# Patient Record
Sex: Male | Born: 1960 | Race: Black or African American | Hispanic: No | Marital: Single | Smoking: Current every day smoker
Health system: Southern US, Community
[De-identification: ages and names within clinical notes are randomized; demographics above are authoritative.]

## PROBLEM LIST (undated history)

## (undated) DIAGNOSIS — R569 Unspecified convulsions: Secondary | ICD-10-CM

## (undated) DIAGNOSIS — Z931 Gastrostomy status: Secondary | ICD-10-CM

## (undated) DIAGNOSIS — E46 Unspecified protein-calorie malnutrition: Secondary | ICD-10-CM

## (undated) DIAGNOSIS — M75101 Unspecified rotator cuff tear or rupture of right shoulder, not specified as traumatic: Secondary | ICD-10-CM

## (undated) DIAGNOSIS — Z7901 Long term (current) use of anticoagulants: Secondary | ICD-10-CM

## (undated) DIAGNOSIS — D649 Anemia, unspecified: Secondary | ICD-10-CM

## (undated) DIAGNOSIS — E785 Hyperlipidemia, unspecified: Secondary | ICD-10-CM

## (undated) DIAGNOSIS — J449 Chronic obstructive pulmonary disease, unspecified: Secondary | ICD-10-CM

## (undated) DIAGNOSIS — Z720 Tobacco use: Secondary | ICD-10-CM

## (undated) DIAGNOSIS — I700 Atherosclerosis of aorta: Secondary | ICD-10-CM

## (undated) DIAGNOSIS — K219 Gastro-esophageal reflux disease without esophagitis: Secondary | ICD-10-CM

## (undated) DIAGNOSIS — Z9889 Other specified postprocedural states: Secondary | ICD-10-CM

## (undated) DIAGNOSIS — Z9981 Dependence on supplemental oxygen: Secondary | ICD-10-CM

## (undated) DIAGNOSIS — E119 Type 2 diabetes mellitus without complications: Secondary | ICD-10-CM

## (undated) DIAGNOSIS — J45909 Unspecified asthma, uncomplicated: Secondary | ICD-10-CM

## (undated) DIAGNOSIS — R490 Dysphonia: Secondary | ICD-10-CM

## (undated) DIAGNOSIS — Z8669 Personal history of other diseases of the nervous system and sense organs: Secondary | ICD-10-CM

## (undated) DIAGNOSIS — R7303 Prediabetes: Secondary | ICD-10-CM

## (undated) DIAGNOSIS — I82409 Acute embolism and thrombosis of unspecified deep veins of unspecified lower extremity: Secondary | ICD-10-CM

## (undated) DIAGNOSIS — M7581 Other shoulder lesions, right shoulder: Secondary | ICD-10-CM

## (undated) DIAGNOSIS — R0600 Dyspnea, unspecified: Secondary | ICD-10-CM

## (undated) DIAGNOSIS — R911 Solitary pulmonary nodule: Secondary | ICD-10-CM

## (undated) HISTORY — PX: PEG TUBE REMOVAL: SHX2187

## (undated) HISTORY — PX: COLONOSCOPY: SHX5424

## (undated) HISTORY — PX: CHEST TUBE INSERTION: SHX231

---

## 2020-07-24 LAB — COLOGUARD: COLOGUARD: NEGATIVE

## 2021-06-20 ENCOUNTER — Encounter: Payer: Self-pay | Admitting: Orthopaedic Surgery

## 2021-07-11 ENCOUNTER — Ambulatory Visit: Payer: Medicare Other

## 2021-07-11 ENCOUNTER — Encounter: Payer: Self-pay | Admitting: Orthopedic Surgery

## 2021-07-11 ENCOUNTER — Ambulatory Visit (INDEPENDENT_AMBULATORY_CARE_PROVIDER_SITE_OTHER): Payer: Medicare Other | Admitting: Orthopedic Surgery

## 2021-07-11 ENCOUNTER — Other Ambulatory Visit: Payer: Self-pay

## 2021-07-11 VITALS — BP 118/73 | HR 89 | Ht 72.0 in | Wt 133.0 lb

## 2021-07-11 DIAGNOSIS — M25511 Pain in right shoulder: Secondary | ICD-10-CM | POA: Diagnosis not present

## 2021-07-11 DIAGNOSIS — G8929 Other chronic pain: Secondary | ICD-10-CM | POA: Diagnosis not present

## 2021-07-11 NOTE — Patient Instructions (Addendum)

## 2021-07-12 ENCOUNTER — Encounter: Payer: Self-pay | Admitting: Orthopedic Surgery

## 2021-07-12 NOTE — Progress Notes (Addendum)
New Patient Visit  Assessment: Benjamin Bolton is a 60 y.o. male with the following: 1. Chronic right shoulder pain  Plan: Patient's had progressively worsening pain in the right shoulder for several months.  He has had multiple injections, but I believe these to be intramuscular steroid injections.  These are provided him with 1-2 days of relief.  He has limited range of motion as a result.  We discussed the possibility of proceeding with an intra-articular steroid injection, and he is elected to proceed.  If he does not fact have an injury to his rotator cuff tendons, we will have to seriously consider proceeding with an MRI as he is a high risk surgical candidate.  In clinic today, he is on chronic oxygen therapy due to COPD.  All questions were answered, and he is amenable to this plan.  He is hopeful that the injection will provide him with sustained relief.  Procedure note injection - Right shoulder    Verbal consent was obtained to inject the right shoulder, subacromial space Timeout was completed to confirm the site of injection.   The skin was prepped with alcohol and ethyl chloride was sprayed at the injection site.  A 21-gauge needle was used to inject 40 mg of Depo-Medrol and 1% lidocaine (3 cc) into the subacromial space of the right shoulder using a posterolateral approach.  There were no complications.  A sterile bandage was applied.      Follow-up: Return if symptoms worsen or fail to improve.  Subjective:  Chief Complaint  Patient presents with   Shoulder Pain    Rt shoulder pain getting worse over the past 7 months. NKI    History of Present Illness: Benjamin Bolton is a 60 y.o. male who presents for evaluation of right shoulder pain.  He has been referred to clinic today by Sharlene Dory, NP.  Pain has been progressively worsening over the past 7 months.  No specific injury.  Is difficult to be with overhead motion.  His pain gets worse at night.  Pain is in the  lateral aspect of the shoulder, and radiates distally.  He is trying to over-the-counter medications, limited improvement in his symptoms.  He has had multiple injections, which I believe are intramuscular, which provided limited relief for 1-2 days.   Review of Systems: No fevers or chills No numbness or tingling No chest pain No bowel or bladder dysfunction No GI distress No headaches   Medical History:  COPD.  Chronic oxygen therapy  History reviewed. No pertinent surgical history.  History reviewed. No pertinent family history. Social History   Tobacco Use   Smoking status: Former    Types: Cigarettes   Smokeless tobacco: Never    No Known Allergies  Current Meds  Medication Sig   albuterol (VENTOLIN HFA) 108 (90 Base) MCG/ACT inhaler Inhale into the lungs.   fluticasone-salmeterol (ADVAIR) 250-50 MCG/ACT AEPB Inhale into the lungs.   tiotropium (SPIRIVA) 18 MCG inhalation capsule Place into inhaler and inhale.    Objective: BP 118/73   Pulse 89   Ht 6' (1.829 m)   Wt 133 lb (60.3 kg)   BMI 18.04 kg/m   Physical Exam:  General: Alert and oriented. and No acute distress. Gait: Normal gait.  Right shoulder without deformity.  No obvious atrophy.  Limited forward elevation to 90 degrees.  Abduction is side to 75 degrees.  He has difficulty getting his arm to his lower back.  Pain with strength testing.  Tenderness palpation  over the lateral shoulder.  IMAGING: I personally ordered and reviewed the following images  X-rays of the right shoulder were obtained in clinic today and demonstrates no acute injury.  There are some degenerative changes noted within the glenohumeral joint.  Small osteophytes in the inferior aspect of the joint.  No proximal humeral migration.  No evidence of chronic rotator cuff injury.  Impression: Right shoulder x-ray with mild degenerative changes.   New Medications:  No orders of the defined types were placed in this  encounter.     Oliver Barre, MD  07/12/2021 1:04 PM

## 2021-07-20 ENCOUNTER — Ambulatory Visit: Payer: Medicare Other | Admitting: Orthopedic Surgery

## 2021-07-25 ENCOUNTER — Other Ambulatory Visit: Payer: Self-pay

## 2021-07-25 ENCOUNTER — Encounter: Payer: Self-pay | Admitting: Orthopedic Surgery

## 2021-07-25 ENCOUNTER — Ambulatory Visit (INDEPENDENT_AMBULATORY_CARE_PROVIDER_SITE_OTHER): Payer: Medicare Other | Admitting: Orthopedic Surgery

## 2021-07-25 VITALS — BP 104/72 | HR 85 | Ht 72.0 in | Wt 133.0 lb

## 2021-07-25 DIAGNOSIS — M25511 Pain in right shoulder: Secondary | ICD-10-CM

## 2021-07-25 DIAGNOSIS — G8929 Other chronic pain: Secondary | ICD-10-CM | POA: Diagnosis not present

## 2021-07-25 MED ORDER — CYCLOBENZAPRINE HCL 10 MG PO TABS: 10.0000 mg | ORAL_TABLET | Freq: Two times a day (BID) | ORAL | 0 refills | Status: DC | PRN

## 2021-07-25 NOTE — Patient Instructions (Signed)

## 2021-07-25 NOTE — Progress Notes (Signed)
Orthopaedic Clinic Return  Assessment: Benjamin Bolton is a 60 y.o. male with the following: Chronic right shoulder pain; rotator cuff injury versus glenohumeral arthritis  Plan: Patient was seen in clinic approximately 2 weeks ago, at which time he had a subacromial steroid injection.  He noted improvement in his discomfort for 1-2 days.  Since then, his pain is significantly worsened.  He is in severe pain, and is requesting something be done.  I discussed our limitations, and our options.  After discussing this, he is interested in proceeding with an ultrasound-guided injection.  This was completed in clinic today.  Have also provided him with Flexeril to help with the muscle spasms, particularly to address the pain in the posterior aspect of his shoulder.  Once again, I stressed that he is a very high risk surgical candidate.  He may in fact have pathology which would be amenable to surgery, but we have to be very cautious in proceeding with surgery.  He would need clearance due to his advanced COPD, requiring oxygen at baseline.  He stated his understanding.  Procedure note injection - Right shoulder, ultrasound guidance   Verbal consent was obtained to inject the right shoulder, glenohumeral joint  Timeout was completed to confirm the site of injection.   Using the ultrasound, the rotator cuff tendons were identified.  The joint space was also identified. The skin was prepped with alcohol and ethyl chloride was sprayed at the injection site.  A 21-gauge needle was used to inject 40 mg of Depo-Medrol and 1% lidocaine (6 cc) into the glenohumeral joint space of the right shoulder using a posterolateral approach.  The needle was visualized entering the glenohumeral joint, and the medication was also visualized. There were no complications.  A sterile bandage was applied.   Note: In order to accurately identify the placement of the needle, ultrasound was required, to increase the accuracy, and  specificity of the injection.   Follow-up: Return if symptoms worsen or fail to improve.   Subjective:  Chief Complaint  Patient presents with   Shoulder Pain    Rt shoulder states pain has gotten worse and the shot only last 1-2 days.     History of Present Illness: Benjamin Bolton is a 60 y.o. male who returns to clinic for repeat evaluation of right shoulder pain.  He was seen in clinic approximately 2 weeks ago.  At that time, he had a subacromial steroid injection.  He noted improvement in his pain for 1-2 days.  Since then, his pain has progressively worsened.  He states his pain is worse now than it was when he came here previously.  He has been taking ibuprofen, with minimal relief in his symptoms.  He has some oxycodone from the emergency department, which he is taking occasionally.  Review of Systems: No fevers or chills No numbness or tingling No chest pain No bowel or bladder dysfunction No GI distress No headaches Shortness of breath, on oxygen at baseline  Objective: BP 104/72   Pulse 85   Ht 6' (1.829 m)   Wt 133 lb (60.3 kg)   BMI 18.04 kg/m  - Physical Exam:  Thin male.  No acute distress.  O2 therapy via nasal cannula.  Restricted right shoulder range of motion.  Pain with minimal motion.  Abduction to 70 degrees.  Forward flexion to 90 degrees.  Internal rotation to the side of his hip.  External rotation limited to 20 degrees.  IMAGING: I personally ordered and reviewed  the following images:  No new imaging obtained today.  Oliver Barre, MD 07/25/2021 11:06 AM

## 2021-07-30 ENCOUNTER — Telehealth: Payer: Self-pay | Admitting: Orthopedic Surgery

## 2021-07-30 MED ORDER — TRAMADOL HCL 50 MG PO TABS: 50.0000 mg | ORAL_TABLET | Freq: Four times a day (QID) | ORAL | 0 refills | Status: DC | PRN

## 2021-07-30 NOTE — Telephone Encounter (Signed)
Patient asking if he may have medication prescribed for his shoulder pain - states that the injection has not helped with the pain. If so, states uses [facility] on [address], [place]

## 2021-08-15 ENCOUNTER — Telehealth: Payer: Self-pay | Admitting: Orthopedic Surgery

## 2021-08-15 MED ORDER — TRAMADOL HCL 50 MG PO TABS: 50.0000 mg | ORAL_TABLET | Freq: Four times a day (QID) | ORAL | 0 refills | Status: DC | PRN

## 2021-08-15 NOTE — Telephone Encounter (Signed)
Patient called for refill - said shoulder is still hurting - no follow up visit scheduled per last visit's instructions unless symptoms worsen or fail to improve: Please advise.   traMADol (ULTRAM) 50 MG tablet 30 tablet     [facility], [place], [address]

## 2021-09-02 DIAGNOSIS — J189 Pneumonia, unspecified organism: Secondary | ICD-10-CM

## 2021-09-02 HISTORY — DX: Pneumonia, unspecified organism: J18.9

## 2021-09-11 ENCOUNTER — Other Ambulatory Visit: Payer: Self-pay | Admitting: Physical Medicine & Rehabilitation

## 2021-09-11 DIAGNOSIS — M542 Cervicalgia: Secondary | ICD-10-CM

## 2021-09-20 ENCOUNTER — Other Ambulatory Visit: Payer: Self-pay

## 2021-09-20 ENCOUNTER — Ambulatory Visit
Admission: RE | Admit: 2021-09-20 | Discharge: 2021-09-20 | Disposition: A | Discharge status: Home / Self Care | Payer: Medicare Other | Source: Ambulatory Visit | Attending: Physical Medicine & Rehabilitation | Admitting: Physical Medicine & Rehabilitation

## 2021-09-20 DIAGNOSIS — M542 Cervicalgia: Secondary | ICD-10-CM | POA: Insufficient documentation

## 2021-11-13 ENCOUNTER — Other Ambulatory Visit: Payer: Self-pay | Admitting: Specialist

## 2021-11-13 ENCOUNTER — Other Ambulatory Visit (HOSPITAL_COMMUNITY): Payer: Self-pay | Admitting: Specialist

## 2021-11-13 DIAGNOSIS — R911 Solitary pulmonary nodule: Secondary | ICD-10-CM

## 2021-11-13 DIAGNOSIS — R0609 Other forms of dyspnea: Secondary | ICD-10-CM

## 2021-12-03 ENCOUNTER — Encounter (HOSPITAL_COMMUNITY): Payer: Medicare Other

## 2021-12-03 ENCOUNTER — Encounter (HOSPITAL_COMMUNITY): Payer: Self-pay

## 2022-01-03 ENCOUNTER — Ambulatory Visit (HOSPITAL_COMMUNITY)
Admission: RE | Admit: 2022-01-03 | Discharge: 2022-01-03 | Disposition: A | Discharge status: Home / Self Care | Payer: Medicare Other | Source: Ambulatory Visit | Attending: Specialist | Admitting: Specialist

## 2022-01-03 DIAGNOSIS — R0609 Other forms of dyspnea: Secondary | ICD-10-CM | POA: Diagnosis present

## 2022-01-03 DIAGNOSIS — R911 Solitary pulmonary nodule: Secondary | ICD-10-CM | POA: Insufficient documentation

## 2022-01-03 LAB — GLUCOSE, CAPILLARY: Glucose-Capillary: 147 mg/dL — ABNORMAL HIGH (ref 70–99)

## 2022-01-03 MED ORDER — FLUDEOXYGLUCOSE F - 18 (FDG) INJECTION
7.0000 | Freq: Once | INTRAVENOUS | Status: AC | PRN
  Administered 2022-01-03: 6.6 via INTRAVENOUS

## 2022-05-29 ENCOUNTER — Other Ambulatory Visit
Admission: RE | Admit: 2022-05-29 | Discharge: 2022-05-29 | Disposition: A | Discharge status: Home / Self Care | Payer: Medicare Other | Source: Ambulatory Visit | Attending: Specialist | Admitting: Specialist

## 2022-05-29 DIAGNOSIS — R053 Chronic cough: Secondary | ICD-10-CM | POA: Diagnosis present

## 2022-05-29 DIAGNOSIS — R0789 Other chest pain: Secondary | ICD-10-CM | POA: Diagnosis present

## 2022-05-29 DIAGNOSIS — J4541 Moderate persistent asthma with (acute) exacerbation: Secondary | ICD-10-CM | POA: Insufficient documentation

## 2022-05-29 LAB — D-DIMER, QUANTITATIVE: D-Dimer, Quant: 0.35 ug/mL-FEU (ref 0.00–0.50)

## 2022-11-19 ENCOUNTER — Other Ambulatory Visit: Payer: Self-pay

## 2022-11-19 DIAGNOSIS — M7581 Other shoulder lesions, right shoulder: Secondary | ICD-10-CM

## 2023-01-01 ENCOUNTER — Ambulatory Visit
Admission: RE | Admit: 2023-01-01 | Discharge: 2023-01-01 | Disposition: A | Discharge status: Home / Self Care | Payer: 59 | Source: Ambulatory Visit | Attending: Physician Assistant | Admitting: Physician Assistant

## 2023-01-01 DIAGNOSIS — M7581 Other shoulder lesions, right shoulder: Secondary | ICD-10-CM | POA: Diagnosis present

## 2023-02-05 ENCOUNTER — Other Ambulatory Visit: Payer: Self-pay | Admitting: Surgery

## 2023-02-12 ENCOUNTER — Encounter
Admission: RE | Admit: 2023-02-12 | Discharge: 2023-02-12 | Disposition: A | Discharge status: Home / Self Care | Payer: 59 | Source: Ambulatory Visit | Attending: Surgery | Admitting: Surgery

## 2023-02-12 ENCOUNTER — Encounter: Admission: RE | Admit: 2023-02-12 | Payer: 59 | Source: Ambulatory Visit

## 2023-02-12 VITALS — Ht 73.0 in | Wt 128.0 lb

## 2023-02-12 DIAGNOSIS — Z01810 Encounter for preprocedural cardiovascular examination: Secondary | ICD-10-CM

## 2023-02-12 DIAGNOSIS — J449 Chronic obstructive pulmonary disease, unspecified: Secondary | ICD-10-CM

## 2023-02-12 HISTORY — DX: Dependence on supplemental oxygen: Z99.81

## 2023-02-12 HISTORY — DX: Solitary pulmonary nodule: R91.1

## 2023-02-12 HISTORY — DX: Personal history of other diseases of the nervous system and sense organs: Z86.69

## 2023-02-12 HISTORY — DX: Tobacco use: Z72.0

## 2023-02-12 HISTORY — DX: Gastro-esophageal reflux disease without esophagitis: K21.9

## 2023-02-12 HISTORY — DX: Unspecified rotator cuff tear or rupture of right shoulder, not specified as traumatic: M75.101

## 2023-02-12 HISTORY — DX: Prediabetes: R73.03

## 2023-02-12 HISTORY — DX: Chronic obstructive pulmonary disease, unspecified: J44.9

## 2023-02-12 HISTORY — DX: Dyspnea, unspecified: R06.00

## 2023-02-12 NOTE — Patient Instructions (Signed)
Your procedure is scheduled on:02-18-23 Tuesday Report to the Registration Desk on the 1st floor of the [facility].Then proceed to the 2nd floor Surgery Desk To find out your arrival time, please call [phone] between 1PM - 3PM on:02-17-23 Monday If your arrival time is 6:00 am, do not arrive before that time as the [facility] entrance doors do not open until 6:00 am.  REMEMBER: Instructions that are not followed completely may result in serious medical risk, up to and including death; or upon the discretion of your surgeon and anesthesiologist your surgery may need to be rescheduled.  Do not eat food after midnight the night before surgery.  No gum chewing or hard candies.  You may however, drink CLEAR liquids up to 2 hours before you are scheduled to arrive for your surgery. Do not drink anything within 2 hours of your scheduled arrival time.  Clear liquids include: - water  - apple juice without pulp - gatorade (not RED colors) - black coffee or tea (Do NOT add milk or creamers to the coffee or tea) Do NOT drink anything that is not on this list.  In addition, your doctor has ordered for you to drink the provided:  Ensure Pre-Surgery Clear Carbohydrate Drink  Drinking this carbohydrate drink up to two hours before surgery helps to reduce insulin resistance and improve patient outcomes. Please complete drinking 2 hours before scheduled arrival time.  One week prior to surgery: Stop Anti-inflammatories (NSAIDS) such as Advil, Aleve, Ibuprofen, Motrin, Naproxen, Naprosyn and Aspirin based products such as Excedrin, Goody's Powder, BC Powder.You may however, take Tylenol if needed for pain up until the day of surgery. Stop ANY OVER THE COUNTER supplements/vitamins NOW (02-12-23) until after surgery (Multivitamin)  TAKE ONLY THESE MEDICATIONS THE MORNING OF SURGERY WITH A SIP OF WATER: -predniSONE (DELTASONE)   Use your Albuterol Nebulizer and your TRELEGY ELLIPTA Inhaler the day  of surgery  No Alcohol for 24 hours before or after surgery.  No Smoking including e-cigarettes for 24 hours before surgery.  No chewable tobacco products for at least 6 hours before surgery.  No nicotine patches on the day of surgery.  Do not use any "recreational" drugs for at least a week (preferably 2 weeks) before your surgery.  Please be advised that the combination of cocaine and anesthesia may have negative outcomes, up to and including death. If you test positive for cocaine, your surgery will be cancelled.  On the morning of surgery brush your teeth with toothpaste and water, you may rinse your mouth with mouthwash if you wish. Do not swallow any toothpaste or mouthwash.  Use CHG Soap as directed on instruction sheet.  Do not wear jewelry, make-up, hairpins, clips or nail polish.  Do not wear lotions, powders, or perfumes.   Do not shave body hair from the neck down 48 hours before surgery.  Contact lenses, hearing aids and dentures may not be worn into surgery.  Do not bring valuables to the hospital. [facility] is not responsible for any missing/lost belongings or valuables.  Notify your doctor if there is any change in your medical condition (cold, fever, infection).  Wear comfortable clothing (specific to your surgery type) to the hospital.  After surgery, you can help prevent lung complications by doing breathing exercises.  Take deep breaths and cough every 1-2 hours. Your doctor may order a device called an Incentive Spirometer to help you take deep breaths. When coughing or sneezing, hold a pillow firmly against your  incision with both hands. This is called "splinting." Doing this helps protect your incision. It also decreases belly discomfort.  If you are being admitted to the hospital overnight, leave your suitcase in the car. After surgery it may be brought to your room.  In case of increased patient census, it may be necessary for you, the patient, to  continue your postoperative care in the Same Day Surgery department.  If you are being discharged the day of surgery, you will not be allowed to drive home. You will need a responsible individual to drive you home and stay with you for 24 hours after surgery.   If you are taking public transportation, you will need to have a responsible individual with you.  Please call the Pre-admissions Testing Dept. at [phone] if you have any questions about these instructions.  Surgery Visitation Policy:  Patients having surgery or a procedure may have two visitors.  Children under the age of 5 must have an adult with them who is not the patient.  Inpatient Visitation:    Visiting hours are 7 a.m. to 8 p.m. Up to four visitors are allowed at one time in a patient room. The visitors may rotate out with other people during the day.  One visitor age 70 or older may stay with the patient overnight and must be in the room by 8 p.m.     Preparing for Surgery with CHLORHEXIDINE GLUCONATE (CHG) Soap  Chlorhexidine Gluconate (CHG) Soap  o An antiseptic cleaner that kills germs and bonds with the skin to continue killing germs even after washing  o Used for showering the night before surgery and morning of surgery  Before surgery, you can play an important role by reducing the number of germs on your skin.  CHG (Chlorhexidine gluconate) soap is an antiseptic cleanser which kills germs and bonds with the skin to continue killing germs even after washing.  Please do not use if you have an allergy to CHG or antibacterial soaps. If your skin becomes reddened/irritated stop using the CHG.  1. Shower the NIGHT BEFORE SURGERY and the MORNING OF SURGERY with CHG soap.  2. If you choose to wash your hair, wash your hair first as usual with your normal shampoo.  3. After shampooing, rinse your hair and body thoroughly to remove the shampoo.  4. Use CHG as you would any other liquid soap. You can  apply CHG directly to the skin and wash gently with a scrungie or a clean washcloth.  5. Apply the CHG soap to your body only from the neck down. Do not use on open wounds or open sores. Avoid contact with your eyes, ears, mouth, and genitals (private parts). Wash face and genitals (private parts) with your normal soap.  6. Wash thoroughly, paying special attention to the area where your surgery will be performed.  7. Thoroughly rinse your body with warm water.  8. Do not shower/wash with your normal soap after using and rinsing off the CHG soap.  9. Pat yourself dry with a clean towel.  10. Wear clean pajamas to bed the night before surgery.  12. Place clean sheets on your bed the night of your first shower and do not sleep with pets.  13. Shower again with the CHG soap on the day of surgery prior to arriving at the hospital.  14. Do not apply any deodorants/lotions/powders.  15. Please wear clean clothes to the hospital.  How to Use an Incentive Spirometer An  incentive spirometer is a tool that measures how well you are filling your lungs with each breath. Learning to take long, deep breaths using this tool can help you keep your lungs clear and active. This may help to reverse or lessen your chance of developing breathing (pulmonary) problems, especially infection. You may be asked to use a spirometer: After a surgery. If you have a lung problem or a history of smoking. After a long period of time when you have been unable to move or be active. If the spirometer includes an indicator to show the highest number that you have reached, your health care provider or respiratory therapist will help you set a goal. Keep a log of your progress as told by your health care provider. What are the risks? Breathing too quickly may cause dizziness or cause you to pass out. Take your time so you do not get dizzy or light-headed. If you are in pain, you may need to take pain medicine before doing  incentive spirometry. It is harder to take a deep breath if you are having pain. How to use your incentive spirometer  Sit up on the edge of your bed or on a chair. Hold the incentive spirometer so that it is in an upright position. Before you use the spirometer, breathe out normally. Place the mouthpiece in your mouth. Make sure your lips are closed tightly around it. Breathe in slowly and as deeply as you can through your mouth, causing the piston or the ball to rise toward the top of the chamber. Hold your breath for 3-5 seconds, or for as long as possible. If the spirometer includes a coach indicator, use this to guide you in breathing. Slow down your breathing if the indicator goes above the marked areas. Remove the mouthpiece from your mouth and breathe out normally. The piston or ball will return to the bottom of the chamber. Rest for a few seconds, then repeat the steps 10 or more times. Take your time and take a few normal breaths between deep breaths so that you do not get dizzy or light-headed. Do this every 1-2 hours when you are awake. If the spirometer includes a goal marker to show the highest number you have reached (best effort), use this as a goal to work toward during each repetition. After each set of 10 deep breaths, cough a few times. This will help to make sure that your lungs are clear. If you have an incision on your chest or abdomen from surgery, place a pillow or a rolled-up towel firmly against the incision when you cough. This can help to reduce pain while taking deep breaths and coughing. General tips When you are able to get out of bed: Walk around often. Continue to take deep breaths and cough in order to clear your lungs. Keep using the incentive spirometer until your health care provider says it is okay to stop using it. If you have been in the hospital, you may be told to keep using the spirometer at home. Contact a health care provider if: You are having  difficulty using the spirometer. You have trouble using the spirometer as often as instructed. Your pain medicine is not giving enough relief for you to use the spirometer as told. You have a fever. Get help right away if: You develop shortness of breath. You develop a cough with bloody mucus from the lungs. You have fluid or blood coming from an incision site after you cough. Summary An  incentive spirometer is a tool that can help you learn to take long, deep breaths to keep your lungs clear and active. You may be asked to use a spirometer after a surgery, if you have a lung problem or a history of smoking, or if you have been inactive for a long period of time. Use your incentive spirometer as instructed every 1-2 hours while you are awake. If you have an incision on your chest or abdomen, place a pillow or a rolled-up towel firmly against your incision when you cough. This will help to reduce pain. Get help right away if you have shortness of breath, you cough up bloody mucus, or blood comes from your incision when you cough. This information is not intended to replace advice given to you by your health care provider. Make sure you discuss any questions you have with your health care provider. Document Revised: 11/08/2019 Document Reviewed: 11/08/2019 Elsevier Patient Education  2024 [employer].

## 2023-02-17 MED ORDER — FAMOTIDINE 20 MG PO TABS
20.0000 mg | ORAL_TABLET | Freq: Once | ORAL | Status: AC
  Administered 2023-02-18: 20 mg via ORAL

## 2023-02-17 MED ORDER — ORAL CARE MOUTH RINSE: 15.0000 mL | Freq: Once | OROMUCOSAL | Status: AC

## 2023-02-17 MED ORDER — CEFAZOLIN SODIUM-DEXTROSE 2-4 GM/100ML-% IV SOLN
2.0000 g | INTRAVENOUS | Status: AC
  Administered 2023-02-18: 2 g via INTRAVENOUS

## 2023-02-17 MED ORDER — CHLORHEXIDINE GLUCONATE 0.12 % MT SOLN
15.0000 mL | Freq: Once | OROMUCOSAL | Status: AC
  Administered 2023-02-18: 15 mL via OROMUCOSAL

## 2023-02-17 MED ORDER — LACTATED RINGERS IV SOLN: INTRAVENOUS | Status: DC

## 2023-02-18 ENCOUNTER — Ambulatory Visit: Payer: 59 | Admitting: Certified Registered Nurse Anesthetist

## 2023-02-18 ENCOUNTER — Encounter
Admission: RE | Disposition: A | Discharge status: Home / Self Care | Payer: Self-pay | Source: Home / Self Care | Attending: Surgery

## 2023-02-18 ENCOUNTER — Encounter: Payer: Self-pay | Admitting: Surgery

## 2023-02-18 ENCOUNTER — Other Ambulatory Visit: Payer: Self-pay

## 2023-02-18 ENCOUNTER — Ambulatory Visit
Admission: RE | Admit: 2023-02-18 | Discharge: 2023-02-19 | Disposition: A | Discharge status: Home / Self Care | Payer: 59 | Attending: Surgery | Admitting: Surgery

## 2023-02-18 DIAGNOSIS — M75121 Complete rotator cuff tear or rupture of right shoulder, not specified as traumatic: Secondary | ICD-10-CM

## 2023-02-18 DIAGNOSIS — Z833 Family history of diabetes mellitus: Secondary | ICD-10-CM | POA: Diagnosis not present

## 2023-02-18 DIAGNOSIS — Z01810 Encounter for preprocedural cardiovascular examination: Secondary | ICD-10-CM

## 2023-02-18 DIAGNOSIS — M67813 Other specified disorders of tendon, right shoulder: Secondary | ICD-10-CM | POA: Diagnosis not present

## 2023-02-18 DIAGNOSIS — Z9981 Dependence on supplemental oxygen: Secondary | ICD-10-CM | POA: Insufficient documentation

## 2023-02-18 DIAGNOSIS — Z9889 Other specified postprocedural states: Secondary | ICD-10-CM

## 2023-02-18 DIAGNOSIS — J432 Centrilobular emphysema: Secondary | ICD-10-CM | POA: Insufficient documentation

## 2023-02-18 DIAGNOSIS — R7303 Prediabetes: Secondary | ICD-10-CM | POA: Diagnosis not present

## 2023-02-18 DIAGNOSIS — K219 Gastro-esophageal reflux disease without esophagitis: Secondary | ICD-10-CM | POA: Insufficient documentation

## 2023-02-18 DIAGNOSIS — F17210 Nicotine dependence, cigarettes, uncomplicated: Secondary | ICD-10-CM | POA: Insufficient documentation

## 2023-02-18 DIAGNOSIS — J449 Chronic obstructive pulmonary disease, unspecified: Secondary | ICD-10-CM

## 2023-02-18 DIAGNOSIS — M7541 Impingement syndrome of right shoulder: Secondary | ICD-10-CM | POA: Insufficient documentation

## 2023-02-18 DIAGNOSIS — M7521 Bicipital tendinitis, right shoulder: Secondary | ICD-10-CM | POA: Diagnosis not present

## 2023-02-18 DIAGNOSIS — M7581 Other shoulder lesions, right shoulder: Secondary | ICD-10-CM | POA: Diagnosis not present

## 2023-02-18 DIAGNOSIS — I38 Endocarditis, valve unspecified: Secondary | ICD-10-CM | POA: Diagnosis not present

## 2023-02-18 HISTORY — PX: SHOULDER ARTHROSCOPY WITH SUBACROMIAL DECOMPRESSION, ROTATOR CUFF REPAIR AND BICEP TENDON REPAIR: SHX5687

## 2023-02-18 SURGERY — SHOULDER ARTHROSCOPY WITH SUBACROMIAL DECOMPRESSION, ROTATOR CUFF REPAIR AND BICEP TENDON REPAIR
Anesthesia: General | Site: Shoulder | Laterality: Right

## 2023-02-18 MED ORDER — PREDNISONE 10 MG PO TABS
20.0000 mg | ORAL_TABLET | Freq: Every day | ORAL | Status: DC
  Administered 2023-02-19: 20 mg via ORAL
  Filled 2023-02-18: qty 2

## 2023-02-18 MED ORDER — FLEET ENEMA 7-19 GM/118ML RE ENEM: 1.0000 | ENEMA | Freq: Once | RECTAL | Status: DC | PRN

## 2023-02-18 MED ORDER — SUGAMMADEX SODIUM 200 MG/2ML IV SOLN
INTRAVENOUS | Status: DC | PRN
  Administered 2023-02-18: 200 mg via INTRAVENOUS

## 2023-02-18 MED ORDER — KETOROLAC TROMETHAMINE 15 MG/ML IJ SOLN
INTRAMUSCULAR | Status: AC
  Filled 2023-02-18: qty 1

## 2023-02-18 MED ORDER — BUPIVACAINE HCL (PF) 0.5 % IJ SOLN
INTRAMUSCULAR | Status: AC
  Filled 2023-02-18: qty 30

## 2023-02-18 MED ORDER — OXYCODONE HCL 5 MG PO TABS
5.0000 mg | ORAL_TABLET | ORAL | Status: DC | PRN
  Administered 2023-02-18 – 2023-02-19 (×2): 10 mg via ORAL

## 2023-02-18 MED ORDER — ACETAMINOPHEN 500 MG PO TABS
ORAL_TABLET | ORAL | Status: AC
  Filled 2023-02-18: qty 2

## 2023-02-18 MED ORDER — OXYCODONE HCL 5 MG PO TABS: 5.0000 mg | ORAL_TABLET | Freq: Once | ORAL | Status: DC | PRN

## 2023-02-18 MED ORDER — ONDANSETRON HCL 4 MG/2ML IJ SOLN: 4.0000 mg | Freq: Once | INTRAMUSCULAR | Status: DC | PRN

## 2023-02-18 MED ORDER — BUPIVACAINE-EPINEPHRINE 0.5% -1:200000 IJ SOLN
INTRAMUSCULAR | Status: DC | PRN
  Administered 2023-02-18: 30 mL

## 2023-02-18 MED ORDER — PHENYLEPHRINE HCL (PRESSORS) 10 MG/ML IV SOLN
INTRAVENOUS | Status: AC
  Filled 2023-02-18: qty 1

## 2023-02-18 MED ORDER — DIPHENHYDRAMINE HCL 12.5 MG/5ML PO ELIX: 12.5000 mg | ORAL_SOLUTION | ORAL | Status: DC | PRN

## 2023-02-18 MED ORDER — ONDANSETRON HCL 4 MG/2ML IJ SOLN
INTRAMUSCULAR | Status: AC
  Filled 2023-02-18: qty 2

## 2023-02-18 MED ORDER — UMECLIDINIUM BROMIDE 62.5 MCG/ACT IN AEPB
1.0000 | INHALATION_SPRAY | Freq: Every day | RESPIRATORY_TRACT | Status: DC
  Administered 2023-02-19: 1 via RESPIRATORY_TRACT
  Filled 2023-02-18: qty 7

## 2023-02-18 MED ORDER — KETAMINE HCL 50 MG/5ML IJ SOSY
PREFILLED_SYRINGE | INTRAMUSCULAR | Status: AC
  Filled 2023-02-18: qty 5

## 2023-02-18 MED ORDER — ACETAMINOPHEN 500 MG PO TABS
1000.0000 mg | ORAL_TABLET | Freq: Four times a day (QID) | ORAL | Status: DC
  Administered 2023-02-18 – 2023-02-19 (×3): 1000 mg via ORAL

## 2023-02-18 MED ORDER — FENTANYL CITRATE (PF) 100 MCG/2ML IJ SOLN
INTRAMUSCULAR | Status: AC
  Filled 2023-02-18: qty 2

## 2023-02-18 MED ORDER — ALBUTEROL SULFATE (2.5 MG/3ML) 0.083% IN NEBU: 2.5000 mg | INHALATION_SOLUTION | Freq: Four times a day (QID) | RESPIRATORY_TRACT | Status: DC | PRN

## 2023-02-18 MED ORDER — LACTATED RINGERS IV SOLN: INTRAVENOUS | Status: DC

## 2023-02-18 MED ORDER — METOCLOPRAMIDE HCL 5 MG/ML IJ SOLN: 5.0000 mg | Freq: Three times a day (TID) | INTRAMUSCULAR | Status: DC | PRN

## 2023-02-18 MED ORDER — ONDANSETRON HCL 4 MG PO TABS: 4.0000 mg | ORAL_TABLET | Freq: Four times a day (QID) | ORAL | Status: DC | PRN

## 2023-02-18 MED ORDER — ALBUTEROL SULFATE HFA 108 (90 BASE) MCG/ACT IN AERS
INHALATION_SPRAY | RESPIRATORY_TRACT | Status: AC
  Filled 2023-02-18: qty 6.7

## 2023-02-18 MED ORDER — KETOROLAC TROMETHAMINE 15 MG/ML IJ SOLN
15.0000 mg | Freq: Once | INTRAMUSCULAR | Status: AC
  Administered 2023-02-18: 15 mg via INTRAVENOUS

## 2023-02-18 MED ORDER — DEXAMETHASONE SODIUM PHOSPHATE 10 MG/ML IJ SOLN
INTRAMUSCULAR | Status: AC
  Filled 2023-02-18: qty 1

## 2023-02-18 MED ORDER — EPHEDRINE SULFATE (PRESSORS) 50 MG/ML IJ SOLN
INTRAMUSCULAR | Status: DC | PRN
  Administered 2023-02-18: 5 mg via INTRAVENOUS

## 2023-02-18 MED ORDER — BUPIVACAINE LIPOSOME 1.3 % IJ SUSP
INTRAMUSCULAR | Status: AC
  Filled 2023-02-18: qty 20

## 2023-02-18 MED ORDER — ACETAMINOPHEN 10 MG/ML IV SOLN
INTRAVENOUS | Status: DC | PRN
  Administered 2023-02-18: 1000 mg via INTRAVENOUS

## 2023-02-18 MED ORDER — PHENYLEPHRINE 80 MCG/ML (10ML) SYRINGE FOR IV PUSH (FOR BLOOD PRESSURE SUPPORT)
PREFILLED_SYRINGE | INTRAVENOUS | Status: AC
  Filled 2023-02-18: qty 10

## 2023-02-18 MED ORDER — FAMOTIDINE 20 MG PO TABS
ORAL_TABLET | ORAL | Status: AC
  Filled 2023-02-18: qty 1

## 2023-02-18 MED ORDER — ONDANSETRON HCL 4 MG/2ML IJ SOLN
4.0000 mg | Freq: Four times a day (QID) | INTRAMUSCULAR | Status: DC | PRN
  Administered 2023-02-18: 4 mg via INTRAVENOUS

## 2023-02-18 MED ORDER — ONDANSETRON HCL 4 MG/2ML IJ SOLN
INTRAMUSCULAR | Status: DC | PRN
  Administered 2023-02-18: 4 mg via INTRAVENOUS

## 2023-02-18 MED ORDER — ADULT MULTIVITAMIN W/MINERALS CH
1.0000 | ORAL_TABLET | ORAL | Status: DC
  Administered 2023-02-19: 1 via ORAL

## 2023-02-18 MED ORDER — SODIUM CHLORIDE 0.9 % IV SOLN: INTRAVENOUS | Status: DC

## 2023-02-18 MED ORDER — DOCUSATE SODIUM 100 MG PO CAPS
100.0000 mg | ORAL_CAPSULE | Freq: Two times a day (BID) | ORAL | Status: DC
  Administered 2023-02-18: 100 mg via ORAL

## 2023-02-18 MED ORDER — DEXMEDETOMIDINE HCL IN NACL 80 MCG/20ML IV SOLN
INTRAVENOUS | Status: DC | PRN
  Administered 2023-02-18: 8 ug via INTRAVENOUS

## 2023-02-18 MED ORDER — PHENYLEPHRINE HCL (PRESSORS) 10 MG/ML IV SOLN
INTRAVENOUS | Status: DC | PRN
  Administered 2023-02-18: 80 ug via INTRAVENOUS

## 2023-02-18 MED ORDER — CHLORHEXIDINE GLUCONATE 0.12 % MT SOLN
OROMUCOSAL | Status: AC
  Filled 2023-02-18: qty 15

## 2023-02-18 MED ORDER — 0.9 % SODIUM CHLORIDE (POUR BTL) OPTIME
TOPICAL | Status: DC | PRN
  Administered 2023-02-18: 500 mL

## 2023-02-18 MED ORDER — FENTANYL CITRATE (PF) 100 MCG/2ML IJ SOLN
25.0000 ug | INTRAMUSCULAR | Status: DC | PRN
  Administered 2023-02-18 (×4): 25 ug via INTRAVENOUS

## 2023-02-18 MED ORDER — EPINEPHRINE PF 1 MG/ML IJ SOLN
INTRAMUSCULAR | Status: AC
  Filled 2023-02-18: qty 1

## 2023-02-18 MED ORDER — FLUTICASONE FUROATE-VILANTEROL 100-25 MCG/ACT IN AEPB
1.0000 | INHALATION_SPRAY | Freq: Every day | RESPIRATORY_TRACT | Status: DC
  Administered 2023-02-19: 1 via RESPIRATORY_TRACT
  Filled 2023-02-18: qty 28

## 2023-02-18 MED ORDER — KETAMINE HCL 10 MG/ML IJ SOLN
INTRAMUSCULAR | Status: DC | PRN
  Administered 2023-02-18: 20 mg via INTRAVENOUS
  Administered 2023-02-18 (×2): 10 mg via INTRAVENOUS

## 2023-02-18 MED ORDER — ACETAMINOPHEN 10 MG/ML IV SOLN
INTRAVENOUS | Status: AC
  Filled 2023-02-18: qty 100

## 2023-02-18 MED ORDER — ROCURONIUM BROMIDE 100 MG/10ML IV SOLN
INTRAVENOUS | Status: DC | PRN
  Administered 2023-02-18: 30 mg via INTRAVENOUS
  Administered 2023-02-18: 20 mg via INTRAVENOUS
  Administered 2023-02-18: 10 mg via INTRAVENOUS

## 2023-02-18 MED ORDER — ACETAMINOPHEN 10 MG/ML IV SOLN: 1000.0000 mg | Freq: Once | INTRAVENOUS | Status: DC | PRN

## 2023-02-18 MED ORDER — MIDAZOLAM HCL 2 MG/2ML IJ SOLN
INTRAMUSCULAR | Status: AC
  Filled 2023-02-18: qty 2

## 2023-02-18 MED ORDER — MAGNESIUM HYDROXIDE 400 MG/5ML PO SUSP: 30.0000 mL | Freq: Every day | ORAL | Status: DC | PRN

## 2023-02-18 MED ORDER — CEFAZOLIN SODIUM-DEXTROSE 2-4 GM/100ML-% IV SOLN
INTRAVENOUS | Status: AC
  Filled 2023-02-18: qty 100

## 2023-02-18 MED ORDER — ROCURONIUM BROMIDE 10 MG/ML (PF) SYRINGE
PREFILLED_SYRINGE | INTRAVENOUS | Status: AC
  Filled 2023-02-18: qty 10

## 2023-02-18 MED ORDER — OXYCODONE HCL 5 MG/5ML PO SOLN: 5.0000 mg | Freq: Once | ORAL | Status: DC | PRN

## 2023-02-18 MED ORDER — OXYCODONE HCL 5 MG PO TABS
ORAL_TABLET | ORAL | Status: AC
  Filled 2023-02-18: qty 2

## 2023-02-18 MED ORDER — BISACODYL 10 MG RE SUPP: 10.0000 mg | Freq: Every day | RECTAL | Status: DC | PRN

## 2023-02-18 MED ORDER — LIDOCAINE HCL (CARDIAC) PF 100 MG/5ML IV SOSY
PREFILLED_SYRINGE | INTRAVENOUS | Status: DC | PRN
  Administered 2023-02-18: 40 mg via INTRAVENOUS

## 2023-02-18 MED ORDER — DEXAMETHASONE SODIUM PHOSPHATE 10 MG/ML IJ SOLN
INTRAMUSCULAR | Status: DC | PRN
  Administered 2023-02-18: 10 mg via INTRAVENOUS

## 2023-02-18 MED ORDER — MIDAZOLAM HCL 2 MG/2ML IJ SOLN
INTRAMUSCULAR | Status: DC | PRN
  Administered 2023-02-18: 2 mg via INTRAVENOUS

## 2023-02-18 MED ORDER — DOCUSATE SODIUM 100 MG PO CAPS
ORAL_CAPSULE | ORAL | Status: AC
  Filled 2023-02-18: qty 1

## 2023-02-18 MED ORDER — METOCLOPRAMIDE HCL 10 MG PO TABS: 5.0000 mg | ORAL_TABLET | Freq: Three times a day (TID) | ORAL | Status: DC | PRN

## 2023-02-18 MED ORDER — KETOROLAC TROMETHAMINE 15 MG/ML IJ SOLN
7.5000 mg | Freq: Four times a day (QID) | INTRAMUSCULAR | Status: DC
  Administered 2023-02-18 – 2023-02-19 (×2): 7.5 mg via INTRAVENOUS

## 2023-02-18 MED ORDER — CEFAZOLIN SODIUM-DEXTROSE 2-4 GM/100ML-% IV SOLN
2.0000 g | Freq: Four times a day (QID) | INTRAVENOUS | Status: AC
  Administered 2023-02-18 – 2023-02-19 (×3): 2 g via INTRAVENOUS

## 2023-02-18 MED ORDER — LACTATED RINGERS IV SOLN
INTRAVENOUS | Status: DC | PRN
  Administered 2023-02-18: 3001 mL

## 2023-02-18 MED ORDER — PROPOFOL 10 MG/ML IV BOLUS
INTRAVENOUS | Status: DC | PRN
  Administered 2023-02-18: 100 mg via INTRAVENOUS

## 2023-02-18 MED ORDER — FENTANYL CITRATE (PF) 100 MCG/2ML IJ SOLN
INTRAMUSCULAR | Status: DC | PRN
  Administered 2023-02-18: 50 ug via INTRAVENOUS
  Administered 2023-02-18 (×2): 25 ug via INTRAVENOUS

## 2023-02-18 MED ORDER — ACETAMINOPHEN 325 MG PO TABS: 325.0000 mg | ORAL_TABLET | Freq: Four times a day (QID) | ORAL | Status: DC | PRN

## 2023-02-18 MED ORDER — PHENYLEPHRINE HCL-NACL 20-0.9 MG/250ML-% IV SOLN
INTRAVENOUS | Status: DC | PRN
  Administered 2023-02-18: 100 ug/min via INTRAVENOUS

## 2023-02-18 SURGICAL SUPPLY — 61 items
ANCH SUT 2 2.9 2 LD TPR NDL (Anchor) ×1 IMPLANT
ANCH SUT 5.5 KNTLS (Anchor) ×1 IMPLANT
ANCH SUT BN ASCP DLV (Anchor) ×1 IMPLANT
ANCH SUT Q-FX 2.8 (Anchor) ×2 IMPLANT
ANCH SUT RGNRT REGENETEN (Staple) ×1 IMPLANT
ANCHOR ALL-SUT Q-FIX 2.8 (Anchor) IMPLANT
ANCHOR BONE REGENETEN (Anchor) IMPLANT
ANCHOR HEALICOIL REGEN 5.5 (Anchor) IMPLANT
ANCHOR JUGGERKNOT WTAP NDL 2.9 (Anchor) IMPLANT
ANCHOR TENDON REGENETEN (Staple) IMPLANT
APL PRP STRL LF DISP 70% ISPRP (MISCELLANEOUS) ×1
BIT DRILL JUGRKNT W/NDL BIT2.9 (DRILL) IMPLANT
BLADE FULL RADIUS 3.5 (BLADE) ×1 IMPLANT
BUR ACROMIONIZER 4.0 (BURR) ×1 IMPLANT
CANNULA SHAVER 8MMX76MM (CANNULA) ×1 IMPLANT
CHLORAPREP W/TINT 26 (MISCELLANEOUS) ×1 IMPLANT
COVER MAYO STAND REUSABLE (DRAPES) ×1 IMPLANT
DILATOR 5.5 THREADED HEALICOIL (MISCELLANEOUS) IMPLANT
DRILL JUGGERKNOT W/NDL BIT 2.9 (DRILL) ×1
ELECT CAUTERY BLADE 6.4 (BLADE) ×1 IMPLANT
ELECT REM PT RETURN 9FT ADLT (ELECTROSURGICAL) ×1
ELECTRODE REM PT RTRN 9FT ADLT (ELECTROSURGICAL) ×1 IMPLANT
GAUZE SPONGE 4X4 12PLY STRL (GAUZE/BANDAGES/DRESSINGS) ×1 IMPLANT
GAUZE XEROFORM 1X8 LF (GAUZE/BANDAGES/DRESSINGS) ×1 IMPLANT
GLOVE BIO SURGEON STRL SZ7.5 (GLOVE) ×2 IMPLANT
GLOVE BIO SURGEON STRL SZ8 (GLOVE) ×2 IMPLANT
GLOVE BIOGEL PI IND STRL 8 (GLOVE) ×1 IMPLANT
GLOVE INDICATOR 8.0 STRL GRN (GLOVE) ×1 IMPLANT
GOWN STRL REUS W/ TWL LRG LVL3 (GOWN DISPOSABLE) ×1 IMPLANT
GOWN STRL REUS W/ TWL XL LVL3 (GOWN DISPOSABLE) ×1 IMPLANT
GOWN STRL REUS W/TWL LRG LVL3 (GOWN DISPOSABLE) ×1
GOWN STRL REUS W/TWL XL LVL3 (GOWN DISPOSABLE) ×1
GRASPER SUT 15 45D LOW PRO (SUTURE) IMPLANT
IMPL REGENETEN MEDIUM (Shoulder) IMPLANT
IMPLANT REGENETEN MEDIUM (Shoulder) ×1 IMPLANT
IV LACTATED RINGER IRRG 3000ML (IV SOLUTION) ×2
IV LR IRRIG 3000ML ARTHROMATIC (IV SOLUTION) ×2 IMPLANT
KIT CANNULA 8X76-LX IN CANNULA (CANNULA) ×1 IMPLANT
KIT SUTURE 2.8 Q-FIX DISP (MISCELLANEOUS) IMPLANT
MANIFOLD NEPTUNE II (INSTRUMENTS) ×2 IMPLANT
MASK FACE SPIDER DISP (MASK) ×1 IMPLANT
MAT ABSORB FLUID 56X50 GRAY (MISCELLANEOUS) ×1 IMPLANT
PACK ARTHROSCOPY SHOULDER (MISCELLANEOUS) ×1 IMPLANT
PAD ABD DERMACEA PRESS 5X9 (GAUZE/BANDAGES/DRESSINGS) ×2 IMPLANT
PASSER SUT FIRSTPASS SELF (INSTRUMENTS) IMPLANT
SLEEVE REMOTE CONTROL 5X12 (DRAPES) IMPLANT
SLING ARM LRG DEEP (SOFTGOODS) ×1 IMPLANT
SLING ULTRA II LG (MISCELLANEOUS) ×1 IMPLANT
SPONGE T-LAP 18X18 ~~LOC~~+RFID (SPONGE) ×1 IMPLANT
STAPLER SKIN PROX 35W (STAPLE) ×1 IMPLANT
STRAP SAFETY 5IN WIDE (MISCELLANEOUS) ×1 IMPLANT
SUT ETHIBOND 0 MO6 C/R (SUTURE) ×1 IMPLANT
SUT ULTRABRAID 2 COBRAID 38 (SUTURE) IMPLANT
SUT VIC AB 2-0 CT1 27 (SUTURE) ×2
SUT VIC AB 2-0 CT1 TAPERPNT 27 (SUTURE) ×2 IMPLANT
TAPE MICROFOAM 4IN (TAPE) ×1 IMPLANT
TRAP FLUID SMOKE EVACUATOR (MISCELLANEOUS) ×1 IMPLANT
TUBE SET DOUBLEFLO INFLOW (TUBING) ×1 IMPLANT
TUBING CONNECTING 10 (TUBING) ×1 IMPLANT
WAND WEREWOLF FLOW 90D (MISCELLANEOUS) ×1 IMPLANT
WATER STERILE IRR 500ML POUR (IV SOLUTION) ×1 IMPLANT

## 2023-02-18 NOTE — Transfer of Care (Signed)
Immediate Anesthesia Transfer of Care Note  [name]  Procedure(s) Performed: RIGHT SHOULDER ARTHROSCOPY WITH DEBRIDEMENT, DECOMPRESSION, ROTATOR CUFF REPAIR, BICEPS TENODESIS. (Right: Shoulder)  Patient Location: PACU  Anesthesia Type:General  Level of Consciousness: awake and drowsy  Airway & Oxygen Therapy: Patient Spontanous Breathing and Patient connected to face mask oxygen  Post-op Assessment: Report given to RN and Post -op Vital signs reviewed and stable  Post vital signs: Reviewed and stable  Last Vitals:  Vitals Value Taken Time  BP 111/78 02/18/23 1530  Temp 36.6 C 02/18/23 1530  Pulse 80 02/18/23 1533  Resp 18 02/18/23 1533  SpO2 100 % 02/18/23 1533  Vitals shown include unvalidated device data.  Last Pain:  Vitals:   02/18/23 1124  TempSrc: Oral         Complications: No notable events documented.

## 2023-02-18 NOTE — Op Note (Signed)
02/18/2023  3:22 PM  Patient:   Benjamin Bolton  Pre-Op Diagnosis:   Impingement/tendinopathy with large full-thickness rotator cuff tear and biceps tendinopathy, right shoulder.  Post-Op Diagnosis:   Impingement/tendinopathy with large full-thickness rotator cuff tear and biceps tendinopathy, right shoulder.  Procedure:   Limited arthroscopic debridement, arthroscopic subacromial decompression, mini-open rotator cuff repair augmented with Smith & Nephew Regeneten patch, and mini-open biceps tenodesis, right shoulder.  Anesthesia:   GET  Surgeon:   Maryagnes Amos, MD  Assistant:   Horris Latino, PA-C  Findings:   As above. There was a large full-thickness tear involving the middle and posterior portions of the supraspinatus tendon as well as the anterior portion of the infraspinatus tendon. The subscapularis and teres minor both were in excellent condition. The biceps tendon demonstrated moderate "lip sticking" without partial or full-thickness tearing. The labrum was in satisfactory condition, as were the anterior and posterior cruciate ligaments.  Complications:   None  Fluids:   800 cc  Estimated blood loss:   10 cc  Tourniquet time:   None  Drains:   None  Closure:   Staples      Brief clinical note:   The patient is a 62 year old male with a long history of gradually worsening right shoulder pain and weakness. The patient's symptoms have progressed despite medications, activity modification, etc. The patient's history and examination are consistent with impingement/tendinopathy with a rotator cuff tear. These findings were confirmed by MRI scan. The patient presents at this time for definitive management of these shoulder symptoms.  Procedure:   The patient was brought into the operating room and lain in the supine position. After undergoing satisfactory general endotracheal intubation and anesthesia, he was repositioned in the beach chair position using the beach chair  positioner. The right shoulder and upper extremity were prepped with ChloraPrep solution before being draped sterilely. Preoperative antibiotics were administered. A timeout was performed to confirm the proper surgical site before the expected portal sites and incision site were injected with a solution of 10 cc of Exparel and 20 cc of 0.5% Sensorcaine with epinephrine.   A posterior portal was created and the glenohumeral joint thoroughly inspected with the findings as described above. An anterior portal was created using an outside-in technique. The labrum and rotator cuff were further probed, again confirming the above-noted findings. The full-radius dissector was inserted and used to debride areas of synovitis as well as the torn margins of the rotator cuff. The ArthroCare wand was inserted and used to release the biceps tendon from its labral anchor.  It also was used to obtain hemostasis as well as to "anneal" the labrum superiorly and anteriorly. The instruments were removed from the joint after suctioning the excess fluid.  The camera was repositioned through the posterior portal into the subacromial space. A separate lateral portal was created using an outside-in technique. The 3.5 mm full-radius resector was introduced and used to perform a subtotal bursectomy. The ArthroCare wand was then inserted and used to remove the periosteal tissue off the undersurface of the anterior third of the acromion as well as to recess the coracoacromial ligament from its attachment along the anterior and lateral margins of the acromion. The 4.0 mm acromionizing bur was introduced and used to complete the decompression by removing the undersurface of the anterior third of the acromion. The full radius resector was reintroduced to remove any residual bony debris before the ArthroCare wand was reintroduced to obtain hemostasis. The instruments were then removed  from the subacromial space after suctioning the excess  fluid.  An approximately 4-5 cm incision was made over the anterolateral aspect of the shoulder beginning at the anterolateral corner of the acromion and extending distally in line with the bicipital groove. This incision was carried down through the subcutaneous tissues to expose the deltoid fascia. The raphae between the anterior and middle thirds was identified and this plane developed to provide access into the subacromial space. Additional bursal tissues were debrided sharply using Metzenbaum scissors. The rotator cuff tear was readily identified. The margins were debrided sharply with a #15 blade and the exposed greater tuberosity roughened with a rongeur. The longitudinal portion of the tear was repaired using three #2 FiberWire interrupted sutures in a side-to-side fashion before the more lateral portion of the tear was repaired using two Smith & Nephew 2.9 mm Q-Fix anchors. These more lateral sutures were then brought back laterally and secured using two Smith & Nephew Healicoil knotless RegeneSorb anchors to create a two-layer closure. An apparent watertight closure was obtained.  Given the size of the tear and the quality of the tissue, it was felt best to reinforce this repair with a Smith & Nephew Regeneten patch.  Therefore, the medium sized patch was selected and secured over the repair using the appropriate bone and soft tissue staples.  This additional procedure added an extra level of difficulty to the procedure and took an additional 20 to 25 minutes.  The bicipital groove was identified by palpation and opened for 1-1.5 cm. The biceps tendon stump was retrieved through this defect. The floor of the bicipital groove was roughened with a curet before another Biomet 2.9 mm JuggerKnot anchor was inserted. Both sets of sutures were passed through the biceps tendon and tied securely to effect the tenodesis. The bicipital sheath was reapproximated using two #0 Ethibond interrupted sutures,  incorporating the biceps tendon to further reinforce the tenodesis.  The wound was copiously irrigated with sterile saline solution before the deltoid raphae was reapproximated using 2-0 Vicryl interrupted sutures. The subcutaneous tissues were closed in two layers using 2-0 Vicryl interrupted sutures before the skin was closed using staples. The portal sites also were closed using staples. A sterile bulky dressing was applied to the shoulder before the arm was placed into a shoulder immobilizer. The patient was then awakened, extubated, and returned to the recovery room in satisfactory condition after tolerating the procedure well.

## 2023-02-18 NOTE — Anesthesia Procedure Notes (Signed)
Procedure Name: Intubation Date/Time: 02/18/2023 1:51 PM  Performed by: Lily Lovings, CRNAPre-anesthesia Checklist: Patient identified, Patient being monitored, Timeout performed, Emergency Drugs available and Suction available Patient Re-evaluated:Patient Re-evaluated prior to induction Oxygen Delivery Method: Circle system utilized Preoxygenation: Pre-oxygenation with 100% oxygen Induction Type: IV induction Ventilation: Mask ventilation without difficulty Laryngoscope Size: 3 and McGraph Grade View: Grade I Tube type: Oral Tube size: 7.5 mm Number of attempts: 1 Airway Equipment and Method: Stylet Placement Confirmation: ETT inserted through vocal cords under direct vision, positive ETCO2 and breath sounds checked- equal and bilateral Secured at: 22 cm Tube secured with: Tape Dental Injury: Teeth and Oropharynx as per pre-operative assessment

## 2023-02-18 NOTE — Anesthesia Postprocedure Evaluation (Signed)
Anesthesia Post Note  [name]  Procedure(s) Performed: RIGHT SHOULDER ARTHROSCOPY WITH DEBRIDEMENT, DECOMPRESSION, ROTATOR CUFF REPAIR, BICEPS TENODESIS. (Right: Shoulder)  Patient location during evaluation: PACU Anesthesia Type: General Level of consciousness: awake and alert, oriented and patient cooperative Pain management: pain level controlled Vital Signs Assessment: post-procedure vital signs reviewed and stable Respiratory status: spontaneous breathing, nonlabored ventilation and respiratory function stable Cardiovascular status: blood pressure returned to baseline and stable Postop Assessment: adequate PO intake Anesthetic complications: no   No notable events documented.   Last Vitals:  Vitals:   02/18/23 1538 02/18/23 1545  BP:  106/74  Pulse: 80 73  Resp: 15 16  Temp:    SpO2: 100% 97%    Last Pain:  Vitals:   02/18/23 1538  TempSrc:   PainSc: 7                  Reed Breech

## 2023-02-18 NOTE — Anesthesia Preprocedure Evaluation (Addendum)
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   Pulmonary exam normal breath sounds clear to auscultation       Cardiovascular negative cardio ROS Normal cardiovascular exam Rhythm:Regular Rate:Normal  Echo 02/10/19:  NORMAL LEFT VENTRICULAR SYSTOLIC FUNCTION  NORMAL RIGHT VENTRICULAR SYSTOLIC FUNCTION  NO VALVULAR STENOSIS  MILD VALVULAR REGURGITATION  Normal pulmonary pressures     Neuro/Psych negative neurological ROS     GI/Hepatic ,GERD  ,,  Endo/Other  Prediabetes   Renal/GU negative Renal ROS     Musculoskeletal   Abdominal   Peds  Hematology negative hematology ROS (+)   Anesthesia Other Findings   Reproductive/Obstetrics                             Anesthesia Physical Anesthesia Plan  ASA: 3  Anesthesia Plan: General   Post-op Pain Management:    Induction: Intravenous  PONV Risk Score and Plan: 1 and Ondansetron, Dexamethasone and Treatment may vary due to age or medical condition  Airway Management Planned: Oral ETT  Additional Equipment:   Intra-op Plan:   Post-operative Plan: Extubation in OR  Informed Consent: I have reviewed the patients History and Physical, chart, labs and discussed the procedure including the risks, benefits and alternatives for the proposed anesthesia with the patient or authorized representative who has indicated his/her understanding and acceptance.     Dental  advisory given  Plan Discussed with: CRNA  Anesthesia Plan Comments: (Patient consented for risks of anesthesia including but not limited to:  - adverse reactions to medications - damage to eyes, teeth, lips or other oral mucosa - nerve damage due to positioning  - sore throat or hoarseness - damage to heart, brain, nerves, lungs, other parts of body or loss of life  Informed patient about role of CRNA in peri- and intra-operative care.  Patient voiced understanding.)        Anesthesia Quick Evaluation

## 2023-02-18 NOTE — Discharge Instructions (Signed)
Keep dressing dry and intact.  °May shower after dressing changed on post-op day #4 (Saturday).  °Cover staples/sutures with Band-Aids after drying off. °Apply ice frequently to shoulder. °Keep shoulder immobilizer on at all times except may remove for bathing purposes. °Follow-up in 10-14 days or as scheduled. °

## 2023-02-18 NOTE — H&P (Signed)
History of Present Illness:  Benjamin Bolton is a 62 y.o. male who presents today as a result of a referral by Dr. Meredeth Ide for right shoulder pain and weakness.   The patient's symptoms began several years ago and developed without any specific cause or injury . He had seen Dr. Mariah Milling for neck pain and received a steroid injection into the right shoulder which he states provided substantial relief of his symptoms, lasting nearly 8 months before his symptoms recurred. He underwent a repeat injection which did not help him at all. He was referred to orthopedics. He saw Van Clines, PA, who gave him another injection but this also did not provide any sustained relief of his symptoms. Therefore, the patient was sent for an MRI scan and referred to me for further evaluation and treatment. The patient describes the symptoms as marked (major pain with significant limitations) and have the quality of being aching, constant, miserable, nagging, stabbing, tender, and throbbing. The pain is localized to the lateral arm/shoulder and localized to the anterior shoulder. These symptoms are aggravated constantly, with normal daily activities, with sleeping, at higher levels of activity, with overhead activity, reaching behind the back, and activity in general. He has tried acetaminophen, non-steroidal anti-inflammatories (diclofenac), and narcotics with only limited relief of his symptoms. He has tried rest with limited benefit. He has tried several injections as does described above with limited benefit. He does have neck issues with intermittent paresthesias extending down to his hand, but feels that his shoulder issues are separate from these symptoms. This complaint is not work related. He is a sports non-participant.  Shoulder Surgical History:  The patient has had no shoulder surgery in the past.  PMH/PSH/Family History/Social History/Meds/Allergies:  I have reviewed past medical, surgical, social and family history,  medications and allergies as documented in the EMR.  Current Outpatient Medications:  albuterol (PROVENTIL) 2.5 mg /3 mL (0.083 %) nebulizer solution Take 3 mLs (2.5 mg total) by nebulization every 6 (six) hours as needed for Wheezing 1080 mL 1  albuterol 90 mcg/actuation inhaler Inhale 2 inhalations into the lungs every 6 (six) hours as needed for Wheezing 54 g 3  cetirizine HCl (CETIRIZINE ORAL) Take 10 mg by mouth once daily  diclofenac (VOLTAREN) 75 MG EC tablet Take 1 tablet (75 mg total) by mouth 2 (two) times daily with meals for 30 days After food. 60 tablet 0  fluticasone-umeclidinium-vilanterol (TRELEGY ELLIPTA) 100-62.5-25 mcg inhaler Inhale 1 Puff into the lungs once daily 60 each 6  multivitamin tablet Take 1 tablet by mouth once daily  predniSONE (DELTASONE) 20 MG tablet Take 1 tablet (20 mg total) by mouth once daily 30 tablet 1  rosuvastatin (CRESTOR) 20 MG tablet Take 20 mg by mouth at bedtime  traMADoL (ULTRAM) 50 mg tablet Take 1-2 tablets (50-100 mg total) by mouth every 6 (six) hours as needed for Pain 60 tablet 0  acetylcysteine (MUCOMYST) 20 % solution for inhalation Inhale 2 mLs into the lungs every 6 (six) hours (Patient not taking: Reported on 01/01/2023) 30 mL 1  guaifenesin/dextromethorphan (MUCUS DM MAX ER ORAL) Take 600 mg by mouth once daily   Allergies:  Pork Derived (Porcine) Other (HIVES)   Past Medical History:  Centrilobular emphysema (CMS/HHS-HCC)  COPD (chronic obstructive pulmonary disease) (CMS/HHS-HCC)  Hyperlipidemia  Pulmonary nodules   Past Surgical History:  No pertinent surgical history.   Past Family History:  Diabetes type II Mother  Diabetes type II Father   Social History:   Socioeconomic  History:  Marital status: Legally Separated  Tobacco Use  Smoking status: Former  Current packs/day: 0.00  Average packs/day: 1 pack/day for 46.0 years (46.0 ttl pk-yrs)  Types: Cigarettes  Start date: 04/1976  Quit date: 04/2022  Years since  quitting: 0.8  Smokeless tobacco: Never  Vaping Use  Vaping status: Never Used  Substance and Sexual Activity  Alcohol use: Yes  Comment: socially  Drug use: Never  Sexual activity: Defer   Social Determinants of Health:   Financial Resource Strain: Medium Risk (01/10/2023)  Overall Financial Resource Strain (CARDIA)  Difficulty of Paying Living Expenses: Somewhat hard  Food Insecurity: Food Insecurity Present (01/01/2023)  Hunger Vital Sign  Worried About Running Out of Food in the Last Year: Often true  Ran Out of Food in the Last Year: Often true  Transportation Needs: Unmet Transportation Needs (01/10/2023)  PRAPARE - Risk analyst (Medical): Yes  Lack of Transportation (Non-Medical): Yes   Review of Systems:  A comprehensive 14 point ROS was performed, reviewed, and the pertinent orthopaedic findings are documented in the HPI.  Physical Exam:  Vitals:  01/24/23 1314  BP: 116/68  Weight: 58.1 kg (128 lb)  Height: 185.4 cm (6\' 1" )  PainSc: 8  PainLoc: Shoulder   General/Constitutional: The patient appears to be well-nourished, well-developed, and in no acute distress. Neuro/Psych: Normal mood and affect, oriented to person, place and time. Eyes: Non-icteric. Pupils are equal, round, and reactive to light, and exhibit synchronous movement. ENT: Unremarkable. Lymphatic: No palpable adenopathy. Respiratory: Lungs clear to auscultation, Normal chest excursion, No wheezes, and Non-labored breathing Cardiovascular: Regular rate and rhythm. No murmurs. and No edema, swelling or tenderness, except as noted in detailed exam. Integumentary: No impressive skin lesions present, except as noted in detailed exam. Musculoskeletal: Unremarkable, except as noted in detailed exam.  Right shoulder exam: SKIN: normal SWELLING: none WARMTH: none LYMPH NODES: no adenopathy palpable CREPITUS: none TENDERNESS: Mildly tender over anterolateral shoulder ROM  (active):  Forward flexion: 130 degrees Abduction: 120 degrees Internal rotation: L1 ROM (passive):  Forward flexion: 150 degrees Abduction: 140 degrees  ER/IR at 90 abd: 90 degrees / 55 degrees  He has mild pain with all motions.  STRENGTH: Forward flexion: 4/5 Abduction: 4/5 External rotation: 4/5 Internal rotation: 4-4+/5 Pain with RC testing: Mild pain with resisted forward flexion and abduction.  STABILITY: Normal  SPECIAL TESTS: Juanetta Gosling' test: positive, mild Speed's test: positive Capsulitis - pain w/ passive ER: no Crossed arm test: Minimally positive Crank: Not evaluated Anterior apprehension: Negative Posterior apprehension: Not evaluated  He is neurovascularly intact to the right upper extremity.  Shoulder X-Ray Imaging: Recent true AP, Y-scapular, and axillary views of the right shoulder are available for review and have been reviewed by myself. These films demonstrate no evidence for fractures, lytic lesions, or significant degenerative changes. The subacromial space is moderately decreased . There is a moderate-sized subacromial spur. He demonstrates a Type II-III acromion.  Right Shoulder Imaging, MRI: MRI Shoulder Cartilage: No cartilage abnormality. MRI Shoulder Rotator Cuff: Full thickness tear of the supraspinatus. Retracted to the humeral head. MRI Shoulder Labrum / Biceps: No labral tear or biceps abormality. MRI Shoulder Bone: Normal bone.  Both the films report were reviewed by myself and discussed with the patient.  Assessment:   Encounter Diagnoses  Name Primary?  Nontraumatic complete tear of right rotator cuff Yes  Rotator cuff tendinitis, right  Tendinitis of upper biceps tendon of right shoulder   Plan:  The treatment  options were discussed with the patient. In addition, patient educational materials were provided regarding the diagnosis and treatment options. The patient is quite frustrated by his symptoms and functional limitations, and  is ready to consider more aggressive treatment options. Therefore, I have recommended a surgical procedure, specifically a right shoulder arthroscopy with debridement, decompression, rotator cuff repair, probable biceps tenodesis. The procedure was discussed with the patient, as were the potential risks (including bleeding, infection, nerve and/or blood vessel injury, persistent or recurrent pain, failure of the repair, progression of arthritis, need for further surgery, blood clots, strokes, heart attacks and/or arhythmias, pneumonia, etc.) and benefits. The patient states his understanding and wishes to proceed. All of the patient's questions and concerns were answered. He can call any time with further concerns. He will follow up post-surgery, routine.    H&P reviewed and patient re-examined. No changes.

## 2023-02-18 NOTE — Discharge Summary (Incomplete)
Physician Discharge Summary  Patient ID: Benjamin Bolton MRN: [id number] DOB/AGE: 1960/11/24 62 y.o.  Admit date: 02/18/2023 Discharge date: 02/19/2023  Admission Diagnoses:  Status post right rotator cuff repair [N82.956] Impingement/tendinopathy with large full-thickness rotator cuff tear and biceps tendinopathy, right shoulder.   Discharge Diagnoses: Patient Active Problem List   Diagnosis Date Noted   Status post right rotator cuff repair 02/18/2023    Past Medical History:  Diagnosis Date   COPD (chronic obstructive pulmonary disease) (HCC)    Dyspnea    GERD (gastroesophageal reflux disease)    no meds   Hx of migraines    Oxygen dependent    3 L [place]   Pneumonia 2023   Pre-diabetes    Pulmonary nodule    Rotator cuff tear, right    Tobacco use      Transfusion: None.   Consultants (if any):   Discharged Condition: Improved  Hospital Course: Benjamin Bolton is an 62 y.o. male who was admitted 02/18/2023 with a diagnosis of Impingement/tendinopathy with large full-thickness rotator cuff tear and biceps tendinopathy of the right shoulder and went to the operating room on 02/18/2023 and underwent the above named procedures.    Surgeries: Procedure(s): RIGHT SHOULDER ARTHROSCOPY WITH DEBRIDEMENT, DECOMPRESSION, ROTATOR CUFF REPAIR, BICEPS TENODESIS. on 02/18/2023 Patient tolerated the surgery well. Taken to PACU where he was stabilized and then transferred to the post-op recovery area.  PT/OT was initiated on POD1.  Heels elevated on bed with rolled towels. No evidence of DVT. Negative [place].  Patient's IV was removed on POD1.  Procedure Note:  The patient was brought into the operating room and lain in the supine position. After undergoing satisfactory general endotracheal intubation and anesthesia, he was repositioned in the beach chair position using the beach chair positioner. The right shoulder and upper extremity were prepped with ChloraPrep solution before being  draped sterilely. Preoperative antibiotics were administered. A timeout was performed to confirm the proper surgical site before the expected portal sites and incision site were injected with a solution of 10 cc of Exparel and 20 cc of 0.5% Sensorcaine with epinephrine.    A posterior portal was created and the glenohumeral joint thoroughly inspected with the findings as described above. An anterior portal was created using an outside-in technique. The labrum and rotator cuff were further probed, again confirming the above-noted findings. The full-radius dissector was inserted and used to debride areas of synovitis as well as the torn margins of the rotator cuff. The ArthroCare wand was inserted and used to release the biceps tendon from its labral anchor.  It also was used to obtain hemostasis as well as to "anneal" the labrum superiorly and anteriorly. The instruments were removed from the joint after suctioning the excess fluid.   The camera was repositioned through the posterior portal into the subacromial space. A separate lateral portal was created using an outside-in technique. The 3.5 mm full-radius resector was introduced and used to perform a subtotal bursectomy. The ArthroCare wand was then inserted and used to remove the periosteal tissue off the undersurface of the anterior third of the acromion as well as to recess the coracoacromial ligament from its attachment along the anterior and lateral margins of the acromion. The 4.0 mm acromionizing bur was introduced and used to complete the decompression by removing the undersurface of the anterior third of the acromion. The full radius resector was reintroduced to remove any residual bony debris before the ArthroCare wand was reintroduced to obtain hemostasis. The instruments  were then removed from the subacromial space after suctioning the excess fluid.   An approximately 4-5 cm incision was made over the anterolateral aspect of the shoulder  beginning at the anterolateral corner of the acromion and extending distally in line with the bicipital groove. This incision was carried down through the subcutaneous tissues to expose the deltoid fascia. The raphae between the anterior and middle thirds was identified and this plane developed to provide access into the subacromial space. Additional bursal tissues were debrided sharply using Metzenbaum scissors. The rotator cuff tear was readily identified. The margins were debrided sharply with a #15 blade and the exposed greater tuberosity roughened with a rongeur. The longitudinal portion of the tear was repaired using three #2 FiberWire interrupted sutures in a side-to-side fashion before the more lateral portion of the tear was repaired using two [employer]hew 2.9 mm Q-Fix anchors. These more lateral sutures were then brought back laterally and secured using two Smith & Nephew Healicoil knotless RegeneSorb anchors to create a two-layer closure. An apparent watertight closure was obtained.   Given the size of the tear and the quality of the tissue, it was felt best to reinforce this repair with a Smith & Nephew Regeneten patch.  Therefore, the medium sized patch was selected and secured over the repair using the appropriate bone and soft tissue staples.  This additional procedure added an extra level of difficulty to the procedure and took an additional 20 to 25 minutes.   The bicipital groove was identified by palpation and opened for 1-1.5 cm. The biceps tendon stump was retrieved through this defect. The floor of the bicipital groove was roughened with a curet before another Biomet 2.9 mm JuggerKnot anchor was inserted. Both sets of sutures were passed through the biceps tendon and tied securely to effect the tenodesis. The bicipital sheath was reapproximated using two #0 Ethibond interrupted sutures, incorporating the biceps tendon to further reinforce the tenodesis.   The wound was copiously  irrigated with sterile saline solution before the deltoid raphae was reapproximated using 2-0 Vicryl interrupted sutures. The subcutaneous tissues were closed in two layers using 2-0 Vicryl interrupted sutures before the skin was closed using staples. The portal sites also were closed using staples. A sterile bulky dressing was applied to the shoulder before the arm was placed into a shoulder immobilizer. The patient was then awakened, extubated, and returned to the recovery room in satisfactory condition after tolerating the procedure well.  He was given perioperative antibiotics:  Anti-infectives (From admission, onward)    Start     Dose/Rate Route Frequency Ordered Stop   02/18/23 2000  ceFAZolin (ANCEF) IVPB 2g/100 mL premix        2 g 200 mL/hr over 30 Minutes Intravenous Every 6 hours 02/18/23 1657 02/19/23 0901   02/18/23 0600  ceFAZolin (ANCEF) IVPB 2g/100 mL premix        2 g 200 mL/hr over 30 Minutes Intravenous On call to O.R. 02/17/23 2213 02/18/23 1336     .  He was given sequential compression devices and early ambulation for DVT prophylaxis.  He benefited maximally from the hospital stay and there were no complications.    Recent vital signs:  Vitals:   02/19/23 0725 02/19/23 1056  BP: 94/66 95/70  Pulse: 76   Resp: 16   Temp: (!) 97.1 F (36.2 C)   SpO2: 97%     Recent laboratory studies:  No results found for: "HGB" No results found for: "WBC", "  PLT" No results found for: "INR" No results found for: "NA", "K", "CL", "CO2", "BUN", "CREATININE", "GLUCOSE"  Discharge Medications:   Allergies as of 02/19/2023   No Known Allergies      Medication List     TAKE these medications    albuterol (2.5 MG/3ML) 0.083% nebulizer solution Commonly known as: PROVENTIL USE 1 VIAL IN NEBULIZER EVERY 6 HOURS AS NEEDED FOR WHEEZING   ibuprofen 800 MG tablet Commonly known as: ADVIL Take 1 tablet (800 mg total) by mouth every 6 (six) hours as needed.   multivitamin  tablet Take 1 tablet by mouth 3 (three) times a week.   ondansetron 4 MG tablet Commonly known as: ZOFRAN Take 1 tablet (4 mg total) by mouth every 6 (six) hours as needed for nausea.   oxyCODONE 5 MG immediate release tablet Commonly known as: Oxy IR/ROXICODONE Take 1-2 tablets (5-10 mg total) by mouth every 4 (four) hours as needed for severe pain.   OXYGEN Inhale 3 L into the lungs continuous.   predniSONE 20 MG tablet Commonly known as: DELTASONE Take 20 mg by mouth daily with breakfast.   Trelegy Ellipta 100-62.5-25 MCG/ACT Aepb Generic drug: Fluticasone-Umeclidin-Vilant Inhale 1 puff into the lungs every morning.       Diagnostic Studies: No results found.  Disposition: Discharge disposition: 01-Home or Self Care     Plan for discharge home later this afternoon after working with PT.   Follow-up Information     Anson [place], PA-C Follow up in 14 day(s).   Specialty: Physician Assistant Why: Staple Removal.  03/03/2023 9:45 AM Post Op Contact information: [address] [place] [address] [phone]                Signed: Meriel Pica PA-C 02/19/2023, 12:17 PM

## 2023-02-19 ENCOUNTER — Encounter: Payer: Self-pay | Admitting: Surgery

## 2023-02-19 DIAGNOSIS — M75121 Complete rotator cuff tear or rupture of right shoulder, not specified as traumatic: Secondary | ICD-10-CM | POA: Diagnosis not present

## 2023-02-19 MED ORDER — OXYCODONE HCL 5 MG PO TABS
ORAL_TABLET | ORAL | Status: AC
  Filled 2023-02-19: qty 2

## 2023-02-19 MED ORDER — ADULT MULTIVITAMIN W/MINERALS CH
ORAL_TABLET | ORAL | Status: AC
  Filled 2023-02-19: qty 1

## 2023-02-19 MED ORDER — OXYCODONE HCL 5 MG PO TABS: 5.0000 mg | ORAL_TABLET | ORAL | 0 refills | Status: DC | PRN

## 2023-02-19 MED ORDER — ACETAMINOPHEN 500 MG PO TABS
ORAL_TABLET | ORAL | Status: AC
  Filled 2023-02-19: qty 2

## 2023-02-19 MED ORDER — KETOROLAC TROMETHAMINE 15 MG/ML IJ SOLN
INTRAMUSCULAR | Status: AC
  Filled 2023-02-19: qty 1

## 2023-02-19 MED ORDER — CEFAZOLIN SODIUM-DEXTROSE 2-4 GM/100ML-% IV SOLN
INTRAVENOUS | Status: AC
  Filled 2023-02-19: qty 100

## 2023-02-19 MED ORDER — ONDANSETRON HCL 4 MG PO TABS: 4.0000 mg | ORAL_TABLET | Freq: Four times a day (QID) | ORAL | 0 refills | Status: DC | PRN

## 2023-02-19 MED ORDER — ONDANSETRON HCL 4 MG PO TABS: 4.0000 mg | ORAL_TABLET | Freq: Four times a day (QID) | ORAL | 0 refills | Status: AC | PRN

## 2023-02-19 MED ORDER — IBUPROFEN 800 MG PO TABS: 800.0000 mg | ORAL_TABLET | Freq: Four times a day (QID) | ORAL | 0 refills | Status: DC | PRN

## 2023-02-19 NOTE — Evaluation (Signed)
Physical Therapy Evaluation Patient Details Name: Benjamin Bolton MRN: [id number] DOB: 04/15/1961 Today's Date: 02/19/2023  History of Present Illness  Patient is a 62 year old male who was admitted for R rotator cuff repair.  Clinical Impression  Patient received sitting up on side of bed with OT present. Patient is pleasant and agrees to PT assessment. Patient is able to stand with min guard. Ambulated 100 feet with min guard and ambulated up/down 4 steps with single rail. Patient moving well, but does not have reliable assistance at home. Also reports his air conditioning is broken. He will continue to benefit from skilled PT to improve functional independence.         Recommendations for follow up therapy are one component of a multi-disciplinary discharge planning process, led by the attending physician.  Recommendations may be updated based on patient status, additional functional criteria and insurance authorization.  Follow Up Recommendations       Assistance Recommended at Discharge Intermittent Supervision/Assistance  Patient can return home with the following  A little help with walking and/or transfers;A lot of help with bathing/dressing/bathroom;Assistance with cooking/housework    Equipment Recommendations None recommended by PT  Recommendations for Other Services       Functional Status Assessment Patient has had a recent decline in their functional status and demonstrates the ability to make significant improvements in function in a reasonable and predictable amount of time.     Precautions / Restrictions Precautions Precautions: Fall Required Braces or Orthoses: Sling Restrictions Weight Bearing Restrictions: Yes RUE Weight Bearing: Non weight bearing Other Position/Activity Restrictions: R UE immobilizer      Mobility  Bed Mobility               General bed mobility comments: NT, patient received sitting up on side of bed with OT present     Transfers Overall transfer level: Modified independent Equipment used: None                    Ambulation/Gait Ambulation/Gait assistance: Min guard Gait Distance (Feet): 100 Feet Assistive device: None Gait Pattern/deviations: Step-through pattern Gait velocity: decreased     General Gait Details: patient with steady ambulation, min guard  Stairs Stairs: Yes Stairs assistance: Supervision Stair Management: One rail Left, Forwards, Alternating pattern Number of Stairs: 4 General stair comments: safe on steps with minimal use of L rail  Wheelchair Mobility    Modified Rankin (Stroke Patients Only)       Balance Overall balance assessment: No apparent balance deficits (not formally assessed)                                           Pertinent Vitals/Pain Pain Assessment Pain Assessment: Faces Faces Pain Scale: Hurts a little bit Pain Location: R arm Pain Descriptors / Indicators: Discomfort, Sore, Tightness Pain Intervention(s): Monitored during session, Repositioned, Ice applied    Home Living Family/patient expects to be discharged to:: Private residence Living Arrangements: Non-relatives/Friends Available Help at Discharge: Other (Comment) (Roomate can help a little) Type of Home: House Home Access: Stairs to enter Entrance Stairs-Rails: None Entrance Stairs-Number of Steps: 2-3   Home Layout: One level Home Equipment: BSC/3in1 Additional Comments: sleeps on roommates couch; 3L O2 at baseline.    Prior Function Prior Level of Function : Independent/Modified Independent  Mobility Comments: Limited to household mobility 2/2 baseline O2 needs. ADLs Comments: Says he goes slow, but is independent with all ADL management. Denies falls.     Hand Dominance   Dominant Hand: Right    Extremity/Trunk Assessment   Upper Extremity Assessment Upper Extremity Assessment: Defer to OT evaluation    Lower Extremity  Assessment Lower Extremity Assessment: Overall WFL for tasks assessed    Cervical / Trunk Assessment Cervical / Trunk Assessment: Normal  Communication   Communication: No difficulties  Cognition Arousal/Alertness: Awake/alert Behavior During Therapy: WFL for tasks assessed/performed Overall Cognitive Status: Within Functional Limits for tasks assessed                                          General Comments      Exercises     Assessment/Plan    PT Assessment Patient needs continued PT services  PT Problem List Pain;Decreased mobility       PT Treatment Interventions Gait training;Stair training;Functional mobility training;Therapeutic activities;Patient/family education;Modalities;Therapeutic exercise    PT Goals (Current goals can be found in the Care Plan section)  Acute Rehab PT Goals Patient Stated Goal: return home PT Goal Formulation: With patient Time For Goal Achievement: 02/26/23 Potential to Achieve Goals: Good    Frequency 7X/week     Co-evaluation               AM-PAC PT "6 Clicks" Mobility  Outcome Measure Help needed turning from your back to your side while in a flat bed without using bedrails?: A Little Help needed moving from lying on your back to sitting on the side of a flat bed without using bedrails?: A Little Help needed moving to and from a bed to a chair (including a wheelchair)?: A Little Help needed standing up from a chair using your arms (e.g., wheelchair or bedside chair)?: A Little Help needed to walk in hospital room?: A Little Help needed climbing 3-5 steps with a railing? : A Little 6 Click Score: 18    End of Session Equipment Utilized During Treatment: Oxygen Activity Tolerance: Patient tolerated treatment well Patient left: in chair;with call bell/phone within reach Nurse Communication: Mobility status PT Visit Diagnosis: Other abnormalities of gait and mobility (R26.89);Pain Pain - Right/Left:  Right Pain - part of body: Shoulder    Time: 1000-1018 PT Time Calculation (min) (ACUTE ONLY): 18 min   Charges:   PT Evaluation $PT Eval Moderate Complexity: 1 Mod PT Treatments $Gait Training: 8-22 mins        Admiral Marcucci, PT, GCS 02/19/23,11:00 AM

## 2023-02-19 NOTE — Progress Notes (Signed)
  Subjective: 1 Day Post-Op Procedure(s) (LRB): RIGHT SHOULDER ARTHROSCOPY WITH DEBRIDEMENT, DECOMPRESSION, ROTATOR CUFF REPAIR, BICEPS TENODESIS. (Right) Patient reports pain as  mild in the right shoulder .   Patient is well, and has had no acute complaints or problems Plan is to go Home after hospital stay. Negative for chest pain and shortness of breath Fever: no Gastrointestinal:Negative for nausea and vomiting this morning. Does report some nausea last evening. Patient on chronic O2.  Objective: Vital signs in last 24 hours: Temp:  [97 F (36.1 C)-98.4 F (36.9 C)] 97.6 F (36.4 C) (06/19 0500) Pulse Rate:  [65-80] 71 (06/19 0500) Resp:  [13-22] 16 (06/19 0500) BP: (90-111)/(69-79) 100/72 (06/19 0500) SpO2:  [94 %-100 %] 94 % (06/19 0500) Weight:  [58.1 kg] 58.1 kg (06/18 1124)  Intake/Output from previous day:  Intake/Output Summary (Last 24 hours) at 02/19/2023 0719 Last data filed at 02/19/2023 0410 Gross per 24 hour  Intake 1785 ml  Output 460 ml  Net 1325 ml    Intake/Output this shift: No intake/output data recorded.  Labs: No results for input(s): "HGB" in the last 72 hours. No results for input(s): "WBC", "RBC", "HCT", "PLT" in the last 72 hours. No results for input(s): "NA", "K", "CL", "CO2", "BUN", "CREATININE", "GLUCOSE", "CALCIUM" in the last 72 hours. No results for input(s): "LABPT", "INR" in the last 72 hours.   EXAM General - Patient is Alert, Appropriate, and Oriented Extremity - ABD soft Neurovascular intact Dorsiflexion/Plantar flexion intact Incision: Bulky dressing intact to the right shoulder this AM. Dressing/Incision - clean, dry, no drainage Motor Function - intact, moving hand well on exam.  Intact to light touch to the right arm.  Past Medical History:  Diagnosis Date   COPD (chronic obstructive pulmonary disease) (HCC)    Dyspnea    GERD (gastroesophageal reflux disease)    no meds   Hx of migraines    Oxygen dependent    3  L    Pneumonia 2023   Pre-diabetes    Pulmonary nodule    Rotator cuff tear, right    Tobacco use     Assessment/Plan: 1 Day Post-Op Procedure(s) (LRB): RIGHT SHOULDER ARTHROSCOPY WITH DEBRIDEMENT, DECOMPRESSION, ROTATOR CUFF REPAIR, BICEPS TENODESIS. (Right) Principal Problem:   Status post right rotator cuff repair  Estimated body mass index is 16.89 kg/m as calculated from the following:   Height as of this encounter: 6\' 1"  (1.854 m).   Weight as of this encounter: 58.1 kg. Advance diet Up with therapy D/C IV fluids when tolerating po intake.  Vitals reviewed this AM. Pain is controlled this morning. Up with PT/OT this morning. Plan for possible d/c home today pending progress with PT.  DVT Prophylaxis - TED hose Non-weightbearing to the right arm.  Valeria Batman, PA-C [facility]opa[facility] 02/19/2023, 7:19 AM

## 2023-02-19 NOTE — Evaluation (Signed)
Occupational Therapy Evaluation Patient Details Name: Benjamin Bolton MRN: [id number] DOB: 1961/08/31 Today's Date: 02/19/2023   History of Present Illness Patient is a 62 year old male who was admitted for R rotator cuff repair.   Clinical Impression   Benjamin Bolton was seen for an OT evaluation this date. Pt lives alone in a 1 level home with 2 STE. Prior to surgery, pt was generally independent with ADL management. He states that his roommate will occasionally do meal prep for him, but otherwise he makes due with easy to prepare foods and limits his functional activity 2/2 baseline O2 needs. Pt is on 3L O2 chronically at home. He denies falls history in the las 6 months. Pt has orders for his RUE (dominant) to be immobilized and will be NWBing per MD. Patient presents with impaired strength, ROM, and RUE pain. These impairments result in a decreased ability to perform self care tasks requiring MIN-MOD assist for UB dressing and bathing and MAX assist for application of compression stockings and sling/immobilizer. Pt instructed in total shoulder precautions, sling/immobilizer mgt, ROM exercises for RUE (with instructions for no shoulder exercises until full sensation has returned), adaptive strategies for bathing/dressing/toileting/grooming, positioning and considerations for sleep, and home/routines modifications to maximize falls prevention, safety, and independence. OT adjusted sling/immobilizer and polar care to improve comfort, optimize positioning, and to maximize skin integrity/safety. Pt verbalized understanding of all education/training provided. Pt will benefit from skilled OT services to address these limitations and improve independence in daily tasks. Recommend HHOT services to continue therapy to maximize return to PLOF, address home/routines modifications and safety, minimize falls risk, and minimize caregiver burden.         Recommendations for follow up therapy are one component of a  multi-disciplinary discharge planning process, led by the attending physician.  Recommendations may be updated based on patient status, additional functional criteria and insurance authorization.   Assistance Recommended at Discharge Intermittent Supervision/Assistance  Patient can return home with the following A little help with walking and/or transfers;A little help with bathing/dressing/bathroom;Assist for transportation;Assistance with cooking/housework;Help with stairs or ramp for entrance    Functional Status Assessment  Patient has had a recent decline in their functional status and demonstrates the ability to make significant improvements in function in a reasonable and predictable amount of time.  Equipment Recommendations  BSC/3in1    Recommendations for Other Services       Precautions / Restrictions Precautions Precautions: Fall Required Braces or Orthoses: Sling Restrictions Weight Bearing Restrictions: Yes RUE Weight Bearing: Non weight bearing Other Position/Activity Restrictions: R UE immobilizer      Mobility Bed Mobility Overal bed mobility: Modified Independent             General bed mobility comments: Comes to sitting at EOB with good control and use of bed rails.    Transfers Overall transfer level: Needs assistance Equipment used: 1 person hand held assist, None Transfers: Sit to/from Stand Sit to Stand: Supervision, From elevated surface                  Balance Overall balance assessment: Mild deficits observed, not formally tested                                         ADL either performed or assessed with clinical judgement   ADL Overall ADL's : Needs assistance/impaired  General ADL Comments: Pt is functionally limited by increased pain and decreased functional use of his RUE. He requires SET UP assist for LB dressing from STS, MIN A to don T-shirt, and MAX A  to don sling/immobilizer. He return demos undertsanding of compensatory dressing techiques in setting of shoulder precautions, but requires cueing t/o session to relax his RUE to a neutral position and minimize scapular elevation.     Vision Patient Visual Report: No change from baseline       Perception     Praxis      Pertinent Vitals/Pain Pain Assessment Pain Assessment: 0-10 Pain Score: 9  Pain Location: R arm/shoulder Pain Descriptors / Indicators: Discomfort, Sore, Tightness Pain Intervention(s): Limited activity within patient's tolerance, Monitored during session, Repositioned, Utilized relaxation techniques, Patient requesting pain meds-RN notified     Hand Dominance Right   Extremity/Trunk Assessment Upper Extremity Assessment Upper Extremity Assessment: RUE deficits/detail RUE Deficits / Details: s/p R TSA with biceps tenodesis. Pt to remain NWB in sling/immobilizer in his R (dominant) extremity. RUE: Unable to fully assess due to pain;Unable to fully assess due to immobilization   Lower Extremity Assessment Lower Extremity Assessment: Overall WFL for tasks assessed   Cervical / Trunk Assessment Cervical / Trunk Assessment: Normal   Communication Communication Communication: No difficulties   Cognition Arousal/Alertness: Awake/alert Behavior During Therapy: WFL for tasks assessed/performed Overall Cognitive Status: Within Functional Limits for tasks assessed                                       General Comments       Exercises Other Exercises Other Exercises: Pt educated on total shoulder precautions, compensatory ADL management strategies, sling immobilizer management, and routines modifications to support safety and functional independence during ADL management.   Shoulder Instructions      Home Living Family/patient expects to be discharged to:: Private residence Living Arrangements: Non-relatives/Friends Available Help at  Discharge: Other (Comment) (roommate can help intermittently) Type of Home: House Home Access: Stairs to enter [employer] of Steps: 2-3 Entrance Stairs-Rails: None Home Layout: One level     Bathroom Shower/Tub: Chief Strategy Officer: Standard Bathroom Accessibility: Yes   Home Equipment: BSC/3in1   Additional Comments: sleeps on roommates couch; 3L O2 at baseline.      Prior Functioning/Environment Prior Level of Function : Independent/Modified Independent             Mobility Comments: Limited to household mobility 2/2 baseline O2 needs. ADLs Comments: Says he goes slow, but is independent with all ADL management. Denies falls.        OT Problem List: Decreased strength;Pain;Decreased range of motion;Decreased safety awareness;Decreased knowledge of use of DME or AE;Impaired balance (sitting and/or standing);Decreased knowledge of precautions;Impaired UE functional use      OT Treatment/Interventions: Self-care/ADL training;Therapeutic exercise;Therapeutic activities;DME and/or AE instruction;Balance training;Patient/family education;Energy conservation    OT Goals(Current goals can be found in the care plan section) Acute Rehab OT Goals Patient Stated Goal: To be able to take care of myself. OT Goal Formulation: With patient Time For Goal Achievement: 03/05/23 Potential to Achieve Goals: Good ADL Goals Pt Will Perform Grooming: sitting;with modified independence Pt Will Perform Upper Body Dressing: sitting;with modified independence Additional ADL Goal #1: Prior to DC, pt will return demonstrate understanding of 3/3 compensatory ADL management strategies in setting of total shoulder precautions with min cueing for  technique.  OT Frequency: Min 2X/week    Co-evaluation              AM-PAC OT "6 Clicks" Daily Activity     Outcome Measure Help from another person eating meals?: None Help from another person taking care of personal  grooming?: A Little Help from another person toileting, which includes using toliet, bedpan, or urinal?: A Little Help from another person bathing (including washing, rinsing, drying)?: A Little Help from another person to put on and taking off regular upper body clothing?: A Lot Help from another person to put on and taking off regular lower body clothing?: A Little 6 Click Score: 18   End of Session Equipment Utilized During Treatment: Oxygen Nurse Communication: Mobility status;Patient requests pain meds  Activity Tolerance: Patient tolerated treatment well Patient left: Other (comment) (In therapy gym with PT present to do stair training.)  OT Visit Diagnosis: Other abnormalities of gait and mobility (R26.89);Pain Pain - Right/Left: Right Pain - part of body: Shoulder;Arm                Time: 0926-1010 OT Time Calculation (min): 44 min Charges:  OT General Charges $OT Visit: 1 Visit OT Evaluation $OT Eval Moderate Complexity: 1 Mod OT Treatments $Self Care/Home Management : 23-37 mins  Rockney Ghee, M.S., OTR/L 02/19/23, 11:38 AM

## 2023-02-19 NOTE — Progress Notes (Signed)
Pts BP 95/70,Asymptomatic.  MD Poggi notified. No orders received, Per MD Poggi, pt ok to discharge.

## 2023-02-19 NOTE — Plan of Care (Signed)
  Problem: Activity: Goal: Risk for activity intolerance will decrease Outcome: Progressing   Problem: Nutrition: Goal: Adequate nutrition will be maintained Outcome: Progressing   Problem: Pain Managment: Goal: General experience of comfort will improve Outcome: Progressing   Problem: Safety: Goal: Ability to remain free from injury will improve Outcome: Progressing   

## 2023-02-19 NOTE — Progress Notes (Signed)
DISCHARGE NOTE:  Pt given discharge instructions and verbalized understanding. Pt connected to home O2 tank @ 3L. Pt wheeled to car by staff, Approved Medical Transportation here to transport pt home.

## 2023-02-19 NOTE — TOC Progression Note (Signed)
Transition of Care [facility]) - Progression Note    Patient Details  Name: Benjamin Bolton MRN: [id number] Date of Birth: [birth year]  Transition of Care [facility]) CM/SW Contact  Marlowe Sax, RN Phone Number: 02/19/2023, 12:00 PM  Clinical Narrative:     Met with the patient  He lives with a room mate, he sleeps omn the sofa I provided him with the number for [employer] for meal delivery He has medical transport set up thru his Ins company He uses Oxygen at home and has a portable tank with him to travel home [facility] is not able to service the area he lives in He walks independently and does not have a need for DME No additional resources needed       Expected Discharge Plan and Services         Expected Discharge Date: 02/19/23                                     Social Determinants of Health (SDOH) Interventions SDOH Screenings   Food Insecurity: Food Insecurity Present (02/18/2023)  Housing: High Risk (02/18/2023)  Transportation Needs: Patient Unable To Answer (02/18/2023)  Utilities: At Risk (02/18/2023)  Tobacco Use: High Risk (02/19/2023)    Readmission Risk Interventions     No data to display

## 2023-02-26 ENCOUNTER — Emergency Department: Payer: 59

## 2023-02-26 ENCOUNTER — Other Ambulatory Visit: Payer: Self-pay

## 2023-02-26 ENCOUNTER — Emergency Department
Admission: EM | Admit: 2023-02-26 | Discharge: 2023-02-26 | Disposition: A | Discharge status: Home / Self Care | Payer: 59 | Attending: Emergency Medicine | Admitting: Emergency Medicine

## 2023-02-26 DIAGNOSIS — R600 Localized edema: Secondary | ICD-10-CM

## 2023-02-26 DIAGNOSIS — I82411 Acute embolism and thrombosis of right femoral vein: Secondary | ICD-10-CM

## 2023-02-26 DIAGNOSIS — H1033 Unspecified acute conjunctivitis, bilateral: Secondary | ICD-10-CM | POA: Diagnosis not present

## 2023-02-26 DIAGNOSIS — I82409 Acute embolism and thrombosis of unspecified deep veins of unspecified lower extremity: Secondary | ICD-10-CM

## 2023-02-26 DIAGNOSIS — H05223 Edema of bilateral orbit: Secondary | ICD-10-CM | POA: Diagnosis not present

## 2023-02-26 HISTORY — DX: Acute embolism and thrombosis of unspecified deep veins of unspecified lower extremity: I82.409

## 2023-02-26 LAB — CBC
HCT: 41.1 % (ref 39.0–52.0)
Hemoglobin: 13.1 g/dL (ref 13.0–17.0)
MCH: 31.5 pg (ref 26.0–34.0)
MCHC: 31.9 g/dL (ref 30.0–36.0)
MCV: 98.8 fL (ref 80.0–100.0)
Platelets: 209 10*3/uL (ref 150–400)
RBC: 4.16 MIL/uL — ABNORMAL LOW (ref 4.22–5.81)
RDW: 14.6 % (ref 11.5–15.5)
WBC: 8 10*3/uL (ref 4.0–10.5)
nRBC: 0 % (ref 0.0–0.2)

## 2023-02-26 LAB — URINALYSIS, W/ REFLEX TO CULTURE (INFECTION SUSPECTED)
Bilirubin Urine: NEGATIVE
Glucose, UA: NEGATIVE mg/dL
Hgb urine dipstick: NEGATIVE
Ketones, ur: 5 mg/dL — AB
Nitrite: NEGATIVE
Protein, ur: NEGATIVE mg/dL
Specific Gravity, Urine: 1.019 (ref 1.005–1.030)
pH: 5 (ref 5.0–8.0)

## 2023-02-26 LAB — BASIC METABOLIC PANEL
Anion gap: 6 (ref 5–15)
BUN: 22 mg/dL (ref 8–23)
CO2: 28 mmol/L (ref 22–32)
Calcium: 8.9 mg/dL (ref 8.9–10.3)
Chloride: 106 mmol/L (ref 98–111)
Creatinine, Ser: 1.15 mg/dL (ref 0.61–1.24)
GFR, Estimated: >60 mL/min (ref >60)
Glucose, Bld: 131 mg/dL — ABNORMAL HIGH (ref 70–99)
Potassium: 4.4 mmol/L (ref 3.5–5.1)
Sodium: 140 mmol/L (ref 135–145)

## 2023-02-26 LAB — BRAIN NATRIURETIC PEPTIDE: B Natriuretic Peptide: 110.3 pg/mL — ABNORMAL HIGH (ref 0.0–100.0)

## 2023-02-26 MED ORDER — APIXABAN 5 MG PO TABS: 5.0000 mg | ORAL_TABLET | Freq: Two times a day (BID) | ORAL | Status: DC

## 2023-02-26 MED ORDER — APIXABAN (ELIQUIS) VTE STARTER PACK (10MG AND 5MG): ORAL_TABLET | ORAL | 0 refills | Status: DC

## 2023-02-26 MED ORDER — APIXABAN 5 MG PO TABS: 10.0000 mg | ORAL_TABLET | Freq: Two times a day (BID) | ORAL | Status: DC

## 2023-02-26 MED ORDER — IOHEXOL 350 MG/ML SOLN
75.0000 mL | Freq: Once | INTRAVENOUS | Status: AC | PRN
  Administered 2023-02-26: 75 mL via INTRAVENOUS

## 2023-02-26 NOTE — ED Notes (Signed)
Pt A&O x4, no obvious distress noted, respirations regular/unlabored. Pt verbalizes understanding of discharge instructions. Pt wheeled out of ED without incident   

## 2023-02-26 NOTE — ED Provider Notes (Signed)
[facility] Provider Note   Event Date/Time   First MD Initiated Contact with Patient 02/26/23 1607     (approximate) History  No chief complaint on file.  HPI Benjamin Bolton is a 62 y.o. male who presents for edema to bilateral lower extremities as well as periorbitally.  Patient states that over the last week he has had right lower extremity swelling and now left lower extremity swelling as well as swelling around both eyes with associated watering and redness around the eyes.  Patient denies any history of similar symptoms.  Patient is concerned as he recently had shoulder surgery on the 18th of this month. ROS: Patient currently denies any vision changes, tinnitus, difficulty speaking, facial droop, sore throat, chest pain, shortness of breath, abdominal pain, nausea/vomiting/diarrhea, dysuria, or weakness/numbness/paresthesias in any extremity   Physical Exam  Triage Vital Signs: ED Triage Vitals  Enc Vitals Group     BP 02/26/23 1459 116/82     Pulse Rate 02/26/23 1459 77     Resp 02/26/23 1459 18     Temp 02/26/23 1456 98.3 F (36.8 C)     Temp src --      SpO2 02/26/23 1459 100 %     Weight 02/26/23 1457 130 lb (59 kg)     Height 02/26/23 1457 6' (1.829 m)     Head Circumference --      Peak Flow --      Pain Score 02/26/23 1457 8     Pain Loc --      Pain Edu? --      Excl. in GC? --    Most recent vital signs: Vitals:   02/26/23 1459 02/26/23 2021  BP: 116/82 133/86  Pulse: 77 78  Resp: 18 20  Temp:  97.9 F (36.6 C)  SpO2: 100% 97%   General: Awake, oriented x4. CV:  Good peripheral perfusion.  Resp:  Normal effort.  Abd:  No distention.  Other:  Middle-aged well-developed, well-nourished African-American male laying in bed in no acute distress.  There is 1+ pitting edema to bilateral lower extremities.  There is periorbital edema with bilateral conjunctival injection and clear tearing ED Results / Procedures / Treatments   Labs (all labs ordered are listed, but only abnormal results are displayed) Labs Reviewed  CBC - Abnormal; Notable for the following components:      Result Value   RBC 4.16 (*)    All other components within normal limits  BASIC METABOLIC PANEL - Abnormal; Notable for the following components:   Glucose, Bld 131 (*)    All other components within normal limits  BRAIN NATRIURETIC PEPTIDE - Abnormal; Notable for the following components:   B Natriuretic Peptide 110.3 (*)    All other components within normal limits  URINALYSIS, W/ REFLEX TO CULTURE (INFECTION SUSPECTED) - Abnormal; Notable for the following components:   Color, Urine YELLOW (*)    APPearance HAZY (*)    Ketones, ur 5 (*)    Leukocytes,Ua TRACE (*)    Bacteria, UA RARE (*)    All other components within normal limits  RADIOLOGY ED MD interpretation: CT angiography of the chest interpreted independently by me and does not show any evidence of acute abnormalities.  Bilateral lower extremity Doppler ultrasound shows a nonocclusive proximal right femoral DVT  One-view portable chest x-ray interpreted by me shows no evidence of acute abnormalities including no pneumonia, pneumothorax, or widened mediastinum -Agree with radiology assessment Official radiology report(s):  CT Angio Chest PE W/Cm &/Or Wo Cm  Result Date: 02/26/2023 CLINICAL DATA:  Recent shoulder surgery, edema in both lower extremities EXAM: CT ANGIOGRAPHY CHEST WITH CONTRAST TECHNIQUE: Multidetector CT imaging of the chest was performed using the standard protocol during bolus administration of intravenous contrast. Multiplanar CT image reconstructions and MIPs were obtained to evaluate the vascular anatomy. RADIATION DOSE REDUCTION: This exam was performed according to the departmental dose-optimization program which includes automated exposure control, adjustment of the mA and/or kV according to patient size and/or use of iterative reconstruction technique.  CONTRAST:  75mL OMNIPAQUE IOHEXOL 350 MG/ML SOLN COMPARISON:  Chest radiograph done earlier today FINDINGS: Cardiovascular: There is homogeneous enhancement in thoracic aorta. Coronary artery calcifications are seen. There are no intraluminal filling defects in pulmonary artery branches. Mediastinum/Nodes: No significant lymphadenopathy is seen. Lungs/Pleura: Mucous secretions are noted in the dependent portion of lumen of trachea. Centrilobular and panlobular emphysema is seen. There are linear densities in both lower lung fields. In image 22 of series 6, there is a 8.5 mm nodular density in right apical region. Possible calcification is seen in this nodular density. Pleural densities are seen in posterior lower lung fields suggesting scarring or minimal pleural effusions. There is no r pneumothorax. Upper Abdomen: No acute findings are seen. Musculoskeletal: No acute findings are seen. Review of the MIP images confirms the above findings. IMPRESSION: There is no evidence of pulmonary artery embolism. There is no evidence of thoracic aortic dissection. Coronary artery calcifications are seen. Severe COPD. There are linear densities in both lower lung fields suggesting scarring or subsegmental atelectasis. Pleural densities are seen in the posterior lower lung fields suggesting scarring or minimal pleural effusions. There is a 8.5 mm nodular density in right upper lung field close to the apex. In 1 of the images, there is possible calcification in this nodular density. Follow-up CT in 3 months may be considered to assess stability or resolution. Electronically Signed   By: Ernie Avena M.D.   On: 02/26/2023 19:08   US Venous Img Lower Bilateral (DVT)  Result Date: 02/26/2023 CLINICAL DATA:  Edema in the bilateral extremities since recent surgery EXAM: BILATERAL LOWER EXTREMITY VENOUS DOPPLER ULTRASOUND TECHNIQUE: Gray-scale sonography with graded compression, as well as color Doppler and duplex ultrasound  were performed to evaluate the lower extremity deep venous systems from the level of the common femoral vein and including the common femoral, femoral, profunda femoral, popliteal and calf veins including the posterior tibial, peroneal and gastrocnemius veins when visible. The superficial great saphenous vein was also interrogated. Spectral Doppler was utilized to evaluate flow at rest and with distal augmentation maneuvers in the common femoral, femoral and popliteal veins. COMPARISON:  None Available. FINDINGS: RIGHT LOWER EXTREMITY Common Femoral Vein: No evidence of thrombus. Normal compressibility, respiratory phasicity and response to augmentation. Saphenofemoral Junction: No evidence of thrombus. Normal compressibility and flow on color Doppler imaging. Profunda Femoral Vein: No evidence of thrombus. Normal compressibility and flow on color Doppler imaging. Femoral Vein: Nonocclusive thrombus in the proximal right femoral vein. Noncompressible. Popliteal Vein: No evidence of thrombus. Normal compressibility, respiratory phasicity and response to augmentation. Calf Veins: No evidence of thrombus. Normal compressibility and flow on color Doppler imaging. Superficial Great Saphenous Vein: No evidence of thrombus. Normal compressibility. Venous Reflux:  None. Other Findings:  None. LEFT LOWER EXTREMITY Common Femoral Vein: No evidence of thrombus. Normal compressibility, respiratory phasicity and response to augmentation. Saphenofemoral Junction: No evidence of thrombus. Normal compressibility and flow on color  Doppler imaging. Profunda Femoral Vein: No evidence of thrombus. Normal compressibility and flow on color Doppler imaging. Femoral Vein: No evidence of thrombus. Normal compressibility, respiratory phasicity and response to augmentation. Popliteal Vein: No evidence of thrombus. Normal compressibility, respiratory phasicity and response to augmentation. Calf Veins: No evidence of thrombus. Normal  compressibility and flow on color Doppler imaging. Superficial Great Saphenous Vein: No evidence of thrombus. Normal compressibility. Venous Reflux:  None. Other Findings:  None. IMPRESSION: 1. Nonocclusive thrombus in the proximal right femoral vein. 2. No evidence of deep venous thrombosis in the left lower extremity. Electronically Signed   By: Wiliam Ke M.D.   On: 02/26/2023 17:54   DG Chest Port 1 View  Result Date: 02/26/2023 CLINICAL DATA:  Shortness of breath EXAM: PORTABLE CHEST 1 VIEW COMPARISON:  X-ray 10/15/2021. FINDINGS: Hyperinflation. There is some linear opacity left lung base likely scar or atelectasis. No consolidation, pneumothorax or effusion. No edema. Normal cardiopericardial silhouette. Chronic lung changes. Skin staples overlie the right shoulder. IMPRESSION: Hyperinflation with chronic lung changes and left basilar scar or atelectasis. Electronically Signed   By: Karen Kays M.D.   On: 02/26/2023 17:03   PROCEDURES: Critical Care performed: Yes, see critical care procedure note(s) .1-3 Lead EKG Interpretation  Performed by: Merwyn Katos, MD Authorized by: Merwyn Katos, MD     Interpretation: normal     ECG rate:  71   ECG rate assessment: normal     Rhythm: sinus rhythm     Ectopy: none     Conduction: normal    MEDICATIONS ORDERED IN ED: Medications  apixaban (ELIQUIS) tablet 10 mg (has no administration in time range)    Followed by  apixaban (ELIQUIS) tablet 5 mg (has no administration in time range)  iohexol (OMNIPAQUE) 350 MG/ML injection 75 mL (75 mLs Intravenous Contrast Given 02/26/23 1835)   IMPRESSION / MDM / ASSESSMENT AND PLAN / ED COURSE  I reviewed the triage vital signs and the nursing notes.                             The patient is on the cardiac monitor to evaluate for evidence of arrhythmia and/or significant heart rate changes. Patient's presentation is most consistent with acute presentation with potential threat to life or  bodily function. Presents with leg swelling and pain Nontoxic appearing, VSS. No lymphangitic spread visible. No fluid pockets or fluctuance concerning for abscess noted. Low concern for cellulitis or osteomyelitis. No evidence of phlegmasia cerulea or alba dolens. Focal and unilateral nature not consistent with heart failure.  Workup US Venous Doppler Lower Extremity  Rx: Eliquis starter pack  Disposition: Discharge with appropriate follow up Instructed patient to follow up with primary care physician in next 48 hours as well.   FINAL CLINICAL IMPRESSION(S) / ED DIAGNOSES   Final diagnoses:  Acute deep vein thrombosis (DVT) of femoral vein of right lower extremity (HCC)  Lower extremity edema  Periorbital edema of both eyes  Acute conjunctivitis of both eyes, unspecified acute conjunctivitis type   Rx / DC Orders   ED Discharge Orders          Ordered    APIXABAN (ELIQUIS) VTE STARTER PACK (10MG  AND 5MG )       Note to Pharmacy: If starter pack unavailable, substitute with seventy-four 5 mg apixaban tabs following the above SIG directions.   02/26/23 1923  Note:  This document was prepared using Dragon voice recognition software and may include unintentional dictation errors.   Merwyn Katos, MD 02/26/23 [phone]

## 2023-02-26 NOTE — ED Triage Notes (Signed)
Pt to ED EMS for bilateral edema to ankles and swelling around eyes since having rotator cuff surgery on 6/18. Pt wears 3L [place] at home.

## 2023-02-26 NOTE — Consult Note (Signed)
ANTICOAGULATION CONSULT NOTE - Initial Consult  Pharmacy Consult for Apixaban Indication: DVT  No Known Allergies  Patient Measurements: Height: 6' (182.9 cm) Weight: 59 kg (130 lb) IBW/kg (Calculated) : 77.6  Vital Signs: Temp: 98.3 F (36.8 C) (06/26 1456) BP: 116/82 (06/26 1459) Pulse Rate: 77 (06/26 1459)  Labs: Recent Labs    02/26/23 1459  HGB 13.1  HCT 41.1  PLT 209  CREATININE 1.15    Estimated Creatinine Clearance: 56.3 mL/min (by C-G formula based on SCr of 1.15 mg/dL).   Medical History: Past Medical History:  Diagnosis Date   COPD (chronic obstructive pulmonary disease) (HCC)    Dyspnea    GERD (gastroesophageal reflux disease)    no meds   Hx of migraines    Oxygen dependent    3 L [place]   Pneumonia 2023   Pre-diabetes    Pulmonary nodule    Rotator cuff tear, right    Tobacco use     Assessment: Benjamin Bolton is a 62 y.o. male presenting with bilateral ankle edema. PMH significant for rotator cuff repair on 02/18/2023. Patient was not on [facility] PTA per chart review. US revealed nonocclusive thrombus in the proximal right femoral vein. CTA negative for PE. Pharmacy has been consulted to dose apixaban.    Plan:  Start apixaban 10 mg BID x7d then 5 mg BID thereafter Pharmacy will sign off and continue to monitor peripherally     Celene Squibb, PharmD Clinical Pharmacist 02/26/2023 7:30 PM

## 2023-04-16 ENCOUNTER — Other Ambulatory Visit: Payer: Self-pay | Admitting: Specialist

## 2023-04-16 DIAGNOSIS — R911 Solitary pulmonary nodule: Secondary | ICD-10-CM

## 2023-04-17 ENCOUNTER — Ambulatory Visit
Admission: RE | Admit: 2023-04-17 | Discharge: 2023-04-17 | Disposition: A | Discharge status: Home / Self Care | Payer: 59 | Source: Ambulatory Visit | Attending: Specialist | Admitting: Specialist

## 2023-04-17 DIAGNOSIS — R911 Solitary pulmonary nodule: Secondary | ICD-10-CM | POA: Insufficient documentation

## 2023-09-03 DIAGNOSIS — J121 Respiratory syncytial virus pneumonia: Secondary | ICD-10-CM

## 2023-09-03 HISTORY — DX: Respiratory syncytial virus pneumonia: J12.1

## 2023-09-10 ENCOUNTER — Encounter: Payer: Self-pay | Admitting: Internal Medicine

## 2023-09-10 ENCOUNTER — Other Ambulatory Visit: Payer: Self-pay

## 2023-09-10 ENCOUNTER — Inpatient Hospital Stay
Admission: EM | Admit: 2023-09-10 | Discharge: 2023-10-29 | DRG: 207 | Disposition: A | Discharge status: Skilled Nursing Facility | Payer: 59 | Attending: Student | Admitting: Student

## 2023-09-10 ENCOUNTER — Emergency Department: Payer: 59

## 2023-09-10 DIAGNOSIS — R1310 Dysphagia, unspecified: Secondary | ICD-10-CM | POA: Diagnosis not present

## 2023-09-10 DIAGNOSIS — E1165 Type 2 diabetes mellitus with hyperglycemia: Secondary | ICD-10-CM | POA: Diagnosis present

## 2023-09-10 DIAGNOSIS — K567 Ileus, unspecified: Secondary | ICD-10-CM | POA: Diagnosis not present

## 2023-09-10 DIAGNOSIS — E876 Hypokalemia: Secondary | ICD-10-CM | POA: Diagnosis present

## 2023-09-10 DIAGNOSIS — I480 Paroxysmal atrial fibrillation: Secondary | ICD-10-CM | POA: Diagnosis not present

## 2023-09-10 DIAGNOSIS — E11649 Type 2 diabetes mellitus with hypoglycemia without coma: Secondary | ICD-10-CM | POA: Diagnosis not present

## 2023-09-10 DIAGNOSIS — Z66 Do not resuscitate: Secondary | ICD-10-CM | POA: Diagnosis present

## 2023-09-10 DIAGNOSIS — J9601 Acute respiratory failure with hypoxia: Secondary | ICD-10-CM | POA: Diagnosis not present

## 2023-09-10 DIAGNOSIS — D649 Anemia, unspecified: Secondary | ICD-10-CM | POA: Diagnosis not present

## 2023-09-10 DIAGNOSIS — D638 Anemia in other chronic diseases classified elsewhere: Secondary | ICD-10-CM | POA: Diagnosis present

## 2023-09-10 DIAGNOSIS — R197 Diarrhea, unspecified: Secondary | ICD-10-CM | POA: Insufficient documentation

## 2023-09-10 DIAGNOSIS — R7303 Prediabetes: Secondary | ICD-10-CM | POA: Diagnosis not present

## 2023-09-10 DIAGNOSIS — Y95 Nosocomial condition: Secondary | ICD-10-CM | POA: Diagnosis not present

## 2023-09-10 DIAGNOSIS — R748 Abnormal levels of other serum enzymes: Secondary | ICD-10-CM | POA: Diagnosis not present

## 2023-09-10 DIAGNOSIS — K219 Gastro-esophageal reflux disease without esophagitis: Secondary | ICD-10-CM | POA: Diagnosis present

## 2023-09-10 DIAGNOSIS — R7989 Other specified abnormal findings of blood chemistry: Secondary | ICD-10-CM | POA: Diagnosis not present

## 2023-09-10 DIAGNOSIS — R6520 Severe sepsis without septic shock: Secondary | ICD-10-CM | POA: Diagnosis not present

## 2023-09-10 DIAGNOSIS — R627 Adult failure to thrive: Secondary | ICD-10-CM | POA: Diagnosis present

## 2023-09-10 DIAGNOSIS — E43 Unspecified severe protein-calorie malnutrition: Secondary | ICD-10-CM | POA: Diagnosis present

## 2023-09-10 DIAGNOSIS — F1423 Cocaine dependence with withdrawal: Secondary | ICD-10-CM | POA: Diagnosis not present

## 2023-09-10 DIAGNOSIS — J45901 Unspecified asthma with (acute) exacerbation: Secondary | ICD-10-CM | POA: Diagnosis present

## 2023-09-10 DIAGNOSIS — Z515 Encounter for palliative care: Secondary | ICD-10-CM | POA: Diagnosis not present

## 2023-09-10 DIAGNOSIS — G936 Cerebral edema: Secondary | ICD-10-CM | POA: Diagnosis not present

## 2023-09-10 DIAGNOSIS — Z720 Tobacco use: Secondary | ICD-10-CM | POA: Diagnosis present

## 2023-09-10 DIAGNOSIS — F17210 Nicotine dependence, cigarettes, uncomplicated: Secondary | ICD-10-CM | POA: Diagnosis not present

## 2023-09-10 DIAGNOSIS — J121 Respiratory syncytial virus pneumonia: Secondary | ICD-10-CM | POA: Diagnosis present

## 2023-09-10 DIAGNOSIS — Z8701 Personal history of pneumonia (recurrent): Secondary | ICD-10-CM

## 2023-09-10 DIAGNOSIS — Z7952 Long term (current) use of systemic steroids: Secondary | ICD-10-CM

## 2023-09-10 DIAGNOSIS — R64 Cachexia: Secondary | ICD-10-CM | POA: Diagnosis present

## 2023-09-10 DIAGNOSIS — R000 Tachycardia, unspecified: Secondary | ICD-10-CM | POA: Diagnosis not present

## 2023-09-10 DIAGNOSIS — I5A Non-ischemic myocardial injury (non-traumatic): Secondary | ICD-10-CM | POA: Diagnosis present

## 2023-09-10 DIAGNOSIS — Z5986 Financial insecurity: Secondary | ICD-10-CM

## 2023-09-10 DIAGNOSIS — J9621 Acute and chronic respiratory failure with hypoxia: Secondary | ICD-10-CM | POA: Diagnosis present

## 2023-09-10 DIAGNOSIS — R509 Fever, unspecified: Secondary | ICD-10-CM

## 2023-09-10 DIAGNOSIS — R4702 Dysphasia: Secondary | ICD-10-CM | POA: Diagnosis present

## 2023-09-10 DIAGNOSIS — I824Y9 Acute embolism and thrombosis of unspecified deep veins of unspecified proximal lower extremity: Secondary | ICD-10-CM | POA: Diagnosis not present

## 2023-09-10 DIAGNOSIS — J439 Emphysema, unspecified: Secondary | ICD-10-CM | POA: Diagnosis present

## 2023-09-10 DIAGNOSIS — B3789 Other sites of candidiasis: Secondary | ICD-10-CM | POA: Diagnosis not present

## 2023-09-10 DIAGNOSIS — R3915 Urgency of urination: Secondary | ICD-10-CM | POA: Diagnosis present

## 2023-09-10 DIAGNOSIS — J440 Chronic obstructive pulmonary disease with acute lower respiratory infection: Secondary | ICD-10-CM | POA: Diagnosis present

## 2023-09-10 DIAGNOSIS — E878 Other disorders of electrolyte and fluid balance, not elsewhere classified: Secondary | ICD-10-CM

## 2023-09-10 DIAGNOSIS — G928 Other toxic encephalopathy: Secondary | ICD-10-CM | POA: Diagnosis not present

## 2023-09-10 DIAGNOSIS — Z7901 Long term (current) use of anticoagulants: Secondary | ICD-10-CM

## 2023-09-10 DIAGNOSIS — J690 Pneumonitis due to inhalation of food and vomit: Secondary | ICD-10-CM | POA: Diagnosis present

## 2023-09-10 DIAGNOSIS — Z1152 Encounter for screening for COVID-19: Secondary | ICD-10-CM | POA: Diagnosis not present

## 2023-09-10 DIAGNOSIS — F419 Anxiety disorder, unspecified: Secondary | ICD-10-CM | POA: Diagnosis present

## 2023-09-10 DIAGNOSIS — N3000 Acute cystitis without hematuria: Secondary | ICD-10-CM | POA: Diagnosis not present

## 2023-09-10 DIAGNOSIS — A419 Sepsis, unspecified organism: Secondary | ICD-10-CM | POA: Diagnosis not present

## 2023-09-10 DIAGNOSIS — E785 Hyperlipidemia, unspecified: Secondary | ICD-10-CM | POA: Insufficient documentation

## 2023-09-10 DIAGNOSIS — E782 Mixed hyperlipidemia: Secondary | ICD-10-CM | POA: Diagnosis not present

## 2023-09-10 DIAGNOSIS — E871 Hypo-osmolality and hyponatremia: Secondary | ICD-10-CM | POA: Diagnosis present

## 2023-09-10 DIAGNOSIS — N390 Urinary tract infection, site not specified: Secondary | ICD-10-CM | POA: Diagnosis present

## 2023-09-10 DIAGNOSIS — B370 Candidal stomatitis: Secondary | ICD-10-CM | POA: Diagnosis not present

## 2023-09-10 DIAGNOSIS — E8809 Other disorders of plasma-protein metabolism, not elsewhere classified: Secondary | ICD-10-CM | POA: Diagnosis present

## 2023-09-10 DIAGNOSIS — R579 Shock, unspecified: Secondary | ICD-10-CM | POA: Diagnosis not present

## 2023-09-10 DIAGNOSIS — Z681 Body mass index (BMI) 19 or less, adult: Secondary | ICD-10-CM

## 2023-09-10 DIAGNOSIS — L89150 Pressure ulcer of sacral region, unstageable: Secondary | ICD-10-CM | POA: Diagnosis not present

## 2023-09-10 DIAGNOSIS — J441 Chronic obstructive pulmonary disease with (acute) exacerbation: Secondary | ICD-10-CM | POA: Diagnosis not present

## 2023-09-10 DIAGNOSIS — R54 Age-related physical debility: Secondary | ICD-10-CM | POA: Diagnosis present

## 2023-09-10 DIAGNOSIS — E875 Hyperkalemia: Secondary | ICD-10-CM | POA: Diagnosis not present

## 2023-09-10 DIAGNOSIS — Z86718 Personal history of other venous thrombosis and embolism: Secondary | ICD-10-CM

## 2023-09-10 DIAGNOSIS — J9622 Acute and chronic respiratory failure with hypercapnia: Secondary | ICD-10-CM | POA: Diagnosis present

## 2023-09-10 DIAGNOSIS — J9602 Acute respiratory failure with hypercapnia: Secondary | ICD-10-CM | POA: Diagnosis not present

## 2023-09-10 DIAGNOSIS — B974 Respiratory syncytial virus as the cause of diseases classified elsewhere: Secondary | ICD-10-CM | POA: Diagnosis not present

## 2023-09-10 DIAGNOSIS — R490 Dysphonia: Secondary | ICD-10-CM | POA: Diagnosis not present

## 2023-09-10 DIAGNOSIS — Z794 Long term (current) use of insulin: Secondary | ICD-10-CM

## 2023-09-10 DIAGNOSIS — Z635 Disruption of family by separation and divorce: Secondary | ICD-10-CM

## 2023-09-10 DIAGNOSIS — R5081 Fever presenting with conditions classified elsewhere: Secondary | ICD-10-CM | POA: Diagnosis not present

## 2023-09-10 DIAGNOSIS — E872 Acidosis, unspecified: Secondary | ICD-10-CM | POA: Diagnosis present

## 2023-09-10 DIAGNOSIS — Z9981 Dependence on supplemental oxygen: Secondary | ICD-10-CM

## 2023-09-10 DIAGNOSIS — E440 Moderate protein-calorie malnutrition: Secondary | ICD-10-CM | POA: Diagnosis present

## 2023-09-10 DIAGNOSIS — G9341 Metabolic encephalopathy: Secondary | ICD-10-CM | POA: Diagnosis not present

## 2023-09-10 DIAGNOSIS — I82409 Acute embolism and thrombosis of unspecified deep veins of unspecified lower extremity: Secondary | ICD-10-CM | POA: Diagnosis present

## 2023-09-10 HISTORY — DX: Acute embolism and thrombosis of unspecified deep veins of unspecified lower extremity: I82.409

## 2023-09-10 HISTORY — DX: Tobacco use: Z72.0

## 2023-09-10 HISTORY — DX: Hyperlipidemia, unspecified: E78.5

## 2023-09-10 LAB — URINALYSIS, W/ REFLEX TO CULTURE (INFECTION SUSPECTED)
Bilirubin Urine: NEGATIVE
Glucose, UA: >=500 mg/dL — AB
Hgb urine dipstick: NEGATIVE
Ketones, ur: 20 mg/dL — AB
Nitrite: NEGATIVE
Protein, ur: 30 mg/dL — AB
Specific Gravity, Urine: 1.024 (ref 1.005–1.030)
pH: 5 (ref 5.0–8.0)

## 2023-09-10 LAB — COMPREHENSIVE METABOLIC PANEL
ALT: 20 U/L (ref 0–44)
AST: 20 U/L (ref 15–41)
Albumin: 3.8 g/dL (ref 3.5–5.0)
Alkaline Phosphatase: 79 U/L (ref 38–126)
Anion gap: 13 (ref 5–15)
BUN: 12 mg/dL (ref 8–23)
CO2: 24 mmol/L (ref 22–32)
Calcium: 9 mg/dL (ref 8.9–10.3)
Chloride: 101 mmol/L (ref 98–111)
Creatinine, Ser: 0.69 mg/dL (ref 0.61–1.24)
GFR, Estimated: >60 mL/min (ref >60)
Glucose, Bld: 162 mg/dL — ABNORMAL HIGH (ref 70–99)
Potassium: 3.7 mmol/L (ref 3.5–5.1)
Sodium: 138 mmol/L (ref 135–145)
Total Bilirubin: 1 mg/dL (ref 0.0–1.2)
Total Protein: 7.1 g/dL (ref 6.5–8.1)

## 2023-09-10 LAB — CBC WITH DIFFERENTIAL/PLATELET
Abs Immature Granulocytes: 0.13 10*3/uL — ABNORMAL HIGH (ref 0.00–0.07)
Basophils Absolute: 0 10*3/uL (ref 0.0–0.1)
Basophils Relative: 0 %
Eosinophils Absolute: 0 10*3/uL (ref 0.0–0.5)
Eosinophils Relative: 0 %
HCT: 46.8 % (ref 39.0–52.0)
Hemoglobin: 15.2 g/dL (ref 13.0–17.0)
Immature Granulocytes: 1 %
Lymphocytes Relative: 6 %
Lymphs Abs: 0.9 10*3/uL (ref 0.7–4.0)
MCH: 30.8 pg (ref 26.0–34.0)
MCHC: 32.5 g/dL (ref 30.0–36.0)
MCV: 94.7 fL (ref 80.0–100.0)
Monocytes Absolute: 0.8 10*3/uL (ref 0.1–1.0)
Monocytes Relative: 6 %
Neutro Abs: 12.4 10*3/uL — ABNORMAL HIGH (ref 1.7–7.7)
Neutrophils Relative %: 87 %
Platelets: 242 10*3/uL (ref 150–400)
RBC: 4.94 MIL/uL (ref 4.22–5.81)
RDW: 14.6 % (ref 11.5–15.5)
WBC: 14.2 10*3/uL — ABNORMAL HIGH (ref 4.0–10.5)
nRBC: 0 % (ref 0.0–0.2)

## 2023-09-10 LAB — PROTIME-INR
INR: 1.2 (ref 0.8–1.2)
Prothrombin Time: 15.4 s — ABNORMAL HIGH (ref 11.4–15.2)

## 2023-09-10 LAB — LACTIC ACID, PLASMA
Lactic Acid, Venous: 1.6 mmol/L (ref 0.5–1.9)
Lactic Acid, Venous: 2.7 mmol/L (ref 0.5–1.9)
Lactic Acid, Venous: 1.1 mmol/L (ref 0.5–1.9)
Lactic Acid, Venous: 1.8 mmol/L (ref 0.5–1.9)

## 2023-09-10 LAB — TROPONIN I (HIGH SENSITIVITY): Troponin I (High Sensitivity): 9 ng/L (ref <18)

## 2023-09-10 MED ORDER — DM-GUAIFENESIN ER 30-600 MG PO TB12
1.0000 | ORAL_TABLET | Freq: Two times a day (BID) | ORAL | Status: DC | PRN
  Administered 2023-09-11 – 2023-09-12 (×4): 1 via ORAL
  Filled 2023-09-10 (×4): qty 1

## 2023-09-10 MED ORDER — ENOXAPARIN SODIUM 40 MG/0.4ML IJ SOSY
40.0000 mg | PREFILLED_SYRINGE | INTRAMUSCULAR | Status: DC
  Administered 2023-09-10: 40 mg via SUBCUTANEOUS
  Filled 2023-09-10: qty 0.4

## 2023-09-10 MED ORDER — IPRATROPIUM-ALBUTEROL 0.5-2.5 (3) MG/3ML IN SOLN
3.0000 mL | Freq: Once | RESPIRATORY_TRACT | Status: AC
  Administered 2023-09-10: 3 mL via RESPIRATORY_TRACT
  Filled 2023-09-10: qty 3

## 2023-09-10 MED ORDER — SODIUM CHLORIDE 0.9 % IV BOLUS
1000.0000 mL | Freq: Once | INTRAVENOUS | Status: AC
  Administered 2023-09-10: 1000 mL via INTRAVENOUS

## 2023-09-10 MED ORDER — NICOTINE 21 MG/24HR TD PT24
21.0000 mg | MEDICATED_PATCH | Freq: Every day | TRANSDERMAL | Status: DC
  Administered 2023-09-11 – 2023-09-13 (×3): 21 mg via TRANSDERMAL
  Filled 2023-09-10 (×3): qty 1

## 2023-09-10 MED ORDER — SODIUM CHLORIDE 0.9 % IV SOLN
1.0000 g | INTRAVENOUS | Status: DC
  Administered 2023-09-10 – 2023-09-12 (×3): 1 g via INTRAVENOUS
  Filled 2023-09-10 (×4): qty 10

## 2023-09-10 MED ORDER — METHYLPREDNISOLONE SODIUM SUCC 40 MG IJ SOLR
40.0000 mg | INTRAMUSCULAR | Status: DC
  Administered 2023-09-11: 40 mg via INTRAVENOUS
  Filled 2023-09-10: qty 1

## 2023-09-10 MED ORDER — ENSURE ENLIVE PO LIQD
237.0000 mL | Freq: Two times a day (BID) | ORAL | Status: DC
  Administered 2023-09-11 – 2023-09-12 (×3): 237 mL via ORAL

## 2023-09-10 MED ORDER — OXYCODONE HCL 5 MG PO TABS
5.0000 mg | ORAL_TABLET | ORAL | Status: DC | PRN
  Administered 2023-09-11 – 2023-09-13 (×7): 5 mg via ORAL
  Filled 2023-09-10 (×7): qty 1

## 2023-09-10 MED ORDER — IPRATROPIUM-ALBUTEROL 0.5-2.5 (3) MG/3ML IN SOLN
3.0000 mL | RESPIRATORY_TRACT | Status: DC
  Administered 2023-09-10 – 2023-09-12 (×10): 3 mL via RESPIRATORY_TRACT
  Filled 2023-09-10 (×11): qty 3

## 2023-09-10 MED ORDER — HYDRALAZINE HCL 20 MG/ML IJ SOLN: 5.0000 mg | INTRAMUSCULAR | Status: DC | PRN

## 2023-09-10 MED ORDER — ONDANSETRON HCL 4 MG/2ML IJ SOLN
4.0000 mg | Freq: Three times a day (TID) | INTRAMUSCULAR | Status: DC | PRN
  Administered 2023-10-23 – 2023-10-25 (×2): 4 mg via INTRAVENOUS
  Filled 2023-09-10 (×3): qty 2

## 2023-09-10 MED ORDER — ALBUTEROL SULFATE (2.5 MG/3ML) 0.083% IN NEBU
2.5000 mg | INHALATION_SOLUTION | RESPIRATORY_TRACT | Status: DC | PRN
  Administered 2023-09-12: 2.5 mg via RESPIRATORY_TRACT
  Filled 2023-09-10: qty 3

## 2023-09-10 MED ORDER — METHYLPREDNISOLONE SODIUM SUCC 125 MG IJ SOLR
125.0000 mg | Freq: Once | INTRAMUSCULAR | Status: AC
  Administered 2023-09-10: 125 mg via INTRAVENOUS
  Filled 2023-09-10: qty 2

## 2023-09-10 MED ORDER — ACETAMINOPHEN 325 MG PO TABS
650.0000 mg | ORAL_TABLET | Freq: Four times a day (QID) | ORAL | Status: DC | PRN
  Administered 2023-09-12: 650 mg via ORAL
  Filled 2023-09-10: qty 2

## 2023-09-10 NOTE — ED Triage Notes (Signed)
 Pt sts that he was over at the [facility] to have an follow up after being dx with pneumonia. Pt is having labored breathing at this time. PT is able to talk in complete sentences at this time.

## 2023-09-10 NOTE — ED Notes (Signed)
 Pt removed all monitoring and [place] and ambulated to the bathroom. Pt then yelling for help stating "I need air". Pt connected to portable oxygen. Pt obviously tachypneic.

## 2023-09-10 NOTE — ED Provider Notes (Signed)
 [facility] Provider Note    Event Date/Time   First MD Initiated Contact with Patient 09/10/23 1459     (approximate)   History   Shortness of Breath   HPI  Benjamin Bolton is a 63 y.o. male with history of emphysema with recent admission to the hospital for pneumonia and COPD exacerbation wearing 3 L nasal cannula chronically presents to the ER for evaluation recurrence of shortness of breath.  He denies any pleuritic pain.  No productive cough.  Is having worsening exertional dyspnea.  Feels improved at rest.  Was still follow-up with pulmonology today but was so dyspneic in the parking lot he came to the ER.     Physical Exam   Triage Vital Signs: ED Triage Vitals  Encounter Vitals Group     BP 09/10/23 1304 104/73     Systolic BP Percentile --      Diastolic BP Percentile --      Pulse Rate 09/10/23 1301 (!) 134     Resp 09/10/23 1301 (!) 23     Temp 09/10/23 1304 98.6 F (37 C)     Temp Source 09/10/23 1301 Oral     SpO2 09/10/23 1301 90 %     Weight 09/10/23 1302 134 lb (60.8 kg)     Height 09/10/23 1302 6' (1.829 m)     Head Circumference --      Peak Flow --      Pain Score 09/10/23 1302 0     Pain Loc --      Pain Education --      Exclude from Growth Chart --     Most recent vital signs: Vitals:   09/10/23 1824 09/10/23 1830  BP:  106/68  Pulse:  (!) 105  Resp:  17  Temp: 98.8 F (37.1 C)   SpO2:  98%     Constitutional: Alert  Eyes: Conjunctivae are normal.  Head: Atraumatic. Nose: No congestion/rhinnorhea. Mouth/Throat: Mucous membranes are moist.   Neck: Painless ROM.  Cardiovascular:   Good peripheral circulation. Respiratory: Mild tachypnea with prolonged expiratory phase.  Coarse breath sounds throughout.  No crackles. Gastrointestinal: Soft and nontender.  Musculoskeletal:  no deformity Neurologic:  MAE spontaneously. No gross focal neurologic deficits are appreciated.  Skin:  Skin is warm, dry and intact. No  rash noted. Psychiatric: Mood and affect are normal. Speech and behavior are normal.    ED Results / Procedures / Treatments   Labs (all labs ordered are listed, but only abnormal results are displayed) Labs Reviewed  COMPREHENSIVE METABOLIC PANEL - Abnormal; Notable for the following components:      Result Value   Glucose, Bld 162 (*)    All other components within normal limits  LACTIC ACID, PLASMA - Abnormal; Notable for the following components:   Lactic Acid, Venous 2.7 (*)    All other components within normal limits  CBC WITH DIFFERENTIAL/PLATELET - Abnormal; Notable for the following components:   WBC 14.2 (*)    Neutro Abs 12.4 (*)    Abs Immature Granulocytes 0.13 (*)    All other components within normal limits  PROTIME-INR - Abnormal; Notable for the following components:   Prothrombin Time 15.4 (*)    All other components within normal limits  URINALYSIS, W/ REFLEX TO CULTURE (INFECTION SUSPECTED) - Abnormal; Notable for the following components:   Color, Urine YELLOW (*)    APPearance HAZY (*)    Glucose, UA >=500 (*)  Ketones, ur 20 (*)    Protein, ur 30 (*)    Leukocytes,Ua SMALL (*)    Bacteria, UA RARE (*)    All other components within normal limits  CULTURE, BLOOD (ROUTINE X 2)  CULTURE, BLOOD (ROUTINE X 2)  URINE CULTURE  LACTIC ACID, PLASMA  LACTIC ACID, PLASMA  LACTIC ACID, PLASMA  TROPONIN I (HIGH SENSITIVITY)  TROPONIN I (HIGH SENSITIVITY)     EKG  ED ECG REPORT I, Belvie Essex, the attending physician, personally viewed and interpreted this ECG.   Date: 09/10/2023  EKG Time: 13:06  Rate: 130  Rhythm: sinus  Axis: normal  Intervals: normal  ST&T Change: no stemi, no depression    RADIOLOGY Please see ED Course for my review and interpretation.  I personally reviewed all radiographic images ordered to evaluate for the above acute complaints and reviewed radiology reports and findings.  These findings were personally  discussed with the patient.  Please see medical record for radiology report.    PROCEDURES:  Critical Care performed:   Procedures   MEDICATIONS ORDERED IN ED: Medications  ipratropium-albuterol  (DUONEB) 0.5-2.5 (3) MG/3ML nebulizer solution 3 mL (has no administration in time range)  ipratropium-albuterol  (DUONEB) 0.5-2.5 (3) MG/3ML nebulizer solution 3 mL (3 mLs Nebulization Given 09/10/23 1531)  methylPREDNISolone  sodium succinate  (SOLU-MEDROL ) 125 mg/2 mL injection 125 mg (125 mg Intravenous Given 09/10/23 1531)  sodium chloride  0.9 % bolus 1,000 mL (0 mLs Intravenous Stopped 09/10/23 1918)  ipratropium-albuterol  (DUONEB) 0.5-2.5 (3) MG/3ML nebulizer solution 3 mL (3 mLs Nebulization Given 09/10/23 1758)     IMPRESSION / MDM / ASSESSMENT AND PLAN / ED COURSE  I reviewed the triage vital signs and the nursing notes.                              Differential diagnosis includes, but is not limited to, Asthma, copd, CHF, pna, ptx, malignancy, Pe, anemia  Patient presenting to the ER for evaluation of symptoms as described above.  Based on symptoms, risk factors and considered above differential, this presenting complaint could reflect a potentially life-threatening illness therefore the patient will be placed on continuous pulse oximetry and telemetry for monitoring.  Laboratory evaluation will be sent to evaluate for the above complaints.  Patient's presentation appears consistent with acute COPD exacerbation will give nebulizer treatment as well as steroid.  Chest x-ray on my review and interpretation without evidence of consolidation or pneumothorax.  Low suspicion for PE as the patient is on anticoagulation and states he has been compliant with this.    Clinical Course as of 09/10/23 2049  Wed Sep 10, 2023  1635 Lactate is mildly elevated and I think is more not reflective of hypoxia during ambulation earlier than infection we will give some IV fluids and repeat. [PR]  1958 Patient  with significant dyspnea even with short exertion with O2 sats dropping into the low 80s and 70s.  Will give additional breath treatments.  At rest I do not believe he requires BiPAP but I do not believe he is stable for discharge home at this time therefore will consult hospitalist. [PR]    Clinical Course User Index [PR] Essex Belvie, MD     FINAL CLINICAL IMPRESSION(S) / ED DIAGNOSES   Final diagnoses:  COPD exacerbation (HCC)     Rx / DC Orders   ED Discharge Orders     None        Note:  This document was prepared using Dragon voice recognition software and may include unintentional dictation errors.    Lang Dover, MD 09/10/23 2049

## 2023-09-10 NOTE — ED Notes (Signed)
 Patient attempted to ambulate to the toilet in the room on 3 liters of oxygen and started having respiratory distress with difficulty catching is breath and talking,  took approximately 10 mins with 02 on 5l to return to baseline

## 2023-09-10 NOTE — ED Notes (Signed)
 Pt yelling at RN stating I need a breathing treatment, now! Pts sats were checked, 100% on 6L [place], o2 tank was assessed to assure it still has o2 in it. Explained to patient that sats are good, pt yelled well when I fall dead out here, yall be ready for a lawsuit. Advised pt that I was keeping up with lab results and will get patient roomed as soon as possible.

## 2023-09-10 NOTE — ED Notes (Signed)
 Dr. Roxan Hockey aware of Lactic Acid result.

## 2023-09-10 NOTE — ED Provider Triage Note (Signed)
 Emergency Medicine Provider Triage Evaluation Note  Jeramie Scogin , a 63 y.o. male  was evaluated in triage.  Pt complains of shortness of breath. Recently diagnosed with pneumonia. Currently on 3 liters oxygen. Shortness of breath got worse today.   Physical Exam  BP 104/73 (BP Location: Right Arm)   Pulse (!) 134   Temp 98.6 F (37 C) (Oral)   Resp (!) 23   Ht 6' (1.829 m)   Wt 60.8 kg   SpO2 90%   BMI 18.17 kg/m  Gen:   Awake, no distress   Resp:  Normal effort  MSK:   Moves extremities without difficulty  Other:    Medical Decision Making  Medically screening exam initiated at 1:07 PM.  Appropriate orders placed.  Sou Nohr was informed that the remainder of the evaluation will be completed by another provider, this initial triage assessment does not replace that evaluation, and the importance of remaining in the ED until their evaluation is complete.  EKG shows tachycardia at 134. Labs including lactic sent.   Herlinda Kirk NOVAK, FNP 09/10/23 1311

## 2023-09-10 NOTE — H&P (Signed)
 History and Physical    Benjamin Bolton FMW:[id number] DOB: 1961/03/27 DOA: 09/10/2023  Referring MD/NP/PA:   PCP: [facility]   Patient coming from:  The patient is coming from home.     Chief Complaint: SOB and increased urinary frequency.  HPI: Benjamin Bolton is a 63 y.o. male with medical history significant of COPD on 3L O2, HLD, pre-DM, DVT on Eliquis , who presents with SOB.  Patient states that he has a shortness of breath for more than 3 days, which has been progressively worsening.  He has cough with thick mucus production.  He has chills, but no fever.  No chest pain.  No nausea, vomiting, diarrhea or abdominal pain.  He denies dysuria or burning with urination, but reports increased urinary frequency. Pt was seen in [facility], and was sent to ED due to concerning for pneumonia.  Of note, patient is using 3 L oxygen normally at home, but was found to have acute respiratory distress, difficulty speaking in full sentence, oxygen desaturation to 80s, initially started on 5-6 L oxygen, which is weaned off to home level 3 L oxygen after giving IV Solu-Medrol  and nebulizer in ED.   Data reviewed independently and ED Course: pt was found to have WBC 14.2, GFR> 60, positive urinalysis (hazy appearance, small amount of leukocyte, rare bacteria, WBC 11-20), temperature normal, soft blood pressure 97/73, heart rate 134 --> 108, RR 23.  Lactic acid 1.6 --> 2.7--> 1.1 --> 1.8 in ED. chest x-ray negative for infiltration.  Patient is admitted to tele bed as inpatient   EKG: I have personally reviewed.  Sinus rhythm, QTc 428, LAE, low voltage, poor IV progression   Review of Systems:   General: no fevers, has chills, no body weight gain, has fatigue HEENT: no blurry vision, hearing changes or sore throat Respiratory: has dyspnea, coughing, wheezing CV: no chest pain, no palpitations GI: no nausea, vomiting, abdominal pain, diarrhea, constipation GU: no dysuria, burning on urination, has  increased urinary frequency Ext: no leg edema Neuro: no unilateral weakness, numbness, or tingling, no vision change or hearing loss Skin: no rash, no skin tear. MSK: No muscle spasm, no deformity, no limitation of range of movement in spin Heme: No easy bruising.  Travel history: No recent long distant travel.   Allergy:  Allergies  Allergen Reactions   Pork-Derived Products Hives and Other (See Comments)    HIVES    Past Medical History:  Diagnosis Date   COPD (chronic obstructive pulmonary disease) (HCC)    DVT (deep venous thrombosis) (HCC)    Dyspnea    GERD (gastroesophageal reflux disease)    no meds   HLD (hyperlipidemia)    Hx of migraines    Oxygen dependent    3 L [place]   Pneumonia 2023   Pre-diabetes    Pulmonary nodule    Rotator cuff tear, right    Tobacco abuse    Tobacco use     Past Surgical History:  Procedure Laterality Date   CHEST TUBE INSERTION     SHOULDER ARTHROSCOPY WITH SUBACROMIAL DECOMPRESSION, ROTATOR CUFF REPAIR AND BICEP TENDON REPAIR Right 02/18/2023   Procedure: RIGHT SHOULDER ARTHROSCOPY WITH DEBRIDEMENT, DECOMPRESSION, ROTATOR CUFF REPAIR, BICEPS TENODESIS.;  Surgeon: Edie Norleen PARAS, MD;  Location: [facility] ORS;  Service: Orthopedics;  Laterality: Right;    Social History:  reports that he has been smoking cigarettes. He has a 12 pack-year smoking history. He has never used smokeless tobacco. He reports current alcohol  use. He reports  that he does not use drugs.  Family History: No family history on file.   Prior to Admission medications   Medication Sig Start Date End Date Taking? Authorizing Provider  albuterol  (PROVENTIL ) (2.5 MG/3ML) 0.083% nebulizer solution USE 1 VIAL IN NEBULIZER EVERY 6 HOURS AS NEEDED FOR WHEEZING 06/28/21   [provider]  APIXABAN  (ELIQUIS ) VTE STARTER PACK (10MG  AND 5MG ) Take as directed on package: start with two-5mg  tablets twice daily for 7 days. On day 8, switch to one-5mg  tablet twice daily. 02/26/23    Bradler, Evan K, MD  Multiple Vitamin (MULTIVITAMIN) tablet Take 1 tablet by mouth 3 (three) times a week.    [provider]  ondansetron  (ZOFRAN ) 4 MG tablet Take 1 tablet (4 mg total) by mouth every 6 (six) hours as needed for nausea. 02/19/23   Kip Lynwood Double, PA-C  oxyCODONE  (OXY IR/ROXICODONE ) 5 MG immediate release tablet Take 1-2 tablets (5-10 mg total) by mouth every 4 (four) hours as needed for severe pain. 02/19/23   Kip Lynwood Double, PA-C  OXYGEN Inhale 3 L into the lungs continuous.    [provider]  predniSONE  (DELTASONE ) 20 MG tablet Take 20 mg by mouth daily with breakfast.    [provider]  TRELEGY ELLIPTA 100-62.5-25 MCG/ACT AEPB Inhale 1 puff into the lungs every morning. 07/04/21   [provider]    Physical Exam: Vitals:   09/10/23 1824 09/10/23 1830 09/10/23 2100 09/10/23 2239  BP:  106/68 97/73   Pulse:  (!) 105 (!) 108   Resp:  17    Temp: 98.8 F (37.1 C)   98.5 F (36.9 C)  TempSrc: Oral   Oral  SpO2:  98% 100%   Weight:      Height:       General: Has moderate acute respiratory distress  HEENT:       Eyes: PERRL, EOMI, no jaundice       ENT: No discharge from the ears and nose, no pharynx injection, no tonsillar enlargement.        Neck: No JVD, no bruit, no mass felt. Heme: No neck lymph node enlargement. Cardiac: S1/S2, RRR, No murmurs, No gallops or rubs. Respiratory: Has wheezing bilaterally GI: Soft, nondistended, nontender, no rebound pain, no organomegaly, BS present. GU: No hematuria Ext: No pitting leg edema bilaterally. 1+DP/PT pulse bilaterally. Musculoskeletal: No joint deformities, No joint redness or warmth, no limitation of ROM in spin. Skin: No rashes.  Neuro: Alert, oriented X3, cranial nerves II-XII grossly intact, moves all extremities normally. Psych: Patient is not psychotic, no suicidal or hemocidal ideation.  Labs on Admission: I have personally reviewed following labs and imaging  studies  CBC: Recent Labs  Lab 09/10/23 1312  WBC 14.2*  NEUTROABS 12.4*  HGB 15.2  HCT 46.8  MCV 94.7  PLT 242   Basic Metabolic Panel: Recent Labs  Lab 09/10/23 1312  NA 138  K 3.7  CL 101  CO2 24  GLUCOSE 162*  BUN 12  CREATININE 0.69  CALCIUM 9.0   GFR: Estimated Creatinine Clearance: 83.4 mL/min (by C-G formula based on SCr of 0.69 mg/dL). Liver Function Tests: Recent Labs  Lab 09/10/23 1312  AST 20  ALT 20  ALKPHOS 79  BILITOT 1.0  PROT 7.1  ALBUMIN  3.8   No results for input(s): LIPASE, AMYLASE in the last 168 hours. No results for input(s): AMMONIA in the last 168 hours. Coagulation Profile: Recent Labs  Lab 09/10/23 1312  INR 1.2  Cardiac Enzymes: No results for input(s): CKTOTAL, CKMB, CKMBINDEX, TROPONINI in the last 168 hours. BNP (last 3 results) No results for input(s): PROBNP in the last 8760 hours. HbA1C: No results for input(s): HGBA1C in the last 72 hours. CBG: No results for input(s): GLUCAP in the last 168 hours. Lipid Profile: No results for input(s): CHOL, HDL, LDLCALC, TRIG, CHOLHDL, LDLDIRECT in the last 72 hours. Thyroid Function Tests: No results for input(s): TSH, T4TOTAL, FREET4, T3FREE, THYROIDAB in the last 72 hours. Anemia Panel: No results for input(s): VITAMINB12, FOLATE, FERRITIN, TIBC, IRON , RETICCTPCT in the last 72 hours. Urine analysis:    Component Value Date/Time   COLORURINE YELLOW (A) 09/10/2023 1953   APPEARANCEUR HAZY (A) 09/10/2023 1953   LABSPEC 1.024 09/10/2023 1953   PHURINE 5.0 09/10/2023 1953   GLUCOSEU >=500 (A) 09/10/2023 1953   HGBUR NEGATIVE 09/10/2023 1953   BILIRUBINUR NEGATIVE 09/10/2023 1953   KETONESUR 20 (A) 09/10/2023 1953   PROTEINUR 30 (A) 09/10/2023 1953   NITRITE NEGATIVE 09/10/2023 1953   LEUKOCYTESUR SMALL (A) 09/10/2023 1953   Sepsis Labs: @LABRCNTIP (procalcitonin:4,lacticidven:4) )No results found for this or any  previous visit (from the past 240 hours).   Radiological Exams on Admission:   Assessment/Plan Principal Problem:   COPD exacerbation (HCC) Active Problems:   Acute on chronic respiratory failure with hypoxia (HCC)   DVT (deep venous thrombosis) (HCC)   HLD (hyperlipidemia)   UTI (urinary tract infection)   Tobacco abuse   Protein-calorie malnutrition, severe (HCC)   Assessment and Plan:   Acute on chronic respiratory failure with hypoxia due to COPD exacerbation: No pneumonia by chest x-ray.  -Will admit to tele bed as inpatient -Bronchodilators -Solu-Medrol  125 mg, then 40 mg bid - pt is on IV rocephin  which is for UTI -Mucinex  for cough  -Incentive spirometry -sputum culture -Nasal cannula oxygen as needed to maintain O2 saturation 93% or greater  Hx of DVT (deep venous thrombosis) (HCC): -Eliquis   HLD (hyperlipidemia) -Crestor  UTI (urinary tract infection) -IV rocephin  -f/u Urine culture  Tobacco abuse: Patient states that he stopped smoking about 1 month ago. -Nicotine  patch -Did counseling about importance of not smoking again  Protein-calorie malnutrition, severe (HCC): Body weight 60.8 kg, BMI 18.17 -Ensure -Nutrition consult      DVT ppx: on Eliquis   Code Status: Full code   Family Communication: not done, no family member is at bed side.       Disposition Plan:  Anticipate discharge back to previous environment  Consults called:  none  Admission status and Level of care: Telemetry Medical:  as inpt        Dispo: The patient is from: Home              Anticipated d/c is to: Home              Anticipated d/c date is: 2 days              Patient currently is not medically stable to d/c.    Severity of Illness:  The appropriate patient status for this patient is INPATIENT. Inpatient status is judged to be reasonable and necessary in order to provide the required intensity of service to ensure the patient's safety. The patient's  presenting symptoms, physical exam findings, and initial radiographic and laboratory data in the context of their chronic comorbidities is felt to place them at high risk for further clinical deterioration. Furthermore, it is not anticipated that the patient will be medically stable  for discharge from the hospital within 2 midnights of admission.   * I certify that at the point of admission it is my clinical judgment that the patient will require inpatient hospital care spanning beyond 2 midnights from the point of admission due to high intensity of service, high risk for further deterioration and high frequency of surveillance required.*       Date of Service 09/10/2023    Caleb Exon [facility]   If 7PM-7AM, please contact night-coverage [email or web address] 09/10/2023, 11:10 PM

## 2023-09-11 DIAGNOSIS — N3000 Acute cystitis without hematuria: Secondary | ICD-10-CM

## 2023-09-11 DIAGNOSIS — E782 Mixed hyperlipidemia: Secondary | ICD-10-CM

## 2023-09-11 DIAGNOSIS — Z720 Tobacco use: Secondary | ICD-10-CM | POA: Diagnosis not present

## 2023-09-11 DIAGNOSIS — E43 Unspecified severe protein-calorie malnutrition: Secondary | ICD-10-CM | POA: Diagnosis not present

## 2023-09-11 DIAGNOSIS — J9621 Acute and chronic respiratory failure with hypoxia: Secondary | ICD-10-CM | POA: Diagnosis not present

## 2023-09-11 DIAGNOSIS — J441 Chronic obstructive pulmonary disease with (acute) exacerbation: Secondary | ICD-10-CM | POA: Diagnosis not present

## 2023-09-11 LAB — RESPIRATORY PANEL BY PCR

## 2023-09-11 LAB — BASIC METABOLIC PANEL
Anion gap: 9 (ref 5–15)
BUN: 14 mg/dL (ref 8–23)
CO2: 26 mmol/L (ref 22–32)
Calcium: 8.3 mg/dL — ABNORMAL LOW (ref 8.9–10.3)
Chloride: 105 mmol/L (ref 98–111)
Creatinine, Ser: 0.64 mg/dL (ref 0.61–1.24)
GFR, Estimated: >60 mL/min (ref >60)
Glucose, Bld: 170 mg/dL — ABNORMAL HIGH (ref 70–99)
Potassium: 3.9 mmol/L (ref 3.5–5.1)
Sodium: 140 mmol/L (ref 135–145)

## 2023-09-11 LAB — HIV ANTIBODY (ROUTINE TESTING W REFLEX): HIV Screen 4th Generation wRfx: NONREACTIVE

## 2023-09-11 LAB — EXPECTORATED SPUTUM ASSESSMENT W GRAM STAIN, RFLX TO RESP C

## 2023-09-11 LAB — CBC
HCT: 36.1 % — ABNORMAL LOW (ref 39.0–52.0)
Hemoglobin: 11.9 g/dL — ABNORMAL LOW (ref 13.0–17.0)
MCH: 31.2 pg (ref 26.0–34.0)
MCHC: 33 g/dL (ref 30.0–36.0)
MCV: 94.5 fL (ref 80.0–100.0)
Platelets: 192 10*3/uL (ref 150–400)
RBC: 3.82 MIL/uL — ABNORMAL LOW (ref 4.22–5.81)
RDW: 14.5 % (ref 11.5–15.5)
WBC: 7.6 10*3/uL (ref 4.0–10.5)
nRBC: 0 % (ref 0.0–0.2)

## 2023-09-11 LAB — SARS CORONAVIRUS 2 BY RT PCR: SARS Coronavirus 2 by RT PCR: NEGATIVE

## 2023-09-11 MED ORDER — APIXABAN 5 MG PO TABS
5.0000 mg | ORAL_TABLET | Freq: Two times a day (BID) | ORAL | Status: DC
  Administered 2023-09-11 – 2023-09-12 (×4): 5 mg via ORAL
  Filled 2023-09-11 (×4): qty 1

## 2023-09-11 MED ORDER — METHYLPREDNISOLONE SODIUM SUCC 40 MG IJ SOLR
40.0000 mg | Freq: Two times a day (BID) | INTRAMUSCULAR | Status: DC
  Administered 2023-09-11 – 2023-09-13 (×4): 40 mg via INTRAVENOUS
  Filled 2023-09-11 (×5): qty 1

## 2023-09-11 MED ORDER — HYDROCOD POLI-CHLORPHE POLI ER 10-8 MG/5ML PO SUER
5.0000 mL | Freq: Once | ORAL | Status: AC
  Administered 2023-09-11: 5 mL via ORAL
  Filled 2023-09-11: qty 5

## 2023-09-11 NOTE — Progress Notes (Signed)
 Progress Note   [name] FMW:[id number] DOB: 07-17-1961 DOA: 09/10/2023     1 DOS: the patient was seen and examined on 09/11/2023   Brief hospital course:  Benjamin Bolton is a 63 y.o. male with medical history significant of COPD on 3L O2, HLD, pre-DM, DVT on Eliquis , who presents with SOB.  Patient is admitted to the hospitalist service for further management evaluation of COPD exacerbation.  Assessment and Plan: Acute on chronic respiratory failure with hypoxia due to COPD exacerbation: No pneumonia by chest x-ray. Continue bronchodilators Patient got Solu-Medrol  125 mg, continue 40 mg bid Continue IV rocephin  which is for UTI Advised Mucinex  for cough  Encouraged incentive spirometry Follow sputum culture Continue supplemental oxygen to maintain O2 saturation 93% or greater   Hx of DVT (deep venous thrombosis) (HCC): Patient will be continued on Eliquis  therapy   HLD (hyperlipidemia) Continue Crestor   UTI (urinary tract infection) IV rocephin  Will f/u Urine culture   Tobacco abuse:  Encouraged to quit smoking. Advised nicotine  patches.   Protein-calorie malnutrition, severe (HCC):  BMI 18.17 Encourage diet, supplements like Ensure Nutrition consult       Out of bed to chair. Incentive spirometry. Nursing supportive care. Fall, aspiration precautions. DVT prophylaxis   Code Status: Full Code  Subjective: Patient is seen and examined today morning.  He is in mild respiratory distress, states he is unable to walk short distances.  Not eating well.  Has dry cough.  Physical Exam: Vitals:   09/11/23 0900 09/11/23 1300 09/11/23 1425 09/11/23 1500  BP: 112/69 113/81 117/79 112/75  Pulse: (!) 101 99 99 (!) 103  Resp: (!) 21 (!) 23 (!) 24 20  Temp:      TempSrc:      SpO2: 97% (!) 88% 98% 95%  Weight:      Height:        General -  Middle aged ill looking African-American male, mild respiratory distress HEENT - PERRLA, EOMI, atraumatic head, non  tender sinuses. Lung - Clear, diffuse wheezes, crackles. Heart - S1, S2 heard, no murmurs, rubs, trace pedal edema. Abdomen - Soft, non tender, nondistended, bowel sounds good Neuro - Alert, awake and oriented x 3, non focal exam. Skin - Warm and dry.  Data Reviewed:      Latest Ref Rng & Units 09/11/2023    5:30 AM 09/10/2023    1:12 PM 02/26/2023    2:59 PM  CBC  WBC 4.0 - 10.5 K/uL 7.6  14.2  8.0   Hemoglobin 13.0 - 17.0 g/dL 88.0  84.7  86.8   Hematocrit 39.0 - 52.0 % 36.1  46.8  41.1   Platelets 150 - 400 K/uL 192  242  209       Latest Ref Rng & Units 09/11/2023    5:30 AM 09/10/2023    1:12 PM 02/26/2023    2:59 PM  BMP  Glucose 70 - 99 mg/dL 829  837  868   BUN 8 - 23 mg/dL 14  12  22    Creatinine 0.61 - 1.24 mg/dL 9.35  9.30  8.84   Sodium 135 - 145 mmol/L 140  138  140   Potassium 3.5 - 5.1 mmol/L 3.9  3.7  4.4   Chloride 98 - 111 mmol/L 105  101  106   CO2 22 - 32 mmol/L 26  24  28    Calcium 8.9 - 10.3 mg/dL 8.3  9.0  8.9    DG Chest 2 View  Result Date: 09/10/2023 CLINICAL DATA:  Suspected sepsis. EXAM: CHEST - 2 VIEW COMPARISON:  Chest radiograph dated 02/26/2023. FINDINGS: Background of emphysema. No focal consolidation, pleural effusion, or pneumothorax. The cardiac silhouette is within normal limits. No acute osseous pathology. IMPRESSION: 1. No active cardiopulmonary disease. 2. Emphysema. Electronically Signed   By: Vanetta Chou M.D.   On: 09/10/2023 14:19     Family Communication: Discussed with patient he understand and agree. All questions answereed.    Disposition: Status is: Inpatient Remains inpatient appropriate because: IV steroids, IV antibiotics for COPD  Planned Discharge Destination:  shelter     Time spent:, 39 minutes  Author: Concepcion Riser, MD 09/11/2023 4:56 PM Secure chat 7am to 7pm For on call review [email or web address].

## 2023-09-11 NOTE — ED Notes (Signed)
Pt resting in bed, denies any needs at this time.

## 2023-09-11 NOTE — ED Notes (Signed)
 Pt called this RN into room stating he is out of energy after his last breathing treatment, requesting a steroid shot. This RN asked provider if he could get something for anxiety as needed as well. See orders.

## 2023-09-12 DIAGNOSIS — J9621 Acute and chronic respiratory failure with hypoxia: Secondary | ICD-10-CM | POA: Diagnosis not present

## 2023-09-12 DIAGNOSIS — E43 Unspecified severe protein-calorie malnutrition: Secondary | ICD-10-CM | POA: Diagnosis not present

## 2023-09-12 DIAGNOSIS — Z720 Tobacco use: Secondary | ICD-10-CM | POA: Diagnosis not present

## 2023-09-12 DIAGNOSIS — J121 Respiratory syncytial virus pneumonia: Secondary | ICD-10-CM

## 2023-09-12 DIAGNOSIS — J441 Chronic obstructive pulmonary disease with (acute) exacerbation: Secondary | ICD-10-CM | POA: Diagnosis not present

## 2023-09-12 LAB — URINE CULTURE

## 2023-09-12 MED ORDER — HYDROCOD POLI-CHLORPHE POLI ER 10-8 MG/5ML PO SUER
5.0000 mL | Freq: Once | ORAL | Status: AC
  Administered 2023-09-12: 5 mL via ORAL
  Filled 2023-09-12: qty 5

## 2023-09-12 MED ORDER — REVEFENACIN 175 MCG/3ML IN SOLN
175.0000 ug | Freq: Every day | RESPIRATORY_TRACT | Status: DC
  Administered 2023-09-12 – 2023-09-13 (×2): 175 ug via RESPIRATORY_TRACT
  Filled 2023-09-12 (×2): qty 3

## 2023-09-12 MED ORDER — IPRATROPIUM-ALBUTEROL 0.5-2.5 (3) MG/3ML IN SOLN
3.0000 mL | Freq: Four times a day (QID) | RESPIRATORY_TRACT | Status: DC | PRN
  Administered 2023-09-13: 3 mL via RESPIRATORY_TRACT
  Filled 2023-09-12: qty 3

## 2023-09-12 NOTE — Progress Notes (Signed)
 Progress Note   [name] FMW:[id number] DOB: 1961-07-14 DOA: 09/10/2023     2 DOS: the patient was seen and examined on 09/12/2023   Brief hospital course:  Benjamin Bolton is a 63 y.o. male with medical history significant of COPD on 3L O2, HLD, pre-DM, DVT on Eliquis , who presents with SOB.  Patient is admitted to the hospitalist service for further management evaluation of COPD exacerbation.  Assessment and Plan: Acute on chronic respiratory failure with hypoxia due to COPD exacerbation:  RSV positive  No pneumonia by chest x-ray. Patient wishes to talk to Pulmonologist as he is not improving.  Asks for CPAP machine as he has trouble catching breath.  Pulmonary consulted. Continue bronchodilators Continue Solu-Medrol  40 mg bid Continue IV rocephin  which is for UTI Continue Mucinex  for cough  Encouraged incentive spirometry. Continue droplet precaution for RSV, supportive care. He is currently on 3 L supplemental oxygen to maintain O2 saturation 93% which is his baseline home O2.   Hx of DVT (deep venous thrombosis) (HCC): Continue Eliquis  therapy   HLD (hyperlipidemia) Continue Crestor   UTI (urinary tract infection) Finish 3 days of IV rocephin  Urine culture grew multiple species.   Tobacco abuse:  Encouraged to quit smoking. Ordered nicotine  patch.   Protein-calorie malnutrition, severe (HCC):  BMI 18.17 Encourage diet, supplements like Ensure He wants to go to shelter after discharge, as he does not feel safe at current living situation. TOC consulted.    Out of bed to chair. Incentive spirometry. Nursing supportive care. Fall, aspiration precautions. DVT prophylaxis   Code Status: Full Code  Subjective: Patient is seen and examined today morning.  He feels like unable to catch his breath. Has dry cough. Unable to ambulate without dyspnea.  Physical Exam: Vitals:   09/12/23 1200 09/12/23 1255 09/12/23 1256 09/12/23 1553  BP: 114/82 122/83  128/84   Pulse: (!) 102 95  (!) 107  Resp: (!) 21 (!) 24  18  Temp:   98.6 F (37 C) 98.6 F (37 C)  TempSrc:   Oral Oral  SpO2: 100% 95%  100%  Weight:      Height:        General -  Middle aged ill looking African-American male, moderate respiratory distress HEENT - PERRLA, EOMI, atraumatic head, non tender sinuses. Lung - decreased breath sounds, diffuse wheezes, crackles. Heart - S1, S2 heard, no murmurs, rubs, trace pedal edema. Abdomen - Soft, non tender, nondistended, bowel sounds good Neuro - Alert, awake and oriented x 3, non focal exam. Skin - Warm and dry.  Data Reviewed:      Latest Ref Rng & Units 09/11/2023    5:30 AM 09/10/2023    1:12 PM 02/26/2023    2:59 PM  CBC  WBC 4.0 - 10.5 K/uL 7.6  14.2  8.0   Hemoglobin 13.0 - 17.0 g/dL 88.0  84.7  86.8   Hematocrit 39.0 - 52.0 % 36.1  46.8  41.1   Platelets 150 - 400 K/uL 192  242  209       Latest Ref Rng & Units 09/11/2023    5:30 AM 09/10/2023    1:12 PM 02/26/2023    2:59 PM  BMP  Glucose 70 - 99 mg/dL 829  837  868   BUN 8 - 23 mg/dL 14  12  22    Creatinine 0.61 - 1.24 mg/dL 9.35  9.30  8.84   Sodium 135 - 145 mmol/L 140  138  140  Potassium 3.5 - 5.1 mmol/L 3.9  3.7  4.4   Chloride 98 - 111 mmol/L 105  101  106   CO2 22 - 32 mmol/L 26  24  28    Calcium 8.9 - 10.3 mg/dL 8.3  9.0  8.9    No results found.  Family Communication: Discussed with patient he understand and agree. All questions answereed.  Disposition: Status is: Inpatient Remains inpatient appropriate because: IV steroids, IV antibiotics for COPD  Planned Discharge Destination:  shelter     Time spent:, 39 minutes  Author: Concepcion Riser, MD 09/12/2023 4:56 PM Secure chat 7am to 7pm For on call review [email or web address].

## 2023-09-12 NOTE — ED Notes (Signed)
 Presenter, broadcasting confirms with CCMD pt on monitor per order

## 2023-09-12 NOTE — Consult Note (Signed)
 PULMONOLOGY         Date: 09/12/2023,   MRN# [id number] Benjamin Bolton 10/12/1960     AdmissionWeight: 60.8 kg                 CurrentWeight: 60.8 kg  Referring provider: Dr Darci   CHIEF COMPLAINT:   Severe COPD with acute exacerbation   HISTORY OF PRESENT ILLNESS   This is a 63 year old male with a history of DVTs, dyslipidemia, recurrent migraines, diabetes, rotator cuff tear lifelong smoking history with severe COPD and chronic hypoxemia requiring 3 L of oxygen at the baseline per nasal cannula.  He takes anticoagulation with Eliquis  for his DVT.  He complained of few days of worsening dyspnea with heavy mucus production and inspissated phlegm that is difficult to expectorate.  He denied chest pain or presyncope, denied nausea vomiting diarrhea, he has previous history of recurrent pneumonia and recurrent COPD exacerbations.  He was found to be acute on chronic hypoxemic requiring 6 L of oxygen to reach normoxia.  While in the ED he had blood work done with findings of leukocytosis, also complained of dysuria and had UA performed with findings of bacteria in the urine with leukocyte esterase and hazy appearance of the urine.  He had mild lactic acidosis in the ED after receiving nebulizer therapy.  He had an x-ray performed which was negative for consolidated infiltrate or findings to suggest pulmonary edema.  He had EKG performed without worrisome findings.  He received treatment with IV steroids as well as Mucinex  and empiric antibiotics for possible community-acquired pneumonia versus UTI.  PCCM consultation was placed due to recurrent exacerbations of COPD with pneumonia and recurrent admissions with a history of severe COPD and current acute exacerbation.   PAST MEDICAL HISTORY   Past Medical History:  Diagnosis Date   COPD (chronic obstructive pulmonary disease) (HCC)    DVT (deep venous thrombosis) (HCC)    Dyspnea    GERD (gastroesophageal reflux disease)     no meds   HLD (hyperlipidemia)    Hx of migraines    Oxygen dependent    3 L [place]   Pneumonia 2023   Pre-diabetes    Pulmonary nodule    Rotator cuff tear, right    Tobacco abuse    Tobacco use      SURGICAL HISTORY   Past Surgical History:  Procedure Laterality Date   CHEST TUBE INSERTION     SHOULDER ARTHROSCOPY WITH SUBACROMIAL DECOMPRESSION, ROTATOR CUFF REPAIR AND BICEP TENDON REPAIR Right 02/18/2023   Procedure: RIGHT SHOULDER ARTHROSCOPY WITH DEBRIDEMENT, DECOMPRESSION, ROTATOR CUFF REPAIR, BICEPS TENODESIS.;  Surgeon: Edie Norleen PARAS, MD;  Location: [facility] ORS;  Service: Orthopedics;  Laterality: Right;     FAMILY HISTORY   No family history on file.   SOCIAL HISTORY   Social History   Tobacco Use   Smoking status: Some Days    Current packs/day: 0.25    Average packs/day: 0.3 packs/day for 48.0 years (12.0 ttl pk-yrs)    Types: Cigarettes   Smokeless tobacco: Never  Vaping Use   Vaping status: Never Used  Substance Use Topics   Alcohol  use: Yes    Comment: occ   Drug use: Never     MEDICATIONS    Home Medication:  Current Outpatient Rx   Order #: [id number] Class: Historical Med   Order #: [id number] Class: Historical Med   Order #: [id number] Class: Normal   Order #: [id number] Class: Normal   Order #: [id number] Class: Historical  Med   Order #: [id number] Class: Historical Med   Order #: [id number] Class: Historical Med   Order #: [id number] Class: Historical Med   Order #: [id number] Class: Historical Med   Order #: [id number] Class: Historical Med   Order #: [id number] Class: Historical Med   Order #: [id number] Class: Historical Med   Order #: [id number] Class: Historical Med   Order #: [id number] Class: Normal    Current Medication:  Current Facility-Administered Medications:    acetaminophen  (TYLENOL ) tablet 650 mg, 650 mg, Oral, Q6H PRN, Niu, Xilin, MD, 650 mg at 09/12/23 0024   albuterol  (PROVENTIL ) (2.5 MG/3ML) 0.083% nebulizer solution 2.5 mg, 2.5 mg,  Nebulization, Q4H PRN, Niu, Xilin, MD   apixaban  (ELIQUIS ) tablet 5 mg, 5 mg, Oral, BID, Niu, Xilin, MD, 5 mg at 09/12/23 9068   cefTRIAXone  (ROCEPHIN ) 1 g in sodium chloride  0.9 % 100 mL IVPB, 1 g, Intravenous, Q24H, Niu, Xilin, MD, Stopped at 09/11/23 2310   dextromethorphan -guaiFENesin  (MUCINEX  DM) 30-600 MG per 12 hr tablet 1 tablet, 1 tablet, Oral, BID PRN, Niu, Xilin, MD, 1 tablet at 09/12/23 1052   feeding supplement (ENSURE ENLIVE / ENSURE PLUS) liquid 237 mL, 237 mL, Oral, BID BM, Niu, Xilin, MD, 237 mL at 09/12/23 1046   hydrALAZINE  (APRESOLINE ) injection 5 mg, 5 mg, Intravenous, Q2H PRN, Niu, Xilin, MD   ipratropium-albuterol  (DUONEB) 0.5-2.5 (3) MG/3ML nebulizer solution 3 mL, 3 mL, Nebulization, Q4H, Niu, Xilin, MD, 3 mL at 09/12/23 1120   methylPREDNISolone  sodium succinate  (SOLU-MEDROL ) 40 mg/mL injection 40 mg, 40 mg, Intravenous, Q12H, Sreeram, Narendranath, MD, 40 mg at 09/12/23 0636   nicotine  (NICODERM CQ  - dosed in mg/24 hours) patch 21 mg, 21 mg, Transdermal, Daily, Niu, Xilin, MD, 21 mg at 09/12/23 9066   ondansetron  (ZOFRAN ) injection 4 mg, 4 mg, Intravenous, Q8H PRN, Niu, Xilin, MD   oxyCODONE  (Oxy IR/ROXICODONE ) immediate release tablet 5 mg, 5 mg, Oral, Q4H PRN, Niu, Xilin, MD, 5 mg at 09/12/23 0423  Current Outpatient Medications:    albuterol  (PROVENTIL ) (2.5 MG/3ML) 0.083% nebulizer solution, USE 1 VIAL IN NEBULIZER EVERY 6 HOURS AS NEEDED FOR WHEEZING, Disp: , Rfl:    albuterol  (VENTOLIN  HFA) 108 (90 Base) MCG/ACT inhaler, Inhale 1 puff into the lungs every 4 (four) hours as needed for wheezing or shortness of breath., Disp: , Rfl:    APIXABAN  (ELIQUIS ) VTE STARTER PACK (10MG  AND 5MG ), Take as directed on package: start with two-5mg  tablets twice daily for 7 days. On day 8, switch to one-5mg  tablet twice daily., Disp: 74 each, Rfl: 0   ondansetron  (ZOFRAN ) 4 MG tablet, Take 1 tablet (4 mg total) by mouth every 6 (six) hours as needed for nausea., Disp: 30 tablet, Rfl:  0   oxyCODONE  (OXY IR/ROXICODONE ) 5 MG immediate release tablet, Take 1 tablet by mouth 2 (two) times daily as needed., Disp: , Rfl:    OXYGEN, Inhale 3 L into the lungs continuous., Disp: , Rfl:    predniSONE  (DELTASONE ) 10 MG tablet, Take 2-6 tablets by mouth daily with breakfast.  ORIGINAL DPH:ujxz 6 tabs daily x4 days; then 4 tabs daily x4 days; then 2 tabs daily x4 days., Disp: , Rfl:    predniSONE  (DELTASONE ) 20 MG tablet, Take 20 mg by mouth daily with breakfast., Disp: , Rfl:    TRELEGY ELLIPTA 100-62.5-25 MCG/ACT AEPB, Inhale 1 puff into the lungs every morning., Disp: , Rfl:    amoxicillin-clavulanate (AUGMENTIN) 875-125 MG tablet, Take 1 tablet by mouth every 12 (twelve) hours. (Patient not taking: Reported on 09/10/2023), Disp: ,  Rfl:    benralizumab (FASENRA PEN) 30 MG/ML prefilled autoinjector, Inject 30 mg into the skin as directed. (Patient not taking: Reported on 09/10/2023), Disp: , Rfl:    nystatin (MYCOSTATIN) 100000 UNIT/ML suspension, Take 5 mLs by mouth 4 (four) times daily. (Patient not taking: Reported on 09/10/2023), Disp: , Rfl:    omeprazole  (PRILOSEC ) 20 MG capsule, Take 20 mg by mouth daily. (Patient not taking: Reported on 09/10/2023), Disp: , Rfl:    oxyCODONE  (OXY IR/ROXICODONE ) 5 MG immediate release tablet, Take 1-2 tablets (5-10 mg total) by mouth every 4 (four) hours as needed for severe pain., Disp: 40 tablet, Rfl: 0    ALLERGIES   Patient has no active allergies.     REVIEW OF SYSTEMS    Review of Systems:  Gen:  Denies  fever, sweats, chills weigh loss  HEENT: Denies blurred vision, double vision, ear pain, eye pain, hearing loss, nose bleeds, sore throat Cardiac:  No dizziness, chest pain or heaviness, chest tightness,edema Resp:   reports dyspnea chronically  Gi: Denies swallowing difficulty, stomach pain, nausea or vomiting, diarrhea, constipation, bowel incontinence Gu:  Denies bladder incontinence, burning urine Ext:   Denies Joint pain, stiffness  or swelling Skin: Denies  skin rash, easy bruising or bleeding or hives Endoc:  Denies polyuria, polydipsia , polyphagia or weight change Psych:   Denies depression, insomnia or hallucinations   Other:  All other systems negative   VS: BP 122/83   Pulse 95   Temp 98.6 F (37 C) (Oral)   Resp (!) 24   Ht 6' (1.829 m)   Wt 60.8 kg   SpO2 95%   BMI 18.17 kg/m      PHYSICAL EXAM    GENERAL:NAD, no fevers, chills, no weakness no fatigue HEAD: Normocephalic, atraumatic.  EYES: Pupils equal, round, reactive to light. Extraocular muscles intact. No scleral icterus.  MOUTH: Moist mucosal membrane. Dentition intact. No abscess noted.  EAR, NOSE, THROAT: Clear without exudates. No external lesions.  NECK: Supple. No thyromegaly. No nodules. No JVD.  PULMONARY: decreased breath sounds with mild rhonchi worse at bases bilaterally.  CARDIOVASCULAR: S1 and S2. Regular rate and rhythm. No murmurs, rubs, or gallops. No edema. Pedal pulses 2+ bilaterally.  GASTROINTESTINAL: Soft, nontender, nondistended. No masses. Positive bowel sounds. No hepatosplenomegaly.  MUSCULOSKELETAL: No swelling, clubbing, or edema. Range of motion full in all extremities.  NEUROLOGIC: Cranial nerves II through XII are intact. No gross focal neurological deficits. Sensation intact. Reflexes intact.  SKIN: No ulceration, lesions, rashes, or cyanosis. Skin warm and dry. Turgor intact.  PSYCHIATRIC: Mood, affect within normal limits. The patient is awake, alert and oriented x 3. Insight, judgment intact.       IMAGING     Narrative & Impression  CLINICAL DATA:  Follow-up of pulmonary nodule.  COPD.   EXAM: CT CHEST WITHOUT CONTRAST   TECHNIQUE: Multidetector CT imaging of the chest was performed following the standard protocol without IV contrast.   RADIATION DOSE REDUCTION: This exam was performed according to the departmental dose-optimization program which includes automated exposure control,  adjustment of the mA and/or kV according to patient size and/or use of iterative reconstruction technique.   COMPARISON:  02/26/2023.  PET of 01/03/2022.   FINDINGS: Cardiovascular: Aortic atherosclerosis. Tortuous thoracic aorta. Normal heart size, without pericardial effusion. Three vessel coronary artery calcification.   Mediastinum/Nodes: No mediastinal or hilar adenopathy, given limitations of unenhanced CT.   Lungs/Pleura: No pleural fluid. Right greater than left pleuroparenchymal scarring  at the apices. The most nodular component measures 8 mm on 25/3 and is similar to on the prior. Right lower lobe bandlike opacity including on 145/3 is increased. Left base scarring laterally is unchanged.   Upper Abdomen: Normal imaged portions of the liver, spleen, stomach, pancreas, gallbladder, adrenal glands, kidneys.   Musculoskeletal: No acute osseous abnormality.   IMPRESSION: 1. Biapical pleuroparenchymal scarring. The area of nodularity in the right apex is unchanged and was negative on 01/03/2022 PET. Therefore, this can be presumed benign and specific imaging surveillance is not indicated. 2. Since 02/26/2023, increase in medial right base bandlike opacity, favored to represent progressive scarring or atelectasis superimposed upon scarring. If the patient has acute/subacute infectious symptoms, superimposed infection is a possibility. 3. Incidental findings, including: Aortic atherosclerosis (ICD10-I70.0), coronary artery atherosclerosis and emphysema (ICD10-J43.9).     Electronically Signed   By: Rockey Kilts M.D.   On: 04/25/2023 09:36      ASSESSMENT/PLAN   Acute on chronic hypoxemic respiratory failure Due to RSV virus infection     - agree with current COPD care path     -Revefenacin  once daily 175cmg     - duoneb q6h prn     - incentive spirometry and flutter valve for chest physiotherapy    - rocephin  for UTI and CAP    - steroids with solumedrol 40  bid    Mucinex  DM      S/p tussinex    - CRP trending         Chornic DVT   Continue eliquis      Tobacco smoking - nicotine  replacement with nicoderm      Thank you for allowing me to participate in the care of this patient.   Patient/Family are satisfied with care plan and all questions have been answered.    Provider disclosure: Patient with at least one acute or chronic illness or injury that poses a threat to life or bodily function and is being managed actively during this encounter.  All of the below services have been performed independently by signing provider:  review of prior documentation from internal and or external health records.  Review of previous and current lab results.  Interview and comprehensive assessment during patient visit today. Review of current and previous chest radiographs/CT scans. Discussion of management and test interpretation with health care team and patient/family.   This document was prepared using Dragon voice recognition software and may include unintentional dictation errors.     Brent Noto, M.D.  Division of Pulmonary & Critical Care Medicine

## 2023-09-12 NOTE — ED Notes (Signed)
 Dressing changed on 20g RFA piv site is now cdi patent secured not infiltrated @ this time

## 2023-09-12 NOTE — ED Notes (Signed)
 Patient denies pain and is resting comfortably.

## 2023-09-12 NOTE — ED Notes (Signed)
 Pt remains on 3L O2 via [place] pt remains aox4 denies further needs uses urinal throughout the shift without apparent difficulty

## 2023-09-13 ENCOUNTER — Other Ambulatory Visit: Payer: Self-pay

## 2023-09-13 ENCOUNTER — Inpatient Hospital Stay: Payer: 59

## 2023-09-13 DIAGNOSIS — I824Y9 Acute embolism and thrombosis of unspecified deep veins of unspecified proximal lower extremity: Secondary | ICD-10-CM | POA: Diagnosis not present

## 2023-09-13 DIAGNOSIS — E782 Mixed hyperlipidemia: Secondary | ICD-10-CM | POA: Diagnosis not present

## 2023-09-13 DIAGNOSIS — J9622 Acute and chronic respiratory failure with hypercapnia: Secondary | ICD-10-CM

## 2023-09-13 DIAGNOSIS — J9621 Acute and chronic respiratory failure with hypoxia: Secondary | ICD-10-CM | POA: Diagnosis not present

## 2023-09-13 DIAGNOSIS — E43 Unspecified severe protein-calorie malnutrition: Secondary | ICD-10-CM | POA: Diagnosis not present

## 2023-09-13 DIAGNOSIS — J441 Chronic obstructive pulmonary disease with (acute) exacerbation: Secondary | ICD-10-CM | POA: Diagnosis not present

## 2023-09-13 DIAGNOSIS — R000 Tachycardia, unspecified: Secondary | ICD-10-CM | POA: Diagnosis not present

## 2023-09-13 LAB — BLOOD GAS, VENOUS
Acid-Base Excess: 1.4 mmol/L (ref 0.0–2.0)
Acid-Base Excess: 5.7 mmol/L — ABNORMAL HIGH (ref 0.0–2.0)
Bicarbonate: 32.5 mmol/L — ABNORMAL HIGH (ref 20.0–28.0)
Bicarbonate: 32.1 mmol/L — ABNORMAL HIGH (ref 20.0–28.0)
O2 Saturation: 92.4 %
O2 Saturation: 88.1 %
Patient temperature: 37
Patient temperature: 37
pCO2, Ven: 87 mm[Hg] (ref 44–60)
pCO2, Ven: 53 mm[Hg] (ref 44–60)
pH, Ven: 7.18 — CL (ref 7.25–7.43)
pH, Ven: 7.39 (ref 7.25–7.43)
pO2, Ven: 67 mm[Hg] — ABNORMAL HIGH (ref 32–45)
pO2, Ven: 56 mm[Hg] — ABNORMAL HIGH (ref 32–45)

## 2023-09-13 LAB — COMPREHENSIVE METABOLIC PANEL
ALT: 24 U/L (ref 0–44)
AST: 31 U/L (ref 15–41)
Albumin: 3.4 g/dL — ABNORMAL LOW (ref 3.5–5.0)
Alkaline Phosphatase: 76 U/L (ref 38–126)
Anion gap: 13 (ref 5–15)
BUN: 22 mg/dL (ref 8–23)
CO2: 23 mmol/L (ref 22–32)
Calcium: 8.5 mg/dL — ABNORMAL LOW (ref 8.9–10.3)
Chloride: 98 mmol/L (ref 98–111)
Creatinine, Ser: 0.96 mg/dL (ref 0.61–1.24)
GFR, Estimated: >60 mL/min (ref >60)
Glucose, Bld: 196 mg/dL — ABNORMAL HIGH (ref 70–99)
Potassium: 4.5 mmol/L (ref 3.5–5.1)
Sodium: 134 mmol/L — ABNORMAL LOW (ref 135–145)
Total Bilirubin: 0.6 mg/dL (ref 0.0–1.2)
Total Protein: 6.2 g/dL — ABNORMAL LOW (ref 6.5–8.1)

## 2023-09-13 LAB — CBC
HCT: 41.3 % (ref 39.0–52.0)
Hemoglobin: 13.3 g/dL (ref 13.0–17.0)
MCH: 31.4 pg (ref 26.0–34.0)
MCHC: 32.2 g/dL (ref 30.0–36.0)
MCV: 97.4 fL (ref 80.0–100.0)
Platelets: 247 10*3/uL (ref 150–400)
RBC: 4.24 MIL/uL (ref 4.22–5.81)
RDW: 14.8 % (ref 11.5–15.5)
WBC: 17.8 10*3/uL — ABNORMAL HIGH (ref 4.0–10.5)
nRBC: 0.1 % (ref 0.0–0.2)

## 2023-09-13 LAB — BLOOD GAS, ARTERIAL
Acid-Base Excess: 2 mmol/L (ref 0.0–2.0)
Acid-Base Excess: 2.8 mmol/L — ABNORMAL HIGH (ref 0.0–2.0)
Bicarbonate: 32.8 mmol/L — ABNORMAL HIGH (ref 20.0–28.0)
Bicarbonate: 28.5 mmol/L — ABNORMAL HIGH (ref 20.0–28.0)
Delivery systems: POSITIVE
Expiratory PAP: 5 cm[H2O]
FIO2: 21 %
FIO2: 40 %
Inspiratory PAP: 16 cm[H2O]
MECHVT: 500 mL
Mechanical Rate: 25
O2 Saturation: 43.5 %
O2 Saturation: 99.9 %
PEEP: 5 cmH2O
Patient temperature: 37
Patient temperature: 37
pCO2 arterial: 84 mm[Hg] (ref 32–48)
pCO2 arterial: 47 mm[Hg] (ref 32–48)
pH, Arterial: 7.2 — ABNORMAL LOW (ref 7.35–7.45)
pH, Arterial: 7.39 (ref 7.35–7.45)
pO2, Arterial: 32 mm[Hg] — CL (ref 83–108)
pO2, Arterial: 126 mm[Hg] — ABNORMAL HIGH (ref 83–108)

## 2023-09-13 LAB — GLUCOSE, CAPILLARY
Glucose-Capillary: 137 mg/dL — ABNORMAL HIGH (ref 70–99)
Glucose-Capillary: 255 mg/dL — ABNORMAL HIGH (ref 70–99)
Glucose-Capillary: 263 mg/dL — ABNORMAL HIGH (ref 70–99)
Glucose-Capillary: 192 mg/dL — ABNORMAL HIGH (ref 70–99)
Glucose-Capillary: 175 mg/dL — ABNORMAL HIGH (ref 70–99)
Glucose-Capillary: 117 mg/dL — ABNORMAL HIGH (ref 70–99)

## 2023-09-13 LAB — APTT
aPTT: 25 s (ref 24–36)
aPTT: 48 s — ABNORMAL HIGH (ref 24–36)

## 2023-09-13 LAB — PROTIME-INR
INR: 1.1 (ref 0.8–1.2)
Prothrombin Time: 14 s (ref 11.4–15.2)

## 2023-09-13 LAB — MRSA NEXT GEN BY PCR, NASAL: MRSA by PCR Next Gen: NOT DETECTED

## 2023-09-13 LAB — TROPONIN I (HIGH SENSITIVITY): Troponin I (High Sensitivity): 115 ng/L (ref <18)

## 2023-09-13 LAB — C-REACTIVE PROTEIN: CRP: 0.6 mg/dL (ref <1.0)

## 2023-09-13 LAB — MAGNESIUM: Magnesium: 2.2 mg/dL (ref 1.7–2.4)

## 2023-09-13 LAB — PHOSPHORUS: Phosphorus: 2.5 mg/dL (ref 2.5–4.6)

## 2023-09-13 MED ORDER — PROPOFOL 1000 MG/100ML IV EMUL
0.0000 ug/kg/min | INTRAVENOUS | Status: DC
  Administered 2023-09-14: 30 ug/kg/min via INTRAVENOUS
  Filled 2023-09-13 (×2): qty 100

## 2023-09-13 MED ORDER — PROSOURCE TF20 ENFIT COMPATIBL EN LIQD
60.0000 mL | Freq: Every day | ENTERAL | Status: DC
  Administered 2023-09-13 – 2023-09-17 (×4): 60 mL
  Filled 2023-09-13: qty 60

## 2023-09-13 MED ORDER — ADULT MULTIVITAMIN W/MINERALS CH
1.0000 | ORAL_TABLET | Freq: Every day | ORAL | Status: DC
  Administered 2023-09-13: 1 via ORAL
  Filled 2023-09-13: qty 1

## 2023-09-13 MED ORDER — DOCUSATE SODIUM 50 MG/5ML PO LIQD
100.0000 mg | Freq: Two times a day (BID) | ORAL | Status: DC
  Administered 2023-09-13 – 2023-09-28 (×28): 100 mg
  Filled 2023-09-13 (×29): qty 10

## 2023-09-13 MED ORDER — MORPHINE SULFATE (PF) 2 MG/ML IV SOLN
2.0000 mg | Freq: Once | INTRAVENOUS | Status: AC
  Administered 2023-09-13: 2 mg via INTRAVENOUS
  Filled 2023-09-13: qty 1

## 2023-09-13 MED ORDER — SODIUM CHLORIDE 0.9 % IV SOLN
250.0000 mL | INTRAVENOUS | Status: AC
Start: 2023-09-13 — End: 2023-09-14
  Administered 2023-09-13: 250 mL via INTRAVENOUS

## 2023-09-13 MED ORDER — NOREPINEPHRINE 4 MG/250ML-% IV SOLN
INTRAVENOUS | Status: AC
  Filled 2023-09-13: qty 250

## 2023-09-13 MED ORDER — FREE WATER
30.0000 mL | Status: DC
Start: 2023-09-13 — End: 2023-09-29
  Administered 2023-09-13 – 2023-09-29 (×83): 30 mL

## 2023-09-13 MED ORDER — VITAL 1.5 CAL PO LIQD
1000.0000 mL | ORAL | Status: DC
  Administered 2023-09-13: 1000 mL

## 2023-09-13 MED ORDER — DEXMEDETOMIDINE HCL IN NACL 400 MCG/100ML IV SOLN
0.0000 ug/kg/h | INTRAVENOUS | Status: DC
  Administered 2023-09-13: 0.4 ug/kg/h via INTRAVENOUS

## 2023-09-13 MED ORDER — LORAZEPAM 2 MG/ML IJ SOLN
2.0000 mg | Freq: Once | INTRAMUSCULAR | Status: AC
  Administered 2023-09-13: 2 mg via INTRAVENOUS
  Filled 2023-09-13: qty 1

## 2023-09-13 MED ORDER — FENTANYL CITRATE PF 50 MCG/ML IJ SOSY
50.0000 ug | PREFILLED_SYRINGE | Freq: Once | INTRAMUSCULAR | Status: DC
  Filled 2023-09-13: qty 1

## 2023-09-13 MED ORDER — SUCCINYLCHOLINE CHLORIDE 200 MG/10ML IV SOSY
1.5000 mg/kg | PREFILLED_SYRINGE | Freq: Once | INTRAVENOUS | Status: AC
  Administered 2023-09-13: 78.4 mg via INTRAVENOUS
  Filled 2023-09-13: qty 10

## 2023-09-13 MED ORDER — IPRATROPIUM-ALBUTEROL 0.5-2.5 (3) MG/3ML IN SOLN
3.0000 mL | Freq: Once | RESPIRATORY_TRACT | Status: AC
  Administered 2023-09-13: 3 mL via RESPIRATORY_TRACT

## 2023-09-13 MED ORDER — NOREPINEPHRINE 4 MG/250ML-% IV SOLN
2.0000 ug/min | INTRAVENOUS | Status: DC
  Administered 2023-09-13: 2 ug/min via INTRAVENOUS

## 2023-09-13 MED ORDER — NOREPINEPHRINE 4 MG/250ML-% IV SOLN
0.0000 ug/min | INTRAVENOUS | Status: DC
  Administered 2023-09-13: 10 ug/min via INTRAVENOUS
  Administered 2023-09-14: 7 ug/min via INTRAVENOUS
  Administered 2023-09-14: 9 ug/min via INTRAVENOUS
  Administered 2023-09-14: 3 ug/min via INTRAVENOUS
  Administered 2023-09-15: 11 ug/min via INTRAVENOUS
  Administered 2023-09-15: 20 ug/min via INTRAVENOUS
  Administered 2023-09-15: 14 ug/min via INTRAVENOUS
  Administered 2023-09-15: 18 ug/min via INTRAVENOUS
  Administered 2023-09-15: 14 ug/min via INTRAVENOUS
  Filled 2023-09-13 (×9): qty 250

## 2023-09-13 MED ORDER — FENTANYL CITRATE (PF) 100 MCG/2ML IJ SOLN
INTRAMUSCULAR | Status: AC
  Administered 2023-09-13: 100 ug
  Filled 2023-09-13: qty 2

## 2023-09-13 MED ORDER — FENTANYL CITRATE PF 50 MCG/ML IJ SOSY: 50.0000 ug | PREFILLED_SYRINGE | INTRAMUSCULAR | Status: DC | PRN

## 2023-09-13 MED ORDER — DEXMEDETOMIDINE HCL IN NACL 400 MCG/100ML IV SOLN
INTRAVENOUS | Status: AC
  Administered 2023-09-13: 1.2 ug/kg/h via INTRAVENOUS
  Filled 2023-09-13: qty 100

## 2023-09-13 MED ORDER — MIDAZOLAM HCL 2 MG/2ML IJ SOLN
INTRAMUSCULAR | Status: AC
  Administered 2023-09-13: 2 mg via INTRAVENOUS
  Filled 2023-09-13: qty 2

## 2023-09-13 MED ORDER — INSULIN ASPART 100 UNIT/ML IJ SOLN
2.0000 [IU] | INTRAMUSCULAR | Status: DC
  Administered 2023-09-13 – 2023-09-14 (×4): 4 [IU] via SUBCUTANEOUS
  Administered 2023-09-14 (×2): 6 [IU] via SUBCUTANEOUS
  Filled 2023-09-13 (×6): qty 1

## 2023-09-13 MED ORDER — IPRATROPIUM-ALBUTEROL 0.5-2.5 (3) MG/3ML IN SOLN
3.0000 mL | RESPIRATORY_TRACT | Status: DC
  Administered 2023-09-13 – 2023-09-21 (×51): 3 mL via RESPIRATORY_TRACT
  Filled 2023-09-13 (×50): qty 3

## 2023-09-13 MED ORDER — ENSURE ENLIVE PO LIQD: 237.0000 mL | Freq: Three times a day (TID) | ORAL | Status: DC

## 2023-09-13 MED ORDER — NOREPINEPHRINE 4 MG/250ML-% IV SOLN: 0.0000 ug/min | INTRAVENOUS | Status: DC

## 2023-09-13 MED ORDER — HEPARIN BOLUS VIA INFUSION
1500.0000 [IU] | Freq: Once | INTRAVENOUS | Status: AC
  Administered 2023-09-13: 1500 [IU] via INTRAVENOUS
  Filled 2023-09-13: qty 1500

## 2023-09-13 MED ORDER — PANTOPRAZOLE SODIUM 40 MG IV SOLR
40.0000 mg | Freq: Every day | INTRAVENOUS | Status: DC
  Administered 2023-09-13 – 2023-09-15 (×3): 40 mg via INTRAVENOUS
  Filled 2023-09-13 (×3): qty 10

## 2023-09-13 MED ORDER — CHLORHEXIDINE GLUCONATE CLOTH 2 % EX PADS
6.0000 | MEDICATED_PAD | Freq: Every day | CUTANEOUS | Status: DC
  Administered 2023-09-13 – 2023-10-08 (×25): 6 via TOPICAL

## 2023-09-13 MED ORDER — ADULT MULTIVITAMIN W/MINERALS CH
1.0000 | ORAL_TABLET | Freq: Every day | ORAL | Status: DC
  Administered 2023-09-14 – 2023-09-29 (×15): 1
  Filled 2023-09-13 (×15): qty 1

## 2023-09-13 MED ORDER — THIAMINE MONONITRATE 100 MG PO TABS
100.0000 mg | ORAL_TABLET | Freq: Every day | ORAL | Status: AC
  Administered 2023-09-13 – 2023-09-17 (×5): 100 mg
  Filled 2023-09-13 (×5): qty 1

## 2023-09-13 MED ORDER — HEPARIN (PORCINE) 25000 UT/250ML-% IV SOLN
900.0000 [IU]/h | INTRAVENOUS | Status: DC
  Administered 2023-09-13: 800 [IU]/h via INTRAVENOUS
  Filled 2023-09-13: qty 250

## 2023-09-13 MED ORDER — FENTANYL 2500MCG IN NS 250ML (10MCG/ML) PREMIX INFUSION
50.0000 ug/h | INTRAVENOUS | Status: DC
Start: 2023-09-13 — End: 2023-09-22
  Administered 2023-09-13: 50 ug/h via INTRAVENOUS
  Administered 2023-09-14: 150 ug/h via INTRAVENOUS
  Administered 2023-09-15: 200 ug/h via INTRAVENOUS
  Administered 2023-09-15 – 2023-09-18 (×6): 150 ug/h via INTRAVENOUS
  Administered 2023-09-19: 50 ug/h via INTRAVENOUS
  Administered 2023-09-20: 150 ug/h via INTRAVENOUS
  Administered 2023-09-21 (×2): 200 ug/h via INTRAVENOUS
  Administered 2023-09-22: 150 ug/h via INTRAVENOUS
  Filled 2023-09-13 (×15): qty 250

## 2023-09-13 MED ORDER — POLYETHYLENE GLYCOL 3350 17 G PO PACK
17.0000 g | PACK | Freq: Every day | ORAL | Status: DC
  Administered 2023-09-13 – 2023-09-27 (×10): 17 g
  Filled 2023-09-13 (×11): qty 1

## 2023-09-13 MED ORDER — FENTANYL CITRATE PF 50 MCG/ML IJ SOSY
50.0000 ug | PREFILLED_SYRINGE | INTRAMUSCULAR | Status: AC | PRN
Start: 2023-09-13 — End: 2023-09-13
  Administered 2023-09-13 (×3): 50 ug via INTRAVENOUS
  Filled 2023-09-13: qty 1

## 2023-09-13 MED ORDER — ACETAMINOPHEN 325 MG PO TABS
650.0000 mg | ORAL_TABLET | Freq: Four times a day (QID) | ORAL | Status: DC | PRN
  Administered 2023-09-14 – 2023-09-26 (×6): 650 mg
  Filled 2023-09-13 (×7): qty 2

## 2023-09-13 MED ORDER — ETOMIDATE 2 MG/ML IV SOLN
20.0000 mg | Freq: Once | INTRAVENOUS | Status: AC
  Administered 2023-09-13: 20 mg via INTRAVENOUS
  Filled 2023-09-13: qty 10

## 2023-09-13 MED ORDER — SODIUM CHLORIDE 0.9% FLUSH
10.0000 mL | INTRAVENOUS | Status: DC | PRN
  Administered 2023-09-13: 10 mL

## 2023-09-13 MED ORDER — PROPOFOL 1000 MG/100ML IV EMUL
INTRAVENOUS | Status: AC
  Administered 2023-09-13: 5 ug/kg/min via INTRAVENOUS
  Filled 2023-09-13: qty 100

## 2023-09-13 MED ORDER — METHYLPREDNISOLONE SODIUM SUCC 40 MG IJ SOLR
40.0000 mg | Freq: Two times a day (BID) | INTRAMUSCULAR | Status: DC
  Administered 2023-09-13 – 2023-09-14 (×2): 40 mg via INTRAVENOUS
  Filled 2023-09-13 (×2): qty 1

## 2023-09-13 MED ORDER — SODIUM CHLORIDE 0.9% FLUSH
10.0000 mL | Freq: Two times a day (BID) | INTRAVENOUS | Status: DC
  Administered 2023-09-14 – 2023-09-15 (×3): 10 mL
  Administered 2023-09-15: 20 mL
  Administered 2023-09-16 – 2023-09-17 (×2): 10 mL
  Administered 2023-09-17: 20 mL
  Administered 2023-09-18 (×2): 10 mL
  Administered 2023-09-19: 35 mL
  Administered 2023-09-19: 40 mL
  Administered 2023-09-20 – 2023-09-25 (×12): 10 mL
  Administered 2023-09-26: 30 mL
  Administered 2023-09-26 – 2023-10-01 (×9): 10 mL
  Administered 2023-10-01: 20 mL
  Administered 2023-10-02 – 2023-10-10 (×15): 10 mL
  Administered 2023-10-10: 20 mL
  Administered 2023-10-11 – 2023-10-15 (×10): 10 mL
  Administered 2023-10-16: 20 mL
  Administered 2023-10-16 – 2023-10-17 (×2): 10 mL
  Administered 2023-10-17: 20 mL
  Administered 2023-10-18 – 2023-10-29 (×23): 10 mL

## 2023-09-13 MED ORDER — METHYLPREDNISOLONE SODIUM SUCC 40 MG IJ SOLR
40.0000 mg | Freq: Four times a day (QID) | INTRAMUSCULAR | Status: DC
  Administered 2023-09-13: 40 mg via INTRAVENOUS
  Filled 2023-09-13: qty 1

## 2023-09-13 MED ORDER — FENTANYL BOLUS VIA INFUSION
50.0000 ug | INTRAVENOUS | Status: DC | PRN
  Administered 2023-09-13: 50 ug via INTRAVENOUS
  Administered 2023-09-14 (×2): 100 ug via INTRAVENOUS
  Administered 2023-09-14: 50 ug via INTRAVENOUS
  Administered 2023-09-15 (×2): 100 ug via INTRAVENOUS
  Administered 2023-09-17: 50 ug via INTRAVENOUS
  Administered 2023-09-17: 100 ug via INTRAVENOUS
  Administered 2023-09-18 (×2): 50 ug via INTRAVENOUS
  Administered 2023-09-19 (×2): 75 ug via INTRAVENOUS
  Administered 2023-09-19: 100 ug via INTRAVENOUS
  Administered 2023-09-19 (×2): 50 ug via INTRAVENOUS
  Administered 2023-09-19: 100 ug via INTRAVENOUS
  Administered 2023-09-19 (×2): 50 ug via INTRAVENOUS
  Administered 2023-09-20 – 2023-09-21 (×10): 100 ug via INTRAVENOUS

## 2023-09-13 MED ORDER — MIDAZOLAM HCL 2 MG/2ML IJ SOLN: 2.0000 mg | Freq: Once | INTRAMUSCULAR | Status: AC

## 2023-09-13 MED ORDER — PIPERACILLIN-TAZOBACTAM 3.375 G IVPB
3.3750 g | Freq: Three times a day (TID) | INTRAVENOUS | Status: AC
  Administered 2023-09-13 – 2023-09-16 (×11): 3.375 g via INTRAVENOUS
  Filled 2023-09-13 (×11): qty 50

## 2023-09-13 NOTE — Progress Notes (Signed)
 Breathing treatment given to pt as asked, sats100%on 3.5l, pt yelling at this staff member I better do something, he can't breath. Cliffton Asters RN

## 2023-09-13 NOTE — Plan of Care (Signed)
  Problem: Education: Goal: Knowledge of General Education information will improve Description: Including pain rating scale, medication(s)/side effects and non-pharmacologic comfort measures Outcome: Not Progressing   Problem: Health Behavior/Discharge Planning: Goal: Ability to manage health-related needs will improve Outcome: Not Progressing   Problem: Clinical Measurements: Goal: Ability to maintain clinical measurements within normal limits will improve Outcome: Not Progressing Goal: Will remain free from infection Outcome: Not Progressing Goal: Diagnostic test results will improve Outcome: Not Progressing Goal: Respiratory complications will improve Outcome: Not Progressing Goal: Cardiovascular complication will be avoided Outcome: Not Progressing   Problem: Activity: Goal: Risk for activity intolerance will decrease Outcome: Not Progressing   Problem: Nutrition: Goal: Adequate nutrition will be maintained Outcome: Not Progressing   Problem: Coping: Goal: Level of anxiety will decrease Outcome: Not Progressing   Problem: Elimination: Goal: Will not experience complications related to bowel motility Outcome: Not Progressing Goal: Will not experience complications related to urinary retention Outcome: Not Progressing   Problem: Pain Management: Goal: General experience of comfort will improve Outcome: Not Progressing   Problem: Safety: Goal: Ability to remain free from injury will improve Outcome: Not Progressing   Problem: Skin Integrity: Goal: Risk for impaired skin integrity will decrease Outcome: Not Progressing   Problem: Education: Goal: Knowledge of disease or condition will improve Outcome: Not Progressing Goal: Knowledge of the prescribed therapeutic regimen will improve Outcome: Not Progressing Goal: Individualized Educational Video(s) Outcome: Not Progressing   Problem: Activity: Goal: Ability to tolerate increased activity will  improve Outcome: Not Progressing Goal: Will verbalize the importance of balancing activity with adequate rest periods Outcome: Not Progressing   Problem: Respiratory: Goal: Ability to maintain a clear airway will improve Outcome: Not Progressing Goal: Levels of oxygenation will improve Outcome: Not Progressing Goal: Ability to maintain adequate ventilation will improve Outcome: Not Progressing

## 2023-09-13 NOTE — Significant Event (Signed)
 Rapid Response Event Note   Reason for Call : called for resp distress for pt on bipap waiting on 2A bed   Initial Focused Assessment: sitting up in bed, pulling at bipap, working to breathe. Previously given ativan .      Interventions: Dr Isidore to bedside, transfer to stepdown, stat abg.   Plan of Care: Moved to ICU 10, Dr Isadora assumed care, and pt ultimately intubated.    Event Summary: as above  MD Notified: Benjamin Bolton [phone] Call [phone] Arrival Upfz:9167 End Upfz:9084  Benjamin Bolton A, RN

## 2023-09-13 NOTE — Progress Notes (Signed)
 Cliffton Asters NP in to see patient, orders given

## 2023-09-13 NOTE — Consult Note (Signed)
 PHARMACY - ANTICOAGULATION CONSULT NOTE  Pharmacy Consult for IV Heparin  Indication:  Hx DVT (apixaban  on hold)  Patient Measurements: Height: 6' (182.9 cm) Weight: 52.2 kg (115 lb 1.3 oz) IBW/kg (Calculated) : 77.6 Heparin  Dosing Weight: 52.2 kg  Labs: Recent Labs    09/10/23 1939 09/11/23 0530 09/13/23 1148 09/13/23 1350 09/13/23 1725  HGB  --  11.9* 13.3  --   --   HCT  --  36.1* 41.3  --   --   PLT  --  192 247  --   --   APTT  --   --  25  --  48*  LABPROT  --   --  14.0  --   --   INR  --   --  1.1  --   --   CREATININE  --  0.64  --  0.96  --   TROPONINIHS 9  --   --   --   --     Estimated Creatinine Clearance: 59.7 mL/min (by C-G formula based on SCr of 0.96 mg/dL).  Medical History: Past Medical History:  Diagnosis Date   COPD (chronic obstructive pulmonary disease) (HCC)    DVT (deep venous thrombosis) (HCC)    Dyspnea    GERD (gastroesophageal reflux disease)    no meds   HLD (hyperlipidemia)    Hx of migraines    Oxygen dependent    3 L [place]   Pneumonia 2023   Pre-diabetes    Pulmonary nodule    Rotator cuff tear, right    Tobacco abuse    Tobacco use     Medications:  Apixaban  5 mg BID, last dose 09/12/23 at 2206  Assessment: 63 y/o M with medical history as above and including hx DVT on apixaban  who is admitted with acute on chronic respiratory failure and now intubated. Pharmacy consulted for IV heparin .  Baseline aPTT and INR are pending. Check a CBC  Date/Time aPTT Rate  Comment 1/11 1725 48 800 units/hr Subtherapeutic   Goal of Therapy:  aPTT 66 - 102 seconds Monitor platelets by anticoagulation protocol: Yes   Plan:  --Heparin  level subtherapeutic on current rate --Give 1500 unit bolus x 1 --Increase heparin  infusion rate to 950 units/hr --Check aPTT 6 hours from rate change.  --Check heparin  level tomorrow AM --Daily CBC per protocol while on IV heparin   Lum VEAR Mania, PharmD Clinical Pharmacist  09/13/2023,6:33 PM

## 2023-09-13 NOTE — Consult Note (Signed)
 NAME:  Benjamin Bolton, MRN:  [id number], DOB:  Nov 21, 1960, LOS: 3 ADMISSION DATE:  09/10/2023, CONSULTATION DATE:  09/13/2023 REFERRING MD:  Concepcion Riser, MD, CHIEF COMPLAINT:  Respiratory Failure  History of Present Illness:   [name] is a 63 year old male with a history of severe COPD followed by [facility] Pulmonology who presents to the hospital from [facility] clinic on 1/8 with increased shortness of breath. He was admitted to the medicine service for management of COPD exacerbation and found to have an RSV infection.  Patient was admitted to [facility] and initiated on nebulizers, steroids, and antibiotics. He was managed medically for COPD exacerbation and UTI but developed worsening respiratory distress this AM requiring the initiation of BiPAP. Rapid response was called and he was transferred to the ICU. Initial blood gas showed pH of 7.2, with repeat VBG showing he remained acidemic at 7.18. Given altered mental status and respiratory failure, decision made at the bedside to proceed with emergent intubation.  Pertinent  Medical History   -DVT on Eliquis  -COPD on 2L O2 -DM  Significant Hospital Events: Including procedures, antibiotic start and stop dates in addition to other pertinent events   1/8: admit to [facility] 1/11: rapid response, transfer to ICU, intubated   Objective   Blood pressure (!) 143/109, pulse (!) 149, temperature 97.9 F (36.6 C), temperature source Axillary, resp. rate (!) 24, height 6' (1.829 m), weight 52.2 kg, SpO2 100%.    FiO2 (%):  [3.5 %-50 %] 21 %   Intake/Output Summary (Last 24 hours) at 09/13/2023 0916 Last data filed at 09/13/2023 0336 Gross per 24 hour  Intake --  Output 350 ml  Net -350 ml   Filed Weights   09/10/23 1302 09/13/23 0854  Weight: 60.8 kg 52.2 kg    Examination: Physical Exam Constitutional:      General: He is in acute distress.     Appearance: He is ill-appearing.  Neck:     Comments: Distended external jugulars Cardiovascular:      Rate and Rhythm: Regular rhythm. Tachycardia present.     Heart sounds: Normal heart sounds.  Pulmonary:     Breath sounds: Wheezing present. No rales.     Comments: Decreased breath sounds bilaterally Neurological:     Mental Status: He is disoriented.     Assessment & Plan:   Neurology #Toxic Metabolic Encephalopathy #CO2 Narcosis  Secondary to severe COPD with underlying chronic hypercapnia. Failed bipap and now intubated and mechanically ventilated.  -Maintain a RASS goal of -1 -Propofol  and Fentanyl  to maintain RASS goal -Daily wake up assessment  Cardiovascular #Sinus Tachycardia  In the setting of severe COPD exacerbation and respiratory failure. Other differentials included pneumothorax (ruled out by CXR) and VTE (history of DVT, but is maintained on apixaban , switched to heparin  gtt).  -cardiac monitoring  Pulmonary #Acute on Chronic Hypoxic and Hypercapnic Respiratory Failure #COPD Exacerbation #RSV Pneumonia #Community Acquired Pneumonia  Patient with reported severe COPD maintained on oxygen and inhaler therapy outpatient. I do not have access to his outpatient PFT's but overall presentation and imaging consistent with COPD exacerbation. CXR without focal infiltrates nor pneumothorax. Physical exam notable for expiratory wheeze and decreased breath sounds bilaterally. Presents with AECOPD secondary to RSV infection, with potential for super imposed pneumonia. Intubated and currently ventilated, with significant improvement in blood gas. Attempting to optimize minute ventilation while allowing ample time for exhalation by decreased inspiratory time and watching for auto-PEEP. He is anti-coagulated and is now switched to IV  heparin  given history of DVT so I don't see benefit in obtaining a CT/PE at this point in time. Will continue standing nebulizers and steroids therapy.  -Full vent support, lung protective strategies, decreased Ti, watch for auto-PEEP -Plateau  pressures less than 30 cm H20 -Wean FiO2 & PEEP as tolerated to maintain O2 sats >92% -Follow intermittent Chest X-ray & ABG as needed -SBT when improved -Implement VAP Bundle -standing duo-nebs, d/c revefenacin  -methyl-pred to 40 bid -antibiotics as below  Gastrointestinal  PPI for SUP, initiate tube feeds  Renal  Kidney function at baseline on 1/9. Will repeat CMP today.  Endocrine  ICU glycemic protocol given history of pre-diabetes and initiation of steroids  Hem/Onc  Switch apixaban  to IV heparin   ID #RSV Infection #Community Acquired Pneumonia  Respiratory viral panel positive for RSV and cultures with multiple organisms. Given worsening respiratory status prudent to continue with broad spectrum antibiotics covering pneumonia. Suspect urine culture is contaminated but nonetheless will be covered with antibiotics.  -switch CTX to zosyn   Patient's sister and niece was updated over the phone.   Best Practice (right click and Reselect all SmartList Selections daily)   Diet/type: tubefeeds DVT prophylaxis systemic heparin  Pressure ulcer(s): N/A GI prophylaxis: PPI Lines: N/A Foley:  Yes, and it is still needed Code Status:  full code Last date of multidisciplinary goals of care discussion [09/13/2023]  Labs   CBC: Recent Labs  Lab 09/10/23 1312 09/11/23 0530  WBC 14.2* 7.6  NEUTROABS 12.4*  --   HGB 15.2 11.9*  HCT 46.8 36.1*  MCV 94.7 94.5  PLT 242 192    Basic Metabolic Panel: Recent Labs  Lab 09/10/23 1312 09/11/23 0530  NA 138 140  K 3.7 3.9  CL 101 105  CO2 24 26  GLUCOSE 162* 170*  BUN 12 14  CREATININE 0.69 0.64  CALCIUM 9.0 8.3*   GFR: Estimated Creatinine Clearance: 71.6 mL/min (by C-G formula based on SCr of 0.64 mg/dL). Recent Labs  Lab 09/10/23 1312 09/10/23 1315 09/10/23 1535 09/10/23 1823 09/10/23 1939 09/11/23 0530  WBC 14.2*  --   --   --   --  7.6  LATICACIDVEN  --  1.6 2.7* 1.1 1.8  --     Liver Function  Tests: Recent Labs  Lab 09/10/23 1312  AST 20  ALT 20  ALKPHOS 79  BILITOT 1.0  PROT 7.1  ALBUMIN  3.8   No results for input(s): LIPASE, AMYLASE in the last 168 hours. No results for input(s): AMMONIA in the last 168 hours.  ABG    Component Value Date/Time   PHART 7.2 (L) 09/13/2023 0844   PCO2ART 84 (HH) 09/13/2023 0844   PO2ART 32 (LL) 09/13/2023 0844   HCO3 32.8 (H) 09/13/2023 0844   O2SAT 43.5 09/13/2023 0844     Coagulation Profile: Recent Labs  Lab 09/10/23 1312  INR 1.2    Cardiac Enzymes: No results for input(s): CKTOTAL, CKMB, CKMBINDEX, TROPONINI in the last 168 hours.  HbA1C: No results found for: HGBA1C  CBG: Recent Labs  Lab 09/13/23 0632 09/13/23 0841 09/13/23 0857  GLUCAP 137* 255* 263*    Review of Systems:   N/A  Past Medical History:  He,  has a past medical history of COPD (chronic obstructive pulmonary disease) (HCC), DVT (deep venous thrombosis) (HCC), Dyspnea, GERD (gastroesophageal reflux disease), HLD (hyperlipidemia), migraines, Oxygen dependent, Pneumonia (2023), Pre-diabetes, Pulmonary nodule, Rotator cuff tear, right, Tobacco abuse, and Tobacco use.   Surgical History:   Past Surgical History:  Procedure Laterality Date   CHEST TUBE INSERTION     SHOULDER ARTHROSCOPY WITH SUBACROMIAL DECOMPRESSION, ROTATOR CUFF REPAIR AND BICEP TENDON REPAIR Right 02/18/2023   Procedure: RIGHT SHOULDER ARTHROSCOPY WITH DEBRIDEMENT, DECOMPRESSION, ROTATOR CUFF REPAIR, BICEPS TENODESIS.;  Surgeon: Edie Norleen PARAS, MD;  Location: [facility] ORS;  Service: Orthopedics;  Laterality: Right;     Social History:   reports that he has been smoking cigarettes. He has a 12 pack-year smoking history. He has never used smokeless tobacco. He reports current alcohol  use. He reports that he does not use drugs.   Family History:  His family history is not on file.   Allergies No Active Allergies   Home Medications  Prior to Admission  medications   Medication Sig Start Date End Date Taking? Authorizing Provider  albuterol  (PROVENTIL ) (2.5 MG/3ML) 0.083% nebulizer solution USE 1 VIAL IN NEBULIZER EVERY 6 HOURS AS NEEDED FOR WHEEZING 06/28/21  Yes [provider]  albuterol  (VENTOLIN  HFA) 108 (90 Base) MCG/ACT inhaler Inhale 1 puff into the lungs every 4 (four) hours as needed for wheezing or shortness of breath.   Yes [provider]  APIXABAN  (ELIQUIS ) VTE STARTER PACK (10MG  AND 5MG ) Take as directed on package: start with two-5mg  tablets twice daily for 7 days. On day 8, switch to one-5mg  tablet twice daily. 02/26/23  Yes Bradler, Evan K, MD  ondansetron  (ZOFRAN ) 4 MG tablet Take 1 tablet (4 mg total) by mouth every 6 (six) hours as needed for nausea. 02/19/23  Yes Kip Lynwood Double, PA-C  oxyCODONE  (OXY IR/ROXICODONE ) 5 MG immediate release tablet Take 1 tablet by mouth 2 (two) times daily as needed. 08/29/23  Yes [provider]  OXYGEN Inhale 3 L into the lungs continuous.   Yes [provider]  predniSONE  (DELTASONE ) 10 MG tablet Take 2-6 tablets by mouth daily with breakfast.  ORIGINAL DPH:ujxz 6 tabs daily x4 days; then 4 tabs daily x4 days; then 2 tabs daily x4 days.   Yes [provider]  predniSONE  (DELTASONE ) 20 MG tablet Take 20 mg by mouth daily with breakfast.   Yes [provider]  TRELEGY ELLIPTA 100-62.5-25 MCG/ACT AEPB Inhale 1 puff into the lungs every morning. 07/04/21  Yes [provider]  amoxicillin-clavulanate (AUGMENTIN) 875-125 MG tablet Take 1 tablet by mouth every 12 (twelve) hours. Patient not taking: Reported on 09/10/2023    [provider]  benralizumab (FASENRA PEN) 30 MG/ML prefilled autoinjector Inject 30 mg into the skin as directed. Patient not taking: Reported on 09/10/2023 07/23/23   [provider]  nystatin (MYCOSTATIN) 100000 UNIT/ML suspension Take 5 mLs by mouth 4 (four) times daily. Patient not taking: Reported  on 09/10/2023 09/04/23   [provider]  omeprazole  (PRILOSEC ) 20 MG capsule Take 20 mg by mouth daily. Patient not taking: Reported on 09/10/2023    [provider]  oxyCODONE  (OXY IR/ROXICODONE ) 5 MG immediate release tablet Take 1-2 tablets (5-10 mg total) by mouth every 4 (four) hours as needed for severe pain. 02/19/23   Kip Lynwood Double, PA-C     Critical care time: 100 minutes     [name], MD  Pulmonary Critical Care 09/13/2023 1:38 PM

## 2023-09-13 NOTE — Progress Notes (Addendum)
 Initial Nutrition Assessment  DOCUMENTATION CODES:  Underweight  INTERVENTION:  Initiate tube feeding via OGT: Vital 1.5 at 55 ml/h (1320 ml per day) Start at 27mL/h and advance by 10mL q8h to goal Prosource TF20 60 ml 1x/d 30mL free water  flush q4h to maintain tube patency Provides 2060 kcal, 109 gm protein, 1008 ml free water  daily ( TF +flush) Monitor magnesium  and phosphorus every 12 hours x 4 occurrences, MD to replete as needed, as pt is at risk for refeeding syndrome given underweight BMI. 100mg  thiamine  x 5 days MVI with minerals daily   NUTRITION DIAGNOSIS:   Increased nutrient needs related to chronic illness (COPD) as evidenced by estimated needs.  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 Found to have a COPD exacerbation.  RD working remotely  Unable to reach pt on room phone at this time. Discussed with RN, unsure how pt was eating in prior days of admission but has not had breakfast this AM. Recently was placed on BiPAP and will be transferring to progressive care unit when bed is available.   Reviewed dining software and all meals have been ordered and pt has received Ensure BID. Will increase to TID as an easy to consume supplement while pt is on BiPAP and add additional interventions to tray to encourage adequate intake and weight gain. If pt is to remain on BiPAP for an extended period of time and unable to take in PO, will need an NGT for enteral access as malnutrition is likely present based on chronically low BMI.  PM Addendum: Noted that pt had further respiratory decline and transferred to ICU and intubated. Discussed nutrition concerns with CCM and ok to start enteral feeds. Pt is at  risk of refeeding due to his underweight BMI so will initiate enteral feeds slowly and add thiamine . OGT on imaging with sideport in the gastric cardia.   Admit / Current weight: 60.8 kg  Per chart review, overall stable weight for several years. Will request new measured to ensure accuracy of current weight.   Nutritionally Relevant Medications: Scheduled Meds:  Ensure Plus High Protein  237 mL Oral BID BM   methylPREDNISolone    40 mg Intravenous Q12H   Continuous Infusions:  cefTRIAXone  (ROCEPHIN )  IV 1 g (09/12/23 2342)   PRN Meds: ondansetron   Labs Reviewed  NUTRITION - FOCUSED PHYSICAL EXAM: Defer to in-person assessment  Diet Order:   Diet Order     None       EDUCATION NEEDS:   Not appropriate for education at this time  Skin:  Skin Assessment: Reviewed RN Assessment  Last BM:  1/9  Height:   Ht Readings from Last 1 Encounters:  09/10/23 6' (1.829 m)    Weight:   Wt Readings from Last 1 Encounters:  09/13/23 52.2 kg    Ideal Body Weight:  80.9 kg  BMI:  Body mass index is 15.61 kg/m.  Estimated Nutritional Needs:  Kcal:  1900-2100 kcal/d Protein:  95-110 g/d Fluid:  2L/d    Vernell Lukes, RD, LDN Registered Dietitian II Please reach out via secure chat Weekend on-call pager # available in [facility]

## 2023-09-13 NOTE — Procedures (Signed)
 Intubation Procedure Note  Benjamin Bolton  [id number]  1960-10-11  Date:09/13/23  Time:9:49 AM   Provider Performing:Shantea Poulton, MD; Harland Redo, RT   Procedure: Intubation (31500)  Indication(s) Respiratory Failure  Consent Unable to obtain consent due to emergent nature of procedure.   Anesthesia Etomidate  and Succinylcholine    Time Out Verified patient identification, verified procedure, site/side was marked, verified correct patient position, special equipment/implants available, medications/allergies/relevant history reviewed, required imaging and test results available.   Sterile Technique Usual hand hygeine, masks, and gloves were used   Procedure Description Patient positioned in bed supine.  Sedation given as noted above.  Patient was intubated with endotracheal tube using Glidescope. This procedure was accomplished by myself and RT [name] under my direct supervision. View was Grade 1 full glottis .  Number of attempts was 1.  Colorimetric CO2 detector was consistent with tracheal placement.   Complications/Tolerance None; patient tolerated the procedure well. Chest X-ray is ordered to verify placement.   EBL Minimal   Specimen(s) None  [name], MD [place] [facility] 09/13/2023 9:49 AM

## 2023-09-13 NOTE — Progress Notes (Signed)
 Progress Note   [name] FMW:[id number] DOB: April 12, 1961 DOA: 09/10/2023     3 DOS: the patient was seen and examined on 09/13/2023   Brief hospital course:  Benjamin Bolton is a 63 y.o. male with medical history significant of COPD on 3L O2, HLD, pre-DM, DVT on Eliquis , who presents with SOB.  Patient is admitted to the hospitalist service for further management evaluation of COPD exacerbation.  09/13/23 Rapid response called as he is more altered, confused, removing Bipap. He is transferred to ICU and decision made to intubate him  Assessment and Plan: Acute on chronic respiratory failure with hypoxia and Hypercapnia COPD exacerbation:  RSV positive- This morning he was struggling to breath, Bipap ordered. He is more agitated upon my exam, got Ativan .  Rapid response team at bedside, ABG done, transferred to SDU, PCCM consulted. ABG showed Ph 7.18, pco2 87, po2 67. He remains tachycardic, tachypneic. Patient need to be intubated.  I discussed with Dr. Isadora. He will be taken over by [facility] service.   Acute metabolic encephalopathy In the setting of hypoxia and hypercapnia. Patient got Ativan . Continue neurochecks.   HLD (hyperlipidemia) Continue Crestor   UTI (urinary tract infection) Finished 3 days of IV rocephin  Urine culture grew multiple species.   Tobacco abuse:  Encouraged to quit smoking. Ordered nicotine  patch.   Protein-calorie malnutrition, severe (HCC):  BMI 15.6 Dietician consult. Will need NG tube feeds if intubated.  He wants to go to shelter after discharge, as he does not feel safe at current living situation. TOC consulted.    Nursing supportive care. Fall, aspiration precautions. DVT prophylaxis   Code Status: Full Code  Subjective: Patient is seen and examined today morning.  He is severely short of breath, on Bipap. Rapid response called as he is agitated, confused.   Physical Exam: Vitals:   09/13/23 0545 09/13/23 0734 09/13/23  0807 09/13/23 0854  BP:   (!) 166/98 (!) 143/109  Pulse:  (!) 119 (!) 121 (!) 149  Resp:  (!) 27 (!) 22 (!) 24  Temp:   98.2 F (36.8 C) 97.9 F (36.6 C)  TempSrc:    Axillary  SpO2: 100% 99% (!) 86% 100%  Weight:    52.2 kg  Height:        General -  Middle aged ill looking African-American male, severe respiratory distress HEENT - PERRLA, EOMI, atraumatic head, non tender sinuses. Lung - decreased breath sounds, diffuse wheezes, crackles. Heart - S1, S2 heard, no murmurs, rubs, trace pedal edema. Abdomen - Soft, non tender, nondistended, bowel sounds good Neuro - Agitated, confused, unable to follow commands. Skin - Warm and dry.  Data Reviewed:      Latest Ref Rng & Units 09/11/2023    5:30 AM 09/10/2023    1:12 PM 02/26/2023    2:59 PM  CBC  WBC 4.0 - 10.5 K/uL 7.6  14.2  8.0   Hemoglobin 13.0 - 17.0 g/dL 88.0  84.7  86.8   Hematocrit 39.0 - 52.0 % 36.1  46.8  41.1   Platelets 150 - 400 K/uL 192  242  209       Latest Ref Rng & Units 09/11/2023    5:30 AM 09/10/2023    1:12 PM 02/26/2023    2:59 PM  BMP  Glucose 70 - 99 mg/dL 829  837  868   BUN 8 - 23 mg/dL 14  12  22    Creatinine 0.61 - 1.24 mg/dL 9.35  9.30  1.15   Sodium 135 - 145 mmol/L 140  138  140   Potassium 3.5 - 5.1 mmol/L 3.9  3.7  4.4   Chloride 98 - 111 mmol/L 105  101  106   CO2 22 - 32 mmol/L 26  24  28    Calcium 8.9 - 10.3 mg/dL 8.3  9.0  8.9    DG Abd 1 View Result Date: 09/13/2023 CLINICAL DATA:  63 year old male intubated and enteric tube placement. EXAM: ABDOMEN - 1 VIEW COMPARISON:  Portable chest today. FINDINGS: AP supine view 0943 hours. Enteric tube placed into the stomach, side hole the level of the gastric cardia. Negative left lung base. Nonobstructed bowel-gas pattern. IMPRESSION: Satisfactory enteric tube placement into the stomach. Electronically Signed   By: VEAR Hurst M.D.   On: 09/13/2023 10:07   DG Chest Port 1 View Result Date: 09/13/2023 CLINICAL DATA:  63 year old male intubated,  enteric tube placement. EXAM: PORTABLE CHEST 1 VIEW COMPARISON:  0904 hours today. FINDINGS: Portable AP upright view at 0944 hours. Intubated now. Endotracheal tube tip in good position over the tracheal air column between the clavicles and carina. Enteric tube courses toward the abdomen. Large lung volumes. Stable ventilation. Stable cardiac size and mediastinal contours. No pneumothorax. Stable visualized osseous structures. IMPRESSION: 1. Satisfactory endotracheal tube. Enteric tube courses to the abdomen. 2. Hyperinflation with stable lung ventilation. Electronically Signed   By: VEAR Hurst M.D.   On: 09/13/2023 10:01   DG Chest Port 1 View Result Date: 09/13/2023 CLINICAL DATA:  Acute hypoxic respiratory failure EXAM: PORTABLE CHEST 1 VIEW COMPARISON:  09/10/2023 FINDINGS: There is hyperinflation of the lungs compatible with COPD. Heart and mediastinal contours are within normal limits. No focal opacities or effusions. No acute bony abnormality. IMPRESSION: COPD.  No active disease. Electronically Signed   By: Franky Crease M.D.   On: 09/13/2023 09:12   Disposition: Status is: Inpatient Remains inpatient appropriate because: IV steroids, IV antibiotics for COPD, ICU level of care  Planned Discharge Destination:  shelter     MDM level 3-patient is severely short of breath, hypoxic, hypercapnic, pH of 7.18 on ABG.  Patient need ICU level of care as he will be intubated.  Patient is at high risk for sudden clinical deterioration  Author: Concepcion Riser, MD 09/13/2023 11:09 AM Secure chat 7am to 7pm For on call review [email or web address].

## 2023-09-13 NOTE — Consult Note (Signed)
 PHARMACY - ANTICOAGULATION CONSULT NOTE  Pharmacy Consult for IV Heparin  Indication:  Hx DVT (apixaban  on hold)  Patient Measurements: Height: 6' (182.9 cm) Weight: 52.2 kg (115 lb 1.3 oz) IBW/kg (Calculated) : 77.6 Heparin  Dosing Weight: 52.2 kg  Labs: Recent Labs    09/10/23 1312 09/10/23 1939 09/11/23 0530  HGB 15.2  --  11.9*  HCT 46.8  --  36.1*  PLT 242  --  192  LABPROT 15.4*  --   --   INR 1.2  --   --   CREATININE 0.69  --  0.64  TROPONINIHS  --  9  --     Estimated Creatinine Clearance: 71.6 mL/min (by C-G formula based on SCr of 0.64 mg/dL).  Medical History: Past Medical History:  Diagnosis Date   COPD (chronic obstructive pulmonary disease) (HCC)    DVT (deep venous thrombosis) (HCC)    Dyspnea    GERD (gastroesophageal reflux disease)    no meds   HLD (hyperlipidemia)    Hx of migraines    Oxygen dependent    3 L [place]   Pneumonia 2023   Pre-diabetes    Pulmonary nodule    Rotator cuff tear, right    Tobacco abuse    Tobacco use     Medications:  Apixaban  5 mg BID, last dose 09/12/23 at 2206  Assessment: 63 y/o M with medical history as above and including hx DVT on apixaban  who is admitted with acute on chronic respiratory failure and now intubated. Pharmacy consulted for IV heparin .  Baseline aPTT and INR are pending. Check a CBC  Goal of Therapy:  aPTT 66 - 102 seconds Monitor platelets by anticoagulation protocol: Yes   Plan:  --Start heparin  at 800 units/hr --Check aPTT 6 hours from start. Check heparin  level tomorrow AM --Daily CBC per protocol while on IV heparin   Donnis Pecha B Nicholos Aloisi 09/13/2023,11:11 AM

## 2023-09-13 NOTE — Progress Notes (Signed)
 Peripherally Inserted Central Catheter Placement  The IV Nurse has discussed with the patient and/or persons authorized to consent for the patient, the purpose of this procedure and the potential benefits and risks involved with this procedure.  The benefits include less needle sticks, lab draws from the catheter, and the patient may be discharged home with the catheter. Risks include, but not limited to, infection, bleeding, blood clot (thrombus formation), and puncture of an artery; nerve damage and irregular heartbeat and possibility to perform a PICC exchange if needed/ordered by physician.  Alternatives to this procedure were also discussed.  Bard Power PICC patient education guide, fact sheet on infection prevention and patient information card has been provided to patient /or left at bedside.  Telephone consent obtained from sister.  PICC Placement Documentation  PICC Triple Lumen 09/13/23 Right Brachial 40 cm 0 cm (Active)  Indication for Insertion or Continuance of Line Vasoactive infusions 09/13/23 1617  Exposed Catheter (cm) 0 cm 09/13/23 1617  Site Assessment Clean, Dry, Intact 09/13/23 1617  Lumen #1 Status Saline locked;Flushed;Blood return noted 09/13/23 1617  Lumen #2 Status Flushed;Saline locked;Blood return noted 09/13/23 1617  Lumen #3 Status Flushed;Saline locked;Blood return noted 09/13/23 1617  Dressing Type Transparent;Securing device 09/13/23 1617  Dressing Status Antimicrobial disc/dressing in place;Clean, Dry, Intact 09/13/23 1617  Line Care Connections checked and tightened 09/13/23 1617  Line Adjustment (NICU/IV Team Only) No 09/13/23 1617  Dressing Intervention New dressing;Adhesive placed at insertion site (IV team only);Adhesive placed around edges of dressing (IV team/ICU RN only) 09/13/23 1617  Dressing Change Due 09/20/23 09/13/23 1617       Benjamin Bolton 09/13/2023, 4:17 PM

## 2023-09-13 NOTE — Progress Notes (Addendum)
 Notified by RN of abnormal EKG  ST elevation in inferior leads as below:    I discussed with on call Cardiology who reviewed the EKG and does not think its acute or meets STEMI criteria. Cardiology recommends continuing with Heparin  gtt and trending troponin until peaked they will follow in the am.   Almarie Nose, DNP, CCRN, FNP-C, AGACNP-BC Acute Care & Family Nurse Practitioner  [facility]  See Amion for personal pager PCCM on call pager [phone] until 7 am

## 2023-09-13 NOTE — Progress Notes (Signed)
       CROSS COVER NOTE  NAME: Benjamin Bolton MRN: [id number] DOB : 1961-04-25 ATTENDING PHYSICIAN: Darci Pore, MD    Date of Service   09/13/2023   HPI/Events of Note   Acute shortness of breath as previously experienced. Only partially relieved with morphine  dose. Sats are good/ Patient endorses BIPAP has helped in the past  Interventions   Assessment/Plan: BIPAP prn  Transfer 2A        Erminio LITTIE Cone NP [facility] Cross Cover 7pm-7am - check amion for availability Pager [phone]

## 2023-09-13 NOTE — IPAL (Signed)
  Interdisciplinary Goals of Care Family Meeting   Date carried out: 09/13/2023  Location of the meeting: Bedside  Member's involved: Physician and Family Member or next of kin  Durable Power of Attorney or environmental health practitioner: Benjamin Bolton's wife, son, and two sisters.    Discussion: We discussed goals of care for Benjamin Bolton .  I explained to them the overall critical nature of Benjamin Bolton condition with respiratory failure secondary to RSV infection resulting in COPD exacerbation. Explained events that occurred throughout today, with Benjamin Bolton being intubated due to severe respiratory failure. All the questions were answered.  Code status:   Code Status: Full Code   Disposition: Continue current acute care  Time spent for the meeting: 15 minutes    [name], MD  09/13/2023, 5:51 PM

## 2023-09-14 ENCOUNTER — Inpatient Hospital Stay
Admit: 2023-09-14 | Discharge: 2023-09-14 | Disposition: A | Discharge status: Home / Self Care | Payer: 59 | Attending: Cardiology | Admitting: Cardiology

## 2023-09-14 ENCOUNTER — Inpatient Hospital Stay: Payer: 59

## 2023-09-14 DIAGNOSIS — E43 Unspecified severe protein-calorie malnutrition: Secondary | ICD-10-CM | POA: Diagnosis not present

## 2023-09-14 DIAGNOSIS — J9621 Acute and chronic respiratory failure with hypoxia: Secondary | ICD-10-CM | POA: Diagnosis not present

## 2023-09-14 DIAGNOSIS — J690 Pneumonitis due to inhalation of food and vomit: Secondary | ICD-10-CM

## 2023-09-14 DIAGNOSIS — I824Y9 Acute embolism and thrombosis of unspecified deep veins of unspecified proximal lower extremity: Secondary | ICD-10-CM | POA: Diagnosis not present

## 2023-09-14 DIAGNOSIS — J441 Chronic obstructive pulmonary disease with (acute) exacerbation: Secondary | ICD-10-CM | POA: Diagnosis not present

## 2023-09-14 LAB — BLOOD GAS, ARTERIAL
Acid-Base Excess: 4.8 mmol/L — ABNORMAL HIGH (ref 0.0–2.0)
Bicarbonate: 30.4 mmol/L — ABNORMAL HIGH (ref 20.0–28.0)
FIO2: 30 %
MECHVT: 480 mL
Mechanical Rate: 22
O2 Saturation: 98.6 %
PEEP: 5 cmH2O
Patient temperature: 37
pCO2 arterial: 48 mm[Hg] (ref 32–48)
pH, Arterial: 7.41 (ref 7.35–7.45)
pO2, Arterial: 93 mm[Hg] (ref 83–108)

## 2023-09-14 LAB — CBC
HCT: 36.4 % — ABNORMAL LOW (ref 39.0–52.0)
Hemoglobin: 11.6 g/dL — ABNORMAL LOW (ref 13.0–17.0)
MCH: 30.7 pg (ref 26.0–34.0)
MCHC: 31.9 g/dL (ref 30.0–36.0)
MCV: 96.3 fL (ref 80.0–100.0)
Platelets: 201 10*3/uL (ref 150–400)
RBC: 3.78 MIL/uL — ABNORMAL LOW (ref 4.22–5.81)
RDW: 14.6 % (ref 11.5–15.5)
WBC: 11.6 10*3/uL — ABNORMAL HIGH (ref 4.0–10.5)
nRBC: 0 % (ref 0.0–0.2)

## 2023-09-14 LAB — CULTURE, RESPIRATORY W GRAM STAIN

## 2023-09-14 LAB — MAGNESIUM
Magnesium: 2.4 mg/dL (ref 1.7–2.4)
Magnesium: 2.2 mg/dL (ref 1.7–2.4)

## 2023-09-14 LAB — BASIC METABOLIC PANEL
Anion gap: 12 (ref 5–15)
BUN: 26 mg/dL — ABNORMAL HIGH (ref 8–23)
CO2: 27 mmol/L (ref 22–32)
Calcium: 8.7 mg/dL — ABNORMAL LOW (ref 8.9–10.3)
Chloride: 94 mmol/L — ABNORMAL LOW (ref 98–111)
Creatinine, Ser: 0.88 mg/dL (ref 0.61–1.24)
GFR, Estimated: >60 mL/min (ref >60)
Glucose, Bld: 249 mg/dL — ABNORMAL HIGH (ref 70–99)
Potassium: 4.2 mmol/L (ref 3.5–5.1)
Sodium: 133 mmol/L — ABNORMAL LOW (ref 135–145)

## 2023-09-14 LAB — APTT
aPTT: 106 s — ABNORMAL HIGH (ref 24–36)
aPTT: 89 s — ABNORMAL HIGH (ref 24–36)
aPTT: 100 s — ABNORMAL HIGH (ref 24–36)
aPTT: 125 s — ABNORMAL HIGH (ref 24–36)

## 2023-09-14 LAB — GLUCOSE, CAPILLARY
Glucose-Capillary: 229 mg/dL — ABNORMAL HIGH (ref 70–99)
Glucose-Capillary: 185 mg/dL — ABNORMAL HIGH (ref 70–99)
Glucose-Capillary: 71 mg/dL (ref 70–99)
Glucose-Capillary: 155 mg/dL — ABNORMAL HIGH (ref 70–99)
Glucose-Capillary: 250 mg/dL — ABNORMAL HIGH (ref 70–99)

## 2023-09-14 LAB — PHOSPHORUS
Phosphorus: 3.5 mg/dL (ref 2.5–4.6)
Phosphorus: 2.3 mg/dL — ABNORMAL LOW (ref 2.5–4.6)

## 2023-09-14 LAB — TROPONIN I (HIGH SENSITIVITY): Troponin I (High Sensitivity): 103 ng/L (ref <18)

## 2023-09-14 LAB — TRIGLYCERIDES: Triglycerides: 77 mg/dL (ref <150)

## 2023-09-14 LAB — HEPARIN LEVEL (UNFRACTIONATED): Heparin Unfractionated: >1.1 [IU]/mL — ABNORMAL HIGH (ref 0.30–0.70)

## 2023-09-14 MED ORDER — DEXMEDETOMIDINE HCL IN NACL 400 MCG/100ML IV SOLN
INTRAVENOUS | Status: AC
  Filled 2023-09-14: qty 100

## 2023-09-14 MED ORDER — ORAL CARE MOUTH RINSE
15.0000 mL | OROMUCOSAL | Status: DC
  Administered 2023-09-14 – 2023-10-09 (×252): 15 mL via OROMUCOSAL

## 2023-09-14 MED ORDER — DEXMEDETOMIDINE HCL IN NACL 400 MCG/100ML IV SOLN
0.0000 ug/kg/h | INTRAVENOUS | Status: DC
  Administered 2023-09-14: 0.5 ug/kg/h via INTRAVENOUS

## 2023-09-14 MED ORDER — HEPARIN (PORCINE) 25000 UT/250ML-% IV SOLN
1200.0000 [IU]/h | INTRAVENOUS | Status: DC
  Administered 2023-09-14: 900 [IU]/h via INTRAVENOUS
  Administered 2023-09-15: 700 [IU]/h via INTRAVENOUS
  Administered 2023-09-17: 850 [IU]/h via INTRAVENOUS
  Administered 2023-09-17 – 2023-09-18 (×2): 1200 [IU]/h via INTRAVENOUS
  Filled 2023-09-14 (×5): qty 250

## 2023-09-14 MED ORDER — IPRATROPIUM-ALBUTEROL 0.5-2.5 (3) MG/3ML IN SOLN
3.0000 mL | RESPIRATORY_TRACT | Status: DC | PRN
  Administered 2023-09-14: 3 mL via RESPIRATORY_TRACT
  Filled 2023-09-14: qty 3

## 2023-09-14 MED ORDER — ORAL CARE MOUTH RINSE
15.0000 mL | OROMUCOSAL | Status: DC | PRN
Start: 2023-09-14 — End: 2023-10-06

## 2023-09-14 MED ORDER — PROPOFOL 1000 MG/100ML IV EMUL
0.0000 ug/kg/min | INTRAVENOUS | Status: DC
  Administered 2023-09-14: 40 ug/kg/min via INTRAVENOUS
  Administered 2023-09-14: 30 ug/kg/min via INTRAVENOUS
  Administered 2023-09-14 – 2023-09-17 (×11): 40 ug/kg/min via INTRAVENOUS
  Administered 2023-09-18: 35 ug/kg/min via INTRAVENOUS
  Administered 2023-09-18 (×3): 40 ug/kg/min via INTRAVENOUS
  Administered 2023-09-19: 15 ug/kg/min via INTRAVENOUS
  Administered 2023-09-19 – 2023-09-20 (×3): 35 ug/kg/min via INTRAVENOUS
  Administered 2023-09-20: 25 ug/kg/min via INTRAVENOUS
  Administered 2023-09-21 (×2): 45 ug/kg/min via INTRAVENOUS
  Administered 2023-09-22: 5 ug/kg/min via INTRAVENOUS
  Administered 2023-09-23: 30 ug/kg/min via INTRAVENOUS
  Administered 2023-09-23 (×2): 20 ug/kg/min via INTRAVENOUS
  Administered 2023-09-24: 30 ug/kg/min via INTRAVENOUS
  Filled 2023-09-14 (×30): qty 100

## 2023-09-14 NOTE — Consult Note (Signed)
 [facility] Cardiology  CARDIOLOGY CONSULT NOTE  Patient ID: Benjamin Bolton MRN: [id number] DOB/AGE: 1961/05/08 63 y.o.  Admit date: 09/10/2023 Referring Physician [facility] Primary Physician  Primary Cardiologist  Reason for Consultation elevated troponin  HPI: 63 year old gentleman referred for evaluation of elevated troponin.  She has a history of severe COPD who was admitted/04/2024 with acute COPD exacerbation, RSV pneumonia.  Patient initially required BiPAP eventually went emergent intubation.  Admission labs revealed mildly elevated troponin 115, 103.  ECG revealed sinus tachycardia, with minor up-scooping ST segments in inferior leads, not diagnostic for ischemia or STEMI.  Patient on Eliquis  for DVT.  Review of systems complete and found to be negative unless listed above     Past Medical History:  Diagnosis Date   COPD (chronic obstructive pulmonary disease) (HCC)    DVT (deep venous thrombosis) (HCC)    Dyspnea    GERD (gastroesophageal reflux disease)    no meds   HLD (hyperlipidemia)    Hx of migraines    Oxygen dependent    3 L [place]   Pneumonia 2023   Pre-diabetes    Pulmonary nodule    Rotator cuff tear, right    Tobacco abuse    Tobacco use     Past Surgical History:  Procedure Laterality Date   CHEST TUBE INSERTION     SHOULDER ARTHROSCOPY WITH SUBACROMIAL DECOMPRESSION, ROTATOR CUFF REPAIR AND BICEP TENDON REPAIR Right 02/18/2023   Procedure: RIGHT SHOULDER ARTHROSCOPY WITH DEBRIDEMENT, DECOMPRESSION, ROTATOR CUFF REPAIR, BICEPS TENODESIS.;  Surgeon: Edie Norleen PARAS, MD;  Location: [facility] ORS;  Service: Orthopedics;  Laterality: Right;    Medications Prior to Admission  Medication Sig Dispense Refill Last Dose/Taking   albuterol  (PROVENTIL ) (2.5 MG/3ML) 0.083% nebulizer solution USE 1 VIAL IN NEBULIZER EVERY 6 HOURS AS NEEDED FOR WHEEZING   Past Week   albuterol  (VENTOLIN  HFA) 108 (90 Base) MCG/ACT inhaler Inhale 1 puff into the lungs every 4 (four) hours as needed for  wheezing or shortness of breath.   Past Week   APIXABAN  (ELIQUIS ) VTE STARTER PACK (10MG  AND 5MG ) Take as directed on package: start with two-5mg  tablets twice daily for 7 days. On day 8, switch to one-5mg  tablet twice daily. 74 each 0 09/10/2023 at 10:30 AM   ondansetron  (ZOFRAN ) 4 MG tablet Take 1 tablet (4 mg total) by mouth every 6 (six) hours as needed for nausea. 30 tablet 0 Past Week   oxyCODONE  (OXY IR/ROXICODONE ) 5 MG immediate release tablet Take 1 tablet by mouth 2 (two) times daily as needed.   09/09/2023 at  9:45 PM   OXYGEN Inhale 3 L into the lungs continuous.   Taking   predniSONE  (DELTASONE ) 10 MG tablet Take 2-6 tablets by mouth daily with breakfast.  ORIGINAL DPH:ujxz 6 tabs daily x4 days; then 4 tabs daily x4 days; then 2 tabs daily x4 days.   Taking   predniSONE  (DELTASONE ) 20 MG tablet Take 20 mg by mouth daily with breakfast.   09/10/2023 at 10:30 AM   TRELEGY ELLIPTA 100-62.5-25 MCG/ACT AEPB Inhale 1 puff into the lungs every morning.   09/10/2023 at 10:30 AM   amoxicillin-clavulanate (AUGMENTIN) 875-125 MG tablet Take 1 tablet by mouth every 12 (twelve) hours. (Patient not taking: Reported on 09/10/2023)   Not Taking   benralizumab (FASENRA PEN) 30 MG/ML prefilled autoinjector Inject 30 mg into the skin as directed. (Patient not taking: Reported on 09/10/2023)   Not Taking   nystatin (MYCOSTATIN) 100000 UNIT/ML suspension Take 5 mLs by mouth  4 (four) times daily. (Patient not taking: Reported on 09/10/2023)   Not Taking   omeprazole  (PRILOSEC ) 20 MG capsule Take 20 mg by mouth daily. (Patient not taking: Reported on 09/10/2023)   Not Taking   oxyCODONE  (OXY IR/ROXICODONE ) 5 MG immediate release tablet Take 1-2 tablets (5-10 mg total) by mouth every 4 (four) hours as needed for severe pain. 40 tablet 0    Social History   Socioeconomic History   Marital status: Single    Spouse name: Not on file   Number of children: Not on file   Years of education: Not on file   Highest education  level: Not on file  Occupational History   Not on file  Tobacco Use   Smoking status: Some Days    Current packs/day: 0.25    Average packs/day: 0.3 packs/day for 48.0 years (12.0 ttl pk-yrs)    Types: Cigarettes   Smokeless tobacco: Never  Vaping Use   Vaping status: Never Used  Substance and Sexual Activity   Alcohol  use: Yes    Comment: occ   Drug use: Never   Sexual activity: Not on file  Other Topics Concern   Not on file  Social History Narrative   Not on file   Social Drivers of Health   Financial Resource Strain: Low Risk  (08/25/2023)   Received [facility]   Overall Financial Resource Strain (CARDIA)    Difficulty of Paying Living Expenses: Not very hard  Recent Concern: Financial Resource Strain - Medium Risk (07/23/2023)   Received from [facility]   Overall Financial Resource Strain (CARDIA)    Difficulty of Paying Living Expenses: Somewhat hard  Food Insecurity: No Food Insecurity (09/12/2023)   Hunger Vital Sign    Worried About Running Out of Food in the Last Year: Never true    Ran Out of Food in the Last Year: Never true  Recent Concern: Food Insecurity - Food Insecurity Present (08/25/2023)   Received from [facility]   Hunger Vital Sign    Worried About Running Out of Food in the Last Year: Often true    Ran Out of Food in the Last Year: Often true  Transportation Needs: No Transportation Needs (09/12/2023)   PRAPARE - Administrator, Civil Service (Medical): No    Lack of Transportation (Non-Medical): No  Recent Concern: Transportation Needs - Unmet Transportation Needs (08/25/2023)   Received from [facility] - Transportation    In the past 12 months, has lack of transportation kept you from medical appointments or from getting medications?: Yes    Lack of Transportation (Non-Medical): No  Physical Activity: Insufficiently Active (08/25/2023)    Received from [facility]   Exercise Vital Sign    Days of Exercise per Week: 1 day    Minutes of Exercise per Session: 60 min  Stress: No Stress Concern Present (08/25/2023)   Received from [facility] - Occupational Stress Questionnaire    Feeling of Stress : Not at all  Social Connections: Socially Isolated (08/25/2023)   Received from [facility]   Social Connection and Isolation Panel [NHANES]    Frequency of Communication with Friends and Family: Twice a week    Frequency of Social Gatherings with Friends and Family: Never    Attends Religious Services: Never    Database Administrator or  Organizations: No    Attends Banker Meetings: Never    Marital Status: Separated  Intimate Partner Violence: Not At Risk (09/12/2023)   Humiliation, Afraid, Rape, and Kick questionnaire    Fear of Current or Ex-Partner: No    Emotionally Abused: No    Physically Abused: No    Sexually Abused: No    No family history on file.    Review of systems complete and found to be negative unless listed above      PHYSICAL EXAM  General: Well developed, well nourished, in no acute distress HEENT:  Normocephalic and atramatic Neck:  No JVD.  Lungs: Clear bilaterally to auscultation and percussion. Heart: HRRR . Normal S1 and S2 without gallops or murmurs.  Abdomen: Bowel sounds are positive, abdomen soft and non-tender  Msk:  Back normal, normal gait. Normal strength and tone for age. Extremities: No clubbing, cyanosis or edema.   Neuro: Alert and oriented X 3. Psych:  Good affect, responds appropriately  Labs:   Lab Results  Component Value Date   WBC 11.6 (H) 09/14/2023   HGB 11.6 (L) 09/14/2023   HCT 36.4 (L) 09/14/2023   MCV 96.3 09/14/2023   PLT 201 09/14/2023    Recent Labs  Lab 09/13/23 1350 09/14/23 0421  NA 134* 133*  K 4.5 4.2  CL 98 94*  CO2 23 27  BUN 22  26*  CREATININE 0.96 0.88  CALCIUM 8.5* 8.7*  PROT 6.2*  --   BILITOT 0.6  --   ALKPHOS 76  --   ALT 24  --   AST 31  --   GLUCOSE 196* 249*   No results found for: CKTOTAL, CKMB, CKMBINDEX, TROPONINI No results found for: CHOL No results found for: HDL No results found for: [facility] Lab Results  Component Value Date   TRIG 77 09/14/2023   No results found for: CHOLHDL No results found for: LDLDIRECT    Radiology: US  EKG SITE RITE Result Date: 09/13/2023 If Site Rite image not attached, placement could not be confirmed due to current cardiac rhythm.  DG Abd 1 View Result Date: 09/13/2023 CLINICAL DATA:  63 year old male intubated and enteric tube placement. EXAM: ABDOMEN - 1 VIEW COMPARISON:  Portable chest today. FINDINGS: AP supine view 0943 hours. Enteric tube placed into the stomach, side hole the level of the gastric cardia. Negative left lung base. Nonobstructed bowel-gas pattern. IMPRESSION: Satisfactory enteric tube placement into the stomach. Electronically Signed   By: VEAR Hurst M.D.   On: 09/13/2023 10:07   DG Chest Port 1 View Result Date: 09/13/2023 CLINICAL DATA:  63 year old male intubated, enteric tube placement. EXAM: PORTABLE CHEST 1 VIEW COMPARISON:  0904 hours today. FINDINGS: Portable AP upright view at 0944 hours. Intubated now. Endotracheal tube tip in good position over the tracheal air column between the clavicles and carina. Enteric tube courses toward the abdomen. Large lung volumes. Stable ventilation. Stable cardiac size and mediastinal contours. No pneumothorax. Stable visualized osseous structures. IMPRESSION: 1. Satisfactory endotracheal tube. Enteric tube courses to the abdomen. 2. Hyperinflation with stable lung ventilation. Electronically Signed   By: VEAR Hurst M.D.   On: 09/13/2023 10:01   DG Chest Port 1 View Result Date: 09/13/2023 CLINICAL DATA:  Acute hypoxic respiratory failure EXAM: PORTABLE CHEST 1 VIEW COMPARISON:  09/10/2023  FINDINGS: There is hyperinflation of the lungs compatible with COPD. Heart and mediastinal contours are within normal limits. No focal opacities or effusions. No acute bony abnormality. IMPRESSION: COPD.  No active disease. Electronically Signed   By: Franky Crease M.D.   On: 09/13/2023 09:12   DG Chest 2 View Result Date: 09/10/2023 CLINICAL DATA:  Suspected sepsis. EXAM: CHEST - 2 VIEW COMPARISON:  Chest radiograph dated 02/26/2023. FINDINGS: Background of emphysema. No focal consolidation, pleural effusion, or pneumothorax. The cardiac silhouette is within normal limits. No acute osseous pathology. IMPRESSION: 1. No active cardiopulmonary disease. 2. Emphysema. Electronically Signed   By: Vanetta Chou M.D.   On: 09/10/2023 14:19    EKG: Normal sinus rhythm with nondiagnostic up scooping ST segments in inferior leads  ASSESSMENT AND PLAN:   1.  Mildly elevated troponin, 115, 103, most consistent with demand supply ischemia, in the setting of respiratory failure, COPD exacerbation, and RSV pneumonia 2.  Respiratory failure, multifactorial, secondary to COPD exacerbation, and RSV pneumonia, currently intubated  Recommendations  1.  Agree with current therapy 2.  DC heparin  3.  2D echocardiogram 4.  Any further recommendations pending 2D echocardiogram results  Signed: Marsa Dooms MD,PhD, FACC 09/14/2023, 9:10 AM

## 2023-09-14 NOTE — Consult Note (Signed)
 PHARMACY - ANTICOAGULATION CONSULT NOTE  Pharmacy Consult for IV Heparin  Indication:  Hx DVT (apixaban  on hold)  Patient Measurements: Height: 6' (182.9 cm) Weight: 54.8 kg (120 lb 13 oz) IBW/kg (Calculated) : 77.6 Heparin  Dosing Weight: 52.2 kg  Labs: Recent Labs    09/13/23 1148 09/13/23 1350 09/13/23 1725 09/13/23 2059 09/14/23 0131 09/14/23 0421 09/14/23 0924  HGB 13.3  --   --   --   --  11.6*  --   HCT 41.3  --   --   --   --  36.4*  --   PLT 247  --   --   --   --  201  --   APTT 25  --  48*  --  106*  --  89*  LABPROT 14.0  --   --   --   --   --   --   INR 1.1  --   --   --   --   --   --   HEPARINUNFRC  --   --   --   --   --   --  >1.10*  CREATININE  --  0.96  --   --   --  0.88  --   TROPONINIHS  --   --   --  115* 103*  --   --     Estimated Creatinine Clearance: 68.3 mL/min (by C-G formula based on SCr of 0.88 mg/dL).  Medical History: Past Medical History:  Diagnosis Date   COPD (chronic obstructive pulmonary disease) (HCC)    DVT (deep venous thrombosis) (HCC)    Dyspnea    GERD (gastroesophageal reflux disease)    no meds   HLD (hyperlipidemia)    Hx of migraines    Oxygen dependent    3 L [place]   Pneumonia 2023   Pre-diabetes    Pulmonary nodule    Rotator cuff tear, right    Tobacco abuse    Tobacco use     Medications:  Apixaban  5 mg BID, last dose 09/12/23 at 2206  Assessment: 63 y/o M with medical history as above and including hx DVT on apixaban  who is admitted with acute on chronic respiratory failure and now intubated. Pharmacy consulted for IV heparin .  Hgb trending down (13.3 > 11.6), unsure what may have caused this. Continue to monitor trend.    Date/Time aPTT Rate  Comment 1/11 1725 48 800 units/hr Subtherapeutic 1/12 0131 106 950 units/hr Supratherapeutic 1/12 0924 89 (HL > 1.1)   Goal of Therapy:  aPTT 66 - 102 seconds Monitor platelets by anticoagulation protocol: Yes   Plan:  aPTT is therapeutic. Will continue  heparin  infusion at 900 units/hr. Recheck aPTT in 6 hours. HL and CBC with AM labs.   Cathaleen Blanch, PharmD 09/14/2023 10:21 AM

## 2023-09-14 NOTE — Progress Notes (Signed)
*  PRELIMINARY RESULTS* Echocardiogram 2D Echocardiogram has been performed.  Benjamin Bolton 09/14/2023, 4:14 PM

## 2023-09-14 NOTE — Consult Note (Signed)
 PHARMACY - ANTICOAGULATION CONSULT NOTE  Pharmacy Consult for IV Heparin  Indication:  Hx DVT (apixaban  on hold)  Patient Measurements: Height: 6' (182.9 cm) Weight: 54.8 kg (120 lb 13 oz) IBW/kg (Calculated) : 77.6 Heparin  Dosing Weight: 52.2 kg  Labs: Recent Labs    09/13/23 1148 09/13/23 1350 09/13/23 1725 09/13/23 2059 09/14/23 0131 09/14/23 0421 09/14/23 0924 09/14/23 1546 09/14/23 2159  HGB 13.3  --   --   --   --  11.6*  --   --   --   HCT 41.3  --   --   --   --  36.4*  --   --   --   PLT 247  --   --   --   --  201  --   --   --   APTT 25  --    < >  --  106*  --  89* 100* 125*  LABPROT 14.0  --   --   --   --   --   --   --   --   INR 1.1  --   --   --   --   --   --   --   --   HEPARINUNFRC  --   --   --   --   --   --  >1.10*  --   --   CREATININE  --  0.96  --   --   --  0.88  --   --   --   TROPONINIHS  --   --   --  115* 103*  --   --   --   --    < > = values in this interval not displayed.    Estimated Creatinine Clearance: 68.3 mL/min (by C-G formula based on SCr of 0.88 mg/dL).  Medical History: Past Medical History:  Diagnosis Date   COPD (chronic obstructive pulmonary disease) (HCC)    DVT (deep venous thrombosis) (HCC)    Dyspnea    GERD (gastroesophageal reflux disease)    no meds   HLD (hyperlipidemia)    Hx of migraines    Oxygen dependent    3 L [place]   Pneumonia 2023   Pre-diabetes    Pulmonary nodule    Rotator cuff tear, right    Tobacco abuse    Tobacco use     Medications:  Apixaban  5 mg BID, last dose 09/12/23 at 2206  Assessment: 63 y/o M with medical history as above and including hx DVT on apixaban  who is admitted with acute on chronic respiratory failure and now intubated. Pharmacy consulted for IV heparin .  Hgb trending down (13.3 > 11.6), unsure what may have caused this. Continue to monitor trend.    Date/Time aPTT (HL) Rate  Comment 1/11 1725 48  800 units/hr Subtherapeutic 1/12 0131 106  950  units/hr Supratherapeutic 1/12 0924 89 (HL > 1.1)  900 units/hr Therapeutic x 1 1/12 1546 100  900 units/hr Therapeutic x 2 1/12 2159 125  900 units/hr Supratherapeutic  Goal of Therapy:  aPTT 66 - 102 seconds Monitor platelets by anticoagulation protocol: Yes   Plan:  Decrease heparin  infusion to 800 units/hr. Recheck aPTT in w/ AM labs. HL and CBC with AM labs.   Rankin CANDIE Dills, PharmD, MBA 09/14/2023 10:20 PM

## 2023-09-14 NOTE — Consult Note (Signed)
 PHARMACY - ANTICOAGULATION CONSULT NOTE  Pharmacy Consult for IV Heparin  Indication:  Hx DVT (apixaban  on hold)  Patient Measurements: Height: 6' (182.9 cm) Weight: 54.8 kg (120 lb 13 oz) IBW/kg (Calculated) : 77.6 Heparin  Dosing Weight: 52.2 kg  Labs: Recent Labs    09/13/23 1148 09/13/23 1350 09/13/23 1725 09/13/23 2059 09/14/23 0131 09/14/23 0421 09/14/23 0924 09/14/23 1546  HGB 13.3  --   --   --   --  11.6*  --   --   HCT 41.3  --   --   --   --  36.4*  --   --   PLT 247  --   --   --   --  201  --   --   APTT 25  --    < >  --  106*  --  89* 100*  LABPROT 14.0  --   --   --   --   --   --   --   INR 1.1  --   --   --   --   --   --   --   HEPARINUNFRC  --   --   --   --   --   --  >1.10*  --   CREATININE  --  0.96  --   --   --  0.88  --   --   TROPONINIHS  --   --   --  115* 103*  --   --   --    < > = values in this interval not displayed.    Estimated Creatinine Clearance: 68.3 mL/min (by C-G formula based on SCr of 0.88 mg/dL).  Medical History: Past Medical History:  Diagnosis Date   COPD (chronic obstructive pulmonary disease) (HCC)    DVT (deep venous thrombosis) (HCC)    Dyspnea    GERD (gastroesophageal reflux disease)    no meds   HLD (hyperlipidemia)    Hx of migraines    Oxygen dependent    3 L [place]   Pneumonia 2023   Pre-diabetes    Pulmonary nodule    Rotator cuff tear, right    Tobacco abuse    Tobacco use     Medications:  Apixaban  5 mg BID, last dose 09/12/23 at 2206  Assessment: 63 y/o M with medical history as above and including hx DVT on apixaban  who is admitted with acute on chronic respiratory failure and now intubated. Pharmacy consulted for IV heparin .  Hgb trending down (13.3 > 11.6), unsure what may have caused this. Continue to monitor trend.    Date/Time aPTT (HL) Rate  Comment 1/11 1725 48  800 units/hr Subtherapeutic 1/12 0131 106  950 units/hr Supratherapeutic 1/12 0924 89 (HL > 1.1)  900 units/hr Therapeutic x  1 1/12 1546 100  900 units/hr Therapeutic x 2  Goal of Therapy:  aPTT 66 - 102 seconds Monitor platelets by anticoagulation protocol: Yes   Plan:  aPTT is therapeutic x 2. Will continue heparin  infusion at 900 units/hr. Recheck aPTT in 6 hours given aPTT is close to upper limit of goal. HL and CBC with AM labs.   Lum Mania, PharmD Clinical Pharmacist  09/14/2023 4:21 PM

## 2023-09-14 NOTE — Plan of Care (Signed)
  Problem: Health Behavior/Discharge Planning: Goal: Ability to manage health-related needs will improve Outcome: Progressing   Problem: Clinical Measurements: Goal: Ability to maintain clinical measurements within normal limits will improve Outcome: Progressing Goal: Diagnostic test results will improve Outcome: Progressing Goal: Cardiovascular complication will be avoided Outcome: Progressing   Problem: Activity: Goal: Risk for activity intolerance will decrease Outcome: Progressing   Problem: Nutrition: Goal: Adequate nutrition will be maintained Outcome: Progressing   Problem: Elimination: Goal: Will not experience complications related to bowel motility Outcome: Progressing Goal: Will not experience complications related to urinary retention Outcome: Progressing   Problem: Pain Management: Goal: General experience of comfort will improve Outcome: Progressing   Problem: Safety: Goal: Ability to remain free from injury will improve Outcome: Progressing   Problem: Skin Integrity: Goal: Risk for impaired skin integrity will decrease Outcome: Progressing   Problem: Respiratory: Goal: Ability to maintain a clear airway will improve Outcome: Progressing Goal: Levels of oxygenation will improve Outcome: Progressing Goal: Ability to maintain adequate ventilation will improve Outcome: Progressing   Problem: Education: Goal: Knowledge of General Education information will improve Description: Including pain rating scale, medication(s)/side effects and non-pharmacologic comfort measures Outcome: Not Progressing   Problem: Clinical Measurements: Goal: Will remain free from infection Outcome: Not Progressing Goal: Respiratory complications will improve Outcome: Not Progressing   Problem: Coping: Goal: Level of anxiety will decrease Outcome: Not Progressing   Problem: Education: Goal: Knowledge of disease or condition will improve Outcome: Not Progressing Goal:  Knowledge of the prescribed therapeutic regimen will improve Outcome: Not Progressing Goal: Individualized Educational Video(s) Outcome: Not Progressing   Problem: Activity: Goal: Ability to tolerate increased activity will improve Outcome: Not Progressing Goal: Will verbalize the importance of balancing activity with adequate rest periods Outcome: Not Progressing

## 2023-09-14 NOTE — Consult Note (Signed)
 PHARMACY - ANTICOAGULATION CONSULT NOTE  Pharmacy Consult for IV Heparin  Indication:  Hx DVT (apixaban  on hold)  Patient Measurements: Height: 6' (182.9 cm) Weight: 52.2 kg (115 lb 1.3 oz) IBW/kg (Calculated) : 77.6 Heparin  Dosing Weight: 52.2 kg  Labs: Recent Labs    09/11/23 0530 09/13/23 1148 09/13/23 1350 09/13/23 1725 09/13/23 2059 09/14/23 0131  HGB 11.9* 13.3  --   --   --   --   HCT 36.1* 41.3  --   --   --   --   PLT 192 247  --   --   --   --   APTT  --  25  --  48*  --  106*  LABPROT  --  14.0  --   --   --   --   INR  --  1.1  --   --   --   --   CREATININE 0.64  --  0.96  --   --   --   TROPONINIHS  --   --   --   --  115* 103*    Estimated Creatinine Clearance: 59.7 mL/min (by C-G formula based on SCr of 0.96 mg/dL).  Medical History: Past Medical History:  Diagnosis Date   COPD (chronic obstructive pulmonary disease) (HCC)    DVT (deep venous thrombosis) (HCC)    Dyspnea    GERD (gastroesophageal reflux disease)    no meds   HLD (hyperlipidemia)    Hx of migraines    Oxygen dependent    3 L [place]   Pneumonia 2023   Pre-diabetes    Pulmonary nodule    Rotator cuff tear, right    Tobacco abuse    Tobacco use     Medications:  Apixaban  5 mg BID, last dose 09/12/23 at 2206  Assessment: 63 y/o M with medical history as above and including hx DVT on apixaban  who is admitted with acute on chronic respiratory failure and now intubated. Pharmacy consulted for IV heparin .  Baseline aPTT and INR are pending. Check a CBC  Date/Time aPTT Rate  Comment 1/11 1725 48 800 units/hr Subtherapeutic 1/12 0131 106 950 units/hr Supratherapeutic  Goal of Therapy:  aPTT 66 - 102 seconds Monitor platelets by anticoagulation protocol: Yes   Plan:  --Decrease heparin  infusion rate to 900 units/hr --Recheck aPTT 6 hours from rate change.  --Check heparin  level tomorrow AM --Daily CBC per protocol while on IV heparin   Rankin CANDIE Dills, PharmD,  [facility] 09/14/2023 3:02 AM

## 2023-09-14 NOTE — Progress Notes (Signed)
 NAME:  Benjamin Bolton, MRN:  [id number], DOB:  07-Dec-1960, LOS: 4 ADMISSION DATE:  09/10/2023, CHIEF COMPLAINT:  Respiratory Failure   History of Present Illness:   Benjamin Bolton is a 63 year old male with a history of severe COPD followed by [facility] who presents to the hospital from [facility] clinic on 1/8 with increased shortness of breath. He was admitted to the medicine service for management of COPD exacerbation and found to have an RSV infection.   Patient was admitted to [facility] and initiated on nebulizers, steroids, and antibiotics. He was managed medically for COPD exacerbation and UTI but developed worsening respiratory distress this AM requiring the initiation of BiPAP. Rapid response was called and he was transferred to the ICU. Initial blood gas showed pH of 7.2, with repeat VBG showing he remained acidemic at 7.18. Given altered mental status and respiratory failure, decision made at the bedside to proceed with emergent intubation.  Pertinent  Medical History  -DVT on Eliquis  -COPD on 2L O2 -DM  Significant Hospital Events: Including procedures, antibiotic start and stop dates in addition to other pertinent events   1/8: admit to [facility] 1/11: rapid response, transfer to ICU, intubated. Family updated at bedside 1/12: remains vented, sedated, does not follow commands  Interim History / Subjective:  Remains vented and sedated  Objective   Blood pressure 120/82, pulse 92, temperature 98.3 F (36.8 C), temperature source Oral, resp. rate (!) 22, height 6' (1.829 m), weight 54.8 kg, SpO2 100%.    Vent Mode: PRVC FiO2 (%):  [30 %-40 %] 30 % Set Rate:  [22 bmp-25 bmp] 22 bmp Vt Set:  [480 mL-500 mL] 480 mL PEEP:  [5 cmH20] 5 cmH20 Plateau Pressure:  [17 cmH20-21 cmH20] 21 cmH20   Intake/Output Summary (Last 24 hours) at 09/14/2023 0735 Last data filed at 09/14/2023 0600 Gross per 24 hour  Intake 1935.79 ml  Output 775 ml  Net 1160.79 ml   Filed Weights   09/13/23 0854 09/13/23  1240 09/14/23 0430  Weight: 52.2 kg 52.2 kg 54.8 kg    Examination: Physical Exam Constitutional:      Appearance: Normal appearance. He is not ill-appearing.  Cardiovascular:     Rate and Rhythm: Normal rate and regular rhythm.     Heart sounds: Normal heart sounds.  Pulmonary:     Breath sounds: Wheezing present. No rhonchi or rales.  Abdominal:     Palpations: Abdomen is soft.  Musculoskeletal:     Right lower leg: No edema.     Left lower leg: No edema.  Neurological:     Mental Status: He is alert. He is disoriented.      Assessment & Plan:   Neurology #Toxic Metabolic Encephalopathy #CO2 Narcosis   Secondary to severe COPD with underlying chronic hypercapnia. Intubated after failing NIPPV and now initiated on propofol  and fentanyl  for analgosedation.   -Maintain a RASS goal of -1 -Propofol  and Fentanyl  to maintain RASS goal -Daily wake up assessment   Cardiovascular #Sinus Tachycardia   In the setting of severe COPD exacerbation and respiratory failure. Other differentials included pneumothorax (ruled out by CXR) and VTE (history of DVT, but is maintained on apixaban , switched to heparin  gtt). Did have a very mild increase in troponin, EKG non-specific.   -cardiac monitoring   Pulmonary #Acute on Chronic Hypoxic and Hypercapnic Respiratory Failure #COPD Exacerbation #RSV Pneumonia #Community Acquired Pneumonia  Patient with reported severe COPD maintained on oxygen and inhaler therapy outpatient. I do not have  access to his outpatient PFT's but overall presentation and imaging consistent with COPD exacerbation. CXR without focal infiltrates nor pneumothorax. Physical exam notable for expiratory wheeze and decreased breath sounds bilaterally. Presents with AECOPD secondary to RSV infection, with potential for super imposed pneumonia.   Intubated and currently ventilated, blood gas improved following intubation and stable this AM. No Auto-PEEP noted vent this  AM, but continues to have elevated peak pressures with normal plateau pressures, suggesting component of obstruction. Continue to optimize vent settings to allow prolonged exhalation. Anti-coagulated for history of DVT, switched to IV heparin  (from apixaban ). Will continue standing nebulizers and steroids therapy for management of COPD.   -Full vent support, lung protective strategies, decreased Ti, watch for auto-PEEP -Plateau pressures less than 30 cm H20 -Wean FiO2 & PEEP as tolerated to maintain O2 sats >92% -Follow intermittent Chest X-ray & ABG as needed -SBT when improved -Implement VAP Bundle -standing duo-nebs, d/c revefenacin  -methyl-pred to 40 bid -antibiotics as below   Gastrointestinal   PPI for SUP, tube feeds   Renal   Kidney function within normal, continue to monitor electrolytes.   Endocrine   ICU glycemic protocol given history of pre-diabetes and initiation of steroids   Hem/Onc   Switch apixaban  to IV heparin    ID #RSV Infection #Community Acquired Pneumonia   Respiratory viral panel positive for RSV and cultures with multiple organisms. Given worsening respiratory status prudent to continue with broad spectrum antibiotics covering pneumonia. Suspect urine culture is contaminated but nonetheless will be covered with antibiotics.   -switch CTX to zosyn   Best Practice (right click and Reselect all SmartList Selections daily)   Diet/type: tubefeeds DVT prophylaxis systemic heparin  Pressure ulcer(s): N/A GI prophylaxis: PPI Lines: Central line and yes and it is still needed Foley:  Yes, and it is still needed Code Status:  full code Last date of multidisciplinary goals of care discussion [09/14/2023]  Labs   CBC: Recent Labs  Lab 09/10/23 1312 09/11/23 0530 09/13/23 1148 09/14/23 0421  WBC 14.2* 7.6 17.8* 11.6*  NEUTROABS 12.4*  --   --   --   HGB 15.2 11.9* 13.3 11.6*  HCT 46.8 36.1* 41.3 36.4*  MCV 94.7 94.5 97.4 96.3  PLT 242 192 247  201    Basic Metabolic Panel: Recent Labs  Lab 09/10/23 1312 09/11/23 0530 09/13/23 1350 09/13/23 1725 09/14/23 0421  NA 138 140 134*  --  133*  K 3.7 3.9 4.5  --  4.2  CL 101 105 98  --  94*  CO2 24 26 23   --  27  GLUCOSE 162* 170* 196*  --  249*  BUN 12 14 22   --  26*  CREATININE 0.69 0.64 0.96  --  0.88  CALCIUM 9.0 8.3* 8.5*  --  8.7*  MG  --   --   --  2.2 2.4  PHOS  --   --   --  2.5 3.5   GFR: Estimated Creatinine Clearance: 68.3 mL/min (by C-G formula based on SCr of 0.88 mg/dL). Recent Labs  Lab 09/10/23 1312 09/10/23 1315 09/10/23 1535 09/10/23 1823 09/10/23 1939 09/11/23 0530 09/13/23 1148 09/14/23 0421  WBC 14.2*  --   --   --   --  7.6 17.8* 11.6*  LATICACIDVEN  --  1.6 2.7* 1.1 1.8  --   --   --     Liver Function Tests: Recent Labs  Lab 09/10/23 1312 09/13/23 1350  AST 20 31  ALT 20 24  ALKPHOS 79 76  BILITOT 1.0 0.6  PROT 7.1 6.2*  ALBUMIN  3.8 3.4*   No results for input(s): LIPASE, AMYLASE in the last 168 hours. No results for input(s): AMMONIA in the last 168 hours.  ABG    Component Value Date/Time   PHART 7.39 09/13/2023 1036   PCO2ART 47 09/13/2023 1036   PO2ART 126 (H) 09/13/2023 1036   HCO3 32.1 (H) 09/13/2023 1351   O2SAT 88.1 09/13/2023 1351     Coagulation Profile: Recent Labs  Lab 09/10/23 1312 09/13/23 1148  INR 1.2 1.1    Cardiac Enzymes: No results for input(s): CKTOTAL, CKMB, CKMBINDEX, TROPONINI in the last 168 hours.  HbA1C: No results found for: HGBA1C  CBG: Recent Labs  Lab 09/13/23 0857 09/13/23 1631 09/13/23 1957 09/13/23 2320 09/14/23 0428  GLUCAP 263* 192* 175* 117* 229*    Review of Systems:   Unable to obtain  Past Medical History:  He,  has a past medical history of COPD (chronic obstructive pulmonary disease) (HCC), DVT (deep venous thrombosis) (HCC), Dyspnea, GERD (gastroesophageal reflux disease), HLD (hyperlipidemia), migraines, Oxygen dependent, Pneumonia (2023),  Pre-diabetes, Pulmonary nodule, Rotator cuff tear, right, Tobacco abuse, and Tobacco use.   Surgical History:   Past Surgical History:  Procedure Laterality Date   CHEST TUBE INSERTION     SHOULDER ARTHROSCOPY WITH SUBACROMIAL DECOMPRESSION, ROTATOR CUFF REPAIR AND BICEP TENDON REPAIR Right 02/18/2023   Procedure: RIGHT SHOULDER ARTHROSCOPY WITH DEBRIDEMENT, DECOMPRESSION, ROTATOR CUFF REPAIR, BICEPS TENODESIS.;  Surgeon: Edie Norleen PARAS, MD;  Location: [facility] ORS;  Service: Orthopedics;  Laterality: Right;     Social History:   reports that he has been smoking cigarettes. He has a 12 pack-year smoking history. He has never used smokeless tobacco. He reports current alcohol  use. He reports that he does not use drugs.   Family History:  His family history is not on file.   Allergies No Active Allergies   Home Medications  Prior to Admission medications   Medication Sig Start Date End Date Taking? Authorizing Provider  albuterol  (PROVENTIL ) (2.5 MG/3ML) 0.083% nebulizer solution USE 1 VIAL IN NEBULIZER EVERY 6 HOURS AS NEEDED FOR WHEEZING 06/28/21  Yes [provider]  albuterol  (VENTOLIN  HFA) 108 (90 Base) MCG/ACT inhaler Inhale 1 puff into the lungs every 4 (four) hours as needed for wheezing or shortness of breath.   Yes [provider]  APIXABAN  (ELIQUIS ) VTE STARTER PACK (10MG  AND 5MG ) Take as directed on package: start with two-5mg  tablets twice daily for 7 days. On day 8, switch to one-5mg  tablet twice daily. 02/26/23  Yes Bradler, Evan K, MD  ondansetron  (ZOFRAN ) 4 MG tablet Take 1 tablet (4 mg total) by mouth every 6 (six) hours as needed for nausea. 02/19/23  Yes Kip Lynwood Double, PA-C  oxyCODONE  (OXY IR/ROXICODONE ) 5 MG immediate release tablet Take 1 tablet by mouth 2 (two) times daily as needed. 08/29/23  Yes [provider]  OXYGEN Inhale 3 L into the lungs continuous.   Yes [provider]  predniSONE  (DELTASONE ) 10 MG tablet Take 2-6 tablets  by mouth daily with breakfast.  ORIGINAL DPH:ujxz 6 tabs daily x4 days; then 4 tabs daily x4 days; then 2 tabs daily x4 days.   Yes [provider]  predniSONE  (DELTASONE ) 20 MG tablet Take 20 mg by mouth daily with breakfast.   Yes [provider]  TRELEGY ELLIPTA 100-62.5-25 MCG/ACT AEPB Inhale 1 puff into the lungs every morning. 07/04/21  Yes [provider]  amoxicillin-clavulanate (  AUGMENTIN) 875-125 MG tablet Take 1 tablet by mouth every 12 (twelve) hours. Patient not taking: Reported on 09/10/2023    [provider]  benralizumab (FASENRA PEN) 30 MG/ML prefilled autoinjector Inject 30 mg into the skin as directed. Patient not taking: Reported on 09/10/2023 07/23/23   [provider]  nystatin (MYCOSTATIN) 100000 UNIT/ML suspension Take 5 mLs by mouth 4 (four) times daily. Patient not taking: Reported on 09/10/2023 09/04/23   [provider]  omeprazole  (PRILOSEC ) 20 MG capsule Take 20 mg by mouth daily. Patient not taking: Reported on 09/10/2023    [provider]  oxyCODONE  (OXY IR/ROXICODONE ) 5 MG immediate release tablet Take 1-2 tablets (5-10 mg total) by mouth every 4 (four) hours as needed for severe pain. 02/19/23   Kip Lynwood Double, PA-C     Critical care time: 45 minutes    [name], MD [facility] 09/14/2023 8:53 AM

## 2023-09-15 ENCOUNTER — Inpatient Hospital Stay: Payer: 59

## 2023-09-15 DIAGNOSIS — E440 Moderate protein-calorie malnutrition: Secondary | ICD-10-CM | POA: Insufficient documentation

## 2023-09-15 DIAGNOSIS — J441 Chronic obstructive pulmonary disease with (acute) exacerbation: Secondary | ICD-10-CM | POA: Diagnosis not present

## 2023-09-15 LAB — CBC WITH DIFFERENTIAL/PLATELET
Abs Immature Granulocytes: 0.09 10*3/uL — ABNORMAL HIGH (ref 0.00–0.07)
Basophils Absolute: 0 10*3/uL (ref 0.0–0.1)
Basophils Relative: 0 %
Eosinophils Absolute: 0 10*3/uL (ref 0.0–0.5)
Eosinophils Relative: 0 %
HCT: 31.8 % — ABNORMAL LOW (ref 39.0–52.0)
Hemoglobin: 10.8 g/dL — ABNORMAL LOW (ref 13.0–17.0)
Immature Granulocytes: 1 %
Lymphocytes Relative: 9 %
Lymphs Abs: 1.2 10*3/uL (ref 0.7–4.0)
MCH: 31.8 pg (ref 26.0–34.0)
MCHC: 34 g/dL (ref 30.0–36.0)
MCV: 93.5 fL (ref 80.0–100.0)
Monocytes Absolute: 1 10*3/uL (ref 0.1–1.0)
Monocytes Relative: 7 %
Neutro Abs: 11 10*3/uL — ABNORMAL HIGH (ref 1.7–7.7)
Neutrophils Relative %: 83 %
Platelets: 161 10*3/uL (ref 150–400)
RBC: 3.4 MIL/uL — ABNORMAL LOW (ref 4.22–5.81)
RDW: 14.7 % (ref 11.5–15.5)
Smear Review: NORMAL
WBC: 13.3 10*3/uL — ABNORMAL HIGH (ref 4.0–10.5)
nRBC: 0.2 % (ref 0.0–0.2)

## 2023-09-15 LAB — CULTURE, BLOOD (ROUTINE X 2)
Culture: NO GROWTH
Culture: NO GROWTH

## 2023-09-15 LAB — CBC
HCT: 33.4 % — ABNORMAL LOW (ref 39.0–52.0)
Hemoglobin: 10.8 g/dL — ABNORMAL LOW (ref 13.0–17.0)
MCH: 30.6 pg (ref 26.0–34.0)
MCHC: 32.3 g/dL (ref 30.0–36.0)
MCV: 94.6 fL (ref 80.0–100.0)
Platelets: 165 10*3/uL (ref 150–400)
RBC: 3.53 MIL/uL — ABNORMAL LOW (ref 4.22–5.81)
RDW: 14.7 % (ref 11.5–15.5)
WBC: 12 10*3/uL — ABNORMAL HIGH (ref 4.0–10.5)
nRBC: 0.3 % — ABNORMAL HIGH (ref 0.0–0.2)

## 2023-09-15 LAB — BASIC METABOLIC PANEL
Anion gap: 14 (ref 5–15)
BUN: 18 mg/dL (ref 8–23)
CO2: 24 mmol/L (ref 22–32)
Calcium: 8.4 mg/dL — ABNORMAL LOW (ref 8.9–10.3)
Chloride: 92 mmol/L — ABNORMAL LOW (ref 98–111)
Creatinine, Ser: 0.82 mg/dL (ref 0.61–1.24)
GFR, Estimated: >60 mL/min (ref >60)
Glucose, Bld: 69 mg/dL — ABNORMAL LOW (ref 70–99)
Potassium: 4.2 mmol/L (ref 3.5–5.1)
Sodium: 130 mmol/L — ABNORMAL LOW (ref 135–145)

## 2023-09-15 LAB — ECHOCARDIOGRAM COMPLETE
AR max vel: 2.59 cm2
AV Area VTI: 2.59 cm2
AV Area mean vel: 2.27 cm2
AV Mean grad: 2 mm[Hg]
AV Peak grad: 2.9 mm[Hg]
Ao pk vel: 0.85 m/s
Area-P 1/2: 2.84 cm2
Height: 72 in
MV VTI: 1.62 cm2
S' Lateral: 2.5 cm
Weight: 1932.99 [oz_av]

## 2023-09-15 LAB — PHOSPHORUS: Phosphorus: 2.2 mg/dL — ABNORMAL LOW (ref 2.5–4.6)

## 2023-09-15 LAB — GLUCOSE, CAPILLARY
Glucose-Capillary: 147 mg/dL — ABNORMAL HIGH (ref 70–99)
Glucose-Capillary: 269 mg/dL — ABNORMAL HIGH (ref 70–99)
Glucose-Capillary: 270 mg/dL — ABNORMAL HIGH (ref 70–99)
Glucose-Capillary: 296 mg/dL — ABNORMAL HIGH (ref 70–99)
Glucose-Capillary: 314 mg/dL — ABNORMAL HIGH (ref 70–99)
Glucose-Capillary: 356 mg/dL — ABNORMAL HIGH (ref 70–99)
Glucose-Capillary: 38 mg/dL — CL (ref 70–99)
Glucose-Capillary: 42 mg/dL — CL (ref 70–99)
Glucose-Capillary: 483 mg/dL — ABNORMAL HIGH (ref 70–99)
Glucose-Capillary: 79 mg/dL (ref 70–99)

## 2023-09-15 LAB — APTT
aPTT: 115 s — ABNORMAL HIGH (ref 24–36)
aPTT: 79 s — ABNORMAL HIGH (ref 24–36)
aPTT: >200 s (ref 24–36)
aPTT: 108 s — ABNORMAL HIGH (ref 24–36)

## 2023-09-15 LAB — HEPARIN LEVEL (UNFRACTIONATED): Heparin Unfractionated: 1.01 [IU]/mL — ABNORMAL HIGH (ref 0.30–0.70)

## 2023-09-15 LAB — TRIGLYCERIDES: Triglycerides: 66 mg/dL (ref <150)

## 2023-09-15 LAB — BRAIN NATRIURETIC PEPTIDE: B Natriuretic Peptide: 85.7 pg/mL (ref 0.0–100.0)

## 2023-09-15 LAB — MAGNESIUM: Magnesium: 2.2 mg/dL (ref 1.7–2.4)

## 2023-09-15 MED ORDER — BISACODYL 10 MG RE SUPP
10.0000 mg | Freq: Once | RECTAL | Status: AC
  Administered 2023-09-15: 10 mg via RECTAL
  Filled 2023-09-15: qty 1

## 2023-09-15 MED ORDER — VITAL 1.5 CAL PO LIQD
1000.0000 mL | ORAL | Status: DC
  Administered 2023-09-15 – 2023-09-17 (×3): 1000 mL

## 2023-09-15 MED ORDER — INSULIN ASPART 100 UNIT/ML IJ SOLN: 0.0000 [IU] | INTRAMUSCULAR | Status: DC

## 2023-09-15 MED ORDER — METHYLPREDNISOLONE SODIUM SUCC 40 MG IJ SOLR
40.0000 mg | Freq: Every day | INTRAMUSCULAR | Status: DC
  Administered 2023-09-15 – 2023-09-17 (×3): 40 mg via INTRAVENOUS
  Filled 2023-09-15 (×3): qty 1

## 2023-09-15 MED ORDER — INSULIN ASPART 100 UNIT/ML IJ SOLN
0.0000 [IU] | INTRAMUSCULAR | Status: DC
  Administered 2023-09-15: 8 [IU] via SUBCUTANEOUS
  Administered 2023-09-15: 11 [IU] via SUBCUTANEOUS
  Filled 2023-09-15 (×2): qty 1

## 2023-09-15 MED ORDER — INSULIN ASPART 100 UNIT/ML IJ SOLN
0.0000 [IU] | INTRAMUSCULAR | Status: DC
  Administered 2023-09-15: 11 [IU] via SUBCUTANEOUS
  Administered 2023-09-16 (×2): 7 [IU] via SUBCUTANEOUS
  Administered 2023-09-16: 3 [IU] via SUBCUTANEOUS
  Administered 2023-09-16 – 2023-09-17 (×5): 7 [IU] via SUBCUTANEOUS
  Administered 2023-09-17: 4 [IU] via SUBCUTANEOUS
  Administered 2023-09-17: 7 [IU] via SUBCUTANEOUS
  Administered 2023-09-18: 3 [IU] via SUBCUTANEOUS
  Administered 2023-09-18 – 2023-09-20 (×5): 4 [IU] via SUBCUTANEOUS
  Administered 2023-09-20 – 2023-09-21 (×5): 3 [IU] via SUBCUTANEOUS
  Administered 2023-09-21 (×3): 4 [IU] via SUBCUTANEOUS
  Administered 2023-09-22: 3 [IU] via SUBCUTANEOUS
  Administered 2023-09-22: 7 [IU] via SUBCUTANEOUS
  Administered 2023-09-22: 3 [IU] via SUBCUTANEOUS
  Administered 2023-09-22: 7 [IU] via SUBCUTANEOUS
  Administered 2023-09-22: 4 [IU] via SUBCUTANEOUS
  Administered 2023-09-23: 7 [IU] via SUBCUTANEOUS
  Administered 2023-09-23: 4 [IU] via SUBCUTANEOUS
  Administered 2023-09-23: 7 [IU] via SUBCUTANEOUS
  Administered 2023-09-23: 3 [IU] via SUBCUTANEOUS
  Administered 2023-09-23 – 2023-09-24 (×3): 4 [IU] via SUBCUTANEOUS
  Administered 2023-09-24 – 2023-09-25 (×3): 3 [IU] via SUBCUTANEOUS
  Administered 2023-09-25 (×2): 4 [IU] via SUBCUTANEOUS
  Administered 2023-09-25 – 2023-09-26 (×2): 3 [IU] via SUBCUTANEOUS
  Administered 2023-09-26 – 2023-09-27 (×6): 4 [IU] via SUBCUTANEOUS
  Administered 2023-09-27: 7 [IU] via SUBCUTANEOUS
  Administered 2023-09-27: 4 [IU] via SUBCUTANEOUS
  Administered 2023-09-28: 7 [IU] via SUBCUTANEOUS
  Administered 2023-09-28: 4 [IU] via SUBCUTANEOUS
  Administered 2023-09-28: 7 [IU] via SUBCUTANEOUS
  Administered 2023-09-28: 4 [IU] via SUBCUTANEOUS
  Administered 2023-09-28: 7 [IU] via SUBCUTANEOUS
  Administered 2023-09-28: 3 [IU] via SUBCUTANEOUS
  Administered 2023-09-28: 7 [IU] via SUBCUTANEOUS
  Administered 2023-09-29: 3 [IU] via SUBCUTANEOUS
  Administered 2023-09-29: 4 [IU] via SUBCUTANEOUS
  Filled 2023-09-15 (×60): qty 1

## 2023-09-15 MED ORDER — DEXTROSE 50 % IV SOLN
25.0000 g | INTRAVENOUS | Status: AC
  Administered 2023-09-15: 25 g via INTRAVENOUS
  Filled 2023-09-15: qty 50

## 2023-09-15 MED ORDER — NOREPINEPHRINE 16 MG/250ML-% IV SOLN
0.0000 ug/min | INTRAVENOUS | Status: DC
  Administered 2023-09-15: 14 ug/min via INTRAVENOUS
  Administered 2023-09-20: 2 ug/min via INTRAVENOUS
  Administered 2023-09-20 – 2023-09-21 (×2): 4 ug/min via INTRAVENOUS
  Administered 2023-09-24: 2 ug/min via INTRAVENOUS
  Administered 2023-09-25: 20 ug/min via INTRAVENOUS
  Administered 2023-09-26: 8 ug/min via INTRAVENOUS
  Administered 2023-09-26: 2 ug/min via INTRAVENOUS
  Filled 2023-09-15: qty 500
  Filled 2023-09-15 (×5): qty 250

## 2023-09-15 MED ORDER — DIAZEPAM 5 MG PO TABS
5.0000 mg | ORAL_TABLET | Freq: Two times a day (BID) | ORAL | Status: DC
  Administered 2023-09-15 – 2023-09-19 (×9): 5 mg
  Filled 2023-09-15 (×9): qty 1

## 2023-09-15 MED ORDER — DIAZEPAM 5 MG PO TABS: 5.0000 mg | ORAL_TABLET | Freq: Three times a day (TID) | ORAL | Status: DC

## 2023-09-15 MED ORDER — INSULIN ASPART 100 UNIT/ML IJ SOLN
0.0000 [IU] | INTRAMUSCULAR | Status: DC
  Administered 2023-09-15: 20 [IU] via SUBCUTANEOUS
  Filled 2023-09-15: qty 1

## 2023-09-15 MED ORDER — FAMOTIDINE 20 MG PO TABS
20.0000 mg | ORAL_TABLET | Freq: Two times a day (BID) | ORAL | Status: DC
  Administered 2023-09-15 – 2023-09-29 (×28): 20 mg
  Filled 2023-09-15 (×28): qty 1

## 2023-09-15 NOTE — Progress Notes (Signed)
 [facility] CLINIC CARDIOLOGY PROGRESS NOTE   Patient ID: Benjamin Bolton MRN: [id number] DOB/AGE: 62-04-1961 62 y.o.  Admit date: 09/10/2023 Referring Physician Dr. Isadora Primary Physician [facility]  Primary Cardiologist None Reason for Consultation elevated troponin  HPI: [name] is a 63 y.o. male with a past medical history of severe COPD, hyperlipidemia, hx DVT on Eliquis  who presented to the ED on 09/10/2023 for shortness of breath. Troponins checked and found to be mildly elevated. Cardiology was consulted for further evaluation.   Interval History:  -Patient seen and examined this AM, remains intubated and sedated.  -Remains on levophed  for BP support. HR is controlled.   Review of systems complete and found to be negative unless listed above    Vitals:   09/15/23 1032 09/15/23 1045 09/15/23 1110 09/15/23 1115  BP:  (!) 73/60  90/77  Pulse:  (!) 114  (!) 110  Resp:  20  19  Temp:    98.3 F (36.8 C)  TempSrc:    Axillary  SpO2: 94% 92% 94% 93%  Weight:      Height:         Intake/Output Summary (Last 24 hours) at 09/15/2023 1208 Last data filed at 09/15/2023 0944 Gross per 24 hour  Intake 2082.65 ml  Output 1430 ml  Net 652.65 ml     PHYSICAL EXAM General: Ill appearing male, well nourished, in no acute distress. HEENT: Normocephalic and atraumatic. Neck: No JVD.  Lungs: Intubated, mechanical breath sounds.  Heart: HRRR. Normal S1 and S2 without gallops or murmurs. Radial & DP pulses 2+ bilaterally. Abdomen: Non-distended appearing.  Msk: Normal strength and tone for age. Extremities: No clubbing, cyanosis or edema.   Neuro: Sedated.   LABS: Basic Metabolic Panel: Recent Labs    09/14/23 0421 09/14/23 1552 09/15/23 0439 09/15/23 0833  NA 133*  --   --  130*  K 4.2  --   --  4.2  CL 94*  --   --  92*  CO2 27  --   --  24  GLUCOSE 249*  --   --  69*  BUN 26*  --   --  18  CREATININE 0.88  --   --  0.82  CALCIUM 8.7*  --   --  8.4*   MG 2.4 2.2 2.2  --   PHOS 3.5 2.3* 2.2*  --    Liver Function Tests: Recent Labs    09/13/23 1350  AST 31  ALT 24  ALKPHOS 76  BILITOT 0.6  PROT 6.2*  ALBUMIN  3.4*   No results for input(s): LIPASE, AMYLASE in the last 72 hours. CBC: Recent Labs    09/15/23 0439 09/15/23 0833  WBC 12.0* 13.3*  NEUTROABS  --  11.0*  HGB 10.8* 10.8*  HCT 33.4* 31.8*  MCV 94.6 93.5  PLT 165 161   Cardiac Enzymes: Recent Labs    09/13/23 2059 09/14/23 0131  TROPONINIHS 115* 103*   BNP: Recent Labs    09/15/23 0833  BNP 85.7   D-Dimer: No results for input(s): DDIMER in the last 72 hours. Hemoglobin A1C: No results for input(s): HGBA1C in the last 72 hours. Fasting Lipid Panel: Recent Labs    09/15/23 0439  TRIG 66   Thyroid Function Tests: No results for input(s): TSH, T4TOTAL, T3FREE, THYROIDAB in the last 72 hours.  Invalid input(s): FREET3 Anemia Panel: No results for input(s): VITAMINB12, FOLATE, FERRITIN, TIBC, IRON , RETICCTPCT in the last 72 hours.  ECHOCARDIOGRAM COMPLETE Result  Date: 09/15/2023    ECHOCARDIOGRAM REPORT   Patient Name:   Benjamin Bolton Date of Exam: 09/14/2023 Medical Rec #:  [id number]      Height:       72.0 in Accession #:    [id number]     Weight:       120.8 lb Date of Birth:  [birth year]      BSA:          1.720 m Patient Age:    62 years       BP:           98/71 mmHg Patient Gender: M              HR:           110 bpm. Exam Location:  [facility] Procedure: 2D Echo, Cardiac Doppler and Color Doppler Indications:     Elevated Troponin  History:         Patient has no prior history of Echocardiogram examinations.                  COPD; Risk Factors:Dyslipidemia and Current Smoker.  Sonographer:     Naomie Reef Referring Phys:  Marsa Dooms MD Diagnosing Phys: Keller Paterson  Sonographer Comments: Technically difficult study due to poor echo windows and echo performed with patient supine and on artificial respirator.  Image acquisition challenging due to COPD. IMPRESSIONS  1. Technically difficult study. Definity not used. Patient tachycardic at time of evaluation.  2. Left ventricular ejection fraction, by estimation, is 50 to 55%. The left ventricle has low normal function. Left ventricular diastolic parameters are indeterminate.  3. Right ventricular systolic function was not well visualized. The right ventricular size is not well visualized.  4. The mitral valve is normal in structure. No evidence of mitral valve regurgitation.  5. The aortic valve was not well visualized. Aortic valve regurgitation is not visualized.  6. The inferior vena cava is dilated in size with <50% respiratory variability, suggesting right atrial pressure of 15 mmHg. FINDINGS  Left Ventricle: Left ventricular ejection fraction, by estimation, is 50 to 55%. The left ventricle has low normal function. The left ventricular internal cavity size was normal in size. There is no left ventricular hypertrophy. Left ventricular diastolic parameters are indeterminate.  LV Wall Scoring: The mid and distal anterior wall, mid and distal lateral wall, mid and distal anterior septum, entire apex, entire inferior wall, posterior wall, and mid anterolateral segment are normal. Right Ventricle: The right ventricular size is not well visualized. Right vetricular wall thickness was not well visualized. Right ventricular systolic function was not well visualized. The tricuspid regurgitant velocity is 1.32 m/s, and with an assumed right atrial pressure of 8 mmHg, the estimated right ventricular systolic pressure is 15.0 mmHg. Left Atrium: Left atrial size was normal in size. Right Atrium: Right atrial size was normal in size. Pericardium: There is no evidence of pericardial effusion. Mitral Valve: The mitral valve is normal in structure. No evidence of mitral valve regurgitation. MV peak gradient, 2.7 mmHg. The mean mitral valve gradient is 1.0 mmHg. Tricuspid Valve: The  tricuspid valve is normal in structure. Tricuspid valve regurgitation is trivial. Aortic Valve: The aortic valve was not well visualized. Aortic valve regurgitation is not visualized. Aortic valve mean gradient measures 2.0 mmHg. Aortic valve peak gradient measures 2.9 mmHg. Aortic valve area, by VTI measures 2.59 cm. Pulmonic Valve: The pulmonic valve was not well visualized. Pulmonic valve regurgitation is not visualized. Aorta: The  aortic root is normal in size and structure. Venous: The inferior vena cava is dilated in size with less than 50% respiratory variability, suggesting right atrial pressure of 15 mmHg. IAS/Shunts: The interatrial septum was not well visualized.  LEFT VENTRICLE PLAX 2D LVIDd:         3.40 cm   Diastology LVIDs:         2.50 cm   LV e' medial:    8.59 cm/s LV PW:         1.00 cm   LV E/e' medial:  7.7 LV IVS:        0.90 cm   LV e' lateral:   5.44 cm/s LVOT diam:     1.80 cm   LV E/e' lateral: 12.1 LV SV:         32 LV SV Index:   18 LVOT Area:     2.54 cm  LEFT ATRIUM           Index       RIGHT ATRIUM          Index LA diam:      1.60 cm 0.93 cm/m  RA Area:     6.86 cm LA Vol (A4C): 6.3 ml  3.66 ml/m  RA Volume:   11.60 ml 6.75 ml/m  AORTIC VALVE                    PULMONIC VALVE AV Area (Vmax):    2.59 cm     PV Vmax:       0.69 m/s AV Area (Vmean):   2.27 cm     PV Peak grad:  1.9 mmHg AV Area (VTI):     2.59 cm AV Vmax:           85.20 cm/s AV Vmean:          55.700 cm/s AV VTI:            0.123 m AV Peak Grad:      2.9 mmHg AV Mean Grad:      2.0 mmHg LVOT Vmax:         86.80 cm/s LVOT Vmean:        49.600 cm/s LVOT VTI:          0.125 m LVOT/AV VTI ratio: 1.02  AORTA Ao Root diam: 3.40 cm MITRAL VALVE               TRICUSPID VALVE MV Area (PHT): 2.84 cm    TR Peak grad:   7.0 mmHg MV Area VTI:   1.62 cm    TR Vmax:        132.00 cm/s MV Peak grad:  2.7 mmHg MV Mean grad:  1.0 mmHg    SHUNTS MV Vmax:       0.83 m/s    Systemic VTI:  0.12 m MV Vmean:      50.7 cm/s    Systemic Diam: 1.80 cm MV Decel Time: 267 msec MV E velocity: 65.80 cm/s MV A velocity: 59.60 cm/s MV E/A ratio:  1.10 Keller Paterson Electronically signed by Keller Paterson Signature Date/Time: 09/15/2023/11:00:13 AM    Final    DG Chest Port 1 View Result Date: 09/15/2023 CLINICAL DATA:  63 year old male with respiratory failure. EXAM: PORTABLE CHEST 1 VIEW COMPARISON:  Portable chest yesterday and earlier. FINDINGS: Portable AP semi upright view at 0815 hours. Hyperinflated lungs. Stable lines and tubes. Coarse reticulonodular opacity about the left hilum and in  the left lower lung has developed since 07/13/2024 and appears progressed from yesterday. No pneumothorax, pleural effusion or other acute lung opacity. Negative visible bowel gas. Stable visualized osseous structures. IMPRESSION: 1. Stable and satisfactory lines and tubes. 2. Evidence of chronic pulmonary hyperinflation. Multilobar left lung bronchopneumonia has developed since 09/13/2023. No pleural effusion is evident. Electronically Signed   By: VEAR Hurst M.D.   On: 09/15/2023 08:57   DG Chest Port 1 View Result Date: 09/14/2023 CLINICAL DATA:  [id number] Respiratory failure with hypoxia (HCC) [id number] EXAM: PORTABLE CHEST 1 VIEW COMPARISON:  09/13/2023 FINDINGS: Interval placement of a right sided PICC line with distal tip terminating at the level of the distal SVC. Endotracheal and enteric tubes remain appropriately positioned. Normal heart size. Aortic atherosclerosis. Hyperinflated lungs. Slightly increased bibasilar opacities. No pneumothorax. IMPRESSION: 1. Interval placement of a right-sided PICC line with distal tip terminating at the level of the distal SVC. 2. Slightly increased bibasilar opacities, which may represent atelectasis, aspiration, or pneumonia. Electronically Signed   By: Mabel Converse D.O.   On: 09/14/2023 09:52   US  EKG SITE RITE Result Date: 09/13/2023 If Site Rite image not attached, placement could not be confirmed due to  current cardiac rhythm.    ECHO as above  TELEMETRY reviewed by me 09/15/23: sinus tachycardia rate 100-110s  EKG reviewed by me 09/15/23: ST 131 bpm  DATA reviewed by me 09/15/23: last 24h vitals tele labs imaging I/O, PCCM note  Principal Problem:   COPD exacerbation (HCC) Active Problems:   DVT (deep venous thrombosis) (HCC)   Protein-calorie malnutrition, severe (HCC)   Tobacco abuse   Acute on chronic respiratory failure with hypoxia (HCC)   HLD (hyperlipidemia)   UTI (urinary tract infection)    ASSESSMENT AND PLAN: Oleg Oleson is a 63 y.o. male with a past medical history of severe COPD, hyperlipidemia, hx DVT on Eliquis  who presented to the ED on 09/10/2023 for shortness of breath. Troponins checked and found to be mildly elevated. Cardiology was consulted for further evaluation.   # Demand ischemia # Acute hypoxic respiratory failure # COPD exacerbation # RSV infection Patient presented with SOB, respiratory failure. Found to be RSV postive, concern for COPD exacerbation. Required intubation due to declining respiratory status. Troponins 115 > 103. No acute changes on EKG. Echo this admission with EF 50-55%, no WMAs.  -Further management per PCCM.  -Mildly elevated and flat trending troponins most consistent with demand/supply mismatch and not ACS in the setting of acute respiratory failure.   Cardiology will sign off. Please haiku with questions or re-engage if needed.    This patient's case was discussed and created with Dr. Wilburn and he is in agreement.  Signed:  Danita Bloch, PA-C  09/15/2023, 12:08 PM [facility]

## 2023-09-15 NOTE — Progress Notes (Signed)
 Updated pts sister Rock via telephone regarding pts condition and current plan of care.  All questions were answered.  Will continue to monitor and assess pt   Lonell Moose, [facility]  Pulmonary/Critical Care Pager [phone] (please enter 7 digits) PCCM Consult Pager [phone] (please enter 7 digits)

## 2023-09-15 NOTE — Consult Note (Signed)
 PHARMACY - ANTICOAGULATION CONSULT NOTE  Pharmacy Consult for IV Heparin  Indication:  Hx DVT (apixaban  on hold)  Patient Measurements: Height: 6' (182.9 cm) Weight: 54.8 kg (120 lb 13 oz) IBW/kg (Calculated) : 77.6 Heparin  Dosing Weight: 52.2 kg  Labs: Recent Labs    09/13/23 1148 09/13/23 1350 09/13/23 1725 09/13/23 2059 09/14/23 0131 09/14/23 0421 09/14/23 0924 09/14/23 1546 09/14/23 2159 09/15/23 0439  HGB 13.3  --   --   --   --  11.6*  --   --   --  10.8*  HCT 41.3  --   --   --   --  36.4*  --   --   --  33.4*  PLT 247  --   --   --   --  201  --   --   --  165  APTT 25  --    < >  --  106*  --  89* 100* 125* 115*  LABPROT 14.0  --   --   --   --   --   --   --   --   --   INR 1.1  --   --   --   --   --   --   --   --   --   HEPARINUNFRC  --   --   --   --   --   --  >1.10*  --   --  1.01*  CREATININE  --  0.96  --   --   --  0.88  --   --   --   --   TROPONINIHS  --   --   --  115* 103*  --   --   --   --   --    < > = values in this interval not displayed.    Estimated Creatinine Clearance: 68.3 mL/min (by C-G formula based on SCr of 0.88 mg/dL).  Medical History: Past Medical History:  Diagnosis Date   COPD (chronic obstructive pulmonary disease) (HCC)    DVT (deep venous thrombosis) (HCC)    Dyspnea    GERD (gastroesophageal reflux disease)    no meds   HLD (hyperlipidemia)    Hx of migraines    Oxygen dependent    3 L [place]   Pneumonia 2023   Pre-diabetes    Pulmonary nodule    Rotator cuff tear, right    Tobacco abuse    Tobacco use     Medications:  Apixaban  5 mg BID, last dose 09/12/23 at 2206  Assessment: 63 y/o M with medical history as above and including hx DVT on apixaban  who is admitted with acute on chronic respiratory failure and now intubated. Pharmacy consulted for IV heparin .  Hgb trending down (13.3 > 11.6), unsure what may have caused this. Continue to monitor trend.    Date/Time aPTT (HL) Rate  Comment 1/11 1725 48  800  units/hr Subtherapeutic 1/12 0131 106  950 units/hr Supratherapeutic 1/12 0924 89 (HL > 1.1)  900 units/hr Therapeutic x 1 1/12 1546 100  900 units/hr Therapeutic x 2 1/12 2159 125  900 units/hr Supratherapeutic 1/13 0439 115 / 1.01 800 units/hr Supratherapeutic  Goal of Therapy:  aPTT 66 - 102 seconds Monitor platelets by anticoagulation protocol: Yes   Plan:  Decrease heparin  infusion to 700 units/hr. Recheck aPTT in 6 hrs after rate change. HL and CBC with AM labs.   Rankin CANDIE Dills,  PharmD, [facility] 09/15/2023 5:55 AM

## 2023-09-15 NOTE — Consult Note (Signed)
 PHARMACY - ANTICOAGULATION CONSULT NOTE  Pharmacy Consult for IV Heparin  Indication:  Hx DVT (apixaban  on hold)  Patient Measurements: Height: 6' (182.9 cm) Weight: 54.8 kg (120 lb 13 oz) IBW/kg (Calculated) : 77.6 Heparin  Dosing Weight: 52.2 kg  Labs: Recent Labs    09/13/23 1148 09/13/23 1350 09/13/23 1725 09/13/23 2059 09/14/23 0131 09/14/23 0421 09/14/23 0924 09/14/23 1546 09/15/23 0439 09/15/23 0833 09/15/23 1242 09/15/23 1829 09/15/23 2025  HGB 13.3  --   --   --   --  11.6*  --   --  10.8* 10.8*  --   --   --   HCT 41.3  --   --   --   --  36.4*  --   --  33.4* 31.8*  --   --   --   PLT 247  --   --   --   --  201  --   --  165 161  --   --   --   APTT 25  --    < >  --  106*  --  89*   < > 115*  --  79* >200* 108*  LABPROT 14.0  --   --   --   --   --   --   --   --   --   --   --   --   INR 1.1  --   --   --   --   --   --   --   --   --   --   --   --   HEPARINUNFRC  --   --   --   --   --   --  >1.10*  --  1.01*  --   --   --   --   CREATININE  --  0.96  --   --   --  0.88  --   --   --  0.82  --   --   --   TROPONINIHS  --   --   --  115* 103*  --   --   --   --   --   --   --   --    < > = values in this interval not displayed.    Estimated Creatinine Clearance: 73.3 mL/min (by C-G formula based on SCr of 0.82 mg/dL).  Medical History: Past Medical History:  Diagnosis Date   COPD (chronic obstructive pulmonary disease) (HCC)    DVT (deep venous thrombosis) (HCC)    Dyspnea    GERD (gastroesophageal reflux disease)    no meds   HLD (hyperlipidemia)    Hx of migraines    Oxygen dependent    3 L [place]   Pneumonia 2023   Pre-diabetes    Pulmonary nodule    Rotator cuff tear, right    Tobacco abuse    Tobacco use     Medications:  Apixaban  5 mg BID, last dose 09/12/23 at 2206  Assessment: 63 y/o M with medical history as above and including hx DVT on apixaban  who is admitted with acute on chronic respiratory failure and now intubated. Pharmacy  consulted for IV heparin .  Hgb trending down (13.3 > 11.6), unsure what may have caused this. Continue to monitor trend.    Date/Time aPTT (HL) Rate  Comment 1/11 1725 48  800 units/hr Subtherapeutic 1/12 0131 106  950 units/hr Supratherapeutic 1/12 0924 89 (  HL > 1.1)  900 units/hr Therapeutic x 1 1/12 1546 100  900 units/hr Therapeutic x 2 1/12 2159 125  900 units/hr Supratherapeutic 1/13 0439 115 / 1.01 800 units/hr Supratherapeutic 1/13 1242 79 / --  700 units/hr Therapeutic x 1 1/13 2025 108 / --  700 units/hr Supratherapeutic  Per RN, no issues with infusion, no signs/symptoms of bleeding.  Goal of Therapy:  aPTT 66 - 102 seconds Monitor platelets by anticoagulation protocol: Yes   Plan:  Decrease heparin  infusion to 650 units/hr.  Check aPTT in 6 hrs. HL and CBC with AM labs.   Thank you for involving pharmacy in this patient's care.   Damien Napoleon, PharmD Clinical Pharmacist 09/15/2023 9:04 PM

## 2023-09-15 NOTE — Progress Notes (Signed)
 Updated pts son Gerrit Rafalski via telephone regarding pts condition and plan of care.  Mr. Schicker was appreciative to receive an update.  Will continue to monitor and assess pt.  Lonell Moose, AGNP  Pulmonary/Critical Care Pager [phone] (please enter 7 digits) PCCM Consult Pager [phone] (please enter 7 digits)

## 2023-09-15 NOTE — Progress Notes (Signed)
 Brief Progress Note   Pt failed SBT developed severe tachypnea with accessory muscle use and became hypoxic SPO2 59%.  Pt placed back on full support and sedation restarted.  Will continue to monitor and assess pt.   Lonell Moose, AGNP  Pulmonary/Critical Care Pager [phone] (please enter 7 digits) PCCM Consult Pager [phone] (please enter 7 digits)

## 2023-09-15 NOTE — Progress Notes (Addendum)
 NAME:  Benjamin Bolton, MRN:  [id number], DOB:  1961/05/10, LOS: 5 ADMISSION DATE:  09/10/2023, CONSULTATION DATE: 09/13/2023  History of Present Illness:  Benjamin Bolton is a 63 year old male with a history of severe COPD followed by [facility] who presents to the hospital from [facility] clinic on 1/8 with increased shortness of breath. He was admitted to the medicine service for management of COPD exacerbation and found to have an RSV infection.   Patient was admitted to [facility] and initiated on nebulizers, steroids, and antibiotics. He was managed medically for COPD exacerbation and UTI but developed worsening respiratory distress this AM requiring the initiation of BiPAP. Rapid response was called and he was transferred to the ICU. Initial blood gas showed pH of 7.2, with repeat VBG showing he remained acidemic at 7.18. Given altered mental status and respiratory failure, decision made at the bedside to proceed with emergent intubation.  Pertinent  Medical History  DVT on Eliquis  COPD on 2L O2 Type II Diabetes Mellitus  GERD HLD Pulmonary Nodule Tobacco Abuse   Significant Hospital Events: Including procedures, antibiotic start and stop dates in addition to other pertinent events   1/08: admit to [facility] 1/11: rapid response, transfer to ICU, intubated 1/12: remains vented, sedated, does not follow commands.  Failed SBT  1/13: Overnight due to concerns of aspiration TF's held.  Will repeat SBT today   Interim History / Subjective:  Pt sedated on propofol  and fentanyl  gtts.  Currently requiring levophed  gtt @14  mcg/min to maintain map 65 or higher   Objective   Blood pressure 95/74, pulse (!) 103, temperature 98.9 F (37.2 C), temperature source Axillary, resp. rate (!) 22, height 6' (1.829 m), weight 54.8 kg, SpO2 98%.    Vent Mode: PRVC FiO2 (%):  [30 %] 30 % Set Rate:  [22 bmp] 22 bmp Vt Set:  [480 mL] 480 mL PEEP:  [5 cmH20] 5 cmH20 Plateau Pressure:  [20 cmH20-23 cmH20] 20 cmH20    Intake/Output Summary (Last 24 hours) at 09/15/2023 0750 Last data filed at 09/15/2023 0645 Gross per 24 hour  Intake 2639.23 ml  Output 1230 ml  Net 1409.23 ml   Filed Weights   09/13/23 0854 09/13/23 1240 09/14/23 0430  Weight: 52.2 kg 52.2 kg 54.8 kg    Examination: General: Acutely-ill appearing male, NAD mechanically intubated HENT: Supple, no JVD  Lungs: Rhonchi with faint expiratory wheezes throughout, even, non labored  Cardiovascular: Sinus tachycardia, no m/r/g, 2+ radial/2+ distal, no edema  Abdomen: +BS x4, slightly distended, taut   Extremities: Normal bulk and tone Neuro: Sedated, not following commands, PERRL GU: Indwelling foley catheter draining yellow urine with sediment   Resolved Hospital Problem list     Assessment & Plan:   #Acute toxic metabolic encephalopathy  #Mechanically intubation pain/discomfort  - Correct metabolic derangements  - Avoid sedating medications as able  - If unable to extubate today will start seroquel  per tube for anxiety - Maintain RASS goal 0 to -1 - PAD protocol to maintain RASS goal: Prn fentanyl  and propofol  gtts; will start precedex  gtt and attempt to wean propofol  gtt for SBT  - WUA daily - Continue thiamine  and mvi   #Mildly elevated troponin secondary to demand ischemia  #Hypotension suspect secondary to sedating medication Hx: HLD and DVT on Eliquis  - Continuous telemetry monitoring  - Troponin peaked at 115 - Echo results pending  - Prn levophed  gtt to maintain map 65 or higher  - Continue heparin  gtt: dosing per  pharmacy   #Acute respiratory failure #AECOPD   #Pneumonia  #RSV #Mechanical intubation  - Full vent support for now: vent settings reviewed and established  - Continue lung protective strategies  - Maintain plateau pressures 30 cm H2O - SBT once all parameters met  - Scheduled and prn bronchodilator therapy  - IV steroids: wean as tolerated - Intermittent CXR and ABG;s  #Hyponatremia   #Hypophosphatemia  - Trend BMP  - Replace electrolytes as indicated  - Strict I&O's   #Anemia without obvious signs of acute blood loss  - Trend CBC  - Monitor for s/sx of bleeding  - Transfuse for hgb <7   #Type II diabetes mellitus  #Hypoglycemia - CBG's q4hrs  - Will discontinue resistant SSI and start sensitive SSI if TF's restarted  - Follow hypo/hyperglycemic protocol  - Target CBG's 140 to 180   Best Practice (right click and Reselect all SmartList Selections daily)   Diet/type: If pt not extubated will start trickle feeds  DVT prophylaxis systemic heparin  Pressure ulcer(s): N/A GI prophylaxis: PPI Lines: RUE PICC Line and still needed  Foley:  Yes, and it is still needed Code Status:  full code Last date of multidisciplinary goals of care discussion [09/15/2023]  01/13: Will update pts family today  Labs   CBC: Recent Labs  Lab 09/10/23 1312 09/11/23 0530 09/13/23 1148 09/14/23 0421 09/15/23 0439  WBC 14.2* 7.6 17.8* 11.6* 12.0*  NEUTROABS 12.4*  --   --   --   --   HGB 15.2 11.9* 13.3 11.6* 10.8*  HCT 46.8 36.1* 41.3 36.4* 33.4*  MCV 94.7 94.5 97.4 96.3 94.6  PLT 242 192 247 201 165    Basic Metabolic Panel: Recent Labs  Lab 09/10/23 1312 09/11/23 0530 09/13/23 1350 09/13/23 1725 09/14/23 0421 09/14/23 1552 09/15/23 0439  NA 138 140 134*  --  133*  --   --   K 3.7 3.9 4.5  --  4.2  --   --   CL 101 105 98  --  94*  --   --   CO2 24 26 23   --  27  --   --   GLUCOSE 162* 170* 196*  --  249*  --   --   BUN 12 14 22   --  26*  --   --   CREATININE 0.69 0.64 0.96  --  0.88  --   --   CALCIUM 9.0 8.3* 8.5*  --  8.7*  --   --   MG  --   --   --  2.2 2.4 2.2 2.2  PHOS  --   --   --  2.5 3.5 2.3* 2.2*   GFR: Estimated Creatinine Clearance: 68.3 mL/min (by C-G formula based on SCr of 0.88 mg/dL). Recent Labs  Lab 09/10/23 1315 09/10/23 1535 09/10/23 1823 09/10/23 1939 09/11/23 0530 09/13/23 1148 09/14/23 0421 09/15/23 0439  WBC  --   --    --   --  7.6 17.8* 11.6* 12.0*  LATICACIDVEN 1.6 2.7* 1.1 1.8  --   --   --   --     Liver Function Tests: Recent Labs  Lab 09/10/23 1312 09/13/23 1350  AST 20 31  ALT 20 24  ALKPHOS 79 76  BILITOT 1.0 0.6  PROT 7.1 6.2*  ALBUMIN  3.8 3.4*   No results for input(s): LIPASE, AMYLASE in the last 168 hours. No results for input(s): AMMONIA in the last 168 hours.  ABG  Component Value Date/Time   PHART 7.41 09/14/2023 0819   PCO2ART 48 09/14/2023 0819   PO2ART 93 09/14/2023 0819   HCO3 30.4 (H) 09/14/2023 0819   O2SAT 98.6 09/14/2023 0819     Coagulation Profile: Recent Labs  Lab 09/10/23 1312 09/13/23 1148  INR 1.2 1.1    Cardiac Enzymes: No results for input(s): CKTOTAL, CKMB, CKMBINDEX, TROPONINI in the last 168 hours.  HbA1C: No results found for: HGBA1C  CBG: Recent Labs  Lab 09/14/23 2013 09/15/23 0034 09/15/23 0257 09/15/23 0744 09/15/23 0746  GLUCAP 250* 483* 356* 38* 42*    Review of Systems:   Unable to assess pt mechanically intubated   Past Medical History:  He,  has a past medical history of COPD (chronic obstructive pulmonary disease) (HCC), DVT (deep venous thrombosis) (HCC), Dyspnea, GERD (gastroesophageal reflux disease), HLD (hyperlipidemia), migraines, Oxygen dependent, Pneumonia (2023), Pre-diabetes, Pulmonary nodule, Rotator cuff tear, right, Tobacco abuse, and Tobacco use.   Surgical History:   Past Surgical History:  Procedure Laterality Date   CHEST TUBE INSERTION     SHOULDER ARTHROSCOPY WITH SUBACROMIAL DECOMPRESSION, ROTATOR CUFF REPAIR AND BICEP TENDON REPAIR Right 02/18/2023   Procedure: RIGHT SHOULDER ARTHROSCOPY WITH DEBRIDEMENT, DECOMPRESSION, ROTATOR CUFF REPAIR, BICEPS TENODESIS.;  Surgeon: Edie Norleen PARAS, MD;  Location: [facility] ORS;  Service: Orthopedics;  Laterality: Right;     Social History:   reports that he has been smoking cigarettes. He has a 12 pack-year smoking history. He has never used  smokeless tobacco. He reports current alcohol  use. He reports that he does not use drugs.   Family History:  His family history is not on file.   Allergies No Active Allergies   Home Medications  Prior to Admission medications   Medication Sig Start Date End Date Taking? Authorizing Provider  albuterol  (PROVENTIL ) (2.5 MG/3ML) 0.083% nebulizer solution USE 1 VIAL IN NEBULIZER EVERY 6 HOURS AS NEEDED FOR WHEEZING 06/28/21  Yes [provider]  albuterol  (VENTOLIN  HFA) 108 (90 Base) MCG/ACT inhaler Inhale 1 puff into the lungs every 4 (four) hours as needed for wheezing or shortness of breath.   Yes [provider]  APIXABAN  (ELIQUIS ) VTE STARTER PACK (10MG  AND 5MG ) Take as directed on package: start with two-5mg  tablets twice daily for 7 days. On day 8, switch to one-5mg  tablet twice daily. 02/26/23  Yes Bradler, Evan K, MD  ondansetron  (ZOFRAN ) 4 MG tablet Take 1 tablet (4 mg total) by mouth every 6 (six) hours as needed for nausea. 02/19/23  Yes Kip Lynwood Double, PA-C  oxyCODONE  (OXY IR/ROXICODONE ) 5 MG immediate release tablet Take 1 tablet by mouth 2 (two) times daily as needed. 08/29/23  Yes [provider]  OXYGEN Inhale 3 L into the lungs continuous.   Yes [provider]  predniSONE  (DELTASONE ) 10 MG tablet Take 2-6 tablets by mouth daily with breakfast.  ORIGINAL DPH:ujxz 6 tabs daily x4 days; then 4 tabs daily x4 days; then 2 tabs daily x4 days.   Yes [provider]  predniSONE  (DELTASONE ) 20 MG tablet Take 20 mg by mouth daily with breakfast.   Yes [provider]  TRELEGY ELLIPTA 100-62.5-25 MCG/ACT AEPB Inhale 1 puff into the lungs every morning. 07/04/21  Yes [provider]  amoxicillin-clavulanate (AUGMENTIN) 875-125 MG tablet Take 1 tablet by mouth every 12 (twelve) hours. Patient not taking: Reported on 09/10/2023    [provider]  benralizumab (FASENRA PEN) 30 MG/ML prefilled autoinjector Inject 30 mg  into the skin as  directed. Patient not taking: Reported on 09/10/2023 07/23/23   [provider]  nystatin (MYCOSTATIN) 100000 UNIT/ML suspension Take 5 mLs by mouth 4 (four) times daily. Patient not taking: Reported on 09/10/2023 09/04/23   [provider]  omeprazole  (PRILOSEC ) 20 MG capsule Take 20 mg by mouth daily. Patient not taking: Reported on 09/10/2023    [provider]  oxyCODONE  (OXY IR/ROXICODONE ) 5 MG immediate release tablet Take 1-2 tablets (5-10 mg total) by mouth every 4 (four) hours as needed for severe pain. 02/19/23   Kip Lynwood Double, PA-C     Critical care time: 40 minutes       Lonell Moose, [facility]  Pulmonary/Critical Care Pager [phone] (please enter 7 digits) PCCM Consult Pager [phone] (please enter 7 digits)

## 2023-09-15 NOTE — Consult Note (Signed)
 PHARMACY - ANTICOAGULATION CONSULT NOTE  Pharmacy Consult for IV Heparin  Indication:  Hx DVT (apixaban  on hold)  Patient Measurements: Height: 6' (182.9 cm) Weight: 54.8 kg (120 lb 13 oz) IBW/kg (Calculated) : 77.6 Heparin  Dosing Weight: 52.2 kg  Labs: Recent Labs    09/13/23 1148 09/13/23 1350 09/13/23 1725 09/13/23 2059 09/14/23 0131 09/14/23 0421 09/14/23 0924 09/14/23 1546 09/14/23 2159 09/15/23 0439 09/15/23 0833 09/15/23 1242  HGB 13.3  --   --   --   --  11.6*  --   --   --  10.8* 10.8*  --   HCT 41.3  --   --   --   --  36.4*  --   --   --  33.4* 31.8*  --   PLT 247  --   --   --   --  201  --   --   --  165 161  --   APTT 25  --    < >  --  106*  --  89*   < > 125* 115*  --  79*  LABPROT 14.0  --   --   --   --   --   --   --   --   --   --   --   INR 1.1  --   --   --   --   --   --   --   --   --   --   --   HEPARINUNFRC  --   --   --   --   --   --  >1.10*  --   --  1.01*  --   --   CREATININE  --  0.96  --   --   --  0.88  --   --   --   --  0.82  --   TROPONINIHS  --   --   --  115* 103*  --   --   --   --   --   --   --    < > = values in this interval not displayed.    Estimated Creatinine Clearance: 73.3 mL/min (by C-G formula based on SCr of 0.82 mg/dL).  Medical History: Past Medical History:  Diagnosis Date   COPD (chronic obstructive pulmonary disease) (HCC)    DVT (deep venous thrombosis) (HCC)    Dyspnea    GERD (gastroesophageal reflux disease)    no meds   HLD (hyperlipidemia)    Hx of migraines    Oxygen dependent    3 L [place]   Pneumonia 2023   Pre-diabetes    Pulmonary nodule    Rotator cuff tear, right    Tobacco abuse    Tobacco use     Medications:  Apixaban  5 mg BID, last dose 09/12/23 at 2206  Assessment: 63 y/o M with medical history as above and including hx DVT on apixaban  who is admitted with acute on chronic respiratory failure and now intubated. Pharmacy consulted for IV heparin .  Hgb trending down (13.3 > 11.6),  unsure what may have caused this. Continue to monitor trend.    Date/Time aPTT (HL) Rate  Comment 1/11 1725 48  800 units/hr Subtherapeutic 1/12 0131 106  950 units/hr Supratherapeutic 1/12 0924 89 (HL > 1.1)  900 units/hr Therapeutic x 1 1/12 1546 100  900 units/hr Therapeutic x 2 1/12 2159 125  900 units/hr Supratherapeutic 1/13 0439  115 / 1.01 800 units/hr Supratherapeutic 1/13 1242 79 / --  700 units/hr Therapeutic x 1  Goal of Therapy:  aPTT 66 - 102 seconds Monitor platelets by anticoagulation protocol: Yes   Plan:  Continue heparin  infusion at 700 units/hr.  Check confirmatory aPTT in 6 hrs. HL and CBC with AM labs.   Siraj Dermody A Alphonsa Brickle, PharmD Clinical Pharmacist 09/15/2023 1:58 PM

## 2023-09-15 NOTE — Progress Notes (Signed)
 Hypoglycemic Event  CBG: 42  Treatment: D50 50 mL (25 gm)  Symptoms: Sweaty  Follow-up CBG: Time:0806 CBG Result:147  Possible Reasons for Event: Inadequate meal intake  Comments/MD notified:Dana NP     Suzzanne Cloud

## 2023-09-15 NOTE — Progress Notes (Signed)
 Nutrition Follow Up Note   DOCUMENTATION CODES:   Non-severe (moderate) malnutrition in context of chronic illness  INTERVENTION:   Vital 1.5@50ml /hr- Initiate at 31ml/hr and increase by 10ml/hr q 8 hours until goal rate is reached.   ProSource TF 20- Give 60ml daily via tube, each supplement provides 80kcal and 20g of protein.   Free water  flushes 30ml q4 hours to maintain tube patency   Regimen provides 1880kcal/day, 101g/day protein and 1075ml/day of free water .   Pt at high refeed risk; recommend monitor potassium, magnesium  and phosphorus labs daily until stable  Daily weights   NUTRITION DIAGNOSIS:   Moderate Malnutrition related to chronic illness (pt sedated and ventilated) as evidenced by moderate fat depletion, moderate muscle depletion. -new diagnosis   GOAL:   Provide needs based on ASPEN/SCCM guidelines -not met   MONITOR:   Vent status, Labs, Weight trends, TF tolerance, Skin, I & O's  ASSESSMENT:   63 y/o male with h/o COPD, DVT, HLD, GERD, pulmonary nodule and DM who is admitted with RSV, pneumonia and COPD exacerbation.  Pt sedated and ventilated. OGT in place with side port noted at the gastric cardia; spoke with NP regarding the recommendation to advance side port into the stomach. Tube feeds were held overnight over concern for aspiration; plan is to restart feeds today. Pt is refeeding; electrolytes being monitored and supplemented. Pt failed SBTs today; would recommend supplementation of phosphorus prior to extubation. No BM since 1/9; suppository given today.   Medications reviewed and include: colace, insulin , solu-medrol , MVI, protonix , miralax , thiamine , heparin , levophed , zosyn , propofol     Labs reviewed: Na 130(L), K 4.2 wnl, P 2.2(L), Mg 2.2 wnl Wbc- 13.3(H), Hgb 10.8(L), Hct 31.8(L) Cbgs- 79, 147, 42, 38, 356, 483 x 24 hrs   Patient is currently intubated on ventilator support MV: 9.5 L/min Temp (24hrs), Avg:99.1 F (37.3 C), Min:98.3 F  (36.8 C), Max:100.1 F (37.8 C)  Propofol : 13.15 ml/hr- provides 347kcal/day   MAP >72mmHg   UOP-   NUTRITION - FOCUSED PHYSICAL EXAM:  Flowsheet Row Most Recent Value  Orbital Region No depletion  Upper Arm Region Moderate depletion  Thoracic and Lumbar Region Mild depletion  Buccal Region No depletion  Temple Region No depletion  Clavicle Bone Region Moderate depletion  Clavicle and Acromion Bone Region Moderate depletion  Scapular Bone Region No depletion  Dorsal Hand No depletion  Patellar Region Severe depletion  Anterior Thigh Region Severe depletion  Posterior Calf Region Severe depletion  Edema (RD Assessment) None  Hair Reviewed  Eyes Reviewed  Mouth Reviewed  Skin Reviewed  Nails Reviewed   Diet Order:   Diet Order     None      EDUCATION NEEDS:   No education needs have been identified at this time  Skin:  Skin Assessment: Reviewed RN Assessment  Last BM:  1/9  Height:   Ht Readings from Last 1 Encounters:  09/10/23 6' (1.829 m)    Weight:   Wt Readings from Last 1 Encounters:  09/14/23 54.8 kg    Ideal Body Weight:  80.9 kg  BMI:  Body mass index is 16.39 kg/m.  Estimated Nutritional Needs:   Kcal:  1734kcal/day  Protein:  90-100g/day  Fluid:  1.7-1.9L/day  Augustin Shams MS, RD, LDN If unable to be reached, please send secure chat to RD inpatient available from 8:00a-4:00p daily

## 2023-09-16 ENCOUNTER — Inpatient Hospital Stay: Payer: 59

## 2023-09-16 ENCOUNTER — Ambulatory Visit: Payer: 59

## 2023-09-16 DIAGNOSIS — J441 Chronic obstructive pulmonary disease with (acute) exacerbation: Secondary | ICD-10-CM | POA: Diagnosis not present

## 2023-09-16 LAB — BASIC METABOLIC PANEL
Anion gap: 11 (ref 5–15)
BUN: 21 mg/dL (ref 8–23)
CO2: 27 mmol/L (ref 22–32)
Calcium: 8.7 mg/dL — ABNORMAL LOW (ref 8.9–10.3)
Chloride: 92 mmol/L — ABNORMAL LOW (ref 98–111)
Creatinine, Ser: 0.82 mg/dL (ref 0.61–1.24)
GFR, Estimated: >60 mL/min (ref >60)
Glucose, Bld: 267 mg/dL — ABNORMAL HIGH (ref 70–99)
Potassium: 4.9 mmol/L (ref 3.5–5.1)
Sodium: 130 mmol/L — ABNORMAL LOW (ref 135–145)

## 2023-09-16 LAB — BLOOD GAS, ARTERIAL
Acid-Base Excess: 5.8 mmol/L — ABNORMAL HIGH (ref 0.0–2.0)
Bicarbonate: 31.2 mmol/L — ABNORMAL HIGH (ref 20.0–28.0)
FIO2: 30 %
MECHVT: 480 mL
Mechanical Rate: 18
O2 Saturation: 100 %
PEEP: 5 cmH2O
Patient temperature: 37
pCO2 arterial: 47 mm[Hg] (ref 32–48)
pH, Arterial: 7.43 (ref 7.35–7.45)
pO2, Arterial: 106 mm[Hg] (ref 83–108)

## 2023-09-16 LAB — APTT
aPTT: 73 s — ABNORMAL HIGH (ref 24–36)
aPTT: 51 s — ABNORMAL HIGH (ref 24–36)
aPTT: 52 s — ABNORMAL HIGH (ref 24–36)
aPTT: 66 s — ABNORMAL HIGH (ref 24–36)

## 2023-09-16 LAB — CBC WITH DIFFERENTIAL/PLATELET
Abs Immature Granulocytes: 0.11 10*3/uL — ABNORMAL HIGH (ref 0.00–0.07)
Basophils Absolute: 0 10*3/uL (ref 0.0–0.1)
Basophils Relative: 0 %
Eosinophils Absolute: 0 10*3/uL (ref 0.0–0.5)
Eosinophils Relative: 0 %
HCT: 34.2 % — ABNORMAL LOW (ref 39.0–52.0)
Hemoglobin: 11.3 g/dL — ABNORMAL LOW (ref 13.0–17.0)
Immature Granulocytes: 1 %
Lymphocytes Relative: 5 %
Lymphs Abs: 0.7 10*3/uL (ref 0.7–4.0)
MCH: 30.7 pg (ref 26.0–34.0)
MCHC: 33 g/dL (ref 30.0–36.0)
MCV: 92.9 fL (ref 80.0–100.0)
Monocytes Absolute: 0.7 10*3/uL (ref 0.1–1.0)
Monocytes Relative: 5 %
Neutro Abs: 11.2 10*3/uL — ABNORMAL HIGH (ref 1.7–7.7)
Neutrophils Relative %: 89 %
Platelets: 170 10*3/uL (ref 150–400)
RBC: 3.68 MIL/uL — ABNORMAL LOW (ref 4.22–5.81)
RDW: 14.6 % (ref 11.5–15.5)
WBC: 12.6 10*3/uL — ABNORMAL HIGH (ref 4.0–10.5)
nRBC: 0 % (ref 0.0–0.2)

## 2023-09-16 LAB — GLUCOSE, CAPILLARY
Glucose-Capillary: 138 mg/dL — ABNORMAL HIGH (ref 70–99)
Glucose-Capillary: 208 mg/dL — ABNORMAL HIGH (ref 70–99)
Glucose-Capillary: 231 mg/dL — ABNORMAL HIGH (ref 70–99)
Glucose-Capillary: 231 mg/dL — ABNORMAL HIGH (ref 70–99)
Glucose-Capillary: 233 mg/dL — ABNORMAL HIGH (ref 70–99)
Glucose-Capillary: 265 mg/dL — ABNORMAL HIGH (ref 70–99)

## 2023-09-16 LAB — HEMOGLOBIN A1C
Hgb A1c MFr Bld: 6.4 % — ABNORMAL HIGH (ref 4.8–5.6)
Mean Plasma Glucose: 136.98 mg/dL

## 2023-09-16 LAB — PHOSPHORUS: Phosphorus: 2.4 mg/dL — ABNORMAL LOW (ref 2.5–4.6)

## 2023-09-16 LAB — MAGNESIUM: Magnesium: 2.5 mg/dL — ABNORMAL HIGH (ref 1.7–2.4)

## 2023-09-16 LAB — HEPARIN LEVEL (UNFRACTIONATED): Heparin Unfractionated: 0.71 [IU]/mL — ABNORMAL HIGH (ref 0.30–0.70)

## 2023-09-16 MED ORDER — LEVETIRACETAM IN NACL 1000 MG/100ML IV SOLN: 1000.0000 mg | Freq: Once | INTRAVENOUS | Status: DC

## 2023-09-16 MED ORDER — ARFORMOTEROL TARTRATE 15 MCG/2ML IN NEBU
15.0000 ug | INHALATION_SOLUTION | Freq: Two times a day (BID) | RESPIRATORY_TRACT | Status: DC
Start: 2023-09-16 — End: 2023-10-06
  Administered 2023-09-16 – 2023-10-05 (×36): 15 ug via RESPIRATORY_TRACT
  Filled 2023-09-16 (×40): qty 2

## 2023-09-16 MED ORDER — DEXMEDETOMIDINE HCL IN NACL 400 MCG/100ML IV SOLN
0.0000 ug/kg/h | INTRAVENOUS | Status: DC
  Administered 2023-09-19 – 2023-09-21 (×2): 0.4 ug/kg/h via INTRAVENOUS
  Administered 2023-09-21: 0.8 ug/kg/h via INTRAVENOUS
  Administered 2023-09-22: 0.7 ug/kg/h via INTRAVENOUS
  Filled 2023-09-16 (×4): qty 100

## 2023-09-16 MED ORDER — MIDAZOLAM HCL 2 MG/2ML IJ SOLN
1.0000 mg | INTRAMUSCULAR | Status: DC | PRN
  Administered 2023-09-17 – 2023-09-21 (×3): 2 mg via INTRAVENOUS
  Filled 2023-09-16 (×5): qty 2

## 2023-09-16 MED ORDER — INSULIN ASPART 100 UNIT/ML IJ SOLN
2.0000 [IU] | Freq: Once | INTRAMUSCULAR | Status: AC
  Administered 2023-09-16: 2 [IU] via SUBCUTANEOUS

## 2023-09-16 MED ORDER — LEVETIRACETAM IN NACL 1000 MG/100ML IV SOLN
1000.0000 mg | Freq: Once | INTRAVENOUS | Status: AC
  Administered 2023-09-16: 1000 mg via INTRAVENOUS
  Filled 2023-09-16: qty 100

## 2023-09-16 MED ORDER — HEPARIN BOLUS VIA INFUSION
800.0000 [IU] | Freq: Once | INTRAVENOUS | Status: AC
  Administered 2023-09-16: 800 [IU] via INTRAVENOUS
  Filled 2023-09-16: qty 800

## 2023-09-16 MED ORDER — MIDAZOLAM HCL 2 MG/2ML IJ SOLN
INTRAMUSCULAR | Status: AC
  Administered 2023-09-16: 2 mg
  Filled 2023-09-16: qty 2

## 2023-09-16 MED ORDER — LEVETIRACETAM IN NACL 1000 MG/100ML IV SOLN
1000.0000 mg | Freq: Two times a day (BID) | INTRAVENOUS | Status: DC
  Filled 2023-09-16: qty 100

## 2023-09-16 MED ORDER — INSULIN ASPART 100 UNIT/ML IJ SOLN
6.0000 [IU] | INTRAMUSCULAR | Status: DC
  Administered 2023-09-16: 6 [IU] via SUBCUTANEOUS
  Filled 2023-09-16: qty 1

## 2023-09-16 MED ORDER — INSULIN ASPART 100 UNIT/ML IJ SOLN
4.0000 [IU] | INTRAMUSCULAR | Status: DC
  Administered 2023-09-16 (×3): 4 [IU] via SUBCUTANEOUS
  Filled 2023-09-16 (×3): qty 1

## 2023-09-16 MED ORDER — LEVETIRACETAM 500 MG/5ML IV SOLN: 2000.0000 mg | Freq: Once | INTRAVENOUS | Status: DC

## 2023-09-16 NOTE — Consult Note (Signed)
 PHARMACY - ANTICOAGULATION CONSULT NOTE  Pharmacy Consult for IV Heparin  Indication:  Hx DVT (apixaban  on hold)  Patient Measurements: Height: 6' (182.9 cm) Weight: 57.5 kg (126 lb 12.2 oz) IBW/kg (Calculated) : 77.6 Heparin  Dosing Weight: 52.2 kg  Labs: Recent Labs    09/14/23 0131 09/14/23 0131 09/14/23 0421 09/14/23 0924 09/14/23 1546 09/15/23 0439 09/15/23 0833 09/15/23 1242 09/16/23 0225 09/16/23 0805 09/16/23 1459 09/16/23 2223  HGB  --    < > 11.6*  --   --  10.8* 10.8*  --  11.3*  --   --   --   HCT  --    < > 36.4*  --   --  33.4* 31.8*  --  34.2*  --   --   --   PLT  --    < > 201  --   --  165 161  --  170  --   --   --   APTT 106*  --   --  89*   < > 115*  --    < > 73* 51* 52* 66*  HEPARINUNFRC  --   --   --  >1.10*  --  1.01*  --   --  0.71*  --   --   --   CREATININE  --   --  0.88  --   --   --  0.82  --  0.82  --   --   --   TROPONINIHS 103*  --   --   --   --   --   --   --   --   --   --   --    < > = values in this interval not displayed.    Estimated Creatinine Clearance: 76.9 mL/min (by C-G formula based on SCr of 0.82 mg/dL).  Medical History: Past Medical History:  Diagnosis Date   COPD (chronic obstructive pulmonary disease) (HCC)    DVT (deep venous thrombosis) (HCC)    Dyspnea    GERD (gastroesophageal reflux disease)    no meds   HLD (hyperlipidemia)    Hx of migraines    Oxygen dependent    3 L [place]   Pneumonia 2023   Pre-diabetes    Pulmonary nodule    Rotator cuff tear, right    Tobacco abuse    Tobacco use     Medications:  Apixaban  5 mg BID, last dose 09/12/23 at 2206  Assessment: 63 y/o M with medical history as above and including hx DVT on apixaban  who is admitted with acute on chronic respiratory failure and now intubated. Pharmacy consulted for IV heparin .  Hgb trending down (13.3 > 11.6), unsure what may have caused this. Continue to monitor trend.    Date/Time aPTT (HL) Rate  Comment 1/11 1725 48  800  units/hr Subtherapeutic 1/12 0131 106  950 units/hr Supratherapeutic 1/12 0924 89 (HL > 1.1)  900 units/hr Therapeutic x 1 1/12 1546 100  900 units/hr Therapeutic x 2 1/12 2159 125  900 units/hr Supratherapeutic 1/13 0439 115 / 1.01 800 units/hr Supratherapeutic 1/13 1242 79 / --  700 units/hr Therapeutic x 1 1/13 2025 108 / --  700 units/hr Supratherapeutic 1/14 0225      73 / 0.71         650 units/hr     Therapeutic x 1 1/14 0805 51 / --  650 units/hr Subtherapeutic 1/14 1459 52 / --  750 units/hr Subtherapeutic  1/14 2223       66                    850 units/hr     Therapeutic x 1  Per RN, no issues with infusion, no signs/symptoms of bleeding.  Goal of Therapy:  aPTT 66 - 102 seconds Monitor platelets by anticoagulation protocol: Yes   Plan:  1/14:  aPTT @ 2223 = 66, therapeutic X 1  - Recheck aPTT and HL in 6 hours - will use aPTT to guide dosing until correlating with HL  - HL and CBC with AM labs.   Thank you for involving pharmacy in this patient's care.   Orvel Cutsforth D Clinical Pharmacist 09/16/2023 11:01 PM

## 2023-09-16 NOTE — Progress Notes (Signed)
 NAME:  Benjamin Bolton, MRN:  [id number], DOB:  02/06/1961, LOS: 6 ADMISSION DATE:  09/10/2023, CONSULTATION DATE: 09/13/2023  History of Present Illness:  Benjamin Bolton is a 63 year old male with a history of severe COPD followed by [facility] who presents to the hospital from [facility] clinic on 1/8 with increased shortness of breath. He was admitted to the medicine service for management of COPD exacerbation and found to have an RSV infection.   Patient was admitted to [facility] and initiated on nebulizers, steroids, and antibiotics. He was managed medically for COPD exacerbation and UTI but developed worsening respiratory distress this AM requiring the initiation of BiPAP. Rapid response was called and he was transferred to the ICU. Initial blood gas showed pH of 7.2, with repeat VBG showing he remained acidemic at 7.18. Given altered mental status and respiratory failure, decision made at the bedside to proceed with emergent intubation.  Pertinent  Medical History  DVT on Eliquis  COPD on 2L O2 Type II Diabetes Mellitus  GERD HLD Pulmonary Nodule Tobacco Abuse   Significant Hospital Events: Including procedures, antibiotic start and stop dates in addition to other pertinent events   1/08: admit to [facility] 1/11: rapid response, transfer to ICU, intubated 1/12: remains vented, sedated, does not follow commands.  Failed SBT  1/13: Overnight due to concerns of aspiration TF's held.  Will repeat SBT today 1/14: Overnight pt had possible seizure activity with rhythmic tremors noted in the mouth/lower face/neck during episode pt unresponsive to pain.  Received 2 mg of versed  and 2g keppra  bolus with resolution of symptoms.  CT Head negative.  Standing dose of keppra  1g bid.  Neurology consulted.     Interim History / Subjective:  During morning assessment pt had rhythmic tremors in the mouth/lower face/neck, but able to follow commands in the bilateral upper extremity and lower extremity   Objective   Blood  pressure 102/73, pulse 80, temperature 98.5 F (36.9 C), temperature source Oral, resp. rate 18, height 6' (1.829 m), weight 57.5 kg, SpO2 98%.    Vent Mode: PRVC FiO2 (%):  [30 %] 30 % Set Rate:  [18 bmp-22 bmp] 18 bmp Vt Set:  [480 mL] 480 mL PEEP:  [5 cmH20] 5 cmH20 Pressure Support:  [5 cmH20] 5 cmH20 Plateau Pressure:  [18 cmH20-26 cmH20] 26 cmH20   Intake/Output Summary (Last 24 hours) at 09/16/2023 0749 Last data filed at 09/16/2023 0600 Gross per 24 hour  Intake 2863.53 ml  Output 1700 ml  Net 1163.53 ml   Filed Weights   09/13/23 1240 09/14/23 0430 09/16/23 0230  Weight: 52.2 kg 54.8 kg 57.5 kg    Examination: General: Acutely-ill appearing male, NAD mechanically intubated HENT: Supple, no JVD  Lungs: Faint rhonchi with faint expiratory wheezes throughout, even, non labored  Cardiovascular: Sinus tachycardia, no m/r/g, 2+ radial/1+ distal, no edema  Abdomen: +BS x4, slightly distended, taut   Extremities: Normal bulk and tone Neuro: Sedated, following commands, rhythmic tremors in the mouth/lower face/neck, PERRL GU: Indwelling foley catheter draining yellow urine with sediment   Resolved Hospital Problem list     Assessment & Plan:   #Acute toxic metabolic encephalopathy  #Mechanically intubation pain/discomfort  #Possible seizure activity  - Correct metabolic derangements  - Avoid sedating medications as able  - Maintain RASS goal 0 to -1 - PAD protocol to maintain RASS goal: Prn fentanyl  and propofol  gtts; will start precedex  gtt and attempt to wean propofol  gtt for SBT  - Scheduled valium  for  anxiety/delirium  - WUA daily - Continue thiamine  and mvi  - EEG pending  - ABG pending to assess for hypercapnia which may be contributing to tremors  - Will continue keppra  for now  - Neurology consulted appreciate input  - Seizure precautions   #Mildly elevated troponin secondary to demand ischemia  #Hypotension suspect secondary to sedating medication Hx:  HLD and DVT on Eliquis  Echo 09/14/23: 50 to 55% - Continuous telemetry monitoring  - Troponin peaked at 115 - Prn levophed  gtt to maintain map 65 or higher  - Continue heparin  gtt: dosing per pharmacy  - Cardiology signed off no further cardiac evaluations indicated at this time   #Acute respiratory failure #AECOPD   #Pneumonia  #RSV #Mechanical intubation  - Full vent support for now: vent settings reviewed and established  - Continue lung protective strategies  - Maintain plateau pressures 30 cm H2O - SBT once all parameters met  - Scheduled and prn bronchodilator therapy  - IV steroids: wean as tolerated - Intermittent CXR and ABG;s  #Hyponatremia  #Hypophosphatemia  - Trend BMP  - Replace electrolytes as indicated  - Strict I&O's   #Anemia without obvious signs of acute blood loss  - Trend CBC  - Monitor for s/sx of bleeding  - Transfuse for hgb <7   #Type II diabetes mellitus  #Hypoglycemia~resolved - CBG's q4hrs  - Continue resistance SSI and will start scheduled novolog  4 units q4hrs if TF's infusing  - Diabetes coordinator consulted appreciate input  - Follow hypo/hyperglycemic protocol  - Target CBG's 140 to 180   Best Practice (right click and Reselect all SmartList Selections daily)   Diet/type: TF's DVT prophylaxis systemic heparin  Pressure ulcer(s): N/A GI prophylaxis: PPI Lines: RUE PICC Line and still needed  Foley:  Yes, and it is still needed Code Status:  full code Last date of multidisciplinary goals of care discussion [09/16/2023]  01/14: Will update pts family today  Labs   CBC: Recent Labs  Lab 09/10/23 1312 09/11/23 0530 09/13/23 1148 09/14/23 0421 09/15/23 0439 09/15/23 0833 09/16/23 0225  WBC 14.2*   < > 17.8* 11.6* 12.0* 13.3* 12.6*  NEUTROABS 12.4*  --   --   --   --  11.0* 11.2*  HGB 15.2   < > 13.3 11.6* 10.8* 10.8* 11.3*  HCT 46.8   < > 41.3 36.4* 33.4* 31.8* 34.2*  MCV 94.7   < > 97.4 96.3 94.6 93.5 92.9  PLT 242   <  > 247 201 165 161 170   < > = values in this interval not displayed.    Basic Metabolic Panel: Recent Labs  Lab 09/11/23 0530 09/13/23 1350 09/13/23 1725 09/14/23 0421 09/14/23 1552 09/15/23 0439 09/15/23 0833 09/16/23 0225  NA 140 134*  --  133*  --   --  130* 130*  K 3.9 4.5  --  4.2  --   --  4.2 4.9  CL 105 98  --  94*  --   --  92* 92*  CO2 26 23  --  27  --   --  24 27  GLUCOSE 170* 196*  --  249*  --   --  69* 267*  BUN 14 22  --  26*  --   --  18 21  CREATININE 0.64 0.96  --  0.88  --   --  0.82 0.82  CALCIUM 8.3* 8.5*  --  8.7*  --   --  8.4* 8.7*  MG  --   --  2.2 2.4 2.2 2.2  --  2.5*  PHOS  --   --  2.5 3.5 2.3* 2.2*  --  2.4*   GFR: Estimated Creatinine Clearance: 76.9 mL/min (by C-G formula based on SCr of 0.82 mg/dL). Recent Labs  Lab 09/10/23 1315 09/10/23 1535 09/10/23 1823 09/10/23 1939 09/11/23 0530 09/14/23 0421 09/15/23 0439 09/15/23 0833 09/16/23 0225  WBC  --   --   --   --    < > 11.6* 12.0* 13.3* 12.6*  LATICACIDVEN 1.6 2.7* 1.1 1.8  --   --   --   --   --    < > = values in this interval not displayed.    Liver Function Tests: Recent Labs  Lab 09/10/23 1312 09/13/23 1350  AST 20 31  ALT 20 24  ALKPHOS 79 76  BILITOT 1.0 0.6  PROT 7.1 6.2*  ALBUMIN  3.8 3.4*   No results for input(s): LIPASE, AMYLASE in the last 168 hours. No results for input(s): AMMONIA in the last 168 hours.  ABG    Component Value Date/Time   PHART 7.41 09/14/2023 0819   PCO2ART 48 09/14/2023 0819   PO2ART 93 09/14/2023 0819   HCO3 30.4 (H) 09/14/2023 0819   O2SAT 98.6 09/14/2023 0819     Coagulation Profile: Recent Labs  Lab 09/10/23 1312 09/13/23 1148  INR 1.2 1.1    Cardiac Enzymes: No results for input(s): CKTOTAL, CKMB, CKMBINDEX, TROPONINI in the last 168 hours.  HbA1C: Hgb A1c MFr Bld  Date/Time Value Ref Range Status  09/15/2023 09:24 PM 6.4 (H) 4.8 - 5.6 % Final    Comment:    (NOTE) Pre diabetes:           5.7%-6.4%  Diabetes:              >6.4%  Glycemic control for   <7.0% adults with diabetes     CBG: Recent Labs  Lab 09/15/23 1943 09/15/23 2226 09/15/23 2337 09/16/23 0329 09/16/23 0745  GLUCAP 296* 270* 269* 231* 265*    Review of Systems:   Unable to assess pt mechanically intubated   Past Medical History:  He,  has a past medical history of COPD (chronic obstructive pulmonary disease) (HCC), DVT (deep venous thrombosis) (HCC), Dyspnea, GERD (gastroesophageal reflux disease), HLD (hyperlipidemia), migraines, Oxygen dependent, Pneumonia (2023), Pre-diabetes, Pulmonary nodule, Rotator cuff tear, right, Tobacco abuse, and Tobacco use.   Surgical History:   Past Surgical History:  Procedure Laterality Date   CHEST TUBE INSERTION     SHOULDER ARTHROSCOPY WITH SUBACROMIAL DECOMPRESSION, ROTATOR CUFF REPAIR AND BICEP TENDON REPAIR Right 02/18/2023   Procedure: RIGHT SHOULDER ARTHROSCOPY WITH DEBRIDEMENT, DECOMPRESSION, ROTATOR CUFF REPAIR, BICEPS TENODESIS.;  Surgeon: Edie Norleen PARAS, MD;  Location: [facility] ORS;  Service: Orthopedics;  Laterality: Right;     Social History:   reports that he has been smoking cigarettes. He has a 12 pack-year smoking history. He has never used smokeless tobacco. He reports current alcohol  use. He reports that he does not use drugs.   Family History:  His family history is not on file.   Allergies No Active Allergies   Home Medications  Prior to Admission medications   Medication Sig Start Date End Date Taking? Authorizing Provider  albuterol  (PROVENTIL ) (2.5 MG/3ML) 0.083% nebulizer solution USE 1 VIAL IN NEBULIZER EVERY 6 HOURS AS NEEDED FOR WHEEZING 06/28/21  Yes [provider]  albuterol  (VENTOLIN  HFA) 108 (90 Base) MCG/ACT inhaler Inhale 1 puff into the lungs every 4 (  four) hours as needed for wheezing or shortness of breath.   Yes [provider]  APIXABAN  (ELIQUIS ) VTE STARTER PACK (10MG  AND 5MG ) Take as directed on  package: start with two-5mg  tablets twice daily for 7 days. On day 8, switch to one-5mg  tablet twice daily. 02/26/23  Yes Bradler, Evan K, MD  ondansetron  (ZOFRAN ) 4 MG tablet Take 1 tablet (4 mg total) by mouth every 6 (six) hours as needed for nausea. 02/19/23  Yes Kip Lynwood Double, PA-C  oxyCODONE  (OXY IR/ROXICODONE ) 5 MG immediate release tablet Take 1 tablet by mouth 2 (two) times daily as needed. 08/29/23  Yes [provider]  OXYGEN Inhale 3 L into the lungs continuous.   Yes [provider]  predniSONE  (DELTASONE ) 10 MG tablet Take 2-6 tablets by mouth daily with breakfast.  ORIGINAL DPH:ujxz 6 tabs daily x4 days; then 4 tabs daily x4 days; then 2 tabs daily x4 days.   Yes [provider]  predniSONE  (DELTASONE ) 20 MG tablet Take 20 mg by mouth daily with breakfast.   Yes [provider]  TRELEGY ELLIPTA 100-62.5-25 MCG/ACT AEPB Inhale 1 puff into the lungs every morning. 07/04/21  Yes [provider]  amoxicillin-clavulanate (AUGMENTIN) 875-125 MG tablet Take 1 tablet by mouth every 12 (twelve) hours. Patient not taking: Reported on 09/10/2023    [provider]  benralizumab (FASENRA PEN) 30 MG/ML prefilled autoinjector Inject 30 mg into the skin as directed. Patient not taking: Reported on 09/10/2023 07/23/23   [provider]  nystatin (MYCOSTATIN) 100000 UNIT/ML suspension Take 5 mLs by mouth 4 (four) times daily. Patient not taking: Reported on 09/10/2023 09/04/23   [provider]  omeprazole  (PRILOSEC ) 20 MG capsule Take 20 mg by mouth daily. Patient not taking: Reported on 09/10/2023    [provider]  oxyCODONE  (OXY IR/ROXICODONE ) 5 MG immediate release tablet Take 1-2 tablets (5-10 mg total) by mouth every 4 (four) hours as needed for severe pain. 02/19/23   Kip Lynwood Double, PA-C     Critical care time: 36 minutes       Lonell Moose, [facility]  Pulmonary/Critical Care Pager [phone] (please enter 7  digits) PCCM Consult Pager [phone] (please enter 7 digits)

## 2023-09-16 NOTE — Inpatient Diabetes Management (Signed)
 Inpatient Diabetes Program Recommendations  AACE/ADA: New Consensus Statement on Inpatient Glycemic Control (2015)  Target Ranges:  Prepandial:   less than 140 mg/dL      Peak postprandial:   less than 180 mg/dL (1-2 hours)      Critically ill patients:  140 - 180 mg/dL    Latest Reference Range & Units 09/15/23 00:34 09/15/23 02:57 09/15/23 07:44 09/15/23 07:46 09/15/23 08:06 09/15/23 11:22 09/15/23 16:37 09/15/23 19:43 09/15/23 22:26  Glucose-Capillary 70 - 99 mg/dL 516 (H) 643 (H)  20 units Novolog   38 (LL) 42 (LL) 147 (H) 79 314 (H)  11 units Novolog   296 (H)  8 units Novolog   270 (H)  (LL): Data is critically low (H): Data is abnormally high  Latest Reference Range & Units 09/15/23 23:37 09/16/23 03:29  Glucose-Capillary 70 - 99 mg/dL 730 (H)  11 units Novolog   231 (H)  7 units Novolog    (H): Data is abnormally high     Current Orders: Novolog  Resistant Correction Scale/ SSI (0-20 units) Q4 hours    MD- Note pt getting Solumedrol 40 mg daily.   Tube Feeds restarted yest around 1pm at 50cc/hr  Please consider the following:  1. Reduce the Novolog  SSI to the 0-15 unit (Moderate) scale Q4 hours  2. Start Novolog  Tube Feed Coverage: Novolog  4 units Q4 hours to start HOLD if Tube feeds HELD for any reason    --Will follow patient during hospitalization--  Adina Rudolpho Arrow RN, MSN, CDCES Diabetes Coordinator Inpatient Glycemic Control Team Team Pager: [phone] (8a-5p)

## 2023-09-16 NOTE — TOC CM/SW Note (Signed)
 Transition of Care [facility]) - Inpatient Brief Assessment   Patient Details  Name: Benjamin Bolton MRN: [id number] Date of Birth: September 09, 1960  [facility] CM/SW Contact:    Corean ONEIDA Haddock, RN Phone Number: 09/16/2023, 9:19 AM   Clinical Narrative:  Patient admitted from [facility] with COPD and RSV positive Currently intubated on ventilator  Neuro consult and EEG pending   Transition of Care Asessment: Insurance and Status: Insurance coverage has been reviewed Patient has primary care physician: Yes [facility])     Prior/Current Home Services: No current home services Social Drivers of Health Review: SDOH reviewed no interventions necessary Readmission risk has been reviewed: Yes Transition of care needs: transition of care needs identified, TOC will continue to follow

## 2023-09-16 NOTE — Consult Note (Signed)
 PHARMACY - ANTICOAGULATION CONSULT NOTE  Pharmacy Consult for IV Heparin  Indication:  Hx DVT (apixaban  on hold)  Patient Measurements: Height: 6' (182.9 cm) Weight: 57.5 kg (126 lb 12.2 oz) IBW/kg (Calculated) : 77.6 Heparin  Dosing Weight: 52.2 kg  Labs: Recent Labs    09/13/23 1148 09/13/23 1350 09/13/23 2059 09/14/23 0131 09/14/23 0421 09/14/23 0924 09/14/23 1546 09/15/23 0439 09/15/23 0833 09/15/23 1242 09/15/23 1829 09/15/23 2025 09/16/23 0225  HGB 13.3  --   --   --  11.6*  --   --  10.8* 10.8*  --   --   --  11.3*  HCT 41.3  --   --   --  36.4*  --   --  33.4* 31.8*  --   --   --  34.2*  PLT 247  --   --   --  201  --   --  165 161  --   --   --  170  APTT 25   < >  --  106*  --  89*   < > 115*  --    < > >200* 108* 73*  LABPROT 14.0  --   --   --   --   --   --   --   --   --   --   --   --   INR 1.1  --   --   --   --   --   --   --   --   --   --   --   --   HEPARINUNFRC  --   --   --   --   --  >1.10*  --  1.01*  --   --   --   --  0.71*  CREATININE  --    < >  --   --  0.88  --   --   --  0.82  --   --   --  0.82  TROPONINIHS  --   --  115* 103*  --   --   --   --   --   --   --   --   --    < > = values in this interval not displayed.    Estimated Creatinine Clearance: 76.9 mL/min (by C-G formula based on SCr of 0.82 mg/dL).  Medical History: Past Medical History:  Diagnosis Date   COPD (chronic obstructive pulmonary disease) (HCC)    DVT (deep venous thrombosis) (HCC)    Dyspnea    GERD (gastroesophageal reflux disease)    no meds   HLD (hyperlipidemia)    Hx of migraines    Oxygen dependent    3 L [place]   Pneumonia 2023   Pre-diabetes    Pulmonary nodule    Rotator cuff tear, right    Tobacco abuse    Tobacco use     Medications:  Apixaban  5 mg BID, last dose 09/12/23 at 2206  Assessment: 63 y/o M with medical history as above and including hx DVT on apixaban  who is admitted with acute on chronic respiratory failure and now intubated.  Pharmacy consulted for IV heparin .  Hgb trending down (13.3 > 11.6), unsure what may have caused this. Continue to monitor trend.    Date/Time aPTT (HL) Rate  Comment 1/11 1725 48  800 units/hr Subtherapeutic 1/12 0131 106  950 units/hr Supratherapeutic 1/12 0924 89 (HL > 1.1)  900 units/hr  Therapeutic x 1 1/12 1546 100  900 units/hr Therapeutic x 2 1/12 2159 125  900 units/hr Supratherapeutic 1/13 0439 115 / 1.01 800 units/hr Supratherapeutic 1/13 1242 79 / --  700 units/hr Therapeutic x 1 1/13 2025 108 / --  700 units/hr Supratherapeutic 1/14 0225         73 / 0.71        650 units/hr     Therapeutic x 1   Per RN, no issues with infusion, no signs/symptoms of bleeding.  Goal of Therapy:  aPTT 66 - 102 seconds Monitor platelets by anticoagulation protocol: Yes   Plan:  1/14 @ 0225:  aPTT = 73,  HL = 0.71 - aPTT therapeutic X 1, HL slightly elevated from Eliquis  PTA - will continue pt on current rate and recheck aPTT in 6 hrs - will recheck HL on 1/15 with AM labs - will use aPTT to guide dosing until correlating with HL  HL and CBC with AM labs.   Thank you for involving pharmacy in this patient's care.   Adalyna Godbee D Clinical Pharmacist 09/16/2023 3:44 AM

## 2023-09-16 NOTE — Progress Notes (Signed)
 Brief Progress Note  Updated pts son Sanad Fearnow regarding pts condition and current plan of care.  Will continue to monitor and assess pt.  Lonell Moose, AGNP  Pulmonary/Critical Care Pager [phone] (please enter 7 digits) PCCM Consult Pager [phone] (please enter 7 digits)

## 2023-09-16 NOTE — Consult Note (Signed)
 PHARMACY - ANTICOAGULATION CONSULT NOTE  Pharmacy Consult for IV Heparin  Indication:  Hx DVT (apixaban  on hold)  Patient Measurements: Height: 6' (182.9 cm) Weight: 57.5 kg (126 lb 12.2 oz) IBW/kg (Calculated) : 77.6 Heparin  Dosing Weight: 52.2 kg  Labs: Recent Labs    09/13/23 2059 09/14/23 0131 09/14/23 0131 09/14/23 0421 09/14/23 0924 09/14/23 1546 09/15/23 0439 09/15/23 0833 09/15/23 1242 09/16/23 0225 09/16/23 0805 09/16/23 1459  HGB  --   --    < > 11.6*  --   --  10.8* 10.8*  --  11.3*  --   --   HCT  --   --    < > 36.4*  --   --  33.4* 31.8*  --  34.2*  --   --   PLT  --   --    < > 201  --   --  165 161  --  170  --   --   APTT  --  106*  --   --  89*   < > 115*  --    < > 73* 51* 52*  HEPARINUNFRC  --   --   --   --  >1.10*  --  1.01*  --   --  0.71*  --   --   CREATININE  --   --   --  0.88  --   --   --  0.82  --  0.82  --   --   TROPONINIHS 115* 103*  --   --   --   --   --   --   --   --   --   --    < > = values in this interval not displayed.    Estimated Creatinine Clearance: 76.9 mL/min (by C-G formula based on SCr of 0.82 mg/dL).  Medical History: Past Medical History:  Diagnosis Date   COPD (chronic obstructive pulmonary disease) (HCC)    DVT (deep venous thrombosis) (HCC)    Dyspnea    GERD (gastroesophageal reflux disease)    no meds   HLD (hyperlipidemia)    Hx of migraines    Oxygen dependent    3 L [place]   Pneumonia 2023   Pre-diabetes    Pulmonary nodule    Rotator cuff tear, right    Tobacco abuse    Tobacco use     Medications:  Apixaban  5 mg BID, last dose 09/12/23 at 2206  Assessment: 63 y/o M with medical history as above and including hx DVT on apixaban  who is admitted with acute on chronic respiratory failure and now intubated. Pharmacy consulted for IV heparin .  Hgb trending down (13.3 > 11.6), unsure what may have caused this. Continue to monitor trend.    Date/Time aPTT (HL) Rate  Comment 1/11 1725 48  800  units/hr Subtherapeutic 1/12 0131 106  950 units/hr Supratherapeutic 1/12 0924 89 (HL > 1.1)  900 units/hr Therapeutic x 1 1/12 1546 100  900 units/hr Therapeutic x 2 1/12 2159 125  900 units/hr Supratherapeutic 1/13 0439 115 / 1.01 800 units/hr Supratherapeutic 1/13 1242 79 / --  700 units/hr Therapeutic x 1 1/13 2025 108 / --  700 units/hr Supratherapeutic 1/14 0225      73 / 0.71         650 units/hr     Therapeutic x 1 1/14 0805 51 / --  650 units/hr Subtherapeutic 1/14 1459 52 / --  750 units/hr Subtherapeutic  Per RN, no issues with infusion, no signs/symptoms of bleeding.  Goal of Therapy:  aPTT 66 - 102 seconds Monitor platelets by anticoagulation protocol: Yes   Plan:  - Bolus 800 units x 1 - Increase heparin  infusion rate to 850 units/hr - Recheck aPTT in 6 hours after rate change - Recheck HL on 1/15 with AM labs - will use aPTT to guide dosing until correlating with HL  - HL and CBC with AM labs.   Thank you for involving pharmacy in this patient's care.   Beni Turrell A Suzane Vanderweide Clinical Pharmacist 09/16/2023 3:46 PM

## 2023-09-16 NOTE — Consult Note (Signed)
 PHARMACY - ANTICOAGULATION CONSULT NOTE  Pharmacy Consult for IV Heparin  Indication:  Hx DVT (apixaban  on hold)  Patient Measurements: Height: 6' (182.9 cm) Weight: 57.5 kg (126 lb 12.2 oz) IBW/kg (Calculated) : 77.6 Heparin  Dosing Weight: 52.2 kg  Labs: Recent Labs    09/13/23 1350 09/13/23 2059 09/14/23 0131 09/14/23 0421 09/14/23 0924 09/14/23 1546 09/15/23 0439 09/15/23 0833 09/15/23 1242 09/15/23 2025 09/16/23 0225 09/16/23 0805  HGB   < >  --   --  11.6*  --   --  10.8* 10.8*  --   --  11.3*  --   HCT   < >  --   --  36.4*  --   --  33.4* 31.8*  --   --  34.2*  --   PLT   < >  --   --  201  --   --  165 161  --   --  170  --   APTT  --   --  106*  --  89*   < > 115*  --    < > 108* 73* 51*  HEPARINUNFRC  --   --   --   --  >1.10*  --  1.01*  --   --   --  0.71*  --   CREATININE  --   --   --  0.88  --   --   --  0.82  --   --  0.82  --   TROPONINIHS  --  115* 103*  --   --   --   --   --   --   --   --   --    < > = values in this interval not displayed.    Estimated Creatinine Clearance: 76.9 mL/min (by C-G formula based on SCr of 0.82 mg/dL).  Medical History: Past Medical History:  Diagnosis Date   COPD (chronic obstructive pulmonary disease) (HCC)    DVT (deep venous thrombosis) (HCC)    Dyspnea    GERD (gastroesophageal reflux disease)    no meds   HLD (hyperlipidemia)    Hx of migraines    Oxygen dependent    3 L [place]   Pneumonia 2023   Pre-diabetes    Pulmonary nodule    Rotator cuff tear, right    Tobacco abuse    Tobacco use     Medications:  Apixaban  5 mg BID, last dose 09/12/23 at 2206  Assessment: 63 y/o M with medical history as above and including hx DVT on apixaban  who is admitted with acute on chronic respiratory failure and now intubated. Pharmacy consulted for IV heparin .  Hgb trending down (13.3 > 11.6), unsure what may have caused this. Continue to monitor trend.    Date/Time aPTT (HL) Rate  Comment 1/11 1725 48  800  units/hr Subtherapeutic 1/12 0131 106  950 units/hr Supratherapeutic 1/12 0924 89 (HL > 1.1)  900 units/hr Therapeutic x 1 1/12 1546 100  900 units/hr Therapeutic x 2 1/12 2159 125  900 units/hr Supratherapeutic 1/13 0439 115 / 1.01 800 units/hr Supratherapeutic 1/13 1242 79 / --  700 units/hr Therapeutic x 1 1/13 2025 108 / --  700 units/hr Supratherapeutic 1/14 0225      73 / 0.71         650 units/hr     Therapeutic x 1 1/14 0805 51 / --  650 units/hr Supratherapeutic  Per RN, no issues with infusion, no signs/symptoms  of bleeding.  Goal of Therapy:  aPTT 66 - 102 seconds Monitor platelets by anticoagulation protocol: Yes   Plan:  - Bolus 800 units x 1 - Increase heparin  infusion rate to 750 units/hr - Recheck aPTT in 6 hours after rate change - Recheck HL on 1/15 with AM labs - will use aPTT to guide dosing until correlating with HL  - HL and CBC with AM labs.   Thank you for involving pharmacy in this patient's care.   Kimberely Mccannon A Millee Denise Clinical Pharmacist 09/16/2023 1:38 PM

## 2023-09-16 NOTE — Progress Notes (Signed)
 Suspected New onset Seizure Activity Called bedside by nursing, witnessed seizure like activity with rhythmic tremors noted in the mouth, lower face and neck. Patient had been responsive to pain, during this episode patient unresponsive to painful stimuli. Episode resolved with 2 mg of versed  IVP. - versed  PRN added - Keppra  bolus followed by BID dosing - EEG ordered - STAT CT head ordered - Neurology consulted, appreciate input    Jenita Ruth Rust-Chester, AGACNP-BC Acute Care Nurse Practitioner [facility]   [phone] / [phone] Please see Amion for details.

## 2023-09-17 ENCOUNTER — Inpatient Hospital Stay: Payer: 59

## 2023-09-17 DIAGNOSIS — J441 Chronic obstructive pulmonary disease with (acute) exacerbation: Secondary | ICD-10-CM | POA: Diagnosis not present

## 2023-09-17 LAB — CBC WITH DIFFERENTIAL/PLATELET
Abs Immature Granulocytes: 0.24 10*3/uL — ABNORMAL HIGH (ref 0.00–0.07)
Basophils Absolute: 0.1 10*3/uL (ref 0.0–0.1)
Basophils Relative: 0 %
Eosinophils Absolute: 0 10*3/uL (ref 0.0–0.5)
Eosinophils Relative: 0 %
HCT: 32.2 % — ABNORMAL LOW (ref 39.0–52.0)
Hemoglobin: 10.5 g/dL — ABNORMAL LOW (ref 13.0–17.0)
Immature Granulocytes: 2 %
Lymphocytes Relative: 6 %
Lymphs Abs: 0.7 10*3/uL (ref 0.7–4.0)
MCH: 31.1 pg (ref 26.0–34.0)
MCHC: 32.6 g/dL (ref 30.0–36.0)
MCV: 95.3 fL (ref 80.0–100.0)
Monocytes Absolute: 1 10*3/uL (ref 0.1–1.0)
Monocytes Relative: 8 %
Neutro Abs: 9.7 10*3/uL — ABNORMAL HIGH (ref 1.7–7.7)
Neutrophils Relative %: 84 %
Platelets: 174 10*3/uL (ref 150–400)
RBC: 3.38 MIL/uL — ABNORMAL LOW (ref 4.22–5.81)
RDW: 14.5 % (ref 11.5–15.5)
WBC: 11.6 10*3/uL — ABNORMAL HIGH (ref 4.0–10.5)
nRBC: 1.4 % — ABNORMAL HIGH (ref 0.0–0.2)

## 2023-09-17 LAB — BASIC METABOLIC PANEL
Anion gap: 12 (ref 5–15)
Anion gap: 12 (ref 5–15)
BUN: 25 mg/dL — ABNORMAL HIGH (ref 8–23)
BUN: 25 mg/dL — ABNORMAL HIGH (ref 8–23)
CO2: 31 mmol/L (ref 22–32)
CO2: 30 mmol/L (ref 22–32)
Calcium: 8.8 mg/dL — ABNORMAL LOW (ref 8.9–10.3)
Calcium: 8.6 mg/dL — ABNORMAL LOW (ref 8.9–10.3)
Chloride: 90 mmol/L — ABNORMAL LOW (ref 98–111)
Chloride: 87 mmol/L — ABNORMAL LOW (ref 98–111)
Creatinine, Ser: 0.73 mg/dL (ref 0.61–1.24)
Creatinine, Ser: 0.63 mg/dL (ref 0.61–1.24)
GFR, Estimated: >60 mL/min (ref >60)
GFR, Estimated: >60 mL/min (ref >60)
Glucose, Bld: 119 mg/dL — ABNORMAL HIGH (ref 70–99)
Glucose, Bld: 253 mg/dL — ABNORMAL HIGH (ref 70–99)
Potassium: 5.5 mmol/L — ABNORMAL HIGH (ref 3.5–5.1)
Potassium: 5.1 mmol/L (ref 3.5–5.1)
Sodium: 133 mmol/L — ABNORMAL LOW (ref 135–145)
Sodium: 129 mmol/L — ABNORMAL LOW (ref 135–145)

## 2023-09-17 LAB — GLUCOSE, CAPILLARY
Glucose-Capillary: 92 mg/dL (ref 70–99)
Glucose-Capillary: 210 mg/dL — ABNORMAL HIGH (ref 70–99)
Glucose-Capillary: 120 mg/dL — ABNORMAL HIGH (ref 70–99)
Glucose-Capillary: 246 mg/dL — ABNORMAL HIGH (ref 70–99)
Glucose-Capillary: 229 mg/dL — ABNORMAL HIGH (ref 70–99)
Glucose-Capillary: 171 mg/dL — ABNORMAL HIGH (ref 70–99)

## 2023-09-17 LAB — APTT
aPTT: 46 s — ABNORMAL HIGH (ref 24–36)
aPTT: 54 s — ABNORMAL HIGH (ref 24–36)
aPTT: 83 s — ABNORMAL HIGH (ref 24–36)

## 2023-09-17 LAB — HEPARIN LEVEL (UNFRACTIONATED): Heparin Unfractionated: 0.61 [IU]/mL (ref 0.30–0.70)

## 2023-09-17 LAB — PHOSPHORUS: Phosphorus: 2.8 mg/dL (ref 2.5–4.6)

## 2023-09-17 LAB — MAGNESIUM: Magnesium: 2.5 mg/dL — ABNORMAL HIGH (ref 1.7–2.4)

## 2023-09-17 MED ORDER — LACTULOSE 10 GM/15ML PO SOLN
20.0000 g | Freq: Once | ORAL | Status: AC
  Administered 2023-09-17: 20 g
  Filled 2023-09-17: qty 30

## 2023-09-17 MED ORDER — FUROSEMIDE 10 MG/ML IJ SOLN
40.0000 mg | Freq: Once | INTRAMUSCULAR | Status: AC
Start: 2023-09-17 — End: 2023-09-17
  Administered 2023-09-17: 40 mg via INTRAVENOUS

## 2023-09-17 MED ORDER — METHYLPREDNISOLONE SODIUM SUCC 40 MG IJ SOLR
30.0000 mg | Freq: Every day | INTRAMUSCULAR | Status: DC
  Administered 2023-09-18 – 2023-09-19 (×2): 30 mg via INTRAVENOUS
  Filled 2023-09-17 (×2): qty 1

## 2023-09-17 MED ORDER — SODIUM ZIRCONIUM CYCLOSILICATE 5 G PO PACK
10.0000 g | PACK | Freq: Once | ORAL | Status: AC
  Administered 2023-09-17: 10 g
  Filled 2023-09-17: qty 2

## 2023-09-17 MED ORDER — ALBUTEROL SULFATE (2.5 MG/3ML) 0.083% IN NEBU
INHALATION_SOLUTION | RESPIRATORY_TRACT | Status: AC
  Administered 2023-09-17: 2.5 mg
  Filled 2023-09-17: qty 9

## 2023-09-17 MED ORDER — DEXTROSE 50 % IV SOLN
1.0000 | Freq: Once | INTRAVENOUS | Status: AC
  Administered 2023-09-17: 50 mL via INTRAVENOUS
  Filled 2023-09-17: qty 50

## 2023-09-17 MED ORDER — VITAL 1.5 CAL PO LIQD
1000.0000 mL | ORAL | Status: DC
  Administered 2023-09-17 – 2023-09-19 (×3): 1000 mL

## 2023-09-17 MED ORDER — HEPARIN BOLUS VIA INFUSION
1550.0000 [IU] | Freq: Once | INTRAVENOUS | Status: AC
  Administered 2023-09-17: 1550 [IU] via INTRAVENOUS
  Filled 2023-09-17: qty 1550

## 2023-09-17 MED ORDER — INSULIN ASPART 100 UNIT/ML IJ SOLN
4.0000 [IU] | INTRAMUSCULAR | Status: DC
  Administered 2023-09-17 (×3): 4 [IU] via SUBCUTANEOUS
  Filled 2023-09-17 (×3): qty 1

## 2023-09-17 MED ORDER — SODIUM ZIRCONIUM CYCLOSILICATE 5 G PO PACK
10.0000 g | PACK | Freq: Two times a day (BID) | ORAL | Status: AC
  Administered 2023-09-17: 10 g
  Filled 2023-09-17: qty 2

## 2023-09-17 MED ORDER — ALBUTEROL SULFATE (2.5 MG/3ML) 0.083% IN NEBU: 3.0000 mL | INHALATION_SOLUTION | RESPIRATORY_TRACT | Status: DC

## 2023-09-17 MED ORDER — FUROSEMIDE 10 MG/ML IJ SOLN
INTRAMUSCULAR | Status: AC
  Filled 2023-09-17: qty 4

## 2023-09-17 MED ORDER — PROSOURCE TF20 ENFIT COMPATIBL EN LIQD
60.0000 mL | Freq: Three times a day (TID) | ENTERAL | Status: DC
  Administered 2023-09-17 – 2023-09-24 (×20): 60 mL
  Filled 2023-09-17: qty 60

## 2023-09-17 MED ORDER — ALBUTEROL (5 MG/ML) CONTINUOUS INHALATION SOLN
7.5000 mg/h | INHALATION_SOLUTION | RESPIRATORY_TRACT | Status: DC
Start: 2023-09-17 — End: 2023-09-22
  Filled 2023-09-17: qty 20

## 2023-09-17 MED ORDER — INSULIN ASPART 100 UNIT/ML IJ SOLN
10.0000 [IU] | Freq: Once | INTRAMUSCULAR | Status: AC
  Administered 2023-09-17: 10 [IU] via INTRAVENOUS
  Filled 2023-09-17: qty 1

## 2023-09-17 MED ORDER — INSULIN ASPART 100 UNIT/ML IJ SOLN
5.0000 [IU] | INTRAMUSCULAR | Status: DC
  Administered 2023-09-17 – 2023-09-29 (×45): 5 [IU] via SUBCUTANEOUS
  Filled 2023-09-17 (×44): qty 1

## 2023-09-17 NOTE — TOC Progression Note (Signed)
 Transition of Care [facility]) - Progression Note    Patient Details  Name: Benjamin [facility] MRN: [id number] Date of Birth: 1960-10-08  [facility] CM/SW Contact  Loman Risk, RN Phone Number: 09/17/2023, 4:06 PM  Clinical Narrative:     Patient remains intubated Wife Vicky [phone]) was at bedside.  She states they do not live together, but they are legally married.  She is requesting assistance with medicaid [id number] form for CAP hours at home.  Informed her that she would need to go through DSS and that PCP or outpatient MD would need to complete.  Lina Render NP at bedside and confirms this information as well        Expected Discharge Plan and Services                                               Social Determinants of Health (SDOH) Interventions SDOH Screenings   Food Insecurity: No Food Insecurity (09/12/2023)  Recent Concern: Food Insecurity - Food Insecurity Present (08/25/2023)   Received from [facility]  Housing: Unknown (09/12/2023)  Transportation Needs: No Transportation Needs (09/12/2023)  Recent Concern: Transportation Needs - Unmet Transportation Needs (08/25/2023)   Received from [facility]  Utilities: Not At Risk (09/12/2023)  Recent Concern: Utilities - At Risk (07/23/2023)   Received from [facility]  Financial Resource Strain: Low Risk  (08/25/2023)   Received from [facility]  Recent Concern: Financial Resource Strain - Medium Risk (07/23/2023)   Received from [name]  Physical Activity: Insufficiently Active (08/25/2023)   Received from [facility]  Social Connections: Socially Isolated (08/25/2023)   Received from [facility]  Stress: No Stress Concern Present (08/25/2023)   Received from [facility]  Tobacco Use: Medium Risk (08/25/2023)   Received from [facility]  Health Literacy: Inadequate Health Literacy (08/25/2023)   Received from [facility]    Readmission Risk Interventions     No data to display

## 2023-09-17 NOTE — Consult Note (Signed)
 PHARMACY - ANTICOAGULATION CONSULT NOTE  Pharmacy Consult for IV Heparin  Indication:  Hx DVT (apixaban  on hold)  Patient Measurements: Height: 6' (182.9 cm) Weight: 59.2 kg (130 lb 8.2 oz) IBW/kg (Calculated) : 77.6 Heparin  Dosing Weight: 52.2 kg  Labs: Recent Labs    09/15/23 0439 09/15/23 0439 09/15/23 0833 09/15/23 1242 09/16/23 0225 09/16/23 0805 09/17/23 0412 09/17/23 1253 09/17/23 1529 09/17/23 1951  HGB 10.8*  --  10.8*  --  11.3*  --  10.5*  --   --   --   HCT 33.4*  --  31.8*  --  34.2*  --  32.2*  --   --   --   PLT 165  --  161  --  170  --  174  --   --   --   APTT 115*  --   --    < > 73*   < > 46* 54*  --  83*  HEPARINUNFRC 1.01*  --   --   --  0.71*  --  0.61  --   --   --   CREATININE  --    < > 0.82  --  0.82  --  0.73  --  0.63  --    < > = values in this interval not displayed.    Estimated Creatinine Clearance: 81.2 mL/min (by C-G formula based on SCr of 0.63 mg/dL).  Medical History: Past Medical History:  Diagnosis Date   COPD (chronic obstructive pulmonary disease) (HCC)    DVT (deep venous thrombosis) (HCC)    Dyspnea    GERD (gastroesophageal reflux disease)    no meds   HLD (hyperlipidemia)    Hx of migraines    Oxygen dependent    3 L [place]   Pneumonia 2023   Pre-diabetes    Pulmonary nodule    Rotator cuff tear, right    Tobacco abuse    Tobacco use     Medications:  Apixaban  5 mg BID, last dose 09/12/23 at 2206  Assessment: 63 y/o M with medical history as above and including hx DVT on apixaban  who is admitted with acute on chronic respiratory failure and now intubated. Pharmacy consulted for IV heparin .  Hgb trending down (13.3 > 11.6), unsure what may have caused this. Continue to monitor trend.    Date/Time aPTT (HL) Rate  Comment 1/11 1725 48  800 units/hr Subtherapeutic 1/12 0131 106  950 units/hr Supratherapeutic 1/12 0924 89 (HL > 1.1)  900 units/hr Therapeutic x 1 1/12 1546 100  900 units/hr Therapeutic x 2 1/12  2159 125  900 units/hr Supratherapeutic 1/13 0439 115 / 1.01 800 units/hr Supratherapeutic 1/13 1242 79 / --  700 units/hr Therapeutic x 1 1/13 2025 108 / --  700 units/hr Supratherapeutic 1/14 0225      73 / 0.71         650 units/hr     Therapeutic x 1 1/14 0805 51 / --  650 units/hr Subtherapeutic 1/14 1459 52 / --  750 units/hr Subtherapeutic 1/14 2223       66                    850 units/hr     Therapeutic x 1 1/15 0412       46 / 0.61          850 units/hr     SUBtherapeutic 1/15 1253 54 / --  1050 units/hr SUBtherapeutic 1/15 1951 83 / --  1200 units/hr Therapeutic x1  No issues with infusion, no signs/symptoms of bleeding.  Goal of Therapy:  aPTT 66 - 102 seconds Monitor platelets by anticoagulation protocol: Yes   Plan:  Continue heparin  infusion at 1200 units/hr  - will recheck aPTT 6 hrs - will recheck HL on 1/16 with AM labs - will use aPTT to guide dosing until correlating with HL  - HL and CBC with AM labs.   Thank you for involving pharmacy in this patient's care.   Ananias Balls Clinical Pharmacist 09/17/2023 8:30 PM

## 2023-09-17 NOTE — Progress Notes (Signed)
 NAME:  Benjamin Bolton, MRN:  [id number], DOB:  1961-02-17, LOS: 7 ADMISSION DATE:  09/10/2023, CONSULTATION DATE: 09/13/2023  Brief Pt Description / Synopsis:  63 y.o. 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   History of Present Illness:  Benjamin Bolton is a 63 year old male with a history of severe COPD followed by [facility] who presents to the hospital from [facility] on 1/8 with increased shortness of breath. He was admitted to the medicine service for management of COPD exacerbation and found to have an RSV infection.   Patient was admitted to [facility] and initiated on nebulizers, steroids, and antibiotics. He was managed medically for COPD exacerbation and UTI but developed worsening respiratory distress this AM requiring the initiation of BiPAP. Rapid response was called and he was transferred to the ICU. Initial blood gas showed pH of 7.2, with repeat VBG showing he remained acidemic at 7.18. Given altered mental status and respiratory failure, decision made at the bedside to proceed with emergent intubation.  Pertinent  Medical History  DVT on Eliquis  COPD on 2L O2 Type II Diabetes Mellitus  GERD HLD Pulmonary Nodule Tobacco Abuse   Micro Data:  1/8: SARS-CoV-2 PCR>>negative 1/8: Blood culture x2>> no growth 1/8: Urine>> multiple species (suggest recollection) 1/9: RVP>> + RSV 1/9: Sputum>> normal respiratory flora 1/9: HIV Screen>> non reactive 1/11: MRSA PCR>> negative  Antimicrobials:   Anti-infectives (From admission, onward)    Start     Dose/Rate Route Frequency Ordered Stop   09/13/23 1445  piperacillin -tazobactam (ZOSYN ) IVPB 3.375 g        3.375 g 12.5 mL/hr over 240 Minutes Intravenous Every 8 hours 09/13/23 1351 09/16/23 2359   09/10/23 2315   cefTRIAXone  (ROCEPHIN ) 1 g in sodium chloride  0.9 % 100 mL IVPB  Status:  Discontinued        1 g 200 mL/hr over 30 Minutes Intravenous Every 24 hours 09/10/23 2305 09/13/23 1336       Significant Hospital Events: Including procedures, antibiotic start and stop dates in addition to other pertinent events   1/08: admit to [facility] 1/11: rapid response, transfer to ICU, intubated 1/12: remains vented, sedated, does not follow commands.  Failed SBT  1/13: Overnight due to concerns of aspiration TF's held.  Will repeat SBT today 1/14: Overnight pt had possible seizure activity with rhythmic tremors noted in the mouth/lower face/neck during episode pt unresponsive to pain.  Received 2 mg of versed  and 2g keppra  bolus with resolution of symptoms.  CT Head negative.  Standing dose of keppra  1g bid.  Neurology consulted.    1/15: On minimal vent settings, unable to tolerate WUA due to increased WOB. Shifting measures given for Hyperkalemia of 5.5 with peaked T waves.  Abdomen distended, KUB without evidence of obstruction, give Lactulose .  Diurese with 40 mg IV Lasix  x1.  Interim History / Subjective:  -No significant events noted overnight -Afebrile, on 2 mcg Levophed  -On minimal vent support, plan for WUA/SBT as tolerated ~ with WUA, pt with increased WOB and tachycardia, not ready for SBT -CXR without significant change, discussed with Benjamin Bolton, will give 40 mg IV Lasix  x1 dose at pt is 5L positive -Shifting measures given for K of 5.5 (noted to have peaked T waves) ~ repeat BMP at 16:00  Objective  Blood pressure (!) 89/72, pulse 98, temperature 97.8 F (36.6 C), temperature source Axillary, resp. rate 15, height 6' (1.829 m), weight 59.2 kg, SpO2 97%.    Vent Mode: PCV FiO2 (%):  [30 %] 30 % Set Rate:  [12 bmp-18 bmp] 12 bmp Vt Set:  [480 mL] 480 mL PEEP:  [5 cmH20] 5 cmH20 Pressure Support:  [15 cmH20] 15 cmH20 Plateau Pressure:  [23 cmH20-26 cmH20] 23 cmH20   Intake/Output Summary  (Last 24 hours) at 09/17/2023 0745 Last data filed at 09/17/2023 0415 Gross per 24 hour  Intake 2199.91 ml  Output 475 ml  Net 1724.91 ml   Filed Weights   09/14/23 0430 09/16/23 0230 09/17/23 0330  Weight: 54.8 kg 57.5 kg 59.2 kg    Examination: General: Acutely-ill appearing male, NAD, mechanically intubated HENT: Supple, no JVD  Lungs: Coarse rhonchi with expiratory wheezes throughout, even, overbreathing the vent Cardiovascular: Sinus tachycardia, no m/r/g, 2+ radial/1+ distal, no edema  Abdomen: +BS x4, distended and taut, BS hypoactive  Extremities: Normal bulk and tone Neuro: Sedated, following commands, rhythmic tremors in the mouth/lower face/neck, PERRL GU: Indwelling foley catheter draining yellow urine with sediment   Resolved Hospital Problem list     Assessment & Plan:   #Acute toxic metabolic encephalopathy  #Mechanically intubation pain/discomfort  #Possible seizure activity  - Correct metabolic derangements  - Avoid sedating medications as able  - Maintain RASS goal 0 to -1 - PAD protocol to maintain RASS goal: Prn fentanyl  and propofol  gtts; will start precedex  gtt and attempt to wean propofol  gtt for SBT  - Scheduled valium  for anxiety/delirium  - WUA daily - Continue thiamine  and mvi  - EEG pending   - Keppra  was discontinued - Neurology consulted appreciate input  - Seizure precautions   #Mildly elevated troponin secondary to demand ischemia  #Hypotension suspect secondary to sedating medication Hx: HLD and DVT on Eliquis  Echo 09/14/23: 50 to 55%, indeterminate diastolic parameters - Continuous telemetry monitoring  - Troponin peaked at 115 - Prn levophed  gtt to maintain map 65 or higher  - Continue heparin  gtt: dosing per pharmacy  - Cardiology signed off no further cardiac evaluations indicated at this time   #Acute respiratory failure #AECOPD   #RSV Pneumonia  #Mechanical intubation  -Full vent support, implement lung protective  strategies -Plateau pressures less than 30 cm H20 -Wean FiO2 & PEEP as tolerated to maintain O2 sats 88 to 92% -Follow intermittent Chest X-ray & ABG as needed -Spontaneous Breathing Trials when respiratory parameters met and mental status permits -Implement VAP Bundle -Bronchodilators and Brovana  -Continue Solumedrol (weaned to 30 mg daily on 1/15) -Completed course of ABX as above  #Mild Hyperkalemia #Hyponatremia ~ IMPROVED #Hypophosphatemia ~ RESOLVED -Monitor I&O's / urinary output -Follow BMP -Ensure adequate renal perfusion -Avoid nephrotoxic agents as able -Replace electrolytes as indicated ~ Pharmacy following for assistance with electrolyte replacement -Give shifting measures: Albuterol , IV insulin  + D50, Lokelma  ~ follow up BMP @ 16:00 this evening  #Anemia without obvious signs of acute blood loss  -Monitor for S/Sx of bleeding -Trend CBC -Heparin  gtt for AC/VTE Prophylaxis  -Transfuse for Hgb <7  #Type II diabetes mellitus  #Hypoglycemia~resolved - CBG's q4hrs  - Continue resistance SSI and will start scheduled novolog  4 units q4hrs if TF's infusing  - Diabetes coordinator consulted appreciate input  - Follow hypo/hyperglycemic protocol  - Target CBG's 140 to 180     Pt is critically ill with AECOPD in the setting of RSV and  pneumonia.  Anticipate difficulty in liberating from mechanical ventilation.  May end up needing Tracheostomy.  Will consult Palliative Care to assist with goals of care conversations.   Best Practice (right click and "Reselect all SmartList Selections" daily)   Diet/type: TF's DVT prophylaxis systemic heparin  Pressure ulcer(s): N/A GI prophylaxis: PPI Lines: RUE PICC Line and still needed  Foley:  Yes, and it is still needed Code Status:  full code Last date of multidisciplinary goals of care discussion [09/17/2023]  01/15: Will update pts family today   Labs   CBC: Recent Labs  Lab 09/10/23 1312 09/11/23 0530 09/14/23 0421  09/15/23 0439 09/15/23 0833 09/16/23 0225 09/17/23 0412  WBC 14.2*   < > 11.6* 12.0* 13.3* 12.6* 11.6*  NEUTROABS 12.4*  --   --   --  11.0* 11.2* 9.7*  HGB 15.2   < > 11.6* 10.8* 10.8* 11.3* 10.5*  HCT 46.8   < > 36.4* 33.4* 31.8* 34.2* 32.2*  MCV 94.7   < > 96.3 94.6 93.5 92.9 95.3  PLT 242   < > 201 165 161 170 174   < > = values in this interval not displayed.    Basic Metabolic Panel: Recent Labs  Lab 09/13/23 1350 09/13/23 1725 09/14/23 0421 09/14/23 1552 09/15/23 0439 09/15/23 0833 09/16/23 0225 09/17/23 0412  NA 134*  --  133*  --   --  130* 130* 133*  K 4.5  --  4.2  --   --  4.2 4.9 5.5*  CL 98  --  94*  --   --  92* 92* 90*  CO2 23  --  27  --   --  24 27 31   GLUCOSE 196*  --  249*  --   --  69* 267* 119*  BUN 22  --  26*  --   --  18 21 25*  CREATININE 0.96  --  0.88  --   --  0.82 0.82 0.73  CALCIUM 8.5*  --  8.7*  --   --  8.4* 8.7* 8.8*  MG  --    < > 2.4 2.2 2.2  --  2.5* 2.5*  PHOS  --    < > 3.5 2.3* 2.2*  --  2.4* 2.8   < > = values in this interval not displayed.   GFR: Estimated Creatinine Clearance: 81.2 mL/min (by C-G formula based on SCr of 0.73 mg/dL). Recent Labs  Lab 09/10/23 1315 09/10/23 1535 09/10/23 1823 09/10/23 1939 09/11/23 0530 09/15/23 0439 09/15/23 0833 09/16/23 0225 09/17/23 0412  WBC  --   --   --   --    < > 12.0* 13.3* 12.6* 11.6*  LATICACIDVEN 1.6 2.7* 1.1 1.8  --   --   --   --   --    < > = values in this interval not displayed.    Liver Function Tests: Recent Labs  Lab 09/10/23 1312 09/13/23 1350  AST 20 31  ALT 20 24  ALKPHOS 79 76  BILITOT 1.0 0.6  PROT 7.1 6.2*  ALBUMIN  3.8 3.4*   No results for input(s): "LIPASE", "AMYLASE" in the last 168 hours. No results for input(s): "AMMONIA" in the last 168 hours.  ABG    Component Value Date/Time   PHART 7.43 09/16/2023 0816   PCO2ART 47 09/16/2023 0816   PO2ART 106 09/16/2023 0816   HCO3 31.2 (H) 09/16/2023 0816   O2SAT 100 09/16/2023 0816      Coagulation Profile:  Recent Labs  Lab 09/10/23 1312 09/13/23 1148  INR 1.2 1.1    Cardiac Enzymes: No results for input(s): "CKTOTAL", "CKMB", "CKMBINDEX", "TROPONINI" in the last 168 hours.  HbA1C: Hgb A1c MFr Bld  Date/Time Value Ref Range Status  09/15/2023 09:24 PM 6.4 (H) 4.8 - 5.6 % Final    Comment:    (NOTE) Pre diabetes:          5.7%-6.4%  Diabetes:              >6.4%  Glycemic control for   <7.0% adults with diabetes     CBG: Recent Labs  Lab 09/16/23 1655 09/16/23 1951 09/16/23 2351 09/17/23 0326 09/17/23 0728  GLUCAP 233* 231* 138* 92 210*    Review of Systems:   Unable to assess pt mechanically intubated   Past Medical History:  He,  has a past medical history of COPD (chronic obstructive pulmonary disease) (HCC), DVT (deep venous thrombosis) (HCC), Dyspnea, GERD (gastroesophageal reflux disease), HLD (hyperlipidemia), migraines, Oxygen dependent, Pneumonia (2023), Pre-diabetes, Pulmonary nodule, Rotator cuff tear, right, Tobacco abuse, and Tobacco use.   Surgical History:   Past Surgical History:  Procedure Laterality Date   CHEST TUBE INSERTION     SHOULDER ARTHROSCOPY WITH SUBACROMIAL DECOMPRESSION, ROTATOR CUFF REPAIR AND BICEP TENDON REPAIR Right 02/18/2023   Procedure: RIGHT SHOULDER ARTHROSCOPY WITH DEBRIDEMENT, DECOMPRESSION, ROTATOR CUFF REPAIR, BICEPS TENODESIS.;  Surgeon: Elner Hahn, MD;  Location: [facility] ORS;  Service: Orthopedics;  Laterality: Right;     Social History:   reports that he has been smoking cigarettes. He has a 12 pack-year smoking history. He has never used smokeless tobacco. He reports current alcohol  use. He reports that he does not use drugs.   Family History:  His family history is not on file.   Allergies No Active Allergies   Home Medications  Prior to Admission medications   Medication Sig Start Date End Date Taking? Authorizing Provider  albuterol  (PROVENTIL ) (2.5 MG/3ML) 0.083% nebulizer solution  USE 1 VIAL IN NEBULIZER EVERY 6 HOURS AS NEEDED FOR WHEEZING 06/28/21  Yes [provider]  albuterol  (VENTOLIN  HFA) 108 (90 Base) MCG/ACT inhaler Inhale 1 puff into the lungs every 4 (four) hours as needed for wheezing or shortness of breath.   Yes [provider]  APIXABAN  (ELIQUIS ) VTE STARTER PACK (10MG  AND 5MG ) Take as directed on package: start with two-5mg  tablets twice daily for 7 days. On day 8, switch to one-5mg  tablet twice daily. 02/26/23  Yes Bradler, Evan K, MD  ondansetron  (ZOFRAN ) 4 MG tablet Take 1 tablet (4 mg total) by mouth every 6 (six) hours as needed for nausea. 02/19/23  Yes Rojelio Clement, PA-C  oxyCODONE  (OXY IR/ROXICODONE ) 5 MG immediate release tablet Take 1 tablet by mouth 2 (two) times daily as needed. 08/29/23  Yes [provider]  OXYGEN Inhale 3 L into the lungs continuous.   Yes [provider]  predniSONE  (DELTASONE ) 10 MG tablet Take 2-6 tablets by mouth daily with breakfast.  ORIGINAL ZOX:WRUE 6 tabs daily x4 days; then 4 tabs daily x4 days; then 2 tabs daily x4 days.   Yes [provider]  predniSONE  (DELTASONE ) 20 MG tablet Take 20 mg by mouth daily with breakfast.   Yes [provider]  TRELEGY ELLIPTA 100-62.5-25 MCG/ACT AEPB Inhale 1 puff into the lungs every morning. 07/04/21  Yes [provider]  amoxicillin-clavulanate (AUGMENTIN) 875-125 MG tablet Take 1 tablet by mouth every 12 (twelve) hours. Patient not taking:  Reported on 09/10/2023    [provider]  benralizumab (FASENRA PEN) 30 MG/ML prefilled autoinjector Inject 30 mg into the skin as directed. Patient not taking: Reported on 09/10/2023 07/23/23   [provider]  nystatin (MYCOSTATIN) 100000 UNIT/ML suspension Take 5 mLs by mouth 4 (four) times daily. Patient not taking: Reported on 09/10/2023 09/04/23   [provider]  omeprazole (PRILOSEC) 20 MG capsule Take 20 mg by mouth daily. Patient not taking: Reported  on 09/10/2023    [provider]  oxyCODONE  (OXY IR/ROXICODONE ) 5 MG immediate release tablet Take 1-2 tablets (5-10 mg total) by mouth every 4 (four) hours as needed for severe pain. 02/19/23   Rojelio Clement, PA-C     Critical care time: 40 minutes      Cherylann Corpus, AGACNP-BC [facility] Prefer epic messenger for cross cover needs If after hours, please call E-link

## 2023-09-17 NOTE — Consult Note (Signed)
 PHARMACY - ANTICOAGULATION CONSULT NOTE  Pharmacy Consult for IV Heparin  Indication:  Hx DVT (apixaban  on hold)  Patient Measurements: Height: 6' (182.9 cm) Weight: 59.2 kg (130 lb 8.2 oz) IBW/kg (Calculated) : 77.6 Heparin  Dosing Weight: 52.2 kg  Labs: Recent Labs    09/15/23 0439 09/15/23 0833 09/15/23 1242 09/16/23 0225 09/16/23 0805 09/16/23 2223 09/17/23 0412 09/17/23 1253  HGB 10.8* 10.8*  --  11.3*  --   --  10.5*  --   HCT 33.4* 31.8*  --  34.2*  --   --  32.2*  --   PLT 165 161  --  170  --   --  174  --   APTT 115*  --    < > 73*   < > 66* 46* 54*  HEPARINUNFRC 1.01*  --   --  0.71*  --   --  0.61  --   CREATININE  --  0.82  --  0.82  --   --  0.73  --    < > = values in this interval not displayed.    Estimated Creatinine Clearance: 81.2 mL/min (by C-G formula based on SCr of 0.73 mg/dL).  Medical History: Past Medical History:  Diagnosis Date   COPD (chronic obstructive pulmonary disease) (HCC)    DVT (deep venous thrombosis) (HCC)    Dyspnea    GERD (gastroesophageal reflux disease)    no meds   HLD (hyperlipidemia)    Hx of migraines    Oxygen dependent    3 L [place]   Pneumonia 2023   Pre-diabetes    Pulmonary nodule    Rotator cuff tear, right    Tobacco abuse    Tobacco use     Medications:  Apixaban  5 mg BID, last dose 09/12/23 at 2206  Assessment: 63 y/o M with medical history as above and including hx DVT on apixaban  who is admitted with acute on chronic respiratory failure and now intubated. Pharmacy consulted for IV heparin .  Hgb trending down (13.3 > 11.6), unsure what may have caused this. Continue to monitor trend.    Date/Time aPTT (HL) Rate  Comment 1/11 1725 48  800 units/hr Subtherapeutic 1/12 0131 106  950 units/hr Supratherapeutic 1/12 0924 89 (HL > 1.1)  900 units/hr Therapeutic x 1 1/12 1546 100  900 units/hr Therapeutic x 2 1/12 2159 125  900 units/hr Supratherapeutic 1/13 0439 115 / 1.01 800  units/hr Supratherapeutic 1/13 1242 79 / --  700 units/hr Therapeutic x 1 1/13 2025 108 / --  700 units/hr Supratherapeutic 1/14 0225      73 / 0.71         650 units/hr     Therapeutic x 1 1/14 0805 51 / --  650 units/hr Subtherapeutic 1/14 1459 52 / --  750 units/hr Subtherapeutic 1/14 2223       66                    850 units/hr     Therapeutic x 1 1/15 0412       46 / 0.61          850 units/hr     SUBtherapeutic 1/15 1253 54 / --  1050 units/hr SUBtherapeutic  Per RN, no issues with infusion, no signs/symptoms of bleeding.  Goal of Therapy:  aPTT 66 - 102 seconds Monitor platelets by anticoagulation protocol: Yes   Plan:  - aPTT SUBtherapeutic - will order heparin  1550 units IV X  1 bolus and increase drip rate to 1200 units/hr  - will recheck aPTT 6 hrs after rate change - will recheck HL on 1/16 with AM labs - will use aPTT to guide dosing until correlating with HL  - HL and CBC with AM labs.   Thank you for involving pharmacy in this patient's care.   Aurie Harroun A Izaih Kataoka Clinical Pharmacist 09/17/2023 1:23 PM

## 2023-09-17 NOTE — Progress Notes (Signed)
 Wiley Hanger notified of distended firm abdomen, KUB to be ordered.

## 2023-09-17 NOTE — Progress Notes (Signed)
 Patient appears comfortable with Fentanyl  and propofol  restarted at previous rate.

## 2023-09-17 NOTE — Progress Notes (Signed)
 Pt's wife updated at bedside on plan of care.  All questions answered, she is appreciative of update.     Cherylann Corpus, AGACNP-BC [facility] Prefer epic messenger for cross cover needs If after hours, please call E-link

## 2023-09-17 NOTE — Progress Notes (Signed)
 Nutrition Follow Up Note   DOCUMENTATION CODES:   Non-severe (moderate) malnutrition in context of chronic illness  INTERVENTION:   Vital 1.5@50ml /hr- Initiate at 73ml/hr, once distension improves, increase by 10ml/hr q 8 hours until goal rate is reached.   ProSource TF 20- Give 60ml TID via tube until goal rate reached, then give daily- each supplement provides 80kcal and 20g of protein.   Free water  flushes 30ml q4 hours to maintain tube patency   Current regimen provides 1307kcal/day, 93g/day protein and 576ml/day of free water .   Propofol : 13.15 ml/hr- provides 347kcal/day   Pt remains at high refeed risk; recommend monitor potassium, magnesium  and phosphorus labs daily until stable  Daily weights   NUTRITION DIAGNOSIS:   Moderate Malnutrition related to chronic illness (pt sedated and ventilated) as evidenced by moderate fat depletion, moderate muscle depletion. -ongoing   GOAL:   Provide needs based on ASPEN/SCCM guidelines -met   MONITOR:   Vent status, Labs, Weight trends, TF tolerance, Skin, I & O's  ASSESSMENT:   63 y/o male with h/o COPD, DVT, HLD, GERD, pulmonary nodule and DM who is admitted with RSV, pneumonia and COPD exacerbation.  Pt remains sedated and ventilated. OGT in place. Pt has been tolerating tube feeds at goal rate. Pt with hyperkalemia today. Tube feeds being held today for SBTs. Pt with abdominal distension. KUB within normal limits. Pt noted to have had a BM yesterday. Lactulose  given today. Will decrease tube feeds to trickle rate until distension improves. Pt remains at refeed risk. Per chart, pt is up ~15lbs since admission but appears to be back at his UBW. Pt +4.8L on her I & Os.    Medications reviewed and include: colace, pepcid , heparin , insulin , solu-medrol , MVI, miralax , lokelma , levophed , propofol     Labs reviewed: Na 133(L), K 5.5(H), BUN 25(H), P 2.8 wnl, Mg 2.5(H) Wbc- 11.6(H), Hgb 10.5(L), Hct 32.2(L) Cbgs- 120, 210, 92 x 24  hrs   Patient is currently intubated on ventilator support MV: 6.0 L/min Temp (24hrs), Avg:98.3 F (36.8 C), Min:97.8 F (36.6 C), Max:98.8 F (37.1 C)  Propofol : 13.15 ml/hr- provides 347kcal/day   MAP >4mmHg   UOP-   Diet Order:   Diet Order             Diet NPO time specified  Diet effective now                  EDUCATION NEEDS:   No education needs have been identified at this time  Skin:  Skin Assessment: Reviewed RN Assessment  Last BM:  1/14  Height:   Ht Readings from Last 1 Encounters:  09/10/23 6' (1.829 m)    Weight:   Wt Readings from Last 1 Encounters:  09/17/23 59.2 kg    Ideal Body Weight:  80.9 kg  BMI:  Body mass index is 17.7 kg/m.  Estimated Nutritional Needs:   Kcal:  1734kcal/day  Protein:  90-100g/day  Fluid:  1.7-1.9L/day  Torrance Freestone MS, RD, LDN If unable to be reached, please send secure chat to "RD inpatient" available from 8:00a-4:00p daily

## 2023-09-17 NOTE — Progress Notes (Signed)
 10:30 Fentanyl  and versed  stopped for wake up assessment and possible SBT trial.  10:50 Patient following simple commands, raising arms and wiggling toes on command.  HR increased to 120's, increased work of breathing noted, RR increased to 22-24 breath per minute.  Dr. Lucina Sabal and Cherylann Corpus, NP notified and requested that we not SBT patient and re-sedate patient.

## 2023-09-17 NOTE — Consult Note (Signed)
 PHARMACY - ANTICOAGULATION CONSULT NOTE  Pharmacy Consult for IV Heparin  Indication:  Hx DVT (apixaban  on hold)  Patient Measurements: Height: 6' (182.9 cm) Weight: 59.2 kg (130 lb 8.2 oz) IBW/kg (Calculated) : 77.6 Heparin  Dosing Weight: 52.2 kg  Labs: Recent Labs    09/15/23 0439 09/15/23 0833 09/15/23 1242 09/16/23 0225 09/16/23 0805 09/16/23 1459 09/16/23 2223 09/17/23 0412  HGB 10.8* 10.8*  --  11.3*  --   --   --  10.5*  HCT 33.4* 31.8*  --  34.2*  --   --   --  32.2*  PLT 165 161  --  170  --   --   --  174  APTT 115*  --    < > 73*   < > 52* 66* 46*  HEPARINUNFRC 1.01*  --   --  0.71*  --   --   --  0.61  CREATININE  --  0.82  --  0.82  --   --   --  0.73   < > = values in this interval not displayed.    Estimated Creatinine Clearance: 81.2 mL/min (by C-G formula based on SCr of 0.73 mg/dL).  Medical History: Past Medical History:  Diagnosis Date   COPD (chronic obstructive pulmonary disease) (HCC)    DVT (deep venous thrombosis) (HCC)    Dyspnea    GERD (gastroesophageal reflux disease)    no meds   HLD (hyperlipidemia)    Hx of migraines    Oxygen dependent    3 L [place]   Pneumonia 2023   Pre-diabetes    Pulmonary nodule    Rotator cuff tear, right    Tobacco abuse    Tobacco use     Medications:  Apixaban  5 mg BID, last dose 09/12/23 at 2206  Assessment: 63 y/o M with medical history as above and including hx DVT on apixaban  who is admitted with acute on chronic respiratory failure and now intubated. Pharmacy consulted for IV heparin .  Hgb trending down (13.3 > 11.6), unsure what may have caused this. Continue to monitor trend.    Date/Time aPTT (HL) Rate  Comment 1/11 1725 48  800 units/hr Subtherapeutic 1/12 0131 106  950 units/hr Supratherapeutic 1/12 0924 89 (HL > 1.1)  900 units/hr Therapeutic x 1 1/12 1546 100  900 units/hr Therapeutic x 2 1/12 2159 125  900 units/hr Supratherapeutic 1/13 0439 115 / 1.01 800  units/hr Supratherapeutic 1/13 1242 79 / --  700 units/hr Therapeutic x 1 1/13 2025 108 / --  700 units/hr Supratherapeutic 1/14 0225      73 / 0.71         650 units/hr     Therapeutic x 1 1/14 0805 51 / --  650 units/hr Subtherapeutic 1/14 1459 52 / --  750 units/hr Subtherapeutic 1/14 2223       66                    850 units/hr     Therapeutic x 1 1/15 0412       46 / 0.61          850 units/hr     SUBtherapeutic  Per RN, no issues with infusion, no signs/symptoms of bleeding.  Goal of Therapy:  aPTT 66 - 102 seconds Monitor platelets by anticoagulation protocol: Yes   Plan:  1/15 @ 0412:  aPTT = 46,  HL = 0.61 - aPTT SUBtherapeutic,  not correlating with HL (therapeutic) -  will order heparin  1550 units IV X 1 bolus and increase drip rate to 1050 units/hr  - will recheck aPTT 6 hrs after rate change - will recheck HL on 1/16 with AM labs - will use aPTT to guide dosing until correlating with HL  - HL and CBC with AM labs.   Thank you for involving pharmacy in this patient's care.   Sheneika Walstad D Clinical Pharmacist 09/17/2023 6:54 AM

## 2023-09-18 ENCOUNTER — Other Ambulatory Visit: Payer: Self-pay

## 2023-09-18 DIAGNOSIS — R748 Abnormal levels of other serum enzymes: Secondary | ICD-10-CM | POA: Diagnosis not present

## 2023-09-18 DIAGNOSIS — J441 Chronic obstructive pulmonary disease with (acute) exacerbation: Secondary | ICD-10-CM | POA: Diagnosis not present

## 2023-09-18 DIAGNOSIS — Z515 Encounter for palliative care: Secondary | ICD-10-CM | POA: Diagnosis not present

## 2023-09-18 DIAGNOSIS — Z720 Tobacco use: Secondary | ICD-10-CM | POA: Diagnosis not present

## 2023-09-18 LAB — CBC
HCT: 29.7 % — ABNORMAL LOW (ref 39.0–52.0)
Hemoglobin: 9.5 g/dL — ABNORMAL LOW (ref 13.0–17.0)
MCH: 30.5 pg (ref 26.0–34.0)
MCHC: 32 g/dL (ref 30.0–36.0)
MCV: 95.5 fL (ref 80.0–100.0)
Platelets: 194 10*3/uL (ref 150–400)
RBC: 3.11 MIL/uL — ABNORMAL LOW (ref 4.22–5.81)
RDW: 14.6 % (ref 11.5–15.5)
WBC: 10.3 10*3/uL (ref 4.0–10.5)
nRBC: 1.3 % — ABNORMAL HIGH (ref 0.0–0.2)

## 2023-09-18 LAB — RENAL FUNCTION PANEL
Albumin: 2.6 g/dL — ABNORMAL LOW (ref 3.5–5.0)
Anion gap: 8 (ref 5–15)
BUN: 25 mg/dL — ABNORMAL HIGH (ref 8–23)
CO2: 35 mmol/L — ABNORMAL HIGH (ref 22–32)
Calcium: 9 mg/dL (ref 8.9–10.3)
Chloride: 87 mmol/L — ABNORMAL LOW (ref 98–111)
Creatinine, Ser: 0.52 mg/dL — ABNORMAL LOW (ref 0.61–1.24)
GFR, Estimated: >60 mL/min (ref >60)
Glucose, Bld: 120 mg/dL — ABNORMAL HIGH (ref 70–99)
Phosphorus: 2.9 mg/dL (ref 2.5–4.6)
Potassium: 4.8 mmol/L (ref 3.5–5.1)
Sodium: 130 mmol/L — ABNORMAL LOW (ref 135–145)

## 2023-09-18 LAB — GLUCOSE, CAPILLARY
Glucose-Capillary: 105 mg/dL — ABNORMAL HIGH (ref 70–99)
Glucose-Capillary: 91 mg/dL (ref 70–99)
Glucose-Capillary: 116 mg/dL — ABNORMAL HIGH (ref 70–99)
Glucose-Capillary: 185 mg/dL — ABNORMAL HIGH (ref 70–99)
Glucose-Capillary: 124 mg/dL — ABNORMAL HIGH (ref 70–99)
Glucose-Capillary: 92 mg/dL (ref 70–99)
Glucose-Capillary: 96 mg/dL (ref 70–99)

## 2023-09-18 LAB — APTT: aPTT: 82 s — ABNORMAL HIGH (ref 24–36)

## 2023-09-18 LAB — TRIGLYCERIDES: Triglycerides: 122 mg/dL (ref <150)

## 2023-09-18 LAB — HEPARIN LEVEL (UNFRACTIONATED): Heparin Unfractionated: 0.84 [IU]/mL — ABNORMAL HIGH (ref 0.30–0.70)

## 2023-09-18 LAB — MAGNESIUM: Magnesium: 2.4 mg/dL (ref 1.7–2.4)

## 2023-09-18 MED ORDER — QUETIAPINE FUMARATE 25 MG PO TABS
25.0000 mg | ORAL_TABLET | Freq: Two times a day (BID) | ORAL | Status: DC
Start: 2023-09-18 — End: 2023-09-19
  Administered 2023-09-18 – 2023-09-19 (×3): 25 mg
  Filled 2023-09-18 (×3): qty 1

## 2023-09-18 NOTE — Progress Notes (Signed)
09:43 Fentanyl and propofol turned off for wake up assessment and possible SBT trial. 10:10 Pt restless, unable to follow commands, raising arms. Pt tachycardic, tachypneic. Jeri Modena NP notified and requested to re-sedate pt. Fentanyl and prop restarted. PRN fentanyl bolus given.

## 2023-09-18 NOTE — Consult Note (Signed)
PHARMACY - ANTICOAGULATION CONSULT NOTE  Pharmacy Consult for IV Heparin Indication:  Hx DVT (apixaban on hold)  Patient Measurements: Height: 6' (182.9 cm) Weight: 59.2 kg (130 lb 8.2 oz) IBW/kg (Calculated) : 77.6 Heparin Dosing Weight: 52.2 kg  Labs: Recent Labs    09/16/23 0225 09/16/23 0805 09/17/23 0412 09/17/23 1253 09/17/23 1529 09/17/23 1951 09/18/23 0155  HGB 11.3*  --  10.5*  --   --   --  9.5*  HCT 34.2*  --  32.2*  --   --   --  29.7*  PLT 170  --  174  --   --   --  194  APTT 73*   < > 46* 54*  --  83* 82*  HEPARINUNFRC 0.71*  --  0.61  --   --   --  0.84*  CREATININE 0.82  --  0.73  --  0.63  --   --    < > = values in this interval not displayed.    Estimated Creatinine Clearance: 81.2 mL/min (by C-G formula based on SCr of 0.63 mg/dL).  Medical History: Past Medical History:  Diagnosis Date   COPD (chronic obstructive pulmonary disease) (HCC)    DVT (deep venous thrombosis) (HCC)    Dyspnea    GERD (gastroesophageal reflux disease)    no meds   HLD (hyperlipidemia)    Hx of migraines    Oxygen dependent    3 L    Pneumonia 2023   Pre-diabetes    Pulmonary nodule    Rotator cuff tear, right    Tobacco abuse    Tobacco use     Medications:  Apixaban 5 mg BID, last dose 09/12/23 at 2206  Assessment: 63 y/o M with medical history as above and including hx DVT on apixaban who is admitted with acute on chronic respiratory failure and now intubated. Pharmacy consulted for IV heparin.  Hgb trending down (13.3 > 11.6), unsure what may have caused this. Continue to monitor trend.    Date/Time aPTT (HL) Rate  Comment 1/11 1725 48  800 units/hr Subtherapeutic 1/12 0131 106  950 units/hr Supratherapeutic 1/12 0924 89 (HL > 1.1)  900 units/hr Therapeutic x 1 1/12 1546 100  900 units/hr Therapeutic x 2 1/12 2159 125  900 units/hr Supratherapeutic 1/13 0439 115 / 1.01 800 units/hr Supratherapeutic 1/13 1242 79 / --  700 units/hr Therapeutic x  1 1/13 2025 108 / --  700 units/hr Supratherapeutic 1/14 0225      73 / 0.71         650 units/hr     Therapeutic x 1 1/14 0805 51 / --  650 units/hr Subtherapeutic 1/14 1459 52 / --  750 units/hr Subtherapeutic 1/14 2223       66                    850 units/hr     Therapeutic x 1 1/15 0412       46 / 0.61          850 units/hr     SUBtherapeutic 1/15 1253 54 / --  1050 units/hr SUBtherapeutic 1/15 1951 83 / --  1200 units/hr Therapeutic x1 1/16 0155       82 / 0.84          1200 units/hr   Therapeutic X 2  No issues with infusion, no signs/symptoms of bleeding.  Goal of Therapy:  aPTT 66 - 102 seconds Monitor  platelets by anticoagulation protocol: Yes   Plan:  1/16 @ 0155:  aPTT = 82,   HL = 0.84 - aPTT therapeutic X 2,  HL slightly elevated - will switch to HL monitoring to guide dosing - Continue heparin infusion at 1200 units/hr  - will recheck HL on 1/17 with AM labs - HL and CBC with AM labs.   Thank you for involving pharmacy in this patient's care.   Trey Gulbranson D Clinical Pharmacist 09/18/2023 2:52 AM

## 2023-09-18 NOTE — Progress Notes (Deleted)
     Referral received for Benjamin Bolton re: goals of care discussion. Chart review completed prior to meeting patient including labs, vital signs, imaging, progress notes, orders, and available advanced directive documents from current and previous encounters.  Updates received from [employer]. Patient assessed. Sedation is turned off during my visit.  Wake up assessment/SBT  performed.  Patient becomes agitated during my visit, attempting to remove tubing.  Patient suctioned in mouth and deep suction during my visit.  He was able to open his eyes, unable to track me, and unable to engage in full wake-up assessment during my visit.  Patient unable to engage appropriately in goals of care or medical decision making at this time.  I requested that should family or friends come bedside that nursing staff, when able, contact me as I am available today to meet with patient's family to discuss goals of care.  After visiting with the patient and receiving updates from RN, I attempted to contact patient's wife Benjamin Bolton. There was no answer and I left a HIPPA compliant voicemail with PMT contact information.   PMT will re-attempt to contact family at a later time/date. Detailed note and recommendations to follow once GOC has been completed.   Thank you for your referral and allowing PMT to assist in [facility] care.   Georgiann Cocker, FNP-BC Palliative Medicine Team   NO CHARGE

## 2023-09-18 NOTE — Progress Notes (Signed)
Nutrition Follow Up Note   DOCUMENTATION CODES:   Non-severe (moderate) malnutrition in context of chronic illness  INTERVENTION:   Once appropriate to resume tube feeds:  Vital 1.2@55ml /hr- Initiate at 21ml/hr and increase by 53ml/hr q 8 hours until goal rate is reached.   Free water flushes 30ml q4 hours to maintain tube patency   Regimen provides 1931kcal/day, 99g/day protein and 1220ml/day of free water.   Propofol: 13.15 ml/hr- provides 347kcal/day   Pt remains at high refeed risk; recommend monitor potassium, magnesium and phosphorus labs daily until stable  Daily weights   NUTRITION DIAGNOSIS:   Moderate Malnutrition related to chronic illness (pt sedated and ventilated) as evidenced by moderate fat depletion, moderate muscle depletion. -ongoing   GOAL:   Provide needs based on ASPEN/SCCM guidelines -previously met with tube feeds  MONITOR:   Vent status, Labs, Weight trends, TF tolerance, Skin, I & O's  ASSESSMENT:   63 y/o male with h/o COPD, DVT, HLD, GERD, pulmonary nodule and DM who is admitted with RSV, pneumonia and COPD exacerbation.  Pt remains sedated and ventilated. OGT in place. Abdomen appears less distended and taut; tap water enema given today. KUB from yesterday with no significant findings. Pt tolerating trickle feeds this morning; tube feeds now on hold until bowel function returns. Last BM 1/14. Pt unable to be extubated. Per chart, pt is up ~12lbs since admission but appears to be back at his UBW. Pt +4.7L on his I & Os.   Medications reviewed and include: colace, pepcid, insulin, solu-medrol, MVI, miralax, heparin, levophed, propofol     Labs reviewed: Na 130(L), BUN 25(H), creat 0.52(L), K 2.9 wnl, Mg 2.4 wnl Hgb 9.5(L), Hct 29.7(L) Cbgs- 116, 91, 105 x 24 hrs   Patient is currently intubated on ventilator support MV: 5.4 L/min Temp (24hrs), Avg:98.5 F (36.9 C), Min:98 F (36.7 C), Max:99.2 F (37.3 C)  Propofol: 13.15 ml/hr-  provides 347kcal/day   MAP >57mmHg   UOP-   Diet Order:   Diet Order             Diet NPO time specified  Diet effective now                  EDUCATION NEEDS:   No education needs have been identified at this time  Skin:  Skin Assessment: Reviewed RN Assessment  Last BM:  1/14- small  Height:   Ht Readings from Last 1 Encounters:  09/10/23 6' (1.829 m)    Weight:   Wt Readings from Last 1 Encounters:  09/18/23 58 kg    Ideal Body Weight:  80.9 kg  BMI:  Body mass index is 17.34 kg/m.  Estimated Nutritional Needs:   Kcal:  1734kcal/day  Protein:  90-100g/day  Fluid:  1.7-1.9L/day  Betsey Holiday MS, RD, LDN If unable to be reached, please send secure chat to "RD inpatient" available from 8:00a-4:00p daily

## 2023-09-18 NOTE — Plan of Care (Signed)
  Problem: Clinical Measurements: Goal: Diagnostic test results will improve Outcome: Progressing   Problem: Nutrition: Goal: Adequate nutrition will be maintained Outcome: Progressing   Problem: Coping: Goal: Level of anxiety will decrease Outcome: Progressing

## 2023-09-18 NOTE — Consult Note (Signed)
Consultation Note Date: 09/18/2023 at 1000  Patient Name: Benjamin Bolton  DOB: [birth year]  MRN: [id number]  Age / Sex: 63 y.o., male  PCP: [facility] Referring Physician: Janann Colonel, MD  HPI/Patient Profile: 63 y.o. male  with past medical history of 2 diabetes, GERD, HLD, tobacco use disorder, COPD stage IV (2-3L [place] at home), DVT (Eliquis) admitted on 09/10/2023 with acute on chronic hypoxic and hypercapnic respiratory failure in the setting of AECOPD with RSV pneumonia.  1/11, rapid response, transferred to ICU, patient was intubated and sedated..  1/12, remains vented, sedated, does not follow commands.  Failed SBT  1/13, overnight due to concerns of aspiration TF's held.  Will repeat SBT today 1/14, overnight pt had possible seizure activity with rhythmic tremors noted in the mouth/lower face/neck during episode pt unresponsive to pain.  Received 2 mg of versed and 2g keppra bolus with resolution of symptoms.  CT Head negative.  Standing dose of keppra 1g bid.  Neurology consulted.    1/15, on minimal vent settings, unable to tolerate WUA due to increased WOB. Shifting measures given for Hyperkalemia of 5.5 with peaked T waves.  Abdomen distended, KUB without evidence of obstruction, give Lactulose.  Diurese with 40 mg IV Lasix x1. 1/16, Failed WUA, with increased WOB and hypoxic with O2 sats dropping to mid 70's.  Add Seroquel.    Palliative Care consulted to assist with GOC.  Clinical Assessment and Goals of Care: Referral received for Benjamin Bolton re: goals of care discussion. Chart review completed prior to meeting patient including labs, vital signs, imaging, progress notes, orders, and available advanced directive documents from current and previous encounters.   Updates received from RN Alcario Drought while I was at bedside. Patient assessed. Sedation is turned off during my visit.  Wake up  assessment/SBT  performed.  Patient becomes agitated during my visit, attempting to remove tubing.  Patient suctioned during my visit with increased agitation, but gag reflex present.  He was able to open his eyes, unable to track me, and unable to engage in full wake-up assessment during my visit.   Patient unable to engage appropriately in goals of care or medical decision making at this time.   I requested that should family or friends come bedside that nursing staff, when able, contact me as I am available today to meet with patient's family to discuss goals of care.   After visiting with the patient and receiving updates from RN, I attempted to contact patient's wife Benjamin Bolton. There was no answer and I left a HIPPA compliant voicemail with PMT contact information.    PMT will re-attempt to contact family at a later time/date. Detailed note and recommendations to follow once GOC has been completed.    Thank you for your referral and allowing PMT to assist in [facility] care.    Georgiann Cocker, FNP-BC Palliative Medicine Team   Primary Decision Maker NEXT OF [facility]  Physical Exam Vitals reviewed.  Constitutional:      General: He is not  in acute distress.    Appearance: He is normal weight.  HENT:     Head: Normocephalic.  Cardiovascular:     Rate and Rhythm: Normal rate.  Pulmonary:     Comments: ventilated Skin:    General: Skin is warm and dry.  Psychiatric:        Mood and Affect: Mood is not anxious.        Behavior: Behavior is agitated.     Palliative Assessment/Data: 30%     Thank you for this consult. Palliative medicine will continue to follow and assist holistically.   Time Total: 40 minutes  Time spent includes: Detailed review of medical records (labs, imaging, vital signs), medically appropriate exam (mental status, respiratory, cardiac, skin), discussed with treatment team, counseling and educating patient, family and staff, documenting clinical  information, medication management and coordination of care.  Signed by: Georgiann Cocker, DNP, FNP-BC Palliative Medicine   Please contact Palliative Medicine Team providers via [facility] for questions and concerns.

## 2023-09-18 NOTE — Progress Notes (Addendum)
NAME:  Benjamin Bolton, MRN:  [id number], DOB:  May 08, 1961, LOS: 8 ADMISSION DATE:  09/10/2023, CONSULTATION DATE: 09/13/2023  Brief Pt Description / Synopsis:  63 y.o. 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   History of Present Illness:  Benjamin Bolton is a 63 year old male with a history of severe COPD followed by [facility] who presents to the hospital from [facility] clinic on 1/8 with increased shortness of breath. He was admitted to the medicine service for management of COPD exacerbation and found to have an RSV infection.   Patient was admitted to [facility] and initiated on nebulizers, steroids, and antibiotics. He was managed medically for COPD exacerbation and UTI but developed worsening respiratory distress this AM requiring the initiation of BiPAP. Rapid response was called and he was transferred to the ICU. Initial blood gas showed pH of 7.2, with repeat VBG showing he remained acidemic at 7.18. Given altered mental status and respiratory failure, decision made at the bedside to proceed with emergent intubation.  Pertinent  Medical History  DVT on Eliquis COPD on 2L O2 Type II Diabetes Mellitus  GERD HLD Pulmonary Nodule Tobacco Abuse   Micro Data:  1/8: SARS-CoV-2 PCR>>negative 1/8: Blood culture x2>> no growth 1/8: Urine>> multiple species (suggest recollection) 1/9: RVP>> + RSV 1/9: Sputum>> normal respiratory flora 1/9: HIV Screen>> non reactive 1/11: MRSA PCR>> negative  Antimicrobials:   Anti-infectives (From admission, onward)    Start     Dose/Rate Route Frequency Ordered Stop   09/13/23 1445  piperacillin-tazobactam (ZOSYN) IVPB 3.375 g        3.375 g 12.5 mL/hr over 240 Minutes Intravenous Every 8 hours 09/13/23 1351 09/17/23 0157   09/10/23 2315   cefTRIAXone (ROCEPHIN) 1 g in sodium chloride 0.9 % 100 mL IVPB  Status:  Discontinued        1 g 200 mL/hr over 30 Minutes Intravenous Every 24 hours 09/10/23 2305 09/13/23 1336       Significant Hospital Events: Including procedures, antibiotic start and stop dates in addition to other pertinent events   1/08: admit to [facility] 1/11: rapid response, transfer to ICU, intubated 1/12: remains vented, sedated, does not follow commands.  Failed SBT  1/13: Overnight due to concerns of aspiration TF's held.  Will repeat SBT today 1/14: Overnight pt had possible seizure activity with rhythmic tremors noted in the mouth/lower face/neck during episode pt unresponsive to pain.  Received 2 mg of versed and 2g keppra bolus with resolution of symptoms.  CT Head negative.  Standing dose of keppra 1g bid.  Neurology consulted.    1/15: On minimal vent settings, unable to tolerate WUA due to increased WOB. Shifting measures given for Hyperkalemia of 5.5 with peaked T waves.  Abdomen distended, KUB without evidence of obstruction, give Lactulose.  Diurese with 40 mg IV Lasix x1. 1/16: Failed WUA, with increased WOB and hypoxic with O2 sats dropping to mid 70's.  Add Seroquel.  Palliative Care consulted to assist with GOC.  Interim History / Subjective:  -No significant events noted overnight -Afebrile, on 2 mcg Levophed -On minimal vent support, plan for WUA/SBT as tolerated ~ with WUA, pt with increased WOB and tachycardia, along with hypoxia with O2 sats dropping to 70's -Will add Seroquel to assist with WUA in future -Diuresed 1.9  L yesterday (net +4.6 L ) with Lasix yesterday, Creatinine stable ~ will hold on diuresis today due to vasopressors  -Palliative Care consulted for GOC discussions -Abdomen is less distended and taut today ~ will give tap water enema   Objective   Blood pressure (!) 84/60, pulse 85, temperature 98.4 F (36.9 C), temperature source Axillary, resp. rate 12, height 6' (1.829 m),  weight 58 kg, SpO2 100%.    Vent Mode: PCV FiO2 (%):  [30 %] 30 % Set Rate:  [12 bmp] 12 bmp PEEP:  [5 cmH20] 5 cmH20 Plateau Pressure:  [20 cmH20-28 cmH20] 21 cmH20   Intake/Output Summary (Last 24 hours) at 09/18/2023 0815 Last data filed at 09/18/2023 0400 Gross per 24 hour  Intake 1605.64 ml  Output 1950 ml  Net -344.36 ml   Filed Weights   09/16/23 0230 09/17/23 0330 09/18/23 0345  Weight: 57.5 kg 59.2 kg 58 kg    Examination: General: Acutely-ill appearing male, NAD, mechanically intubated HENT: Supple, no JVD  Lungs: Coarse breath sounds with expiratory wheezes throughout, even, occasionally overbreathing the vent Cardiovascular: Sinus tachycardia, no m/r/g, 2+ radial/1+ distal, no edema  Abdomen: +BS x4,  less distended and taut, BS hypoactive  Extremities: Normal bulk and tone Neuro: Sedated, following commands, rhythmic tremors in the mouth/lower face/neck, PERRL GU: Indwelling foley catheter draining yellow urine with sediment   Resolved Hospital Problem list     Assessment & Plan:   #Acute toxic metabolic encephalopathy  #Mechanically intubation pain/discomfort  #Possible seizure activity  - Correct metabolic derangements  - Avoid sedating medications as able  - Maintain RASS goal 0 to -1 - PAD protocol to maintain RASS goal: Prn fentanyl and propofol gtts; will start precedex gtt and attempt to wean propofol gtt for SBT  - Scheduled valium for anxiety/delirium  - WUA daily - Continue thiamine and mvi  - EEG pending   - Keppra was discontinued - Neurology consulted appreciate input  - Seizure precautions  - Start Seroquel 25 mg BID to assist with WUA  #Mildly elevated troponin secondary to demand ischemia  #Hypotension suspect secondary to sedating medication Hx: HLD and DVT on Eliquis Echo 09/14/23: 50 to 55%, indeterminate diastolic parameters - Continuous telemetry monitoring  - Troponin peaked at 115 - Prn levophed gtt to maintain map 65 or  higher  - Continue heparin gtt: dosing per pharmacy  - Cardiology signed off no further cardiac evaluations indicated at this time   #Acute respiratory failure #AECOPD   #RSV Pneumonia  #Mechanical intubation  -Full vent support, implement lung protective strategies -Plateau pressures less than 30 cm H20 -Wean FiO2 & PEEP as tolerated to maintain O2 sats 88 to 92% -Follow intermittent Chest X-ray & ABG as needed -Spontaneous Breathing Trials when respiratory parameters met and mental status permits -Implement VAP Bundle -Bronchodilators and Brovana -Continue Solumedrol (weaned to 30 mg daily on 1/15) -Completed course of ABX as above  #Mild Hyperkalemia ~ RESOLVED #Hyponatremia ~ IMPROVED #Hypophosphatemia ~ RESOLVED -Monitor I&O's / urinary output -Follow BMP -Ensure adequate renal perfusion -Avoid nephrotoxic agents as able -Replace electrolytes as indicated ~ Pharmacy following for assistance with electrolyte replacement  #Anemia without obvious signs of acute blood loss  -Monitor for S/Sx of bleeding -Trend CBC -Heparin gtt for AC/VTE Prophylaxis  -Transfuse for Hgb <7  #Type II diabetes mellitus  #Hypoglycemia~resolved - CBG's q4hrs  - Continue resistance SSI and will start scheduled novolog 4 units q4hrs if TF's infusing  - Diabetes coordinator consulted appreciate  input  - Follow hypo/hyperglycemic protocol  - Target CBG's 140 to 180     Pt is critically ill with AECOPD in the setting of RSV and pneumonia.  Anticipate difficulty in liberating from mechanical ventilation.  May end up needing Tracheostomy.  Will consult Palliative Care to assist with goals of care conversations.   Best Practice (right click and "Reselect all SmartList Selections" daily)   Diet/type: TF's DVT prophylaxis systemic heparin Pressure ulcer(s): N/A GI prophylaxis: PPI Lines: RUE PICC Line and still needed  Foley:  Yes, and it is still needed Code Status:  full code Last date of  multidisciplinary goals of care discussion [09/18/2023]  01/16: Will update pts family today when they arrive at bedside.  Labs   CBC: Recent Labs  Lab 09/15/23 0439 09/15/23 0833 09/16/23 0225 09/17/23 0412 09/18/23 0155  WBC 12.0* 13.3* 12.6* 11.6* 10.3  NEUTROABS  --  11.0* 11.2* 9.7*  --   HGB 10.8* 10.8* 11.3* 10.5* 9.5*  HCT 33.4* 31.8* 34.2* 32.2* 29.7*  MCV 94.6 93.5 92.9 95.3 95.5  PLT 165 161 170 174 194    Basic Metabolic Panel: Recent Labs  Lab 09/14/23 1552 09/15/23 0439 09/15/23 0833 09/16/23 0225 09/17/23 0412 09/17/23 1529 09/18/23 0155 09/18/23 0446  NA  --   --  130* 130* 133* 129*  --  130*  K  --   --  4.2 4.9 5.5* 5.1  --  4.8  CL  --   --  92* 92* 90* 87*  --  87*  CO2  --   --  24 27 31 30   --  35*  GLUCOSE  --   --  69* 267* 119* 253*  --  120*  BUN  --   --  18 21 25* 25*  --  25*  CREATININE  --   --  0.82 0.82 0.73 0.63  --  0.52*  CALCIUM  --   --  8.4* 8.7* 8.8* 8.6*  --  9.0  MG 2.2 2.2  --  2.5* 2.5*  --  2.4  --   PHOS 2.3* 2.2*  --  2.4* 2.8  --   --  2.9   GFR: Estimated Creatinine Clearance: 79.5 mL/min (A) (by C-G formula based on SCr of 0.52 mg/dL (L)). Recent Labs  Lab 09/15/23 0833 09/16/23 0225 09/17/23 0412 09/18/23 0155  WBC 13.3* 12.6* 11.6* 10.3    Liver Function Tests: Recent Labs  Lab 09/13/23 1350 09/18/23 0446  AST 31  --   ALT 24  --   ALKPHOS 76  --   BILITOT 0.6  --   PROT 6.2*  --   ALBUMIN 3.4* 2.6*   No results for input(s): "LIPASE", "AMYLASE" in the last 168 hours. No results for input(s): "AMMONIA" in the last 168 hours.  ABG    Component Value Date/Time   PHART 7.43 09/16/2023 0816   PCO2ART 47 09/16/2023 0816   PO2ART 106 09/16/2023 0816   HCO3 31.2 (H) 09/16/2023 0816   O2SAT 100 09/16/2023 0816     Coagulation Profile: Recent Labs  Lab 09/13/23 1148  INR 1.1    Cardiac Enzymes: No results for input(s): "CKTOTAL", "CKMB", "CKMBINDEX", "TROPONINI" in the last 168  hours.  HbA1C: Hgb A1c MFr Bld  Date/Time Value Ref Range Status  09/15/2023 09:24 PM 6.4 (H) 4.8 - 5.6 % Final    Comment:    (NOTE) Pre diabetes:          5.7%-6.4%  Diabetes:              >6.4%  Glycemic control for   <7.0% adults with diabetes     CBG: Recent Labs  Lab 09/17/23 1646 09/17/23 1928 09/17/23 2308 09/18/23 0318 09/18/23 0803  GLUCAP 246* 229* 171* 105* 91    Review of Systems:   Unable to assess pt mechanically intubated   Past Medical History:  He,  has a past medical history of COPD (chronic obstructive pulmonary disease) (HCC), DVT (deep venous thrombosis) (HCC), Dyspnea, GERD (gastroesophageal reflux disease), HLD (hyperlipidemia), migraines, Oxygen dependent, Pneumonia (2023), Pre-diabetes, Pulmonary nodule, Rotator cuff tear, right, Tobacco abuse, and Tobacco use.   Surgical History:   Past Surgical History:  Procedure Laterality Date   CHEST TUBE INSERTION     SHOULDER ARTHROSCOPY WITH SUBACROMIAL DECOMPRESSION, ROTATOR CUFF REPAIR AND BICEP TENDON REPAIR Right 02/18/2023   Procedure: RIGHT SHOULDER ARTHROSCOPY WITH DEBRIDEMENT, DECOMPRESSION, ROTATOR CUFF REPAIR, BICEPS TENODESIS.;  Surgeon: Christena Flake, MD;  Location: [facility] ORS;  Service: Orthopedics;  Laterality: Right;     Social History:   reports that he has been smoking cigarettes. He has a 12 pack-year smoking history. He has never used smokeless tobacco. He reports current alcohol use. He reports that he does not use drugs.   Family History:  His family history is not on file.   Allergies No Active Allergies   Home Medications  Prior to Admission medications   Medication Sig Start Date End Date Taking? Authorizing Provider  albuterol (PROVENTIL) (2.5 MG/3ML) 0.083% nebulizer solution USE 1 VIAL IN NEBULIZER EVERY 6 HOURS AS NEEDED FOR WHEEZING 06/28/21  Yes [provider]  albuterol (VENTOLIN HFA) 108 (90 Base) MCG/ACT inhaler Inhale 1 puff into the lungs every 4  (four) hours as needed for wheezing or shortness of breath.   Yes [provider]  APIXABAN Everlene Balls) VTE STARTER PACK (10MG  AND 5MG ) Take as directed on package: start with two-5mg  tablets twice daily for 7 days. On day 8, switch to one-5mg  tablet twice daily. 02/26/23  Yes Merwyn Katos, MD  ondansetron (ZOFRAN) 4 MG tablet Take 1 tablet (4 mg total) by mouth every 6 (six) hours as needed for nausea. 02/19/23  Yes Anson Oregon, PA-C  oxyCODONE (OXY IR/ROXICODONE) 5 MG immediate release tablet Take 1 tablet by mouth 2 (two) times daily as needed. 08/29/23  Yes [provider]  OXYGEN Inhale 3 L into the lungs continuous.   Yes [provider]  predniSONE (DELTASONE) 10 MG tablet Take 2-6 tablets by mouth daily with breakfast.  ORIGINAL ZHY:QMVH 6 tabs daily x4 days; then 4 tabs daily x4 days; then 2 tabs daily x4 days.   Yes [provider]  predniSONE (DELTASONE) 20 MG tablet Take 20 mg by mouth daily with breakfast.   Yes [provider]  TRELEGY ELLIPTA 100-62.5-25 MCG/ACT AEPB Inhale 1 puff into the lungs every morning. 07/04/21  Yes [provider]  amoxicillin-clavulanate (AUGMENTIN) 875-125 MG tablet Take 1 tablet by mouth every 12 (twelve) hours. Patient not taking: Reported on 09/10/2023    [provider]  benralizumab (FASENRA PEN) 30 MG/ML prefilled autoinjector Inject 30 mg into the skin as directed. Patient not taking: Reported on 09/10/2023 07/23/23   [provider]  nystatin (MYCOSTATIN) 100000 UNIT/ML suspension Take 5 mLs by mouth 4 (four) times daily. Patient not taking: Reported on 09/10/2023 09/04/23   [provider]  omeprazole (PRILOSEC) 20 MG capsule Take 20 mg by  mouth daily. Patient not taking: Reported on 09/10/2023    [provider]  oxyCODONE (OXY IR/ROXICODONE) 5 MG immediate release tablet Take 1-2 tablets (5-10 mg total) by mouth every 4 (four) hours as needed for severe pain.  02/19/23   Anson Oregon, PA-C     Critical care time: 40 minutes      Harlon Ditty, AGACNP-BC [facility] Prefer epic messenger for cross cover needs If after hours, please call E-link

## 2023-09-19 ENCOUNTER — Inpatient Hospital Stay: Payer: 59

## 2023-09-19 DIAGNOSIS — G9341 Metabolic encephalopathy: Secondary | ICD-10-CM | POA: Diagnosis not present

## 2023-09-19 DIAGNOSIS — J441 Chronic obstructive pulmonary disease with (acute) exacerbation: Secondary | ICD-10-CM | POA: Diagnosis not present

## 2023-09-19 DIAGNOSIS — J9621 Acute and chronic respiratory failure with hypoxia: Secondary | ICD-10-CM | POA: Diagnosis not present

## 2023-09-19 DIAGNOSIS — J9622 Acute and chronic respiratory failure with hypercapnia: Secondary | ICD-10-CM | POA: Diagnosis not present

## 2023-09-19 DIAGNOSIS — Z515 Encounter for palliative care: Secondary | ICD-10-CM

## 2023-09-19 LAB — GLUCOSE, CAPILLARY
Glucose-Capillary: 100 mg/dL — ABNORMAL HIGH (ref 70–99)
Glucose-Capillary: 99 mg/dL (ref 70–99)
Glucose-Capillary: 169 mg/dL — ABNORMAL HIGH (ref 70–99)
Glucose-Capillary: 170 mg/dL — ABNORMAL HIGH (ref 70–99)
Glucose-Capillary: 164 mg/dL — ABNORMAL HIGH (ref 70–99)
Glucose-Capillary: 92 mg/dL (ref 70–99)

## 2023-09-19 LAB — CBC
HCT: 29.6 % — ABNORMAL LOW (ref 39.0–52.0)
Hemoglobin: 9.3 g/dL — ABNORMAL LOW (ref 13.0–17.0)
MCH: 30.4 pg (ref 26.0–34.0)
MCHC: 31.4 g/dL (ref 30.0–36.0)
MCV: 96.7 fL (ref 80.0–100.0)
Platelets: 208 10*3/uL (ref 150–400)
RBC: 3.06 MIL/uL — ABNORMAL LOW (ref 4.22–5.81)
RDW: 14.7 % (ref 11.5–15.5)
WBC: 7.5 10*3/uL (ref 4.0–10.5)
nRBC: 0.5 % — ABNORMAL HIGH (ref 0.0–0.2)

## 2023-09-19 LAB — RENAL FUNCTION PANEL
Albumin: 2.4 g/dL — ABNORMAL LOW (ref 3.5–5.0)
Anion gap: 10 (ref 5–15)
BUN: 23 mg/dL (ref 8–23)
CO2: 34 mmol/L — ABNORMAL HIGH (ref 22–32)
Calcium: 8.8 mg/dL — ABNORMAL LOW (ref 8.9–10.3)
Chloride: 89 mmol/L — ABNORMAL LOW (ref 98–111)
Creatinine, Ser: 0.62 mg/dL (ref 0.61–1.24)
GFR, Estimated: >60 mL/min (ref >60)
Glucose, Bld: 107 mg/dL — ABNORMAL HIGH (ref 70–99)
Phosphorus: 3 mg/dL (ref 2.5–4.6)
Potassium: 4.8 mmol/L (ref 3.5–5.1)
Sodium: 133 mmol/L — ABNORMAL LOW (ref 135–145)

## 2023-09-19 LAB — HEPARIN LEVEL (UNFRACTIONATED): Heparin Unfractionated: 1.04 [IU]/mL — ABNORMAL HIGH (ref 0.30–0.70)

## 2023-09-19 LAB — MAGNESIUM: Magnesium: 2.2 mg/dL (ref 1.7–2.4)

## 2023-09-19 MED ORDER — QUETIAPINE FUMARATE 25 MG PO TABS
25.0000 mg | ORAL_TABLET | Freq: Once | ORAL | Status: AC
  Administered 2023-09-19: 25 mg
  Filled 2023-09-19: qty 1

## 2023-09-19 MED ORDER — PREDNISONE 20 MG PO TABS
30.0000 mg | ORAL_TABLET | Freq: Every day | ORAL | Status: AC
  Administered 2023-09-20 – 2023-09-22 (×3): 30 mg
  Filled 2023-09-19 (×3): qty 1

## 2023-09-19 MED ORDER — VITAL 1.5 CAL PO LIQD
1000.0000 mL | ORAL | Status: DC
  Administered 2023-09-19 – 2023-09-28 (×7): 1000 mL

## 2023-09-19 MED ORDER — HEPARIN (PORCINE) 25000 UT/250ML-% IV SOLN
1000.0000 [IU]/h | INTRAVENOUS | Status: DC
  Administered 2023-09-19: 1000 [IU]/h via INTRAVENOUS

## 2023-09-19 MED ORDER — APIXABAN 5 MG PO TABS
5.0000 mg | ORAL_TABLET | Freq: Two times a day (BID) | ORAL | Status: DC
  Administered 2023-09-19 – 2023-09-22 (×7): 5 mg
  Filled 2023-09-19 (×7): qty 1

## 2023-09-19 MED ORDER — DIAZEPAM 5 MG PO TABS
10.0000 mg | ORAL_TABLET | Freq: Two times a day (BID) | ORAL | Status: DC
  Administered 2023-09-19 – 2023-09-25 (×12): 10 mg
  Filled 2023-09-19 (×12): qty 2

## 2023-09-19 MED ORDER — PREDNISONE 20 MG PO TABS
20.0000 mg | ORAL_TABLET | Freq: Every day | ORAL | Status: AC
  Administered 2023-09-23 – 2023-09-25 (×3): 20 mg
  Filled 2023-09-19 (×3): qty 1

## 2023-09-19 MED ORDER — PREDNISONE 10 MG PO TABS: 10.0000 mg | ORAL_TABLET | Freq: Every day | ORAL | Status: DC

## 2023-09-19 MED ORDER — QUETIAPINE FUMARATE 25 MG PO TABS
50.0000 mg | ORAL_TABLET | Freq: Two times a day (BID) | ORAL | Status: DC
  Administered 2023-09-19 – 2023-09-25 (×12): 50 mg
  Filled 2023-09-19 (×12): qty 2

## 2023-09-19 NOTE — Progress Notes (Signed)
Daily Progress Note   Patient Name: Benjamin Bolton       Date: 09/19/2023 DOB: September 23, 1960  Age: 63 y.o. MRN#: [id number] Attending Physician: Janann Colonel, MD Primary Care Physician: [facility] Admit Date: 09/10/2023  Reason for Consultation/Follow-up: Establishing goals of care  HPI/Brief Hospital Review: 63 y.o. male  with past medical history of 2 diabetes, GERD, HLD, tobacco use disorder, COPD stage IV (2-3L [place] at home), DVT (Eliquis) admitted on 09/10/2023 with acute on chronic hypoxic and hypercapnic respiratory failure in the setting of AECOPD with RSV pneumonia.   1/11, rapid response, transferred to ICU, patient was intubated and sedated..  1/12, remains vented, sedated, does not follow commands.  Failed SBT  1/13, overnight due to concerns of aspiration TF's held.  Will repeat SBT today 1/14, overnight pt had possible seizure activity with rhythmic tremors noted in the mouth/lower face/neck during episode pt unresponsive to pain.  Received 2 mg of versed and 2g keppra bolus with resolution of symptoms.  CT Head negative.  Standing dose of keppra 1g bid.  Neurology consulted.    1/15, on minimal vent settings, unable to tolerate WUA due to increased WOB. Shifting measures given for Hyperkalemia of 5.5 with peaked T waves.  Abdomen distended, KUB without evidence of obstruction, give Lactulose.  Diurese with 40 mg IV Lasix x1. 1/16, Failed WUA, with increased WOB and hypoxic with O2 sats dropping to mid 70's.  Add Seroquel.     Palliative Care consulted to assist with GOC.  Subjective: Extensive chart review has been completed prior to meeting patient including labs, vital signs, imaging, progress notes, orders, and available advanced directive documents from current and  previous encounters.    Visited with Benjamin Bolton at his bedside. He remains intubated and sedated. No family at bedside during time of visit.  Spoke with CCM team, unfortunately Benjamin Bolton failed WUA/SBT again today. Plan to continue trials through weekend with need to have goals of care conversation early next week.Marland Kitchen Supposedly, Benjamin Bolton is still currently legally married but has been separated from his wife for many years. CCM team has been in communication primarily with sisters who serves as caretakers.  Called and spoke with sister-Debra, provided medical updates from today. Stanton Kidney able to provide me with Mr. Tara wife contact information as number was not  listed in chart-has been added.  Called and spoke with wife-Benjamin Bolton, provided medial updates. Plan set to meet at bedside tomorrow morning with son and in collaboration with CCM team.  Care plan was discussed with CCM team and nursing staff.  Thank you for allowing the Palliative Medicine Team to assist in the care of this patient.  Total time:  35 minutes  Time spent includes: Detailed review of medical records (labs, imaging, vital signs), medically appropriate exam (mental status, respiratory, cardiac, skin), discussed with treatment team, counseling and educating patient, family and staff, documenting clinical information, medication management and coordination of care.  Leeanne Deed, DNP, AGNP-C Palliative Medicine   Please contact Palliative Medicine Team phone at [phone] for questions and concerns.

## 2023-09-19 NOTE — Progress Notes (Signed)
NAME:  Benjamin Bolton, MRN:  [id number], DOB:  1960-10-30, LOS: 9 ADMISSION DATE:  09/10/2023, CONSULTATION DATE: 09/13/2023  Brief Pt Description / Synopsis:  63 y.o. 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   History of Present Illness:  Benjamin Bolton is a 63 year old male with a history of severe COPD followed by [facility] who presents to the hospital from [facility] clinic on 1/8 with increased shortness of breath. He was admitted to the medicine service for management of COPD exacerbation and found to have an RSV infection.   Patient was admitted to [facility] and initiated on nebulizers, steroids, and antibiotics. He was managed medically for COPD exacerbation and UTI but developed worsening respiratory distress this AM requiring the initiation of BiPAP. Rapid response was called and he was transferred to the ICU. Initial blood gas showed pH of 7.2, with repeat VBG showing he remained acidemic at 7.18. Given altered mental status and respiratory failure, decision made at the bedside to proceed with emergent intubation.  Pertinent  Medical History  DVT on Eliquis COPD on 2L O2 Type II Diabetes Mellitus  GERD HLD Pulmonary Nodule Tobacco Abuse   Micro Data:  1/8: SARS-CoV-2 PCR>>negative 1/8: Blood culture x2>> no growth 1/8: Urine>> multiple species (suggest recollection) 1/9: RVP>> + RSV 1/9: Sputum>> normal respiratory flora 1/9: HIV Screen>> non reactive 1/11: MRSA PCR>> negative  Antimicrobials:   Anti-infectives (From admission, onward)    Start     Dose/Rate Route Frequency Ordered Stop   09/13/23 1445  piperacillin-tazobactam (ZOSYN) IVPB 3.375 g        3.375 g 12.5 mL/hr over 240 Minutes Intravenous Every 8 hours 09/13/23 1351 09/17/23 0157   09/10/23 2315   cefTRIAXone (ROCEPHIN) 1 g in sodium chloride 0.9 % 100 mL IVPB  Status:  Discontinued        1 g 200 mL/hr over 30 Minutes Intravenous Every 24 hours 09/10/23 2305 09/13/23 1336       Significant Hospital Events: Including procedures, antibiotic start and stop dates in addition to other pertinent events   1/08: admit to [facility] 1/11: rapid response, transfer to ICU, intubated 1/12: remains vented, sedated, does not follow commands.  Failed SBT  1/13: Overnight due to concerns of aspiration TF's held.  Will repeat SBT today 1/14: Overnight pt had possible seizure activity with rhythmic tremors noted in the mouth/lower face/neck during episode pt unresponsive to pain.  Received 2 mg of versed and 2g keppra bolus with resolution of symptoms.  CT Head negative.  Standing dose of keppra 1g bid.  Neurology consulted.    1/15: On minimal vent settings, unable to tolerate WUA due to increased WOB. Shifting measures given for Hyperkalemia of 5.5 with peaked T waves.  Abdomen distended, KUB without evidence of obstruction, give Lactulose.  Diurese with 40 mg IV Lasix x1. 1/16: Failed WUA, with increased WOB and hypoxic with O2 sats dropping to mid 70's.  Add Seroquel.  Palliative Care consulted to assist with GOC. 1/17: Precedex started for WUA.  Failed SBT due to increased WOB, tachypnea, and hypoxia.  Increase Seroquel to 50 mg BID. Transition Heparin to Elqiuis  Interim History / Subjective:  -No significant events noted overnight -Afebrile, no requiring vasopressors -On minimal vent support, plan for WUA/SBT as tolerated ~ Precedex utilized for  WUA,  -SBT performed ~ pt with increased WOB, tachypnea, tachycardia, along with hypoxia with O2 sats dropping to low 80's -Will increase Seroquel to 50 mg BID -Palliative Care following for GOC discussions    Objective   Blood pressure (!) 89/66, pulse 86, temperature 99.2 F (37.3 C), temperature source Oral, resp. rate 12, height 6' (1.829 m), weight  60.6 kg, SpO2 99%.    Vent Mode: PCV FiO2 (%):  [30 %] 30 % Set Rate:  [12 bmp] 12 bmp PEEP:  [5 cmH20] 5 cmH20 Plateau Pressure:  [16 cmH20-22 cmH20] 22 cmH20   Intake/Output Summary (Last 24 hours) at 09/19/2023 0749 Last data filed at 09/19/2023 0700 Gross per 24 hour  Intake 2163.06 ml  Output 2050 ml  Net 113.06 ml   Filed Weights   09/17/23 0330 09/18/23 0345 09/19/23 0500  Weight: 59.2 kg 58 kg 60.6 kg    Examination: General: Acutely-ill appearing male, NAD, mechanically intubated HENT: Supple, no JVD, orally intubated Lungs: Coarse breath sounds with expiratory wheezes throughout, even, occasionally overbreathing the vent Cardiovascular: Sinus tachycardia, no m/r/g, 2+ radial/1+ distal, no edema  Abdomen: +BS x4,  less distended and taut, BS hypoactive  Extremities: Normal bulk and tone Neuro: Sedated, following commands, no focal deficits noted, PERRL GU: Indwelling foley catheter draining yellow urine with sediment   Resolved Hospital Problem list     Assessment & Plan:   #Acute toxic metabolic encephalopathy  #Mechanically intubation pain/discomfort  #Possible seizure activity  - Correct metabolic derangements  - Avoid sedating medications as able  - Maintain RASS goal 0 to -1 - PAD protocol to maintain RASS goal: Prn fentanyl and propofol gtts; will start precedex gtt and attempt to wean propofol gtt for SBT  - Scheduled valium for anxiety/delirium  - WUA daily - Continue thiamine and mvi  - Keppra was discontinued - Seizure precautions  - Increase Seroquel to 50 mg BID to assist with WUA  #Mildly elevated troponin secondary to demand ischemia  #Hypotension suspect secondary to sedating medication Hx: HLD and DVT on Eliquis Echo 09/14/23: 50 to 55%, indeterminate diastolic parameters - Continuous telemetry monitoring  - Troponin peaked at 115 - Prn levophed gtt to maintain map 65 or higher  - Continue heparin gtt: dosing per pharmacy ~ discussed with  Dr. Larinda Buttery, will transition to [facility] - Cardiology signed off no further cardiac evaluations indicated at this time   #Acute respiratory failure #AECOPD   #RSV Pneumonia  #Mechanical intubation  -Full vent support, implement lung protective strategies -Plateau pressures less than 30 cm H20 -Wean FiO2 & PEEP as tolerated to maintain O2 sats 88 to 92% -Follow intermittent Chest X-ray & ABG as needed -Spontaneous Breathing Trials when respiratory parameters met and mental status permits -Implement VAP Bundle -Bronchodilators and Brovana -Continue Solumedrol (weaned to 30 mg daily on 1/15) -Completed course of ABX as above  #Mild Hyperkalemia ~ RESOLVED #Hyponatremia ~ IMPROVED #Hypophosphatemia ~ RESOLVED -Monitor I&O's / urinary output -Follow BMP -Ensure adequate renal perfusion -Avoid nephrotoxic agents as able -Replace electrolytes as indicated ~ Pharmacy following for assistance with electrolyte replacement  #Anemia without obvious signs of acute blood loss  -Monitor for S/Sx of bleeding -Trend CBC -Eliquis for AC/VTE Prophylaxis  -Transfuse for Hgb <7  #Type II diabetes mellitus  #Hypoglycemia~resolved - CBG's q4hrs  - Continue resistance SSI and will start scheduled novolog 4 units q4hrs if TF's infusing  - Diabetes coordinator consulted appreciate input  - Follow hypo/hyperglycemic protocol  - Target  CBG's 140 to 180     Pt is critically ill with AECOPD in the setting of RSV and pneumonia.  Anticipate difficulty in liberating from mechanical ventilation.  May end up needing Tracheostomy.  Will consult Palliative Care to assist with goals of care conversations.   Best Practice (right click and "Reselect all SmartList Selections" daily)   Diet/type: TF's DVT prophylaxis systemic heparin Pressure ulcer(s): N/A GI prophylaxis: PPI Lines: RUE PICC Line and still needed  Foley:  Yes, and it is still needed Code Status:  full code Last date of multidisciplinary  goals of care discussion [09/19/2023]  01/17: Will update pts family today when they arrive at bedside.  Labs   CBC: Recent Labs  Lab 09/15/23 0833 09/16/23 0225 09/17/23 0412 09/18/23 0155 09/19/23 0556  WBC 13.3* 12.6* 11.6* 10.3 7.5  NEUTROABS 11.0* 11.2* 9.7*  --   --   HGB 10.8* 11.3* 10.5* 9.5* 9.3*  HCT 31.8* 34.2* 32.2* 29.7* 29.6*  MCV 93.5 92.9 95.3 95.5 96.7  PLT 161 170 174 194 208    Basic Metabolic Panel: Recent Labs  Lab 09/15/23 0439 09/15/23 0833 09/16/23 0225 09/17/23 0412 09/17/23 1529 09/18/23 0155 09/18/23 0446 09/19/23 0556  NA  --    < > 130* 133* 129*  --  130* 133*  K  --    < > 4.9 5.5* 5.1  --  4.8 4.8  CL  --    < > 92* 90* 87*  --  87* 89*  CO2  --    < > 27 31 30   --  35* 34*  GLUCOSE  --    < > 267* 119* 253*  --  120* 107*  BUN  --    < > 21 25* 25*  --  25* 23  CREATININE  --    < > 0.82 0.73 0.63  --  0.52* 0.62  CALCIUM  --    < > 8.7* 8.8* 8.6*  --  9.0 8.8*  MG 2.2  --  2.5* 2.5*  --  2.4  --  2.2  PHOS 2.2*  --  2.4* 2.8  --   --  2.9 3.0   < > = values in this interval not displayed.   GFR: Estimated Creatinine Clearance: 83.1 mL/min (by C-G formula based on SCr of 0.62 mg/dL). Recent Labs  Lab 09/16/23 0225 09/17/23 0412 09/18/23 0155 09/19/23 0556  WBC 12.6* 11.6* 10.3 7.5    Liver Function Tests: Recent Labs  Lab 09/13/23 1350 09/18/23 0446 09/19/23 0556  AST 31  --   --   ALT 24  --   --   ALKPHOS 76  --   --   BILITOT 0.6  --   --   PROT 6.2*  --   --   ALBUMIN 3.4* 2.6* 2.4*   No results for input(s): "LIPASE", "AMYLASE" in the last 168 hours. No results for input(s): "AMMONIA" in the last 168 hours.  ABG    Component Value Date/Time   PHART 7.43 09/16/2023 0816   PCO2ART 47 09/16/2023 0816   PO2ART 106 09/16/2023 0816   HCO3 31.2 (H) 09/16/2023 0816   O2SAT 100 09/16/2023 0816     Coagulation Profile: Recent Labs  Lab 09/13/23 1148  INR 1.1    Cardiac Enzymes: No results for  input(s): "CKTOTAL", "CKMB", "CKMBINDEX", "TROPONINI" in the last 168 hours.  HbA1C: Hgb A1c MFr Bld  Date/Time Value Ref Range Status  09/15/2023 09:24 PM 6.4 (  H) 4.8 - 5.6 % Final    Comment:    (NOTE) Pre diabetes:          5.7%-6.4%  Diabetes:              >6.4%  Glycemic control for   <7.0% adults with diabetes     CBG: Recent Labs  Lab 09/18/23 1612 09/18/23 1934 09/18/23 2319 09/18/23 2321 09/19/23 0353  GLUCAP 185* 124* 92 96 100*    Review of Systems:   Unable to assess pt mechanically intubated   Past Medical History:  He,  has a past medical history of COPD (chronic obstructive pulmonary disease) (HCC), DVT (deep venous thrombosis) (HCC), Dyspnea, GERD (gastroesophageal reflux disease), HLD (hyperlipidemia), migraines, Oxygen dependent, Pneumonia (2023), Pre-diabetes, Pulmonary nodule, Rotator cuff tear, right, Tobacco abuse, and Tobacco use.   Surgical History:   Past Surgical History:  Procedure Laterality Date   CHEST TUBE INSERTION     SHOULDER ARTHROSCOPY WITH SUBACROMIAL DECOMPRESSION, ROTATOR CUFF REPAIR AND BICEP TENDON REPAIR Right 02/18/2023   Procedure: RIGHT SHOULDER ARTHROSCOPY WITH DEBRIDEMENT, DECOMPRESSION, ROTATOR CUFF REPAIR, BICEPS TENODESIS.;  Surgeon: Christena Flake, MD;  Location: [facility] ORS;  Service: Orthopedics;  Laterality: Right;     Social History:   reports that he has been smoking cigarettes. He has a 12 pack-year smoking history. He has never used smokeless tobacco. He reports current alcohol use. He reports that he does not use drugs.   Family History:  His family history is not on file.   Allergies No Active Allergies   Home Medications  Prior to Admission medications   Medication Sig Start Date End Date Taking? Authorizing Provider  albuterol (PROVENTIL) (2.5 MG/3ML) 0.083% nebulizer solution USE 1 VIAL IN NEBULIZER EVERY 6 HOURS AS NEEDED FOR WHEEZING 06/28/21  Yes [provider]  albuterol (VENTOLIN HFA) 108  (90 Base) MCG/ACT inhaler Inhale 1 puff into the lungs every 4 (four) hours as needed for wheezing or shortness of breath.   Yes [provider]  APIXABAN Everlene Balls) VTE STARTER PACK (10MG  AND 5MG ) Take as directed on package: start with two-5mg  tablets twice daily for 7 days. On day 8, switch to one-5mg  tablet twice daily. 02/26/23  Yes Merwyn Katos, MD  ondansetron (ZOFRAN) 4 MG tablet Take 1 tablet (4 mg total) by mouth every 6 (six) hours as needed for nausea. 02/19/23  Yes Anson Oregon, PA-C  oxyCODONE (OXY IR/ROXICODONE) 5 MG immediate release tablet Take 1 tablet by mouth 2 (two) times daily as needed. 08/29/23  Yes [provider]  OXYGEN Inhale 3 L into the lungs continuous.   Yes [provider]  predniSONE (DELTASONE) 10 MG tablet Take 2-6 tablets by mouth daily with breakfast.  ORIGINAL GLO:VFIE 6 tabs daily x4 days; then 4 tabs daily x4 days; then 2 tabs daily x4 days.   Yes [provider]  predniSONE (DELTASONE) 20 MG tablet Take 20 mg by mouth daily with breakfast.   Yes [provider]  TRELEGY ELLIPTA 100-62.5-25 MCG/ACT AEPB Inhale 1 puff into the lungs every morning. 07/04/21  Yes [provider]  amoxicillin-clavulanate (AUGMENTIN) 875-125 MG tablet Take 1 tablet by mouth every 12 (twelve) hours. Patient not taking: Reported on 09/10/2023    [provider]  benralizumab (FASENRA PEN) 30 MG/ML prefilled autoinjector Inject 30 mg into the skin as directed. Patient not taking: Reported on 09/10/2023 07/23/23   [provider]  nystatin (MYCOSTATIN) 100000 UNIT/ML suspension Take 5 mLs by  mouth 4 (four) times daily. Patient not taking: Reported on 09/10/2023 09/04/23   [provider]  omeprazole (PRILOSEC) 20 MG capsule Take 20 mg by mouth daily. Patient not taking: Reported on 09/10/2023    [provider]  oxyCODONE (OXY IR/ROXICODONE) 5 MG immediate release tablet Take 1-2 tablets (5-10 mg  total) by mouth every 4 (four) hours as needed for severe pain. 02/19/23   Anson Oregon, PA-C     Critical care time: 40 minutes      Harlon Ditty, AGACNP-BC [facility] Prefer epic messenger for cross cover needs If after hours, please call E-link

## 2023-09-19 NOTE — Progress Notes (Addendum)
Nutrition Follow-up  DOCUMENTATION CODES:   Non-severe (moderate) malnutrition in context of chronic illness  INTERVENTION:   -TF via OGT:   Vital 1.5 @ 20 ml/hr and increase by 10 ml every 8 hours to goal rate of 55 ml/hr.   30 ml free water flush every 4 hours  Tube feeding regimen provides 1931 kcals, 99 grams of protein, and 1250 ml of H2O.    -Monitor Mg, K, and Phos and replete as needed secondary to high refeeding risk -Continue MVI with minerals daily per tube  NUTRITION DIAGNOSIS:   Moderate Malnutrition related to chronic illness (pt sedated and ventilated) as evidenced by moderate fat depletion, moderate muscle depletion.  Ongoing  GOAL:   Provide needs based on ASPEN/SCCM guidelines  Progressing  MONITOR:   Vent status, Labs, Weight trends, TF tolerance, Skin, I & O's  REASON FOR ASSESSMENT:   Consult Enteral/tube feeding initiation and management  ASSESSMENT:   63 y/o male with h/o COPD, DVT, HLD, GERD, pulmonary nodule and DM who is admitted with RSV, pneumonia and COPD exacerbation.  Patient is currently intubated on ventilator support. OGT in place; per KUB today, tip of tube in stomach.  MV: 7.7 L/min Temp (24hrs), Avg:99 F (37.2 C), Min:98.3 F (36.8 C), Max:99.2 F (37.3 C)  Propofol: 11.5 ml/hr (provides 316 kcals daily)  Reviewed I/O's: +113 ml x 24 hours and +5.1 L since admission  UOP: 1.9 L x 24 hours  OGT output: 150 ml x 24 hours  Per RN, pt able to open eyes but does not follow commands.   Per KUB today, gas pattern is normal. No distention noted per RD. TF held yesterday secondary to abdominal distention.   Noted TF resumed at trickle rate of 20 ml/her. Case discussed with PCCM; received permission to advance TF.   Palliative care following for goals of care discussions; attempting to contact family for further discussions. Per PCCM, pt with AECOPD in the setting of RSV and pneumonia. Anticipate difficulty in liberating  from mechanical ventilation and may end up requiring trach.   Wt has been stable since admission.  Medications reviewed and include calcium, colace, miralax, prednisone, and fentanyl.   Labs reviewed: Na: 133, CBGS: 92-169 (inpatient orders for glycemic control are 0-20 units insulin aspart every 4 hours and 5 units insulin aspart every 4 hours).    Diet Order:   Diet Order             Diet NPO time specified  Diet effective now                   EDUCATION NEEDS:   No education needs have been identified at this time  Skin:  Skin Assessment: Reviewed RN Assessment  Last BM:  09/18/23 (type 6)  Height:   Ht Readings from Last 1 Encounters:  09/10/23 6' (1.829 m)    Weight:   Wt Readings from Last 1 Encounters:  09/19/23 60.6 kg    Ideal Body Weight:  80.9 kg  BMI:  Body mass index is 18.12 kg/m.  Estimated Nutritional Needs:   Kcal:  1651  Protein:  90-100g/day  Fluid:  1.7-1.9L/day    Levada Schilling, RD, LDN, CDCES Registered Dietitian III Certified Diabetes Care and Education Specialist If unable to reach this RD, please use "RD Inpatient" group chat on secure chat between hours of 8am-4 pm daily

## 2023-09-19 NOTE — Consult Note (Signed)
PHARMACY - ANTICOAGULATION CONSULT NOTE  Pharmacy Consult for IV Heparin Indication:  Hx DVT (apixaban on hold)  Patient Measurements: Height: 6' (182.9 cm) Weight: 60.6 kg (133 lb 9.6 oz) IBW/kg (Calculated) : 77.6 Heparin Dosing Weight: 52.2 kg  Labs: Recent Labs    09/17/23 0412 09/17/23 1253 09/17/23 1529 09/17/23 1951 09/18/23 0155 09/18/23 0446 09/19/23 0556  HGB 10.5*  --   --   --  9.5*  --  9.3*  HCT 32.2*  --   --   --  29.7*  --  29.6*  PLT 174  --   --   --  194  --  208  APTT 46* 54*  --  83* 82*  --   --   HEPARINUNFRC 0.61  --   --   --  0.84*  --  1.04*  CREATININE 0.73  --  0.63  --   --  0.52* 0.62    Estimated Creatinine Clearance: 83.1 mL/min (by C-G formula based on SCr of 0.62 mg/dL).  Medical History: Past Medical History:  Diagnosis Date   COPD (chronic obstructive pulmonary disease) (HCC)    DVT (deep venous thrombosis) (HCC)    Dyspnea    GERD (gastroesophageal reflux disease)    no meds   HLD (hyperlipidemia)    Hx of migraines    Oxygen dependent    3 L [place]   Pneumonia 2023   Pre-diabetes    Pulmonary nodule    Rotator cuff tear, right    Tobacco abuse    Tobacco use     Medications:  Apixaban 5 mg BID, last dose 09/12/23 at 2206  Assessment: 63 y/o M with medical history as above and including hx DVT on apixaban who is admitted with acute on chronic respiratory failure and now intubated. Pharmacy consulted for IV heparin.  Hgb trending down (13.3 > 11.6), unsure what may have caused this. Continue to monitor trend.    Date/Time aPTT (HL) Rate  Comment 1/11 1725 48  800 units/hr Subtherapeutic 1/12 0131 106  950 units/hr Supratherapeutic 1/12 0924 89 (HL > 1.1)  900 units/hr Therapeutic x 1 1/12 1546 100  900 units/hr Therapeutic x 2 1/12 2159 125  900 units/hr Supratherapeutic 1/13 0439 115 / 1.01 800 units/hr Supratherapeutic 1/13 1242 79 / --  700 units/hr Therapeutic x 1 1/13 2025 108 / --  700  units/hr Supratherapeutic 1/14 0225      73 / 0.71         650 units/hr     Therapeutic x 1 1/14 0805 51 / --  650 units/hr Subtherapeutic 1/14 1459 52 / --  750 units/hr Subtherapeutic 1/14 2223       66                    850 units/hr     Therapeutic x 1 1/15 0412       46 / 0.61          850 units/hr     SUBtherapeutic 1/15 1253 54 / --  1050 units/hr SUBtherapeutic 1/15 1951 83 / --  1200 units/hr Therapeutic x1 1/16 0155       82 / 0.84          1200 units/hr   Therapeutic X 2 1/17 0556         1.04               1200 units/hr    Elevated  No issues with infusion, no signs/symptoms of bleeding.  Goal of Therapy:  HL :  0.3 - 0.7  aPTT 66 - 102 seconds Monitor platelets by anticoagulation protocol: Yes   Plan:  1/17:  HL @ 0556 = 1.04, elevated  - will hold heparin infusion for 1 hr and restart around 0800 at 1000 units/hr - will recheck HL 6 hrs after restart  - HL and CBC with AM labs.   Thank you for involving pharmacy in this patient's care.   Shaleah Nissley D Clinical Pharmacist 09/19/2023 6:54 AM

## 2023-09-19 NOTE — Progress Notes (Addendum)
0740 patient open eyes to name doesn't follow commands on vent with sedation 0800 Heparin restarted  1130 patient not tolerated vent changed 02 keeps dropping to 80s increased dex and fentanyl also giving fentanyl bolus resp in 30s patient gaging some follows. Patient placed back of full support dex also maxed prop restarted  1200 BP dropping prop off paused dex paused fentalyl 1215 fentanyl and dex restarted 1235 bp dropping stopped dex 1250 prop restarted BP improved 1300 prop increased due to agitation  1600 prop increased due to agitation

## 2023-09-19 NOTE — Plan of Care (Signed)
  Problem: Education: Goal: Knowledge of General Education information will improve Description: Including pain rating scale, medication(s)/side effects and non-pharmacologic comfort measures Outcome: Not Progressing   Problem: Health Behavior/Discharge Planning: Goal: Ability to manage health-related needs will improve Outcome: Not Progressing   Problem: Clinical Measurements: Goal: Ability to maintain clinical measurements within normal limits will improve Outcome: Not Progressing Goal: Will remain free from infection Outcome: Not Progressing Goal: Diagnostic test results will improve Outcome: Not Progressing Goal: Respiratory complications will improve Outcome: Not Progressing Goal: Cardiovascular complication will be avoided Outcome: Not Progressing   Problem: Activity: Goal: Risk for activity intolerance will decrease Outcome: Not Progressing   Problem: Nutrition: Goal: Adequate nutrition will be maintained Outcome: Not Progressing   Problem: Coping: Goal: Level of anxiety will decrease Outcome: Not Progressing   Problem: Elimination: Goal: Will not experience complications related to bowel motility Outcome: Not Progressing Goal: Will not experience complications related to urinary retention Outcome: Not Progressing   Problem: Pain Management: Goal: General experience of comfort will improve Outcome: Not Progressing   Problem: Safety: Goal: Ability to remain free from injury will improve Outcome: Not Progressing   Problem: Skin Integrity: Goal: Risk for impaired skin integrity will decrease Outcome: Not Progressing   Problem: Education: Goal: Knowledge of disease or condition will improve Outcome: Not Progressing   Problem: Activity: Goal: Ability to tolerate increased activity will improve Outcome: Not Progressing Goal: Will verbalize the importance of balancing activity with adequate rest periods Outcome: Not Progressing   Problem: Education: Goal:  Knowledge of the prescribed therapeutic regimen will improve Outcome: Not Progressing   Problem: Respiratory: Goal: Ability to maintain a clear airway will improve Outcome: Not Progressing Goal: Levels of oxygenation will improve Outcome: Not Progressing Goal: Ability to maintain adequate ventilation will improve Outcome: Not Progressing   Problem: Education: Goal: Ability to describe self-care measures that may prevent or decrease complications (Diabetes Survival Skills Education) will improve Outcome: Not Progressing   Problem: Coping: Goal: Ability to adjust to condition or change in health will improve Outcome: Not Progressing   Problem: Fluid Volume: Goal: Ability to maintain a balanced intake and output will improve Outcome: Not Progressing   Problem: Health Behavior/Discharge Planning: Goal: Ability to identify and utilize available resources and services will improve Outcome: Not Progressing Goal: Ability to manage health-related needs will improve Outcome: Not Progressing   Problem: Metabolic: Goal: Ability to maintain appropriate glucose levels will improve Outcome: Not Progressing   Problem: Nutritional: Goal: Maintenance of adequate nutrition will improve Outcome: Not Progressing Goal: Progress toward achieving an optimal weight will improve Outcome: Not Progressing   Problem: Skin Integrity: Goal: Risk for impaired skin integrity will decrease Outcome: Not Progressing   Problem: Tissue Perfusion: Goal: Adequacy of tissue perfusion will improve Outcome: Not Progressing   Problem: Activity: Goal: Ability to tolerate increased activity will improve Outcome: Not Progressing   Problem: Respiratory: Goal: Ability to maintain a clear airway and adequate ventilation will improve Outcome: Not Progressing   Problem: Role Relationship: Goal: Method of communication will improve Outcome: Not Progressing

## 2023-09-20 ENCOUNTER — Inpatient Hospital Stay: Payer: 59

## 2023-09-20 DIAGNOSIS — G9341 Metabolic encephalopathy: Secondary | ICD-10-CM | POA: Diagnosis not present

## 2023-09-20 DIAGNOSIS — J9622 Acute and chronic respiratory failure with hypercapnia: Secondary | ICD-10-CM | POA: Diagnosis not present

## 2023-09-20 DIAGNOSIS — J441 Chronic obstructive pulmonary disease with (acute) exacerbation: Secondary | ICD-10-CM | POA: Diagnosis not present

## 2023-09-20 DIAGNOSIS — Z515 Encounter for palliative care: Secondary | ICD-10-CM | POA: Diagnosis not present

## 2023-09-20 DIAGNOSIS — J9621 Acute and chronic respiratory failure with hypoxia: Secondary | ICD-10-CM | POA: Diagnosis not present

## 2023-09-20 LAB — BLOOD GAS, ARTERIAL
Acid-Base Excess: 13.9 mmol/L — ABNORMAL HIGH (ref 0.0–2.0)
Bicarbonate: 41.2 mmol/L — ABNORMAL HIGH (ref 20.0–28.0)
O2 Saturation: 86.6 %
Patient temperature: 37
pCO2 arterial: 62 mm[Hg] — ABNORMAL HIGH (ref 32–48)
pH, Arterial: 7.43 (ref 7.35–7.45)
pO2, Arterial: 53 mm[Hg] — ABNORMAL LOW (ref 83–108)

## 2023-09-20 LAB — RENAL FUNCTION PANEL
Albumin: 2.5 g/dL — ABNORMAL LOW (ref 3.5–5.0)
Anion gap: 8 (ref 5–15)
BUN: 26 mg/dL — ABNORMAL HIGH (ref 8–23)
CO2: 33 mmol/L — ABNORMAL HIGH (ref 22–32)
Calcium: 8.8 mg/dL — ABNORMAL LOW (ref 8.9–10.3)
Chloride: 92 mmol/L — ABNORMAL LOW (ref 98–111)
Creatinine, Ser: 0.53 mg/dL — ABNORMAL LOW (ref 0.61–1.24)
GFR, Estimated: >60 mL/min
Glucose, Bld: 183 mg/dL — ABNORMAL HIGH (ref 70–99)
Phosphorus: 3 mg/dL (ref 2.5–4.6)
Potassium: 4.1 mmol/L (ref 3.5–5.1)
Sodium: 133 mmol/L — ABNORMAL LOW (ref 135–145)

## 2023-09-20 LAB — GLUCOSE, CAPILLARY
Glucose-Capillary: 118 mg/dL — ABNORMAL HIGH (ref 70–99)
Glucose-Capillary: 120 mg/dL — ABNORMAL HIGH (ref 70–99)
Glucose-Capillary: 137 mg/dL — ABNORMAL HIGH (ref 70–99)
Glucose-Capillary: 141 mg/dL — ABNORMAL HIGH (ref 70–99)
Glucose-Capillary: 141 mg/dL — ABNORMAL HIGH (ref 70–99)
Glucose-Capillary: 160 mg/dL — ABNORMAL HIGH (ref 70–99)

## 2023-09-20 LAB — CBC
HCT: 30.6 % — ABNORMAL LOW (ref 39.0–52.0)
Hemoglobin: 9.8 g/dL — ABNORMAL LOW (ref 13.0–17.0)
MCH: 30.1 pg (ref 26.0–34.0)
MCHC: 32 g/dL (ref 30.0–36.0)
MCV: 93.9 fL (ref 80.0–100.0)
Platelets: 241 10*3/uL (ref 150–400)
RBC: 3.26 MIL/uL — ABNORMAL LOW (ref 4.22–5.81)
RDW: 14.8 % (ref 11.5–15.5)
WBC: 9.4 10*3/uL (ref 4.0–10.5)
nRBC: 1 % — ABNORMAL HIGH (ref 0.0–0.2)

## 2023-09-20 MED ORDER — FUROSEMIDE 10 MG/ML IJ SOLN
20.0000 mg | Freq: Once | INTRAMUSCULAR | Status: AC
  Administered 2023-09-20: 20 mg via INTRAVENOUS
  Filled 2023-09-20: qty 2

## 2023-09-20 MED ORDER — MIDAZOLAM HCL 2 MG/2ML IJ SOLN
2.0000 mg | Freq: Once | INTRAMUSCULAR | Status: AC
  Administered 2023-09-20: 2 mg via INTRAVENOUS

## 2023-09-20 MED ORDER — FUROSEMIDE 10 MG/ML IJ SOLN: 40.0000 mg | Freq: Once | INTRAMUSCULAR | Status: DC

## 2023-09-20 MED ORDER — VECURONIUM BROMIDE 10 MG IV SOLR
10.0000 mg | Freq: Once | INTRAVENOUS | Status: AC
  Administered 2023-09-20: 10 mg via INTRAVENOUS
  Filled 2023-09-20: qty 10

## 2023-09-20 NOTE — Progress Notes (Signed)
Brief Progress Note  During WUA pt extremely tachypneic with accessory muscle use and tachycardia, unable to perform SBT.  Pt resedated with resolution of severe respiratory distress.  Family aware.  Will continue to monitor and assess pt.  Zada Girt, AGNP  Pulmonary/Critical Care Pager [phone] (please enter 7 digits) PCCM Consult Pager [phone] (please enter 7 digits)

## 2023-09-20 NOTE — Plan of Care (Signed)
  Problem: Education: Goal: Knowledge of General Education information will improve Description: Including pain rating scale, medication(s)/side effects and non-pharmacologic comfort measures Outcome: Not Progressing   Problem: Health Behavior/Discharge Planning: Goal: Ability to manage health-related needs will improve Outcome: Not Progressing   Problem: Activity: Goal: Risk for activity intolerance will decrease Outcome: Not Progressing   Problem: Nutrition: Goal: Adequate nutrition will be maintained Outcome: Not Progressing   Problem: Clinical Measurements: Goal: Ability to maintain clinical measurements within normal limits will improve Outcome: Not Progressing Goal: Will remain free from infection Outcome: Not Progressing Goal: Diagnostic test results will improve Outcome: Not Progressing Goal: Respiratory complications will improve Outcome: Not Progressing Goal: Cardiovascular complication will be avoided Outcome: Not Progressing   Problem: Nutrition: Goal: Adequate nutrition will be maintained Outcome: Not Progressing   Problem: Coping: Goal: Level of anxiety will decrease Outcome: Not Progressing   Problem: Elimination: Goal: Will not experience complications related to bowel motility Outcome: Not Progressing Goal: Will not experience complications related to urinary retention Outcome: Not Progressing   Problem: Pain Management: Goal: General experience of comfort will improve Outcome: Not Progressing   Problem: Safety: Goal: Ability to remain free from injury will improve Outcome: Not Progressing   Problem: Skin Integrity: Goal: Risk for impaired skin integrity will decrease Outcome: Not Progressing   Problem: Education: Goal: Knowledge of disease or condition will improve Outcome: Not Progressing Goal: Knowledge of the prescribed therapeutic regimen will improve Outcome: Not Progressing   Problem: Activity: Goal: Ability to tolerate increased  activity will improve Outcome: Not Progressing Goal: Will verbalize the importance of balancing activity with adequate rest periods Outcome: Not Progressing   Problem: Respiratory: Goal: Ability to maintain a clear airway will improve Outcome: Not Progressing Goal: Levels of oxygenation will improve Outcome: Not Progressing Goal: Ability to maintain adequate ventilation will improve Outcome: Not Progressing   Problem: Education: Goal: Ability to describe self-care measures that may prevent or decrease complications (Diabetes Survival Skills Education) will improve Outcome: Not Progressing   Problem: Coping: Goal: Ability to adjust to condition or change in health will improve Outcome: Not Progressing   Problem: Fluid Volume: Goal: Ability to maintain a balanced intake and output will improve Outcome: Not Progressing   Problem: Health Behavior/Discharge Planning: Goal: Ability to identify and utilize available resources and services will improve Outcome: Not Progressing Goal: Ability to manage health-related needs will improve Outcome: Not Progressing   Problem: Metabolic: Goal: Ability to maintain appropriate glucose levels will improve Outcome: Not Progressing   Problem: Nutritional: Goal: Maintenance of adequate nutrition will improve Outcome: Not Progressing Goal: Progress toward achieving an optimal weight will improve Outcome: Not Progressing   Problem: Skin Integrity: Goal: Risk for impaired skin integrity will decrease Outcome: Not Progressing   Problem: Tissue Perfusion: Goal: Adequacy of tissue perfusion will improve Outcome: Not Progressing   Problem: Activity: Goal: Ability to tolerate increased activity will improve Outcome: Not Progressing   Problem: Respiratory: Goal: Ability to maintain a clear airway and adequate ventilation will improve Outcome: Not Progressing   Problem: Role Relationship: Goal: Method of communication will improve Outcome:  Not Progressing

## 2023-09-20 NOTE — Progress Notes (Addendum)
NAME:  Benjamin Bolton, MRN:  [id number], DOB:  August 26, 1961, LOS: 10 ADMISSION DATE:  09/10/2023, CONSULTATION DATE: 09/13/2023  Brief Pt Description / Synopsis:  63 y.o. 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   History of Present Illness:  Mr. Benjamin Bolton is a 63 year old male with a history of severe COPD followed by [facility] Pulmonology who presents to the hospital from [facility] clinic on 1/8 with increased shortness of breath. He was admitted to the medicine service for management of COPD exacerbation and found to have an RSV infection.   Patient was admitted to [facility] and initiated on nebulizers, steroids, and antibiotics. He was managed medically for COPD exacerbation and UTI but developed worsening respiratory distress this AM requiring the initiation of BiPAP. Rapid response was called and he was transferred to the ICU. Initial blood gas showed pH of 7.2, with repeat VBG showing he remained acidemic at 7.18. Given altered mental status and respiratory failure, decision made at the bedside to proceed with emergent intubation.  Pertinent  Medical History  DVT on Eliquis COPD on 2L O2 Type II Diabetes Mellitus  GERD HLD Pulmonary Nodule Tobacco Abuse   Micro Data:  1/8: SARS-CoV-2 PCR>>negative 1/8: Blood culture x2>> no growth 1/8: Urine>> multiple species (suggest recollection) 1/9: RVP>> + RSV 1/9: Sputum>> normal respiratory flora 1/9: HIV Screen>> non reactive 1/11: MRSA PCR>> negative  Antimicrobials:   Anti-infectives (From admission, onward)    Start     Dose/Rate Route Frequency Ordered Stop   09/13/23 1445  piperacillin-tazobactam (ZOSYN) IVPB 3.375 g        3.375 g 12.5 mL/hr over 240 Minutes Intravenous Every 8 hours 09/13/23 1351 09/17/23 0157   09/10/23 2315   cefTRIAXone (ROCEPHIN) 1 g in sodium chloride 0.9 % 100 mL IVPB  Status:  Discontinued        1 g 200 mL/hr over 30 Minutes Intravenous Every 24 hours 09/10/23 2305 09/13/23 1336       Significant Hospital Events: Including procedures, antibiotic start and stop dates in addition to other pertinent events   1/08: admit to [facility] 1/11: rapid response, transfer to ICU, intubated 1/12: remains vented, sedated, does not follow commands.  Failed SBT  1/13: Overnight due to concerns of aspiration TF's held.  Will repeat SBT today 1/14: Overnight pt had possible seizure activity with rhythmic tremors noted in the mouth/lower face/neck during episode pt unresponsive to pain.  Received 2 mg of versed and 2g keppra bolus with resolution of symptoms.  CT Head negative.  Standing dose of keppra 1g bid.  Neurology consulted.    1/15: On minimal vent settings, unable to tolerate WUA due to increased WOB. Shifting measures given for Hyperkalemia of 5.5 with peaked T waves.  Abdomen distended, KUB without evidence of obstruction, give Lactulose.  Diurese with 40 mg IV Lasix x1. 1/16: Failed WUA, with increased WOB and hypoxic with O2 sats dropping to mid 70's.  Add Seroquel.  Palliative Care consulted to assist with GOC. 1/17: Precedex started for WUA.  Failed SBT due to increased WOB, tachypnea, and hypoxia.  Increase Seroquel to 50 mg BID. Transition Heparin to Elqiuis 1/18: Pt remains mechanically intubated and has failed multiple SBT's.  Planning for family meeting today at bedside with palliative care and PCCM will perform SBT once family  arrives at bedside.  Remains sedated with propofol and fentanyl gtts    Interim History / Subjective:  As outlined above under significant events   Objective   Blood pressure (!) 103/59, pulse (!) 106, temperature 98 F (36.7 C), temperature source Axillary, resp. rate 14, height 6' (1.829 m), weight 56.6 kg, SpO2 96%.    Vent Mode: PCV FiO2 (%):  [30 %] 30 % Set Rate:   [12 bmp] 12 bmp PEEP:  [5 cmH20] 5 cmH20 Plateau Pressure:  [19 cmH20-31 cmH20] 31 cmH20   Intake/Output Summary (Last 24 hours) at 09/20/2023 0943 Last data filed at 09/20/2023 7829 Gross per 24 hour  Intake 1297.72 ml  Output 2050 ml  Net -752.28 ml   Filed Weights   09/18/23 0345 09/19/23 0500 09/20/23 0400  Weight: 58 kg 60.6 kg 56.6 kg    Examination: General: Acutely-ill appearing male, NAD, mechanically intubated HENT: Supple, no JVD, orally intubated Lungs: Coarse breath sounds with expiratory wheezes at bases, even, occasionally overbreathing the vent Cardiovascular: NSR, no m/r/g, 2+ radial/1+ distal, no edema  Abdomen: Hypoactive BS x4, taut, distended  Extremities: Normal bulk and tone Neuro: Sedated, opens eyes spontaneously and to voice on commands but not following any other commands, PERRL GU: External catheter draining yellow urine with sediment   Resolved Hospital Problem list     Assessment & Plan:   #Acute toxic metabolic encephalopathy  #Mechanically intubation pain/discomfort  #Possible seizure activity~very low suspicion   - Correct metabolic derangements  - Avoid sedating medications as able  - Maintain RASS goal 0 to -1 - PAD protocol to maintain RASS goal: Propofol gtt and prn fentanyl/versed; will restart precedex gtt and stop propofol during SBT  - Scheduled valium for anxiety/delirium  - WUA daily - Continue thiamine and mvi  - Keppra was discontinued - Seizure precautions  - Continue seroquel to 50 mg BID to assist with WUA  #Mildly elevated troponin secondary to demand ischemia  #Hypotension suspect secondary to sedating medication~resolved Hx: HLD and DVT on Eliquis Echo 09/14/23: 50 to 55%, indeterminate diastolic parameters - Continuous telemetry monitoring  - Troponin peaked at 115 - No longer requiring vasopressors  - Continue apixaban for now  - Cardiology signed off no further cardiac evaluations indicated at this time    #Acute respiratory failure #AECOPD   #RSV Pneumonia  #Mechanical intubation  - Full vent support, implement lung protective strategies - Plateau pressures less than 30 cm H20 - Wean FiO2 & PEEP as tolerated to maintain O2 sats 88 to 92% - Follow intermittent Chest X-ray & ABG as needed - Spontaneous breathing trials when respiratory parameters met and mental status permits - Implement VAP Bundle - Bronchodilators and brovana - Continue prednisone taper  - Completed course of ABX as above  #Mild Hyperkalemia~RESOLVED #Hyponatremia~Improving   #Hypophosphatemia~RESOLVED - Monitor I&O's / urinary output - Follow BMP - Ensure adequate renal perfusion - Avoid nephrotoxic agents as able - Replace electrolytes as indicated ~ Pharmacy following for assistance with electrolyte replacement  #Anemia without obvious signs of acute blood loss  - Trend CBC  - Monitor for signs of bleeding  - Transfuse for hgb <7 - Apixaban for VTE px  #Type II diabetes mellitus  #Hypoglycemia~resolved - CBG's q4hrs  - Continue resistance SSI and scheduled novolog 5 units q4hrs  - Follow hypo/hyperglycemic protocol  - Target CBG's 140 to 180   Pt is critically ill with AECOPD in the setting of RSV and pneumonia.  Anticipate difficulty in  liberating from mechanical ventilation.  May end up needing Tracheostomy.  Will consult Palliative Care to assist with goals of care conversations. Best Practice (right click and "Reselect all SmartList Selections" daily)   Diet/type: TF's DVT prophylaxis Apixaban  Pressure ulcer(s): N/A GI prophylaxis: Pepcid  Lines: RUE PICC Line and still needed  Foley:  External Catheter  Code Status:  full code Last date of multidisciplinary goals of care discussion [09/20/2023]  01/18: Family meeting scheduled today to discuss code status and goals of care  Labs   CBC: Recent Labs  Lab 09/15/23 0833 09/16/23 0225 09/17/23 0412 09/18/23 0155 09/19/23 0556  09/20/23 0406  WBC 13.3* 12.6* 11.6* 10.3 7.5 9.4  NEUTROABS 11.0* 11.2* 9.7*  --   --   --   HGB 10.8* 11.3* 10.5* 9.5* 9.3* 9.8*  HCT 31.8* 34.2* 32.2* 29.7* 29.6* 30.6*  MCV 93.5 92.9 95.3 95.5 96.7 93.9  PLT 161 170 174 194 208 241    Basic Metabolic Panel: Recent Labs  Lab 09/15/23 0439 09/15/23 0833 09/16/23 0225 09/17/23 0412 09/17/23 1529 09/18/23 0155 09/18/23 0446 09/19/23 0556 09/20/23 0406  NA  --    < > 130* 133* 129*  --  130* 133* 133*  K  --    < > 4.9 5.5* 5.1  --  4.8 4.8 4.1  CL  --    < > 92* 90* 87*  --  87* 89* 92*  CO2  --    < > 27 31 30   --  35* 34* 33*  GLUCOSE  --    < > 267* 119* 253*  --  120* 107* 183*  BUN  --    < > 21 25* 25*  --  25* 23 26*  CREATININE  --    < > 0.82 0.73 0.63  --  0.52* 0.62 0.53*  CALCIUM  --    < > 8.7* 8.8* 8.6*  --  9.0 8.8* 8.8*  MG 2.2  --  2.5* 2.5*  --  2.4  --  2.2  --   PHOS 2.2*  --  2.4* 2.8  --   --  2.9 3.0 3.0   < > = values in this interval not displayed.   GFR: Estimated Creatinine Clearance: 77.6 mL/min (A) (by C-G formula based on SCr of 0.53 mg/dL (L)). Recent Labs  Lab 09/17/23 0412 09/18/23 0155 09/19/23 0556 09/20/23 0406  WBC 11.6* 10.3 7.5 9.4    Liver Function Tests: Recent Labs  Lab 09/13/23 1350 09/18/23 0446 09/19/23 0556 09/20/23 0406  AST 31  --   --   --   ALT 24  --   --   --   ALKPHOS 76  --   --   --   BILITOT 0.6  --   --   --   PROT 6.2*  --   --   --   ALBUMIN 3.4* 2.6* 2.4* 2.5*   No results for input(s): "LIPASE", "AMYLASE" in the last 168 hours. No results for input(s): "AMMONIA" in the last 168 hours.  ABG    Component Value Date/Time   PHART 7.43 09/16/2023 0816   PCO2ART 47 09/16/2023 0816   PO2ART 106 09/16/2023 0816   HCO3 31.2 (H) 09/16/2023 0816   O2SAT 100 09/16/2023 0816     Coagulation Profile: Recent Labs  Lab 09/13/23 1148  INR 1.1    Cardiac Enzymes: No results for input(s): "CKTOTAL", "CKMB", "CKMBINDEX", "TROPONINI" in the last  168 hours.  HbA1C: Hgb A1c MFr Bld  Date/Time Value Ref Range Status  09/15/2023 09:24 PM 6.4 (H) 4.8 - 5.6 % Final    Comment:    (NOTE) Pre diabetes:          5.7%-6.4%  Diabetes:              >6.4%  Glycemic control for   <7.0% adults with diabetes     CBG: Recent Labs  Lab 09/19/23 1557 09/19/23 1939 09/19/23 2329 09/20/23 0403 09/20/23 0740  GLUCAP 170* 164* 92 160* 118*    Review of Systems:   Unable to assess pt mechanically intubated   Past Medical History:  He,  has a past medical history of COPD (chronic obstructive pulmonary disease) (HCC), DVT (deep venous thrombosis) (HCC), Dyspnea, GERD (gastroesophageal reflux disease), HLD (hyperlipidemia), migraines, Oxygen dependent, Pneumonia (2023), Pre-diabetes, Pulmonary nodule, Rotator cuff tear, right, Tobacco abuse, and Tobacco use.   Surgical History:   Past Surgical History:  Procedure Laterality Date   CHEST TUBE INSERTION     SHOULDER ARTHROSCOPY WITH SUBACROMIAL DECOMPRESSION, ROTATOR CUFF REPAIR AND BICEP TENDON REPAIR Right 02/18/2023   Procedure: RIGHT SHOULDER ARTHROSCOPY WITH DEBRIDEMENT, DECOMPRESSION, ROTATOR CUFF REPAIR, BICEPS TENODESIS.;  Surgeon: Christena Flake, MD;  Location: [facility] ORS;  Service: Orthopedics;  Laterality: Right;     Social History:   reports that he has been smoking cigarettes. He has a 12 pack-year smoking history. He has never used smokeless tobacco. He reports current alcohol use. He reports that he does not use drugs.   Family History:  His family history is not on file.   Allergies No Active Allergies   Home Medications  Prior to Admission medications   Medication Sig Start Date End Date Taking? Authorizing Provider  albuterol (PROVENTIL) (2.5 MG/3ML) 0.083% nebulizer solution USE 1 VIAL IN NEBULIZER EVERY 6 HOURS AS NEEDED FOR WHEEZING 06/28/21  Yes [provider]  albuterol (VENTOLIN HFA) 108 (90 Base) MCG/ACT inhaler Inhale 1 puff into the lungs every 4  (four) hours as needed for wheezing or shortness of breath.   Yes [provider]  APIXABAN Everlene Balls) VTE STARTER PACK (10MG  AND 5MG ) Take as directed on package: start with two-5mg  tablets twice daily for 7 days. On day 8, switch to one-5mg  tablet twice daily. 02/26/23  Yes Merwyn Katos, MD  ondansetron (ZOFRAN) 4 MG tablet Take 1 tablet (4 mg total) by mouth every 6 (six) hours as needed for nausea. 02/19/23  Yes Anson Oregon, PA-C  oxyCODONE (OXY IR/ROXICODONE) 5 MG immediate release tablet Take 1 tablet by mouth 2 (two) times daily as needed. 08/29/23  Yes [provider]  OXYGEN Inhale 3 L into the lungs continuous.   Yes [provider]  predniSONE (DELTASONE) 10 MG tablet Take 2-6 tablets by mouth daily with breakfast.  ORIGINAL NWG:NFAO 6 tabs daily x4 days; then 4 tabs daily x4 days; then 2 tabs daily x4 days.   Yes [provider]  predniSONE (DELTASONE) 20 MG tablet Take 20 mg by mouth daily with breakfast.   Yes [provider]  TRELEGY ELLIPTA 100-62.5-25 MCG/ACT AEPB Inhale 1 puff into the lungs every morning. 07/04/21  Yes [provider]  amoxicillin-clavulanate (AUGMENTIN) 875-125 MG tablet Take 1 tablet by mouth every 12 (twelve) hours. Patient not taking: Reported on 09/10/2023    [provider]  benralizumab (FASENRA PEN) 30 MG/ML prefilled autoinjector Inject 30 mg into the skin as directed. Patient not taking: Reported on 09/10/2023  07/23/23   [provider]  nystatin (MYCOSTATIN) 100000 UNIT/ML suspension Take 5 mLs by mouth 4 (four) times daily. Patient not taking: Reported on 09/10/2023 09/04/23   [provider]  omeprazole (PRILOSEC) 20 MG capsule Take 20 mg by mouth daily. Patient not taking: Reported on 09/10/2023    [provider]  oxyCODONE (OXY IR/ROXICODONE) 5 MG immediate release tablet Take 1-2 tablets (5-10 mg total) by mouth every 4 (four) hours as needed for severe pain.  02/19/23   Anson Oregon, PA-C     Critical care time: 40 minutes      Zada Girt, [facility]  Pulmonary/Critical Care Pager [phone] (please enter 7 digits) PCCM Consult Pager [phone] (please enter 7 digits)

## 2023-09-20 NOTE — Progress Notes (Signed)
2130 - Pt with increased tachycardia (HR 130's) and tachypnea (RR 30's). Pt noted to be using accessory muscles and abdominal breathing. Dyssynchronous with vent. Provider notified and in to evaluate pt. CXR obtained. Versed 2mg  and Vecuronium 10mg  x1 ordered. Pt noted to have improved vent compliance. Improvement in tachycardia (110's) and tachypnea.   0345 - Pt with fighting vent and biting ETT. Multiple PRN Fentanyl boluses and a Versed bolus administered per orders (see EMAR). Pt repositioned and attempted to suction ETT and mouth without success as pt was biting on ETT. Ventilator alarming. Provider notified. Order received to administer Vecuronium 10mg  x1. Medication administer, pt noted to relax and no longer fighting ventilator.

## 2023-09-20 NOTE — Plan of Care (Signed)
  Problem: Nutrition: Goal: Adequate nutrition will be maintained Outcome: Progressing   Problem: Elimination: Goal: Will not experience complications related to bowel motility Outcome: Progressing Goal: Will not experience complications related to urinary retention Outcome: Progressing   Problem: Clinical Measurements: Goal: Respiratory complications will improve Outcome: Not Progressing   Problem: Activity: Goal: Risk for activity intolerance will decrease Outcome: Not Progressing   Problem: Coping: Goal: Level of anxiety will decrease Outcome: Not Progressing   Problem: Activity: Goal: Ability to tolerate increased activity will improve Outcome: Not Progressing   Problem: Respiratory: Goal: Ability to maintain a clear airway will improve Outcome: Not Progressing Goal: Levels of oxygenation will improve Outcome: Not Progressing Goal: Ability to maintain adequate ventilation will improve Outcome: Not Progressing

## 2023-09-20 NOTE — IPAL (Signed)
  Interdisciplinary Goals of Care Family Meeting   Date carried out: 09/20/2023  Location of the meeting: Conference room  Member's involved: Physician, Nurse Practitioner, Family Member or next of kin, and Palliative care team member  Durable Power of Attorney or acting medical decision maker: Family including His Son Minerva Areola, his wife and 2 sisters.     Discussion: We discussed goals of care for Barbara Cower .  I started with summarizing Mr. Isias course so far.  And relayed to them that he had very severe COPD with FEV1 of 0.9 L as a start.  And going through RSV pneumonia with aspiration pneumonia will make lung function recovery very poor.  We discussed also that he failed SBT 3 or 4 times.  Finally we discussed tracheostomy tube placement likely related to them that even if this was to happen he might not be able to reach a point where he can live the hospital to an LTAC given his poor lung function at baseline.  We agreed to take some time and think about the above and reconvene on Monday to discuss the steps moving forward.  At the end we touched base on resuscitation measures we all agreed that if got for bed his heart would stop beating then we will not proceed with CPR given poor chances of success and poor chances of going back to a good quality of life.  Patient CODE STATUS was changed to DNR.  Code status:   Code Status: Do not attempt resuscitation (DNR) PRE-ARREST INTERVENTIONS DESIRED   Disposition: Continue current acute care  Time spent for the meeting: 20    Janann Colonel, MD  09/20/2023, 3:02 PM

## 2023-09-20 NOTE — Progress Notes (Signed)
Daily Progress Note   Patient Name: Benjamin Bolton       Date: 09/20/2023 DOB: 04/11/1961  Age: 63 y.o. MRN#: [id number] Attending Physician: Janann Colonel, MD Primary Care Physician: [facility] Admit Date: 09/10/2023  Reason for Consultation/Follow-up: Establishing goals of care  HPI/Brief Hospital Review: 63 y.o. male  with past medical history of 2 diabetes, GERD, HLD, tobacco use disorder, COPD stage IV (2-3L [place] at home), DVT (Eliquis) admitted on 09/10/2023 with acute on chronic hypoxic and hypercapnic respiratory failure in the setting of AECOPD with RSV pneumonia.   1/11, rapid response, transferred to ICU, patient was intubated and sedated..  1/12, remains vented, sedated, does not follow commands.  Failed SBT  1/13, overnight due to concerns of aspiration TF's held.  Will repeat SBT today 1/14, overnight pt had possible seizure activity with rhythmic tremors noted in the mouth/lower face/neck during episode pt unresponsive to pain.  Received 2 mg of versed and 2g keppra bolus with resolution of symptoms.  CT Head negative.  Standing dose of keppra 1g bid.  Neurology consulted.    1/15, on minimal vent settings, unable to tolerate WUA due to increased WOB. Shifting measures given for Hyperkalemia of 5.5 with peaked T waves.  Abdomen distended, KUB without evidence of obstruction, give Lactulose.  Diurese with 40 mg IV Lasix x1. 1/16, Failed WUA, with increased WOB and hypoxic with O2 sats dropping to mid 70's.  Add Seroquel.   1/17-1/18 failed WUA/SBT  Palliative Care consulted to assist with GOC.  Subjective: Extensive chart review has been completed prior to meeting patient including labs, vital signs, imaging, progress notes, orders, and available advanced directive  documents from current and previous encounters.    Visited with Benjamin Bolton at his bedside. He remains intubated and sedated. Nursing attempting WUA/SBT.  Met with family in collaboration with ICU team-Dr. Larinda Buttery and Zada Girt, NP. Wife, son and 2 sisters present during visit-met in family conference room. ICU team provided medical updates. Plan of care also addressed, continuing to perform WUA/SBT but concern remains for Benjamin Bolton ability to be liberated from ventilatory support.  Pursuing trach was addressed, discussed need for PEG and likely long term ventilatory support based on poor lung function at baseline for which Benjamin Bolton would need skilled nursing facility placement long term.  Goals  of care were addressed, family shares they feel Benjamin Bolton would be accepting of life preserving measures as they feel he would want to continuing living.  Transitioning to comfort care was also addressed. Discussed Benjamin Bolton would no longer receive aggressive medical interventions such as continuous vital signs, lab work, radiology testing, or medications not focused on comfort. All care would focus on how the patient is looking and feeling. This would include management of any symptoms that may cause discomfort, pain, shortness of breath, cough, nausea, agitation, anxiety, and/or secretions etc. Symptoms would be managed with medications and other non-pharmacological interventions such as spiritual support if requested, repositioning, music therapy, or therapeutic listening. Family verbalized understanding and appreciation.   Code status was also addressed and the difference between Full Code and Do Not Resuscitate. Family came to the decision for DNR status at this time, order placed to reflect family wishes.  Decision for trach/PEG ongoing. Encouraged family to continue conversations amongst themselves and to focus decision making based on Benjamin Bolton wishes for himself.  Plan set for family to meet  with PMT and CCM team Monday afternoon-tentatively set for 1PM.  Answered and addressed all questions and concerns. PMT to continue to follow for ongoing needs and support.  Care plan was discussed with CCM team and nursing staff.  Thank you for allowing the Palliative Medicine Team to assist in the care of this patient.  Total time:  35 minutes  Time spent includes: Detailed review of medical records (labs, imaging, vital signs), medically appropriate exam (mental status, respiratory, cardiac, skin), discussed with treatment team, counseling and educating patient, family and staff, documenting clinical information, medication management and coordination of care.  Leeanne Deed, DNP, AGNP-C Palliative Medicine   Please contact Palliative Medicine Team phone at [phone] for questions and concerns.

## 2023-09-21 ENCOUNTER — Inpatient Hospital Stay: Payer: 59

## 2023-09-21 DIAGNOSIS — J9621 Acute and chronic respiratory failure with hypoxia: Secondary | ICD-10-CM | POA: Diagnosis not present

## 2023-09-21 DIAGNOSIS — J441 Chronic obstructive pulmonary disease with (acute) exacerbation: Secondary | ICD-10-CM | POA: Diagnosis not present

## 2023-09-21 DIAGNOSIS — J9622 Acute and chronic respiratory failure with hypercapnia: Secondary | ICD-10-CM | POA: Diagnosis not present

## 2023-09-21 DIAGNOSIS — Z515 Encounter for palliative care: Secondary | ICD-10-CM | POA: Diagnosis not present

## 2023-09-21 DIAGNOSIS — G9341 Metabolic encephalopathy: Secondary | ICD-10-CM | POA: Diagnosis not present

## 2023-09-21 LAB — RENAL FUNCTION PANEL
Albumin: 2.5 g/dL — ABNORMAL LOW (ref 3.5–5.0)
Anion gap: 10 (ref 5–15)
BUN: 30 mg/dL — ABNORMAL HIGH (ref 8–23)
CO2: 32 mmol/L (ref 22–32)
Calcium: 8.4 mg/dL — ABNORMAL LOW (ref 8.9–10.3)
Chloride: 91 mmol/L — ABNORMAL LOW (ref 98–111)
Creatinine, Ser: 0.51 mg/dL — ABNORMAL LOW (ref 0.61–1.24)
GFR, Estimated: >60 mL/min (ref >60)
Glucose, Bld: 169 mg/dL — ABNORMAL HIGH (ref 70–99)
Phosphorus: 3.2 mg/dL (ref 2.5–4.6)
Potassium: 4.1 mmol/L (ref 3.5–5.1)
Sodium: 133 mmol/L — ABNORMAL LOW (ref 135–145)

## 2023-09-21 LAB — GLUCOSE, CAPILLARY
Glucose-Capillary: 109 mg/dL — ABNORMAL HIGH (ref 70–99)
Glucose-Capillary: 126 mg/dL — ABNORMAL HIGH (ref 70–99)
Glucose-Capillary: 149 mg/dL — ABNORMAL HIGH (ref 70–99)
Glucose-Capillary: 154 mg/dL — ABNORMAL HIGH (ref 70–99)
Glucose-Capillary: 168 mg/dL — ABNORMAL HIGH (ref 70–99)
Glucose-Capillary: 173 mg/dL — ABNORMAL HIGH (ref 70–99)

## 2023-09-21 LAB — URINALYSIS, COMPLETE (UACMP) WITH MICROSCOPIC
Bacteria, UA: NONE SEEN
Bilirubin Urine: NEGATIVE
Glucose, UA: 50 mg/dL — AB
Hgb urine dipstick: NEGATIVE
Ketones, ur: NEGATIVE mg/dL
Nitrite: NEGATIVE
Protein, ur: NEGATIVE mg/dL
Specific Gravity, Urine: 1.024 (ref 1.005–1.030)
pH: 5 (ref 5.0–8.0)

## 2023-09-21 LAB — TRIGLYCERIDES
Triglycerides: 562 mg/dL — ABNORMAL HIGH (ref <150)
Triglycerides: 423 mg/dL — ABNORMAL HIGH (ref <150)

## 2023-09-21 LAB — MRSA NEXT GEN BY PCR, NASAL: MRSA by PCR Next Gen: NOT DETECTED

## 2023-09-21 LAB — CBC
HCT: 30 % — ABNORMAL LOW (ref 39.0–52.0)
Hemoglobin: 9.6 g/dL — ABNORMAL LOW (ref 13.0–17.0)
MCH: 31.3 pg (ref 26.0–34.0)
MCHC: 32 g/dL (ref 30.0–36.0)
MCV: 97.7 fL (ref 80.0–100.0)
Platelets: 309 10*3/uL (ref 150–400)
RBC: 3.07 MIL/uL — ABNORMAL LOW (ref 4.22–5.81)
RDW: 15 % (ref 11.5–15.5)
WBC: 9.1 10*3/uL (ref 4.0–10.5)
nRBC: 0.5 % — ABNORMAL HIGH (ref 0.0–0.2)

## 2023-09-21 LAB — LACTIC ACID, PLASMA: Lactic Acid, Venous: 1 mmol/L (ref 0.5–1.9)

## 2023-09-21 LAB — CORTISOL: Cortisol, Plasma: 5.5 ug/dL

## 2023-09-21 LAB — PROCALCITONIN: Procalcitonin: 1.55 ng/mL

## 2023-09-21 MED ORDER — LACTATED RINGERS IV BOLUS
500.0000 mL | Freq: Once | INTRAVENOUS | Status: AC
  Administered 2023-09-21: 500 mL via INTRAVENOUS

## 2023-09-21 MED ORDER — VECURONIUM BROMIDE 10 MG IV SOLR: 10.0000 mg | Freq: Once | INTRAVENOUS | Status: AC

## 2023-09-21 MED ORDER — STERILE WATER FOR INJECTION IJ SOLN
INTRAMUSCULAR | Status: AC
  Administered 2023-09-21: 10 mL
  Filled 2023-09-21: qty 10

## 2023-09-21 MED ORDER — VANCOMYCIN HCL IN DEXTROSE 1-5 GM/200ML-% IV SOLN
1000.0000 mg | Freq: Once | INTRAVENOUS | Status: AC
  Administered 2023-09-21: 1000 mg via INTRAVENOUS
  Filled 2023-09-21: qty 200

## 2023-09-21 MED ORDER — PIPERACILLIN-TAZOBACTAM 3.375 G IVPB
3.3750 g | Freq: Three times a day (TID) | INTRAVENOUS | Status: DC
  Administered 2023-09-21 – 2023-09-23 (×6): 3.375 g via INTRAVENOUS
  Filled 2023-09-21 (×5): qty 50

## 2023-09-21 MED ORDER — IPRATROPIUM-ALBUTEROL 0.5-2.5 (3) MG/3ML IN SOLN
3.0000 mL | Freq: Four times a day (QID) | RESPIRATORY_TRACT | Status: DC
  Administered 2023-09-22: 3 mL via RESPIRATORY_TRACT
  Filled 2023-09-21: qty 3

## 2023-09-21 MED ORDER — VANCOMYCIN HCL 750 MG/150ML IV SOLN
750.0000 mg | Freq: Two times a day (BID) | INTRAVENOUS | Status: DC
  Filled 2023-09-21: qty 150

## 2023-09-21 MED ORDER — VANCOMYCIN HCL 1250 MG/250ML IV SOLN
1250.0000 mg | Freq: Once | INTRAVENOUS | Status: DC
  Filled 2023-09-21: qty 250

## 2023-09-21 MED ORDER — KETAMINE HCL-SODIUM CHLORIDE 1000-0.9 MG/100ML-% IV SOLN
1.0000 mg/kg/h | INTRAVENOUS | Status: DC
  Administered 2023-09-21: 2 mg/kg/h via INTRAVENOUS
  Administered 2023-09-21: 1 mg/kg/h via INTRAVENOUS
  Administered 2023-09-22: 2 mg/kg/h via INTRAVENOUS
  Filled 2023-09-21 (×3): qty 100

## 2023-09-21 MED ORDER — PIPERACILLIN-TAZOBACTAM 3.375 G IVPB
3.3750 g | Freq: Once | INTRAVENOUS | Status: DC
  Filled 2023-09-21: qty 50

## 2023-09-21 MED ORDER — VECURONIUM BROMIDE 10 MG IV SOLR
INTRAVENOUS | Status: AC
  Administered 2023-09-21: 10 mg via INTRAVENOUS
  Filled 2023-09-21: qty 10

## 2023-09-21 MED ORDER — ROCURONIUM BROMIDE 10 MG/ML (PF) SYRINGE
PREFILLED_SYRINGE | INTRAVENOUS | Status: AC
  Administered 2023-09-21: 60 mg
  Filled 2023-09-21: qty 10

## 2023-09-21 MED ORDER — VANCOMYCIN HCL IN DEXTROSE 750-5 MG/150ML-% IV SOLN
750.0000 mg | Freq: Two times a day (BID) | INTRAVENOUS | Status: DC
Start: 2023-09-22 — End: 2023-09-21
  Filled 2023-09-21: qty 150

## 2023-09-21 MED ORDER — MIDAZOLAM HCL 2 MG/2ML IJ SOLN
INTRAMUSCULAR | Status: AC
  Filled 2023-09-21: qty 2

## 2023-09-21 NOTE — Procedures (Signed)
Endotracheal Intubation: Patient required placement of an artificial airway secondary to blown cuff Consent: Emergent.    Hand washing performed prior to starting the procedure.    Medications administered for sedation prior to procedure:  Fentanyl 50 mcg IV, Rocuronium 60 mg IV, continuous propofol/fentanyl      A time out procedure was called and correct patient, name, & ID confirmed. Needed supplies and equipment were assembled and checked to include ETT, 10 ml syringe, Glidescope, Mac and Miller blades, suction, oxygen and bag mask valve, end tidal CO2 monitor.    Patient was positioned to align the mouth and pharynx to facilitate visualization of the glottis.    Heart rate, SpO2 and blood pressure was continuously monitored during the procedure. Pre-oxygenation was conducted prior to intubation and endotracheal tube was placed through the vocal cords into the trachea.       The artificial airway was placed under direct visualization via glidescope route using a 7.5 ETT on the first attempt.   ETT was secured at 27 cm.   Placement was confirmed by auscuitation of lungs with good breath sounds bilaterally and no stomach sounds.  Condensation was noted on endotracheal tube.   Pulse ox 100%  CO2 detector in place with appropriate color change.    Complications: None .        Chest radiograph ordered and pending.   Zada Girt, AGNP  Pulmonary/Critical Care Pager [phone] (please enter 7 digits) PCCM Consult Pager [phone] (please enter 7 digits)

## 2023-09-21 NOTE — Progress Notes (Signed)
Daily Progress Note   Patient Name: Benjamin Bolton       Date: 09/21/2023 DOB: 02-Jan-1961  Age: 63 y.o. MRN#: [id number] Attending Physician: Janann Colonel, MD Primary Care Physician: [facility] Admit Date: 09/10/2023  Reason for Consultation/Follow-up: Establishing goals of care  HPI/Brief Hospital Review: 63 y.o. male  with past medical history of 2 diabetes, GERD, HLD, tobacco use disorder, COPD stage IV (2-3L [place] at home), DVT (Eliquis) admitted on 09/10/2023 with acute on chronic hypoxic and hypercapnic respiratory failure in the setting of AECOPD with RSV pneumonia.   1/11, rapid response, transferred to ICU, patient was intubated and sedated..  1/12, remains vented, sedated, does not follow commands.  Failed SBT  1/13, overnight due to concerns of aspiration TF's held.  Will repeat SBT today 1/14, overnight pt had possible seizure activity with rhythmic tremors noted in the mouth/lower face/neck during episode pt unresponsive to pain.  Received 2 mg of versed and 2g keppra bolus with resolution of symptoms.  CT Head negative.  Standing dose of keppra 1g bid.  Neurology consulted.    1/15, on minimal vent settings, unable to tolerate WUA due to increased WOB. Shifting measures given for Hyperkalemia of 5.5 with peaked T waves.  Abdomen distended, KUB without evidence of obstruction, give Lactulose.  Diurese with 40 mg IV Lasix x1. 1/16, Failed WUA, with increased WOB and hypoxic with O2 sats dropping to mid 70's.  Add Seroquel.   1/17-1/19 failed WUA/SBT 1/19 concern for recurrent sepsis due to fevers developing overnight and increased vasopressor support  Palliative Care consulted to assist with GOC.  Subjective: Extensive chart review has been completed prior to meeting  patient including labs, vital signs, imaging, progress notes, orders, and available advanced directive documents from current and previous encounters.    Visited with Benjamin Bolton at his bedside. Remains intubated and sedated. Collaborated with CCM team, concern to proceed with trach placement due to concern for recurrent sepsis and overall worsening condition. No family at bedside during time of visit.  Meeting set by CCM team for collaborative meeting with family Monday at 1PM.  PMT to continue to follow for ongoing needs and support.  Care plan was discussed with CCM team and nursing staff  Thank you for allowing the Palliative Medicine Team to assist in the care of  this patient.  Total time:  25 minutes  Time spent includes: Detailed review of medical records (labs, imaging, vital signs), medically appropriate exam (mental status, respiratory, cardiac, skin), discussed with treatment team, counseling and educating patient, family and staff, documenting clinical information, medication management and coordination of care.  Benjamin Deed, DNP, AGNP-C Palliative Medicine   Please contact Palliative Medicine Team phone at [phone] for questions and concerns.

## 2023-09-21 NOTE — Plan of Care (Signed)
  Problem: Education: Goal: Knowledge of General Education information will improve Description: Including pain rating scale, medication(s)/side effects and non-pharmacologic comfort measures Outcome: Not Progressing   Problem: Health Behavior/Discharge Planning: Goal: Ability to manage health-related needs will improve Outcome: Not Progressing   Problem: Clinical Measurements: Goal: Ability to maintain clinical measurements within normal limits will improve Outcome: Not Progressing Goal: Will remain free from infection Outcome: Not Progressing Goal: Diagnostic test results will improve Outcome: Not Progressing Goal: Respiratory complications will improve Outcome: Not Progressing Goal: Cardiovascular complication will be avoided Outcome: Not Progressing   Problem: Activity: Goal: Risk for activity intolerance will decrease Outcome: Not Progressing   Problem: Nutrition: Goal: Adequate nutrition will be maintained Outcome: Not Progressing   Problem: Coping: Goal: Level of anxiety will decrease Outcome: Not Progressing   Problem: Elimination: Goal: Will not experience complications related to bowel motility Outcome: Not Progressing Goal: Will not experience complications related to urinary retention Outcome: Not Progressing   Problem: Pain Management: Goal: General experience of comfort will improve Outcome: Not Progressing   Problem: Safety: Goal: Ability to remain free from injury will improve Outcome: Not Progressing   Problem: Skin Integrity: Goal: Risk for impaired skin integrity will decrease Outcome: Not Progressing   Problem: Education: Goal: Knowledge of disease or condition will improve Outcome: Not Progressing Goal: Knowledge of the prescribed therapeutic regimen will improve Outcome: Not Progressing   Problem: Activity: Goal: Ability to tolerate increased activity will improve Outcome: Not Progressing Goal: Will verbalize the importance of  balancing activity with adequate rest periods Outcome: Not Progressing   Problem: Respiratory: Goal: Ability to maintain a clear airway will improve Outcome: Not Progressing Goal: Levels of oxygenation will improve Outcome: Not Progressing Goal: Ability to maintain adequate ventilation will improve Outcome: Not Progressing   Problem: Education: Goal: Ability to describe self-care measures that may prevent or decrease complications (Diabetes Survival Skills Education) will improve Outcome: Not Progressing   Problem: Coping: Goal: Ability to adjust to condition or change in health will improve Outcome: Not Progressing   Problem: Fluid Volume: Goal: Ability to maintain a balanced intake and output will improve Outcome: Not Progressing   Problem: Health Behavior/Discharge Planning: Goal: Ability to identify and utilize available resources and services will improve Outcome: Not Progressing Goal: Ability to manage health-related needs will improve Outcome: Not Progressing   Problem: Metabolic: Goal: Ability to maintain appropriate glucose levels will improve Outcome: Not Progressing   Problem: Nutritional: Goal: Maintenance of adequate nutrition will improve Outcome: Not Progressing Goal: Progress toward achieving an optimal weight will improve Outcome: Not Progressing   Problem: Skin Integrity: Goal: Risk for impaired skin integrity will decrease Outcome: Not Progressing   Problem: Tissue Perfusion: Goal: Adequacy of tissue perfusion will improve Outcome: Not Progressing   Problem: Activity: Goal: Ability to tolerate increased activity will improve Outcome: Not Progressing   Problem: Respiratory: Goal: Ability to maintain a clear airway and adequate ventilation will improve Outcome: Not Progressing   Problem: Role Relationship: Goal: Method of communication will improve Outcome: Not Progressing

## 2023-09-21 NOTE — Progress Notes (Signed)
Pharmacy Antibiotic Note  Benjamin Bolton is a 63 y.o. male w/ PMH of DVT(on apixaban), T2DM, HLD, tobacco use, stage IV COPD with FEV1 0.91 L 26% of predicted in 2022 complicated by chronic hypoxic respiratory failure on 2 to 3 L nasal cannula currently on Fasenra admitted on 09/10/2023 with pneumonia.  Pharmacy has been consulted for vancomycin and Zosyn dosing.  Plan:  1) start Zosyn 3.375g IV q8h (4 hour infusion). ---follow renal function for needed dose adjustments  2) start vancomycin 1000 mg IV x 1 then 750 mg IV every 12 hours Goal AUC 400-550. Expected AUC: 527.5 SCr used: 0.80 mg/dL Ke 1.610R-6, E4/5 40J   Height: 6' (182.9 cm) Weight: 57.2 kg (126 lb 1.7 oz) IBW/kg (Calculated) : 77.6  Temp (24hrs), Avg:99.1 F (37.3 C), Min:97.2 F (36.2 C), Max:101 F (38.3 C)  Recent Labs  Lab 09/17/23 0412 09/17/23 1529 09/18/23 0155 09/18/23 0446 09/19/23 0556 09/20/23 0406 09/21/23 0333  WBC 11.6*  --  10.3  --  7.5 9.4 9.1  CREATININE 0.73 0.63  --  0.52* 0.62 0.53* 0.51*  LATICACIDVEN  --   --   --   --   --   --  1.0    Estimated Creatinine Clearance: 78.5 mL/min (A) (by C-G formula based on SCr of 0.51 mg/dL (L)).    No Active Allergies  Antimicrobials this admission: 01/08 ceftriaxone >> 01/10 01/11 Zosyn >> 01/14  01/19 >> 01/19 vancomycin >>   Microbiology results: 01/08 BCx: NG final 01/09 RCx normal respiratory flora  01/08 UCx: contaminated 01/09 RSV detected  01/19 Sputum: pending  01/11 MRSA PCR: negative 01/19 MRSA PCR: pending  Thank you for allowing pharmacy to be a part of this patient's care.  Lowella Bandy 09/21/2023 11:05 AM

## 2023-09-21 NOTE — Progress Notes (Addendum)
NAME:  Benjamin Bolton, MRN:  [id number], DOB:  [birth year], LOS: 11 ADMISSION DATE:  09/10/2023, CONSULTATION DATE: 09/13/2023  Brief Pt Description / Synopsis:  63 y.o. 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   History of Present Illness:  Benjamin Bolton is a 63 year old male with a history of severe COPD followed by [facility] Pulmonology who presents to the hospital from [facility] clinic on 1/8 with increased shortness of breath. He was admitted to the medicine service for management of COPD exacerbation and found to have an RSV infection.   Patient was admitted to [facility] and initiated on nebulizers, steroids, and antibiotics. He was managed medically for COPD exacerbation and UTI but developed worsening respiratory distress this AM requiring the initiation of BiPAP. Rapid response was called and he was transferred to the ICU. Initial blood gas showed pH of 7.2, with repeat VBG showing he remained acidemic at 7.18. Given altered mental status and respiratory failure, decision made at the bedside to proceed with emergent intubation.  Pertinent  Medical History  DVT on Eliquis COPD on 2L O2 Type II Diabetes Mellitus  GERD HLD Pulmonary Nodule Tobacco Abuse   Micro Data:  1/8: SARS-CoV-2 PCR>>negative 1/8: Blood culture x2>> no growth 1/8: Urine>> multiple species (suggest recollection) 1/9: RVP>> + RSV 1/9: Sputum>> normal respiratory flora 1/9: HIV Screen>> non reactive 1/11: MRSA PCR>> negative 1/19: MRSA PCR>> 1/19: RVP>>  Antimicrobials:   Anti-infectives (From admission, onward)    Start     Dose/Rate Route Frequency Ordered Stop   09/21/23 1130  vancomycin (VANCOREADY) IVPB 1250 mg/250 mL        1,250 mg 166.7 mL/hr over 90 Minutes Intravenous  Once 09/21/23 1046      09/21/23 1100  piperacillin-tazobactam (ZOSYN) IVPB 3.375 g        3.375 g 12.5 mL/hr over 240 Minutes Intravenous  Once 09/21/23 1046     09/13/23 1445  piperacillin-tazobactam (ZOSYN) IVPB 3.375 g        3.375 g 12.5 mL/hr over 240 Minutes Intravenous Every 8 hours 09/13/23 1351 09/17/23 0157   09/10/23 2315  cefTRIAXone (ROCEPHIN) 1 g in sodium chloride 0.9 % 100 mL IVPB  Status:  Discontinued        1 g 200 mL/hr over 30 Minutes Intravenous Every 24 hours 09/10/23 2305 09/13/23 1336      Significant Hospital Events: Including procedures, antibiotic start and stop dates in addition to other pertinent events   1/08: admit to [facility] 1/11: rapid response, transfer to ICU, intubated 1/12: remains vented, sedated, does not follow commands.  Failed SBT  1/13: Overnight due to concerns of aspiration TF's held.  Will repeat SBT today 1/14: Overnight pt had possible seizure activity with rhythmic tremors noted in the mouth/lower face/neck during episode pt unresponsive to pain.  Received 2 mg of versed and 2g keppra bolus with resolution of symptoms.  CT Head negative.  Standing dose of keppra 1g bid.  Neurology consulted.    1/15: On minimal vent settings, unable to tolerate WUA due to increased WOB. Shifting measures given for Hyperkalemia of 5.5 with peaked T waves.  Abdomen distended, KUB without evidence of obstruction, give Lactulose.  Diurese with 40 mg IV Lasix x1. 1/16: Failed WUA, with increased WOB and hypoxic with O2 sats dropping to  mid 70's.  Add Seroquel.  Palliative Care consulted to assist with GOC. 1/17: Precedex started for WUA.  Failed SBT due to increased WOB, tachypnea, and hypoxia.  Increase Seroquel to 50 mg BID. Transition Heparin to Elqiuis 1/18: Pt remains mechanically intubated and has failed multiple SBT's.  Planning for family meeting today at bedside with palliative care and PCCM will perform SBT once family arrives at bedside.  Remains sedated with propofol and fentanyl  gtts.  Family meeting held code status changed to DNR per family request.  Pts family trying to decide if they want to proceed with tracheostomy.  Required additional sedation and prn vecuronium overnight due to vent dyssynchrony  1/19: Pt febrile overnight with increased vasopressor requirements levophed gtt @12  mcg/min and febrile tmax 101 degrees F concerning for recurrent sepsis.  Broad spectrum abx started. Pt remains mechanically intubated and unable to successfully liberate from ventilator   Interim History / Subjective:  As outlined above under significant events   Objective   Blood pressure 98/60, pulse (!) 105, temperature 99.8 F (37.7 C), temperature source Oral, resp. rate 14, height 6' (1.829 m), weight 57.2 kg, SpO2 96%.    Vent Mode: PRVC FiO2 (%):  [30 %] 30 % Set Rate:  [12 bmp] 12 bmp Vt Set:  [570 mL] 570 mL PEEP:  [5 cmH20-8 cmH20] 5 cmH20 Plateau Pressure:  [28 cmH20] 28 cmH20   Intake/Output Summary (Last 24 hours) at 09/21/2023 1040 Last data filed at 09/21/2023 1000 Gross per 24 hour  Intake 2068.13 ml  Output 1525 ml  Net 543.13 ml   Filed Weights   09/19/23 0500 09/20/23 0400 09/21/23 0400  Weight: 60.6 kg 56.6 kg 57.2 kg    Examination: General: Acutely-ill appearing male, NAD, mechanically intubated HENT: Supple, no JVD, orally intubated Lungs: Diffuse rhonchi throughout with expiratory wheezes at the bases, even, occasionally overbreathing the vent Cardiovascular: Sinus tachycardia, no m/r/g, 2+ radial/1+ distal, no edema  Abdomen: Hypoactive BS x4, taut, distended  Extremities: Normal bulk and tone Neuro: Sedated, withdraws from painful stimulation, PERRL GU: External catheter draining yellow urine with sediment   Resolved Hospital Problem list     Assessment & Plan:   #Acute toxic metabolic encephalopathy  #Mechanically intubation pain/discomfort  #Possible seizure activity~very low suspicion   - Correct metabolic derangements  - Avoid  sedating medications as able  - Maintain RASS goal 0 to -1 - PAD protocol to maintain RASS goal: propofol gtt and prn fentanyl/versed; will restart precedex gtt and stop propofol during SBT  - Scheduled valium for anxiety/delirium  - WUA daily - Continue thiamine and mvi  - Keppra was discontinued - Seizure precautions  - Continue seroquel to 50 mg BID to assist with WUA  #Mildly elevated troponin secondary to demand ischemia  Hypotension suspect secondary to possible sepsis and sedating medication  Hx: HLD and DVT on Eliquis Echo 09/14/23: 50 to 55%, indeterminate diastolic parameters - Continuous telemetry monitoring  - Troponin peaked at 115 - IV fluid resuscitation and levophed gtt to maintain map 65 or higher  - Continue apixaban for now  - Cardiology signed off no further cardiac evaluations indicated at this time   #Acute respiratory failure #AECOPD   #RSV Pneumonia  #Mechanical intubation  - Full vent support, implement lung protective strategies - Plateau pressures less than 30 cm H20 - Wean FiO2 & PEEP as tolerated to maintain O2 sats 88 to 92% - Follow intermittent Chest X-ray & ABG as needed - Spontaneous breathing  trials when respiratory parameters met and mental status permits - Implement VAP Bundle - Bronchodilators and brovana - Continue prednisone taper  - Restarting broad spectrum abx  - Family trying to decide if they would like to proceed with tracheostomy placement   #Mild Hyperkalemia~RESOLVED #Hyponatremia~Improving   #Hypophosphatemia~RESOLVED - Monitor I&O's / urinary output - Follow BMP - Ensure adequate renal perfusion - Avoid nephrotoxic agents as able - Replace electrolytes as indicated ~ Pharmacy following for assistance with electrolyte replacement  #Possible sepsis  - Trend WBC and monitor fever curve  - Trend PCT  - Will repeat UA/tracheal aspirate/respiratory viral panel  - Will restart vancomycin and zosyn   #Anemia without obvious  signs of acute blood loss  - Trend CBC  - Monitor for signs of bleeding  - Transfuse for hgb <7 - Apixaban for VTE px  #Type II diabetes mellitus  #Hypoglycemia~resolved - CBG's q4hrs  - Continue resistance SSI and scheduled novolog 5 units q4hrs  - Follow hypo/hyperglycemic protocol  - Target CBG's 140 to 180   Pt is critically ill with AECOPD in the setting of RSV and pneumonia.  Anticipate difficulty in liberating from mechanical ventilation.  May end up needing Tracheostomy.  Scheduled meeting on Monday 09/22/23 at 1300  Best Practice (right click and "Reselect all SmartList Selections" daily)   Diet/type: TF's DVT prophylaxis Apixaban  Pressure ulcer(s): N/A GI prophylaxis: Pepcid  Lines: RUE PICC Line and still needed  Foley:  External Catheter  Code Status: DNR Last date of multidisciplinary goals of care discussion [09/21/2023]  01/19: Family meeting scheduled on Monday 09/22/23 at 1300 to discuss tracheostomy placement  Labs   CBC: Recent Labs  Lab 09/15/23 0833 09/16/23 0225 09/17/23 0412 09/18/23 0155 09/19/23 0556 09/20/23 0406 09/21/23 0333  WBC 13.3* 12.6* 11.6* 10.3 7.5 9.4 9.1  NEUTROABS 11.0* 11.2* 9.7*  --   --   --   --   HGB 10.8* 11.3* 10.5* 9.5* 9.3* 9.8* 9.6*  HCT 31.8* 34.2* 32.2* 29.7* 29.6* 30.6* 30.0*  MCV 93.5 92.9 95.3 95.5 96.7 93.9 97.7  PLT 161 170 174 194 208 241 309    Basic Metabolic Panel: Recent Labs  Lab 09/15/23 0439 09/15/23 0833 09/16/23 0225 09/17/23 0412 09/17/23 1529 09/18/23 0155 09/18/23 0446 09/19/23 0556 09/20/23 0406 09/21/23 0333  NA  --    < > 130* 133* 129*  --  130* 133* 133* 133*  K  --    < > 4.9 5.5* 5.1  --  4.8 4.8 4.1 4.1  CL  --    < > 92* 90* 87*  --  87* 89* 92* 91*  CO2  --    < > 27 31 30   --  35* 34* 33* 32  GLUCOSE  --    < > 267* 119* 253*  --  120* 107* 183* 169*  BUN  --    < > 21 25* 25*  --  25* 23 26* 30*  CREATININE  --    < > 0.82 0.73 0.63  --  0.52* 0.62 0.53* 0.51*  CALCIUM   --    < > 8.7* 8.8* 8.6*  --  9.0 8.8* 8.8* 8.4*  MG 2.2  --  2.5* 2.5*  --  2.4  --  2.2  --   --   PHOS 2.2*  --  2.4* 2.8  --   --  2.9 3.0 3.0 3.2   < > = values in this interval not displayed.  GFR: Estimated Creatinine Clearance: 78.5 mL/min (A) (by C-G formula based on SCr of 0.51 mg/dL (L)). Recent Labs  Lab 09/18/23 0155 09/19/23 0556 09/20/23 0406 09/21/23 0333  WBC 10.3 7.5 9.4 9.1  LATICACIDVEN  --   --   --  1.0    Liver Function Tests: Recent Labs  Lab 09/18/23 0446 09/19/23 0556 09/20/23 0406 09/21/23 0333  ALBUMIN 2.6* 2.4* 2.5* 2.5*   No results for input(s): "LIPASE", "AMYLASE" in the last 168 hours. No results for input(s): "AMMONIA" in the last 168 hours.  ABG    Component Value Date/Time   PHART 7.43 09/20/2023 2332   PCO2ART 62 (H) 09/20/2023 2332   PO2ART 53 (L) 09/20/2023 2332   HCO3 41.2 (H) 09/20/2023 2332   O2SAT 86.6 09/20/2023 2332     Coagulation Profile: No results for input(s): "INR", "PROTIME" in the last 168 hours.   Cardiac Enzymes: No results for input(s): "CKTOTAL", "CKMB", "CKMBINDEX", "TROPONINI" in the last 168 hours.  HbA1C: Hgb A1c MFr Bld  Date/Time Value Ref Range Status  09/15/2023 09:24 PM 6.4 (H) 4.8 - 5.6 % Final    Comment:    (NOTE) Pre diabetes:          5.7%-6.4%  Diabetes:              >6.4%  Glycemic control for   <7.0% adults with diabetes     CBG: Recent Labs  Lab 09/20/23 1544 09/20/23 1954 09/20/23 2339 09/21/23 0327 09/21/23 0747  GLUCAP 141* 137* 141* 154* 149*    Review of Systems:   Unable to assess pt mechanically intubated   Past Medical History:  He,  has a past medical history of COPD (chronic obstructive pulmonary disease) (HCC), DVT (deep venous thrombosis) (HCC), Dyspnea, GERD (gastroesophageal reflux disease), HLD (hyperlipidemia), migraines, Oxygen dependent, Pneumonia (2023), Pre-diabetes, Pulmonary nodule, Rotator cuff tear, right, Tobacco abuse, and Tobacco use.    Surgical History:   Past Surgical History:  Procedure Laterality Date   CHEST TUBE INSERTION     SHOULDER ARTHROSCOPY WITH SUBACROMIAL DECOMPRESSION, ROTATOR CUFF REPAIR AND BICEP TENDON REPAIR Right 02/18/2023   Procedure: RIGHT SHOULDER ARTHROSCOPY WITH DEBRIDEMENT, DECOMPRESSION, ROTATOR CUFF REPAIR, BICEPS TENODESIS.;  Surgeon: Christena Flake, MD;  Location: [facility] ORS;  Service: Orthopedics;  Laterality: Right;     Social History:   reports that he has been smoking cigarettes. He has a 12 pack-year smoking history. He has never used smokeless tobacco. He reports current alcohol use. He reports that he does not use drugs.   Family History:  His family history is not on file.   Allergies No Active Allergies   Home Medications  Prior to Admission medications   Medication Sig Start Date End Date Taking? Authorizing Provider  albuterol (PROVENTIL) (2.5 MG/3ML) 0.083% nebulizer solution USE 1 VIAL IN NEBULIZER EVERY 6 HOURS AS NEEDED FOR WHEEZING 06/28/21  Yes [provider]  albuterol (VENTOLIN HFA) 108 (90 Base) MCG/ACT inhaler Inhale 1 puff into the lungs every 4 (four) hours as needed for wheezing or shortness of breath.   Yes [provider]  APIXABAN Everlene Balls) VTE STARTER PACK (10MG  AND 5MG ) Take as directed on package: start with two-5mg  tablets twice daily for 7 days. On day 8, switch to one-5mg  tablet twice daily. 02/26/23  Yes Merwyn Katos, MD  ondansetron (ZOFRAN) 4 MG tablet Take 1 tablet (4 mg total) by mouth every 6 (six) hours as needed for nausea. 02/19/23  Yes Anson Oregon, PA-C  oxyCODONE (OXY IR/ROXICODONE) 5 MG immediate release tablet Take 1 tablet by mouth 2 (two) times daily as needed. 08/29/23  Yes [provider]  OXYGEN Inhale 3 L into the lungs continuous.   Yes [provider]  predniSONE (DELTASONE) 10 MG tablet Take 2-6 tablets by mouth daily with breakfast.  ORIGINAL VWU:[id number] 6 tabs daily x4 days; then 4 tabs daily  x4 days; then 2 tabs daily x4 days.   Yes [provider]  predniSONE (DELTASONE) 20 MG tablet Take 20 mg by mouth daily with breakfast.   Yes [provider]  TRELEGY ELLIPTA 100-62.5-25 MCG/ACT AEPB Inhale 1 puff into the lungs every morning. 07/04/21  Yes [provider]  amoxicillin-clavulanate (AUGMENTIN) 875-125 MG tablet Take 1 tablet by mouth every 12 (twelve) hours. Patient not taking: Reported on 09/10/2023    [provider]  benralizumab (FASENRA PEN) 30 MG/ML prefilled autoinjector Inject 30 mg into the skin as directed. Patient not taking: Reported on 09/10/2023 07/23/23   [provider]  nystatin (MYCOSTATIN) 100000 UNIT/ML suspension Take 5 mLs by mouth 4 (four) times daily. Patient not taking: Reported on 09/10/2023 09/04/23   [provider]  omeprazole (PRILOSEC) 20 MG capsule Take 20 mg by mouth daily. Patient not taking: Reported on 09/10/2023    [provider]  oxyCODONE (OXY IR/ROXICODONE) 5 MG immediate release tablet Take 1-2 tablets (5-10 mg total) by mouth every 4 (four) hours as needed for severe pain. 02/19/23   Anson Oregon, PA-C     Critical care time: 42 minutes      Zada Girt, [facility]  Pulmonary/Critical Care Pager [phone] (please enter 7 digits) PCCM Consult Pager [phone] (please enter 7 digits)

## 2023-09-22 DIAGNOSIS — J9621 Acute and chronic respiratory failure with hypoxia: Secondary | ICD-10-CM | POA: Diagnosis not present

## 2023-09-22 DIAGNOSIS — J441 Chronic obstructive pulmonary disease with (acute) exacerbation: Secondary | ICD-10-CM | POA: Diagnosis not present

## 2023-09-22 DIAGNOSIS — Z515 Encounter for palliative care: Secondary | ICD-10-CM | POA: Diagnosis not present

## 2023-09-22 DIAGNOSIS — Z720 Tobacco use: Secondary | ICD-10-CM | POA: Diagnosis not present

## 2023-09-22 DIAGNOSIS — J121 Respiratory syncytial virus pneumonia: Secondary | ICD-10-CM | POA: Diagnosis not present

## 2023-09-22 DIAGNOSIS — A419 Sepsis, unspecified organism: Secondary | ICD-10-CM

## 2023-09-22 DIAGNOSIS — J9622 Acute and chronic respiratory failure with hypercapnia: Secondary | ICD-10-CM | POA: Diagnosis not present

## 2023-09-22 LAB — CBC
HCT: 27.6 % — ABNORMAL LOW (ref 39.0–52.0)
Hemoglobin: 9 g/dL — ABNORMAL LOW (ref 13.0–17.0)
MCH: 30.9 pg (ref 26.0–34.0)
MCHC: 32.6 g/dL (ref 30.0–36.0)
MCV: 94.8 fL (ref 80.0–100.0)
Platelets: 265 10*3/uL (ref 150–400)
RBC: 2.91 MIL/uL — ABNORMAL LOW (ref 4.22–5.81)
RDW: 14.8 % (ref 11.5–15.5)
WBC: 8.6 10*3/uL (ref 4.0–10.5)
nRBC: 0.3 % — ABNORMAL HIGH (ref 0.0–0.2)

## 2023-09-22 LAB — RENAL FUNCTION PANEL
Albumin: 2.2 g/dL — ABNORMAL LOW (ref 3.5–5.0)
Anion gap: 7 (ref 5–15)
BUN: 26 mg/dL — ABNORMAL HIGH (ref 8–23)
CO2: 34 mmol/L — ABNORMAL HIGH (ref 22–32)
Calcium: 8.4 mg/dL — ABNORMAL LOW (ref 8.9–10.3)
Chloride: 95 mmol/L — ABNORMAL LOW (ref 98–111)
Creatinine, Ser: 0.45 mg/dL — ABNORMAL LOW (ref 0.61–1.24)
GFR, Estimated: >60 mL/min (ref >60)
Glucose, Bld: 142 mg/dL — ABNORMAL HIGH (ref 70–99)
Phosphorus: 2.6 mg/dL (ref 2.5–4.6)
Potassium: 4.1 mmol/L (ref 3.5–5.1)
Sodium: 136 mmol/L (ref 135–145)

## 2023-09-22 LAB — RESPIRATORY PANEL BY PCR

## 2023-09-22 LAB — GLUCOSE, CAPILLARY
Glucose-Capillary: 127 mg/dL — ABNORMAL HIGH (ref 70–99)
Glucose-Capillary: 137 mg/dL — ABNORMAL HIGH (ref 70–99)
Glucose-Capillary: 158 mg/dL — ABNORMAL HIGH (ref 70–99)
Glucose-Capillary: 170 mg/dL — ABNORMAL HIGH (ref 70–99)
Glucose-Capillary: 216 mg/dL — ABNORMAL HIGH (ref 70–99)
Glucose-Capillary: 221 mg/dL — ABNORMAL HIGH (ref 70–99)

## 2023-09-22 LAB — PROTIME-INR
INR: 1.1 (ref 0.8–1.2)
Prothrombin Time: 14.7 s (ref 11.4–15.2)

## 2023-09-22 LAB — MAGNESIUM: Magnesium: 2.1 mg/dL (ref 1.7–2.4)

## 2023-09-22 MED ORDER — VANCOMYCIN HCL IN DEXTROSE 750-5 MG/150ML-% IV SOLN
750.0000 mg | Freq: Two times a day (BID) | INTRAVENOUS | Status: DC
  Administered 2023-09-22: 750 mg via INTRAVENOUS
  Filled 2023-09-22 (×2): qty 150

## 2023-09-22 MED ORDER — IPRATROPIUM-ALBUTEROL 0.5-2.5 (3) MG/3ML IN SOLN
3.0000 mL | Freq: Four times a day (QID) | RESPIRATORY_TRACT | Status: DC
  Administered 2023-09-22 – 2023-09-26 (×18): 3 mL via RESPIRATORY_TRACT
  Filled 2023-09-22 (×18): qty 3

## 2023-09-22 MED ORDER — HYDROMORPHONE HCL 1 MG/ML IJ SOLN
1.0000 mg | Freq: Once | INTRAMUSCULAR | Status: AC
  Administered 2023-09-22: 1 mg via INTRAVENOUS
  Filled 2023-09-22 (×2): qty 1

## 2023-09-22 MED ORDER — HYDROMORPHONE HCL-NACL 50-0.9 MG/50ML-% IV SOLN
0.5000 mg/h | INTRAVENOUS | Status: DC
Start: 2023-09-22 — End: 2023-09-28
  Administered 2023-09-22 – 2023-09-23 (×2): 1 mg/h via INTRAVENOUS
  Administered 2023-09-24: 2.5 mg/h via INTRAVENOUS
  Administered 2023-09-24: 2 mg/h via INTRAVENOUS
  Administered 2023-09-25 – 2023-09-26 (×2): 2.5 mg/h via INTRAVENOUS
  Administered 2023-09-27: 2 mg/h via INTRAVENOUS
  Administered 2023-09-27: 2.5 mg/h via INTRAVENOUS
  Filled 2023-09-22 (×8): qty 50

## 2023-09-22 MED ORDER — HYDROMORPHONE BOLUS VIA INFUSION
0.2500 mg | INTRAVENOUS | Status: DC | PRN
  Administered 2023-09-23: 1 mg via INTRAVENOUS
  Administered 2023-09-23: 2 mg via INTRAVENOUS
  Administered 2023-09-23 (×2): 1 mg via INTRAVENOUS
  Administered 2023-09-23: 2 mg via INTRAVENOUS
  Administered 2023-09-23: 1 mg via INTRAVENOUS
  Administered 2023-09-23: 2 mg via INTRAVENOUS
  Administered 2023-09-23: 1 mg via INTRAVENOUS
  Administered 2023-09-24: 2 mg via INTRAVENOUS
  Administered 2023-09-24 (×2): 1.25 mg via INTRAVENOUS
  Administered 2023-09-24: 1 mg via INTRAVENOUS
  Administered 2023-09-24: 2 mg via INTRAVENOUS
  Administered 2023-09-25: 1.5 mg via INTRAVENOUS
  Administered 2023-09-28 – 2023-09-29 (×2): 2 mg via INTRAVENOUS

## 2023-09-22 MED ORDER — FUROSEMIDE 10 MG/ML IJ SOLN
60.0000 mg | Freq: Once | INTRAMUSCULAR | Status: AC
  Administered 2023-09-22: 60 mg via INTRAVENOUS
  Filled 2023-09-22: qty 6

## 2023-09-22 NOTE — Plan of Care (Signed)
  Problem: Education: Goal: Knowledge of General Education information will improve Description: Including pain rating scale, medication(s)/side effects and non-pharmacologic comfort measures Outcome: Not Progressing   Problem: Health Behavior/Discharge Planning: Goal: Ability to manage health-related needs will improve Outcome: Not Progressing   Problem: Clinical Measurements: Goal: Ability to maintain clinical measurements within normal limits will improve Outcome: Not Progressing Goal: Will remain free from infection Outcome: Not Progressing Goal: Diagnostic test results will improve Outcome: Not Progressing Goal: Respiratory complications will improve Outcome: Not Progressing Goal: Cardiovascular complication will be avoided Outcome: Not Progressing   Problem: Activity: Goal: Risk for activity intolerance will decrease Outcome: Not Progressing   Problem: Nutrition: Goal: Adequate nutrition will be maintained Outcome: Not Progressing   Problem: Coping: Goal: Level of anxiety will decrease Outcome: Not Progressing   Problem: Elimination: Goal: Will not experience complications related to bowel motility Outcome: Not Progressing Goal: Will not experience complications related to urinary retention Outcome: Not Progressing   Problem: Pain Management: Goal: General experience of comfort will improve Outcome: Not Progressing   Problem: Safety: Goal: Ability to remain free from injury will improve Outcome: Not Progressing   Problem: Skin Integrity: Goal: Risk for impaired skin integrity will decrease Outcome: Not Progressing   Problem: Education: Goal: Knowledge of disease or condition will improve Outcome: Not Progressing Goal: Knowledge of the prescribed therapeutic regimen will improve Outcome: Not Progressing   Problem: Activity: Goal: Ability to tolerate increased activity will improve Outcome: Not Progressing Goal: Will verbalize the importance of  balancing activity with adequate rest periods Outcome: Not Progressing   Problem: Respiratory: Goal: Ability to maintain a clear airway will improve Outcome: Not Progressing Goal: Levels of oxygenation will improve Outcome: Not Progressing Goal: Ability to maintain adequate ventilation will improve Outcome: Not Progressing   Problem: Education: Goal: Ability to describe self-care measures that may prevent or decrease complications (Diabetes Survival Skills Education) will improve Outcome: Not Progressing   Problem: Coping: Goal: Ability to adjust to condition or change in health will improve Outcome: Not Progressing   Problem: Fluid Volume: Goal: Ability to maintain a balanced intake and output will improve Outcome: Not Progressing   Problem: Health Behavior/Discharge Planning: Goal: Ability to identify and utilize available resources and services will improve Outcome: Not Progressing Goal: Ability to manage health-related needs will improve Outcome: Not Progressing   Problem: Metabolic: Goal: Ability to maintain appropriate glucose levels will improve Outcome: Not Progressing   Problem: Nutritional: Goal: Maintenance of adequate nutrition will improve Outcome: Not Progressing Goal: Progress toward achieving an optimal weight will improve Outcome: Not Progressing   Problem: Skin Integrity: Goal: Risk for impaired skin integrity will decrease Outcome: Not Progressing   Problem: Tissue Perfusion: Goal: Adequacy of tissue perfusion will improve Outcome: Not Progressing   Problem: Activity: Goal: Ability to tolerate increased activity will improve Outcome: Not Progressing   Problem: Respiratory: Goal: Ability to maintain a clear airway and adequate ventilation will improve Outcome: Not Progressing   Problem: Role Relationship: Goal: Method of communication will improve Outcome: Not Progressing

## 2023-09-22 NOTE — Progress Notes (Signed)
NAME:  Benjamin Bolton, MRN:  [id number], DOB:  [birth year], LOS: 12 ADMISSION DATE:  09/10/2023, CONSULTATION DATE: 09/13/2023  Brief Pt Description / Synopsis:  63 y.o. 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   History of Present Illness:  Benjamin Bolton is a 63 year old male with a history of severe COPD followed by [facility] Pulmonology who presents to the hospital from [facility] clinic on 1/8 with increased shortness of breath. He was admitted to the medicine service for management of COPD exacerbation and found to have an RSV infection.   Patient was admitted to [facility] and initiated on nebulizers, steroids, and antibiotics. He was managed medically for COPD exacerbation and UTI but developed worsening respiratory distress this AM requiring the initiation of BiPAP. Rapid response was called and he was transferred to the ICU. Initial blood gas showed pH of 7.2, with repeat VBG showing he remained acidemic at 7.18. Given altered mental status and respiratory failure, decision made at the bedside to proceed with emergent intubation.  Pertinent  Medical History  DVT on Eliquis COPD on 2L O2 Type II Diabetes Mellitus  GERD HLD Pulmonary Nodule Tobacco Abuse   Micro Data:  1/8: SARS-CoV-2 PCR>>negative 1/8: Blood culture x2>> no growth 1/8: Urine>> multiple species (suggest recollection) 1/9: RVP>> + RSV 1/9: Sputum>> normal respiratory flora 1/9: HIV Screen>> non reactive 1/11: MRSA PCR>> negative 1/19: MRSA PCR>> 1/19: RVP>>  Antimicrobials:   Anti-infectives (From admission, onward)    Start     Dose/Rate Route Frequency Ordered Stop   09/22/23 0010  vancomycin (VANCOCIN) IVPB 750 mg/150 ml premix        750 mg 150 mL/hr over 60 Minutes Intravenous Every 12 hours 09/22/23 0004      09/22/23 0000  Vancomycin (VANCOCIN) 750 mg IVPB  Status:  Discontinued        750 mg 150 mL/hr over 60 Minutes Intravenous Every 12 hours 09/21/23 1120 09/21/23 2356   09/22/23 0000  vancomycin (VANCOREADY) IVPB 750 mg/150 mL  Status:  Discontinued        750 mg 150 mL/hr over 60 Minutes Intravenous Every 12 hours 09/21/23 2356 09/22/23 0004   09/21/23 1300  vancomycin (VANCOCIN) IVPB 1000 mg/200 mL premix        1,000 mg 200 mL/hr over 60 Minutes Intravenous  Once 09/21/23 1120 09/21/23 1900   09/21/23 1215  piperacillin-tazobactam (ZOSYN) IVPB 3.375 g        3.375 g 12.5 mL/hr over 240 Minutes Intravenous Every 8 hours 09/21/23 1120     09/21/23 1130  vancomycin (VANCOREADY) IVPB 1250 mg/250 mL  Status:  Discontinued        1,250 mg 166.7 mL/hr over 90 Minutes Intravenous  Once 09/21/23 1046 09/21/23 1120   09/21/23 1100  piperacillin-tazobactam (ZOSYN) IVPB 3.375 g  Status:  Discontinued        3.375 g 12.5 mL/hr over 240 Minutes Intravenous  Once 09/21/23 1046 09/21/23 1152   09/13/23 1445  piperacillin-tazobactam (ZOSYN) IVPB 3.375 g        3.375 g 12.5 mL/hr over 240 Minutes Intravenous Every 8 hours 09/13/23 1351 09/17/23 0157   09/10/23 2315  cefTRIAXone (ROCEPHIN) 1 g in sodium chloride 0.9 % 100 mL IVPB  Status:  Discontinued        1 g  200 mL/hr over 30 Minutes Intravenous Every 24 hours 09/10/23 2305 09/13/23 1336      Significant Hospital Events: Including procedures, antibiotic start and stop dates in addition to other pertinent events   1/08: admit to [facility] 1/11: rapid response, transfer to ICU, intubated 1/12: remains vented, sedated, does not follow commands.  Failed SBT  1/13: Overnight due to concerns of aspiration TF's held.  Will repeat SBT today 1/14: Overnight pt had possible seizure activity with rhythmic tremors noted in the mouth/lower face/neck during episode pt unresponsive to pain.  Received 2 mg of versed and 2g keppra bolus with resolution of  symptoms.  CT Head negative.  Standing dose of keppra 1g bid.  Neurology consulted.    1/15: On minimal vent settings, unable to tolerate WUA due to increased WOB. Shifting measures given for Hyperkalemia of 5.5 with peaked T waves.  Abdomen distended, KUB without evidence of obstruction, give Lactulose.  Diurese with 40 mg IV Lasix x1. 1/16: Failed WUA, with increased WOB and hypoxic with O2 sats dropping to mid 70's.  Add Seroquel.  Palliative Care consulted to assist with GOC. 1/17: Precedex started for WUA.  Failed SBT due to increased WOB, tachypnea, and hypoxia.  Increase Seroquel to 50 mg BID. Transition Heparin to Elqiuis 1/18: Pt remains mechanically intubated and has failed multiple SBT's.  Planning for family meeting today at bedside with palliative care and PCCM will perform SBT once family arrives at bedside.  Remains sedated with propofol and fentanyl gtts.  Family meeting held code status changed to DNR per family request.  Pts family trying to decide if they want to proceed with tracheostomy.  Required additional sedation and prn vecuronium overnight due to vent dyssynchrony  1/19: Pt febrile overnight with increased vasopressor requirements levophed gtt @12  mcg/min and febrile tmax 101 degrees F concerning for recurrent sepsis.  Broad spectrum abx started. Pt remains mechanically intubated and unable to successfully liberate from ventilator  1/20 remains on vent, severe rap failure  Interim History / Subjective:  Remains on vent Severe end stage COPD Failure to wean from vent Severe resp failure Remains critically ill   Objective   Blood pressure 112/78, pulse 69, temperature 97.8 F (36.6 C), temperature source Axillary, resp. rate 13, height 6' (1.829 m), weight 61.1 kg, SpO2 97%.    Vent Mode: PRVC FiO2 (%):  [30 %] 30 % Set Rate:  [12 bmp] 12 bmp Vt Set:  [570 mL] 570 mL PEEP:  [5 cmH20-8 cmH20] 8 cmH20 Plateau Pressure:  [8 cmH20-22 cmH20] 8 cmH20   Intake/Output  Summary (Last 24 hours) at 09/22/2023 3086 Last data filed at 09/22/2023 0345 Gross per 24 hour  Intake 3298.49 ml  Output 1150 ml  Net 2148.49 ml   Filed Weights   09/20/23 0400 09/21/23 0400 09/22/23 0400  Weight: 56.6 kg 57.2 kg 61.1 kg       REVIEW OF SYSTEMS  PATIENT IS UNABLE TO PROVIDE COMPLETE REVIEW OF SYSTEMS DUE TO SEVERE CRITICAL ILLNESS   PHYSICAL EXAMINATION:  GENERAL:critically ill appearing, +resp distress EYES: Pupils equal, round, reactive to light.  No scleral icterus.  MOUTH: Moist mucosal membrane. INTUBATED NECK: Supple.  PULMONARY: Lungs clear to auscultation, +rhonchi, +wheezing CARDIOVASCULAR: S1 and S2.  Regular rate and rhythm GASTROINTESTINAL: Soft, nontender, -distended. Positive bowel sounds.  MUSCULOSKELETAL: No swelling, clubbing, or edema.  NEUROLOGIC: obtunded,sedated SKIN:normal, warm to touch, Capillary refill delayed  Pulses present bilaterally    Assessment & Plan:  63 yo AAM  with Acute  metabolic encephalopathy due to severe hypoxic and hypercapnic resp failure due to severe COPD exacerbation with sepsis and RSV pneumonia complicated by demand ischemia  Severe ACUTE Hypoxic and Hypercapnic Respiratory Failure -continue Mechanical Ventilator support -Wean Fio2 and PEEP as tolerated -VAP/VENT bundle implementation - Wean PEEP & FiO2 as tolerated, maintain SpO2 > 88% - Head of bed elevated 30 degrees, VAP protocol in place - Plateau pressures less than 30 cm H20  - Intermittent chest x-ray & ABG PRN - Ensure adequate pulmonary hygiene  - Family trying to decide if they would like to proceed with tracheostomy placement   SEVERE COPD EXACERBATION -continue IV steroids as prescribed -continue NEB THERAPY as prescribed -morphine as needed -wean fio2 as needed and tolerated  INFECTIOUS DISEASE RSV pneumonia -continue antibiotics as prescribed -follow up cultures   NEUROLOGY ACUTE METABOLIC ENCEPHALOPATHY -need for  sedation -Goal RASS -2 to -3 Mechanically intubation pain/discomfort  Possible seizure activity~very low suspicion   - Correct metabolic derangements  - Avoid sedating medications as able  - Maintain RASS goal 0 to -1 - PAD protocol to maintain RASS goal: propofol gtt and prn fentanyl/versed; will restart precedex gtt and stop propofol during SBT  - Scheduled valium for anxiety/delirium  - WUA daily - Continue thiamine and mvi  - Keppra was discontinued - Seizure precautions  - Continue seroquel to 50 mg BID to assist with WUA  CARDIAC Mildly elevated troponin secondary to demand ischemia  Hypotension suspect secondary to possible sepsis and sedating medication  Hx: HLD and DVT on Eliquis Echo 09/14/23: 50 to 55%, indeterminate diastolic parameters - Continuous telemetry monitoring  - Troponin peaked at 115 - IV fluid resuscitation and levophed gtt to maintain map 65 or higher  - Continue apixaban for now  - Cardiology signed off no further cardiac evaluations indicated at this time   ACUTE ANEMIA- TRANSFUSE AS NEEDED CONSIDER TRANSFUSION  IF HGB<7   ENDO - ICU hypoglycemic\Hyperglycemia protocol -check FSBS per protocol   GI GI PROPHYLAXIS as indicated  NUTRITIONAL STATUS DIET-->TF's as tolerated Constipation protocol as indicated   ELECTROLYTES -follow labs as needed -replace as needed -pharmacy consultation and following      Pt is critically ill with AECOPD in the setting of RSV and pneumonia.  Anticipate difficulty in liberating from mechanical ventilation.  May end up needing Tracheostomy.  Scheduled meeting on Monday 09/22/23 at 1300  Best Practice (right click and "Reselect all SmartList Selections" daily)   Diet/type: TF's DVT prophylaxis Apixaban  Pressure ulcer(s): N/A GI prophylaxis: Pepcid  Lines: RUE PICC Line and still needed  Foley:  External Catheter  Code Status: DNR Last date of multidisciplinary goals of care discussion  [09/21/2023]  01/19: Family meeting scheduled on Monday 09/22/23 at 1300 to discuss tracheostomy placement  Labs   CBC: Recent Labs  Lab 09/15/23 0833 09/16/23 0225 09/17/23 0412 09/18/23 0155 09/19/23 0556 09/20/23 0406 09/21/23 0333 09/22/23 0400  WBC 13.3* 12.6* 11.6* 10.3 7.5 9.4 9.1 8.6  NEUTROABS 11.0* 11.2* 9.7*  --   --   --   --   --   HGB 10.8* 11.3* 10.5* 9.5* 9.3* 9.8* 9.6* 9.0*  HCT 31.8* 34.2* 32.2* 29.7* 29.6* 30.6* 30.0* 27.6*  MCV 93.5 92.9 95.3 95.5 96.7 93.9 97.7 94.8  PLT 161 170 174 194 208 241 309 265    Basic Metabolic Panel: Recent Labs  Lab 09/16/23 0225 09/17/23 0412 09/17/23 1529 09/18/23 0155 09/18/23 0446 09/19/23 6295 09/20/23 0406 09/21/23 2841  09/22/23 0400  NA 130* 133*   < >  --  130* 133* 133* 133* 136  K 4.9 5.5*   < >  --  4.8 4.8 4.1 4.1 4.1  CL 92* 90*   < >  --  87* 89* 92* 91* 95*  CO2 27 31   < >  --  35* 34* 33* 32 34*  GLUCOSE 267* 119*   < >  --  120* 107* 183* 169* 142*  BUN 21 25*   < >  --  25* 23 26* 30* 26*  CREATININE 0.82 0.73   < >  --  0.52* 0.62 0.53* 0.51* 0.45*  CALCIUM 8.7* 8.8*   < >  --  9.0 8.8* 8.8* 8.4* 8.4*  MG 2.5* 2.5*  --  2.4  --  2.2  --   --  2.1  PHOS 2.4* 2.8  --   --  2.9 3.0 3.0 3.2 2.6   < > = values in this interval not displayed.   GFR: Estimated Creatinine Clearance: 83.8 mL/min (A) (by C-G formula based on SCr of 0.45 mg/dL (L)). Recent Labs  Lab 09/19/23 0556 09/20/23 0406 09/21/23 0333 09/22/23 0400  PROCALCITON  --   --  1.55  --   WBC 7.5 9.4 9.1 8.6  LATICACIDVEN  --   --  1.0  --     Liver Function Tests: Recent Labs  Lab 09/18/23 0446 09/19/23 0556 09/20/23 0406 09/21/23 0333 09/22/23 0400  ALBUMIN 2.6* 2.4* 2.5* 2.5* 2.2*   No results for input(s): "LIPASE", "AMYLASE" in the last 168 hours. No results for input(s): "AMMONIA" in the last 168 hours.  ABG    Component Value Date/Time   PHART 7.43 09/20/2023 2332   PCO2ART 62 (H) 09/20/2023 2332   PO2ART 53  (L) 09/20/2023 2332   HCO3 41.2 (H) 09/20/2023 2332   O2SAT 86.6 09/20/2023 2332     Coagulation Profile: No results for input(s): "INR", "PROTIME" in the last 168 hours.   Cardiac Enzymes: No results for input(s): "CKTOTAL", "CKMB", "CKMBINDEX", "TROPONINI" in the last 168 hours.  HbA1C: Hgb A1c MFr Bld  Date/Time Value Ref Range Status  09/15/2023 09:24 PM 6.4 (H) 4.8 - 5.6 % Final    Comment:    (NOTE) Pre diabetes:          5.7%-6.4%  Diabetes:              >6.4%  Glycemic control for   <7.0% adults with diabetes     CBG: Recent Labs  Lab 09/21/23 1610 09/21/23 1946 09/21/23 2341 09/22/23 0356 09/22/23 0742  GLUCAP 173* 109* 126* 127* 158*    Review of Systems:   Unable to assess pt mechanically intubated   Past Medical History:  He,  has a past medical history of COPD (chronic obstructive pulmonary disease) (HCC), DVT (deep venous thrombosis) (HCC), Dyspnea, GERD (gastroesophageal reflux disease), HLD (hyperlipidemia), migraines, Oxygen dependent, Pneumonia (2023), Pre-diabetes, Pulmonary nodule, Rotator cuff tear, right, Tobacco abuse, and Tobacco use.   Surgical History:   Past Surgical History:  Procedure Laterality Date   CHEST TUBE INSERTION     SHOULDER ARTHROSCOPY WITH SUBACROMIAL DECOMPRESSION, ROTATOR CUFF REPAIR AND BICEP TENDON REPAIR Right 02/18/2023   Procedure: RIGHT SHOULDER ARTHROSCOPY WITH DEBRIDEMENT, DECOMPRESSION, ROTATOR CUFF REPAIR, BICEPS TENODESIS.;  Surgeon: Christena Flake, MD;  Location: [facility] ORS;  Service: Orthopedics;  Laterality: Right;     Social History:   reports that he has been smoking  cigarettes. He has a 12 pack-year smoking history. He has never used smokeless tobacco. He reports current alcohol use. He reports that he does not use drugs.   Family History:  His family history is not on file.   Allergies No Active Allergies   Home Medications  Prior to Admission medications   Medication Sig Start Date End Date  Taking? Authorizing Provider  albuterol (PROVENTIL) (2.5 MG/3ML) 0.083% nebulizer solution USE 1 VIAL IN NEBULIZER EVERY 6 HOURS AS NEEDED FOR WHEEZING 06/28/21  Yes [provider]  albuterol (VENTOLIN HFA) 108 (90 Base) MCG/ACT inhaler Inhale 1 puff into the lungs every 4 (four) hours as needed for wheezing or shortness of breath.   Yes [provider]  APIXABAN Everlene Balls) VTE STARTER PACK (10MG  AND 5MG ) Take as directed on package: start with two-5mg  tablets twice daily for 7 days. On day 8, switch to one-5mg  tablet twice daily. 02/26/23  Yes Merwyn Katos, MD  ondansetron (ZOFRAN) 4 MG tablet Take 1 tablet (4 mg total) by mouth every 6 (six) hours as needed for nausea. 02/19/23  Yes Anson Oregon, PA-C  oxyCODONE (OXY IR/ROXICODONE) 5 MG immediate release tablet Take 1 tablet by mouth 2 (two) times daily as needed. 08/29/23  Yes [provider]  OXYGEN Inhale 3 L into the lungs continuous.   Yes [provider]  predniSONE (DELTASONE) 10 MG tablet Take 2-6 tablets by mouth daily with breakfast.  ORIGINAL UVO:ZDGU 6 tabs daily x4 days; then 4 tabs daily x4 days; then 2 tabs daily x4 days.   Yes [provider]  predniSONE (DELTASONE) 20 MG tablet Take 20 mg by mouth daily with breakfast.   Yes [provider]  TRELEGY ELLIPTA 100-62.5-25 MCG/ACT AEPB Inhale 1 puff into the lungs every morning. 07/04/21  Yes [provider]  amoxicillin-clavulanate (AUGMENTIN) 875-125 MG tablet Take 1 tablet by mouth every 12 (twelve) hours. Patient not taking: Reported on 09/10/2023    [provider]  benralizumab (FASENRA PEN) 30 MG/ML prefilled autoinjector Inject 30 mg into the skin as directed. Patient not taking: Reported on 09/10/2023 07/23/23   [provider]  nystatin (MYCOSTATIN) 100000 UNIT/ML suspension Take 5 mLs by mouth 4 (four) times daily. Patient not taking: Reported on 09/10/2023 09/04/23   [provider]   omeprazole (PRILOSEC) 20 MG capsule Take 20 mg by mouth daily. Patient not taking: Reported on 09/10/2023    [provider]  oxyCODONE (OXY IR/ROXICODONE) 5 MG immediate release tablet Take 1-2 tablets (5-10 mg total) by mouth every 4 (four) hours as needed for severe pain. 02/19/23   Anson Oregon, PA-C      DVT/GI PRX  assessed I Assessed the need for Labs I Assessed the need for Foley I Assessed the need for Central Venous Line Family Discussion when available I Assessed the need for Mobilization I made an Assessment of medications to be adjusted accordingly Safety Risk assessment completed  CASE DISCUSSED IN MULTIDISCIPLINARY ROUNDS WITH ICU TEAM     Critical Care Time devoted to patient care services described in this note is 60 minutes.  Critical care was necessary to treat /prevent imminent and life-threatening deterioration. Overall, patient is critically ill, prognosis is guarded.  Patient with Multiorgan failure and at high risk for cardiac arrest and death.    Lucie Leather, M.D.  [facility]  Medical Director [facility] Medical Director [facility] Cardio-Pulmonary Department

## 2023-09-22 NOTE — IPAL (Signed)
  Interdisciplinary Goals of Care Family Meeting   Date carried out: 09/22/2023  Location of the meeting: Conference room  Member's involved: Physician, Nurse Practitioner, Family Member or next of kin, and Palliative care team member      GOALS OF CARE DISCUSSION  The Clinical status was relayed to family in detail- Wife, Son, sisters and brother of patient  Updated and notified of patients medical condition- Patient remains unresponsive and will not open eyes to command.   Patient is having a weak cough and struggling to remove secretions.   Patient with increased WOB and using accessory muscles to breathe Explained to family course of therapy and the modalities   Patient with Progressive multiorgan failure with a very high probablity of a very minimal chance of meaningful recovery despite all aggressive and optimal medical therapy.  PATIENT REMAINS DNR status  Patient would want agressive medical care with Trach and feeding tube Will consult ENT for trach and IR for Peg TUBE  Family understands the situation.  Family are satisfied with Plan of action and management. All questions answered  Additional CC time 32 mins   Henery Betzold Santiago Glad, M.D.  [facility]  Medical Director [facility] Medical Director [facility] Cardio-Pulmonary Department

## 2023-09-22 NOTE — Progress Notes (Addendum)
Palliative Care Progress Note, Assessment & Plan   Patient Name: Benjamin Bolton       Date: 09/22/2023 DOB: [birth year]  Age: 63 y.o. MRN#: [id number] Attending Physician: Erin Fulling, MD Primary Care Physician: [facility] Admit Date: 09/10/2023  Subjective: Patient is lying in bed, intubated and sedated.  Patient is unable to participate in goals of care medical decision making at this time.  Thus, his family has gathered in the conference room for goals of care discussion with CCM.  HPI: 63 y.o. male  with past medical history of 2 diabetes, GERD, HLD, tobacco use disorder, COPD stage IV (2-3L [employer] at home), DVT (Eliquis) admitted on 09/10/2023 with acute on chronic hypoxic and hypercapnic respiratory failure in the setting of AECOPD with RSV pneumonia.   1/11, rapid response, transferred to ICU, patient was intubated and sedated..  1/12, remains vented, sedated, does not follow commands.  Failed SBT  1/13, overnight due to concerns of aspiration TF's held.  Will repeat SBT today 1/14, overnight pt had possible seizure activity with rhythmic tremors noted in the mouth/lower face/neck during episode pt unresponsive to pain.  Received 2 mg of versed and 2g keppra bolus with resolution of symptoms.  CT Head negative.  Standing dose of keppra 1g bid.  Neurology consulted.    1/15, on minimal vent settings, unable to tolerate WUA due to increased WOB. Shifting measures given for Hyperkalemia of 5.5 with peaked T waves.  Abdomen distended, KUB without evidence of obstruction, give Lactulose.  Diurese with 40 mg IV Lasix x1. 1/16, Failed WUA, with increased WOB and hypoxic with O2 sats dropping to mid 70's.  Add Seroquel.   1/17-1/19 failed WUA/SBT 1/19 concern for recurrent sepsis due to fevers developing  overnight and increased vasopressor support   Palliative Care consulted to assist with GOC.  Summary of counseling/coordination of care: Extensive chart review completed prior to meeting patient including labs, vital signs, imaging, progress notes, orders, and available advanced directive documents from current and previous encounters.   After reviewing the patient's chart and assessing the patient at bedside, I met with the patient's wife/decision maker, son, and sisters with Dr. Belia Heman and NP Elvina Sidle in consult room just outside of ICU.    Medical update given.  Discussed patient's overall prognosis.  Reviewed the need to remove mechanical ventilatory support as it causes damage to mouth and throat given prolonged time of intubation.  Discussed importance of keeping patient's wishes at center of all medical decision making.    Dr. Jeralene Huff outlined patient's current medical status, severe COPD, RSV, pneumonia, previous hospitalizations, and prognosis.  Pros and cons of tracheostomy and PEG tube placement reviewed.  Discussion of long-term recovery was had.  Space and opportunity provided for family to ask questions and voiced their concerns.  Therapeutic silence, active listening, and emotional support provided.    After extensive discussion, patient's wife, son, and family present have decided to move forward with trach and PEG placement.  CCM to place orders.  CCM appreciative of PMT support and notified this NP that PMT services are not needed at this time. He advised that PMT can sign off.   PMT will step back from daily  visits and monitor the patient peripherally.   Please reengage with PMT if goals change, at patient/family's request, or if patient's health deteriorates during hospitalization.  Physical Exam Vitals reviewed.  Constitutional:      Appearance: He is ill-appearing.  HENT:     Head: Normocephalic.  Cardiovascular:     Rate and Rhythm: Normal rate.  Pulmonary:      Comments: Mechanical vent Chest:     Chest wall: No mass.  Musculoskeletal:     Comments: Does not MAETC  Skin:    General: Skin is warm and dry.             Total Time 50 minutes   Time spent includes: Detailed review of medical records (labs, imaging, vital signs), medically appropriate exam (mental status, respiratory, cardiac, skin), discussed with treatment team, counseling and educating patient, family and staff, documenting clinical information, medication management and coordination of care.  Samara Deist L. Bonita Quin, DNP, FNP-BC Palliative Medicine Team

## 2023-09-23 DIAGNOSIS — J9622 Acute and chronic respiratory failure with hypercapnia: Secondary | ICD-10-CM | POA: Diagnosis not present

## 2023-09-23 DIAGNOSIS — J441 Chronic obstructive pulmonary disease with (acute) exacerbation: Secondary | ICD-10-CM | POA: Diagnosis not present

## 2023-09-23 DIAGNOSIS — J121 Respiratory syncytial virus pneumonia: Secondary | ICD-10-CM | POA: Diagnosis not present

## 2023-09-23 DIAGNOSIS — J9621 Acute and chronic respiratory failure with hypoxia: Secondary | ICD-10-CM | POA: Diagnosis not present

## 2023-09-23 LAB — CBC
HCT: 29.3 % — ABNORMAL LOW (ref 39.0–52.0)
Hemoglobin: 9.1 g/dL — ABNORMAL LOW (ref 13.0–17.0)
MCH: 30.4 pg (ref 26.0–34.0)
MCHC: 31.1 g/dL (ref 30.0–36.0)
MCV: 98 fL (ref 80.0–100.0)
Platelets: 294 10*3/uL (ref 150–400)
RBC: 2.99 MIL/uL — ABNORMAL LOW (ref 4.22–5.81)
RDW: 14.9 % (ref 11.5–15.5)
WBC: 8.5 10*3/uL (ref 4.0–10.5)
nRBC: 0.6 % — ABNORMAL HIGH (ref 0.0–0.2)

## 2023-09-23 LAB — CULTURE, RESPIRATORY W GRAM STAIN: Culture: NORMAL

## 2023-09-23 LAB — GLUCOSE, CAPILLARY
Glucose-Capillary: 105 mg/dL — ABNORMAL HIGH (ref 70–99)
Glucose-Capillary: 149 mg/dL — ABNORMAL HIGH (ref 70–99)
Glucose-Capillary: 160 mg/dL — ABNORMAL HIGH (ref 70–99)
Glucose-Capillary: 191 mg/dL — ABNORMAL HIGH (ref 70–99)
Glucose-Capillary: 202 mg/dL — ABNORMAL HIGH (ref 70–99)
Glucose-Capillary: 241 mg/dL — ABNORMAL HIGH (ref 70–99)

## 2023-09-23 LAB — BASIC METABOLIC PANEL
Anion gap: 10 (ref 5–15)
BUN: 31 mg/dL — ABNORMAL HIGH (ref 8–23)
CO2: 36 mmol/L — ABNORMAL HIGH (ref 22–32)
Calcium: 8.6 mg/dL — ABNORMAL LOW (ref 8.9–10.3)
Chloride: 92 mmol/L — ABNORMAL LOW (ref 98–111)
Creatinine, Ser: 0.6 mg/dL — ABNORMAL LOW (ref 0.61–1.24)
GFR, Estimated: >60 mL/min (ref >60)
Glucose, Bld: 152 mg/dL — ABNORMAL HIGH (ref 70–99)
Potassium: 3.8 mmol/L (ref 3.5–5.1)
Sodium: 138 mmol/L (ref 135–145)

## 2023-09-23 MED ORDER — VECURONIUM BROMIDE 10 MG IV SOLR
10.0000 mg | INTRAVENOUS | Status: DC | PRN
  Administered 2023-09-23 – 2023-09-24 (×2): 10 mg via INTRAVENOUS
  Filled 2023-09-23 (×2): qty 10

## 2023-09-23 MED ORDER — CEFAZOLIN SODIUM-DEXTROSE 2-4 GM/100ML-% IV SOLN
2.0000 g | Freq: Once | INTRAVENOUS | Status: AC
  Administered 2023-09-24: 2 g via INTRAVENOUS
  Filled 2023-09-23: qty 100

## 2023-09-23 MED ORDER — MIDAZOLAM HCL 2 MG/2ML IJ SOLN: 4.0000 mg | INTRAMUSCULAR | Status: DC | PRN

## 2023-09-23 NOTE — Plan of Care (Signed)
?  Problem: Clinical Measurements: ?Goal: Respiratory complications will improve ?Outcome: Not Progressing ?  ?Problem: Activity: ?Goal: Risk for activity intolerance will decrease ?Outcome: Not Progressing ?  ?Problem: Coping: ?Goal: Level of anxiety will decrease ?Outcome: Not Progressing ?  ?Problem: Skin Integrity: ?Goal: Risk for impaired skin integrity will decrease ?Outcome: Not Progressing ?  ?

## 2023-09-23 NOTE — Consult Note (Signed)
Chief Complaint: Acute on Chronic Respiratory Failure. Need for long term nutritional access. Request is for gastrostomy tube placement   Referring Physician(s): Dr. Erin Fulling  Supervising Physician: Ruel Favors  Patient Status: [facility] - In-pt  History of Present Illness: Benjamin Bolton is a 63 y.o. male inpatient. Smoker. History of DM, GERD, HLD, COPD On home O2 2-3L), DVT (on Eliquis). Admitted on 09/10/2023 with acute on chronic hypoxic and hypercapnic respiratory failure in the setting of AECOPD with RSV pneumonia. Patient now in ICU intubated. Team is requesting a gastrostomy tube placement for long term nutritional access.   Unable to assess ROS due to patient condition.   BUN 31, Hgb 9.1. No allergies listed. Patient on levophed. Last dose of eliquis on 1.20.25. CT Chest from 8.23.24 shows stomach in accessible position for percutaneous gastrostomy tube placement.   Return precautions and treatment recommendations and follow-up discussed with the patient's wife who is agreeable with the plan.    Past Medical History:  Diagnosis Date   COPD (chronic obstructive pulmonary disease) (HCC)    DVT (deep venous thrombosis) (HCC)    Dyspnea    GERD (gastroesophageal reflux disease)    no meds   HLD (hyperlipidemia)    Hx of migraines    Oxygen dependent    3 L [place]   Pneumonia 2023   Pre-diabetes    Pulmonary nodule    Rotator cuff tear, right    Tobacco abuse    Tobacco use     Past Surgical History:  Procedure Laterality Date   CHEST TUBE INSERTION     SHOULDER ARTHROSCOPY WITH SUBACROMIAL DECOMPRESSION, ROTATOR CUFF REPAIR AND BICEP TENDON REPAIR Right 02/18/2023   Procedure: RIGHT SHOULDER ARTHROSCOPY WITH DEBRIDEMENT, DECOMPRESSION, ROTATOR CUFF REPAIR, BICEPS TENODESIS.;  Surgeon: Christena Flake, MD;  Location: [facility] ORS;  Service: Orthopedics;  Laterality: Right;    Allergies: Patient has no active allergies.  Medications: Prior to Admission medications    Medication Sig Start Date End Date Taking? Authorizing Provider  albuterol (PROVENTIL) (2.5 MG/3ML) 0.083% nebulizer solution USE 1 VIAL IN NEBULIZER EVERY 6 HOURS AS NEEDED FOR WHEEZING 06/28/21  Yes [provider]  albuterol (VENTOLIN HFA) 108 (90 Base) MCG/ACT inhaler Inhale 1 puff into the lungs every 4 (four) hours as needed for wheezing or shortness of breath.   Yes [provider]  APIXABAN Everlene Balls) VTE STARTER PACK (10MG  AND 5MG ) Take as directed on package: start with two-5mg  tablets twice daily for 7 days. On day 8, switch to one-5mg  tablet twice daily. 02/26/23  Yes Merwyn Katos, MD  ondansetron (ZOFRAN) 4 MG tablet Take 1 tablet (4 mg total) by mouth every 6 (six) hours as needed for nausea. 02/19/23  Yes Anson Oregon, PA-C  oxyCODONE (OXY IR/ROXICODONE) 5 MG immediate release tablet Take 1 tablet by mouth 2 (two) times daily as needed. 08/29/23  Yes [provider]  OXYGEN Inhale 3 L into the lungs continuous.   Yes [provider]  predniSONE (DELTASONE) 10 MG tablet Take 2-6 tablets by mouth daily with breakfast.  ORIGINAL ONG:EXBM 6 tabs daily x4 days; then 4 tabs daily x4 days; then 2 tabs daily x4 days.   Yes [provider]  predniSONE (DELTASONE) 20 MG tablet Take 20 mg by mouth daily with breakfast.   Yes [provider]  TRELEGY ELLIPTA 100-62.5-25 MCG/ACT AEPB Inhale 1 puff into the lungs every morning. 07/04/21  Yes [provider]  amoxicillin-clavulanate (AUGMENTIN) 875-125 MG tablet  Take 1 tablet by mouth every 12 (twelve) hours. Patient not taking: Reported on 09/10/2023    [provider]  benralizumab (FASENRA PEN) 30 MG/ML prefilled autoinjector Inject 30 mg into the skin as directed. Patient not taking: Reported on 09/10/2023 07/23/23   [provider]  nystatin (MYCOSTATIN) 100000 UNIT/ML suspension Take 5 mLs by mouth 4 (four) times daily. Patient not taking: Reported on 09/10/2023  09/04/23   [provider]  omeprazole (PRILOSEC) 20 MG capsule Take 20 mg by mouth daily. Patient not taking: Reported on 09/10/2023    [provider]  oxyCODONE (OXY IR/ROXICODONE) 5 MG immediate release tablet Take 1-2 tablets (5-10 mg total) by mouth every 4 (four) hours as needed for severe pain. 02/19/23   Anson Oregon, PA-C     No family history on file.  Social History   Socioeconomic History   Marital status: Single    Spouse name: Not on file   Number of children: Not on file   Years of education: Not on file   Highest education level: Not on file  Occupational History   Not on file  Tobacco Use   Smoking status: Some Days    Current packs/day: 0.25    Average packs/day: 0.3 packs/day for 48.0 years (12.0 ttl pk-yrs)    Types: Cigarettes   Smokeless tobacco: Never  Vaping Use   Vaping status: Never Used  Substance and Sexual Activity   Alcohol use: Yes    Comment: occ   Drug use: Never   Sexual activity: Not on file  Other Topics Concern   Not on file  Social History Narrative   Not on file   Social Drivers of Health   Financial Resource Strain: Low Risk  (08/25/2023)   Received from [facility]   Overall Financial Resource Strain (CARDIA)    Difficulty of Paying Living Expenses: Not very hard  Recent Concern: Financial Resource Strain - Medium Risk (07/23/2023)   Received from [facility]   Overall Financial Resource Strain (CARDIA)    Difficulty of Paying Living Expenses: Somewhat hard  Food Insecurity: No Food Insecurity (09/12/2023)   Hunger Vital Sign    Worried About Running Out of Food in the Last Year: Never true    Ran Out of Food in the Last Year: Never true  Recent Concern: Food Insecurity - Food Insecurity Present (08/25/2023)   Received from [facility]   Hunger Vital Sign    Worried About Running Out of Food in the Last Year: Often true    Ran Out of Food in the Last  Year: Often true  Transportation Needs: No Transportation Needs (09/12/2023)   PRAPARE - Administrator, Civil Service (Medical): No    Lack of Transportation (Non-Medical): No  Recent Concern: Transportation Needs - Unmet Transportation Needs (08/25/2023)   Received from [facility] - Transportation    In the past 12 months, has lack of transportation kept you from medical appointments or from getting medications?: Yes    Lack of Transportation (Non-Medical): No  Physical Activity: Insufficiently Active (08/25/2023)   Received from [facility]   Exercise Vital Sign    Days of Exercise per Week: 1 day    Minutes of Exercise per Session: 60 min  Stress: No Stress Concern Present (08/25/2023)   Received from [facility] - Occupational Stress  Questionnaire    Feeling of Stress : Not at all  Social Connections: Socially Isolated (08/25/2023)   Received from [facility]   Social Connection and Isolation Panel [NHANES]    Frequency of Communication with Friends and Family: Twice a week    Frequency of Social Gatherings with Friends and Family: Never    Attends Religious Services: Never    Database administrator or Organizations: No    Attends Engineer, structural: Never    Marital Status: Separated    Review of Systems: A 12 point ROS discussed and pertinent positives are indicated in the HPI above.  All other systems are negative.  Review of Systems  Unable to perform ROS: Acuity of condition    Vital Signs: BP 128/83   Pulse (!) 111   Temp 98.4 F (36.9 C) (Axillary)   Resp 18   Ht 6' (1.829 m)   Wt 137 lb 2 oz (62.2 kg)   SpO2 91%   BMI 18.60 kg/m     Physical Exam Vitals and nursing note reviewed.  Constitutional:      Appearance: He is well-developed. He is ill-appearing.     Interventions: He is intubated.  HENT:      Head: Normocephalic.  Cardiovascular:     Rate and Rhythm: Regular rhythm. Tachycardia present.  Pulmonary:     Effort: He is intubated.     Comments: Intubated on the vent.  Abdominal:     Comments: OG tube in place  Musculoskeletal:        General: Normal range of motion.     Cervical back: Normal range of motion.  Skin:    General: Skin is warm and dry.  Neurological:     Comments: Unable to assess due to patient condition     Imaging: DG Chest Port 1 View Result Date: 09/21/2023 CLINICAL DATA:  Follow-up in patient. ET tube and orogastric tube placement. EXAM: PORTABLE CHEST 1 VIEW COMPARISON:  09/21/2023 and 09/20/2023.  CT, 04/17/2023. FINDINGS: Endotracheal tube tip projects 2.4 cm above the carina. Nasal/orogastric tube passes at least into the distal esophagus, below the included field of view. Right PICC tip projects in the mid superior vena cava. Cardiac silhouette is normal in size. Lungs are hyperexpanded. There are bilateral thickened interstitial markings stable from the prior exams. No convincing pneumonia and no pulmonary edema. All of the right and part the left lung base excluded from the field of view. No pneumothorax. IMPRESSION: 1. No acute findings. 2. COPD. 3. Support apparatus is well positioned. Endotracheal tube tip now projects 2.4 cm above the carina. Electronically Signed   By: Amie Portland M.D.   On: 09/21/2023 16:15   DG Abd 1 View Result Date: 09/21/2023 CLINICAL DATA:  [id number] Encounter for orogastric (OG) tube placement [id number] EXAM: ABDOMEN - 1 VIEW COMPARISON:  09/19/2023 FINDINGS: Limited radiograph of the lower chest and upper abdomen was obtained for the purposes of enteric tube localization. Enteric tube is seen coursing below the diaphragm with distal tip and side port terminating within the expected location of the distal stomach. Gaseous distension of the colon. IMPRESSION: Enteric tube terminates within the expected location of the distal stomach.  Electronically Signed   By: Duanne Guess D.O.   On: 09/21/2023 16:13   DG Chest Port 1 View Result Date: 09/21/2023 CLINICAL DATA:  Shortness of breath EXAM: PORTABLE CHEST 1 VIEW COMPARISON:  09/20/2023 FINDINGS: Endotracheal tube tip 7 cm above the carina.  Nasogastric tube enters the abdomen. Pulmonary hyperinflation consistent with emphysema as seen previously. Patchy infiltrate in the left mid lung appears similar to prior study. No worsening or new finding. IMPRESSION: 1. Endotracheal tube tip 7 cm above the carina. Nasogastric tube enters the abdomen. 2. Emphysema. 3. Patchy infiltrate in the left mid lung, similar to prior study. Electronically Signed   By: Paulina Fusi M.D.   On: 09/21/2023 14:48   DG Chest 1 View Result Date: 09/20/2023 CLINICAL DATA:  Shortness of breath EXAM: CHEST  1 VIEW COMPARISON:  09/19/2023 FINDINGS: Endotracheal tube tip in the mid intrathoracic trachea. Enteric tube tip at the gastroesophageal junction with side port in the distal esophagus. Recommend advancement 10 cm. Right PICC tip in the mid SVC. Hyperinflation and chronic bronchitic changes. Bilateral small pleural effusions or pleural thickening. No focal consolidation or pneumothorax. IMPRESSION: 1. Enteric tube tip at the gastroesophageal junction with side port in the distal esophagus. Recommend advancement 10 cm. 2. Emphysema and chronic bronchitic changes. Electronically Signed   By: Minerva Fester M.D.   On: 09/20/2023 22:28   DG Abd 1 View Result Date: 09/19/2023 CLINICAL DATA:  Abdominal distension. EXAM: ABDOMEN - 1 VIEW COMPARISON:  September 17, 2023. FINDINGS: The bowel gas pattern is normal. Distal tip of nasogastric tube is seen in expected position of proximal stomach. No radio-opaque calculi or other significant radiographic abnormality are seen. IMPRESSION: No abnormal bowel dilatation is noted. Electronically Signed   By: Lupita Raider M.D.   On: 09/19/2023 08:52   DG Chest Port 1  View Result Date: 09/19/2023 CLINICAL DATA:  Acute respiratory failure with hypoxia. EXAM: PORTABLE CHEST 1 VIEW COMPARISON:  September 17, 2023. FINDINGS: The heart size and mediastinal contours are within normal limits. Limited exam as lung bases are not included in field-of-view. Visualized lung parenchyma is unremarkable. Endotracheal, nasogastric and right-sided PICC lines are unchanged. The visualized skeletal structures are unremarkable. IMPRESSION: Limited exam as lung bases are not included in field-of-view. No definite abnormality seen in visualized lung parenchyma. Electronically Signed   By: Lupita Raider M.D.   On: 09/19/2023 08:51   DG Abd 1 View Result Date: 09/17/2023 CLINICAL DATA:  Acute respiratory failure and hypoxia. Abdominal distension. EXAM: PORTABLE CHEST 1 VIEW COMPARISON:  Chest radiograph dated 09/15/2023. FINDINGS: Endotracheal tube and right-sided PICC in similar position. Enteric tube with side-port in the proximal stomach and tip in the body of the stomach. No significant interval change in bilateral peribronchial and interstitial densities since the prior radiograph. There is background of emphysema. No pleural effusion or pneumothorax. Stable cardiomediastinal silhouette. Top-normal caliber small bowel in the left upper abdomen. No free air. The osseous structures are intact. The soft tissues are unremarkable. IMPRESSION: 1. No significant interval change in bilateral peribronchial and interstitial densities. 2. Support apparatus as above. 3. No definite evidence of obstruction. Electronically Signed   By: Elgie Collard M.D.   On: 09/17/2023 10:50   DG Chest Port 1 View Result Date: 09/17/2023 CLINICAL DATA:  Acute respiratory failure and hypoxia. Abdominal distension. EXAM: PORTABLE CHEST 1 VIEW COMPARISON:  Chest radiograph dated 09/15/2023. FINDINGS: Endotracheal tube and right-sided PICC in similar position. Enteric tube with side-port in the proximal stomach and tip  in the body of the stomach. No significant interval change in bilateral peribronchial and interstitial densities since the prior radiograph. There is background of emphysema. No pleural effusion or pneumothorax. Stable cardiomediastinal silhouette. Top-normal caliber small bowel in the left upper abdomen.  No free air. The osseous structures are intact. The soft tissues are unremarkable. IMPRESSION: 1. No significant interval change in bilateral peribronchial and interstitial densities. 2. Support apparatus as above. 3. No definite evidence of obstruction. Electronically Signed   By: Elgie Collard M.D.   On: 09/17/2023 10:50   CT HEAD WO CONTRAST ( ) Result Date: 09/16/2023 CLINICAL DATA:  Seizure EXAM: CT HEAD WITHOUT CONTRAST TECHNIQUE: Contiguous axial images were obtained from the base of the skull through the vertex without intravenous contrast. RADIATION DOSE REDUCTION: This exam was performed according to the departmental dose-optimization program which includes automated exposure control, adjustment of the mA and/or kV according to patient size and/or use of iterative reconstruction technique. COMPARISON:  None Available. FINDINGS: Brain: No mass,hemorrhage or extra-axial collection. Normal appearance of the parenchyma and CSF spaces. Vascular: No hyperdense vessel or unexpected vascular calcification. Skull: The visualized skull base, calvarium and extracranial soft tissues are normal. Sinuses/Orbits: No fluid levels or advanced mucosal thickening of the visualized paranasal sinuses. No mastoid or middle ear effusion. Normal orbits. Other: None. IMPRESSION: Normal head CT. Electronically Signed   By: Deatra Robinson M.D.   On: 09/16/2023 03:38   ECHOCARDIOGRAM COMPLETE Result Date: 09/15/2023    ECHOCARDIOGRAM REPORT   Patient Name:   KALLEN SINAGRA Date of Exam: 09/14/2023 Medical Rec #:  [id number]      Height:       72.0 in Accession #:    [id number]     Weight:       120.8 lb Date of Birth:   02-25-1961      BSA:          1.720 m Patient Age:    62 years       BP:           98/71 mmHg Patient Gender: M              HR:           110 bpm. Exam Location:  [facility] Procedure: 2D Echo, Cardiac Doppler and Color Doppler Indications:     Elevated Troponin  History:         Patient has no prior history of Echocardiogram examinations.                  COPD; Risk Factors:Dyslipidemia and Current Smoker.  Sonographer:     Mikki Harbor Referring Phys:  Marcina Millard MD Diagnosing Phys: Windell Norfolk  Sonographer Comments: Technically difficult study due to poor echo windows and echo performed with patient supine and on artificial respirator. Image acquisition challenging due to COPD. IMPRESSIONS  1. Technically difficult study. Definity not used. Patient tachycardic at time of evaluation.  2. Left ventricular ejection fraction, by estimation, is 50 to 55%. The left ventricle has low normal function. Left ventricular diastolic parameters are indeterminate.  3. Right ventricular systolic function was not well visualized. The right ventricular size is not well visualized.  4. The mitral valve is normal in structure. No evidence of mitral valve regurgitation.  5. The aortic valve was not well visualized. Aortic valve regurgitation is not visualized.  6. The inferior vena cava is dilated in size with <50% respiratory variability, suggesting right atrial pressure of 15 mmHg. FINDINGS  Left Ventricle: Left ventricular ejection fraction, by estimation, is 50 to 55%. The left ventricle has low normal function. The left ventricular internal cavity size was normal in size. There is no left ventricular hypertrophy. Left ventricular diastolic parameters are indeterminate.  LV  Wall Scoring: The mid and distal anterior wall, mid and distal lateral wall, mid and distal anterior septum, entire apex, entire inferior wall, posterior wall, and mid anterolateral segment are normal. Right Ventricle: The right ventricular size is  not well visualized. Right vetricular wall thickness was not well visualized. Right ventricular systolic function was not well visualized. The tricuspid regurgitant velocity is 1.32 m/s, and with an assumed right atrial pressure of 8 mmHg, the estimated right ventricular systolic pressure is 15.0 mmHg. Left Atrium: Left atrial size was normal in size. Right Atrium: Right atrial size was normal in size. Pericardium: There is no evidence of pericardial effusion. Mitral Valve: The mitral valve is normal in structure. No evidence of mitral valve regurgitation. MV peak gradient, 2.7 mmHg. The mean mitral valve gradient is 1.0 mmHg. Tricuspid Valve: The tricuspid valve is normal in structure. Tricuspid valve regurgitation is trivial. Aortic Valve: The aortic valve was not well visualized. Aortic valve regurgitation is not visualized. Aortic valve mean gradient measures 2.0 mmHg. Aortic valve peak gradient measures 2.9 mmHg. Aortic valve area, by VTI measures 2.59 cm. Pulmonic Valve: The pulmonic valve was not well visualized. Pulmonic valve regurgitation is not visualized. Aorta: The aortic root is normal in size and structure. Venous: The inferior vena cava is dilated in size with less than 50% respiratory variability, suggesting right atrial pressure of 15 mmHg. IAS/Shunts: The interatrial septum was not well visualized.  LEFT VENTRICLE PLAX 2D LVIDd:         3.40 cm   Diastology LVIDs:         2.50 cm   LV e' medial:    8.59 cm/s LV PW:         1.00 cm   LV E/e' medial:  7.7 LV IVS:        0.90 cm   LV e' lateral:   5.44 cm/s LVOT diam:     1.80 cm   LV E/e' lateral: 12.1 LV SV:         32 LV SV Index:   18 LVOT Area:     2.54 cm  LEFT ATRIUM           Index       RIGHT ATRIUM          Index LA diam:      1.60 cm 0.93 cm/m  RA Area:     6.86 cm LA Vol (A4C): 6.3 ml  3.66 ml/m  RA Volume:   11.60 ml 6.75 ml/m  AORTIC VALVE                    PULMONIC VALVE AV Area (Vmax):    2.59 cm     PV Vmax:       0.69 m/s AV  Area (Vmean):   2.27 cm     PV Peak grad:  1.9 mmHg AV Area (VTI):     2.59 cm AV Vmax:           85.20 cm/s AV Vmean:          55.700 cm/s AV VTI:            0.123 m AV Peak Grad:      2.9 mmHg AV Mean Grad:      2.0 mmHg LVOT Vmax:         86.80 cm/s LVOT Vmean:        49.600 cm/s LVOT VTI:  0.125 m LVOT/AV VTI ratio: 1.02  AORTA Ao Root diam: 3.40 cm MITRAL VALVE               TRICUSPID VALVE MV Area (PHT): 2.84 cm    TR Peak grad:   7.0 mmHg MV Area VTI:   1.62 cm    TR Vmax:        132.00 cm/s MV Peak grad:  2.7 mmHg MV Mean grad:  1.0 mmHg    SHUNTS MV Vmax:       0.83 m/s    Systemic VTI:  0.12 m MV Vmean:      50.7 cm/s   Systemic Diam: 1.80 cm MV Decel Time: 267 msec MV E velocity: 65.80 cm/s MV A velocity: 59.60 cm/s MV E/A ratio:  1.10 Windell Norfolk Electronically signed by Windell Norfolk Signature Date/Time: 09/15/2023/11:00:13 AM    Final    DG Chest Port 1 View Result Date: 09/15/2023 CLINICAL DATA:  63 year old male with respiratory failure. EXAM: PORTABLE CHEST 1 VIEW COMPARISON:  Portable chest yesterday and earlier. FINDINGS: Portable AP semi upright view at 0815 hours. Hyperinflated lungs. Stable lines and tubes. Coarse reticulonodular opacity about the left hilum and in the left lower lung has developed since 07/13/2024 and appears progressed from yesterday. No pneumothorax, pleural effusion or other acute lung opacity. Negative visible bowel gas. Stable visualized osseous structures. IMPRESSION: 1. Stable and satisfactory lines and tubes. 2. Evidence of chronic pulmonary hyperinflation. Multilobar left lung bronchopneumonia has developed since 09/13/2023. No pleural effusion is evident. Electronically Signed   By: Odessa Fleming M.D.   On: 09/15/2023 08:57   DG Chest Port 1 View Result Date: 09/14/2023 CLINICAL DATA:  [id number] Respiratory failure with hypoxia (HCC) [id number] EXAM: PORTABLE CHEST 1 VIEW COMPARISON:  09/13/2023 FINDINGS: Interval placement of a right sided PICC line with  distal tip terminating at the level of the distal SVC. Endotracheal and enteric tubes remain appropriately positioned. Normal heart size. Aortic atherosclerosis. Hyperinflated lungs. Slightly increased bibasilar opacities. No pneumothorax. IMPRESSION: 1. Interval placement of a right-sided PICC line with distal tip terminating at the level of the distal SVC. 2. Slightly increased bibasilar opacities, which may represent atelectasis, aspiration, or pneumonia. Electronically Signed   By: Duanne Guess D.O.   On: 09/14/2023 09:52   [place] EKG SITE RITE Result Date: 09/13/2023 If Site Rite image not attached, placement could not be confirmed due to current cardiac rhythm.  DG Abd 1 View Result Date: 09/13/2023 CLINICAL DATA:  63 year old male intubated and enteric tube placement. EXAM: ABDOMEN - 1 VIEW COMPARISON:  Portable chest today. FINDINGS: AP supine view 0943 hours. Enteric tube placed into the stomach, side hole the level of the gastric cardia. Negative left lung base. Nonobstructed bowel-gas pattern. IMPRESSION: Satisfactory enteric tube placement into the stomach. Electronically Signed   By: Odessa Fleming M.D.   On: 09/13/2023 10:07   DG Chest Port 1 View Result Date: 09/13/2023 CLINICAL DATA:  63 year old male intubated, enteric tube placement. EXAM: PORTABLE CHEST 1 VIEW COMPARISON:  0904 hours today. FINDINGS: Portable AP upright view at 0944 hours. Intubated now. Endotracheal tube tip in good position over the tracheal air column between the clavicles and carina. Enteric tube courses toward the abdomen. Large lung volumes. Stable ventilation. Stable cardiac size and mediastinal contours. No pneumothorax. Stable visualized osseous structures. IMPRESSION: 1. Satisfactory endotracheal tube. Enteric tube courses to the abdomen. 2. Hyperinflation with stable lung ventilation. Electronically Signed   By: Althea Grimmer.D.  On: 09/13/2023 10:01   DG Chest Port 1 View Result Date: 09/13/2023 CLINICAL DATA:  Acute  hypoxic respiratory failure EXAM: PORTABLE CHEST 1 VIEW COMPARISON:  09/10/2023 FINDINGS: There is hyperinflation of the lungs compatible with COPD. Heart and mediastinal contours are within normal limits. No focal opacities or effusions. No acute bony abnormality. IMPRESSION: COPD.  No active disease. Electronically Signed   By: Charlett Nose M.D.   On: 09/13/2023 09:12   DG Chest 2 View Result Date: 09/10/2023 CLINICAL DATA:  Suspected sepsis. EXAM: CHEST - 2 VIEW COMPARISON:  Chest radiograph dated 02/26/2023. FINDINGS: Background of emphysema. No focal consolidation, pleural effusion, or pneumothorax. The cardiac silhouette is within normal limits. No acute osseous pathology. IMPRESSION: 1. No active cardiopulmonary disease. 2. Emphysema. Electronically Signed   By: Elgie Collard M.D.   On: 09/10/2023 14:19    Labs:  CBC: Recent Labs    09/20/23 0406 09/21/23 0333 09/22/23 0400 09/23/23 0519  WBC 9.4 9.1 8.6 8.5  HGB 9.8* 9.6* 9.0* 9.1*  HCT 30.6* 30.0* 27.6* 29.3*  PLT 241 309 265 294    COAGS: Recent Labs    09/10/23 1312 09/13/23 1148 09/13/23 1725 09/17/23 0412 09/17/23 1253 09/17/23 1951 09/18/23 0155 09/22/23 1427  INR 1.2 1.1  --   --   --   --   --  1.1  APTT  --  25   < > 46* 54* 83* 82*  --    < > = values in this interval not displayed.    BMP: Recent Labs    09/20/23 0406 09/21/23 0333 09/22/23 0400 09/23/23 0519  NA 133* 133* 136 138  K 4.1 4.1 4.1 3.8  CL 92* 91* 95* 92*  CO2 33* 32 34* 36*  GLUCOSE 183* 169* 142* 152*  BUN 26* 30* 26* 31*  CALCIUM 8.8* 8.4* 8.4* 8.6*  CREATININE 0.53* 0.51* 0.45* 0.60*  GFRNONAA >60 >60 >60 >60    LIVER FUNCTION TESTS: Recent Labs    09/10/23 1312 09/13/23 1350 09/18/23 0446 09/19/23 0556 09/20/23 0406 09/21/23 0333 09/22/23 0400  BILITOT 1.0 0.6  --   --   --   --   --   AST 20 31  --   --   --   --   --   ALT 20 24  --   --   --   --   --   ALKPHOS 79 76  --   --   --   --   --   PROT 7.1 6.2*   --   --   --   --   --   ALBUMIN 3.8 3.4*   < > 2.4* 2.5* 2.5* 2.2*   < > = values in this interval not displayed.     Assessment and Plan:  63 y.o. male inpatient. Smoker. History of DM, GERD, HLD, COPD On home O2 2-3L), DVT (on Eliquis). Admitted on 09/10/2023 with acute on chronic hypoxic and hypercapnic respiratory failure in the setting of AECOPD with RSV pneumonia. Patient now in ICU intubated. Team is requesting a gastrostomy tube placement for long term nutritional access.   PLAN: IR Image Guided Gastrostomy Tube Placement. Tentatively scheduled for 1.23.25  Risks and benefits image guided gastrostomy tube placement was discussed with the patient including, but not limited to the need for a barium enema during the procedure, bleeding, infection, peritonitis and/or damage to adjacent structures.  All of the patient's wife's questions were answered, patient's wife is  agreeable to proceed.  Consent signed and in IR Control Room   Thank you for this interesting consult.  I greatly enjoyed meeting Caesare Bolton and look forward to participating in their care.  A copy of this report was sent to the requesting provider on this date.  Electronically Signed: Alene Mires, NP 09/23/2023, 10:53 AM   I spent a total of 40 Minutes    in face to face in clinical consultation, greater than 50% of which was counseling/coordinating care for Gastrostomy tube placement

## 2023-09-23 NOTE — Progress Notes (Signed)
NAME:  Benjamin Bolton, MRN:  [id number], DOB:  August 12, 1961, LOS: 13 ADMISSION DATE:  09/10/2023, CONSULTATION DATE: 09/13/2023  Brief Pt Description / Synopsis:  63 y.o. 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   History of Present Illness:  Benjamin Bolton is a 63 year old male with a history of severe COPD followed by Benjamin Bolton who presents to the hospital from [facility] clinic on 1/8 with increased shortness of breath. He was admitted to the medicine service for management of COPD exacerbation and found to have an RSV infection.   Patient was admitted to [facility] and initiated on nebulizers, steroids, and antibiotics. He was managed medically for COPD exacerbation and UTI but developed worsening respiratory distress this AM requiring the initiation of BiPAP. Rapid response was called and he was transferred to the ICU. Initial blood gas showed pH of 7.2, with repeat VBG showing he remained acidemic at 7.18. Given altered mental status and respiratory failure, decision made at the bedside to proceed with emergent intubation.  Pertinent  Medical History  DVT on Eliquis COPD on 2L O2 Type II Diabetes Mellitus  GERD HLD Pulmonary Nodule Tobacco Abuse   Micro Data:  1/8: SARS-CoV-2 PCR>>negative 1/8: Blood culture x2>> no growth 1/8: Urine>> multiple species (suggest recollection) 1/9: RVP>> + RSV 1/9: Sputum>> normal respiratory flora 1/9: HIV Screen>> non reactive 1/11: MRSA PCR>> negative 1/19: MRSA PCR>> 1/19: RVP>>  Antimicrobials:   Anti-infectives (From admission, onward)    Start     Dose/Rate Route Frequency Ordered Stop   09/22/23 0010  vancomycin (VANCOCIN) IVPB 750 mg/150 ml premix  Status:  Discontinued        750 mg 150 mL/hr over 60 Minutes Intravenous Every 12  hours 09/22/23 0004 09/22/23 1039   09/22/23 0000  Vancomycin (VANCOCIN) 750 mg IVPB  Status:  Discontinued        750 mg 150 mL/hr over 60 Minutes Intravenous Every 12 hours 09/21/23 1120 09/21/23 2356   09/22/23 0000  vancomycin (VANCOREADY) IVPB 750 mg/150 mL  Status:  Discontinued        750 mg 150 mL/hr over 60 Minutes Intravenous Every 12 hours 09/21/23 2356 09/22/23 0004   09/21/23 1300  vancomycin (VANCOCIN) IVPB 1000 mg/200 mL premix        1,000 mg 200 mL/hr over 60 Minutes Intravenous  Once 09/21/23 1120 09/21/23 1900   09/21/23 1215  piperacillin-tazobactam (ZOSYN) IVPB 3.375 g        3.375 g 12.5 mL/hr over 240 Minutes Intravenous Every 8 hours 09/21/23 1120 09/27/23 2359   09/21/23 1130  vancomycin (VANCOREADY) IVPB 1250 mg/250 mL  Status:  Discontinued        1,250 mg 166.7 mL/hr over 90 Minutes Intravenous  Once 09/21/23 1046 09/21/23 1120   09/21/23 1100  piperacillin-tazobactam (ZOSYN) IVPB 3.375 g  Status:  Discontinued        3.375 g 12.5 mL/hr over 240 Minutes Intravenous  Once 09/21/23 1046 09/21/23 1152   09/13/23 1445  piperacillin-tazobactam (ZOSYN) IVPB 3.375 g        3.375 g 12.5 mL/hr over 240 Minutes Intravenous Every 8 hours 09/13/23 1351 09/17/23 0157   09/10/23 2315  cefTRIAXone (ROCEPHIN) 1 g in sodium chloride 0.9 % 100 mL IVPB  Status:  Discontinued  1 g 200 mL/hr over 30 Minutes Intravenous Every 24 hours 09/10/23 2305 09/13/23 1336      Significant Hospital Events: Including procedures, antibiotic start and stop dates in addition to other pertinent events   1/08: admit to [facility] 1/11: rapid response, transfer to ICU, intubated 1/12: remains vented, sedated, does not follow commands.  Failed SBT  1/13: Overnight due to concerns of aspiration TF's held.  Will repeat SBT today 1/14: Overnight pt had possible seizure activity with rhythmic tremors noted in the mouth/lower face/neck during episode pt unresponsive to pain.  Received 2 mg of versed  and 2g keppra bolus with resolution of symptoms.  CT Head negative.  Standing dose of keppra 1g bid.  Neurology consulted.    1/15: On minimal vent settings, unable to tolerate WUA due to increased WOB. Shifting measures given for Hyperkalemia of 5.5 with peaked T waves.  Abdomen distended, KUB without evidence of obstruction, give Lactulose.  Diurese with 40 mg IV Lasix x1. 1/16: Failed WUA, with increased WOB and hypoxic with O2 sats dropping to mid 70's.  Add Seroquel.  Palliative Care consulted to assist with GOC. 1/17: Precedex started for WUA.  Failed SBT due to increased WOB, tachypnea, and hypoxia.  Increase Seroquel to 50 mg BID. Transition Heparin to Elqiuis 1/18: Pt remains mechanically intubated and has failed multiple SBT's.  Planning for family meeting today at bedside with palliative care and PCCM will perform SBT once family arrives at bedside.  Remains sedated with propofol and fentanyl gtts.  Family meeting held code status changed to DNR per family request.  Pts family trying to decide if they want to proceed with tracheostomy.  Required additional sedation and prn vecuronium overnight due to vent dyssynchrony  1/19: Pt febrile overnight with increased vasopressor requirements levophed gtt @12  mcg/min and febrile tmax 101 degrees F concerning for recurrent sepsis.  Broad spectrum abx started. Pt remains mechanically intubated and unable to successfully liberate from ventilator  1/20 remains on vent, severe rap failure 1/21 remains on vent prolonged exp phase air trapping  on vent  Interim History / Subjective:  Remains on vent Severe end stage COPD Failure to wean from vent Family has consent for Trach and PEG tube Severe resp failure Remains critically ill ENT and IR consulted  Objective   Blood pressure 129/85, pulse (!) 108, temperature 99.1 F (37.3 C), temperature source Oral, resp. rate 13, height 6' (1.829 m), weight 62.2 kg, SpO2 98%.    Vent Mode: PRVC FiO2 (%):   [30 %] 30 % Set Rate:  [12 bmp] 12 bmp Vt Set:  [570 mL] 570 mL PEEP:  [8 cmH20] 8 cmH20 Plateau Pressure:  [26 cmH20-36 cmH20] 26 cmH20   Intake/Output Summary (Last 24 hours) at 09/23/2023 0727 Last data filed at 09/23/2023 0358 Gross per 24 hour  Intake 2123.15 ml  Output 2450 ml  Net -326.85 ml   Filed Weights   09/21/23 0400 09/22/23 0400 09/23/23 0414  Weight: 57.2 kg 61.1 kg 62.2 kg       REVIEW OF SYSTEMS  PATIENT IS UNABLE TO PROVIDE COMPLETE REVIEW OF SYSTEMS DUE TO SEVERE CRITICAL ILLNESS   PHYSICAL EXAMINATION:  GENERAL:critically ill appearing, +resp distress EYES: Pupils equal, round, reactive to light.  No scleral icterus.  MOUTH: Moist mucosal membrane. INTUBATED NECK: Supple.  PULMONARY: Lungs clear to auscultation, +rhonchi, +wheezing CARDIOVASCULAR: S1 and S2.  Regular rate and rhythm GASTROINTESTINAL: Soft, nontender, -distended. Positive bowel sounds.  MUSCULOSKELETAL: No swelling, clubbing, or  edema.  NEUROLOGIC: obtunded,sedated SKIN:normal, warm to touch, Capillary refill delayed  Pulses present bilaterally      Assessment & Plan:  63 yo AAM with Acute  metabolic encephalopathy due to severe hypoxic and hypercapnic resp failure due to severe COPD exacerbation with sepsis and RSV pneumonia complicated by demand ischemia   Severe ACUTE Hypoxic and Hypercapnic Respiratory Failure -continue Mechanical Ventilator support -Wean Fio2 and PEEP as tolerated -VAP/VENT bundle implementation - Wean PEEP & FiO2 as tolerated, maintain SpO2 > 88% - Head of bed elevated 30 degrees, VAP protocol in place - Plateau pressures less than 30 cm H20  - Intermittent chest x-ray & ABG PRN - Ensure adequate pulmonary hygiene  Failure to wean from vent Needs TRACH to survive  SEVERE COPD EXACERBATION -continue IV steroids as prescribed  INFECTIOUS DISEASE RSV pneumonia -continue antibiotics as prescribed -follow up cultures   NEUROLOGY ACUTE METABOLIC  ENCEPHALOPATHY -need for sedation -Goal RASS -2 to -3 Mechanically intubation pain/discomfort  Possible seizure activity~very low suspicion   - WUA daily - Continue thiamine and mvi  - Keppra was discontinued - Seizure precautions  - Continue seroquel to 50 mg BID to assist with WUA   CARDIAC  Mildly elevated troponin secondary to demand ischemia  Hypotension suspect secondary to possible sepsis and sedating medication  HOLD ALL ATIPLT AND AC therapy for prcodeures Hx: HLD and DVT on Eliquis Echo 09/14/23: 50 to 55%, indeterminate diastolic parameters - Cardiology signed off no further cardiac evaluations indicated at this time   ACUTE ANEMIA- TRANSFUSE AS NEEDED CONSIDER TRANSFUSION  IF HGB<7   ENDO - ICU hypoglycemic\Hyperglycemia protocol -check FSBS per protocol   GI GI PROPHYLAXIS as indicated  NUTRITIONAL STATUS DIET-->TF's as tolerated Constipation protocol as indicated IR to place PEG tube  ELECTROLYTES -follow labs as needed -replace as needed -pharmacy consultation and following    Pt is critically ill with AECOPD in the setting of RSV and pneumonia.  Anticipate difficulty in liberating from mechanical ventilation.  May end up needing Tracheostomy.  Scheduled meeting on Monday 09/22/23 at 1300  Best Practice (right click and "Reselect all SmartList Selections" daily)   Diet/type: TF's DVT prophylaxis Apixaban  Pressure ulcer(s): N/A GI prophylaxis: Pepcid  Lines: RUE PICC Line and still needed  Foley:  External Catheter  Code Status: DNR Last date of multidisciplinary goals of care discussion [09/22/2023]  01/20: proceed with trach and PEG tube Labs   CBC: Recent Labs  Lab 09/17/23 0412 09/18/23 0155 09/19/23 0556 09/20/23 0406 09/21/23 0333 09/22/23 0400 09/23/23 0519  WBC 11.6*   < > 7.5 9.4 9.1 8.6 8.5  NEUTROABS 9.7*  --   --   --   --   --   --   HGB 10.5*   < > 9.3* 9.8* 9.6* 9.0* 9.1*  HCT 32.2*   < > 29.6* 30.6* 30.0* 27.6*  29.3*  MCV 95.3   < > 96.7 93.9 97.7 94.8 98.0  PLT 174   < > 208 241 309 265 294   < > = values in this interval not displayed.    Basic Metabolic Panel: Recent Labs  Lab 09/17/23 0412 09/17/23 1529 09/18/23 0155 09/18/23 0446 09/19/23 0556 09/20/23 0406 09/21/23 0333 09/22/23 0400 09/23/23 0519  NA 133*   < >  --  130* 133* 133* 133* 136 138  K 5.5*   < >  --  4.8 4.8 4.1 4.1 4.1 3.8  CL 90*   < >  --  87* 89* 92* 91* 95* 92*  CO2 31   < >  --  35* 34* 33* 32 34* 36*  GLUCOSE 119*   < >  --  120* 107* 183* 169* 142* 152*  BUN 25*   < >  --  25* 23 26* 30* 26* 31*  CREATININE 0.73   < >  --  0.52* 0.62 0.53* 0.51* 0.45* 0.60*  CALCIUM 8.8*   < >  --  9.0 8.8* 8.8* 8.4* 8.4* 8.6*  MG 2.5*  --  2.4  --  2.2  --   --  2.1  --   PHOS 2.8  --   --  2.9 3.0 3.0 3.2 2.6  --    < > = values in this interval not displayed.   GFR: Estimated Creatinine Clearance: 85.3 mL/min (A) (by C-G formula based on SCr of 0.6 mg/dL (L)). Recent Labs  Lab 09/20/23 0406 09/21/23 0333 09/22/23 0400 09/23/23 0519  PROCALCITON  --  1.55  --   --   WBC 9.4 9.1 8.6 8.5  LATICACIDVEN  --  1.0  --   --     Liver Function Tests: Recent Labs  Lab 09/18/23 0446 09/19/23 0556 09/20/23 0406 09/21/23 0333 09/22/23 0400  ALBUMIN 2.6* 2.4* 2.5* 2.5* 2.2*   No results for input(s): "LIPASE", "AMYLASE" in the last 168 hours. No results for input(s): "AMMONIA" in the last 168 hours.  ABG    Component Value Date/Time   PHART 7.43 09/20/2023 2332   PCO2ART 62 (H) 09/20/2023 2332   PO2ART 53 (L) 09/20/2023 2332   HCO3 41.2 (H) 09/20/2023 2332   O2SAT 86.6 09/20/2023 2332     Coagulation Profile: Recent Labs  Lab 09/22/23 1427  INR 1.1     Cardiac Enzymes: No results for input(s): "CKTOTAL", "CKMB", "CKMBINDEX", "TROPONINI" in the last 168 hours.  HbA1C: Hgb A1c MFr Bld  Date/Time Value Ref Range Status  09/15/2023 09:24 PM 6.4 (H) 4.8 - 5.6 % Final    Comment:    (NOTE) Pre  diabetes:          5.7%-6.4%  Diabetes:              >6.4%  Glycemic control for   <7.0% adults with diabetes     CBG: Recent Labs  Lab 09/22/23 1122 09/22/23 1614 09/22/23 1943 09/22/23 2351 09/23/23 0356  GLUCAP 216* 221* 137* 170* 202*    Review of Systems:   Unable to assess pt mechanically intubated   Past Medical History:  He,  has a past medical history of COPD (chronic obstructive pulmonary disease) (HCC), DVT (deep venous thrombosis) (HCC), Dyspnea, GERD (gastroesophageal reflux disease), HLD (hyperlipidemia), migraines, Oxygen dependent, Pneumonia (2023), Pre-diabetes, Pulmonary nodule, Rotator cuff tear, right, Tobacco abuse, and Tobacco use.   Surgical History:   Past Surgical History:  Procedure Laterality Date   CHEST TUBE INSERTION     SHOULDER ARTHROSCOPY WITH SUBACROMIAL DECOMPRESSION, ROTATOR CUFF REPAIR AND BICEP TENDON REPAIR Right 02/18/2023   Procedure: RIGHT SHOULDER ARTHROSCOPY WITH DEBRIDEMENT, DECOMPRESSION, ROTATOR CUFF REPAIR, BICEPS TENODESIS.;  Surgeon: Christena Flake, MD;  Location: [facility] ORS;  Service: Orthopedics;  Laterality: Right;     Social History:   reports that he has been smoking cigarettes. He has a 12 pack-year smoking history. He has never used smokeless tobacco. He reports current alcohol use. He reports that he does not use drugs.   Family History:  His family history is not on file.  Allergies No Active Allergies   Home Medications  Prior to Admission medications   Medication Sig Start Date End Date Taking? Authorizing Provider  albuterol (PROVENTIL) (2.5 MG/3ML) 0.083% nebulizer solution USE 1 VIAL IN NEBULIZER EVERY 6 HOURS AS NEEDED FOR WHEEZING 06/28/21  Yes [provider]  albuterol (VENTOLIN HFA) 108 (90 Base) MCG/ACT inhaler Inhale 1 puff into the lungs every 4 (four) hours as needed for wheezing or shortness of breath.   Yes [provider]  APIXABAN Everlene Balls) VTE STARTER PACK (10MG  AND 5MG ) Take  as directed on package: start with two-5mg  tablets twice daily for 7 days. On day 8, switch to one-5mg  tablet twice daily. 02/26/23  Yes Merwyn Katos, MD  ondansetron (ZOFRAN) 4 MG tablet Take 1 tablet (4 mg total) by mouth every 6 (six) hours as needed for nausea. 02/19/23  Yes Anson Oregon, PA-C  oxyCODONE (OXY IR/ROXICODONE) 5 MG immediate release tablet Take 1 tablet by mouth 2 (two) times daily as needed. 08/29/23  Yes [provider]  OXYGEN Inhale 3 L into the lungs continuous.   Yes [provider]  predniSONE (DELTASONE) 10 MG tablet Take 2-6 tablets by mouth daily with breakfast.  ORIGINAL ZOX:WRUE 6 tabs daily x4 days; then 4 tabs daily x4 days; then 2 tabs daily x4 days.   Yes [provider]  predniSONE (DELTASONE) 20 MG tablet Take 20 mg by mouth daily with breakfast.   Yes [provider]  TRELEGY ELLIPTA 100-62.5-25 MCG/ACT AEPB Inhale 1 puff into the lungs every morning. 07/04/21  Yes [provider]  amoxicillin-clavulanate (AUGMENTIN) 875-125 MG tablet Take 1 tablet by mouth every 12 (twelve) hours. Patient not taking: Reported on 09/10/2023    [provider]  benralizumab (FASENRA PEN) 30 MG/ML prefilled autoinjector Inject 30 mg into the skin as directed. Patient not taking: Reported on 09/10/2023 07/23/23   [provider]  nystatin (MYCOSTATIN) 100000 UNIT/ML suspension Take 5 mLs by mouth 4 (four) times daily. Patient not taking: Reported on 09/10/2023 09/04/23   [provider]  omeprazole (PRILOSEC) 20 MG capsule Take 20 mg by mouth daily. Patient not taking: Reported on 09/10/2023    [provider]  oxyCODONE (OXY IR/ROXICODONE) 5 MG immediate release tablet Take 1-2 tablets (5-10 mg total) by mouth every 4 (four) hours as needed for severe pain. 02/19/23   Anson Oregon, PA-C       DVT/GI PRX  assessed I Assessed the need for Labs I Assessed the need for Foley I Assessed the need  for Central Venous Line Family Discussion when available I Assessed the need for Mobilization I made an Assessment of medications to be adjusted accordingly Safety Risk assessment completed  CASE DISCUSSED IN MULTIDISCIPLINARY ROUNDS WITH ICU TEAM     Critical Care Time devoted to patient care services described in this note is 45 minutes.  Critical care was necessary to treat /prevent imminent and life-threatening deterioration. Overall, patient is critically ill, prognosis is guarded.  Patient with Multiorgan failure and at high risk for cardiac arrest and death.    Lucie Leather, M.D.  [facility]  Medical Director [facility] Medical Director [facility]

## 2023-09-24 ENCOUNTER — Inpatient Hospital Stay: Payer: 59

## 2023-09-24 DIAGNOSIS — D649 Anemia, unspecified: Secondary | ICD-10-CM

## 2023-09-24 DIAGNOSIS — J441 Chronic obstructive pulmonary disease with (acute) exacerbation: Secondary | ICD-10-CM | POA: Diagnosis not present

## 2023-09-24 DIAGNOSIS — J9601 Acute respiratory failure with hypoxia: Secondary | ICD-10-CM | POA: Diagnosis not present

## 2023-09-24 DIAGNOSIS — J9621 Acute and chronic respiratory failure with hypoxia: Secondary | ICD-10-CM | POA: Diagnosis not present

## 2023-09-24 DIAGNOSIS — G9341 Metabolic encephalopathy: Secondary | ICD-10-CM | POA: Diagnosis not present

## 2023-09-24 DIAGNOSIS — Z515 Encounter for palliative care: Secondary | ICD-10-CM | POA: Diagnosis not present

## 2023-09-24 DIAGNOSIS — J9602 Acute respiratory failure with hypercapnia: Secondary | ICD-10-CM | POA: Diagnosis not present

## 2023-09-24 DIAGNOSIS — Z720 Tobacco use: Secondary | ICD-10-CM | POA: Diagnosis not present

## 2023-09-24 LAB — BASIC METABOLIC PANEL
Anion gap: 9 (ref 5–15)
BUN: 34 mg/dL — ABNORMAL HIGH (ref 8–23)
CO2: 37 mmol/L — ABNORMAL HIGH (ref 22–32)
Calcium: 8.9 mg/dL (ref 8.9–10.3)
Chloride: 93 mmol/L — ABNORMAL LOW (ref 98–111)
Creatinine, Ser: 0.45 mg/dL — ABNORMAL LOW (ref 0.61–1.24)
GFR, Estimated: >60 mL/min (ref >60)
Glucose, Bld: 209 mg/dL — ABNORMAL HIGH (ref 70–99)
Potassium: 4.9 mmol/L (ref 3.5–5.1)
Sodium: 139 mmol/L (ref 135–145)

## 2023-09-24 LAB — CBC
HCT: 30.3 % — ABNORMAL LOW (ref 39.0–52.0)
Hemoglobin: 9.1 g/dL — ABNORMAL LOW (ref 13.0–17.0)
MCH: 30.5 pg (ref 26.0–34.0)
MCHC: 30 g/dL (ref 30.0–36.0)
MCV: 101.7 fL — ABNORMAL HIGH (ref 80.0–100.0)
Platelets: 326 10*3/uL (ref 150–400)
RBC: 2.98 MIL/uL — ABNORMAL LOW (ref 4.22–5.81)
RDW: 15.3 % (ref 11.5–15.5)
WBC: 13.2 10*3/uL — ABNORMAL HIGH (ref 4.0–10.5)
nRBC: 0.8 % — ABNORMAL HIGH (ref 0.0–0.2)

## 2023-09-24 LAB — GLUCOSE, CAPILLARY
Glucose-Capillary: 156 mg/dL — ABNORMAL HIGH (ref 70–99)
Glucose-Capillary: 140 mg/dL — ABNORMAL HIGH (ref 70–99)
Glucose-Capillary: 131 mg/dL — ABNORMAL HIGH (ref 70–99)
Glucose-Capillary: 89 mg/dL (ref 70–99)
Glucose-Capillary: 84 mg/dL (ref 70–99)
Glucose-Capillary: 95 mg/dL (ref 70–99)

## 2023-09-24 LAB — MAGNESIUM: Magnesium: 2.2 mg/dL (ref 1.7–2.4)

## 2023-09-24 LAB — TRIGLYCERIDES: Triglycerides: 349 mg/dL — ABNORMAL HIGH (ref <150)

## 2023-09-24 LAB — PHOSPHORUS: Phosphorus: 2.6 mg/dL (ref 2.5–4.6)

## 2023-09-24 MED ORDER — FUROSEMIDE 10 MG/ML IJ SOLN
40.0000 mg | Freq: Once | INTRAMUSCULAR | Status: AC
  Administered 2023-09-24: 40 mg via INTRAVENOUS
  Filled 2023-09-24: qty 4

## 2023-09-24 MED ORDER — ACETAZOLAMIDE SODIUM 500 MG IJ SOLR
500.0000 mg | Freq: Once | INTRAMUSCULAR | Status: AC
  Administered 2023-09-24: 500 mg via INTRAVENOUS
  Filled 2023-09-24: qty 500

## 2023-09-24 MED ORDER — VECURONIUM BROMIDE 10 MG IV SOLR: 20.0000 mg | INTRAVENOUS | Status: DC | PRN

## 2023-09-24 MED ORDER — MIDAZOLAM-SODIUM CHLORIDE 100-0.9 MG/100ML-% IV SOLN
0.5000 mg/h | INTRAVENOUS | Status: DC
  Administered 2023-09-24: 0.5 mg/h via INTRAVENOUS
  Administered 2023-09-25 – 2023-09-27 (×2): 2.5 mg/h via INTRAVENOUS
  Administered 2023-09-28: 4.5 mg/h via INTRAVENOUS
  Filled 2023-09-24 (×4): qty 100

## 2023-09-24 MED ORDER — MIDAZOLAM BOLUS VIA INFUSION
1.0000 mg | INTRAVENOUS | Status: DC | PRN
  Administered 2023-09-24: 2 mg via INTRAVENOUS
  Administered 2023-09-28: 1 mg via INTRAVENOUS

## 2023-09-24 MED ORDER — VECURONIUM BROMIDE 10 MG IV SOLR
10.0000 mg | Freq: Once | INTRAVENOUS | Status: AC
  Administered 2023-09-24: 10 mg via INTRAVENOUS
  Filled 2023-09-24: qty 10

## 2023-09-24 NOTE — Consult Note (Signed)
Inpatient Consult Note  Attending:   Magnus Ivan. Okey Dupre, MD, MBA, FARS   Otolaryngology-Head & Neck Surgery  This patient was seen today in at the request of No referring provider defined for this encounter. in regard to  Chief Complaint  Patient presents with   Shortness of Breath     CHIEF COMPLAINT:   Chief Complaint  Patient presents with   Shortness of Breath     HPI:  The patient is a 63 y.o. old child who presents today with complaint of  Chief Complaint  Patient presents with   Shortness of Breath    63 y.o. 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   Mr. Corwell is followed by [facility] Pulmonology who presents to the hospital from [facility] clinic on 1/8 with increased shortness of breath. He was admitted to the medicine service for management of COPD exacerbation and found to have an RSV infection.   Patient was admitted to [facility] and initiated on nebulizers, steroids, and antibiotics. He was managed medically for COPD exacerbation and UTI but developed worsening respiratory distress this AM requiring the initiation of BiPAP. Rapid response was called and he was transferred to the ICU. Initial blood gas showed pH of 7.2, with repeat VBG showing he remained acidemic at 7.18. Given altered mental status and respiratory failure, decision made at the bedside to proceed with emergent intubation.  Asked to see by Dr. Belia Heman this past Monday, 09/24/23, for failure to wean from ventilatory support and possible tracheotomy placement.  The ICU team has also requested a PEG tube.  He is on Eliquis, but this has been held and coags will be checked later in the week prior to surgery per the ICU team.   REVIEW OF SYSTEMS: The patient / family denies any recent history of fever, night sweats  or weight loss, pain, cyanosis, clubbing or edema, respiratory distress, dizziness or imbalance.   PAST MEDICAL HISTORY: Past Medical History:  Diagnosis Date   COPD (chronic obstructive pulmonary disease) (HCC)    DVT (deep venous thrombosis) (HCC)    Dyspnea    GERD (gastroesophageal reflux disease)    no meds   HLD (hyperlipidemia)    Hx of migraines    Oxygen dependent    3 L [place]   Pneumonia 2023   Pre-diabetes    Pulmonary nodule    Rotator cuff tear, right    Tobacco abuse    Tobacco use       SURGICAL HISTORY: Past Surgical History:  Procedure Laterality Date   CHEST TUBE INSERTION     SHOULDER ARTHROSCOPY WITH SUBACROMIAL DECOMPRESSION, ROTATOR CUFF REPAIR AND BICEP TENDON REPAIR Right 02/18/2023   Procedure: RIGHT SHOULDER ARTHROSCOPY WITH DEBRIDEMENT, DECOMPRESSION, ROTATOR CUFF REPAIR, BICEPS TENODESIS.;  Surgeon: Christena Flake, MD;  Location: [facility] ORS;  Service: Orthopedics;  Laterality: Right;     MEDICATIONS:  Current Facility-Administered Medications:    acetaminophen (TYLENOL) tablet 650 mg, 650 mg, Per Tube, Q6H PRN, Tressie Ellis, RPH, 650 mg at 09/21/23 5621   arformoterol (BROVANA) nebulizer solution 15 mcg, 15 mcg, Nebulization, BID, Ezequiel Essex, NP, 15 mcg at 09/24/23 0811   [START ON 09/25/2023] ceFAZolin (ANCEF) IVPB 2g/100 mL premix, 2 g, Intravenous, Once, Alene Mires, NP   Chlorhexidine Gluconate Cloth 2 % PADS 6  each, 6 each, Topical, Daily, Marcelino Duster, MD, 6 each at 09/24/23 0830   diazepam (VALIUM) tablet 10 mg, 10 mg, Per Tube, Q12H, Harlon Ditty D, NP, 10 mg at 09/24/23 1027   docusate (COLACE) 50 MG/5ML liquid 100 mg, 100 mg, Per Tube, BID, Raechel Chute, MD, 100 mg at 09/24/23 0837   famotidine (PEPCID) tablet 20 mg, 20 mg, Per Tube, BID, Rust-Chester, Cecelia Byars, NP, 20 mg at 09/24/23 0838   feeding supplement (PROSource TF20) liquid 60 mL, 60 mL, Per Tube, TID, Assaker, West Bali, MD, 60 mL at 09/24/23 0837    feeding supplement (VITAL 1.5 CAL) liquid 1,000 mL, 1,000 mL, Per Tube, Continuous, Judithe Modest, NP, Stopped at 09/24/23 1000   free water 30 mL, 30 mL, Per Tube, Q4H, Dgayli, Khabib, MD, 30 mL at 09/24/23 0830   HYDROmorphone (DILAUDID) 50 mg in 50 mL NS (1mg /mL) premix infusion, 0.5-4 mg/hr, Intravenous, Continuous, Kasa, Wallis Bamberg, MD, Last Rate: 3 mL/hr at 09/24/23 1335, 3 mg/hr at 09/24/23 1335   HYDROmorphone (DILAUDID) bolus via infusion 0.25-2 mg, 0.25-2 mg, Intravenous, Q30 min PRN, Erin Fulling, MD, 2 mg at 09/24/23 0736   insulin aspart (novoLOG) injection 0-20 Units, 0-20 Units, Subcutaneous, Q4H, Rust-Chester, Britton L, NP, 3 Units at 09/24/23 1156   insulin aspart (novoLOG) injection 5 Units, 5 Units, Subcutaneous, Q4H, Rust-Chester, Britton L, NP, 5 Units at 09/24/23 1156   ipratropium-albuterol (DUONEB) 0.5-2.5 (3) MG/3ML nebulizer solution 3 mL, 3 mL, Nebulization, Q2H PRN, Aundria Rud, Khabib, MD, 3 mL at 09/14/23 1304   ipratropium-albuterol (DUONEB) 0.5-2.5 (3) MG/3ML nebulizer solution 3 mL, 3 mL, Nebulization, Q6H, Kasa, Kurian, MD, 3 mL at 09/24/23 1320   midazolam (VERSED) 100 mg/100 mL (1 mg/mL) premix infusion, 0.5-10 mg/hr, Intravenous, Continuous, Kasa, Kurian, MD, Last Rate: 2.5 mL/hr at 09/24/23 1335, 2.5 mg/hr at 09/24/23 1335   midazolam (VERSED) bolus via infusion 1-2 mg, 1-2 mg, Intravenous, Q2H PRN, Erin Fulling, MD   multivitamin with minerals tablet 1 tablet, 1 tablet, Per Tube, Daily, Tressie Ellis, RPH, 1 tablet at 09/24/23 2536   norepinephrine (LEVOPHED) 16 mg in (0.064 mg/mL) premix infusion, 0-40 mcg/min, Intravenous, Titrated, Rust-Chester, Micheline Rough L, NP, Last Rate: 1.88 mL/hr at 09/24/23 1332, 2 mcg/min at 09/24/23 1332   ondansetron (ZOFRAN) injection 4 mg, 4 mg, Intravenous, Q8H PRN, Lorretta Harp, MD   Oral care mouth rinse, 15 mL, Mouth Rinse, Q2H, Dgayli, Khabib, MD, 15 mL at 09/24/23 1334   Oral care mouth rinse, 15 mL, Mouth Rinse, PRN, Dgayli,  Khabib, MD   polyethylene glycol (MIRALAX / GLYCOLAX) packet 17 g, 17 g, Per Tube, Daily, Dgayli, Khabib, MD, 17 g at 09/20/23 0945   [COMPLETED] predniSONE (DELTASONE) tablet 30 mg, 30 mg, Per Tube, Q breakfast, 30 mg at 09/22/23 0809 **FOLLOWED BY** predniSONE (DELTASONE) tablet 20 mg, 20 mg, Per Tube, Q breakfast, 20 mg at 09/24/23 0838 **FOLLOWED BY** [START ON 09/26/2023] predniSONE (DELTASONE) tablet 10 mg, 10 mg, Per Tube, Q breakfast, Assaker, West Bali, MD   QUEtiapine (SEROQUEL) tablet 50 mg, 50 mg, Per Tube, BID, Assaker, Jean-Pierre, MD, 50 mg at 09/24/23 0838   sodium chloride flush (NS) 0.9 % injection 10-40 mL, 10-40 mL, Intracatheter, Q12H, Aleskerov, Fuad, MD, 10 mL at 09/24/23 0831   sodium chloride flush (NS) 0.9 % injection 10-40 mL, 10-40 mL, Intracatheter, PRN, Vida Rigger, MD, 10 mL at 09/13/23 2151   vecuronium (NORCURON) injection 20 mg, 20 mg, Intravenous, Q2H PRN, Erin Fulling, MD   ALLERGIES: No  Active Allergies   BIRTH HX:  Term - no complications.   SOCIAL HISTORY: Social History   Socioeconomic History   Marital status: Single    Spouse name: Not on file   Number of children: Not on file   Years of education: Not on file   Highest education level: Not on file  Occupational History   Not on file  Tobacco Use   Smoking status: Some Days    Current packs/day: 0.25    Average packs/day: 0.3 packs/day for 48.0 years (12.0 ttl pk-yrs)    Types: Cigarettes   Smokeless tobacco: Never  Vaping Use   Vaping status: Never Used  Substance and Sexual Activity   Alcohol use: Yes    Comment: occ   Drug use: Never   Sexual activity: Not on file  Other Topics Concern   Not on file  Social History Narrative   Not on file   Social Drivers of Health   Financial Resource Strain: Low Risk  (08/25/2023)   Received from [facility]   Overall Financial Resource Strain (CARDIA)    Difficulty of Paying Living Expenses: Not very hard  Recent  Concern: Financial Resource Strain - Medium Risk (07/23/2023)   Received from [facility]   Overall Financial Resource Strain (CARDIA)    Difficulty of Paying Living Expenses: Somewhat hard  Food Insecurity: No Food Insecurity (09/12/2023)   Hunger Vital Sign    Worried About Running Out of Food in the Last Year: Never true    Ran Out of Food in the Last Year: Never true  Recent Concern: Food Insecurity - Food Insecurity Present (08/25/2023)   Received from [facility]   Hunger Vital Sign    Worried About Running Out of Food in the Last Year: Often true    Ran Out of Food in the Last Year: Often true  Transportation Needs: No Transportation Needs (09/12/2023)   PRAPARE - Administrator, Civil Service (Medical): No    Lack of Transportation (Non-Medical): No  Recent Concern: Transportation Needs - Unmet Transportation Needs (08/25/2023)   Received from [facility] - Transportation    In the past 12 months, has lack of transportation kept you from medical appointments or from getting medications?: Yes    Lack of Transportation (Non-Medical): No  Physical Activity: Insufficiently Active (08/25/2023)   Received from [facility]   Exercise Vital Sign    Days of Exercise per Week: 1 day    Minutes of Exercise per Session: 60 min  Stress: No Stress Concern Present (08/25/2023)   Received from [facility] - Occupational Stress Questionnaire    Feeling of Stress : Not at all  Social Connections: Socially Isolated (08/25/2023)   Received from [facility]   Social Connection and Isolation Panel [NHANES]    Frequency of Communication with Friends and Family: Twice a week    Frequency of Social Gatherings with Friends and Family: Never    Attends Religious Services: Never    Database administrator or Organizations: No     Attends Banker Meetings: Never    Marital Status: Separated  Intimate Partner Violence: Not At Risk (09/12/2023)   Humiliation, Afraid, Rape, and Kick questionnaire    Fear of Current or Ex-Partner: No    Emotionally Abused: No    Physically Abused: No  Sexually Abused: No     FAMILY HISTORY: No family history on file.   PHYSICAL EXAM: BP (!) 84/64   Pulse (!) 116   Temp 98.5 F (36.9 C) (Axillary)   Resp 20   Ht 6' (1.829 m)   Wt 60.5 kg   SpO2 100%   BMI 18.09 kg/m   Constitutional - please see medical record for recorded vital signs.  The child appeared to be in no acute distress, with evidence of normal development, nutrition and body habitus.  There was no evidence of speech or language delays and the voice was normal.  Head and Face - inspection of the head and face revealed the scalp to be normal and skin without scars, lesions or masses.  There was no evidence of peri-orbital edema or erythema, or palpable sinus tenderness.  There was no evidence of salivary gland tenderness or enlargement.  Facial strength appeared intact and symmetric bilaterally.  Eyes - pupils were equal, round and reactive to light.  Extra-ocular movements were intact and vision was grossly normal bilaterally.  Ears - The external ears were normal in appearance.  Otoscopic exam revealed the external auditory canals to be patent.  The tympanic membranes were clear bilaterally, with normal mobility on pneumatic otoscopy.  There was no evidence of middle ear fluid, perforation, drainage or acute infection.  Hearing was grossly intact and speech reception thresholds were grossly within normal limits.  Nose - The external nose was normal in appearance.  The nasal dorsum was midline.  Exam of the anterior nasal cavity revealed no evidence of purulent drainage, polyps or mass or mucosal lesions.  The septum was midline and the inferior turbinate were normal in size and appearance.    Oral  cavity - The mucosa of the oral cavity and oropharynx was normal without mass or mucosal lesion, erythema or exudate.  The posterior pharyngeal wall, soft and hard palate and tongue all appeared normal.  There was no evidence of significant tonsillar hypertrophy.  Intubated with regular ETT.  Neck - The neck was nontender and without palpable adenopathy, crepitus or mass lesion.  The trachea was midline.  The thyroid exam revealed no evidence of enlargement, tenderness or mass lesion.  Repiratory - The patient was without respiratory distress, stridor or retractions.  Breath sounds were clear bilaterally.  Cardiovascular - Cardiovascular exam revealed a regular rate and rhythm with no evidence of murmur.  Extremeties without evidence of cyanosis, clubbing or edema.  Lymphatic System - there was no evidence of palpable adenopathy in the neck, supraclavicular fossae or axillae.  Neurologic - Cranial nerves II through XII were grossly intact bilaterally.  In particular the VII cranial nerve was intact and symmetric bilaterally.    IMAGING:   AUDIOLOGY:   Medical Decision Making  ASSESSMENT:  COPD exacerbation, now intubated and with failure to wean from ventilatory support.  Trach and PEG tube requested by the ICU team.    PLAN:  PEG will be handled by VIR.  Tracheotomy tentatively scheduled for this Friday, 09/26/23 at 7:15am.    I discussed the risks, benefits and options for the proposed procedure with the family at length today (patient's wife) - she understands and agree to proceed.  Risks discussed included bleeding, airway injury, need for reintubation, pneumothorax and possible risk of death.    Will plan on routine trach care postop and anticipate first trach change on approximately POD 5.    Discussed plan with ICU team today (NP) and appreciate  orders to make NPO after midnight on Thursday and a recheck of coags (PT/PTT) prior to surgery, perhaps sometime tomorrow.      Magnus Ivan. Okey Dupre, MD, MBA, [facility] Otolaryngology-Head & Neck Surgery [facility] [phone]

## 2023-09-24 NOTE — Plan of Care (Signed)
  Problem: Education: Goal: Knowledge of General Education information will improve Description: Including pain rating scale, medication(s)/side effects and non-pharmacologic comfort measures Outcome: Progressing   Problem: Health Behavior/Discharge Planning: Goal: Ability to manage health-related needs will improve Outcome: Progressing   Problem: Clinical Measurements: Goal: Ability to maintain clinical measurements within normal limits will improve Outcome: Progressing Goal: Will remain free from infection Outcome: Progressing Goal: Diagnostic test results will improve Outcome: Progressing Goal: Respiratory complications will improve Outcome: Progressing Goal: Cardiovascular complication will be avoided Outcome: Progressing   Problem: Activity: Goal: Risk for activity intolerance will decrease Outcome: Progressing   Problem: Nutrition: Goal: Adequate nutrition will be maintained Outcome: Progressing   Problem: Coping: Goal: Level of anxiety will decrease Outcome: Progressing   Problem: Elimination: Goal: Will not experience complications related to bowel motility Outcome: Progressing Goal: Will not experience complications related to urinary retention Outcome: Progressing   Problem: Pain Management: Goal: General experience of comfort will improve Outcome: Progressing   Problem: Safety: Goal: Ability to remain free from injury will improve Outcome: Progressing   Problem: Skin Integrity: Goal: Risk for impaired skin integrity will decrease Outcome: Progressing   Problem: Education: Goal: Knowledge of disease or condition will improve Outcome: Progressing Goal: Knowledge of the prescribed therapeutic regimen will improve Outcome: Progressing   Problem: Activity: Goal: Ability to tolerate increased activity will improve Outcome: Progressing Goal: Will verbalize the importance of balancing activity with adequate rest periods Outcome: Progressing   Problem:  Respiratory: Goal: Ability to maintain a clear airway will improve Outcome: Progressing Goal: Levels of oxygenation will improve Outcome: Progressing Goal: Ability to maintain adequate ventilation will improve Outcome: Progressing   Problem: Education: Goal: Ability to describe self-care measures that may prevent or decrease complications (Diabetes Survival Skills Education) will improve Outcome: Progressing   Problem: Coping: Goal: Ability to adjust to condition or change in health will improve Outcome: Progressing   Problem: Fluid Volume: Goal: Ability to maintain a balanced intake and output will improve Outcome: Progressing   Problem: Health Behavior/Discharge Planning: Goal: Ability to identify and utilize available resources and services will improve Outcome: Progressing Goal: Ability to manage health-related needs will improve Outcome: Progressing   Problem: Metabolic: Goal: Ability to maintain appropriate glucose levels will improve Outcome: Progressing   Problem: Nutritional: Goal: Maintenance of adequate nutrition will improve Outcome: Progressing Goal: Progress toward achieving an optimal weight will improve Outcome: Progressing   Problem: Skin Integrity: Goal: Risk for impaired skin integrity will decrease Outcome: Progressing   Problem: Tissue Perfusion: Goal: Adequacy of tissue perfusion will improve Outcome: Progressing   Problem: Activity: Goal: Ability to tolerate increased activity will improve Outcome: Progressing   Problem: Respiratory: Goal: Ability to maintain a clear airway and adequate ventilation will improve Outcome: Progressing   Problem: Role Relationship: Goal: Method of communication will improve Outcome: Progressing

## 2023-09-24 NOTE — Progress Notes (Signed)
Discussed with Britton-Lee, NP about giving patient PM medications. Instructed to give all medications up until Midnight and then keep NPO for procedure. Also addressed no SCD order and she placed that order with AM lab works. Will carry out all medication administration and place SCDs. Will continue to monitor.

## 2023-09-24 NOTE — Progress Notes (Signed)
                                                     Palliative Care Progress Note, Assessment & Plan   Patient Name: Benjamin Bolton       Date: 09/24/2023 DOB: Oct 10, 1960  Age: 63 y.o. MRN#: [id number] Attending Physician: Erin Fulling, MD Primary Care Physician: [facility] Admit Date: 09/10/2023  Subjective: Patient is lying in bed, on ventilator and sedation.  No family or friends present during my visit.  HPI: 63 y.o. male  with past medical history of 2 diabetes, GERD, HLD, tobacco use disorder, COPD stage IV (2-3L [place] at home), DVT (Eliquis) admitted on 09/10/2023 with acute on chronic hypoxic and hypercapnic respiratory failure in the setting of AECOPD with RSV pneumonia.   1/11, rapid response, transferred to ICU, patient was intubated and sedated..  1/12, remains vented, sedated, does not follow commands.  Failed SBT  1/13, overnight due to concerns of aspiration TF's held.  Will repeat SBT today 1/14, overnight pt had possible seizure activity with rhythmic tremors noted in the mouth/lower face/neck during episode pt unresponsive to pain.  Received 2 mg of versed and 2g keppra bolus with resolution of symptoms.  CT Head negative.  Standing dose of keppra 1g bid.  Neurology consulted.    1/15, on minimal vent settings, unable to tolerate WUA due to increased WOB. Shifting measures given for Hyperkalemia of 5.5 with peaked T waves.  Abdomen distended, KUB without evidence of obstruction, give Lactulose.  Diurese with 40 mg IV Lasix x1. 1/16, Failed WUA, with increased WOB and hypoxic with O2 sats dropping to mid 70's.  Add Seroquel.   1/17-1/19 failed WUA/SBT 1/19 concern for recurrent sepsis due to fevers developing overnight and increased vasopressor support   Palliative Care consulted to assist with GOC.  Summary of counseling/coordination  of care: Extensive chart review completed prior to meeting patient including labs, vital signs, imaging, progress notes, orders, and available advanced directive documents from current and previous encounters.   After reviewing the patient's chart, I assessed the patient at bedside.  No significant change since my last assessment.  Plan remains for patient to receive tracheostomy and PEG tube placement. Trach placement planned for today.   PMT will continue to monitor the patient peripherally.  We will continue to support patient and family throughout his hospitalization and reengage when appropriate.  Physical Exam Vitals reviewed.  Constitutional:      Appearance: He is ill-appearing.  HENT:     Head: Normocephalic.  Cardiovascular:     Rate and Rhythm: Normal rate.  Pulmonary:     Comments: Mechanical vent support Musculoskeletal:     Comments: Bilateral UE edema   Skin:    General: Skin is warm and dry.             Total Time 25 minutes   Time spent includes: Detailed review of medical records (labs, imaging, vital signs), medically appropriate exam (mental status, respiratory, cardiac, skin), discussed with treatment team, counseling and educating patient, family and staff, documenting clinical information, medication management and coordination of care.  Samara Deist L. Bonita Quin, DNP, FNP-BC Palliative Medicine Team

## 2023-09-24 NOTE — Progress Notes (Signed)
NAME:  Benjamin Bolton, MRN:  [id number], DOB:  11-17-1960, LOS: 14 ADMISSION DATE:  09/10/2023, CONSULTATION DATE: 09/13/2023  Brief Pt Description / Synopsis:  63 y.o. 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   History of Present Illness:  Benjamin Bolton is a 63 year old male with a history of severe COPD followed by [facility] who presents to the hospital from [facility] clinic on 1/8 with increased shortness of breath. He was admitted to the medicine service for management of COPD exacerbation and found to have an RSV infection.   Patient was admitted to [facility] and initiated on nebulizers, steroids, and antibiotics. He was managed medically for COPD exacerbation and UTI but developed worsening respiratory distress this AM requiring the initiation of BiPAP. Rapid response was called and he was transferred to the ICU. Initial blood gas showed pH of 7.2, with repeat VBG showing he remained acidemic at 7.18. Given altered mental status and respiratory failure, decision made at the bedside to proceed with emergent intubation.  Pertinent  Medical History  DVT on Eliquis COPD on 2L O2 Type II Diabetes Mellitus  GERD HLD Pulmonary Nodule Tobacco Abuse   Micro Data:  1/8: SARS-CoV-2 PCR>>negative 1/8: Blood culture x2>> no growth 1/8: Urine>> multiple species (suggest recollection) 1/9: RVP>> + RSV 1/9: Sputum>> normal respiratory flora 1/9: HIV Screen>> non reactive 1/11: MRSA PCR>> negative 1/19: MRSA PCR>> 1/19: RVP>>  Antimicrobials:   Anti-infectives (From admission, onward)    Start     Dose/Rate Route Frequency Ordered Stop   09/25/23 0000  ceFAZolin (ANCEF) IVPB 2g/100 mL premix        2 g 200 mL/hr over 30 Minutes Intravenous  Once 09/23/23 1103     09/22/23 0010   vancomycin (VANCOCIN) IVPB 750 mg/150 ml premix  Status:  Discontinued        750 mg 150 mL/hr over 60 Minutes Intravenous Every 12 hours 09/22/23 0004 09/22/23 1039   09/22/23 0000  Vancomycin (VANCOCIN) 750 mg IVPB  Status:  Discontinued        750 mg 150 mL/hr over 60 Minutes Intravenous Every 12 hours 09/21/23 1120 09/21/23 2356   09/22/23 0000  vancomycin (VANCOREADY) IVPB 750 mg/150 mL  Status:  Discontinued        750 mg 150 mL/hr over 60 Minutes Intravenous Every 12 hours 09/21/23 2356 09/22/23 0004   09/21/23 1300  vancomycin (VANCOCIN) IVPB 1000 mg/200 mL premix        1,000 mg 200 mL/hr over 60 Minutes Intravenous  Once 09/21/23 1120 09/21/23 1900   09/21/23 1215  piperacillin-tazobactam (ZOSYN) IVPB 3.375 g  Status:  Discontinued        3.375 g 12.5 mL/hr over 240 Minutes Intravenous Every 8 hours 09/21/23 1120 09/23/23 1257   09/21/23 1130  vancomycin (VANCOREADY) IVPB 1250 mg/250 mL  Status:  Discontinued        1,250 mg 166.7 mL/hr over 90 Minutes Intravenous  Once 09/21/23 1046 09/21/23 1120   09/21/23 1100  piperacillin-tazobactam (ZOSYN) IVPB 3.375 g  Status:  Discontinued        3.375 g 12.5 mL/hr over 240 Minutes Intravenous  Once 09/21/23 1046 09/21/23 1152   09/13/23 1445  piperacillin-tazobactam (ZOSYN) IVPB 3.375 g        3.375 g 12.5 mL/hr over  240 Minutes Intravenous Every 8 hours 09/13/23 1351 09/17/23 0157   09/10/23 2315  cefTRIAXone (ROCEPHIN) 1 g in sodium chloride 0.9 % 100 mL IVPB  Status:  Discontinued        1 g 200 mL/hr over 30 Minutes Intravenous Every 24 hours 09/10/23 2305 09/13/23 1336      Significant Hospital Events: Including procedures, antibiotic start and stop dates in addition to other pertinent events   1/08: admit to [facility] 1/11: rapid response, transfer to ICU, intubated 1/12: remains vented, sedated, does not follow commands.  Failed SBT  1/13: Overnight due to concerns of aspiration TF's held.  Will repeat SBT today 1/14: Overnight  pt had possible seizure activity with rhythmic tremors noted in the mouth/lower face/neck during episode pt unresponsive to pain.  Received 2 mg of versed and 2g keppra bolus with resolution of symptoms.  CT Head negative.  Standing dose of keppra 1g bid.  Neurology consulted.    1/15: On minimal vent settings, unable to tolerate WUA due to increased WOB. Shifting measures given for Hyperkalemia of 5.5 with peaked T waves.  Abdomen distended, KUB without evidence of obstruction, give Lactulose.  Diurese with 40 mg IV Lasix x1. 1/16: Failed WUA, with increased WOB and hypoxic with O2 sats dropping to mid 70's.  Add Seroquel.  Palliative Care consulted to assist with GOC. 1/17: Precedex started for WUA.  Failed SBT due to increased WOB, tachypnea, and hypoxia.  Increase Seroquel to 50 mg BID. Transition Heparin to Elqiuis 1/18: Pt remains mechanically intubated and has failed multiple SBT's.  Planning for family meeting today at bedside with palliative care and PCCM will perform SBT once family arrives at bedside.  Remains sedated with propofol and fentanyl gtts.  Family meeting held code status changed to DNR per family request.  Pts family trying to decide if they want to proceed with tracheostomy.  Required additional sedation and prn vecuronium overnight due to vent dyssynchrony  1/19: Pt febrile overnight with increased vasopressor requirements levophed gtt @12  mcg/min and febrile tmax 101 degrees F concerning for recurrent sepsis.  Broad spectrum abx started. Pt remains mechanically intubated and unable to successfully liberate from ventilator  1/20 remains on vent, severe rap failure 1/21 remains on vent prolonged exp phase air trapping  on vent  Interim History / Subjective:  Remains critically ill Remains intubated Severe hypoxia Requires VENT support for survival  Severe end stage COPD Failure to wean from vent Family has consent for TRACH/PEG tube ENT and IR consulted    Objective    Blood pressure 101/67, pulse (!) 115, temperature 100.1 F (37.8 C), temperature source Oral, resp. rate 14, height 6' (1.829 m), weight 60.5 kg, SpO2 93%.    Vent Mode: PRVC FiO2 (%):  [30 %] 30 % Set Rate:  [12 bmp] 12 bmp Vt Set:  [570 mL] 570 mL PEEP:  [8 cmH20] 8 cmH20 Plateau Pressure:  [15 cmH20-29 cmH20] 24 cmH20   Intake/Output Summary (Last 24 hours) at 09/24/2023 0745 Last data filed at 09/24/2023 0960 Gross per 24 hour  Intake 2924.08 ml  Output 1300 ml  Net 1624.08 ml   Filed Weights   09/22/23 0400 09/23/23 0414 09/24/23 0451  Weight: 61.1 kg 62.2 kg 60.5 kg      REVIEW OF SYSTEMS  PATIENT IS UNABLE TO PROVIDE COMPLETE REVIEW OF SYSTEMS DUE TO SEVERE CRITICAL ILLNESS   PHYSICAL EXAMINATION:  GENERAL:critically ill appearing, +resp distress EYES: Pupils equal, round, reactive to light.  No scleral icterus.  MOUTH: Moist mucosal membrane. INTUBATED NECK: Supple.  PULMONARY: Lungs clear to auscultation, +rhonchi, +wheezing CARDIOVASCULAR: S1 and S2.  Regular rate and rhythm GASTROINTESTINAL: Soft, nontender, -distended. Positive bowel sounds.  MUSCULOSKELETAL: No swelling, clubbing, or edema.  NEUROLOGIC: obtunded,sedated SKIN:normal, warm to touch, Capillary refill delayed  Pulses present bilaterally      Assessment & Plan:  63 yo AAM with Acute  metabolic encephalopathy due to severe hypoxic and hypercapnic resp failure due to severe COPD exacerbation with sepsis and RSV pneumonia complicated by demand ischemia  Severe ACUTE Hypoxic and Hypercapnic Respiratory Failure -continue Mechanical Ventilator support -Wean Fio2 and PEEP as tolerated -VAP/VENT bundle implementation - Wean PEEP & FiO2 as tolerated, maintain SpO2 > 88% - Head of bed elevated 30 degrees, VAP protocol in place - Plateau pressures less than 30 cm H20  - Intermittent chest x-ray & ABG PRN - Ensure adequate pulmonary hygiene  Failure to wean from vent Needs TRACH to  survive   SEVERE COPD EXACERBATION -continue IV steroids as prescribed  INFECTIOUS DISEASE -continue antibiotics as prescribed -follow up cultures   NEUROLOGY ACUTE METABOLIC ENCEPHALOPATHY Mechanically intubation pain/discomfort  Possible seizure activity~very low suspicion   - Continue thiamine and mvi  - Keppra was discontinued - Seizure precautions  - Continue seroquel to 50 mg BID to assist with WUA   CARDIAC  Mildly elevated troponin secondary to demand ischemia  Hypotension suspect secondary to possible sepsis and sedating medication  HOLD ALL ATIPLT AND AC therapy for prcodeures Hx: HLD and DVT on Eliquis Echo 09/14/23: 50 to 55%, indeterminate diastolic parameters - Cardiology signed off no further cardiac evaluations indicated at this time    ACUTE ANEMIA- TRANSFUSE AS NEEDED CONSIDER TRANSFUSION  IF HGB<7   ENDO - ICU hypoglycemic\Hyperglycemia protocol -check FSBS per protocol  GI GI PROPHYLAXIS as indicated  NUTRITIONAL STATUS DIET-->TF's as tolerated Constipation protocol as indicated   ELECTROLYTES -follow labs as needed -replace as needed -pharmacy consultation and following    Pt is critically ill with AECOPD in the setting of RSV and pneumonia.  Anticipate difficulty in liberating from mechanical ventilation.  May end up needing Tracheostomy.  Scheduled meeting on Monday 09/22/23 at 1300  Best Practice (right click and "Reselect all SmartList Selections" daily)   Diet/type: TF's DVT prophylaxis Apixaban  Pressure ulcer(s): N/A GI prophylaxis: Pepcid  Lines: RUE PICC Line and still needed  Foley:  External Catheter  Code Status: DNR Last date of multidisciplinary goals of care discussion [09/22/2023]  01/20: proceed with trach and PEG tube Labs   CBC: Recent Labs  Lab 09/20/23 0406 09/21/23 0333 09/22/23 0400 09/23/23 0519 09/24/23 0418  WBC 9.4 9.1 8.6 8.5 13.2*  HGB 9.8* 9.6* 9.0* 9.1* 9.1*  HCT 30.6* 30.0* 27.6* 29.3*  30.3*  MCV 93.9 97.7 94.8 98.0 101.7*  PLT 241 309 265 294 326    Basic Metabolic Panel: Recent Labs  Lab 09/18/23 0155 09/18/23 0446 09/19/23 0556 09/20/23 0406 09/21/23 0333 09/22/23 0400 09/23/23 0519 09/24/23 0418  NA  --  130* 133* 133* 133* 136 138 139  K  --  4.8 4.8 4.1 4.1 4.1 3.8 4.9  CL  --  87* 89* 92* 91* 95* 92* 93*  CO2  --  35* 34* 33* 32 34* 36* 37*  GLUCOSE  --  120* 107* 183* 169* 142* 152* 209*  BUN  --  25* 23 26* 30* 26* 31* 34*  CREATININE  --  0.52* 0.62 0.53* 0.51*  0.45* 0.60* 0.45*  CALCIUM  --  9.0 8.8* 8.8* 8.4* 8.4* 8.6* 8.9  MG 2.4  --  2.2  --   --  2.1  --  2.2  PHOS  --  2.9 3.0 3.0 3.2 2.6  --   --    GFR: Estimated Creatinine Clearance: 83 mL/min (A) (by C-G formula based on SCr of 0.45 mg/dL (L)). Recent Labs  Lab 09/21/23 0333 09/22/23 0400 09/23/23 0519 09/24/23 0418  PROCALCITON 1.55  --   --   --   WBC 9.1 8.6 8.5 13.2*  LATICACIDVEN 1.0  --   --   --     Liver Function Tests: Recent Labs  Lab 09/18/23 0446 09/19/23 0556 09/20/23 0406 09/21/23 0333 09/22/23 0400  ALBUMIN 2.6* 2.4* 2.5* 2.5* 2.2*   No results for input(s): "LIPASE", "AMYLASE" in the last 168 hours. No results for input(s): "AMMONIA" in the last 168 hours.  ABG    Component Value Date/Time   PHART 7.43 09/20/2023 2332   PCO2ART 62 (H) 09/20/2023 2332   PO2ART 53 (L) 09/20/2023 2332   HCO3 41.2 (H) 09/20/2023 2332   O2SAT 86.6 09/20/2023 2332     Coagulation Profile: Recent Labs  Lab 09/22/23 1427  INR 1.1     Cardiac Enzymes: No results for input(s): "CKTOTAL", "CKMB", "CKMBINDEX", "TROPONINI" in the last 168 hours.  HbA1C: Hgb A1c MFr Bld  Date/Time Value Ref Range Status  09/15/2023 09:24 PM 6.4 (H) 4.8 - 5.6 % Final    Comment:    (NOTE) Pre diabetes:          5.7%-6.4%  Diabetes:              >6.4%  Glycemic control for   <7.0% adults with diabetes     CBG: Recent Labs  Lab 09/23/23 1130 09/23/23 1616 09/23/23 1947  09/23/23 2341 09/24/23 0413  GLUCAP 191* 241* 149* 105* 156*    Review of Systems:   Unable to assess pt mechanically intubated   Past Medical History:  He,  has a past medical history of COPD (chronic obstructive pulmonary disease) (HCC), DVT (deep venous thrombosis) (HCC), Dyspnea, GERD (gastroesophageal reflux disease), HLD (hyperlipidemia), migraines, Oxygen dependent, Pneumonia (2023), Pre-diabetes, Pulmonary nodule, Rotator cuff tear, right, Tobacco abuse, and Tobacco use.   Surgical History:   Past Surgical History:  Procedure Laterality Date   CHEST TUBE INSERTION     SHOULDER ARTHROSCOPY WITH SUBACROMIAL DECOMPRESSION, ROTATOR CUFF REPAIR AND BICEP TENDON REPAIR Right 02/18/2023   Procedure: RIGHT SHOULDER ARTHROSCOPY WITH DEBRIDEMENT, DECOMPRESSION, ROTATOR CUFF REPAIR, BICEPS TENODESIS.;  Surgeon: Christena Flake, MD;  Location: [facility] ORS;  Service: Orthopedics;  Laterality: Right;     Social History:   reports that he has been smoking cigarettes. He has a 12 pack-year smoking history. He has never used smokeless tobacco. He reports current alcohol use. He reports that he does not use drugs.   Family History:  His family history is not on file.   Allergies No Active Allergies   Home Medications  Prior to Admission medications   Medication Sig Start Date End Date Taking? Authorizing Provider  albuterol (PROVENTIL) (2.5 MG/3ML) 0.083% nebulizer solution USE 1 VIAL IN NEBULIZER EVERY 6 HOURS AS NEEDED FOR WHEEZING 06/28/21  Yes [provider]  albuterol (VENTOLIN HFA) 108 (90 Base) MCG/ACT inhaler Inhale 1 puff into the lungs every 4 (four) hours as needed for wheezing or shortness of breath.   Yes [provider]  APIXABAN (  ELIQUIS) VTE STARTER PACK (10MG  AND 5MG ) Take as directed on package: start with two-5mg  tablets twice daily for 7 days. On day 8, switch to one-5mg  tablet twice daily. 02/26/23  Yes Merwyn Katos, MD  ondansetron (ZOFRAN) 4 MG tablet  Take 1 tablet (4 mg total) by mouth every 6 (six) hours as needed for nausea. 02/19/23  Yes Anson Oregon, PA-C  oxyCODONE (OXY IR/ROXICODONE) 5 MG immediate release tablet Take 1 tablet by mouth 2 (two) times daily as needed. 08/29/23  Yes [provider]  OXYGEN Inhale 3 L into the lungs continuous.   Yes [provider]  predniSONE (DELTASONE) 10 MG tablet Take 2-6 tablets by mouth daily with breakfast.  ORIGINAL EXB:MWUX 6 tabs daily x4 days; then 4 tabs daily x4 days; then 2 tabs daily x4 days.   Yes [provider]  predniSONE (DELTASONE) 20 MG tablet Take 20 mg by mouth daily with breakfast.   Yes [provider]  TRELEGY ELLIPTA 100-62.5-25 MCG/ACT AEPB Inhale 1 puff into the lungs every morning. 07/04/21  Yes [provider]  amoxicillin-clavulanate (AUGMENTIN) 875-125 MG tablet Take 1 tablet by mouth every 12 (twelve) hours. Patient not taking: Reported on 09/10/2023    [provider]  benralizumab (FASENRA PEN) 30 MG/ML prefilled autoinjector Inject 30 mg into the skin as directed. Patient not taking: Reported on 09/10/2023 07/23/23   [provider]  nystatin (MYCOSTATIN) 100000 UNIT/ML suspension Take 5 mLs by mouth 4 (four) times daily. Patient not taking: Reported on 09/10/2023 09/04/23   [provider]  omeprazole (PRILOSEC) 20 MG capsule Take 20 mg by mouth daily. Patient not taking: Reported on 09/10/2023    [provider]  oxyCODONE (OXY IR/ROXICODONE) 5 MG immediate release tablet Take 1-2 tablets (5-10 mg total) by mouth every 4 (four) hours as needed for severe pain. 02/19/23   Anson Oregon, PA-C       DVT/GI PRX  assessed I Assessed the need for Labs I Assessed the need for Foley I Assessed the need for Central Venous Line Family Discussion when available I Assessed the need for Mobilization I made an Assessment of medications to be adjusted accordingly Safety Risk assessment  completed  CASE DISCUSSED IN MULTIDISCIPLINARY ROUNDS WITH ICU TEAM     Critical Care Time devoted to patient care services described in this note is 45 minutes.  Critical care was necessary to treat /prevent imminent and life-threatening deterioration. Overall, patient is critically ill, prognosis is guarded.  Patient with Multiorgan failure and at high risk for cardiac arrest and death.    Lucie Leather, M.D.  [facility]  Medical Director [facility] Medical Director [facility] Cardio-Pulmonary Department

## 2023-09-24 NOTE — Plan of Care (Signed)
  Problem: Clinical Measurements: Goal: Ability to maintain clinical measurements within normal limits will improve Outcome: Not Progressing Goal: Diagnostic test results will improve Outcome: Not Progressing Goal: Respiratory complications will improve Outcome: Not Progressing   Problem: Nutrition: Goal: Adequate nutrition will be maintained Outcome: Not Progressing   Problem: Coping: Goal: Level of anxiety will decrease Outcome: Not Progressing   Problem: Skin Integrity: Goal: Risk for impaired skin integrity will decrease Outcome: Not Progressing

## 2023-09-24 NOTE — Interval H&P Note (Signed)
History and Physical Interval Note:  09/24/2023 4:18 PM  Benjamin Bolton  has presented today for surgery, with the diagnosis of COPD, RSV pneumonia.  The various methods of treatment have been discussed with the patient and family. After consideration of risks, benefits and other options for treatment, the patient has consented to  Procedure(s): TRACHEOSTOMY (N/A) as a surgical intervention.  The patient's history has been reviewed, patient examined, no change in status, stable for surgery.  I have reviewed the patient's chart and labs.  Questions were answered to the patient's satisfaction.    Patient preop for Tracheotomy on 09/26/23.  I discussed the risks, benefits and options for the proposed procedure with the family at length today - they understand and agree to proceed. Exam today revealed normal cervical findings and landmarks.    Chest - clear to auscultation bilaterally; ventilatory support C/V - regular rate and rhythm without murmur    Consent signed and on chart.  Reola Mosher S  No changes to H&P.   Magnus Ivan. Okey Dupre, MD, MBA, [facility] [place] [facility] [phone]

## 2023-09-24 NOTE — H&P (View-Only) (Signed)
Inpatient Consult Note  Attending:   Magnus Ivan. Okey Dupre, MD, MBA, FARS   Otolaryngology-Head & Neck Surgery  This patient was seen today in at the request of No referring provider defined for this encounter. in regard to  Chief Complaint  Patient presents with   Shortness of Breath     CHIEF COMPLAINT:   Chief Complaint  Patient presents with   Shortness of Breath     HPI:  The patient is a 63 y.o. old child who presents today with complaint of  Chief Complaint  Patient presents with   Shortness of Breath    63 y.o. 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   Mr. Corwell is followed by [facility]ology who presents to the hospital from [facility] clinic on 1/8 with increased shortness of breath. He was admitted to the medicine service for management of COPD exacerbation and found to have an RSV infection.   Patient was admitted to [facility] and initiated on nebulizers, steroids, and antibiotics. He was managed medically for COPD exacerbation and UTI but developed worsening respiratory distress this AM requiring the initiation of BiPAP. Rapid response was called and he was transferred to the ICU. Initial blood gas showed pH of 7.2, with repeat VBG showing he remained acidemic at 7.18. Given altered mental status and respiratory failure, decision made at the bedside to proceed with emergent intubation.  Asked to see by Dr. Belia Heman this past Monday, 09/24/23, for failure to wean from ventilatory support and possible tracheotomy placement.  The ICU team has also requested a PEG tube.  He is on Eliquis, but this has been held and coags will be checked later in the week prior to surgery per the ICU team.   REVIEW OF SYSTEMS: The patient / family denies any recent history of fever, night sweats  or weight loss, pain, cyanosis, clubbing or edema, respiratory distress, dizziness or imbalance.   PAST MEDICAL HISTORY: Past Medical History:  Diagnosis Date   COPD (chronic obstructive pulmonary disease) (HCC)    DVT (deep venous thrombosis) (HCC)    Dyspnea    GERD (gastroesophageal reflux disease)    no meds   HLD (hyperlipidemia)    Hx of migraines    Oxygen dependent    3 L [place]   Pneumonia 2023   Pre-diabetes    Pulmonary nodule    Rotator cuff tear, right    Tobacco abuse    Tobacco use       SURGICAL HISTORY: Past Surgical History:  Procedure Laterality Date   CHEST TUBE INSERTION     SHOULDER ARTHROSCOPY WITH SUBACROMIAL DECOMPRESSION, ROTATOR CUFF REPAIR AND BICEP TENDON REPAIR Right 02/18/2023   Procedure: RIGHT SHOULDER ARTHROSCOPY WITH DEBRIDEMENT, DECOMPRESSION, ROTATOR CUFF REPAIR, BICEPS TENODESIS.;  Surgeon: Christena Flake, MD;  Location: [facility] ORS;  Service: Orthopedics;  Laterality: Right;     MEDICATIONS:  Current Facility-Administered Medications:    acetaminophen (TYLENOL) tablet 650 mg, 650 mg, Per Tube, Q6H PRN, Tressie Ellis, RPH, 650 mg at 09/21/23 5621   arformoterol (BROVANA) nebulizer solution 15 mcg, 15 mcg, Nebulization, BID, Ezequiel Essex, NP, 15 mcg at 09/24/23 0811   [START ON 09/25/2023] ceFAZolin (ANCEF) IVPB 2g/100 mL premix, 2 g, Intravenous, Once, Alene Mires, NP   Chlorhexidine Gluconate Cloth 2 % PADS 6  each, 6 each, Topical, Daily, Marcelino Duster, MD, 6 each at 09/24/23 0830   diazepam (VALIUM) tablet 10 mg, 10 mg, Per Tube, Q12H, Harlon Ditty D, NP, 10 mg at 09/24/23 1027   docusate (COLACE) 50 MG/5ML liquid 100 mg, 100 mg, Per Tube, BID, Raechel Chute, MD, 100 mg at 09/24/23 0837   famotidine (PEPCID) tablet 20 mg, 20 mg, Per Tube, BID, Rust-Chester, Cecelia Byars, NP, 20 mg at 09/24/23 0838   feeding supplement (PROSource TF20) liquid 60 mL, 60 mL, Per Tube, TID, Assaker, West Bali, MD, 60 mL at 09/24/23 0837    feeding supplement (VITAL 1.5 CAL) liquid 1,000 mL, 1,000 mL, Per Tube, Continuous, Judithe Modest, NP, Stopped at 09/24/23 1000   free water 30 mL, 30 mL, Per Tube, Q4H, Dgayli, Khabib, MD, 30 mL at 09/24/23 0830   HYDROmorphone (DILAUDID) 50 mg in 50 mL NS (1mg /mL) premix infusion, 0.5-4 mg/hr, Intravenous, Continuous, Kasa, Wallis Bamberg, MD, Last Rate: 3 mL/hr at 09/24/23 1335, 3 mg/hr at 09/24/23 1335   HYDROmorphone (DILAUDID) bolus via infusion 0.25-2 mg, 0.25-2 mg, Intravenous, Q30 min PRN, Erin Fulling, MD, 2 mg at 09/24/23 0736   insulin aspart (novoLOG) injection 0-20 Units, 0-20 Units, Subcutaneous, Q4H, Rust-Chester, Britton L, NP, 3 Units at 09/24/23 1156   insulin aspart (novoLOG) injection 5 Units, 5 Units, Subcutaneous, Q4H, Rust-Chester, Britton L, NP, 5 Units at 09/24/23 1156   ipratropium-albuterol (DUONEB) 0.5-2.5 (3) MG/3ML nebulizer solution 3 mL, 3 mL, Nebulization, Q2H PRN, Aundria Rud, Khabib, MD, 3 mL at 09/14/23 1304   ipratropium-albuterol (DUONEB) 0.5-2.5 (3) MG/3ML nebulizer solution 3 mL, 3 mL, Nebulization, Q6H, Kasa, Kurian, MD, 3 mL at 09/24/23 1320   midazolam (VERSED) 100 mg/100 mL (1 mg/mL) premix infusion, 0.5-10 mg/hr, Intravenous, Continuous, Kasa, Kurian, MD, Last Rate: 2.5 mL/hr at 09/24/23 1335, 2.5 mg/hr at 09/24/23 1335   midazolam (VERSED) bolus via infusion 1-2 mg, 1-2 mg, Intravenous, Q2H PRN, Erin Fulling, MD   multivitamin with minerals tablet 1 tablet, 1 tablet, Per Tube, Daily, Tressie Ellis, RPH, 1 tablet at 09/24/23 2536   norepinephrine (LEVOPHED) 16 mg in (0.064 mg/mL) premix infusion, 0-40 mcg/min, Intravenous, Titrated, Rust-Chester, Micheline Rough L, NP, Last Rate: 1.88 mL/hr at 09/24/23 1332, 2 mcg/min at 09/24/23 1332   ondansetron (ZOFRAN) injection 4 mg, 4 mg, Intravenous, Q8H PRN, Lorretta Harp, MD   Oral care mouth rinse, 15 mL, Mouth Rinse, Q2H, Dgayli, Khabib, MD, 15 mL at 09/24/23 1334   Oral care mouth rinse, 15 mL, Mouth Rinse, PRN, Dgayli,  Khabib, MD   polyethylene glycol (MIRALAX / GLYCOLAX) packet 17 g, 17 g, Per Tube, Daily, Dgayli, Khabib, MD, 17 g at 09/20/23 0945   [COMPLETED] predniSONE (DELTASONE) tablet 30 mg, 30 mg, Per Tube, Q breakfast, 30 mg at 09/22/23 0809 **FOLLOWED BY** predniSONE (DELTASONE) tablet 20 mg, 20 mg, Per Tube, Q breakfast, 20 mg at 09/24/23 0838 **FOLLOWED BY** [START ON 09/26/2023] predniSONE (DELTASONE) tablet 10 mg, 10 mg, Per Tube, Q breakfast, Assaker, West Bali, MD   QUEtiapine (SEROQUEL) tablet 50 mg, 50 mg, Per Tube, BID, Assaker, Jean-Pierre, MD, 50 mg at 09/24/23 0838   sodium chloride flush (NS) 0.9 % injection 10-40 mL, 10-40 mL, Intracatheter, Q12H, Aleskerov, Fuad, MD, 10 mL at 09/24/23 0831   sodium chloride flush (NS) 0.9 % injection 10-40 mL, 10-40 mL, Intracatheter, PRN, Vida Rigger, MD, 10 mL at 09/13/23 2151   vecuronium (NORCURON) injection 20 mg, 20 mg, Intravenous, Q2H PRN, Erin Fulling, MD   ALLERGIES: No  Active Allergies   BIRTH HX:  Term - no complications.   SOCIAL HISTORY: Social History   Socioeconomic History   Marital status: Single    Spouse name: Not on file   Number of children: Not on file   Years of education: Not on file   Highest education level: Not on file  Occupational History   Not on file  Tobacco Use   Smoking status: Some Days    Current packs/day: 0.25    Average packs/day: 0.3 packs/day for 48.0 years (12.0 ttl pk-yrs)    Types: Cigarettes   Smokeless tobacco: Never  Vaping Use   Vaping status: Never Used  Substance and Sexual Activity   Alcohol use: Yes    Comment: occ   Drug use: Never   Sexual activity: Not on file  Other Topics Concern   Not on file  Social History Narrative   Not on file   Social Drivers of Health   Financial Resource Strain: Low Risk  (08/25/2023)   Received from [facility]   Overall Financial Resource Strain (CARDIA)    Difficulty of Paying Living Expenses: Not very hard  Recent  Concern: Financial Resource Strain - Medium Risk (07/23/2023)   Received from [facility]   Overall Financial Resource Strain (CARDIA)    Difficulty of Paying Living Expenses: Somewhat hard  Food Insecurity: No Food Insecurity (09/12/2023)   Hunger Vital Sign    Worried About Running Out of Food in the Last Year: Never true    Ran Out of Food in the Last Year: Never true  Recent Concern: Food Insecurity - Food Insecurity Present (08/25/2023)   Received from [facility]   Hunger Vital Sign    Worried About Running Out of Food in the Last Year: Often true    Ran Out of Food in the Last Year: Often true  Transportation Needs: No Transportation Needs (09/12/2023)   PRAPARE - Administrator, Civil Service (Medical): No    Lack of Transportation (Non-Medical): No  Recent Concern: Transportation Needs - Unmet Transportation Needs (08/25/2023)   Received from [facility] - Transportation    In the past 12 months, has lack of transportation kept you from medical appointments or from getting medications?: Yes    Lack of Transportation (Non-Medical): No  Physical Activity: Insufficiently Active (08/25/2023)   Received from [facility]   Exercise Vital Sign    Days of Exercise per Week: 1 day    Minutes of Exercise per Session: 60 min  Stress: No Stress Concern Present (08/25/2023)   Received from [facility] - Occupational Stress Questionnaire    Feeling of Stress : Not at all  Social Connections: Socially Isolated (08/25/2023)   Received from [facility]   Social Connection and Isolation Panel [NHANES]    Frequency of Communication with Friends and Family: Twice a week    Frequency of Social Gatherings with Friends and Family: Never    Attends Religious Services: Never    Database administrator or Organizations: No     Attends Banker Meetings: Never    Marital Status: Separated  Intimate Partner Violence: Not At Risk (09/12/2023)   Humiliation, Afraid, Rape, and Kick questionnaire    Fear of Current or Ex-Partner: No    Emotionally Abused: No    Physically Abused: No  Sexually Abused: No     FAMILY HISTORY: No family history on file.   PHYSICAL EXAM: BP (!) 84/64   Pulse (!) 116   Temp 98.5 F (36.9 C) (Axillary)   Resp 20   Ht 6' (1.829 m)   Wt 60.5 kg   SpO2 100%   BMI 18.09 kg/m   Constitutional - please see medical record for recorded vital signs.  The child appeared to be in no acute distress, with evidence of normal development, nutrition and body habitus.  There was no evidence of speech or language delays and the voice was normal.  Head and Face - inspection of the head and face revealed the scalp to be normal and skin without scars, lesions or masses.  There was no evidence of peri-orbital edema or erythema, or palpable sinus tenderness.  There was no evidence of salivary gland tenderness or enlargement.  Facial strength appeared intact and symmetric bilaterally.  Eyes - pupils were equal, round and reactive to light.  Extra-ocular movements were intact and vision was grossly normal bilaterally.  Ears - The external ears were normal in appearance.  Otoscopic exam revealed the external auditory canals to be patent.  The tympanic membranes were clear bilaterally, with normal mobility on pneumatic otoscopy.  There was no evidence of middle ear fluid, perforation, drainage or acute infection.  Hearing was grossly intact and speech reception thresholds were grossly within normal limits.  Nose - The external nose was normal in appearance.  The nasal dorsum was midline.  Exam of the anterior nasal cavity revealed no evidence of purulent drainage, polyps or mass or mucosal lesions.  The septum was midline and the inferior turbinate were normal in size and appearance.    Oral  cavity - The mucosa of the oral cavity and oropharynx was normal without mass or mucosal lesion, erythema or exudate.  The posterior pharyngeal wall, soft and hard palate and tongue all appeared normal.  There was no evidence of significant tonsillar hypertrophy.  Intubated with regular ETT.  Neck - The neck was nontender and without palpable adenopathy, crepitus or mass lesion.  The trachea was midline.  The thyroid exam revealed no evidence of enlargement, tenderness or mass lesion.  Repiratory - The patient was without respiratory distress, stridor or retractions.  Breath sounds were clear bilaterally.  Cardiovascular - Cardiovascular exam revealed a regular rate and rhythm with no evidence of murmur.  Extremeties without evidence of cyanosis, clubbing or edema.  Lymphatic System - there was no evidence of palpable adenopathy in the neck, supraclavicular fossae or axillae.  Neurologic - Cranial nerves II through XII were grossly intact bilaterally.  In particular the VII cranial nerve was intact and symmetric bilaterally.    IMAGING:   AUDIOLOGY:   Medical Decision Making  ASSESSMENT:  COPD exacerbation, now intubated and with failure to wean from ventilatory support.  Trach and PEG tube requested by the ICU team.    PLAN:  PEG will be handled by VIR.  Tracheotomy tentatively scheduled for this Friday, 09/26/23 at 7:15am.    I discussed the risks, benefits and options for the proposed procedure with the family at length today (patient's wife) - she understands and agree to proceed.  Risks discussed included bleeding, airway injury, need for reintubation, pneumothorax and possible risk of death.    Will plan on routine trach care postop and anticipate first trach change on approximately POD 5.    Discussed plan with ICU team today (NP) and appreciate  orders to make NPO after midnight on Thursday and a recheck of coags (PT/PTT) prior to surgery, perhaps sometime tomorrow.      Magnus Ivan. Okey Dupre, MD, MBA, [facility] Otolaryngology-Head & Neck Surgery [facility] [phone]

## 2023-09-25 ENCOUNTER — Encounter: Payer: Self-pay | Admitting: Anesthesiology

## 2023-09-25 ENCOUNTER — Inpatient Hospital Stay: Payer: 59

## 2023-09-25 ENCOUNTER — Encounter: Payer: Self-pay | Admitting: Internal Medicine

## 2023-09-25 DIAGNOSIS — J9601 Acute respiratory failure with hypoxia: Secondary | ICD-10-CM | POA: Diagnosis not present

## 2023-09-25 DIAGNOSIS — J9602 Acute respiratory failure with hypercapnia: Secondary | ICD-10-CM | POA: Diagnosis not present

## 2023-09-25 DIAGNOSIS — G9341 Metabolic encephalopathy: Secondary | ICD-10-CM | POA: Diagnosis not present

## 2023-09-25 DIAGNOSIS — J441 Chronic obstructive pulmonary disease with (acute) exacerbation: Secondary | ICD-10-CM | POA: Diagnosis not present

## 2023-09-25 LAB — BASIC METABOLIC PANEL
Anion gap: 9 (ref 5–15)
BUN: 36 mg/dL — ABNORMAL HIGH (ref 8–23)
CO2: 33 mmol/L — ABNORMAL HIGH (ref 22–32)
Calcium: 8.3 mg/dL — ABNORMAL LOW (ref 8.9–10.3)
Chloride: 95 mmol/L — ABNORMAL LOW (ref 98–111)
Creatinine, Ser: 0.62 mg/dL (ref 0.61–1.24)
GFR, Estimated: >60 mL/min (ref >60)
Glucose, Bld: 174 mg/dL — ABNORMAL HIGH (ref 70–99)
Potassium: 4.3 mmol/L (ref 3.5–5.1)
Sodium: 137 mmol/L (ref 135–145)

## 2023-09-25 LAB — URINALYSIS, COMPLETE (UACMP) WITH MICROSCOPIC
Bacteria, UA: NONE SEEN
Bilirubin Urine: NEGATIVE
Glucose, UA: 150 mg/dL — AB
Hgb urine dipstick: NEGATIVE
Ketones, ur: NEGATIVE mg/dL
Leukocytes,Ua: NEGATIVE
Nitrite: NEGATIVE
Protein, ur: NEGATIVE mg/dL
Specific Gravity, Urine: 1.024 (ref 1.005–1.030)
pH: 6 (ref 5.0–8.0)

## 2023-09-25 LAB — CBC WITH DIFFERENTIAL/PLATELET
Abs Immature Granulocytes: 0.13 10*3/uL — ABNORMAL HIGH (ref 0.00–0.07)
Basophils Absolute: 0 10*3/uL (ref 0.0–0.1)
Basophils Relative: 0 %
Eosinophils Absolute: 0 10*3/uL (ref 0.0–0.5)
Eosinophils Relative: 0 %
HCT: 30 % — ABNORMAL LOW (ref 39.0–52.0)
Hemoglobin: 9.1 g/dL — ABNORMAL LOW (ref 13.0–17.0)
Immature Granulocytes: 1 %
Lymphocytes Relative: 8 %
Lymphs Abs: 1.5 10*3/uL (ref 0.7–4.0)
MCH: 31 pg (ref 26.0–34.0)
MCHC: 30.3 g/dL (ref 30.0–36.0)
MCV: 102 fL — ABNORMAL HIGH (ref 80.0–100.0)
Monocytes Absolute: 1.3 10*3/uL — ABNORMAL HIGH (ref 0.1–1.0)
Monocytes Relative: 7 %
Neutro Abs: 15.1 10*3/uL — ABNORMAL HIGH (ref 1.7–7.7)
Neutrophils Relative %: 84 %
Platelets: 350 10*3/uL (ref 150–400)
RBC: 2.94 MIL/uL — ABNORMAL LOW (ref 4.22–5.81)
RDW: 15.3 % (ref 11.5–15.5)
WBC: 18 10*3/uL — ABNORMAL HIGH (ref 4.0–10.5)
nRBC: 0.2 % (ref 0.0–0.2)

## 2023-09-25 LAB — CBC
HCT: 30.1 % — ABNORMAL LOW (ref 39.0–52.0)
Hemoglobin: 9.1 g/dL — ABNORMAL LOW (ref 13.0–17.0)
MCH: 30.6 pg (ref 26.0–34.0)
MCHC: 30.2 g/dL (ref 30.0–36.0)
MCV: 101.3 fL — ABNORMAL HIGH (ref 80.0–100.0)
Platelets: 339 10*3/uL (ref 150–400)
RBC: 2.97 MIL/uL — ABNORMAL LOW (ref 4.22–5.81)
RDW: 15.2 % (ref 11.5–15.5)
WBC: 21.4 10*3/uL — ABNORMAL HIGH (ref 4.0–10.5)
nRBC: 0 % (ref 0.0–0.2)

## 2023-09-25 LAB — PROTIME-INR
INR: 1.2 (ref 0.8–1.2)
Prothrombin Time: 15.5 s — ABNORMAL HIGH (ref 11.4–15.2)

## 2023-09-25 LAB — GLUCOSE, CAPILLARY
Glucose-Capillary: 122 mg/dL — ABNORMAL HIGH (ref 70–99)
Glucose-Capillary: 138 mg/dL — ABNORMAL HIGH (ref 70–99)
Glucose-Capillary: 142 mg/dL — ABNORMAL HIGH (ref 70–99)
Glucose-Capillary: 157 mg/dL — ABNORMAL HIGH (ref 70–99)
Glucose-Capillary: 162 mg/dL — ABNORMAL HIGH (ref 70–99)
Glucose-Capillary: 196 mg/dL — ABNORMAL HIGH (ref 70–99)

## 2023-09-25 LAB — MAGNESIUM: Magnesium: 2.4 mg/dL (ref 1.7–2.4)

## 2023-09-25 LAB — PROCALCITONIN: Procalcitonin: 3.95 ng/mL

## 2023-09-25 LAB — PHOSPHORUS: Phosphorus: 2.8 mg/dL (ref 2.5–4.6)

## 2023-09-25 MED ORDER — SODIUM CHLORIDE 0.9 % IV SOLN
2.0000 g | Freq: Three times a day (TID) | INTRAVENOUS | Status: DC
  Administered 2023-09-25 – 2023-09-29 (×13): 2 g via INTRAVENOUS
  Filled 2023-09-25 (×13): qty 12.5

## 2023-09-25 MED ORDER — VASOPRESSIN 20 UNITS/100 ML INFUSION FOR SHOCK
0.0000 [IU]/min | INTRAVENOUS | Status: DC
  Administered 2023-09-25 – 2023-09-26 (×4): 0.03 [IU]/min via INTRAVENOUS
  Filled 2023-09-25 (×5): qty 100

## 2023-09-25 MED ORDER — SODIUM CHLORIDE 0.9 % IV BOLUS
500.0000 mL | Freq: Once | INTRAVENOUS | Status: AC
  Administered 2023-09-25: 500 mL via INTRAVENOUS

## 2023-09-25 MED ORDER — HYDROCORTISONE SOD SUC (PF) 100 MG IJ SOLR
50.0000 mg | Freq: Four times a day (QID) | INTRAMUSCULAR | Status: DC
  Administered 2023-09-25 – 2023-09-29 (×16): 50 mg via INTRAVENOUS
  Filled 2023-09-25: qty 2
  Filled 2023-09-25: qty 1
  Filled 2023-09-25 (×2): qty 2
  Filled 2023-09-25: qty 1
  Filled 2023-09-25 (×10): qty 2
  Filled 2023-09-25: qty 1

## 2023-09-25 MED ORDER — LINEZOLID 600 MG/300ML IV SOLN
600.0000 mg | Freq: Two times a day (BID) | INTRAVENOUS | Status: DC
  Administered 2023-09-25 – 2023-09-26 (×3): 600 mg via INTRAVENOUS
  Filled 2023-09-25 (×3): qty 300

## 2023-09-25 NOTE — Progress Notes (Signed)
Patient was transported to MRI and back with respiratory and transport with no distress noted. Informed Britton-Lee, NP that the MRI was completed. Vital signs remained stable with supportive medications in place.

## 2023-09-25 NOTE — Progress Notes (Signed)
Inpatient Progress Note   Attending:      Magnus Ivan. Okey Dupre, MD, MBA, FARS                         Otolaryngology-Head & Neck Surgery   This patient was seen today in at the request of No referring provider defined for this encounter. in regard to     Chief Complaint  Patient presents with   Shortness of Breath        CHIEF COMPLAINT:      Chief Complaint  Patient presents with   Shortness of Breath        HPI:   The patient is a 63 y.o. old child who presents today with complaint of     Chief Complaint  Patient presents with   Shortness of Breath    63 y.o. 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    Mr. Hise is followed by [facility] who presents to the hospital from [facility] clinic on 1/8 with increased shortness of breath. He was admitted to the medicine service for management of COPD exacerbation and found to have an RSV infection.   Patient was admitted to [facility] and initiated on nebulizers, steroids, and antibiotics. He was managed medically for COPD exacerbation and UTI but developed worsening respiratory distress this AM requiring the initiation of BiPAP. Rapid response was called and he was transferred to the ICU. Initial blood gas showed pH of 7.2, with repeat VBG showing he remained acidemic at 7.18. Given altered mental status and respiratory failure, decision made at the bedside to proceed with emergent intubation.   Asked to see by Dr. Belia Heman this past Monday, 09/24/23, for failure to wean from ventilatory support and possible tracheotomy placement.  The ICU team has also requested a PEG tube.  He is on Eliquis, but this has been held and coags will be checked later in the week prior to surgery per the ICU team.  Discussed with Dr. Belia Heman and ICU team today  - patient with ongoing clinical instability, hypotensive requiring pressors and elevated WBC today up to 18.  Will hold off on planned tracheotomy for tomorrow for now.  Will continue to follow however and available to re-schedule when stabilized.  Will request to re-check coags (PT/PTT) at that time.       REVIEW OF SYSTEMS: The patient / family denies any recent history of fever, night sweats or weight loss, pain, cyanosis, clubbing or edema, respiratory distress, dizziness or imbalance.     PAST MEDICAL HISTORY:     Past Medical History:  Diagnosis Date   COPD (chronic obstructive pulmonary disease) (HCC)     DVT (deep venous thrombosis) (HCC)     Dyspnea     GERD (gastroesophageal reflux disease)      no meds   HLD (hyperlipidemia)     Hx of migraines     Oxygen dependent      3 L [place]   Pneumonia 2023   Pre-diabetes     Pulmonary nodule     Rotator cuff tear, right     Tobacco abuse     Tobacco use                SURGICAL HISTORY:      Past  Surgical History:  Procedure Laterality Date   CHEST TUBE INSERTION       SHOULDER ARTHROSCOPY WITH SUBACROMIAL DECOMPRESSION, ROTATOR CUFF REPAIR AND BICEP TENDON REPAIR Right 02/18/2023    Procedure: RIGHT SHOULDER ARTHROSCOPY WITH DEBRIDEMENT, DECOMPRESSION, ROTATOR CUFF REPAIR, BICEPS TENODESIS.;  Surgeon: Christena Flake, MD;  Location: [facility] ORS;  Service: Orthopedics;  Laterality: Right;            MEDICATIONS:  Current Medications    Current Facility-Administered Medications:    acetaminophen (TYLENOL) tablet 650 mg, 650 mg, Per Tube, Q6H PRN, Tressie Ellis, RPH, 650 mg at 09/21/23 1610   arformoterol (BROVANA) nebulizer solution 15 mcg, 15 mcg, Nebulization, BID, Ezequiel Essex, NP, 15 mcg at 09/24/23 0811   [START ON 09/25/2023] ceFAZolin (ANCEF) IVPB 2g/100 mL premix, 2 g, Intravenous, Once, Omohundro, Darden Dates, NP   Chlorhexidine Gluconate Cloth 2 % PADS 6 each, 6 each, Topical, Daily, Marcelino Duster, MD, 6  each at 09/24/23 0830   diazepam (VALIUM) tablet 10 mg, 10 mg, Per Tube, Q12H, Harlon Ditty D, NP, 10 mg at 09/24/23 9604   docusate (COLACE) 50 MG/5ML liquid 100 mg, 100 mg, Per Tube, BID, Raechel Chute, MD, 100 mg at 09/24/23 0837   famotidine (PEPCID) tablet 20 mg, 20 mg, Per Tube, BID, Rust-Chester, Cecelia Byars, NP, 20 mg at 09/24/23 5409   feeding supplement (PROSource TF20) liquid 60 mL, 60 mL, Per Tube, TID, Assaker, West Bali, MD, 60 mL at 09/24/23 0837   feeding supplement (VITAL 1.5 CAL) liquid 1,000 mL, 1,000 mL, Per Tube, Continuous, Judithe Modest, NP, Stopped at 09/24/23 1000   free water 30 mL, 30 mL, Per Tube, Q4H, Dgayli, Khabib, MD, 30 mL at 09/24/23 0830   HYDROmorphone (DILAUDID) 50 mg in 50 mL NS (1mg /mL) premix infusion, 0.5-4 mg/hr, Intravenous, Continuous, Kasa, Wallis Bamberg, MD, Last Rate: 3 mL/hr at 09/24/23 1335, 3 mg/hr at 09/24/23 1335   HYDROmorphone (DILAUDID) bolus via infusion 0.25-2 mg, 0.25-2 mg, Intravenous, Q30 min PRN, Erin Fulling, MD, 2 mg at 09/24/23 0736   insulin aspart (novoLOG) injection 0-20 Units, 0-20 Units, Subcutaneous, Q4H, Rust-Chester, Britton L, NP, 3 Units at 09/24/23 1156   insulin aspart (novoLOG) injection 5 Units, 5 Units, Subcutaneous, Q4H, Rust-Chester, Britton L, NP, 5 Units at 09/24/23 1156   ipratropium-albuterol (DUONEB) 0.5-2.5 (3) MG/3ML nebulizer solution 3 mL, 3 mL, Nebulization, Q2H PRN, Aundria Rud, Khabib, MD, 3 mL at 09/14/23 1304   ipratropium-albuterol (DUONEB) 0.5-2.5 (3) MG/3ML nebulizer solution 3 mL, 3 mL, Nebulization, Q6H, Kasa, Kurian, MD, 3 mL at 09/24/23 1320   midazolam (VERSED) 100 mg/100 mL (1 mg/mL) premix infusion, 0.5-10 mg/hr, Intravenous, Continuous, Kasa, Kurian, MD, Last Rate: 2.5 mL/hr at 09/24/23 1335, 2.5 mg/hr at 09/24/23 1335   midazolam (VERSED) bolus via infusion 1-2 mg, 1-2 mg, Intravenous, Q2H PRN, Erin Fulling, MD   multivitamin with minerals tablet 1 tablet, 1 tablet, Per Tube, Daily, Tressie Ellis,  RPH, 1 tablet at 09/24/23 8119   norepinephrine (LEVOPHED) 16 mg in (0.064 mg/mL) premix infusion, 0-40 mcg/min, Intravenous, Titrated, Rust-Chester, Britton L, NP, Last Rate: 1.88 mL/hr at 09/24/23 1332, 2 mcg/min at 09/24/23 1332   ondansetron (ZOFRAN) injection 4 mg, 4 mg, Intravenous, Q8H PRN, Lorretta Harp, MD   Oral care mouth rinse, 15 mL, Mouth Rinse, Q2H, Dgayli, Khabib, MD, 15 mL at 09/24/23 1334   Oral care mouth rinse, 15 mL, Mouth Rinse, PRN, Raechel Chute, MD   polyethylene glycol (MIRALAX / GLYCOLAX) packet 17  g, 17 g, Per Tube, Daily, Dgayli, Lianne Bushy, MD, 17 g at 09/20/23 0945   [COMPLETED] predniSONE (DELTASONE) tablet 30 mg, 30 mg, Per Tube, Q breakfast, 30 mg at 09/22/23 0809 **FOLLOWED BY** predniSONE (DELTASONE) tablet 20 mg, 20 mg, Per Tube, Q breakfast, 20 mg at 09/24/23 0838 **FOLLOWED BY** [START ON 09/26/2023] predniSONE (DELTASONE) tablet 10 mg, 10 mg, Per Tube, Q breakfast, Assaker, West Bali, MD   QUEtiapine (SEROQUEL) tablet 50 mg, 50 mg, Per Tube, BID, Assaker, Jean-Pierre, MD, 50 mg at 09/24/23 2355   sodium chloride flush (NS) 0.9 % injection 10-40 mL, 10-40 mL, Intracatheter, Q12H, Aleskerov, Fuad, MD, 10 mL at 09/24/23 0831   sodium chloride flush (NS) 0.9 % injection 10-40 mL, 10-40 mL, Intracatheter, PRN, Vida Rigger, MD, 10 mL at 09/13/23 2151   vecuronium (NORCURON) injection 20 mg, 20 mg, Intravenous, Q2H PRN, Erin Fulling, MD       ALLERGIES: Allergies  No Active Allergies       BIRTH HX:  Term - no complications.     SOCIAL HISTORY: Social History         Socioeconomic History   Marital status: Single      Spouse name: Not on file   Number of children: Not on file   Years of education: Not on file   Highest education level: Not on file  Occupational History   Not on file  Tobacco Use   Smoking status: Some Days      Current packs/day: 0.25      Average packs/day: 0.3 packs/day for 48.0 years (12.0 ttl pk-yrs)      Types:  Cigarettes   Smokeless tobacco: Never  Vaping Use   Vaping status: Never Used  Substance and Sexual Activity   Alcohol use: Yes      Comment: occ   Drug use: Never   Sexual activity: Not on file  Other Topics Concern   Not on file  Social History Narrative   Not on file    Social Drivers of Health        Financial Resource Strain: Low Risk  (08/25/2023)    Received from [facility]    Overall Financial Resource Strain (CARDIA)     Difficulty of Paying Living Expenses: Not very hard  Recent Concern: Financial Resource Strain - Medium Risk (07/23/2023)    Received from [facility]    Overall Financial Resource Strain (CARDIA)     Difficulty of Paying Living Expenses: Somewhat hard  Food Insecurity: No Food Insecurity (09/12/2023)    Hunger Vital Sign     Worried About Running Out of Food in the Last Year: Never true     Ran Out of Food in the Last Year: Never true  Recent Concern: Food Insecurity - Food Insecurity Present (08/25/2023)    Received from [facility]    Hunger Vital Sign     Worried About Running Out of Food in the Last Year: Often true     Ran Out of Food in the Last Year: Often true  Transportation Needs: No Transportation Needs (09/12/2023)    PRAPARE - Therapist, art (Medical): No     Lack of Transportation (Non-Medical): No  Recent Concern: Transportation Needs - Unmet Transportation Needs (08/25/2023)    Received from [facility] - Transportation     In the past 12 months, has lack of transportation kept you from  medical appointments or from getting medications?: Yes     Lack of Transportation (Non-Medical): No  Physical Activity: Insufficiently Active (08/25/2023)    Received from [facility]    Exercise Vital Sign     Days of Exercise per Week: 1 day     Minutes of Exercise per Session: 60 min  Stress: No Stress Concern  Present (08/25/2023)    Received from [facility] - Occupational Stress Questionnaire     Feeling of Stress : Not at all  Social Connections: Socially Isolated (08/25/2023)    Received from [facility]    Social Connection and Isolation Panel [NHANES]     Frequency of Communication with Friends and Family: Twice a week     Frequency of Social Gatherings with Friends and Family: Never     Attends Religious Services: Never     Database administrator or Organizations: No     Attends Banker Meetings: Never     Marital Status: Separated  Intimate Partner Violence: Not At Risk (09/12/2023)    Humiliation, Afraid, Rape, and Kick questionnaire     Fear of Current or Ex-Partner: No     Emotionally Abused: No     Physically Abused: No     Sexually Abused: No        FAMILY HISTORY: No family history on file.         PHYSICAL EXAM: BP (!) 84/64   Pulse (!) 116   Temp 98.5 F (36.9 C) (Axillary)   Resp 20   Ht 6' (1.829 m)   Wt 60.5 kg   SpO2 100%   BMI 18.09 kg/m   Constitutional - please see medical record for recorded vital signs.  The child appeared to be in no acute distress, with evidence of normal development, nutrition and body habitus.  There was no evidence of speech or language delays and the voice was normal.   Head and Face - inspection of the head and face revealed the scalp to be normal and skin without scars, lesions or masses.  There was no evidence of peri-orbital edema or erythema, or palpable sinus tenderness.  There was no evidence of salivary gland tenderness or enlargement.  Facial strength appeared intact and symmetric bilaterally.   Eyes - pupils were equal, round and reactive to light.  Extra-ocular movements were intact and vision was grossly normal bilaterally.   Ears - The external ears were normal in appearance.  Otoscopic exam revealed the external auditory  canals to be patent.  The tympanic membranes were clear bilaterally, with normal mobility on pneumatic otoscopy.  There was no evidence of middle ear fluid, perforation, drainage or acute infection.  Hearing was grossly intact and speech reception thresholds were grossly within normal limits.   Nose - The external nose was normal in appearance.  The nasal dorsum was midline.  Exam of the anterior nasal cavity revealed no evidence of purulent drainage, polyps or mass or mucosal lesions.  The septum was midline and the inferior turbinate were normal in size and appearance.     Oral cavity - The mucosa of the oral cavity and oropharynx was normal without mass or mucosal lesion, erythema or exudate.  The posterior pharyngeal wall, soft and hard palate and tongue all appeared normal.  There was no evidence of significant tonsillar hypertrophy.  Intubated with regular ETT.   Neck - The neck was  nontender and without palpable adenopathy, crepitus or mass lesion.  The trachea was midline.  The thyroid exam revealed no evidence of enlargement, tenderness or mass lesion.   Repiratory - The patient was without respiratory distress, stridor or retractions.  Breath sounds were clear bilaterally.   Cardiovascular - Cardiovascular exam revealed a regular rate and rhythm with no evidence of murmur.  Extremeties without evidence of cyanosis, clubbing or edema.   Lymphatic System - there was no evidence of palpable adenopathy in the neck, supraclavicular fossae or axillae.   Neurologic - Cranial nerves II through XII were grossly intact bilaterally.  In particular the VII cranial nerve was intact and symmetric bilaterally.      IMAGING:     AUDIOLOGY:     Medical Decision Making   ASSESSMENT:   COPD exacerbation, now intubated and with failure to wean from ventilatory support.  Trach and PEG tube requested by the ICU team.  Increased instability over past 24 hours with elevated WBC and need for pressors  for hypotension.       PLAN:   PEG will be handled by VIR.  Tracheotomy tentatively scheduled for this Friday, 09/26/23 at 7:15am.     I discussed the risks, benefits and options for the proposed procedure with the family at length today (patient's wife) - she understands and agree to proceed.  Risks discussed included bleeding, airway injury, need for reintubation, pneumothorax and possible risk of death.     Will plan on routine trach care postop and anticipate first trach change on approximately POD 5.     Discussed plan with ICU team today (NP) and appreciate orders to make NPO after midnight on Thursday and a recheck of coags (PT/PTT) prior to surgery, perhaps sometime tomorrow.   Discussed with Dr. Belia Heman and ICU team today - patient with ongoing clinical instability, hypotensive requiring pressors and elevated WBC today up to 18.  Will hold off on planned tracheotomy for tomorrow for now.  Will continue to follow however and available to re-schedule when stabilized.  Will request to re-check coags (PT/PTT) at that time.    Magnus Ivan. Okey Dupre, MD, MBA, [facility] Otolaryngology-Head & Neck Surgery [facility] [phone]

## 2023-09-25 NOTE — Progress Notes (Signed)
Nutrition Follow-up  DOCUMENTATION CODES:   Non-severe (moderate) malnutrition in context of chronic illness  INTERVENTION:   Once able to resume feeds:  -TF via OGT:    Vital 1.5 @ 20 ml/hr and increase by 10 ml every 8 hours to goal rate of 55 ml/hr.    30 ml free water flush every 4 hours   Tube feeding regimen provides 1931 kcals, 99 grams of protein, and 1250 ml of H2O.     -Monitor Mg, K, and Phos and replete as needed secondary to high refeeding risk -Continue MVI with minerals daily per tube  NUTRITION DIAGNOSIS:   Moderate Malnutrition related to chronic illness (pt sedated and ventilated) as evidenced by moderate fat depletion, moderate muscle depletion.  Ongoing  GOAL:   Provide needs based on ASPEN/SCCM guidelines  Unmet  MONITOR:   Vent status, Labs, Weight trends, TF tolerance, Skin, I & O's  REASON FOR ASSESSMENT:   Consult Enteral/tube feeding initiation and management  ASSESSMENT:   63 y/o male with h/o COPD, DVT, HLD, GERD, pulmonary nodule and DM who is admitted with RSV, pneumonia and COPD exacerbation.  Patient is currently intubated on ventilator support. OGT in place; per KUB 09/20/22, tip of tube in stomach.  MV: 7.5 L/min Temp (24hrs), Avg:98.8 F (37.1 C), Min:97 F (36.1 C), Max:101.1 F (38.4 C)  Reviewed I/O's: -2 L x 24 hours and +6.7 L since admission  UOP: 3.2 L x 24 hours   MAP: 76  Pt febrile and increased vasopressor requirements, concerning for recurrent sepsis.  Noted TF has been off since 09/24/23. Case discussed with RN; plan for trach and PEG placement. RN reports pt is not doing well today. Consent for PEG was obtained on 09/23/23. Per IR notes, pt with ongoing clinical instability with elevated WBC and pressor support. [name] on 09/21/23 reveals ongoing ileus. Per RN, IR is requesting addition CT of abdomen to assess for improvement of ileus and possibility for percutaneous placement.   Per ENT, plan to hold off on  trach tomorrow.   Medications reviewed and include colace, pepcid, solu-cortef, versed, levophed, and pitressin, and miralax.   Labs reviewed: CBGS: 84-157 (inpatient orders for glycemic control are 0-20 units insulin aspart every 4 hours and 5 units insulin aspart every 4 hours).    NUTRITION - FOCUSED PHYSICAL EXAM:  Flowsheet Row Most Recent Value  Orbital Region Mild depletion  Upper Arm Region Moderate depletion  Thoracic and Lumbar Region Mild depletion  Buccal Region Moderate depletion  Temple Region Moderate depletion  Clavicle Bone Region Severe depletion  Clavicle and Acromion Bone Region Moderate depletion  Scapular Bone Region Severe depletion  Dorsal Hand No depletion  Patellar Region Severe depletion  Anterior Thigh Region Severe depletion  Posterior Calf Region Severe depletion  Edema (RD Assessment) None  Hair Reviewed  Eyes Reviewed  Mouth Reviewed  Skin Reviewed  Nails Reviewed       Diet Order:   Diet Order             Diet NPO time specified  Diet effective midnight           Diet NPO time specified  Diet effective now                   EDUCATION NEEDS:   No education needs have been identified at this time  Skin:  Skin Assessment: Reviewed RN Assessment  Last BM:  09/18/23 (type 6)  Height:   Ht Readings from  Last 1 Encounters:  09/10/23 6' (1.829 m)    Weight:   Wt Readings from Last 1 Encounters:  09/25/23 58.5 kg    Ideal Body Weight:  80.9 kg  BMI:  Body mass index is 17.49 kg/m.  Estimated Nutritional Needs:   Kcal:  1808  Protein:  90-100g/day  Fluid:  1.7-1.9L/day    Levada Schilling, RD, LDN, CDCES Registered Dietitian III Certified Diabetes Care and Education Specialist If unable to reach this RD, please use "RD Inpatient" group chat on secure chat between hours of 8am-4 pm daily

## 2023-09-25 NOTE — Anesthesia Preprocedure Evaluation (Deleted)
Anesthesia Evaluation    Reviewed: H&P   Airway Mallampati: II  TM Distance: >3 FB Neck ROM: full    Dental no notable dental hx.    Pulmonary COPD,  oxygen dependent, Current Smoker and Patient abstained from smoking.   Pulmonary exam normal        Cardiovascular negative cardio ROS Normal cardiovascular exam  Per intensivist Note: CARDIAC  Mildly elevated troponin secondary to demand ischemia  Hypotension suspect secondary to possible sepsis and sedating medication  HOLD ALL ATIPLT AND AC therapy for prcodeures Hx: HLD and DVT on Eliquis Echo 09/14/23: 50 to 55%, indeterminate diastolic parameters - Cardiology signed off no further cardiac evaluations indicated at this time    Neuro/Psych negative neurological ROS  negative psych ROS   GI/Hepatic Neg liver ROS,GERD  ,,  Endo/Other  negative endocrine ROS    Renal/GU      Musculoskeletal   Abdominal   Peds  Hematology  (+) Blood dyscrasia, anemia   Anesthesia Other Findings 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 admitted with Acute on Chronic Hypoxic and Hypercapnic Respiratory Failure in the setting of Acute COPD Exacerbation due to RSV Pneumonia requiring intubation and mechanical ventilation  Past Medical History: No date: COPD (chronic obstructive pulmonary disease) (HCC) No date: DVT (deep venous thrombosis) (HCC) No date: Dyspnea No date: GERD (gastroesophageal reflux disease)     Comment:  no meds No date: HLD (hyperlipidemia) No date: Hx of migraines No date: Oxygen dependent     Comment:  3 L [place] 2023: Pneumonia No date: Pre-diabetes No date: Pulmonary nodule No date: Rotator cuff tear, right No date: Tobacco abuse No date: Tobacco use  Past Surgical History: No date: CHEST TUBE INSERTION 02/18/2023: SHOULDER ARTHROSCOPY WITH SUBACROMIAL DECOMPRESSION,  ROTATOR CUFF REPAIR AND BICEP TENDON REPAIR; Right     Comment:  Procedure: RIGHT  SHOULDER ARTHROSCOPY WITH DEBRIDEMENT,               DECOMPRESSION, ROTATOR CUFF REPAIR, BICEPS TENODESIS.;                Surgeon: Christena Flake, MD;  Location: [facility] ORS;                Service: Orthopedics;  Laterality: Right;  BMI    Body Mass Index: 17.49 kg/m      Reproductive/Obstetrics negative OB ROS                             Anesthesia Physical Anesthesia Plan  ASA: 4  Anesthesia Plan: General ETT   Post-op Pain Management:    Induction: Intravenous  PONV Risk Score and Plan: 2 and Midazolam  Airway Management Planned:   Additional Equipment:   Intra-op Plan:   Post-operative Plan:   Informed Consent:   Plan Discussed with:   Anesthesia Plan Comments:         Anesthesia Quick Evaluation

## 2023-09-25 NOTE — Progress Notes (Signed)
NAME:  Benjamin Bolton, MRN:  [id number], DOB:  14-Nov-1960, LOS: 15 ADMISSION DATE:  09/10/2023, CONSULTATION DATE: 09/13/2023  Brief Pt Description / Synopsis:  63 y.o. 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   History of Present Illness:  Benjamin Bolton is a 63 year old male with a history of severe COPD followed by [facility] Pulmonology who presents to the hospital from [facility] clinic on 1/8 with increased shortness of breath. He was admitted to the medicine service for management of COPD exacerbation and found to have an RSV infection.   Patient was admitted to [facility] and initiated on nebulizers, steroids, and antibiotics. He was managed medically for COPD exacerbation and UTI but developed worsening respiratory distress this AM requiring the initiation of BiPAP. Rapid response was called and he was transferred to the ICU. Initial blood gas showed pH of 7.2, with repeat VBG showing he remained acidemic at 7.18. Given altered mental status and respiratory failure, decision made at the bedside to proceed with emergent intubation.  Pertinent  Medical History  DVT on Eliquis COPD on 2L O2 Type II Diabetes Mellitus  GERD HLD Pulmonary Nodule Tobacco Abuse   Micro Data:  1/8: SARS-CoV-2 PCR>>negative 1/8: Blood culture x2>> no growth 1/8: Urine>> multiple species (suggest recollection) 1/9: RVP>> + RSV 1/9: Sputum>> normal respiratory flora 1/9: HIV Screen>> non reactive 1/11: MRSA PCR>> negative 1/19: MRSA PCR>> 1/19: RVP>>  Antimicrobials:   Anti-infectives (From admission, onward)    Start     Dose/Rate Route Frequency Ordered Stop   09/25/23 0000  ceFAZolin (ANCEF) IVPB 2g/100 mL premix        2 g 200 mL/hr over 30 Minutes Intravenous  Once 09/23/23 1103 09/25/23 0013    09/22/23 0010  vancomycin (VANCOCIN) IVPB 750 mg/150 ml premix  Status:  Discontinued        750 mg 150 mL/hr over 60 Minutes Intravenous Every 12 hours 09/22/23 0004 09/22/23 1039   09/22/23 0000  Vancomycin (VANCOCIN) 750 mg IVPB  Status:  Discontinued        750 mg 150 mL/hr over 60 Minutes Intravenous Every 12 hours 09/21/23 1120 09/21/23 2356   09/22/23 0000  vancomycin (VANCOREADY) IVPB 750 mg/150 mL  Status:  Discontinued        750 mg 150 mL/hr over 60 Minutes Intravenous Every 12 hours 09/21/23 2356 09/22/23 0004   09/21/23 1300  vancomycin (VANCOCIN) IVPB 1000 mg/200 mL premix        1,000 mg 200 mL/hr over 60 Minutes Intravenous  Once 09/21/23 1120 09/21/23 1900   09/21/23 1215  piperacillin-tazobactam (ZOSYN) IVPB 3.375 g  Status:  Discontinued        3.375 g 12.5 mL/hr over 240 Minutes Intravenous Every 8 hours 09/21/23 1120 09/23/23 1257   09/21/23 1130  vancomycin (VANCOREADY) IVPB 1250 mg/250 mL  Status:  Discontinued        1,250 mg 166.7 mL/hr over 90 Minutes Intravenous  Once 09/21/23 1046 09/21/23 1120   09/21/23 1100  piperacillin-tazobactam (ZOSYN) IVPB 3.375 g  Status:  Discontinued        3.375 g 12.5 mL/hr over 240 Minutes Intravenous  Once 09/21/23 1046 09/21/23 1152   09/13/23 1445  piperacillin-tazobactam (ZOSYN) IVPB 3.375 g        3.375 g 12.5 mL/hr over  240 Minutes Intravenous Every 8 hours 09/13/23 1351 09/17/23 0157   09/10/23 2315  cefTRIAXone (ROCEPHIN) 1 g in sodium chloride 0.9 % 100 mL IVPB  Status:  Discontinued        1 g 200 mL/hr over 30 Minutes Intravenous Every 24 hours 09/10/23 2305 09/13/23 1336      Significant Hospital Events: Including procedures, antibiotic start and stop dates in addition to other pertinent events   1/08: admit to [facility] 1/11: rapid response, transfer to ICU, intubated 1/12: remains vented, sedated, does not follow commands.  Failed SBT  1/13: Overnight due to concerns of aspiration TF's held.  Will repeat SBT  today 1/14: Overnight pt had possible seizure activity with rhythmic tremors noted in the mouth/lower face/neck during episode pt unresponsive to pain.  Received 2 mg of versed and 2g keppra bolus with resolution of symptoms.  CT Head negative.  Standing dose of keppra 1g bid.  Neurology consulted.    1/15: On minimal vent settings, unable to tolerate WUA due to increased WOB. Shifting measures given for Hyperkalemia of 5.5 with peaked T waves.  Abdomen distended, KUB without evidence of obstruction, give Lactulose.  Diurese with 40 mg IV Lasix x1. 1/16: Failed WUA, with increased WOB and hypoxic with O2 sats dropping to mid 70's.  Add Seroquel.  Palliative Care consulted to assist with GOC. 1/17: Precedex started for WUA.  Failed SBT due to increased WOB, tachypnea, and hypoxia.  Increase Seroquel to 50 mg BID. Transition Heparin to Elqiuis 1/18: Pt remains mechanically intubated and has failed multiple SBT's.  Planning for family meeting today at bedside with palliative care and PCCM will perform SBT once family arrives at bedside.  Remains sedated with propofol and fentanyl gtts.  Family meeting held code status changed to DNR per family request.  Pts family trying to decide if they want to proceed with tracheostomy.  Required additional sedation and prn vecuronium overnight due to vent dyssynchrony  1/19: Pt febrile overnight with increased vasopressor requirements levophed gtt @12  mcg/min and febrile tmax 101 degrees F concerning for recurrent sepsis.  Broad spectrum abx started. Pt remains mechanically intubated and unable to successfully liberate from ventilator  1/20 remains on vent, severe rap failure 1/21 remains on vent prolonged exp phase air trapping  on vent 1/22 remains on vent   Interim History / Subjective:  Remains critically ill Remains intubated Severe hypoxia Requires VENT support for survival Severe end stage COPD Needs TRACH AND PEG TUBE TO SURVIVE +fevers  Vent Mode:  PRVC FiO2 (%):  [30 %-70 %] 55 % Set Rate:  [12 bmp] 12 bmp Vt Set:  [570 mL] 570 mL PEEP:  [8 cmH20] 8 cmH20 Plateau Pressure:  [21 cmH20-22 cmH20] 21 cmH20     Objective   Blood pressure 95/68, pulse (!) 102, temperature 100.1 F (37.8 C), temperature source Axillary, resp. rate 12, height 6' (1.829 m), weight 58.5 kg, SpO2 100%.    Vent Mode: PRVC FiO2 (%):  [30 %-70 %] 55 % Set Rate:  [12 bmp] 12 bmp Vt Set:  [570 mL] 570 mL PEEP:  [8 cmH20] 8 cmH20 Plateau Pressure:  [21 cmH20-22 cmH20] 21 cmH20   Intake/Output Summary (Last 24 hours) at 09/25/2023 4782 Last data filed at 09/25/2023 0600 Gross per 24 hour  Intake 784.78 ml  Output 3170 ml  Net -2385.22 ml   Filed Weights   09/23/23 0414 09/24/23 0451 09/25/23 0429  Weight: 62.2 kg 60.5 kg 58.5 kg  REVIEW OF SYSTEMS  PATIENT IS UNABLE TO PROVIDE COMPLETE REVIEW OF SYSTEMS DUE TO SEVERE CRITICAL ILLNESS   PHYSICAL EXAMINATION:  GENERAL:critically ill appearing, +resp distress EYES: Pupils equal, round, reactive to light.  No scleral icterus.  MOUTH: Moist mucosal membrane. INTUBATED NECK: Supple.  PULMONARY: Lungs clear to auscultation, +rhonchi, +wheezing CARDIOVASCULAR: S1 and S2.  Regular rate and rhythm GASTROINTESTINAL: Soft, nontender, -distended. Positive bowel sounds.  MUSCULOSKELETAL: No swelling, clubbing, or edema.  NEUROLOGIC: obtunded,sedated SKIN:normal, warm to touch, Capillary refill delayed  Pulses present bilaterally       Assessment & Plan:  63 yo AAM with Acute  metabolic encephalopathy due to severe hypoxic and hypercapnic resp failure due to severe COPD exacerbation with sepsis and RSV pneumonia complicated by demand ischemia   Severe ACUTE Hypoxic and Hypercapnic Respiratory Failure -continue Mechanical Ventilator support -Wean Fio2 and PEEP as tolerated -VAP/VENT bundle implementation - Wean PEEP & FiO2 as tolerated, maintain SpO2 > 88% - Head of bed elevated 30 degrees,  VAP protocol in place - Plateau pressures less than 30 cm H20  - Intermittent chest x-ray & ABG PRN - Ensure adequate pulmonary hygiene  Failure to wean from vent Needs TRACH to survive  NEUROLOGY ACUTE METABOLIC ENCEPHALOPATHY Mechanically intubation pain/discomfort  Possible seizure activity~very low suspicion   - Continue thiamine and mvi  - Keppra was discontinued - Seizure precautions    SEVERE COPD EXACERBATION -continue IV steroids as prescribed -continue NEB THERAPY as prescribed  INFECTIOUS DISEASE -continue antibiotics as prescribed -follow up cultures Fevers High risk for VAP and blood clots    CARDIAC  Mildly elevated troponin secondary to demand ischemia  Hypotension suspect secondary to possible sepsis and sedating medication  HOLD ALL ATIPLT AND AC therapy for prcodeures Hx: HLD and DVT on Eliquis Echo 09/14/23: 50 to 55%, indeterminate diastolic parameters - Cardiology signed off no further cardiac evaluations indicated at this time   ACUTE ANEMIA- TRANSFUSE AS NEEDED CONSIDER TRANSFUSION  IF HGB<7   ENDO - ICU hypoglycemic\Hyperglycemia protocol -check FSBS per protocol   GI GI PROPHYLAXIS as indicated  NUTRITIONAL STATUS DIET-->TF's on hold  Constipation protocol as indicated Plan for PEG tube  ELECTROLYTES -follow labs as needed -replace as needed -pharmacy consultation and following   Pt is critically ill with AECOPD in the setting of RSV and pneumonia.  Anticipate difficulty in liberating from mechanical ventilation.  May end up needing Tracheostomy.  Scheduled meeting on Monday 09/22/23 at 1300  Best Practice (right click and "Reselect all SmartList Selections" daily)   Diet/type: TF's DVT prophylaxis Apixaban  Pressure ulcer(s): N/A GI prophylaxis: Pepcid  Lines: RUE PICC Line and still needed  Foley:  External Catheter  Code Status: DNR Last date of multidisciplinary goals of care discussion [09/22/2023]  01/20: proceed  with trach and PEG tube Labs   CBC: Recent Labs  Lab 09/21/23 0333 09/22/23 0400 09/23/23 0519 09/24/23 0418 09/25/23 0420  WBC 9.1 8.6 8.5 13.2* 18.0*  NEUTROABS  --   --   --   --  15.1*  HGB 9.6* 9.0* 9.1* 9.1* 9.1*  HCT 30.0* 27.6* 29.3* 30.3* 30.0*  MCV 97.7 94.8 98.0 101.7* 102.0*  PLT 309 265 294 326 350    Basic Metabolic Panel: Recent Labs  Lab 09/19/23 0556 09/20/23 0406 09/21/23 0333 09/22/23 0400 09/23/23 0519 09/24/23 0418 09/25/23 0420  NA 133* 133* 133* 136 138 139 137  K 4.8 4.1 4.1 4.1 3.8 4.9 4.3  CL 89* 92* 91* 95* 92*  93* 95*  CO2 34* 33* 32 34* 36* 37* 33*  GLUCOSE 107* 183* 169* 142* 152* 209* 174*  BUN 23 26* 30* 26* 31* 34* 36*  CREATININE 0.62 0.53* 0.51* 0.45* 0.60* 0.45* 0.62  CALCIUM 8.8* 8.8* 8.4* 8.4* 8.6* 8.9 8.3*  MG 2.2  --   --  2.1  --  2.2 2.4  PHOS 3.0 3.0 3.2 2.6  --  2.6 2.8   GFR: Estimated Creatinine Clearance: 80.2 mL/min (by C-G formula based on SCr of 0.62 mg/dL). Recent Labs  Lab 09/21/23 0333 09/22/23 0400 09/23/23 0519 09/24/23 0418 09/25/23 0420  PROCALCITON 1.55  --   --   --   --   WBC 9.1 8.6 8.5 13.2* 18.0*  LATICACIDVEN 1.0  --   --   --   --     Liver Function Tests: Recent Labs  Lab 09/19/23 0556 09/20/23 0406 09/21/23 0333 09/22/23 0400  ALBUMIN 2.4* 2.5* 2.5* 2.2*   No results for input(s): "LIPASE", "AMYLASE" in the last 168 hours. No results for input(s): "AMMONIA" in the last 168 hours.  ABG    Component Value Date/Time   PHART 7.43 09/20/2023 2332   PCO2ART 62 (H) 09/20/2023 2332   PO2ART 53 (L) 09/20/2023 2332   HCO3 41.2 (H) 09/20/2023 2332   O2SAT 86.6 09/20/2023 2332     Coagulation Profile: Recent Labs  Lab 09/22/23 1427  INR 1.1     Cardiac Enzymes: No results for input(s): "CKTOTAL", "CKMB", "CKMBINDEX", "TROPONINI" in the last 168 hours.  HbA1C: Hgb A1c MFr Bld  Date/Time Value Ref Range Status  09/15/2023 09:24 PM 6.4 (H) 4.8 - 5.6 % Final    Comment:     (NOTE) Pre diabetes:          5.7%-6.4%  Diabetes:              >6.4%  Glycemic control for   <7.0% adults with diabetes     CBG: Recent Labs  Lab 09/24/23 1128 09/24/23 1630 09/24/23 1931 09/24/23 2321 09/25/23 0411  GLUCAP 131* 89 84 95 122*    Review of Systems:   Unable to assess pt mechanically intubated   Past Medical History:  He,  has a past medical history of COPD (chronic obstructive pulmonary disease) (HCC), DVT (deep venous thrombosis) (HCC), Dyspnea, GERD (gastroesophageal reflux disease), HLD (hyperlipidemia), migraines, Oxygen dependent, Pneumonia (2023), Pre-diabetes, Pulmonary nodule, Rotator cuff tear, right, Tobacco abuse, and Tobacco use.   Surgical History:   Past Surgical History:  Procedure Laterality Date   CHEST TUBE INSERTION     SHOULDER ARTHROSCOPY WITH SUBACROMIAL DECOMPRESSION, ROTATOR CUFF REPAIR AND BICEP TENDON REPAIR Right 02/18/2023   Procedure: RIGHT SHOULDER ARTHROSCOPY WITH DEBRIDEMENT, DECOMPRESSION, ROTATOR CUFF REPAIR, BICEPS TENODESIS.;  Surgeon: Christena Flake, MD;  Location: [facility] ORS;  Service: Orthopedics;  Laterality: Right;     Social History:   reports that he has been smoking cigarettes. He has a 12 pack-year smoking history. He has never used smokeless tobacco. He reports current alcohol use. He reports that he does not use drugs.   Family History:  His family history is not on file.   Allergies No Active Allergies   Home Medications  Prior to Admission medications   Medication Sig Start Date End Date Taking? Authorizing Provider  albuterol (PROVENTIL) (2.5 MG/3ML) 0.083% nebulizer solution USE 1 VIAL IN NEBULIZER EVERY 6 HOURS AS NEEDED FOR WHEEZING 06/28/21  Yes [provider]  albuterol (VENTOLIN HFA) 108 (90 Base)  MCG/ACT inhaler Inhale 1 puff into the lungs every 4 (four) hours as needed for wheezing or shortness of breath.   Yes [provider]  APIXABAN Everlene Balls) VTE STARTER PACK (10MG  AND 5MG )  Take as directed on package: start with two-5mg  tablets twice daily for 7 days. On day 8, switch to one-5mg  tablet twice daily. 02/26/23  Yes Merwyn Katos, MD  ondansetron (ZOFRAN) 4 MG tablet Take 1 tablet (4 mg total) by mouth every 6 (six) hours as needed for nausea. 02/19/23  Yes Anson Oregon, PA-C  oxyCODONE (OXY IR/ROXICODONE) 5 MG immediate release tablet Take 1 tablet by mouth 2 (two) times daily as needed. 08/29/23  Yes [provider]  OXYGEN Inhale 3 L into the lungs continuous.   Yes [provider]  predniSONE (DELTASONE) 10 MG tablet Take 2-6 tablets by mouth daily with breakfast.  ORIGINAL ZOX:WRUE 6 tabs daily x4 days; then 4 tabs daily x4 days; then 2 tabs daily x4 days.   Yes [provider]  predniSONE (DELTASONE) 20 MG tablet Take 20 mg by mouth daily with breakfast.   Yes [provider]  TRELEGY ELLIPTA 100-62.5-25 MCG/ACT AEPB Inhale 1 puff into the lungs every morning. 07/04/21  Yes [provider]  amoxicillin-clavulanate (AUGMENTIN) 875-125 MG tablet Take 1 tablet by mouth every 12 (twelve) hours. Patient not taking: Reported on 09/10/2023    [provider]  benralizumab (FASENRA PEN) 30 MG/ML prefilled autoinjector Inject 30 mg into the skin as directed. Patient not taking: Reported on 09/10/2023 07/23/23   [provider]  nystatin (MYCOSTATIN) 100000 UNIT/ML suspension Take 5 mLs by mouth 4 (four) times daily. Patient not taking: Reported on 09/10/2023 09/04/23   [provider]  omeprazole (PRILOSEC) 20 MG capsule Take 20 mg by mouth daily. Patient not taking: Reported on 09/10/2023    [provider]  oxyCODONE (OXY IR/ROXICODONE) 5 MG immediate release tablet Take 1-2 tablets (5-10 mg total) by mouth every 4 (four) hours as needed for severe pain. 02/19/23   Anson Oregon, PA-C      DVT/GI PRX  assessed I Assessed the need for Labs I Assessed the need for Foley I Assessed the need  for Central Venous Line Family Discussion when available I Assessed the need for Mobilization I made an Assessment of medications to be adjusted accordingly Safety Risk assessment completed  CASE DISCUSSED IN MULTIDISCIPLINARY ROUNDS WITH ICU TEAM     Critical Care Time devoted to patient care services described in this note is 55 minutes.  Critical care was necessary to treat /prevent imminent and life-threatening deterioration. Overall, patient is critically ill, prognosis is guarded.  Patient with Multiorgan failure and at high risk for cardiac arrest and death.    Lucie Leather, M.D.  [facility]  Medical Director [facility] Medical Director [facility] Cardio-Pulmonary Department

## 2023-09-25 NOTE — Plan of Care (Signed)
  Problem: Education: Goal: Knowledge of General Education information will improve Description: Including pain rating scale, medication(s)/side effects and non-pharmacologic comfort measures Outcome: Progressing   Problem: Health Behavior/Discharge Planning: Goal: Ability to manage health-related needs will improve Outcome: Progressing   Problem: Clinical Measurements: Goal: Ability to maintain clinical measurements within normal limits will improve Outcome: Progressing Goal: Will remain free from infection Outcome: Progressing Goal: Diagnostic test results will improve Outcome: Progressing Goal: Respiratory complications will improve Outcome: Progressing Goal: Cardiovascular complication will be avoided Outcome: Progressing   Problem: Activity: Goal: Risk for activity intolerance will decrease Outcome: Progressing   Problem: Nutrition: Goal: Adequate nutrition will be maintained Outcome: Progressing   Problem: Coping: Goal: Level of anxiety will decrease Outcome: Progressing   Problem: Elimination: Goal: Will not experience complications related to bowel motility Outcome: Progressing Goal: Will not experience complications related to urinary retention Outcome: Progressing   Problem: Pain Management: Goal: General experience of comfort will improve Outcome: Progressing   Problem: Safety: Goal: Ability to remain free from injury will improve Outcome: Progressing   Problem: Skin Integrity: Goal: Risk for impaired skin integrity will decrease Outcome: Progressing   Problem: Education: Goal: Knowledge of disease or condition will improve Outcome: Progressing Goal: Knowledge of the prescribed therapeutic regimen will improve Outcome: Progressing   Problem: Activity: Goal: Ability to tolerate increased activity will improve Outcome: Progressing Goal: Will verbalize the importance of balancing activity with adequate rest periods Outcome: Progressing   Problem:  Respiratory: Goal: Ability to maintain a clear airway will improve Outcome: Progressing Goal: Levels of oxygenation will improve Outcome: Progressing Goal: Ability to maintain adequate ventilation will improve Outcome: Progressing   Problem: Education: Goal: Ability to describe self-care measures that may prevent or decrease complications (Diabetes Survival Skills Education) will improve Outcome: Progressing   Problem: Coping: Goal: Ability to adjust to condition or change in health will improve Outcome: Progressing   Problem: Fluid Volume: Goal: Ability to maintain a balanced intake and output will improve Outcome: Progressing   Problem: Health Behavior/Discharge Planning: Goal: Ability to identify and utilize available resources and services will improve Outcome: Progressing Goal: Ability to manage health-related needs will improve Outcome: Progressing   Problem: Metabolic: Goal: Ability to maintain appropriate glucose levels will improve Outcome: Progressing   Problem: Nutritional: Goal: Maintenance of adequate nutrition will improve Outcome: Progressing Goal: Progress toward achieving an optimal weight will improve Outcome: Progressing   Problem: Skin Integrity: Goal: Risk for impaired skin integrity will decrease Outcome: Progressing   Problem: Tissue Perfusion: Goal: Adequacy of tissue perfusion will improve Outcome: Progressing   Problem: Activity: Goal: Ability to tolerate increased activity will improve Outcome: Progressing   Problem: Respiratory: Goal: Ability to maintain a clear airway and adequate ventilation will improve Outcome: Progressing   Problem: Role Relationship: Goal: Method of communication will improve Outcome: Progressing

## 2023-09-25 NOTE — Progress Notes (Signed)
Interventional Radiology Brief Note:  IR following for possible gastrostomy tube placement.  Patient with ongoing clinical instability, hypotensive requiring pressors and elevated WBC today. In addition, his most recent KUB 09/21/23 still shows ongoing ileus.  Discussed case with Dr. Fredia Sorrow, plan was for attempt at gastrostomy once stable without concerns for infection with hope that ileus would resolve and allow for percutaneous access, however patient continues with labile pressures and instability.    Will plan for CT Abdomen w/o to determine whether ileus improving and percutaneous attempt possible.   Loyce Dys, MS RD PA-C

## 2023-09-25 NOTE — Progress Notes (Addendum)
Discussed with Britton-Lee, NP about patient's increased heart rate. Already given PRN dose of 1.25 mg of Dilaudid, Twice now per [facility] and increased continuous rate to 3mg /ml with little reduction in heart rate. Unable to change Dilaudid rate for 2 hours per [facility] orders. Stated that this RN can give PRN dose of Versed per [facility] without waiting to max out Dilaudid rate.  2 mg of versed bose given per EMAR. Will address again in two hours per [facility] orders. Stated that if this does not reduce heart rate to inform her and she would instruct in the next steps. Patient does not have a fever, have recently turned patient to prevent breakdown or uncomfortable positioning.  Increased levo as needed to maintain blood pressure during bolus administration. Will continue to monitor.

## 2023-09-26 ENCOUNTER — Encounter
Admission: EM | Disposition: A | Discharge status: Skilled Nursing Facility | Payer: Self-pay | Source: Home / Self Care | Attending: Internal Medicine

## 2023-09-26 DIAGNOSIS — J9601 Acute respiratory failure with hypoxia: Secondary | ICD-10-CM | POA: Diagnosis not present

## 2023-09-26 DIAGNOSIS — J9602 Acute respiratory failure with hypercapnia: Secondary | ICD-10-CM | POA: Diagnosis not present

## 2023-09-26 DIAGNOSIS — J441 Chronic obstructive pulmonary disease with (acute) exacerbation: Secondary | ICD-10-CM | POA: Diagnosis not present

## 2023-09-26 LAB — CBC WITH DIFFERENTIAL/PLATELET
Abs Immature Granulocytes: 0.33 10*3/uL — ABNORMAL HIGH (ref 0.00–0.07)
Basophils Absolute: 0 10*3/uL (ref 0.0–0.1)
Basophils Relative: 0 %
Eosinophils Absolute: 0 10*3/uL (ref 0.0–0.5)
Eosinophils Relative: 0 %
HCT: 25.9 % — ABNORMAL LOW (ref 39.0–52.0)
Hemoglobin: 8.1 g/dL — ABNORMAL LOW (ref 13.0–17.0)
Immature Granulocytes: 2 %
Lymphocytes Relative: 3 %
Lymphs Abs: 0.7 10*3/uL (ref 0.7–4.0)
MCH: 30.7 pg (ref 26.0–34.0)
MCHC: 31.3 g/dL (ref 30.0–36.0)
MCV: 98.1 fL (ref 80.0–100.0)
Monocytes Absolute: 1.1 10*3/uL — ABNORMAL HIGH (ref 0.1–1.0)
Monocytes Relative: 5 %
Neutro Abs: 19 10*3/uL — ABNORMAL HIGH (ref 1.7–7.7)
Neutrophils Relative %: 90 %
Platelets: 328 10*3/uL (ref 150–400)
RBC: 2.64 MIL/uL — ABNORMAL LOW (ref 4.22–5.81)
RDW: 14.6 % (ref 11.5–15.5)
WBC: 21.2 10*3/uL — ABNORMAL HIGH (ref 4.0–10.5)
nRBC: 0 % (ref 0.0–0.2)

## 2023-09-26 LAB — APTT
aPTT: 40 s — ABNORMAL HIGH (ref 24–36)
aPTT: 48 s — ABNORMAL HIGH (ref 24–36)

## 2023-09-26 LAB — GLUCOSE, CAPILLARY
Glucose-Capillary: 107 mg/dL — ABNORMAL HIGH (ref 70–99)
Glucose-Capillary: 156 mg/dL — ABNORMAL HIGH (ref 70–99)
Glucose-Capillary: 169 mg/dL — ABNORMAL HIGH (ref 70–99)
Glucose-Capillary: 169 mg/dL — ABNORMAL HIGH (ref 70–99)
Glucose-Capillary: 186 mg/dL — ABNORMAL HIGH (ref 70–99)
Glucose-Capillary: 192 mg/dL — ABNORMAL HIGH (ref 70–99)

## 2023-09-26 LAB — PROTIME-INR
INR: 1.3 — ABNORMAL HIGH (ref 0.8–1.2)
Prothrombin Time: 16.5 s — ABNORMAL HIGH (ref 11.4–15.2)

## 2023-09-26 LAB — BASIC METABOLIC PANEL
Anion gap: 11 (ref 5–15)
BUN: 36 mg/dL — ABNORMAL HIGH (ref 8–23)
CO2: 32 mmol/L (ref 22–32)
Calcium: 8.9 mg/dL (ref 8.9–10.3)
Chloride: 94 mmol/L — ABNORMAL LOW (ref 98–111)
Creatinine, Ser: 0.56 mg/dL — ABNORMAL LOW (ref 0.61–1.24)
GFR, Estimated: >60 mL/min (ref >60)
Glucose, Bld: 217 mg/dL — ABNORMAL HIGH (ref 70–99)
Potassium: 4.2 mmol/L (ref 3.5–5.1)
Sodium: 137 mmol/L (ref 135–145)

## 2023-09-26 LAB — MAGNESIUM: Magnesium: 2.3 mg/dL (ref 1.7–2.4)

## 2023-09-26 LAB — PHOSPHORUS: Phosphorus: 2.1 mg/dL — ABNORMAL LOW (ref 2.5–4.6)

## 2023-09-26 LAB — HEPARIN LEVEL (UNFRACTIONATED)
Heparin Unfractionated: 0.15 [IU]/mL — ABNORMAL LOW (ref 0.30–0.70)
Heparin Unfractionated: 0.27 [IU]/mL — ABNORMAL LOW (ref 0.30–0.70)

## 2023-09-26 LAB — C-REACTIVE PROTEIN: CRP: 26.4 mg/dL — ABNORMAL HIGH (ref <1.0)

## 2023-09-26 SURGERY — TRACHEOSTOMY
Anesthesia: General

## 2023-09-26 MED ORDER — POTASSIUM & SODIUM PHOSPHATES 280-160-250 MG PO PACK
1.0000 | PACK | Freq: Three times a day (TID) | ORAL | Status: AC
  Administered 2023-09-26 (×4): 1
  Filled 2023-09-26 (×4): qty 1

## 2023-09-26 MED ORDER — HEPARIN BOLUS VIA INFUSION
1800.0000 [IU] | Freq: Once | INTRAVENOUS | Status: AC
  Administered 2023-09-26: 1800 [IU] via INTRAVENOUS
  Filled 2023-09-26: qty 1800

## 2023-09-26 MED ORDER — IPRATROPIUM-ALBUTEROL 0.5-2.5 (3) MG/3ML IN SOLN
3.0000 mL | Freq: Three times a day (TID) | RESPIRATORY_TRACT | Status: DC
Start: 2023-09-27 — End: 2023-10-06
  Administered 2023-09-27 – 2023-10-05 (×23): 3 mL via RESPIRATORY_TRACT
  Filled 2023-09-26 (×26): qty 3

## 2023-09-26 MED ORDER — HEPARIN (PORCINE) 25000 UT/250ML-% IV SOLN
1100.0000 [IU]/h | INTRAVENOUS | Status: DC
  Administered 2023-09-26: 900 [IU]/h via INTRAVENOUS
  Administered 2023-09-27 – 2023-09-29 (×3): 1100 [IU]/h via INTRAVENOUS
  Filled 2023-09-26 (×4): qty 250

## 2023-09-26 NOTE — TOC Progression Note (Signed)
Transition of Care [facility]) - Progression Note    Patient Details  Name: Benjamin Bolton MRN: [id number] Date of Birth: 1961-05-06  Transition of [facility] CM/SW Contact  Margarito Liner, LCSW Phone Number: 09/26/2023, 4:15 PM  Clinical Narrative:  TOC continues to follow progress.   Expected Discharge Plan and Services                                               Social Determinants of Health (SDOH) Interventions SDOH Screenings   Food Insecurity: No Food Insecurity (09/12/2023)  Recent Concern: Food Insecurity - Food Insecurity Present (08/25/2023)   Received from [facility]  Housing: Unknown (09/12/2023)  Transportation Needs: No Transportation Needs (09/12/2023)  Recent Concern: Transportation Needs - Unmet Transportation Needs (08/25/2023)   Received from [facility]  Utilities: Not At Risk (09/12/2023)  Recent Concern: Utilities - At Risk (07/23/2023)   Received from [facility]  Financial Resource Strain: Low Risk  (08/25/2023)   Received from [facility]  Recent Concern: Financial Resource Strain - Medium Risk (07/23/2023)   Received from [facility]  Physical Activity: Insufficiently Active (08/25/2023)   Received from [facility]  Social Connections: Socially Isolated (08/25/2023)   Received from [facility]  Stress: No Stress Concern Present (08/25/2023)   Received from [facility]  Tobacco Use: High Risk (09/25/2023)  Health Literacy: Inadequate Health Literacy (08/25/2023)   Received from [facility]    Readmission Risk Interventions     No data to display

## 2023-09-26 NOTE — Plan of Care (Signed)
Remains intubated and sedated, on Dilaudid and Versed; versed decreased from 2.5 to 2.0 mg per hour. Tolerating ventilator at current settings. Not following commands, but does respond to pain; reaches to mouth when receiving mouthcare. Slightly opened eyes when wife at bedside speaking to him. Afebrile this shift. External catheter in place. Tube feeds restarted at 20 cc per hour. Pressors off at this time. Heparin started at 900 units per hour. NSR on monitor.   Problem: Clinical Measurements: Goal: Ability to maintain clinical measurements within normal limits will improve Outcome: Progressing Goal: Will remain free from infection Outcome: Progressing Goal: Diagnostic test results will improve Outcome: Progressing Goal: Respiratory complications will improve Outcome: Progressing Goal: Cardiovascular complication will be avoided Outcome: Progressing   Problem: Activity: Goal: Risk for activity intolerance will decrease Outcome: Progressing   Problem: Nutrition: Goal: Adequate nutrition will be maintained Outcome: Progressing   Problem: Coping: Goal: Level of anxiety will decrease Outcome: Progressing   Problem: Elimination: Goal: Will not experience complications related to bowel motility Outcome: Progressing Goal: Will not experience complications related to urinary retention Outcome: Progressing   Problem: Pain Management: Goal: General experience of comfort will improve Outcome: Progressing   Problem: Safety: Goal: Ability to remain free from injury will improve Outcome: Progressing   Problem: Skin Integrity: Goal: Risk for impaired skin integrity will decrease Outcome: Progressing   Problem: Respiratory: Goal: Ability to maintain a clear airway will improve Outcome: Progressing Goal: Ability to maintain adequate ventilation will improve Outcome: Progressing

## 2023-09-26 NOTE — Progress Notes (Deleted)
Extubation order written. Cuff leak noted. Patient extubated and placed on 4L [place] Will continue to monitor.

## 2023-09-26 NOTE — Progress Notes (Signed)
IR continues to follow for potential gastrostomy tube placement. CT abdomen/pelvis was performed 09/25/23 per IR request. This shows a safe window to proceed with placement. IR will defer placement at this time due to concern for ongoing infection, fevers and leukocytosis.  The care of this patient was discussed with Dr. Donne Hazel. IR will consider placement once the patient is stable and concern for ongoing infection resolves.    Please contact IR with any questions/concerns.  Emoni Yang PA-C

## 2023-09-26 NOTE — Consult Note (Signed)
PHARMACY - ANTICOAGULATION CONSULT NOTE  Pharmacy Consult for IV Heparin Indication:  Hx DVT on apixaban prior to admission  Patient Measurements: Height: 6' (182.9 cm) Weight: 60.1 kg (132 lb 7.9 oz) IBW/kg (Calculated) : 77.6 Heparin Dosing Weight: 60.1 kg  Labs: Recent Labs    09/24/23 0418 09/25/23 0420 09/25/23 1204 09/26/23 0520 09/26/23 1307 09/26/23 2016  HGB 9.1* 9.1* 9.1* 8.1*  --   --   HCT 30.3* 30.0* 30.1* 25.9*  --   --   PLT 326 350 339 328  --   --   APTT  --   --   --   --  40* 48*  LABPROT  --   --  15.5*  --  16.5*  --   INR  --   --  1.2  --  1.3*  --   HEPARINUNFRC  --   --   --   --  0.15* 0.27*  CREATININE 0.45* 0.62  --  0.56*  --   --     Estimated Creatinine Clearance: 82.4 mL/min (A) (by C-G formula based on SCr of 0.56 mg/dL (L)).  Medical History: Past Medical History:  Diagnosis Date   COPD (chronic obstructive pulmonary disease) (HCC)    DVT (deep venous thrombosis) (HCC)    Dyspnea    GERD (gastroesophageal reflux disease)    no meds   HLD (hyperlipidemia)    Hx of migraines    Oxygen dependent    3 L [place]   Pneumonia 2023   Pre-diabetes    Pulmonary nodule    Rotator cuff tear, right    Tobacco abuse    Tobacco use     Medications:  Apixaban 5 mg BID, last dose 09/22/23  Assessment: 63 y/o M with medical history as above and including hx DVT on apixaban admitted with respiratory failure in setting of RSV. Has had prolonged admission with inability to wean from ventilator. Plan is for tracheostomy and PEG tube. Pharmacy consulted to initiate and manage heparin infusion while apixaban on hold in setting of need for procedures.  CBC reviewed, notable for anemia and worse today at 8.1 g/dL from 9.1 g/dL yesterday.  Baseline HL 0.15, INR 1.3, aPTT 40s  Labs Date Time Results Comments  1/24 2016 aPTT=48 HL=0.27 Heparin rate 900 units/hr->         Goal of Therapy:  Heparin level 0.3-0.7 units/ml aPTT 66 - 102  seconds Monitor platelets by anticoagulation protocol: Yes   Plan:  aPTT below goal and not correlating with HL. Will bolus with heparin 1800 units IV x 1, then increase infusion rate to 1100 units/hr. Check aPTT and HL 6 hours after rate change.  Daily CBC per protocol while on IV heparin  Maureen Delatte Rodriguez-Guzman PharmD, BCPS 09/26/2023 9:05 PM

## 2023-09-26 NOTE — Plan of Care (Signed)
Patient remains on AR-ICU at time of writing. Patient remains intubated, sedated. Vasopressors required again overnight (per prior orders) for hypotension. PICC remains in RUE. Patient remains at this time on active infusions of Levophed, Dilaudid, Versed, and Heparin. Patient will open eyes to vice, but does not follow commands. Patient withdraws from noxious stimuli but does not localize (BUE and BLE). Enteral feeds via NGT, up-titrating per orders. Plan for trach and PEG in near future.   Problem: Education: Goal: Knowledge of General Education information will improve Description: Including pain rating scale, medication(s)/side effects and non-pharmacologic comfort measures Outcome: Not Progressing   Problem: Health Behavior/Discharge Planning: Goal: Ability to manage health-related needs will improve Outcome: Not Progressing   Problem: Clinical Measurements: Goal: Ability to maintain clinical measurements within normal limits will improve Outcome: Not Progressing Goal: Will remain free from infection Outcome: Not Progressing Goal: Diagnostic test results will improve Outcome: Not Progressing Goal: Respiratory complications will improve Outcome: Not Progressing Goal: Cardiovascular complication will be avoided Outcome: Not Progressing   Problem: Activity: Goal: Risk for activity intolerance will decrease Outcome: Not Progressing   Problem: Nutrition: Goal: Adequate nutrition will be maintained Outcome: Not Progressing   Problem: Coping: Goal: Level of anxiety will decrease Outcome: Not Progressing   Problem: Elimination: Goal: Will not experience complications related to bowel motility Outcome: Not Progressing Goal: Will not experience complications related to urinary retention Outcome: Not Progressing   Problem: Pain Management: Goal: General experience of comfort will improve Outcome: Not Progressing   Problem: Safety: Goal: Ability to remain free from injury will  improve Outcome: Not Progressing   Problem: Skin Integrity: Goal: Risk for impaired skin integrity will decrease Outcome: Not Progressing   Problem: Education: Goal: Knowledge of disease or condition will improve Outcome: Not Progressing Goal: Knowledge of the prescribed therapeutic regimen will improve Outcome: Not Progressing   Problem: Activity: Goal: Ability to tolerate increased activity will improve Outcome: Not Progressing Goal: Will verbalize the importance of balancing activity with adequate rest periods Outcome: Not Progressing   Problem: Respiratory: Goal: Ability to maintain a clear airway will improve Outcome: Not Progressing Goal: Levels of oxygenation will improve Outcome: Not Progressing Goal: Ability to maintain adequate ventilation will improve Outcome: Not Progressing   Problem: Education: Goal: Ability to describe self-care measures that may prevent or decrease complications (Diabetes Survival Skills Education) will improve Outcome: Not Progressing   Problem: Coping: Goal: Ability to adjust to condition or change in health will improve Outcome: Not Progressing   Problem: Fluid Volume: Goal: Ability to maintain a balanced intake and output will improve Outcome: Not Progressing   Problem: Health Behavior/Discharge Planning: Goal: Ability to identify and utilize available resources and services will improve Outcome: Not Progressing Goal: Ability to manage health-related needs will improve Outcome: Not Progressing   Problem: Metabolic: Goal: Ability to maintain appropriate glucose levels will improve Outcome: Not Progressing   Problem: Nutritional: Goal: Maintenance of adequate nutrition will improve Outcome: Not Progressing Goal: Progress toward achieving an optimal weight will improve Outcome: Not Progressing   Problem: Skin Integrity: Goal: Risk for impaired skin integrity will decrease Outcome: Not Progressing   Problem: Tissue  Perfusion: Goal: Adequacy of tissue perfusion will improve Outcome: Not Progressing   Problem: Activity: Goal: Ability to tolerate increased activity will improve Outcome: Not Progressing   Problem: Respiratory: Goal: Ability to maintain a clear airway and adequate ventilation will improve Outcome: Not Progressing   Problem: Role Relationship: Goal: Method of communication will improve Outcome: Not Progressing

## 2023-09-26 NOTE — Progress Notes (Signed)
Nutrition Follow-up  DOCUMENTATION CODES:   Non-severe (moderate) malnutrition in context of chronic illness  INTERVENTION:   -TF via OGT:    Vital 1.5 @ 20 ml/hr and increase by 10 ml every 8 hours to goal rate of 55 ml/hr.    30 ml free water flush every 4 hours   Tube feeding regimen provides 1931 kcals, 99 grams of protein, and 1250 ml of H2O.     -Monitor Mg, K, and Phos and replete as needed secondary to high refeeding risk -Continue MVI with minerals daily per tube  NUTRITION DIAGNOSIS:   Moderate Malnutrition related to chronic illness (pt sedated and ventilated) as evidenced by moderate fat depletion, moderate muscle depletion.  Ongoing  GOAL:   Provide needs based on ASPEN/SCCM guidelines  Progressing   MONITOR:   Vent status, Labs, Weight trends, TF tolerance, Skin, I & O's  REASON FOR ASSESSMENT:   Consult Enteral/tube feeding initiation and management  ASSESSMENT:   63 y/o male with h/o COPD, DVT, HLD, GERD, pulmonary nodule and DM who is admitted with RSV, pneumonia and COPD exacerbation.  Patient is currently intubated on ventilator support. Per KUB on 09/26/23, tip of tube "extends through the stomach and terminates near the pylorus".  MV: 6.6 L/min Temp (24hrs), Avg:98.8 F (37.1 C), Min:97.9 F (36.6 C), Max:101 F (38.3 C)  Reviewed I/O's: -960 ml x 24 hours and +5.8 L since 09/12/23  UOP: 2.3 L x 24 hours  Per IR notes, CT of abdomen and pelvis reviewed and reveals safe window to proceed with placement. IR deferring placement currently due to ongoing infection, fevers, and leukocytosis.   Case discussed with MD, RN, and during ICU rounds. Trach and PEG currently on hold. Reviewed permission to re-start TF today. Per MD, pt has improved since yesterday.   Wt has been stable over the past week.   Labs reviewed: Phos: 2.1 (on supplementation), CBGS: 138-169 (inpatient orders for glycemic control are none).    Diet Order:   Diet Order              Diet NPO time specified  Diet effective midnight                   EDUCATION NEEDS:   No education needs have been identified at this time  Skin:  Skin Assessment: Reviewed RN Assessment  Last BM:  09/18/23 (type 6)  Height:   Ht Readings from Last 1 Encounters:  09/10/23 6' (1.829 m)    Weight:   Wt Readings from Last 1 Encounters:  09/26/23 60.1 kg    Ideal Body Weight:  80.9 kg  BMI:  Body mass index is 17.97 kg/m.  Estimated Nutritional Needs:   Kcal:  1808  Protein:  90-100g/day  Fluid:  1.7-1.9L/day    Levada Schilling, RD, LDN, CDCES Registered Dietitian III Certified Diabetes Care and Education Specialist If unable to reach this RD, please use "RD Inpatient" group chat on secure chat between hours of 8am-4 pm daily

## 2023-09-26 NOTE — Progress Notes (Signed)
NAME:  Benjamin Bolton, MRN:  [id number], DOB:  December 24, 1960, LOS: 16 ADMISSION DATE:  09/10/2023, CONSULTATION DATE: 09/13/2023  Brief Pt Description / Synopsis:  63 y.o. 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   History of Present Illness:  Benjamin Bolton is a 63 year old male with a history of severe COPD followed by [facility] Pulmonology who presents to the hospital from [facility] clinic on 1/8 with increased shortness of breath. He was admitted to the medicine service for management of COPD exacerbation and found to have an RSV infection.   Patient was admitted to [facility] and initiated on nebulizers, steroids, and antibiotics. He was managed medically for COPD exacerbation and UTI but developed worsening respiratory distress this AM requiring the initiation of BiPAP. Rapid response was called and he was transferred to the ICU. Initial blood gas showed pH of 7.2, with repeat VBG showing he remained acidemic at 7.18. Given altered mental status and respiratory failure, decision made at the bedside to proceed with emergent intubation.  Pertinent  Medical History  DVT on Eliquis COPD on 2L O2 Type II Diabetes Mellitus  GERD HLD Pulmonary Nodule Tobacco Abuse   Micro Data:  1/8: SARS-CoV-2 PCR>>negative 1/8: Blood culture x2>> no growth 1/8: Urine>> multiple species (suggest recollection) 1/9: RVP>> + RSV 1/9: Sputum>> normal respiratory flora 1/9: HIV Screen>> non reactive 1/11: MRSA PCR>> negative 1/19: MRSA PCR>> 1/19: RVP>>  Antimicrobials:   Anti-infectives (From admission, onward)    Start     Dose/Rate Route Frequency Ordered Stop   09/25/23 1200  linezolid (ZYVOX) IVPB 600 mg        600 mg 300 mL/hr over 60 Minutes Intravenous Every 12 hours 09/25/23 0918     09/25/23 1015   ceFEPIme (MAXIPIME) 2 g in sodium chloride 0.9 % 100 mL IVPB        2 g 200 mL/hr over 30 Minutes Intravenous Every 8 hours 09/25/23 0918     09/25/23 0000  ceFAZolin (ANCEF) IVPB 2g/100 mL premix        2 g 200 mL/hr over 30 Minutes Intravenous  Once 09/23/23 1103 09/25/23 0013   09/22/23 0010  vancomycin (VANCOCIN) IVPB 750 mg/150 ml premix  Status:  Discontinued        750 mg 150 mL/hr over 60 Minutes Intravenous Every 12 hours 09/22/23 0004 09/22/23 1039   09/22/23 0000  Vancomycin (VANCOCIN) 750 mg IVPB  Status:  Discontinued        750 mg 150 mL/hr over 60 Minutes Intravenous Every 12 hours 09/21/23 1120 09/21/23 2356   09/22/23 0000  vancomycin (VANCOREADY) IVPB 750 mg/150 mL  Status:  Discontinued        750 mg 150 mL/hr over 60 Minutes Intravenous Every 12 hours 09/21/23 2356 09/22/23 0004   09/21/23 1300  vancomycin (VANCOCIN) IVPB 1000 mg/200 mL premix        1,000 mg 200 mL/hr over 60 Minutes Intravenous  Once 09/21/23 1120 09/21/23 1900   09/21/23 1215  piperacillin-tazobactam (ZOSYN) IVPB 3.375 g  Status:  Discontinued        3.375 g 12.5 mL/hr over 240 Minutes Intravenous Every 8 hours 09/21/23 1120 09/23/23 1257   09/21/23 1130  vancomycin (VANCOREADY) IVPB 1250 mg/250 mL  Status:  Discontinued  1,250 mg 166.7 mL/hr over 90 Minutes Intravenous  Once 09/21/23 1046 09/21/23 1120   09/21/23 1100  piperacillin-tazobactam (ZOSYN) IVPB 3.375 g  Status:  Discontinued        3.375 g 12.5 mL/hr over 240 Minutes Intravenous  Once 09/21/23 1046 09/21/23 1152   09/13/23 1445  piperacillin-tazobactam (ZOSYN) IVPB 3.375 g        3.375 g 12.5 mL/hr over 240 Minutes Intravenous Every 8 hours 09/13/23 1351 09/17/23 0157   09/10/23 2315  cefTRIAXone (ROCEPHIN) 1 g in sodium chloride 0.9 % 100 mL IVPB  Status:  Discontinued        1 g 200 mL/hr over 30 Minutes Intravenous Every 24 hours 09/10/23 2305 09/13/23 1336      Significant Hospital Events: Including procedures,  antibiotic start and stop dates in addition to other pertinent events   1/08: admit to [facility] 1/11: rapid response, transfer to ICU, intubated 1/12: remains vented, sedated, does not follow commands.  Failed SBT  1/13: Overnight due to concerns of aspiration TF's held.  Will repeat SBT today 1/14: Overnight pt had possible seizure activity with rhythmic tremors noted in the mouth/lower face/neck during episode pt unresponsive to pain.  Received 2 mg of versed and 2g keppra bolus with resolution of symptoms.  CT Head negative.  Standing dose of keppra 1g bid.  Neurology consulted.    1/15: On minimal vent settings, unable to tolerate WUA due to increased WOB. Shifting measures given for Hyperkalemia of 5.5 with peaked T waves.  Abdomen distended, KUB without evidence of obstruction, give Lactulose.  Diurese with 40 mg IV Lasix x1. 1/16: Failed WUA, with increased WOB and hypoxic with O2 sats dropping to mid 70's.  Add Seroquel.  Palliative Care consulted to assist with GOC. 1/17: Precedex started for WUA.  Failed SBT due to increased WOB, tachypnea, and hypoxia.  Increase Seroquel to 50 mg BID. Transition Heparin to Elqiuis 1/18: Pt remains mechanically intubated and has failed multiple SBT's.  Planning for family meeting today at bedside with palliative care and PCCM will perform SBT once family arrives at bedside.  Remains sedated with propofol and fentanyl gtts.  Family meeting held code status changed to DNR per family request.  Pts family trying to decide if they want to proceed with tracheostomy.  Required additional sedation and prn vecuronium overnight due to vent dyssynchrony  1/19: Pt febrile overnight with increased vasopressor requirements levophed gtt @12  mcg/min and febrile tmax 101 degrees F concerning for recurrent sepsis.  Broad spectrum abx started. Pt remains mechanically intubated and unable to successfully liberate from ventilator  1/20 remains on vent, severe rap failure 1/21 remains on  vent prolonged exp phase air trapping  on vent 1/22 remains on vent 1/23 CT ABD- Colonic ileus pattern shows significant improvement since the prior radiograph with no further significant gaseous distension of the colon. 1/23 CT chest B/L Lower lobe pneumonia severe emphysema 1/23 CT head-possible cerebral edema 1/23 remains on vent failure to wean   Interim History / Subjective:  Remains critically ill Remains intubated Severe hypoxia Requires VENT support for survival Severe end stage COPD Needs TRACH AND PEG TUBE TO SURVIVE +fevers PEG and TRACH pending clinical improvement  Vent Mode: PRVC FiO2 (%):  [40 %-55 %] 40 % Set Rate:  [12 bmp] 12 bmp Vt Set:  [570 mL] 570 mL PEEP:  [8 cmH20] 8 cmH20 Plateau Pressure:  [18 cmH20-25 cmH20] 18 cmH20     Objective   Blood pressure  135/82, pulse 71, temperature (!) 101 F (38.3 C), temperature source Rectal, resp. rate 12, height 6' (1.829 m), weight 60.1 kg, SpO2 100%.    Vent Mode: PRVC FiO2 (%):  [40 %-55 %] 40 % Set Rate:  [12 bmp] 12 bmp Vt Set:  [570 mL] 570 mL PEEP:  [8 cmH20] 8 cmH20 Plateau Pressure:  [18 cmH20-25 cmH20] 18 cmH20   Intake/Output Summary (Last 24 hours) at 09/26/2023 0746 Last data filed at 09/26/2023 1610 Gross per 24 hour  Intake 1324.72 ml  Output 2300 ml  Net -975.28 ml   Filed Weights   09/24/23 0451 09/25/23 0429 09/26/23 0500  Weight: 60.5 kg 58.5 kg 60.1 kg       REVIEW OF SYSTEMS  PATIENT IS UNABLE TO PROVIDE COMPLETE REVIEW OF SYSTEMS DUE TO SEVERE CRITICAL ILLNESS   PHYSICAL EXAMINATION:  GENERAL:critically ill appearing, +resp distress EYES: Pupils equal, round, reactive to light.  No scleral icterus.  MOUTH: Moist mucosal membrane. INTUBATED NECK: Supple.  PULMONARY: Lungs clear to auscultation, +rhonchi, +wheezing CARDIOVASCULAR: S1 and S2.  Regular rate and rhythm GASTROINTESTINAL: Soft, nontender, -distended. Positive bowel sounds.  MUSCULOSKELETAL: + edema.   NEUROLOGIC: obtunded,sedated SKIN:normal, warm to touch, Capillary refill delayed  Pulses present bilaterally       Assessment & Plan:  63 yo AAM with Acute  metabolic encephalopathy due to severe hypoxic and hypercapnic resp failure due to severe COPD exacerbation with sepsis and RSV pneumonia complicated by demand ischemia  Severe ACUTE Hypoxic and Hypercapnic Respiratory Failure -continue Mechanical Ventilator support -Wean Fio2 and PEEP as tolerated -VAP/VENT bundle implementation - Wean PEEP & FiO2 as tolerated, maintain SpO2 > 88% - Head of bed elevated 30 degrees, VAP protocol in place - Plateau pressures less than 30 cm H20  - Intermittent chest x-ray & ABG PRN Failure to wean from vent Needs TRACH to survive   NEUROLOGY ACUTE METABOLIC ENCEPHALOPATHY MRI BRAIN PENDING ASSESS FOR EDEMA -need for sedation -Goal RASS -2 to -3 - Continue thiamine and mvi  - Keppra was discontinued - Seizure precautions   SEVERE COPD EXACERBATION -continue IV steroids as prescribed -continue NEB THERAPY as prescribed    CARDIAC  Mildly elevated troponin secondary to demand ischemia  Hypotension suspect secondary to possible sepsis and sedating medication  HOLD ALL ATIPLT AND AC therapy for prcodeures Hx: HLD and DVT on Eliquis Echo 09/14/23: 50 to 55%, indeterminate diastolic parameters - Cardiology signed off no further cardiac evaluations indicated at this time    INFECTIOUS DISEASE -continue antibiotics as prescribed -follow up cultures Fevers CT chest +pneumonia   ACUTE ANEMIA- TRANSFUSE AS NEEDED CONSIDER TRANSFUSION  IF HGB<7    ENDO - ICU hypoglycemic\Hyperglycemia protocol -check FSBS per protocol   GI GI PROPHYLAXIS as indicated  NUTRITIONAL STATUS DIET-->TF's as tolerated CT abd shows improving ileus Constipation protocol as indicated   ELECTROLYTES -follow labs as needed -replace as needed -pharmacy consultation and following  Pt is  critically ill with AECOPD in the setting of RSV and pneumonia.  Anticipate difficulty in liberating from mechanical ventilation.  May end up needing Tracheostomy.  Scheduled meeting on Monday 09/22/23 at 1300  Best Practice (right click and "Reselect all SmartList Selections" daily)   Diet/type: TF's DVT prophylaxis Apixaban  Pressure ulcer(s): N/A GI prophylaxis: Pepcid  Lines: RUE PICC Line and still needed  Foley:  External Catheter  Code Status: DNR Last date of multidisciplinary goals of care discussion [09/22/2023]  01/20: proceed with trach and PEG tube Labs  CBC: Recent Labs  Lab 09/23/23 0519 09/24/23 0418 09/25/23 0420 09/25/23 1204 09/26/23 0520  WBC 8.5 13.2* 18.0* 21.4* 21.2*  NEUTROABS  --   --  15.1*  --  19.0*  HGB 9.1* 9.1* 9.1* 9.1* 8.1*  HCT 29.3* 30.3* 30.0* 30.1* 25.9*  MCV 98.0 101.7* 102.0* 101.3* 98.1  PLT 294 326 350 339 328    Basic Metabolic Panel: Recent Labs  Lab 09/21/23 0333 09/22/23 0400 09/23/23 0519 09/24/23 0418 09/25/23 0420 09/26/23 0520  NA 133* 136 138 139 137 137  K 4.1 4.1 3.8 4.9 4.3 4.2  CL 91* 95* 92* 93* 95* 94*  CO2 32 34* 36* 37* 33* 32  GLUCOSE 169* 142* 152* 209* 174* 217*  BUN 30* 26* 31* 34* 36* 36*  CREATININE 0.51* 0.45* 0.60* 0.45* 0.62 0.56*  CALCIUM 8.4* 8.4* 8.6* 8.9 8.3* 8.9  MG  --  2.1  --  2.2 2.4 2.3  PHOS 3.2 2.6  --  2.6 2.8 2.1*   GFR: Estimated Creatinine Clearance: 82.4 mL/min (A) (by C-G formula based on SCr of 0.56 mg/dL (L)). Recent Labs  Lab 09/21/23 0333 09/22/23 0400 09/24/23 0418 09/25/23 0420 09/25/23 1204 09/25/23 1821 09/26/23 0520  PROCALCITON 1.55  --   --   --   --  3.95  --   WBC 9.1   < > 13.2* 18.0* 21.4*  --  21.2*  LATICACIDVEN 1.0  --   --   --   --   --   --    < > = values in this interval not displayed.    Liver Function Tests: Recent Labs  Lab 09/20/23 0406 09/21/23 0333 09/22/23 0400  ALBUMIN 2.5* 2.5* 2.2*   No results for input(s): "LIPASE",  "AMYLASE" in the last 168 hours. No results for input(s): "AMMONIA" in the last 168 hours.  ABG    Component Value Date/Time   PHART 7.43 09/20/2023 2332   PCO2ART 62 (H) 09/20/2023 2332   PO2ART 53 (L) 09/20/2023 2332   HCO3 41.2 (H) 09/20/2023 2332   O2SAT 86.6 09/20/2023 2332     Coagulation Profile: Recent Labs  Lab 09/22/23 1427 09/25/23 1204  INR 1.1 1.2     Cardiac Enzymes: No results for input(s): "CKTOTAL", "CKMB", "CKMBINDEX", "TROPONINI" in the last 168 hours.  HbA1C: Hgb A1c MFr Bld  Date/Time Value Ref Range Status  09/15/2023 09:24 PM 6.4 (H) 4.8 - 5.6 % Final    Comment:    (NOTE) Pre diabetes:          5.7%-6.4%  Diabetes:              >6.4%  Glycemic control for   <7.0% adults with diabetes     CBG: Recent Labs  Lab 09/25/23 1638 09/25/23 2011 09/25/23 2356 09/26/23 0350 09/26/23 0735  GLUCAP 138* 196* 162* 186* 169*    Review of Systems:   Unable to assess pt mechanically intubated   Past Medical History:  He,  has a past medical history of COPD (chronic obstructive pulmonary disease) (HCC), DVT (deep venous thrombosis) (HCC), Dyspnea, GERD (gastroesophageal reflux disease), HLD (hyperlipidemia), migraines, Oxygen dependent, Pneumonia (2023), Pre-diabetes, Pulmonary nodule, Rotator cuff tear, right, Tobacco abuse, and Tobacco use.   Surgical History:   Past Surgical History:  Procedure Laterality Date   CHEST TUBE INSERTION     SHOULDER ARTHROSCOPY WITH SUBACROMIAL DECOMPRESSION, ROTATOR CUFF REPAIR AND BICEP TENDON REPAIR Right 02/18/2023   Procedure: RIGHT SHOULDER ARTHROSCOPY WITH DEBRIDEMENT, DECOMPRESSION, ROTATOR  CUFF REPAIR, BICEPS TENODESIS.;  Surgeon: Christena Flake, MD;  Location: [facility] ORS;  Service: Orthopedics;  Laterality: Right;     Social History:   reports that he has been smoking cigarettes. He has a 12 pack-year smoking history. He has never used smokeless tobacco. He reports current alcohol use. He reports that he  does not use drugs.   Family History:  His family history is not on file.   Allergies No Active Allergies   Home Medications  Prior to Admission medications   Medication Sig Start Date End Date Taking? Authorizing Provider  albuterol (PROVENTIL) (2.5 MG/3ML) 0.083% nebulizer solution USE 1 VIAL IN NEBULIZER EVERY 6 HOURS AS NEEDED FOR WHEEZING 06/28/21  Yes [provider]  albuterol (VENTOLIN HFA) 108 (90 Base) MCG/ACT inhaler Inhale 1 puff into the lungs every 4 (four) hours as needed for wheezing or shortness of breath.   Yes [provider]  APIXABAN Everlene Balls) VTE STARTER PACK (10MG  AND 5MG ) Take as directed on package: start with two-5mg  tablets twice daily for 7 days. On day 8, switch to one-5mg  tablet twice daily. 02/26/23  Yes Merwyn Katos, MD  ondansetron (ZOFRAN) 4 MG tablet Take 1 tablet (4 mg total) by mouth every 6 (six) hours as needed for nausea. 02/19/23  Yes Anson Oregon, PA-C  oxyCODONE (OXY IR/ROXICODONE) 5 MG immediate release tablet Take 1 tablet by mouth 2 (two) times daily as needed. 08/29/23  Yes [provider]  OXYGEN Inhale 3 L into the lungs continuous.   Yes [provider]  predniSONE (DELTASONE) 10 MG tablet Take 2-6 tablets by mouth daily with breakfast.  ORIGINAL JJO:ACZY 6 tabs daily x4 days; then 4 tabs daily x4 days; then 2 tabs daily x4 days.   Yes [provider]  predniSONE (DELTASONE) 20 MG tablet Take 20 mg by mouth daily with breakfast.   Yes [provider]  TRELEGY ELLIPTA 100-62.5-25 MCG/ACT AEPB Inhale 1 puff into the lungs every morning. 07/04/21  Yes [provider]  amoxicillin-clavulanate (AUGMENTIN) 875-125 MG tablet Take 1 tablet by mouth every 12 (twelve) hours. Patient not taking: Reported on 09/10/2023    [provider]  benralizumab (FASENRA PEN) 30 MG/ML prefilled autoinjector Inject 30 mg into the skin as directed. Patient not taking: Reported on 09/10/2023  07/23/23   [provider]  nystatin (MYCOSTATIN) 100000 UNIT/ML suspension Take 5 mLs by mouth 4 (four) times daily. Patient not taking: Reported on 09/10/2023 09/04/23   [provider]  omeprazole (PRILOSEC) 20 MG capsule Take 20 mg by mouth daily. Patient not taking: Reported on 09/10/2023    [provider]  oxyCODONE (OXY IR/ROXICODONE) 5 MG immediate release tablet Take 1-2 tablets (5-10 mg total) by mouth every 4 (four) hours as needed for severe pain. 02/19/23   Anson Oregon, PA-C      DVT/GI PRX  assessed I Assessed the need for Labs I Assessed the need for Foley I Assessed the need for Central Venous Line Family Discussion when available I Assessed the need for Mobilization I made an Assessment of medications to be adjusted accordingly Safety Risk assessment completed  CASE DISCUSSED IN MULTIDISCIPLINARY ROUNDS WITH ICU TEAM   Patient with multiorgan failure MRI pending to assess edema Patient with very poor chance of meaningful recovery if MRI and physical exam confirms brain damage  Critical Care Time devoted to patient care services described in this note is 65 minutes.  Critical care was necessary to treat /  prevent imminent and life-threatening deterioration. Overall, patient is critically ill, prognosis is guarded.  Patient with Multiorgan failure and at high risk for cardiac arrest and death.    Lucie Leather, M.D.  [facility]  Medical Director [facility] Medical Director [facility] Cardio-Pulmonary Department

## 2023-09-26 NOTE — Consult Note (Signed)
PHARMACY - ANTICOAGULATION CONSULT NOTE  Pharmacy Consult for IV Heparin Indication:  Hx DVT on apixaban prior to admission  Patient Measurements: Height: 6' (182.9 cm) Weight: 60.1 kg (132 lb 7.9 oz) IBW/kg (Calculated) : 77.6 Heparin Dosing Weight: 60.1 kg  Labs: Recent Labs    09/24/23 0418 09/25/23 0420 09/25/23 1204 09/26/23 0520  HGB 9.1* 9.1* 9.1* 8.1*  HCT 30.3* 30.0* 30.1* 25.9*  PLT 326 350 339 328  LABPROT  --   --  15.5*  --   INR  --   --  1.2  --   CREATININE 0.45* 0.62  --  0.56*    Estimated Creatinine Clearance: 82.4 mL/min (A) (by C-G formula based on SCr of 0.56 mg/dL (L)).  Medical History: Past Medical History:  Diagnosis Date   COPD (chronic obstructive pulmonary disease) (HCC)    DVT (deep venous thrombosis) (HCC)    Dyspnea    GERD (gastroesophageal reflux disease)    no meds   HLD (hyperlipidemia)    Hx of migraines    Oxygen dependent    3 L [place]   Pneumonia 2023   Pre-diabetes    Pulmonary nodule    Rotator cuff tear, right    Tobacco abuse    Tobacco use     Medications:  Apixaban 5 mg BID, last dose 09/22/23  Assessment: 63 y/o M with medical history as above and including hx DVT on apixaban admitted with respiratory failure in setting of RSV. Has had prolonged admission with inability to wean from ventilator. Plan is for tracheostomy and PEG tube. Pharmacy consulted to initiate and manage heparin infusion while apixaban on hold in setting of need for procedures.  CBC reviewed, notable for anemia and worse today at 8.1 g/dL from 9.1 g/dL yesterday.  Baseline HL 0.15, INR 1.3, aPTT 40s  Goal of Therapy:  Heparin level 0.3-0.7 units/ml aPTT 66 - 102 seconds Monitor platelets by anticoagulation protocol: Yes   Plan:  --Start heparin infusion at 900 units/hr --Baseline HL slightly elevated. aPTT elevated as well. Will check both aPTT and HL 6 hours after initiation. Can probably follow HL soon given appears apixaban effect mostly  washed out --Daily CBC per protocol while on IV heparin  Tressie Ellis 09/26/2023,12:27 PM

## 2023-09-27 DIAGNOSIS — J9601 Acute respiratory failure with hypoxia: Secondary | ICD-10-CM | POA: Diagnosis not present

## 2023-09-27 DIAGNOSIS — J441 Chronic obstructive pulmonary disease with (acute) exacerbation: Secondary | ICD-10-CM | POA: Diagnosis not present

## 2023-09-27 DIAGNOSIS — G9341 Metabolic encephalopathy: Secondary | ICD-10-CM | POA: Diagnosis not present

## 2023-09-27 DIAGNOSIS — J9602 Acute respiratory failure with hypercapnia: Secondary | ICD-10-CM | POA: Diagnosis not present

## 2023-09-27 LAB — GLUCOSE, CAPILLARY
Glucose-Capillary: 115 mg/dL — ABNORMAL HIGH (ref 70–99)
Glucose-Capillary: 222 mg/dL — ABNORMAL HIGH (ref 70–99)
Glucose-Capillary: 192 mg/dL — ABNORMAL HIGH (ref 70–99)
Glucose-Capillary: 128 mg/dL — ABNORMAL HIGH (ref 70–99)
Glucose-Capillary: 157 mg/dL — ABNORMAL HIGH (ref 70–99)
Glucose-Capillary: 161 mg/dL — ABNORMAL HIGH (ref 70–99)

## 2023-09-27 LAB — BASIC METABOLIC PANEL
Anion gap: 11 (ref 5–15)
BUN: 36 mg/dL — ABNORMAL HIGH (ref 8–23)
CO2: 35 mmol/L — ABNORMAL HIGH (ref 22–32)
Calcium: 9.2 mg/dL (ref 8.9–10.3)
Chloride: 94 mmol/L — ABNORMAL LOW (ref 98–111)
Creatinine, Ser: 0.47 mg/dL — ABNORMAL LOW (ref 0.61–1.24)
GFR, Estimated: >60 mL/min (ref >60)
Glucose, Bld: 132 mg/dL — ABNORMAL HIGH (ref 70–99)
Potassium: 3.7 mmol/L (ref 3.5–5.1)
Sodium: 140 mmol/L (ref 135–145)

## 2023-09-27 LAB — CBC
HCT: 26.7 % — ABNORMAL LOW (ref 39.0–52.0)
Hemoglobin: 8.2 g/dL — ABNORMAL LOW (ref 13.0–17.0)
MCH: 30.6 pg (ref 26.0–34.0)
MCHC: 30.7 g/dL (ref 30.0–36.0)
MCV: 99.6 fL (ref 80.0–100.0)
Platelets: 314 10*3/uL (ref 150–400)
RBC: 2.68 MIL/uL — ABNORMAL LOW (ref 4.22–5.81)
RDW: 14.6 % (ref 11.5–15.5)
WBC: 17.1 10*3/uL — ABNORMAL HIGH (ref 4.0–10.5)
nRBC: 0.4 % — ABNORMAL HIGH (ref 0.0–0.2)

## 2023-09-27 LAB — CULTURE, RESPIRATORY W GRAM STAIN: Culture: NORMAL

## 2023-09-27 LAB — APTT: aPTT: 64 s — ABNORMAL HIGH (ref 24–36)

## 2023-09-27 LAB — PHOSPHORUS: Phosphorus: 1.7 mg/dL — ABNORMAL LOW (ref 2.5–4.6)

## 2023-09-27 LAB — HEPARIN LEVEL (UNFRACTIONATED)
Heparin Unfractionated: 0.49 [IU]/mL (ref 0.30–0.70)
Heparin Unfractionated: 0.48 [IU]/mL (ref 0.30–0.70)

## 2023-09-27 MED ORDER — POTASSIUM PHOSPHATES 15 MMOLE/5ML IV SOLN
30.0000 mmol | Freq: Once | INTRAVENOUS | Status: DC
  Filled 2023-09-27: qty 10

## 2023-09-27 MED ORDER — SODIUM CHLORIDE 0.9 % IV SOLN: INTRAVENOUS | Status: AC | PRN

## 2023-09-27 MED ORDER — DEXTROSE 5 % IV SOLN
30.0000 mmol | Freq: Once | INTRAVENOUS | Status: AC
  Administered 2023-09-27: 30 mmol via INTRAVENOUS
  Filled 2023-09-27: qty 10

## 2023-09-27 NOTE — Progress Notes (Signed)
NAME:  Benjamin Bolton, MRN:  [id number], DOB:  1961/08/07, LOS: 17 ADMISSION DATE:  09/10/2023, CONSULTATION DATE: 09/13/2023  Brief Pt Description / Synopsis:  63 y.o. 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   History of Present Illness:  Benjamin Bolton is a 63 year old male with a history of severe COPD followed by [facility] who presents to the hospital from [facility] clinic on 1/8 with increased shortness of breath. He was admitted to the medicine service for management of COPD exacerbation and found to have an RSV infection.   Patient was admitted to [facility] and initiated on nebulizers, steroids, and antibiotics. He was managed medically for COPD exacerbation and UTI but developed worsening respiratory distress this AM requiring the initiation of BiPAP. Rapid response was called and he was transferred to the ICU. Initial blood gas showed pH of 7.2, with repeat VBG showing he remained acidemic at 7.18. Given altered mental status and respiratory failure, decision made at the bedside to proceed with emergent intubation.  Pertinent  Medical History  DVT on Eliquis COPD on 2L O2 Type II Diabetes Mellitus  GERD HLD Pulmonary Nodule Tobacco Abuse   Micro Data:  1/8: SARS-CoV-2 PCR>>negative 1/8: Blood culture x2>> no growth 1/8: Urine>> multiple species (suggest recollection) 1/9: RVP>> + RSV 1/9: Sputum>> normal respiratory flora 1/9: HIV Screen>> non reactive 1/11: MRSA PCR>> negative 1/19: MRSA PCR>> 1/19: RVP>>  Antimicrobials:   Anti-infectives (From admission, onward)    Start     Dose/Rate Route Frequency Ordered Stop   09/25/23 1200  linezolid (ZYVOX) IVPB 600 mg  Status:  Discontinued        600 mg 300 mL/hr over 60 Minutes Intravenous Every 12 hours 09/25/23  0918 09/26/23 1206   09/25/23 1015  ceFEPIme (MAXIPIME) 2 g in sodium chloride 0.9 % 100 mL IVPB        2 g 200 mL/hr over 30 Minutes Intravenous Every 8 hours 09/25/23 0918     09/25/23 0000  ceFAZolin (ANCEF) IVPB 2g/100 mL premix        2 g 200 mL/hr over 30 Minutes Intravenous  Once 09/23/23 1103 09/25/23 0013   09/22/23 0010  vancomycin (VANCOCIN) IVPB 750 mg/150 ml premix  Status:  Discontinued        750 mg 150 mL/hr over 60 Minutes Intravenous Every 12 hours 09/22/23 0004 09/22/23 1039   09/22/23 0000  Vancomycin (VANCOCIN) 750 mg IVPB  Status:  Discontinued        750 mg 150 mL/hr over 60 Minutes Intravenous Every 12 hours 09/21/23 1120 09/21/23 2356   09/22/23 0000  vancomycin (VANCOREADY) IVPB 750 mg/150 mL  Status:  Discontinued        750 mg 150 mL/hr over 60 Minutes Intravenous Every 12 hours 09/21/23 2356 09/22/23 0004   09/21/23 1300  vancomycin (VANCOCIN) IVPB 1000 mg/200 mL premix        1,000 mg 200 mL/hr over 60 Minutes Intravenous  Once 09/21/23 1120 09/21/23 1900   09/21/23 1215  piperacillin-tazobactam (ZOSYN) IVPB 3.375 g  Status:  Discontinued        3.375 g 12.5 mL/hr over 240 Minutes Intravenous Every 8 hours 09/21/23 1120 09/23/23 1257   09/21/23 1130  vancomycin (VANCOREADY) IVPB 1250 mg/250 mL  Status:  Discontinued  1,250 mg 166.7 mL/hr over 90 Minutes Intravenous  Once 09/21/23 1046 09/21/23 1120   09/21/23 1100  piperacillin-tazobactam (ZOSYN) IVPB 3.375 g  Status:  Discontinued        3.375 g 12.5 mL/hr over 240 Minutes Intravenous  Once 09/21/23 1046 09/21/23 1152   09/13/23 1445  piperacillin-tazobactam (ZOSYN) IVPB 3.375 g        3.375 g 12.5 mL/hr over 240 Minutes Intravenous Every 8 hours 09/13/23 1351 09/17/23 0157   09/10/23 2315  cefTRIAXone (ROCEPHIN) 1 g in sodium chloride 0.9 % 100 mL IVPB  Status:  Discontinued        1 g 200 mL/hr over 30 Minutes Intravenous Every 24 hours 09/10/23 2305 09/13/23 1336      Significant Hospital  Events: Including procedures, antibiotic start and stop dates in addition to other pertinent events   1/08: admit to [facility] 1/11: rapid response, transfer to ICU, intubated 1/12: remains vented, sedated, does not follow commands.  Failed SBT  1/13: Overnight due to concerns of aspiration TF's held.  Will repeat SBT today 1/14: Overnight pt had possible seizure activity with rhythmic tremors noted in the mouth/lower face/neck during episode pt unresponsive to pain.  Received 2 mg of versed and 2g keppra bolus with resolution of symptoms.  CT Head negative.  Standing dose of keppra 1g bid.  Neurology consulted.    1/15: On minimal vent settings, unable to tolerate WUA due to increased WOB. Shifting measures given for Hyperkalemia of 5.5 with peaked T waves.  Abdomen distended, KUB without evidence of obstruction, give Lactulose.  Diurese with 40 mg IV Lasix x1. 1/16: Failed WUA, with increased WOB and hypoxic with O2 sats dropping to mid 70's.  Add Seroquel.  Palliative Care consulted to assist with GOC. 1/17: Precedex started for WUA.  Failed SBT due to increased WOB, tachypnea, and hypoxia.  Increase Seroquel to 50 mg BID. Transition Heparin to Elqiuis 1/18: Pt remains mechanically intubated and has failed multiple SBT's.  Planning for family meeting today at bedside with palliative care and PCCM will perform SBT once family arrives at bedside.  Remains sedated with propofol and fentanyl gtts.  Family meeting held code status changed to DNR per family request.  Pts family trying to decide if they want to proceed with tracheostomy.  Required additional sedation and prn vecuronium overnight due to vent dyssynchrony  1/19: Pt febrile overnight with increased vasopressor requirements levophed gtt @12  mcg/min and febrile tmax 101 degrees F concerning for recurrent sepsis.  Broad spectrum abx started. Pt remains mechanically intubated and unable to successfully liberate from ventilator  1/20 remains on vent,  severe rap failure 1/21 remains on vent prolonged exp phase air trapping  on vent 1/22 remains on vent 1/23 CT ABD- Colonic ileus pattern shows significant improvement since the prior radiograph with no further significant gaseous distension of the colon. 1/23 CT chest B/L Lower lobe pneumonia severe emphysema 1/23 CT head-possible cerebral edema 1/23 remains on vent failure to wean 1/24 remains intubated, severe hypoxia, shock   Interim History / Subjective:  Remains critically ill Remains intubated Severe hypoxia Requires VENT support for survival Severe end stage COPD Needs TRACH AND PEG TUBE TO SURVIVE +fevers PEG and TRACH pending clinical improvement  Vent Mode: PRVC FiO2 (%):  [35 %-40 %] 35 % Set Rate:  [12 bmp] 12 bmp Vt Set:  [570 mL] 570 mL PEEP:  [5 cmH20-8 cmH20] 5 cmH20 Plateau Pressure:  [15 cmH20] 15 cmH20  Objective   Blood pressure 121/83, pulse 79, temperature 97.7 F (36.5 C), temperature source Axillary, resp. rate 12, height 6' (1.829 m), weight 58.4 kg, SpO2 99%. CVP:  [5 mmHg] 5 mmHg  Vent Mode: PRVC FiO2 (%):  [35 %-40 %] 35 % Set Rate:  [12 bmp] 12 bmp Vt Set:  [570 mL] 570 mL PEEP:  [5 cmH20-8 cmH20] 5 cmH20 Plateau Pressure:  [15 cmH20] 15 cmH20   Intake/Output Summary (Last 24 hours) at 09/27/2023 0720 Last data filed at 09/27/2023 0600 Gross per 24 hour  Intake 1666.18 ml  Output 870 ml  Net 796.18 ml   Filed Weights   09/25/23 0429 09/26/23 0500 09/27/23 0440  Weight: 58.5 kg 60.1 kg 58.4 kg      REVIEW OF SYSTEMS  PATIENT IS UNABLE TO PROVIDE COMPLETE REVIEW OF SYSTEMS DUE TO SEVERE CRITICAL ILLNESS   PHYSICAL EXAMINATION:  GENERAL:critically ill appearing, +resp distress EYES: Pupils equal, round, reactive to light.  No scleral icterus.  MOUTH: Moist mucosal membrane. INTUBATED NECK: Supple.  PULMONARY: Lungs clear to auscultation, +rhonchi, +wheezing CARDIOVASCULAR: S1 and S2.  Regular rate and  rhythm GASTROINTESTINAL: Soft, nontender, -distended. Positive bowel sounds.  MUSCULOSKELETAL: No swelling, clubbing, or edema.  NEUROLOGIC: obtunded,sedated SKIN:normal, warm to touch, Capillary refill delayed  Pulses present bilaterally       Assessment & Plan:  63 yo AAM with Acute  metabolic encephalopathy due to severe hypoxic and hypercapnic resp failure due to severe COPD exacerbation with sepsis and RSV pneumonia complicated by demand ischemia  Severe ACUTE Hypoxic and Hypercapnic Respiratory Failure -continue Mechanical Ventilator support -Wean Fio2 and PEEP as tolerated -VAP/VENT bundle implementation - Wean PEEP & FiO2 as tolerated, maintain SpO2 > 88% - Head of bed elevated 30 degrees, VAP protocol in place - Plateau pressures less than 30 cm H20  - Intermittent chest x-ray & ABG PRN - Ensure adequate pulmonary hygiene  Failure to wean from vent Needs TRACH to survive   NEUROLOGY ACUTE METABOLIC ENCEPHALOPATHY -need for sedation -Goal RASS -2 to -3 - Continue thiamine and mvi  - Keppra was discontinued - Seizure precautions    SEVERE COPD EXACERBATION -continue IV steroids as prescribed -continue NEB THERAPY as prescribed CT CHEST REVIEWED SEVERE EMPHYSEMA   CARDIAC  Mildly elevated troponin secondary to demand ischemia  Hypotension suspect secondary to possible sepsis and sedating medication  HOLD ALL ATIPLT AND AC therapy for prcodeures Hx: HLD and DVT on Eliquis Echo 09/14/23: 50 to 55%, indeterminate diastolic parameters - Cardiology signed off no further cardiac evaluations indicated at this time   INFECTIOUS DISEASE -continue antibiotics as prescribed -follow up cultures -follow up ID consultation CT chest +pneumonia  ACUTE ANEMIA- TRANSFUSE AS NEEDED CONSIDER TRANSFUSION  IF HGB<7   ENDO - ICU hypoglycemic\Hyperglycemia protocol -check FSBS per protocol   GI GI PROPHYLAXIS as indicated  NUTRITIONAL STATUS DIET-->TF's as  tolerated Constipation protocol as indicated CT ABD ILEUS IMPROVING  ELECTROLYTES -follow labs as needed -replace as needed -pharmacy consultation and following    Pt is critically ill with AECOPD in the setting of RSV and pneumonia.  Anticipate difficulty in liberating from mechanical ventilation.  May end up needing Tracheostomy.  Scheduled meeting on Monday 09/22/23 at 1300  Best Practice (right click and "Reselect all SmartList Selections" daily)   Diet/type: TF's DVT prophylaxis Apixaban  Pressure ulcer(s): N/A GI prophylaxis: Pepcid  Lines: RUE PICC Line and still needed  Foley:  External Catheter  Code Status: DNR Last date of  multidisciplinary goals of care discussion [09/22/2023]  01/20: proceed with trach and PEG tube Labs   CBC: Recent Labs  Lab 09/24/23 0418 09/25/23 0420 09/25/23 1204 09/26/23 0520 09/27/23 0352  WBC 13.2* 18.0* 21.4* 21.2* 17.1*  NEUTROABS  --  15.1*  --  19.0*  --   HGB 9.1* 9.1* 9.1* 8.1* 8.2*  HCT 30.3* 30.0* 30.1* 25.9* 26.7*  MCV 101.7* 102.0* 101.3* 98.1 99.6  PLT 326 350 339 328 314    Basic Metabolic Panel: Recent Labs  Lab 09/22/23 0400 09/23/23 0519 09/24/23 0418 09/25/23 0420 09/26/23 0520 09/27/23 0352  NA 136 138 139 137 137 140  K 4.1 3.8 4.9 4.3 4.2 3.7  CL 95* 92* 93* 95* 94* 94*  CO2 34* 36* 37* 33* 32 35*  GLUCOSE 142* 152* 209* 174* 217* 132*  BUN 26* 31* 34* 36* 36* 36*  CREATININE 0.45* 0.60* 0.45* 0.62 0.56* 0.47*  CALCIUM 8.4* 8.6* 8.9 8.3* 8.9 9.2  MG 2.1  --  2.2 2.4 2.3  --   PHOS 2.6  --  2.6 2.8 2.1* 1.7*   GFR: Estimated Creatinine Clearance: 80.1 mL/min (A) (by C-G formula based on SCr of 0.47 mg/dL (L)). Recent Labs  Lab 09/21/23 0333 09/22/23 0400 09/25/23 0420 09/25/23 1204 09/25/23 1821 09/26/23 0520 09/27/23 0352  PROCALCITON 1.55  --   --   --  3.95  --   --   WBC 9.1   < > 18.0* 21.4*  --  21.2* 17.1*  LATICACIDVEN 1.0  --   --   --   --   --   --    < > = values in this  interval not displayed.    Liver Function Tests: Recent Labs  Lab 09/21/23 0333 09/22/23 0400  ALBUMIN 2.5* 2.2*   No results for input(s): "LIPASE", "AMYLASE" in the last 168 hours. No results for input(s): "AMMONIA" in the last 168 hours.  ABG    Component Value Date/Time   PHART 7.43 09/20/2023 2332   PCO2ART 62 (H) 09/20/2023 2332   PO2ART 53 (L) 09/20/2023 2332   HCO3 41.2 (H) 09/20/2023 2332   O2SAT 86.6 09/20/2023 2332     Coagulation Profile: Recent Labs  Lab 09/22/23 1427 09/25/23 1204 09/26/23 1307  INR 1.1 1.2 1.3*     Cardiac Enzymes: No results for input(s): "CKTOTAL", "CKMB", "CKMBINDEX", "TROPONINI" in the last 168 hours.  HbA1C: Hgb A1c MFr Bld  Date/Time Value Ref Range Status  09/15/2023 09:24 PM 6.4 (H) 4.8 - 5.6 % Final    Comment:    (NOTE) Pre diabetes:          5.7%-6.4%  Diabetes:              >6.4%  Glycemic control for   <7.0% adults with diabetes     CBG: Recent Labs  Lab 09/26/23 1123 09/26/23 1523 09/26/23 1936 09/26/23 2352 09/27/23 0349  GLUCAP 169* 107* 156* 192* 115*    Review of Systems:   Unable to assess pt mechanically intubated   Past Medical History:  He,  has a past medical history of COPD (chronic obstructive pulmonary disease) (HCC), DVT (deep venous thrombosis) (HCC), Dyspnea, GERD (gastroesophageal reflux disease), HLD (hyperlipidemia), migraines, Oxygen dependent, Pneumonia (2023), Pre-diabetes, Pulmonary nodule, Rotator cuff tear, right, Tobacco abuse, and Tobacco use.   Surgical History:   Past Surgical History:  Procedure Laterality Date   CHEST TUBE INSERTION     SHOULDER ARTHROSCOPY WITH SUBACROMIAL DECOMPRESSION, ROTATOR CUFF  REPAIR AND BICEP TENDON REPAIR Right 02/18/2023   Procedure: RIGHT SHOULDER ARTHROSCOPY WITH DEBRIDEMENT, DECOMPRESSION, ROTATOR CUFF REPAIR, BICEPS TENODESIS.;  Surgeon: Christena Flake, MD;  Location: [facility] ORS;  Service: Orthopedics;  Laterality: Right;     Social  History:   reports that he has been smoking cigarettes. He has a 12 pack-year smoking history. He has never used smokeless tobacco. He reports current alcohol use. He reports that he does not use drugs.   Family History:  His family history is not on file.   Allergies No Active Allergies   Home Medications  Prior to Admission medications   Medication Sig Start Date End Date Taking? Authorizing Provider  albuterol (PROVENTIL) (2.5 MG/3ML) 0.083% nebulizer solution USE 1 VIAL IN NEBULIZER EVERY 6 HOURS AS NEEDED FOR WHEEZING 06/28/21  Yes [provider]  albuterol (VENTOLIN HFA) 108 (90 Base) MCG/ACT inhaler Inhale 1 puff into the lungs every 4 (four) hours as needed for wheezing or shortness of breath.   Yes [provider]  APIXABAN Everlene Balls) VTE STARTER PACK (10MG  AND 5MG ) Take as directed on package: start with two-5mg  tablets twice daily for 7 days. On day 8, switch to one-5mg  tablet twice daily. 02/26/23  Yes Merwyn Katos, MD  ondansetron (ZOFRAN) 4 MG tablet Take 1 tablet (4 mg total) by mouth every 6 (six) hours as needed for nausea. 02/19/23  Yes Anson Oregon, PA-C  oxyCODONE (OXY IR/ROXICODONE) 5 MG immediate release tablet Take 1 tablet by mouth 2 (two) times daily as needed. 08/29/23  Yes [provider]  OXYGEN Inhale 3 L into the lungs continuous.   Yes [provider]  predniSONE (DELTASONE) 10 MG tablet Take 2-6 tablets by mouth daily with breakfast.  ORIGINAL UJW:JXBJ 6 tabs daily x4 days; then 4 tabs daily x4 days; then 2 tabs daily x4 days.   Yes [provider]  predniSONE (DELTASONE) 20 MG tablet Take 20 mg by mouth daily with breakfast.   Yes [provider]  TRELEGY ELLIPTA 100-62.5-25 MCG/ACT AEPB Inhale 1 puff into the lungs every morning. 07/04/21  Yes [provider]  amoxicillin-clavulanate (AUGMENTIN) 875-125 MG tablet Take 1 tablet by mouth every 12 (twelve) hours. Patient not taking: Reported on  09/10/2023    [provider]  benralizumab (FASENRA PEN) 30 MG/ML prefilled autoinjector Inject 30 mg into the skin as directed. Patient not taking: Reported on 09/10/2023 07/23/23   [provider]  nystatin (MYCOSTATIN) 100000 UNIT/ML suspension Take 5 mLs by mouth 4 (four) times daily. Patient not taking: Reported on 09/10/2023 09/04/23   [provider]  omeprazole (PRILOSEC) 20 MG capsule Take 20 mg by mouth daily. Patient not taking: Reported on 09/10/2023    [provider]  oxyCODONE (OXY IR/ROXICODONE) 5 MG immediate release tablet Take 1-2 tablets (5-10 mg total) by mouth every 4 (four) hours as needed for severe pain. 02/19/23   Anson Oregon, PA-C       DVT/GI PRX  assessed I Assessed the need for Labs I Assessed the need for Foley I Assessed the need for Central Venous Line Family Discussion when available I Assessed the need for Mobilization I made an Assessment of medications to be adjusted accordingly Safety Risk assessment completed  CASE DISCUSSED IN MULTIDISCIPLINARY ROUNDS WITH ICU TEAM    Patient with multiorgan failure Patient with very poor chance of meaningful recovery   Critical Care Time devoted to patient care services described in this note is 55 minutes.  Critical care was necessary to treat /prevent imminent and life-threatening deterioration. Overall, patient is critically ill, prognosis is guarded.  Patient with Multiorgan failure and at high risk for cardiac arrest and death.    Lucie Leather, M.D.  [facility]  Medical Director [facility] Medical Director [facility] Cardio-Pulmonary Department

## 2023-09-27 NOTE — Consult Note (Signed)
PHARMACY - ANTICOAGULATION CONSULT NOTE  Pharmacy Consult for IV Heparin Indication:  Hx DVT on apixaban prior to admission  Patient Measurements: Height: 6' (182.9 cm) Weight: 58.4 kg (128 lb 12 oz) IBW/kg (Calculated) : 77.6 Heparin Dosing Weight: 60.1 kg  Labs: Recent Labs    09/25/23 0420 09/25/23 1204 09/26/23 0520 09/26/23 1307 09/26/23 1307 09/26/23 2016 09/27/23 0352 09/27/23 1139  HGB 9.1* 9.1* 8.1*  --   --   --  8.2*  --   HCT 30.0* 30.1* 25.9*  --   --   --  26.7*  --   PLT 350 339 328  --   --   --  314  --   APTT  --   --   --  40*  --  48* 64*  --   LABPROT  --  15.5*  --  16.5*  --   --   --   --   INR  --  1.2  --  1.3*  --   --   --   --   HEPARINUNFRC  --   --   --  0.15*   < > 0.27* 0.49 0.48  CREATININE 0.62  --  0.56*  --   --   --  0.47*  --    < > = values in this interval not displayed.    Estimated Creatinine Clearance: 80.1 mL/min (A) (by C-G formula based on SCr of 0.47 mg/dL (L)).  Medical History: Past Medical History:  Diagnosis Date   COPD (chronic obstructive pulmonary disease) (HCC)    DVT (deep venous thrombosis) (HCC)    Dyspnea    GERD (gastroesophageal reflux disease)    no meds   HLD (hyperlipidemia)    Hx of migraines    Oxygen dependent    3 L [place]   Pneumonia 2023   Pre-diabetes    Pulmonary nodule    Rotator cuff tear, right    Tobacco abuse    Tobacco use     Medications:  Apixaban 5 mg BID, last dose 09/22/23  Assessment: 63 y/o M with medical history as above and including hx DVT on apixaban admitted with respiratory failure in setting of RSV. Has had prolonged admission with inability to wean from ventilator. Plan is for tracheostomy and PEG tube. Pharmacy consulted to initiate and manage heparin infusion while apixaban on hold in setting of need for procedures.  CBC reviewed, notable for anemia and worse today at 8.1 g/dL from 9.1 g/dL yesterday.  Baseline HL 0.15, INR 1.3, aPTT 40s  Labs Date Time Results  Comments  1/24 2016 aPTT=48 HL=0.27 Heparin rate 900 units/hr->  1/25 0352 HL 0.49 Therapeutic x 1  1/25 1139 HL 0.48 Therapeutic x 2    Goal of Therapy:  Heparin level 0.3-0.7 units/ml aPTT 66 - 102 seconds Monitor platelets by anticoagulation protocol: Yes   Plan:  Continue infusion rate at 1100 units/hr. Recheck HL with morning labs Daily CBC per protocol while on IV heparin  Bettey Costa, PharmD Clinical Pharmacist 09/27/2023 12:28 PM

## 2023-09-27 NOTE — Consult Note (Signed)
PHARMACY - ANTICOAGULATION CONSULT NOTE  Pharmacy Consult for IV Heparin Indication:  Hx DVT on apixaban prior to admission  Patient Measurements: Height: 6' (182.9 cm) Weight: 58.4 kg (128 lb 12 oz) IBW/kg (Calculated) : 77.6 Heparin Dosing Weight: 60.1 kg  Labs: Recent Labs    09/25/23 0420 09/25/23 1204 09/26/23 0520 09/26/23 1307 09/26/23 2016 09/27/23 0352  HGB 9.1* 9.1* 8.1*  --   --  8.2*  HCT 30.0* 30.1* 25.9*  --   --  26.7*  PLT 350 339 328  --   --  314  APTT  --   --   --  40* 48* 64*  LABPROT  --  15.5*  --  16.5*  --   --   INR  --  1.2  --  1.3*  --   --   HEPARINUNFRC  --   --   --  0.15* 0.27* 0.49  CREATININE 0.62  --  0.56*  --   --  0.47*    Estimated Creatinine Clearance: 80.1 mL/min (A) (by C-G formula based on SCr of 0.47 mg/dL (L)).  Medical History: Past Medical History:  Diagnosis Date   COPD (chronic obstructive pulmonary disease) (HCC)    DVT (deep venous thrombosis) (HCC)    Dyspnea    GERD (gastroesophageal reflux disease)    no meds   HLD (hyperlipidemia)    Hx of migraines    Oxygen dependent    3 L    Pneumonia 2023   Pre-diabetes    Pulmonary nodule    Rotator cuff tear, right    Tobacco abuse    Tobacco use     Medications:  Apixaban 5 mg BID, last dose 09/22/23  Assessment: 63 y/o M with medical history as above and including hx DVT on apixaban admitted with respiratory failure in setting of RSV. Has had prolonged admission with inability to wean from ventilator. Plan is for tracheostomy and PEG tube. Pharmacy consulted to initiate and manage heparin infusion while apixaban on hold in setting of need for procedures.  CBC reviewed, notable for anemia and worse today at 8.1 g/dL from 9.1 g/dL yesterday.  Baseline HL 0.15, INR 1.3, aPTT 40s  Labs Date Time Results Comments  1/24 2016 aPTT=48 HL=0.27 Heparin rate 900 units/hr->  1/25 0352 HL 0.49 Therapeutic x 1    Goal of Therapy:  Heparin level 0.3-0.7  units/ml aPTT 66 - 102 seconds Monitor platelets by anticoagulation protocol: Yes   Plan:  Continue infusion rate at 1100 units/hr. Recheck HL in 6 hours to confirm Daily CBC per protocol while on IV heparin  Otelia Sergeant, PharmD, [facility] 09/27/2023 4:47 AM

## 2023-09-27 NOTE — Progress Notes (Signed)
Sent tubes (red and blue) via tube station to station number 1 for RN to collect 10 AM draw from line.

## 2023-09-28 DIAGNOSIS — J9601 Acute respiratory failure with hypoxia: Secondary | ICD-10-CM | POA: Diagnosis not present

## 2023-09-28 DIAGNOSIS — G9341 Metabolic encephalopathy: Secondary | ICD-10-CM | POA: Diagnosis not present

## 2023-09-28 DIAGNOSIS — J9602 Acute respiratory failure with hypercapnia: Secondary | ICD-10-CM | POA: Diagnosis not present

## 2023-09-28 DIAGNOSIS — J441 Chronic obstructive pulmonary disease with (acute) exacerbation: Secondary | ICD-10-CM | POA: Diagnosis not present

## 2023-09-28 LAB — GLUCOSE, CAPILLARY
Glucose-Capillary: 134 mg/dL — ABNORMAL HIGH (ref 70–99)
Glucose-Capillary: 151 mg/dL — ABNORMAL HIGH (ref 70–99)
Glucose-Capillary: 179 mg/dL — ABNORMAL HIGH (ref 70–99)
Glucose-Capillary: 212 mg/dL — ABNORMAL HIGH (ref 70–99)
Glucose-Capillary: 221 mg/dL — ABNORMAL HIGH (ref 70–99)
Glucose-Capillary: 224 mg/dL — ABNORMAL HIGH (ref 70–99)
Glucose-Capillary: 227 mg/dL — ABNORMAL HIGH (ref 70–99)

## 2023-09-28 LAB — CBC
HCT: 27.9 % — ABNORMAL LOW (ref 39.0–52.0)
Hemoglobin: 8.5 g/dL — ABNORMAL LOW (ref 13.0–17.0)
MCH: 30.1 pg (ref 26.0–34.0)
MCHC: 30.5 g/dL (ref 30.0–36.0)
MCV: 98.9 fL (ref 80.0–100.0)
Platelets: 319 10*3/uL (ref 150–400)
RBC: 2.82 MIL/uL — ABNORMAL LOW (ref 4.22–5.81)
RDW: 14.7 % (ref 11.5–15.5)
WBC: 13.3 10*3/uL — ABNORMAL HIGH (ref 4.0–10.5)
nRBC: 0.2 % (ref 0.0–0.2)

## 2023-09-28 LAB — BASIC METABOLIC PANEL
Anion gap: 8 (ref 5–15)
BUN: 31 mg/dL — ABNORMAL HIGH (ref 8–23)
CO2: 35 mmol/L — ABNORMAL HIGH (ref 22–32)
Calcium: 9.2 mg/dL (ref 8.9–10.3)
Chloride: 99 mmol/L (ref 98–111)
Creatinine, Ser: 0.47 mg/dL — ABNORMAL LOW (ref 0.61–1.24)
GFR, Estimated: >60 mL/min (ref >60)
Glucose, Bld: 208 mg/dL — ABNORMAL HIGH (ref 70–99)
Potassium: 4.6 mmol/L (ref 3.5–5.1)
Sodium: 142 mmol/L (ref 135–145)

## 2023-09-28 LAB — PHOSPHORUS: Phosphorus: 2.7 mg/dL (ref 2.5–4.6)

## 2023-09-28 LAB — HEPARIN LEVEL (UNFRACTIONATED): Heparin Unfractionated: 0.44 [IU]/mL (ref 0.30–0.70)

## 2023-09-28 LAB — MAGNESIUM: Magnesium: 2.3 mg/dL (ref 1.7–2.4)

## 2023-09-28 MED ORDER — SODIUM CHLORIDE 0.9 % IV SOLN
0.5000 mg/h | INTRAVENOUS | Status: DC
  Administered 2023-09-28: 2.5 mg/h via INTRAVENOUS
  Filled 2023-09-28 (×2): qty 5

## 2023-09-28 NOTE — Plan of Care (Signed)
Problem: Health Behavior/Discharge Planning: Goal: Ability to manage health-related needs will improve Outcome: Progressing   Problem: Clinical Measurements: Goal: Ability to maintain clinical measurements within normal limits will improve Outcome: Progressing Goal: Respiratory complications will improve Outcome: Progressing Goal: Cardiovascular complication will be avoided Outcome: Progressing

## 2023-09-28 NOTE — Plan of Care (Signed)
  Problem: Clinical Measurements: Goal: Ability to maintain clinical measurements within normal limits will improve Outcome: Progressing Goal: Will remain free from infection Outcome: Progressing Goal: Diagnostic test results will improve Outcome: Progressing Goal: Respiratory complications will improve Outcome: Not Progressing Goal: Cardiovascular complication will be avoided Outcome: Progressing   Problem: Nutrition: Goal: Adequate nutrition will be maintained Outcome: Progressing   Problem: Pain Management: Goal: General experience of comfort will improve Outcome: Progressing   Problem: Safety: Goal: Ability to remain free from injury will improve Outcome: Progressing

## 2023-09-28 NOTE — Consult Note (Signed)
PHARMACY - ANTICOAGULATION CONSULT NOTE  Pharmacy Consult for IV Heparin Indication:  Hx DVT on apixaban prior to admission  Patient Measurements: Height: 6' (182.9 cm) Weight: 59 kg (130 lb 1.1 oz) IBW/kg (Calculated) : 77.6 Heparin Dosing Weight: 60.1 kg  Labs: Recent Labs    09/25/23 1204 09/26/23 0520 09/26/23 1307 09/26/23 1307 09/26/23 2016 09/27/23 0352 09/27/23 1139 09/28/23 0409 09/28/23 0410  HGB 9.1* 8.1*  --   --   --  8.2*  --   --  8.5*  HCT 30.1* 25.9*  --   --   --  26.7*  --   --  27.9*  PLT 339 328  --   --   --  314  --   --  319  APTT  --   --  40*  --  48* 64*  --   --   --   LABPROT 15.5*  --  16.5*  --   --   --   --   --   --   INR 1.2  --  1.3*  --   --   --   --   --   --   HEPARINUNFRC  --   --  0.15*   < > 0.27* 0.49 0.48 0.44  --   CREATININE  --  0.56*  --   --   --  0.47*  --  0.47*  --    < > = values in this interval not displayed.    Estimated Creatinine Clearance: 80.9 mL/min (A) (by C-G formula based on SCr of 0.47 mg/dL (L)).  Medical History: Past Medical History:  Diagnosis Date   COPD (chronic obstructive pulmonary disease) (HCC)    DVT (deep venous thrombosis) (HCC)    Dyspnea    GERD (gastroesophageal reflux disease)    no meds   HLD (hyperlipidemia)    Hx of migraines    Oxygen dependent    3 L [place]   Pneumonia 2023   Pre-diabetes    Pulmonary nodule    Rotator cuff tear, right    Tobacco abuse    Tobacco use     Medications:  Apixaban 5 mg BID, last dose 09/22/23  Assessment: 63 y/o M with medical history as above and including hx DVT on apixaban admitted with respiratory failure in setting of RSV. Has had prolonged admission with inability to wean from ventilator. Plan is for tracheostomy and PEG tube. Pharmacy consulted to initiate and manage heparin infusion while apixaban on hold in setting of need for procedures.  CBC reviewed, notable for anemia and worse today at 8.1 g/dL from 9.1 g/dL yesterday.  Baseline  HL 0.15, INR 1.3, aPTT 40s  Labs Date Time Results Comments  1/24 2016 aPTT=48 HL=0.27 Heparin rate 900 units/hr->  1/25 0352 HL 0.49 Therapeutic x 1  1/26 0409 HL 0.44 Therapeutic x 2    Goal of Therapy:  Heparin level 0.3-0.7 units/ml aPTT 66 - 102 seconds Monitor platelets by anticoagulation protocol: Yes   Plan:  Continue infusion rate at 1100 units/hr. Recheck HL with morning labs Daily CBC per protocol while on IV heparin  Otelia Sergeant, PharmD, [facility] 09/28/2023 4:48 AM

## 2023-09-28 NOTE — Progress Notes (Signed)
NAME:  Benjamin Bolton, MRN:  [id number], DOB:  1961-01-15, LOS: 18 ADMISSION DATE:  09/10/2023, CONSULTATION DATE: 09/13/2023  Brief Pt Description / Synopsis:  63 y.o. 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 SEVERE END STAGE COPD FAILURE TO WEAN FROM VENT NEEDS TRACH AND PEG TUBE TO SURVIVE  Pertinent  Medical History  DVT on Eliquis COPD on 2L O2 Type II Diabetes Mellitus  GERD HLD Pulmonary Nodule Tobacco Abuse   Micro Data:  1/8: SARS-CoV-2 PCR>>negative 1/8: Blood culture x2>> no growth 1/8: Urine>> multiple species (suggest recollection) 1/9: RVP>> + RSV 1/9: Sputum>> normal respiratory flora 1/9: HIV Screen>> non reactive 1/11: MRSA PCR>> negative 1/19: MRSA PCR>> 1/19: RVP>>  Antimicrobials:   Anti-infectives (From admission, onward)    Start     Dose/Rate Route Frequency Ordered Stop   09/25/23 1200  linezolid (ZYVOX) IVPB 600 mg  Status:  Discontinued        600 mg 300 mL/hr over 60 Minutes Intravenous Every 12 hours 09/25/23 0918 09/26/23 1206   09/25/23 1015  ceFEPIme (MAXIPIME) 2 g in sodium chloride 0.9 % 100 mL IVPB        2 g 200 mL/hr over 30 Minutes Intravenous Every 8 hours 09/25/23 0918     09/25/23 0000  ceFAZolin (ANCEF) IVPB 2g/100 mL premix        2 g 200 mL/hr over 30 Minutes Intravenous  Once 09/23/23 1103 09/25/23 0013   09/22/23 0010  vancomycin (VANCOCIN) IVPB 750 mg/150 ml premix  Status:  Discontinued        750 mg 150 mL/hr over 60 Minutes Intravenous Every 12 hours 09/22/23 0004 09/22/23 1039   09/22/23 0000  Vancomycin (VANCOCIN) 750 mg IVPB  Status:  Discontinued        750 mg 150 mL/hr over 60 Minutes Intravenous Every 12 hours 09/21/23 1120 09/21/23 2356   09/22/23 0000  vancomycin (VANCOREADY) IVPB 750 mg/150 mL   Status:  Discontinued        750 mg 150 mL/hr over 60 Minutes Intravenous Every 12 hours 09/21/23 2356 09/22/23 0004   09/21/23 1300  vancomycin (VANCOCIN) IVPB 1000 mg/200 mL premix        1,000 mg 200 mL/hr over 60 Minutes Intravenous  Once 09/21/23 1120 09/21/23 1900   09/21/23 1215  piperacillin-tazobactam (ZOSYN) IVPB 3.375 g  Status:  Discontinued        3.375 g 12.5 mL/hr over 240 Minutes Intravenous Every 8 hours 09/21/23 1120 09/23/23 1257   09/21/23 1130  vancomycin (VANCOREADY) IVPB 1250 mg/250 mL  Status:  Discontinued        1,250 mg 166.7 mL/hr over 90 Minutes Intravenous  Once 09/21/23 1046 09/21/23 1120   09/21/23 1100  piperacillin-tazobactam (ZOSYN) IVPB 3.375 g  Status:  Discontinued        3.375 g 12.5 mL/hr over 240 Minutes Intravenous  Once 09/21/23 1046 09/21/23 1152   09/13/23 1445  piperacillin-tazobactam (ZOSYN) IVPB 3.375 g        3.375 g 12.5 mL/hr over 240 Minutes Intravenous Every 8 hours 09/13/23 1351 09/17/23 0157   09/10/23 2315  cefTRIAXone (ROCEPHIN) 1 g in sodium chloride 0.9 % 100 mL IVPB  Status:  Discontinued        1 g 200 mL/hr over 30 Minutes Intravenous Every  24 hours 09/10/23 2305 09/13/23 1336      Significant Hospital Events: Including procedures, antibiotic start and stop dates in addition to other pertinent events   1/08: admit to [facility] 1/11: rapid response, transfer to ICU, intubated 1/12: remains vented, sedated, does not follow commands.  Failed SBT  1/13: Overnight due to concerns of aspiration TF's held.  Will repeat SBT today 1/14: Overnight pt had possible seizure activity with rhythmic tremors noted in the mouth/lower face/neck during episode pt unresponsive to pain.  Received 2 mg of versed and 2g keppra bolus with resolution of symptoms.  CT Head negative.  Standing dose of keppra 1g bid.  Neurology consulted.    1/15: On minimal vent settings, unable to tolerate WUA due to increased WOB. Shifting measures given for Hyperkalemia  of 5.5 with peaked T waves.  Abdomen distended, KUB without evidence of obstruction, give Lactulose.  Diurese with 40 mg IV Lasix x1. 1/16: Failed WUA, with increased WOB and hypoxic with O2 sats dropping to mid 70's.  Add Seroquel.  Palliative Care consulted to assist with GOC. 1/17: Precedex started for WUA.  Failed SBT due to increased WOB, tachypnea, and hypoxia.  Increase Seroquel to 50 mg BID. Transition Heparin to Elqiuis 1/18: Pt remains mechanically intubated and has failed multiple SBT's.  Planning for family meeting today at bedside with palliative care and PCCM will perform SBT once family arrives at bedside.  Remains sedated with propofol and fentanyl gtts.  Family meeting held code status changed to DNR per family request.  Pts family trying to decide if they want to proceed with tracheostomy.  Required additional sedation and prn vecuronium overnight due to vent dyssynchrony  1/19: Pt febrile overnight with increased vasopressor requirements levophed gtt @12  mcg/min and febrile tmax 101 degrees F concerning for recurrent sepsis.  Broad spectrum abx started. Pt remains mechanically intubated and unable to successfully liberate from ventilator  1/20 remains on vent, severe rap failure 1/21 remains on vent prolonged exp phase air trapping  on vent 1/22 remains on vent 1/23 CT ABD- Colonic ileus pattern shows significant improvement since the prior radiograph with no further significant gaseous distension of the colon. 1/23 CT chest B/L Lower lobe pneumonia severe emphysema 1/23 CT head-possible cerebral edema 1/23 remains on vent failure to wean 1/24 remains intubated, severe hypoxia, shock 1/25 remains intubated, severe hypoxia, shock  Interim History / Subjective:  Severe end stage COPD Needs TRACH AND PEG TUBE TO SURVIVE +fevers PEG and TRACH pending clinical improvement  Remains critically ill Remains intubated Severe hypoxia Requires VENT support for survival   Vent  Mode: PRVC FiO2 (%):  [35 %] 35 % Set Rate:  [12 bmp] 12 bmp Vt Set:  [570 mL] 570 mL PEEP:  [5 cmH20] 5 cmH20 Plateau Pressure:  [16 cmH20] 16 cmH20     Objective   Blood pressure 96/61, pulse 89, temperature 98.9 F (37.2 C), temperature source Axillary, resp. rate 12, height 6' (1.829 m), weight 59 kg, SpO2 100%.    Vent Mode: PRVC FiO2 (%):  [35 %] 35 % Set Rate:  [12 bmp] 12 bmp Vt Set:  [570 mL] 570 mL PEEP:  [5 cmH20] 5 cmH20 Plateau Pressure:  [16 cmH20] 16 cmH20   Intake/Output Summary (Last 24 hours) at 09/28/2023 0743 Last data filed at 09/28/2023 0600 Gross per 24 hour  Intake 2279.75 ml  Output 800 ml  Net 1479.75 ml   Filed Weights   09/26/23 0500 09/27/23 0440 09/28/23 9604  Weight: 60.1 kg 58.4 kg 59 kg       REVIEW OF SYSTEMS  PATIENT IS UNABLE TO PROVIDE COMPLETE REVIEW OF SYSTEMS DUE TO SEVERE CRITICAL ILLNESS   PHYSICAL EXAMINATION:  GENERAL:critically ill appearing, +resp distress EYES: Pupils equal, round, reactive to light.  No scleral icterus.  MOUTH: Moist mucosal membrane. INTUBATED NECK: Supple.  PULMONARY: Lungs clear to auscultation, +rhonchi, +wheezing CARDIOVASCULAR: S1 and S2.  Regular rate and rhythm GASTROINTESTINAL: Soft, nontender, -distended. Positive bowel sounds.  MUSCULOSKELETAL: edema.  NEUROLOGIC: obtunded, SKIN:normal, warm to touch, Capillary refill delayed  Pulses present bilaterally      Assessment & Plan:  63 yo AAM with Acute  metabolic encephalopathy due to severe hypoxic and hypercapnic resp failure due to severe COPD exacerbation with sepsis and RSV pneumonia complicated by demand ischemia, failure to wean from vent, needs artificial support to survive  Severe ACUTE Hypoxic and Hypercapnic Respiratory Failure -continue Mechanical Ventilator support -Wean Fio2 and PEEP as tolerated -VAP/VENT bundle implementation - Wean PEEP & FiO2 as tolerated, maintain SpO2 > 88% - Head of bed elevated 30 degrees, VAP  protocol in place - Plateau pressures less than 30 cm H20  - Intermittent chest x-ray & ABG PRN - Ensure adequate pulmonary hygiene  Failure to wean from vent Needs TRACH to survive   SEVERE COPD EXACERBATION -continue IV steroids as prescribed -continue NEB THERAPY as prescribed CT CHEST REVIEWED SEVERE EMPHYSEMA   CARDIAC -DEMAND ISCHEMIA Mildly elevated troponin secondary to demand ischemia  Hypotension suspect secondary to possible sepsis and sedating medication  HOLD ALL ATIPLT AND AC therapy for prcodeures Hx: HLD and DVT on Eliquis Echo 09/14/23: 50 to 55%, indeterminate diastolic parameters - Cardiology signed off no further cardiac evaluations indicated at this time    INFECTIOUS DISEASE -continue antibiotics as prescribed -follow up cultures CT chest +pneumonia    ENDO - ICU hypoglycemic\Hyperglycemia protocol -check FSBS per protocol   GI GI PROPHYLAXIS as indicated NUTRITIONAL STATUS DIET-->TF's as tolerated Constipation protocol as indicated CT ABD ILEUS IMPROVING  ELECTROLYTES -follow labs as needed -replace as needed -pharmacy consultation and following  RESTRICTIVE TRANSFUSION PROTOCOL TRANSFUSION  IF HGB<7  or ACTIVE BLEEDING OR DX of ACUTE CORONARY SYNDROMES   CT ABD ILEUS IMPROVING     Pt is critically ill with AECOPD in the setting of RSV and pneumonia.  Anticipate difficulty in liberating from mechanical ventilation.  May end up needing Tracheostomy.  Scheduled meeting on Monday 09/22/23 at 1300  Best Practice (right click and "Reselect all SmartList Selections" daily)   Diet/type: TF's DVT prophylaxis Apixaban  Pressure ulcer(s): N/A GI prophylaxis: Pepcid  Lines: RUE PICC Line and still needed  Foley:  External Catheter  Code Status: DNR Last date of multidisciplinary goals of care discussion [09/22/2023]  01/20: proceed with trach and PEG tube Labs   CBC: Recent Labs  Lab 09/25/23 0420 09/25/23 1204 09/26/23 0520  09/27/23 0352 09/28/23 0410  WBC 18.0* 21.4* 21.2* 17.1* 13.3*  NEUTROABS 15.1*  --  19.0*  --   --   HGB 9.1* 9.1* 8.1* 8.2* 8.5*  HCT 30.0* 30.1* 25.9* 26.7* 27.9*  MCV 102.0* 101.3* 98.1 99.6 98.9  PLT 350 339 328 314 319    Basic Metabolic Panel: Recent Labs  Lab 09/22/23 0400 09/23/23 0519 09/24/23 0418 09/25/23 0420 09/26/23 0520 09/27/23 0352 09/28/23 0409  NA 136   < > 139 137 137 140 142  K 4.1   < > 4.9 4.3 4.2 3.7 4.6  CL 95*   < > 93* 95* 94* 94* 99  CO2 34*   < > 37* 33* 32 35* 35*  GLUCOSE 142*   < > 209* 174* 217* 132* 208*  BUN 26*   < > 34* 36* 36* 36* 31*  CREATININE 0.45*   < > 0.45* 0.62 0.56* 0.47* 0.47*  CALCIUM 8.4*   < > 8.9 8.3* 8.9 9.2 9.2  MG 2.1  --  2.2 2.4 2.3  --  2.3  PHOS 2.6  --  2.6 2.8 2.1* 1.7* 2.7   < > = values in this interval not displayed.   GFR: Estimated Creatinine Clearance: 80.9 mL/min (A) (by C-G formula based on SCr of 0.47 mg/dL (L)). Recent Labs  Lab 09/25/23 1204 09/25/23 1821 09/26/23 0520 09/27/23 0352 09/28/23 0410  PROCALCITON  --  3.95  --   --   --   WBC 21.4*  --  21.2* 17.1* 13.3*    Liver Function Tests: Recent Labs  Lab 09/22/23 0400  ALBUMIN 2.2*   No results for input(s): "LIPASE", "AMYLASE" in the last 168 hours. No results for input(s): "AMMONIA" in the last 168 hours.  ABG    Component Value Date/Time   PHART 7.43 09/20/2023 2332   PCO2ART 62 (H) 09/20/2023 2332   PO2ART 53 (L) 09/20/2023 2332   HCO3 41.2 (H) 09/20/2023 2332   O2SAT 86.6 09/20/2023 2332     Coagulation Profile: Recent Labs  Lab 09/22/23 1427 09/25/23 1204 09/26/23 1307  INR 1.1 1.2 1.3*     Cardiac Enzymes: No results for input(s): "CKTOTAL", "CKMB", "CKMBINDEX", "TROPONINI" in the last 168 hours.  HbA1C: Hgb A1c MFr Bld  Date/Time Value Ref Range Status  09/15/2023 09:24 PM 6.4 (H) 4.8 - 5.6 % Final    Comment:    (NOTE) Pre diabetes:          5.7%-6.4%  Diabetes:              >6.4%  Glycemic  control for   <7.0% adults with diabetes     CBG: Recent Labs  Lab 09/27/23 1538 09/27/23 1913 09/27/23 1940 09/27/23 2355 09/28/23 0350  GLUCAP 128* 157* 161* 151* 179*    Review of Systems:   Unable to assess pt mechanically intubated   Past Medical History:  He,  has a past medical history of COPD (chronic obstructive pulmonary disease) (HCC), DVT (deep venous thrombosis) (HCC), Dyspnea, GERD (gastroesophageal reflux disease), HLD (hyperlipidemia), migraines, Oxygen dependent, Pneumonia (2023), Pre-diabetes, Pulmonary nodule, Rotator cuff tear, right, Tobacco abuse, and Tobacco use.   Surgical History:   Past Surgical History:  Procedure Laterality Date   CHEST TUBE INSERTION     SHOULDER ARTHROSCOPY WITH SUBACROMIAL DECOMPRESSION, ROTATOR CUFF REPAIR AND BICEP TENDON REPAIR Right 02/18/2023   Procedure: RIGHT SHOULDER ARTHROSCOPY WITH DEBRIDEMENT, DECOMPRESSION, ROTATOR CUFF REPAIR, BICEPS TENODESIS.;  Surgeon: Christena Flake, MD;  Location: [facility] ORS;  Service: Orthopedics;  Laterality: Right;     Social History:   reports that he has been smoking cigarettes. He has a 12 pack-year smoking history. He has never used smokeless tobacco. He reports current alcohol use. He reports that he does not use drugs.   Family History:  His family history is not on file.   Allergies No Active Allergies   Home Medications  Prior to Admission medications   Medication Sig Start Date End Date Taking? Authorizing Provider  albuterol (PROVENTIL) (2.5 MG/3ML) 0.083% nebulizer solution USE 1 VIAL IN NEBULIZER EVERY  6 HOURS AS NEEDED FOR WHEEZING 06/28/21  Yes [provider]  albuterol (VENTOLIN HFA) 108 (90 Base) MCG/ACT inhaler Inhale 1 puff into the lungs every 4 (four) hours as needed for wheezing or shortness of breath.   Yes [provider]  APIXABAN Everlene Balls) VTE STARTER PACK (10MG  AND 5MG ) Take as directed on package: start with two-5mg  tablets twice daily for 7  days. On day 8, switch to one-5mg  tablet twice daily. 02/26/23  Yes Merwyn Katos, MD  ondansetron (ZOFRAN) 4 MG tablet Take 1 tablet (4 mg total) by mouth every 6 (six) hours as needed for nausea. 02/19/23  Yes Anson Oregon, PA-C  oxyCODONE (OXY IR/ROXICODONE) 5 MG immediate release tablet Take 1 tablet by mouth 2 (two) times daily as needed. 08/29/23  Yes [provider]  OXYGEN Inhale 3 L into the lungs continuous.   Yes [provider]  predniSONE (DELTASONE) 10 MG tablet Take 2-6 tablets by mouth daily with breakfast.  ORIGINAL ZOX:WRUE 6 tabs daily x4 days; then 4 tabs daily x4 days; then 2 tabs daily x4 days.   Yes [provider]  predniSONE (DELTASONE) 20 MG tablet Take 20 mg by mouth daily with breakfast.   Yes [provider]  TRELEGY ELLIPTA 100-62.5-25 MCG/ACT AEPB Inhale 1 puff into the lungs every morning. 07/04/21  Yes [provider]  amoxicillin-clavulanate (AUGMENTIN) 875-125 MG tablet Take 1 tablet by mouth every 12 (twelve) hours. Patient not taking: Reported on 09/10/2023    [provider]  benralizumab (FASENRA PEN) 30 MG/ML prefilled autoinjector Inject 30 mg into the skin as directed. Patient not taking: Reported on 09/10/2023 07/23/23   [provider]  nystatin (MYCOSTATIN) 100000 UNIT/ML suspension Take 5 mLs by mouth 4 (four) times daily. Patient not taking: Reported on 09/10/2023 09/04/23   [provider]  omeprazole (PRILOSEC) 20 MG capsule Take 20 mg by mouth daily. Patient not taking: Reported on 09/10/2023    [provider]  oxyCODONE (OXY IR/ROXICODONE) 5 MG immediate release tablet Take 1-2 tablets (5-10 mg total) by mouth every 4 (four) hours as needed for severe pain. 02/19/23   Anson Oregon, PA-C      DVT/GI PRX  assessed I Assessed the need for Labs I Assessed the need for Foley I Assessed the need for Central Venous Line Family Discussion when available I Assessed the  need for Mobilization I made an Assessment of medications to be adjusted accordingly Safety Risk assessment completed  CASE DISCUSSED IN MULTIDISCIPLINARY ROUNDS WITH ICU TEAM    Patient with multiorgan failure Patient with very poor chance of meaningful recovery  Will plan to Re-Coordinate PEG tube by IR and [facility] by ENT   Critical Care Time devoted to patient care services described in this note is 60 minutes.  Critical care was necessary to treat /prevent imminent and life-threatening deterioration. Overall, patient is critically ill, prognosis is guarded.  Patient with Multiorgan failure and at high risk for cardiac arrest and death.    Lucie Leather, M.D.  [facility]  Medical Director [facility] Medical Director [facility] Cardio-Pulmonary Department

## 2023-09-29 LAB — GLUCOSE, CAPILLARY
Glucose-Capillary: 163 mg/dL — ABNORMAL HIGH (ref 70–99)
Glucose-Capillary: 142 mg/dL — ABNORMAL HIGH (ref 70–99)
Glucose-Capillary: 51 mg/dL — ABNORMAL LOW (ref 70–99)
Glucose-Capillary: 54 mg/dL — ABNORMAL LOW (ref 70–99)
Glucose-Capillary: 133 mg/dL — ABNORMAL HIGH (ref 70–99)
Glucose-Capillary: 198 mg/dL — ABNORMAL HIGH (ref 70–99)
Glucose-Capillary: 180 mg/dL — ABNORMAL HIGH (ref 70–99)

## 2023-09-29 LAB — CBC
HCT: 28.1 % — ABNORMAL LOW (ref 39.0–52.0)
Hemoglobin: 8.6 g/dL — ABNORMAL LOW (ref 13.0–17.0)
MCH: 30.2 pg (ref 26.0–34.0)
MCHC: 30.6 g/dL (ref 30.0–36.0)
MCV: 98.6 fL (ref 80.0–100.0)
Platelets: 309 10*3/uL (ref 150–400)
RBC: 2.85 MIL/uL — ABNORMAL LOW (ref 4.22–5.81)
RDW: 14.9 % (ref 11.5–15.5)
WBC: 13.4 10*3/uL — ABNORMAL HIGH (ref 4.0–10.5)
nRBC: 0.1 % (ref 0.0–0.2)

## 2023-09-29 LAB — RENAL FUNCTION PANEL
Albumin: 2.2 g/dL — ABNORMAL LOW (ref 3.5–5.0)
Anion gap: 10 (ref 5–15)
BUN: 29 mg/dL — ABNORMAL HIGH (ref 8–23)
CO2: 34 mmol/L — ABNORMAL HIGH (ref 22–32)
Calcium: 9.4 mg/dL (ref 8.9–10.3)
Chloride: 100 mmol/L (ref 98–111)
Creatinine, Ser: 0.34 mg/dL — ABNORMAL LOW (ref 0.61–1.24)
GFR, Estimated: >60 mL/min (ref >60)
Glucose, Bld: 206 mg/dL — ABNORMAL HIGH (ref 70–99)
Phosphorus: 2.7 mg/dL (ref 2.5–4.6)
Potassium: 4.7 mmol/L (ref 3.5–5.1)
Sodium: 144 mmol/L (ref 135–145)

## 2023-09-29 LAB — BLOOD GAS, VENOUS
Acid-Base Excess: 13.3 mmol/L — ABNORMAL HIGH (ref 0.0–2.0)
Bicarbonate: 39.3 mmol/L — ABNORMAL HIGH (ref 20.0–28.0)
O2 Saturation: 71.5 %
Patient temperature: 37
pCO2, Ven: 54 mm[Hg] (ref 44–60)
pH, Ven: 7.47 — ABNORMAL HIGH (ref 7.25–7.43)
pO2, Ven: 41 mm[Hg] (ref 32–45)

## 2023-09-29 LAB — PHOSPHORUS: Phosphorus: 2.2 mg/dL — ABNORMAL LOW (ref 2.5–4.6)

## 2023-09-29 LAB — MAGNESIUM: Magnesium: 2.2 mg/dL (ref 1.7–2.4)

## 2023-09-29 LAB — HEPARIN LEVEL (UNFRACTIONATED): Heparin Unfractionated: 0.31 [IU]/mL (ref 0.30–0.70)

## 2023-09-29 MED ORDER — DEXMEDETOMIDINE HCL IN NACL 400 MCG/100ML IV SOLN
0.0000 ug/kg/h | INTRAVENOUS | Status: DC
  Administered 2023-09-29: 1 ug/kg/h via INTRAVENOUS
  Administered 2023-09-29: 0.4 ug/kg/h via INTRAVENOUS
  Administered 2023-09-30: 1.1 ug/kg/h via INTRAVENOUS
  Administered 2023-09-30 (×2): 1 ug/kg/h via INTRAVENOUS
  Administered 2023-10-01: 0.3 ug/kg/h via INTRAVENOUS
  Administered 2023-10-01: 1 ug/kg/h via INTRAVENOUS
  Administered 2023-10-01: 0.9 ug/kg/h via INTRAVENOUS
  Filled 2023-09-29 (×9): qty 100

## 2023-09-29 MED ORDER — ACETAMINOPHEN 650 MG RE SUPP
650.0000 mg | RECTAL | Status: DC | PRN
  Administered 2023-09-29: 650 mg via RECTAL
  Filled 2023-09-29: qty 1

## 2023-09-29 MED ORDER — K PHOS MONO-SOD PHOS DI & MONO 155-852-130 MG PO TABS
500.0000 mg | ORAL_TABLET | Freq: Once | ORAL | Status: DC
  Filled 2023-09-29 (×2): qty 2

## 2023-09-29 MED ORDER — DEXTROSE 5 % IV SOLN
250.0000 mg | INTRAVENOUS | Status: DC
  Administered 2023-09-29 – 2023-10-03 (×4): 250 mg via INTRAVENOUS
  Filled 2023-09-29 (×5): qty 2.5

## 2023-09-29 MED ORDER — PREDNISONE 20 MG PO TABS: 35.0000 mg | ORAL_TABLET | Freq: Every day | ORAL | Status: DC

## 2023-09-29 MED ORDER — PREDNISONE 20 MG PO TABS: 50.0000 mg | ORAL_TABLET | Freq: Every day | ORAL | Status: DC

## 2023-09-29 MED ORDER — INSULIN ASPART 100 UNIT/ML IJ SOLN: 0.0000 [IU] | INTRAMUSCULAR | Status: DC

## 2023-09-29 MED ORDER — PREDNISONE 10 MG PO TABS: 10.0000 mg | ORAL_TABLET | Freq: Every day | ORAL | Status: DC

## 2023-09-29 MED ORDER — PREDNISONE 20 MG PO TABS: 20.0000 mg | ORAL_TABLET | Freq: Every day | ORAL | Status: DC

## 2023-09-29 MED ORDER — NOREPINEPHRINE 4 MG/250ML-% IV SOLN: 0.0000 ug/min | INTRAVENOUS | Status: DC

## 2023-09-29 MED ORDER — PREDNISONE 20 MG PO TABS: 45.0000 mg | ORAL_TABLET | Freq: Every day | ORAL | Status: DC

## 2023-09-29 MED ORDER — PREDNISONE 20 MG PO TABS: 30.0000 mg | ORAL_TABLET | Freq: Every day | ORAL | Status: DC

## 2023-09-29 MED ORDER — K PHOS MONO-SOD PHOS DI & MONO 155-852-130 MG PO TABS
500.0000 mg | ORAL_TABLET | Freq: Once | ORAL | Status: DC
Start: 2023-09-29 — End: 2023-09-29
  Filled 2023-09-29 (×2): qty 2

## 2023-09-29 MED ORDER — AZITHROMYCIN 250 MG PO TABS: 250.0000 mg | ORAL_TABLET | Freq: Every day | ORAL | Status: DC

## 2023-09-29 MED ORDER — DEXAMETHASONE SODIUM PHOSPHATE 10 MG/ML IJ SOLN
10.0000 mg | Freq: Once | INTRAMUSCULAR | Status: AC
  Administered 2023-09-29: 10 mg via INTRAVENOUS
  Filled 2023-09-29: qty 1

## 2023-09-29 MED ORDER — PREDNISONE 10 MG PO TABS: 15.0000 mg | ORAL_TABLET | Freq: Every day | ORAL | Status: DC

## 2023-09-29 MED ORDER — SODIUM PHOSPHATES 45 MMOLE/15ML IV SOLN: 15.0000 mmol | Freq: Once | INTRAVENOUS | Status: DC

## 2023-09-29 MED ORDER — POLYVINYL ALCOHOL 1.4 % OP SOLN
1.0000 [drp] | OPHTHALMIC | Status: DC | PRN
  Administered 2023-09-29: 1 [drp] via OPHTHALMIC
  Filled 2023-09-29: qty 15

## 2023-09-29 MED ORDER — IPRATROPIUM-ALBUTEROL 0.5-2.5 (3) MG/3ML IN SOLN
3.0000 mL | Freq: Four times a day (QID) | RESPIRATORY_TRACT | Status: DC | PRN
  Filled 2023-09-29: qty 3

## 2023-09-29 MED ORDER — DEXAMETHASONE 6 MG PO TABS: 10.0000 mg | ORAL_TABLET | Freq: Once | ORAL | Status: DC

## 2023-09-29 MED ORDER — PREDNISONE 20 MG PO TABS: 40.0000 mg | ORAL_TABLET | Freq: Every day | ORAL | Status: DC

## 2023-09-29 MED ORDER — PREDNISONE 20 MG PO TABS: 25.0000 mg | ORAL_TABLET | Freq: Every day | ORAL | Status: DC

## 2023-09-29 MED ORDER — PREDNISONE 10 MG PO TABS: 5.0000 mg | ORAL_TABLET | Freq: Every day | ORAL | Status: DC

## 2023-09-29 MED ORDER — NOREPINEPHRINE 4 MG/250ML-% IV SOLN
INTRAVENOUS | Status: AC
  Administered 2023-09-29: 2 ug/min via INTRAVENOUS
  Filled 2023-09-29: qty 250

## 2023-09-29 MED ORDER — DEXTROSE 50 % IV SOLN
1.0000 | Freq: Once | INTRAVENOUS | Status: AC
  Administered 2023-09-29: 50 mL via INTRAVENOUS
  Filled 2023-09-29: qty 50

## 2023-09-29 NOTE — Consult Note (Signed)
PHARMACY - ANTICOAGULATION CONSULT NOTE  Pharmacy Consult for IV Heparin Indication:  Hx DVT on apixaban prior to admission  Patient Measurements: Height: 6' (182.9 cm) Weight: 60.1 kg (132 lb 7.9 oz) IBW/kg (Calculated) : 77.6 Heparin Dosing Weight: 60.1 kg  Labs: Recent Labs    09/26/23 0520 09/26/23 1307 09/26/23 1307 09/26/23 2016 09/27/23 0352 09/27/23 1139 09/28/23 0409 09/28/23 0410 09/29/23 0422  HGB 8.1*  --   --   --  8.2*  --   --  8.5* 8.6*  HCT 25.9*  --   --   --  26.7*  --   --  27.9* 28.1*  PLT 328  --   --   --  314  --   --  319 309  APTT  --  40*  --  48* 64*  --   --   --   --   LABPROT  --  16.5*  --   --   --   --   --   --   --   INR  --  1.3*  --   --   --   --   --   --   --   HEPARINUNFRC  --  0.15*   < > 0.27* 0.49 0.48 0.44  --  0.31  CREATININE 0.56*  --   --   --  0.47*  --  0.47*  --   --    < > = values in this interval not displayed.    Estimated Creatinine Clearance: 82.4 mL/min (A) (by C-G formula based on SCr of 0.47 mg/dL (L)).  Medical History: Past Medical History:  Diagnosis Date   COPD (chronic obstructive pulmonary disease) (HCC)    DVT (deep venous thrombosis) (HCC)    Dyspnea    GERD (gastroesophageal reflux disease)    no meds   HLD (hyperlipidemia)    Hx of migraines    Oxygen dependent    3 L [place]   Pneumonia 2023   Pre-diabetes    Pulmonary nodule    Rotator cuff tear, right    Tobacco abuse    Tobacco use     Medications:  Apixaban 5 mg BID, last dose 09/22/23  Assessment: 63 y/o M with medical history as above and including hx DVT on apixaban admitted with respiratory failure in setting of RSV. Has had prolonged admission with inability to wean from ventilator. Plan is for tracheostomy and PEG tube. Pharmacy consulted to initiate and manage heparin infusion while apixaban on hold in setting of need for procedures.  CBC reviewed, notable for anemia and worse today at 8.1 g/dL from 9.1 g/dL  yesterday.  Baseline HL 0.15, INR 1.3, aPTT 40s  Labs Date Time Results Comments  1/24 2016 aPTT=48 HL=0.27 Heparin rate 900 units/hr->  1/25 0352 HL 0.49 Therapeutic x 1  1/26 0409 HL 0.44 Therapeutic x 2  1/27 0422 HL 0.31 Therapeutic X 3     Goal of Therapy:  Heparin level 0.3-0.7 units/ml aPTT 66 - 102 seconds Monitor platelets by anticoagulation protocol: Yes   Plan:  Continue infusion rate at 1100 units/hr. Recheck HL with morning labs Daily CBC per protocol while on IV heparin  Flora Ratz D 09/29/2023 5:05 AM

## 2023-09-29 NOTE — Progress Notes (Addendum)
Pt becoming anxious, tachycardic, hypertensive, and tachypneic on bipap. Annabelle Harman NP called to bedside. Per NP will start precedex drip. Will continue to monitor.

## 2023-09-29 NOTE — Plan of Care (Signed)
  Problem: Clinical Measurements: Goal: Ability to maintain clinical measurements within normal limits will improve Outcome: Progressing Goal: Will remain free from infection Outcome: Progressing Goal: Diagnostic test results will improve Outcome: Progressing Goal: Respiratory complications will improve Outcome: Not Progressing Goal: Cardiovascular complication will be avoided Outcome: Progressing   Problem: Elimination: Goal: Will not experience complications related to bowel motility Outcome: Progressing Goal: Will not experience complications related to urinary retention Outcome: Progressing

## 2023-09-29 NOTE — Progress Notes (Signed)
PHARMACY CONSULT NOTE - FOLLOW UP  Pharmacy Consult for Electrolyte Monitoring and Replacement   Recent Labs: Potassium (mmol/L)  Date Value  09/28/2023 4.6   Magnesium (mg/dL)  Date Value  78/24/2353 2.2   Calcium (mg/dL)  Date Value  61/44/3154 9.2   Albumin (g/dL)  Date Value  00/86/7619 2.2 (L)   Phosphorus (mg/dL)  Date Value  50/93/2671 2.2 (L)   Sodium (mmol/L)  Date Value  09/28/2023 142     Assessment: 63 y/o male with h/o COPD, DVT, HLD, GERD, pulmonary nodule and DM who is admitted with RSV, pneumonia and COPD exacerbation. Pharmacy is asked to follow and replace electrolytes while in CCU  Goal of Therapy:  Electrolytes WNL  Plan:  ---500 mg per tube K-Phos x 1 (contains phosphorus 16 mMol, potassium 2.2 mEq ) ---recheck electrolyte sin am  Lowella Bandy ,PharmD Clinical Pharmacist 09/29/2023 1:39 PM

## 2023-09-29 NOTE — Progress Notes (Signed)
NAME:  Benjamin Bolton, MRN:  [id number], DOB:  [birth year], LOS: 19 ADMISSION DATE:  09/10/2023, CONSULTATION DATE: 09/13/2023  Brief Pt Description / Synopsis:  63 y.o. 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   09/29/23- Extubated to BIPAP, I met with family at bedside and reviewed medical plan as well as answered questions.   Pertinent  Medical History  DVT on Eliquis COPD on 2L O2 Type II Diabetes Mellitus  GERD HLD Pulmonary Nodule Tobacco Abuse   Micro Data:  1/8: SARS-CoV-2 PCR>>negative 1/8: Blood culture x2>> no growth 1/8: Urine>> multiple species (suggest recollection) 1/9: RVP>> + RSV 1/9: Sputum>> normal respiratory flora 1/9: HIV Screen>> non reactive 1/11: MRSA PCR>> negative 1/19: MRSA PCR>> 1/19: RVP>>  Antimicrobials:   Anti-infectives (From admission, onward)    Start     Dose/Rate Route Frequency Ordered Stop   09/25/23 1200  linezolid (ZYVOX) IVPB 600 mg  Status:  Discontinued        600 mg 300 mL/hr over 60 Minutes Intravenous Every 12 hours 09/25/23 0918 09/26/23 1206   09/25/23 1015  ceFEPIme (MAXIPIME) 2 g in sodium chloride 0.9 % 100 mL IVPB        2 g 200 mL/hr over 30 Minutes Intravenous Every 8 hours 09/25/23 0918     09/25/23 0000  ceFAZolin (ANCEF) IVPB 2g/100 mL premix        2 g 200 mL/hr over 30 Minutes Intravenous  Once 09/23/23 1103 09/25/23 0013   09/22/23 0010  vancomycin (VANCOCIN) IVPB 750 mg/150 ml premix  Status:  Discontinued        750 mg 150 mL/hr over 60 Minutes Intravenous Every 12 hours 09/22/23 0004 09/22/23 1039   09/22/23 0000  Vancomycin (VANCOCIN) 750 mg IVPB  Status:  Discontinued        750 mg 150 mL/hr over 60 Minutes Intravenous Every 12 hours 09/21/23 1120 09/21/23 2356   09/22/23 0000   vancomycin (VANCOREADY) IVPB 750 mg/150 mL  Status:  Discontinued        750 mg 150 mL/hr over 60 Minutes Intravenous Every 12 hours 09/21/23 2356 09/22/23 0004   09/21/23 1300  vancomycin (VANCOCIN) IVPB 1000 mg/200 mL premix        1,000 mg 200 mL/hr over 60 Minutes Intravenous  Once 09/21/23 1120 09/21/23 1900   09/21/23 1215  piperacillin-tazobactam (ZOSYN) IVPB 3.375 g  Status:  Discontinued        3.375 g 12.5 mL/hr over 240 Minutes Intravenous Every 8 hours 09/21/23 1120 09/23/23 1257   09/21/23 1130  vancomycin (VANCOREADY) IVPB 1250 mg/250 mL  Status:  Discontinued        1,250 mg 166.7 mL/hr over 90 Minutes Intravenous  Once 09/21/23 1046 09/21/23 1120   09/21/23 1100  piperacillin-tazobactam (ZOSYN) IVPB 3.375 g  Status:  Discontinued        3.375 g 12.5 mL/hr over 240 Minutes Intravenous  Once 09/21/23 1046 09/21/23 1152   09/13/23 1445  piperacillin-tazobactam (ZOSYN) IVPB 3.375 g        3.375 g 12.5 mL/hr over 240 Minutes Intravenous Every 8 hours 09/13/23 1351 09/17/23 0157   09/10/23 2315  cefTRIAXone (ROCEPHIN) 1 g in sodium chloride 0.9 % 100 mL IVPB  Status:  Discontinued        1 g 200  mL/hr over 30 Minutes Intravenous Every 24 hours 09/10/23 2305 09/13/23 1336          REVIEW OF SYSTEMS   PATIENT IS UNABLE TO PROVIDE COMPLETE REVIEW OF SYSTEMS DUE TO SEVERE CRITICAL ILLNESS    PHYSICAL EXAMINATION:   GENERAL:critically ill appearing, +resp distress EYES: Pupils equal, round, reactive to light.  No scleral icterus.  MOUTH: Moist mucosal membrane. INTUBATED NECK: Supple.  PULMONARY: Lungs clear to auscultation, +rhonchi, +wheezing CARDIOVASCULAR: S1 and S2.  Regular rate and rhythm GASTROINTESTINAL: Soft, nontender, -distended. Positive bowel sounds.  MUSCULOSKELETAL: edema.  NEUROLOGIC: obtunded, SKIN:normal, warm to touch, Capillary refill delayed  Pulses present bilaterally      Significant Hospital Events: Including procedures, antibiotic start  and stop dates in addition to other pertinent events   1/08: admit to [facility] 1/11: rapid response, transfer to ICU, intubated 1/12: remains vented, sedated, does not follow commands.  Failed SBT  1/13: Overnight due to concerns of aspiration TF's held.  Will repeat SBT today 1/14: Overnight pt had possible seizure activity with rhythmic tremors noted in the mouth/lower face/neck during episode pt unresponsive to pain.  Received 2 mg of versed and 2g keppra bolus with resolution of symptoms.  CT Head negative.  Standing dose of keppra 1g bid.  Neurology consulted.    1/15: On minimal vent settings, unable to tolerate WUA due to increased WOB. Shifting measures given for Hyperkalemia of 5.5 with peaked T waves.  Abdomen distended, KUB without evidence of obstruction, give Lactulose.  Diurese with 40 mg IV Lasix x1. 1/16: Failed WUA, with increased WOB and hypoxic with O2 sats dropping to mid 70's.  Add Seroquel.  Palliative Care consulted to assist with GOC. 1/17: Precedex started for WUA.  Failed SBT due to increased WOB, tachypnea, and hypoxia.  Increase Seroquel to 50 mg BID. Transition Heparin to Elqiuis 1/18: Pt remains mechanically intubated and has failed multiple SBT's.  Planning for family meeting today at bedside with palliative care and PCCM will perform SBT once family arrives at bedside.  Remains sedated with propofol and fentanyl gtts.  Family meeting held code status changed to DNR per family request.  Pts family trying to decide if they want to proceed with tracheostomy.  Required additional sedation and prn vecuronium overnight due to vent dyssynchrony  1/19: Pt febrile overnight with increased vasopressor requirements levophed gtt @12  mcg/min and febrile tmax 101 degrees F concerning for recurrent sepsis.  Broad spectrum abx started. Pt remains mechanically intubated and unable to successfully liberate from ventilator  1/20 remains on vent, severe rap failure 1/21 remains on vent prolonged  exp phase air trapping  on vent 1/22 remains on vent 1/23 CT ABD- Colonic ileus pattern shows significant improvement since the prior radiograph with no further significant gaseous distension of the colon. 1/23 CT chest B/L Lower lobe pneumonia severe emphysema 1/23 CT head-possible cerebral edema 1/23 remains on vent failure to wean 1/24 remains intubated, severe hypoxia, shock 1/25 remains intubated, severe hypoxia, shock  Interim History / Subjective:   1.Acute on chronic hypoxemic respiratory failure          RSV pneumonia      - Extubated 09/29/23  2. Advanced COPD       Continue zithromax       S/p decadeon x 1 pre liberation , will start prednisone taper  3. PAF - dcd heparin gtt       Currentnly in SR  - on heprin sq   ENDO -  ICU hypoglycemic\Hyperglycemia protocol -check FSBS per protocol     GI GI PROPHYLAXIS as indicated NUTRITIONAL STATUS DIET-->TF's as tolerated Constipation protocol as indicated CT ABD ILEUS IMPROVING   ELECTROLYTES -follow labs as needed -replace as needed -pharmacy consultation and following   RESTRICTIVE TRANSFUSION PROTOCOL TRANSFUSION  IF HGB<7  or ACTIVE BLEEDING OR DX of ACUTE CORONARY SYNDROMES     CT ABD ILEUS IMPROVING      Best Practice (right click and "Reselect all SmartList Selections" daily)    Diet/type: TF's DVT prophylaxis Apixaban  Pressure ulcer(s): N/A GI prophylaxis: Pepcid  Lines: RUE PICC Line and still needed  Foley:  External Catheter  Code Status: DNR  Objective   Blood pressure 100/69, pulse 95, temperature 98.9 F (37.2 C), resp. rate 13, height 6' (1.829 m), weight 60.1 kg, SpO2 100%.    Vent Mode: PRVC FiO2 (%):  [35 %] 35 % Set Rate:  [12 bmp-16 bmp] 12 bmp Vt Set:  [570 mL] 570 mL PEEP:  [5 cmH20] 5 cmH20 Plateau Pressure:  [13 cmH20-24 cmH20] 24 cmH20   Intake/Output Summary (Last 24 hours) at 09/29/2023 1050 Last data filed at 09/29/2023 0600 Gross per 24 hour  Intake 2028.4 ml   Output 650 ml  Net 1378.4 ml   Filed Weights   09/27/23 0440 09/28/23 0438 09/29/23 0440  Weight: 58.4 kg 59 kg 60.1 kg       REVIEW OF SYSTEMS  PATIENT IS UNABLE TO PROVIDE COMPLETE REVIEW OF SYSTEMS DUE TO CRITICAL ILLNESS   PHYSICAL EXAMINATION:  GENERAL:critically ill appearing, +resp distress EYES: Pupils equal, round, reactive to light.  No scleral icterus.  MOUTH: Moist mucosal membrane. INTUBATED NECK: Supple.  PULMONARY: Lungs clear to auscultation, +rhonchi, +wheezing CARDIOVASCULAR: S1 and S2.  Regular rate and rhythm GASTROINTESTINAL: Soft, nontender, -distended. Positive bowel sounds.  MUSCULOSKELETAL: edema.  NEUROLOGIC: obtunded, SKIN:normal, warm to touch, Capillary refill delayed  Pulses present bilaterally    Critical care provider statement:   Total critical care time: 35 minutes   Performed by: Karna Christmas MD   Critical care time was exclusive of separately billable procedures and treating other patients.   Critical care was necessary to treat or prevent imminent or life-threatening deterioration.   Critical care was time spent personally by me on the following activities: development of treatment plan with patient and/or surrogate as well as nursing, discussions with consultants, evaluation of patient's response to treatment, examination of patient, obtaining history from patient or surrogate, ordering and performing treatments and interventions, ordering and review of laboratory studies, ordering and review of radiographic studies, pulse oximetry and re-evaluation of patient's condition.    Vida Rigger, M.D.  Pulmonary & Critical Care Medicine

## 2023-09-29 NOTE — Inpatient Diabetes Management (Signed)
Inpatient Diabetes Program Recommendations  AACE/ADA: New Consensus Statement on Inpatient Glycemic Control  Target Ranges:  Prepandial:   less than 140 mg/dL      Peak postprandial:   less than 180 mg/dL (1-2 hours)      Critically ill patients:  140 - 180 mg/dL    Latest Reference Range & Units 09/29/23 03:57 09/29/23 07:24 09/29/23 11:38 09/29/23 11:40  Glucose-Capillary 70 - 99 mg/dL 161 (H) 096 (H) 51 (L) 54 (L)    Latest Reference Range & Units 09/28/23 03:50 09/28/23 07:56 09/28/23 11:56 09/28/23 15:58 09/28/23 19:50 09/28/23 23:33  Glucose-Capillary 70 - 99 mg/dL 045 (H) 409 (H) 811 (H) 212 (H) 224 (H) 227 (H)   Review of Glycemic Control  Diabetes history: PreDM Outpatient Diabetes medications: None Current orders for Inpatient glycemic control: Novolog 0-20 units Q4H, Novolog 5 units Q4H; Vital @ 65 ml/hr  Inpatient Diabetes Program Recommendations:    Insulin: CBG down to 51 mg/dl at 91:47 am today. Noted patient received Decadron 10 mg at 12:06 today. No other steroids ordered at this time. May want to consider decreasing Novolog correction to 0-15 units Q4H.   Thanks, Orlando Penner, RN, MSN, CDCES Diabetes Coordinator Inpatient Diabetes Program [phone] (Team Pager from 8am to 5pm)

## 2023-09-29 NOTE — Progress Notes (Signed)
Verbal orders given to extubate pt, RT at bedside.

## 2023-09-29 NOTE — Progress Notes (Signed)
Nutrition Follow Up Note   DOCUMENTATION CODES:   Non-severe (moderate) malnutrition in context of chronic illness  INTERVENTION:   RD will add supplements with diet advancement   MVI po daily with diet advancement   Recommend thiamine 100mg  IV x 5 days  Pt remains at high refeed risk; recommend monitor potassium, magnesium and phosphorus labs daily until stable  Daily weights   NUTRITION DIAGNOSIS:   Moderate Malnutrition related to chronic illness (pt sedated and ventilated) as evidenced by moderate fat depletion, moderate muscle depletion. -ongoing   GOAL:   Patient will meet greater than or equal to 90% of their needs -previously met with tube feeds  MONITOR:   Diet advancement, Labs, Weight trends, I & O's, Skin  ASSESSMENT:   63 y/o male with h/o COPD, DVT, HLD, GERD, pulmonary nodule and DM who is admitted with RSV, pneumonia and COPD exacerbation.  Pt extubated to bipap today. RD will add supplements with diet advancement. Recommend NGT placement and nutrition support if patient is unable to be initiated on a diet. Pt is actively refeeding; electrolytes being monitored and supplemented. Will add thiamine. Per chart, pt appears to be at his UBW.   Medications reviewed and include: azithromycin, colace, pepcid, insulin, MVI, Kphos, miralax  Labs reviewed: K 4.6 wnl, BUN 31(H), creat 0.47(L), P 2.2(L), Mg 2.2 wnl Wbc- 13.4(H), Hgb 8.6(L), Hct 28.1(L) Cbgs- 133, 54, 51, 142, 163 x 24 hrs   UOP-   Nutrition Focused Physical Exam:  Flowsheet Row Most Recent Value  Orbital Region No depletion  Upper Arm Region Moderate depletion  Thoracic and Lumbar Region No depletion  Buccal Region Mild depletion  Temple Region Moderate depletion  Clavicle Bone Region Mild depletion  Clavicle and Acromion Bone Region Mild depletion  Scapular Bone Region Mild depletion  Dorsal Hand No depletion  Patellar Region Severe depletion  Anterior Thigh Region Severe depletion   Posterior Calf Region Severe depletion  Edema (RD Assessment) Mild  Hair Reviewed  Eyes Reviewed  Mouth Reviewed  Skin Reviewed  Nails Reviewed   Diet Order:   Diet Order             Diet NPO time specified  Diet effective midnight                  EDUCATION NEEDS:   No education needs have been identified at this time  Skin:  Skin Assessment: Reviewed RN Assessment (MASD)  Last BM:  1/27- TYPE 7  Height:   Ht Readings from Last 1 Encounters:  09/10/23 6' (1.829 m)    Weight:   Wt Readings from Last 1 Encounters:  09/29/23 60.1 kg    Ideal Body Weight:  80.9 kg  BMI:  Body mass index is 17.97 kg/m.  Estimated Nutritional Needs:   Kcal:  1900-2200kcal/day  Protein:  95-110g/day  Fluid:  1.8-2.1L/day  Betsey Holiday MS, RD, LDN If unable to be reached, please send secure chat to "RD inpatient" available from 8:00a-4:00p daily

## 2023-09-30 LAB — CBC
HCT: 27.6 % — ABNORMAL LOW (ref 39.0–52.0)
Hemoglobin: 8.8 g/dL — ABNORMAL LOW (ref 13.0–17.0)
MCH: 30 pg (ref 26.0–34.0)
MCHC: 31.9 g/dL (ref 30.0–36.0)
MCV: 94.2 fL (ref 80.0–100.0)
Platelets: 326 10*3/uL (ref 150–400)
RBC: 2.93 MIL/uL — ABNORMAL LOW (ref 4.22–5.81)
RDW: 14.8 % (ref 11.5–15.5)
WBC: 14.3 10*3/uL — ABNORMAL HIGH (ref 4.0–10.5)
nRBC: 0.1 % (ref 0.0–0.2)

## 2023-09-30 LAB — GLUCOSE, CAPILLARY
Glucose-Capillary: 123 mg/dL — ABNORMAL HIGH (ref 70–99)
Glucose-Capillary: 137 mg/dL — ABNORMAL HIGH (ref 70–99)
Glucose-Capillary: 146 mg/dL — ABNORMAL HIGH (ref 70–99)
Glucose-Capillary: 148 mg/dL — ABNORMAL HIGH (ref 70–99)
Glucose-Capillary: 150 mg/dL — ABNORMAL HIGH (ref 70–99)
Glucose-Capillary: 155 mg/dL — ABNORMAL HIGH (ref 70–99)

## 2023-09-30 LAB — HEPARIN LEVEL (UNFRACTIONATED): Heparin Unfractionated: <0.1 [IU]/mL — ABNORMAL LOW (ref 0.30–0.70)

## 2023-09-30 LAB — MAGNESIUM: Magnesium: 2.1 mg/dL (ref 1.7–2.4)

## 2023-09-30 LAB — BASIC METABOLIC PANEL
Anion gap: 9 (ref 5–15)
BUN: 29 mg/dL — ABNORMAL HIGH (ref 8–23)
CO2: 32 mmol/L (ref 22–32)
Calcium: 8.7 mg/dL — ABNORMAL LOW (ref 8.9–10.3)
Chloride: 102 mmol/L (ref 98–111)
Creatinine, Ser: 0.48 mg/dL — ABNORMAL LOW (ref 0.61–1.24)
GFR, Estimated: >60 mL/min (ref >60)
Glucose, Bld: 154 mg/dL — ABNORMAL HIGH (ref 70–99)
Potassium: 4.1 mmol/L (ref 3.5–5.1)
Sodium: 143 mmol/L (ref 135–145)

## 2023-09-30 LAB — BLOOD GAS, VENOUS
Acid-Base Excess: 11.5 mmol/L — ABNORMAL HIGH (ref 0.0–2.0)
Bicarbonate: 37.1 mmol/L — ABNORMAL HIGH (ref 20.0–28.0)
O2 Saturation: 72.6 %
Patient temperature: 37
pCO2, Ven: 51 mm[Hg] (ref 44–60)
pH, Ven: 7.47 — ABNORMAL HIGH (ref 7.25–7.43)
pO2, Ven: 45 mm[Hg] (ref 32–45)

## 2023-09-30 LAB — C-REACTIVE PROTEIN: CRP: 13.3 mg/dL — ABNORMAL HIGH (ref <1.0)

## 2023-09-30 MED ORDER — METHYLPREDNISOLONE SODIUM SUCC 40 MG IJ SOLR
20.0000 mg | Freq: Every day | INTRAMUSCULAR | Status: DC
  Administered 2023-09-30 – 2023-10-02 (×3): 20 mg via INTRAVENOUS
  Filled 2023-09-30 (×3): qty 1

## 2023-09-30 MED ORDER — HEPARIN SODIUM (PORCINE) 5000 UNIT/ML IJ SOLN
5000.0000 [IU] | Freq: Three times a day (TID) | INTRAMUSCULAR | Status: DC
  Administered 2023-09-30 – 2023-10-02 (×6): 5000 [IU] via SUBCUTANEOUS
  Filled 2023-09-30 (×6): qty 1

## 2023-09-30 NOTE — Plan of Care (Signed)
  Problem: Education: Goal: Knowledge of General Education information will improve Description: Including pain rating scale, medication(s)/side effects and non-pharmacologic comfort measures Outcome: Progressing   Problem: Health Behavior/Discharge Planning: Goal: Ability to manage health-related needs will improve Outcome: Progressing   Problem: Clinical Measurements: Goal: Ability to maintain clinical measurements within normal limits will improve Outcome: Progressing Goal: Will remain free from infection Outcome: Progressing Goal: Diagnostic test results will improve Outcome: Progressing Goal: Respiratory complications will improve Outcome: Progressing Goal: Cardiovascular complication will be avoided Outcome: Progressing   Problem: Activity: Goal: Risk for activity intolerance will decrease Outcome: Progressing   Problem: Nutrition: Goal: Adequate nutrition will be maintained Outcome: Progressing   Problem: Coping: Goal: Level of anxiety will decrease Outcome: Progressing   Problem: Elimination: Goal: Will not experience complications related to bowel motility Outcome: Progressing Goal: Will not experience complications related to urinary retention Outcome: Progressing   Problem: Pain Management: Goal: General experience of comfort will improve Outcome: Progressing   Problem: Safety: Goal: Ability to remain free from injury will improve Outcome: Progressing   Problem: Skin Integrity: Goal: Risk for impaired skin integrity will decrease Outcome: Progressing   Problem: Education: Goal: Knowledge of disease or condition will improve Outcome: Progressing Goal: Knowledge of the prescribed therapeutic regimen will improve Outcome: Progressing   Problem: Activity: Goal: Ability to tolerate increased activity will improve Outcome: Progressing Goal: Will verbalize the importance of balancing activity with adequate rest periods Outcome: Progressing   Problem:  Respiratory: Goal: Ability to maintain a clear airway will improve Outcome: Progressing Goal: Levels of oxygenation will improve Outcome: Progressing Goal: Ability to maintain adequate ventilation will improve Outcome: Progressing   Problem: Education: Goal: Ability to describe self-care measures that may prevent or decrease complications (Diabetes Survival Skills Education) will improve Outcome: Progressing   Problem: Coping: Goal: Ability to adjust to condition or change in health will improve Outcome: Progressing   Problem: Fluid Volume: Goal: Ability to maintain a balanced intake and output will improve Outcome: Progressing   Problem: Health Behavior/Discharge Planning: Goal: Ability to identify and utilize available resources and services will improve Outcome: Progressing Goal: Ability to manage health-related needs will improve Outcome: Progressing   Problem: Metabolic: Goal: Ability to maintain appropriate glucose levels will improve Outcome: Progressing   Problem: Nutritional: Goal: Maintenance of adequate nutrition will improve Outcome: Progressing Goal: Progress toward achieving an optimal weight will improve Outcome: Progressing   Problem: Skin Integrity: Goal: Risk for impaired skin integrity will decrease Outcome: Progressing   Problem: Tissue Perfusion: Goal: Adequacy of tissue perfusion will improve Outcome: Progressing   Problem: Activity: Goal: Ability to tolerate increased activity will improve Outcome: Progressing   Problem: Respiratory: Goal: Ability to maintain a clear airway and adequate ventilation will improve Outcome: Progressing   Problem: Role Relationship: Goal: Method of communication will improve Outcome: Progressing

## 2023-09-30 NOTE — TOC Progression Note (Signed)
Transition of Care [facility]) - Progression Note    Patient Details  Name: Benjamin Bolton MRN: [id number] Date of Birth: 18-Feb-1961  [facility] CM/SW Contact  Margarito Liner, LCSW Phone Number: 09/30/2023, 1:32 PM  Clinical Narrative: TOC continues to follow progress.    Expected Discharge Plan and Services                                               Social Determinants of Health (SDOH) Interventions SDOH Screenings   Food Insecurity: No Food Insecurity (09/12/2023)  Recent Concern: Food Insecurity - Food Insecurity Present (08/25/2023)   Received from [facility]  Housing: Unknown (09/12/2023)  Transportation Needs: No Transportation Needs (09/12/2023)  Recent Concern: Transportation Needs - Unmet Transportation Needs (08/25/2023)   Received from [facility]  Utilities: Not At Risk (09/12/2023)  Recent Concern: Utilities - At Risk (07/23/2023)   Received from [facility]  Financial Resource Strain: Low Risk  (08/25/2023)   Received [facility]  Recent Concern: Financial Resource Strain - Medium Risk (07/23/2023)   Received from [facility]  Physical Activity: Insufficiently Active (08/25/2023)   Received from [facility]  Social Connections: Socially Isolated (08/25/2023)   Received from [facility]  Stress: No Stress Concern Present (08/25/2023)   Received from [facility]  Tobacco Use: High Risk (09/25/2023)  Health Literacy: Inadequate Health Literacy (08/25/2023)   Received from [facility]    Readmission Risk Interventions     No data to display

## 2023-09-30 NOTE — Progress Notes (Signed)
PHARMACY CONSULT NOTE - FOLLOW UP  Pharmacy Consult for Electrolyte Monitoring and Replacement   Recent Labs: Potassium (mmol/L)  Date Value  09/30/2023 4.1   Magnesium (mg/dL)  Date Value  27/11/5007 2.1   Calcium (mg/dL)  Date Value  38/18/2993 8.7 (L)   Albumin (g/dL)  Date Value  71/69/6789 2.2 (L)   Phosphorus (mg/dL)  Date Value  38/06/1750 2.7   Sodium (mmol/L)  Date Value  09/30/2023 143     Assessment: 63 y/o male with h/o COPD, DVT, HLD, GERD, pulmonary nodule and DM who is admitted with RSV, pneumonia and COPD exacerbation. Pharmacy is asked to follow and replace electrolytes while in CCU  Goal of Therapy:  Electrolytes WNL  Plan:  ---no electrolyte replacement warranted today ---recheck electrolyte sin am  Lowella Bandy ,PharmD Clinical Pharmacist 09/30/2023 7:14 AM

## 2023-09-30 NOTE — Progress Notes (Signed)
   09/30/23 1125  Spiritual Encounters  Type of Visit Initial  Care provided to: Pt and family  Referral source Chaplain assessment  Reason for visit Routine spiritual support  OnCall Visit No  Spiritual Framework  Presenting Themes Courage hope and growth;Other (comment) (Pt and family grateful Pt is off vent)  Interventions  Spiritual Care Interventions Made Established relationship of care and support;Compassionate presence  Intervention Outcomes  Outcomes Connection to spiritual care;Awareness of support

## 2023-09-30 NOTE — Progress Notes (Signed)
NAME:  Benjamin Bolton, MRN:  [id number], DOB:  [birth year], LOS: 20 ADMISSION DATE:  09/10/2023, CONSULTATION DATE: 09/13/2023  Brief Pt Description / Synopsis:  62 y.o. 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   09/29/23- Extubated to BIPAP, I met with family at bedside and reviewed medical plan as well as answered questions.  09/30/23- patient is more awake and able to speak few words asking for water.   Pertinent  Medical History  DVT on Eliquis COPD on 2L O2 Type II Diabetes Mellitus  GERD HLD Pulmonary Nodule Tobacco Abuse   Micro Data:  1/8: SARS-CoV-2 PCR>>negative 1/8: Blood culture x2>> no growth 1/8: Urine>> multiple species (suggest recollection) 1/9: RVP>> + RSV 1/9: Sputum>> normal respiratory flora 1/9: HIV Screen>> non reactive 1/11: MRSA PCR>> negative 1/19: MRSA PCR>> 1/19: RVP>>  Antimicrobials:   Anti-infectives (From admission, onward)    Start     Dose/Rate Route Frequency Ordered Stop   09/29/23 2000  azithromycin (ZITHROMAX) 250 mg in dextrose 5 % 125 mL IVPB        250 mg 127.5 mL/hr over 60 Minutes Intravenous Every 24 hours 09/29/23 1801     09/29/23 1245  azithromycin (ZITHROMAX) tablet 250 mg  Status:  Discontinued        250 mg Per Tube Daily 09/29/23 1150 09/29/23 1801   09/25/23 1200  linezolid (ZYVOX) IVPB 600 mg  Status:  Discontinued        600 mg 300 mL/hr over 60 Minutes Intravenous Every 12 hours 09/25/23 0918 09/26/23 1206   09/25/23 1015  ceFEPIme (MAXIPIME) 2 g in sodium chloride 0.9 % 100 mL IVPB  Status:  Discontinued        2 g 200 mL/hr over 30 Minutes Intravenous Every 8 hours 09/25/23 0918 09/29/23 1053   09/25/23 0000  ceFAZolin (ANCEF) IVPB 2g/100 mL premix        2 g 200 mL/hr over 30 Minutes Intravenous   Once 09/23/23 1103 09/25/23 0013   09/22/23 0010  vancomycin (VANCOCIN) IVPB 750 mg/150 ml premix  Status:  Discontinued        750 mg 150 mL/hr over 60 Minutes Intravenous Every 12 hours 09/22/23 0004 09/22/23 1039   09/22/23 0000  Vancomycin (VANCOCIN) 750 mg IVPB  Status:  Discontinued        750 mg 150 mL/hr over 60 Minutes Intravenous Every 12 hours 09/21/23 1120 09/21/23 2356   09/22/23 0000  vancomycin (VANCOREADY) IVPB 750 mg/150 mL  Status:  Discontinued        750 mg 150 mL/hr over 60 Minutes Intravenous Every 12 hours 09/21/23 2356 09/22/23 0004   09/21/23 1300  vancomycin (VANCOCIN) IVPB 1000 mg/200 mL premix        1,000 mg 200 mL/hr over 60 Minutes Intravenous  Once 09/21/23 1120 09/21/23 1900   09/21/23 1215  piperacillin-tazobactam (ZOSYN) IVPB 3.375 g  Status:  Discontinued        3.375 g 12.5 mL/hr over 240 Minutes Intravenous Every 8 hours 09/21/23 1120 09/23/23 1257   09/21/23 1130  vancomycin (VANCOREADY) IVPB 1250 mg/250 mL  Status:  Discontinued        1,250 mg 166.7 mL/hr over 90 Minutes Intravenous  Once 09/21/23 1046 09/21/23 1120   09/21/23 1100  piperacillin-tazobactam (ZOSYN) IVPB 3.375 g  Status:  Discontinued        3.375 g 12.5 mL/hr over 240 Minutes Intravenous  Once 09/21/23 1046 09/21/23 1152   09/13/23 1445  piperacillin-tazobactam (ZOSYN) IVPB 3.375 g        3.375 g 12.5 mL/hr over 240 Minutes Intravenous Every 8 hours 09/13/23 1351 09/17/23 0157   09/10/23 2315  cefTRIAXone (ROCEPHIN) 1 g in sodium chloride 0.9 % 100 mL IVPB  Status:  Discontinued        1 g 200 mL/hr over 30 Minutes Intravenous Every 24 hours 09/10/23 2305 09/13/23 1336          REVIEW OF SYSTEMS   PATIENT IS UNABLE TO PROVIDE COMPLETE REVIEW OF SYSTEMS DUE TO SEVERE CRITICAL ILLNESS    PHYSICAL EXAMINATION:   GENERAL:critically ill appearing, +resp distress EYES: Pupils equal, round, reactive to light.  No scleral icterus.  MOUTH: Moist mucosal membrane.  INTUBATED NECK: Supple.  PULMONARY: Lungs clear to auscultation, +rhonchi, +wheezing CARDIOVASCULAR: S1 and S2.  Regular rate and rhythm GASTROINTESTINAL: Soft, nontender, -distended. Positive bowel sounds.  MUSCULOSKELETAL: edema.  NEUROLOGIC: obtunded, SKIN:normal, warm to touch, Capillary refill delayed  Pulses present bilaterally      Significant Hospital Events: Including procedures, antibiotic start and stop dates in addition to other pertinent events   1/08: admit to [facility] 1/11: rapid response, transfer to ICU, intubated 1/12: remains vented, sedated, does not follow commands.  Failed SBT  1/13: Overnight due to concerns of aspiration TF's held.  Will repeat SBT today 1/14: Overnight pt had possible seizure activity with rhythmic tremors noted in the mouth/lower face/neck during episode pt unresponsive to pain.  Received 2 mg of versed and 2g keppra bolus with resolution of symptoms.  CT Head negative.  Standing dose of keppra 1g bid.  Neurology consulted.    1/15: On minimal vent settings, unable to tolerate WUA due to increased WOB. Shifting measures given for Hyperkalemia of 5.5 with peaked T waves.  Abdomen distended, KUB without evidence of obstruction, give Lactulose.  Diurese with 40 mg IV Lasix x1. 1/16: Failed WUA, with increased WOB and hypoxic with O2 sats dropping to mid 70's.  Add Seroquel.  Palliative Care consulted to assist with GOC. 1/17: Precedex started for WUA.  Failed SBT due to increased WOB, tachypnea, and hypoxia.  Increase Seroquel to 50 mg BID. Transition Heparin to Elqiuis 1/18: Pt remains mechanically intubated and has failed multiple SBT's.  Planning for family meeting today at bedside with palliative care and PCCM will perform SBT once family arrives at bedside.  Remains sedated with propofol and fentanyl gtts.  Family meeting held code status changed to DNR per family request.  Pts family trying to decide if they want to proceed with tracheostomy.  Required  additional sedation and prn vecuronium overnight due to vent dyssynchrony  1/19: Pt febrile overnight with increased vasopressor requirements levophed gtt @12  mcg/min and febrile tmax 101 degrees F concerning for recurrent sepsis.  Broad spectrum abx started. Pt remains mechanically intubated and unable to successfully liberate from ventilator  1/20 remains on vent, severe rap failure 1/21 remains on vent prolonged exp phase air trapping  on vent 1/22 remains on vent 1/23 CT ABD- Colonic ileus pattern shows significant improvement since the prior radiograph with no further significant gaseous distension of the colon. 1/23 CT chest B/L Lower lobe pneumonia severe emphysema 1/23 CT head-possible cerebral edema 1/23 remains on vent failure to wean 1/24 remains intubated, severe hypoxia, shock 1/25 remains intubated, severe hypoxia,  shock  Interim History / Subjective:   1.Acute on chronic hypoxemic respiratory failure          RSV pneumonia      - Extubated 09/29/23  2. Advanced COPD       Continue zithromax       S/p decadeon x 1 pre liberation , will start prednisone taper  3. PAF - dcd heparin gtt       Currentnly in SR  - on heprin sq   ENDO - ICU hypoglycemic\Hyperglycemia protocol -check FSBS per protocol     GI GI PROPHYLAXIS as indicated NUTRITIONAL STATUS DIET-->TF's as tolerated Constipation protocol as indicated CT ABD ILEUS IMPROVING   ELECTROLYTES -follow labs as needed -replace as needed -pharmacy consultation and following   RESTRICTIVE TRANSFUSION PROTOCOL TRANSFUSION  IF HGB<7  or ACTIVE BLEEDING OR DX of ACUTE CORONARY SYNDROMES     CT ABD ILEUS IMPROVING      Best Practice (right click and "Reselect all SmartList Selections" daily)    Diet/type: TF's DVT prophylaxis Apixaban  Pressure ulcer(s): N/A GI prophylaxis: Pepcid  Lines: RUE PICC Line and still needed  Foley:  External Catheter  Code Status: DNR  Objective   Blood pressure 126/81,  pulse 73, temperature 98.2 F (36.8 C), temperature source Oral, resp. rate 15, height 6' (1.829 m), weight 62 kg, SpO2 100%.    FiO2 (%):  [35 %-53 %] 40 %   Intake/Output Summary (Last 24 hours) at 09/30/2023 1648 Last data filed at 09/30/2023 1600 Gross per 24 hour  Intake 779.03 ml  Output 575 ml  Net 204.03 ml   Filed Weights   09/28/23 0438 09/29/23 0440 09/30/23 0500  Weight: 59 kg 60.1 kg 62 kg       REVIEW OF SYSTEMS  PATIENT IS UNABLE TO PROVIDE COMPLETE REVIEW OF SYSTEMS DUE TO CRITICAL ILLNESS   PHYSICAL EXAMINATION:  GENERAL:critically ill appearing, +resp distress EYES: Pupils equal, round, reactive to light.  No scleral icterus.  MOUTH: Moist mucosal membrane. INTUBATED NECK: Supple.  PULMONARY: Lungs clear to auscultation, +rhonchi, +wheezing CARDIOVASCULAR: S1 and S2.  Regular rate and rhythm GASTROINTESTINAL: Soft, nontender, -distended. Positive bowel sounds.  MUSCULOSKELETAL: edema.  NEUROLOGIC: obtunded, SKIN:normal, warm to touch, Capillary refill delayed  Pulses present bilaterally    Critical care provider statement:   Total critical care time: 31 minutes   Performed by: Karna Christmas MD   Critical care time was exclusive of separately billable procedures and treating other patients.   Critical care was necessary to treat or prevent imminent or life-threatening deterioration.   Critical care was time spent personally by me on the following activities: development of treatment plan with patient and/or surrogate as well as nursing, discussions with consultants, evaluation of patient's response to treatment, examination of patient, obtaining history from patient or surrogate, ordering and performing treatments and interventions, ordering and review of laboratory studies, ordering and review of radiographic studies, pulse oximetry and re-evaluation of patient's condition.    Vida Rigger, M.D.  Pulmonary & Critical Care Medicine

## 2023-10-01 ENCOUNTER — Inpatient Hospital Stay: Payer: 59

## 2023-10-01 LAB — GLUCOSE, CAPILLARY
Glucose-Capillary: 103 mg/dL — ABNORMAL HIGH (ref 70–99)
Glucose-Capillary: 108 mg/dL — ABNORMAL HIGH (ref 70–99)
Glucose-Capillary: 108 mg/dL — ABNORMAL HIGH (ref 70–99)
Glucose-Capillary: 110 mg/dL — ABNORMAL HIGH (ref 70–99)
Glucose-Capillary: 176 mg/dL — ABNORMAL HIGH (ref 70–99)
Glucose-Capillary: 234 mg/dL — ABNORMAL HIGH (ref 70–99)

## 2023-10-01 LAB — RENAL FUNCTION PANEL
Albumin: 2 g/dL — ABNORMAL LOW (ref 3.5–5.0)
Anion gap: 13 (ref 5–15)
BUN: 26 mg/dL — ABNORMAL HIGH (ref 8–23)
CO2: 31 mmol/L (ref 22–32)
Calcium: 8.9 mg/dL (ref 8.9–10.3)
Chloride: 98 mmol/L (ref 98–111)
Creatinine, Ser: 0.41 mg/dL — ABNORMAL LOW (ref 0.61–1.24)
GFR, Estimated: >60 mL/min (ref >60)
Glucose, Bld: 112 mg/dL — ABNORMAL HIGH (ref 70–99)
Phosphorus: 2.8 mg/dL (ref 2.5–4.6)
Potassium: 3.6 mmol/L (ref 3.5–5.1)
Sodium: 142 mmol/L (ref 135–145)

## 2023-10-01 LAB — CBC
HCT: 26.1 % — ABNORMAL LOW (ref 39.0–52.0)
Hemoglobin: 8.3 g/dL — ABNORMAL LOW (ref 13.0–17.0)
MCH: 30.1 pg (ref 26.0–34.0)
MCHC: 31.8 g/dL (ref 30.0–36.0)
MCV: 94.6 fL (ref 80.0–100.0)
Platelets: 288 10*3/uL (ref 150–400)
RBC: 2.76 MIL/uL — ABNORMAL LOW (ref 4.22–5.81)
RDW: 14.9 % (ref 11.5–15.5)
WBC: 9.1 10*3/uL (ref 4.0–10.5)
nRBC: 0 % (ref 0.0–0.2)

## 2023-10-01 LAB — C-REACTIVE PROTEIN: CRP: 10.3 mg/dL — ABNORMAL HIGH (ref <1.0)

## 2023-10-01 LAB — MAGNESIUM: Magnesium: 2.1 mg/dL (ref 1.7–2.4)

## 2023-10-01 MED ORDER — DOCUSATE SODIUM 50 MG/5ML PO LIQD
100.0000 mg | Freq: Two times a day (BID) | ORAL | Status: DC
  Filled 2023-10-01: qty 10

## 2023-10-01 MED ORDER — ACETAMINOPHEN 325 MG PO TABS: 650.0000 mg | ORAL_TABLET | Freq: Four times a day (QID) | ORAL | Status: DC | PRN

## 2023-10-01 MED ORDER — VITAMIN C 500 MG PO TABS
500.0000 mg | ORAL_TABLET | Freq: Two times a day (BID) | ORAL | Status: DC
  Administered 2023-10-03: 500 mg via ORAL
  Filled 2023-10-01 (×4): qty 1

## 2023-10-01 MED ORDER — ADULT MULTIVITAMIN W/MINERALS CH
1.0000 | ORAL_TABLET | Freq: Every day | ORAL | Status: DC
  Administered 2023-10-03: 1 via ORAL
  Filled 2023-10-01 (×3): qty 1

## 2023-10-01 MED ORDER — POLYETHYLENE GLYCOL 3350 17 G PO PACK: 17.0000 g | PACK | Freq: Every day | ORAL | Status: DC

## 2023-10-01 MED ORDER — CLONIDINE HCL 0.2 MG/24HR TD PTWK
0.2000 mg | MEDICATED_PATCH | TRANSDERMAL | Status: DC
  Administered 2023-10-01: 0.2 mg via TRANSDERMAL
  Filled 2023-10-01: qty 1

## 2023-10-01 MED ORDER — ENSURE ENLIVE PO LIQD
237.0000 mL | Freq: Three times a day (TID) | ORAL | Status: DC
  Administered 2023-10-01 – 2023-10-07 (×5): 237 mL via ORAL
  Administered 2023-10-08: 120 mL via ORAL
  Administered 2023-10-09 – 2023-10-19 (×20): 237 mL via ORAL

## 2023-10-01 MED ORDER — PANTOPRAZOLE SODIUM 40 MG IV SOLR
40.0000 mg | Freq: Every day | INTRAVENOUS | Status: DC
  Administered 2023-10-01 – 2023-10-24 (×22): 40 mg via INTRAVENOUS
  Filled 2023-10-01 (×22): qty 10

## 2023-10-01 NOTE — Evaluation (Signed)
Physical Therapy Evaluation Patient Details Name: Benjamin Bolton MRN: [id number] DOB: 1960-09-11 Today's Date: 10/01/2023  History of Present Illness  63 y.o. 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  Clinical Impression  Pt eager to work with therapy and was highly motivated t/o the session.  He present with significant weakness typically associated with prolonged intubation and he struggled with standing tolerance, +2 assist with walker reliance and limited tolerance.  He was able to maintain lightly assisted sitting EOB for many minutes but consistently needed hands on assist to maintain balance.  Pt is far from his baseline and will require further PT intervention to address functional limitations.        If plan is discharge home, recommend the following: A lot of help with walking and/or transfers;A lot of help with bathing/dressing/bathroom;Assist for transportation;Assistance with cooking/housework;Help with stairs or ramp for entrance   Can travel by private vehicle        Equipment Recommendations  (TBD at rehab)  Recommendations for Other Services       Functional Status Assessment Patient has had a recent decline in their functional status and demonstrates the ability to make significant improvements in function in a reasonable and predictable amount of time.     Precautions / Restrictions Precautions Precautions: Fall Restrictions Weight Bearing Restrictions Per Provider Order: No      Mobility  Bed Mobility Overal bed mobility: Needs Assistance Bed Mobility: Supine to Sit, Sit to Supine     Supine to sit: Mod assist Sit to supine: Max assist, +2 for physical assistance   General bed mobility comments: Good effort but need for  considerable assist    Transfers Overall transfer level: Needs assistance Equipment used: Rolling walker (2 wheels) Transfers: Sit to/from Stand Sit to Stand: Mod assist, +2 physical assistance           General transfer comment: Pt showed great effort to shift weight forward and up but ultiamtely needed modA to initiate rise as well as insure hips kept moving forward.  Good overall effort, tolerated standing ~30 seconds before needing to return to sitting    Ambulation/Gait         Gait velocity: unable to attempt this date        Stairs            Wheelchair Mobility     Tilt Bed    Modified Rankin (Stroke Patients Only)       Balance Overall balance assessment: Needs assistance Sitting-balance support: Bilateral upper extremity supported, Feet supported Sitting balance-Leahy Scale: Poor Sitting balance - Comments: Pt intermittently able to maintain sitting balance w/o direct assist but consistently leaning and unable to right himself needing hands on t/o   Standing balance support: Bilateral upper extremity supported, Reliant on assistive device for balance Standing balance-Leahy Scale: Poor Standing balance comment: +2 assist, heavy UE/walker reliance, poor tolerance                             Pertinent Vitals/Pain Pain Assessment Pain Assessment: No/denies pain    Home Living Family/patient expects to be discharged to:: Private residence Living Arrangements: Other relatives Available Help at Discharge: Family Type of Home: House Home Access: Stairs to enter Entrance Stairs-Rails: None Entrance [employer]  of Steps: 4     Home Equipment: BSC/3in1 Additional Comments: pt reports in last 6 months moved in with his sister    Prior Function Prior Level of Function : Independent/Modified Independent             Mobility Comments: limited household mobility ADLs Comments: sister drives to [employer], use of electric  shopping cart     Extremity/Trunk Assessment   Upper Extremity Assessment Upper Extremity Assessment: Defer to OT evaluation RUE Deficits / Details: grossly 2-/5, grip 3/5 LUE Deficits / Details: grossly 2-/5, grip 3/5    Lower Extremity Assessment Lower Extremity Assessment: Generalized weakness (unable to DF ankles through full range otherwise AROM t/o range with poor power/general weaknes typically associated with prolonged intubation)       Communication   Communication Communication:  (low volume)  Cognition Arousal: Alert Behavior During Therapy: WFL for tasks assessed/performed Overall Cognitive Status: Impaired/Different from baseline Area of Impairment: Orientation, Following commands                 Orientation Level: Disoriented to, Place     Following Commands: Follows one step commands with increased time                General Comments General comments (skin integrity, edema, etc.): Pt was eager to do all he could but is very weak and with poor activity tolerance    Exercises     Assessment/Plan    PT Assessment    PT Problem List         PT Treatment Interventions      PT Goals (Current goals can be found in the Care Plan section)  Acute Rehab PT Goals Patient Stated Goal: get stronger PT Goal Formulation: With patient Time For Goal Achievement: 10/14/23 Potential to Achieve Goals: Fair    Frequency Min 1X/week     Co-evaluation               AM-PAC PT "6 Clicks" Mobility  Outcome Measure Help needed turning from your back to your side while in a flat bed without using bedrails?: A Little Help needed moving from lying on your back to sitting on the side of a flat bed without using bedrails?: A Lot Help needed moving to and from a bed to a chair (including a wheelchair)?: A Lot Help needed standing up from a chair using your arms (e.g., wheelchair or bedside chair)?: A Lot Help needed to walk in hospital room?:  Total Help needed climbing 3-5 steps with a railing? : Total 6 Click Score: 11    End of Session Equipment Utilized During Treatment: Gait belt Activity Tolerance: Patient limited by fatigue Patient left: with call bell/phone within reach;in bed;with nursing/sitter in room Nurse Communication: Mobility status PT Visit Diagnosis: Unsteadiness on feet (R26.81);Muscle weakness (generalized) (M62.81);Difficulty in walking, not elsewhere classified (R26.2)    Time: 1610-9604 PT Time Calculation (min) (ACUTE ONLY): 14 min   Charges:   PT Evaluation $PT Eval Low Complexity: 1 Low   PT General Charges $$ ACUTE PT VISIT: 1 Visit         Malachi Pro, DPT 10/01/2023, 4:54 PM

## 2023-10-01 NOTE — Progress Notes (Signed)
Upon arrival to room Unique was present with son and sisters. Family left room. Chaplain had conversation with Leonette Most regarding his goals of care. He desires DNR/DNI due to decline. He desires son and sisters to be is Cytogeneticist. Chaplain assisted in getting POA forms completed. Chaplain offered prayer with Elmar and offered his family education. Family is aware of DNR/DNI status. One sisters has offered to take care of Carle as she has had experience with care giving. Chaplain updated RN and NP.     10/01/23 1900  Spiritual Encounters  Type of Visit Follow up  Care provided to: Pt and family  Reason for visit Goals of care meeting  OnCall Visit Yes

## 2023-10-01 NOTE — Progress Notes (Signed)
NAME:  Benjamin Bolton, MRN:  [id number], DOB:  Mar 30, 1961, LOS: 21 ADMISSION DATE:  09/10/2023, CONSULTATION DATE: 09/13/2023  Brief Pt Description / Synopsis:  63 y.o. 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   09/29/23- Extubated to BIPAP, I met with family at bedside and reviewed medical plan as well as answered questions.  09/30/23- patient is more awake and able to speak few words asking for water.  10/01/23- patient on HFNC 40/40 able to communicate verbally, will perform SLP and PT/OT eval. Req precedex for aggitation weaning.  CRP trending down and steroids tapering in parallel.  Pertinent  Medical History  DVT on Eliquis COPD on 2L O2 Type II Diabetes Mellitus  GERD HLD Pulmonary Nodule Tobacco Abuse   Micro Data:  1/8: SARS-CoV-2 PCR>>negative 1/8: Blood culture x2>> no growth 1/8: Urine>> multiple species (suggest recollection) 1/9: RVP>> + RSV 1/9: Sputum>> normal respiratory flora 1/9: HIV Screen>> non reactive 1/11: MRSA PCR>> negative 1/19: MRSA PCR>> 1/19: RVP>>  Antimicrobials:   Anti-infectives (From admission, onward)    Start     Dose/Rate Route Frequency Ordered Stop   09/29/23 2000  azithromycin (ZITHROMAX) 250 mg in dextrose 5 % 125 mL IVPB        250 mg 127.5 mL/hr over 60 Minutes Intravenous Every 24 hours 09/29/23 1801     09/29/23 1245  azithromycin (ZITHROMAX) tablet 250 mg  Status:  Discontinued        250 mg Per Tube Daily 09/29/23 1150 09/29/23 1801   09/25/23 1200  linezolid (ZYVOX) IVPB 600 mg  Status:  Discontinued        600 mg 300 mL/hr over 60 Minutes Intravenous Every 12 hours 09/25/23 0918 09/26/23 1206   09/25/23 1015  ceFEPIme (MAXIPIME) 2 g in sodium chloride 0.9 % 100 mL IVPB  Status:  Discontinued        2 g 200  mL/hr over 30 Minutes Intravenous Every 8 hours 09/25/23 0918 09/29/23 1053   09/25/23 0000  ceFAZolin (ANCEF) IVPB 2g/100 mL premix        2 g 200 mL/hr over 30 Minutes Intravenous  Once 09/23/23 1103 09/25/23 0013   09/22/23 0010  vancomycin (VANCOCIN) IVPB 750 mg/150 ml premix  Status:  Discontinued        750 mg 150 mL/hr over 60 Minutes Intravenous Every 12 hours 09/22/23 0004 09/22/23 1039   09/22/23 0000  Vancomycin (VANCOCIN) 750 mg IVPB  Status:  Discontinued        750 mg 150 mL/hr over 60 Minutes Intravenous Every 12 hours 09/21/23 1120 09/21/23 2356   09/22/23 0000  vancomycin (VANCOREADY) IVPB 750 mg/150 mL  Status:  Discontinued        750 mg 150 mL/hr over 60 Minutes Intravenous Every 12 hours 09/21/23 2356 09/22/23 0004   09/21/23 1300  vancomycin (VANCOCIN) IVPB 1000 mg/200 mL premix        1,000 mg 200 mL/hr over 60 Minutes Intravenous  Once 09/21/23 1120 09/21/23 1900   09/21/23 1215  piperacillin-tazobactam (ZOSYN) IVPB 3.375 g  Status:  Discontinued        3.375 g 12.5 mL/hr over 240 Minutes Intravenous Every 8 hours 09/21/23 1120 09/23/23 1257   09/21/23 1130  vancomycin (VANCOREADY) IVPB 1250 mg/250 mL  Status:  Discontinued  1,250 mg 166.7 mL/hr over 90 Minutes Intravenous  Once 09/21/23 1046 09/21/23 1120   09/21/23 1100  piperacillin-tazobactam (ZOSYN) IVPB 3.375 g  Status:  Discontinued        3.375 g 12.5 mL/hr over 240 Minutes Intravenous  Once 09/21/23 1046 09/21/23 1152   09/13/23 1445  piperacillin-tazobactam (ZOSYN) IVPB 3.375 g        3.375 g 12.5 mL/hr over 240 Minutes Intravenous Every 8 hours 09/13/23 1351 09/17/23 0157   09/10/23 2315  cefTRIAXone (ROCEPHIN) 1 g in sodium chloride 0.9 % 100 mL IVPB  Status:  Discontinued        1 g 200 mL/hr over 30 Minutes Intravenous Every 24 hours 09/10/23 2305 09/13/23 1336          REVIEW OF SYSTEMS   PATIENT IS UNABLE TO PROVIDE COMPLETE REVIEW OF SYSTEMS DUE TO SEVERE CRITICAL ILLNESS     PHYSICAL EXAMINATION:   GENERAL:critically ill appearing, +resp distress EYES: Pupils equal, round, reactive to light.  No scleral icterus.  MOUTH: Moist mucosal membrane. INTUBATED NECK: Supple.  PULMONARY: Lungs clear to auscultation, +rhonchi, +wheezing CARDIOVASCULAR: S1 and S2.  Regular rate and rhythm GASTROINTESTINAL: Soft, nontender, -distended. Positive bowel sounds.  MUSCULOSKELETAL: edema.  NEUROLOGIC: obtunded, SKIN:normal, warm to touch, Capillary refill delayed  Pulses present bilaterally      Significant Hospital Events: Including procedures, antibiotic start and stop dates in addition to other pertinent events   1/08: admit to [facility] 1/11: rapid response, transfer to ICU, intubated 1/12: remains vented, sedated, does not follow commands.  Failed SBT  1/13: Overnight due to concerns of aspiration TF's held.  Will repeat SBT today 1/14: Overnight pt had possible seizure activity with rhythmic tremors noted in the mouth/lower face/neck during episode pt unresponsive to pain.  Received 2 mg of versed and 2g keppra bolus with resolution of symptoms.  CT Head negative.  Standing dose of keppra 1g bid.  Neurology consulted.    1/15: On minimal vent settings, unable to tolerate WUA due to increased WOB. Shifting measures given for Hyperkalemia of 5.5 with peaked T waves.  Abdomen distended, KUB without evidence of obstruction, give Lactulose.  Diurese with 40 mg IV Lasix x1. 1/16: Failed WUA, with increased WOB and hypoxic with O2 sats dropping to mid 70's.  Add Seroquel.  Palliative Care consulted to assist with GOC. 1/17: Precedex started for WUA.  Failed SBT due to increased WOB, tachypnea, and hypoxia.  Increase Seroquel to 50 mg BID. Transition Heparin to Elqiuis 1/18: Pt remains mechanically intubated and has failed multiple SBT's.  Planning for family meeting today at bedside with palliative care and PCCM will perform SBT once family arrives at bedside.  Remains sedated with  propofol and fentanyl gtts.  Family meeting held code status changed to DNR per family request.  Pts family trying to decide if they want to proceed with tracheostomy.  Required additional sedation and prn vecuronium overnight due to vent dyssynchrony  1/19: Pt febrile overnight with increased vasopressor requirements levophed gtt @12  mcg/min and febrile tmax 101 degrees F concerning for recurrent sepsis.  Broad spectrum abx started. Pt remains mechanically intubated and unable to successfully liberate from ventilator  1/20 remains on vent, severe rap failure 1/21 remains on vent prolonged exp phase air trapping  on vent 1/22 remains on vent 1/23 CT ABD- Colonic ileus pattern shows significant improvement since the prior radiograph with no further significant gaseous distension of the colon. 1/23 CT chest B/L Lower lobe pneumonia  severe emphysema 1/23 CT head-possible cerebral edema 1/23 remains on vent failure to wean 1/24 remains intubated, severe hypoxia, shock 1/25 remains intubated, severe hypoxia, shock  Interim History / Subjective:   1.Acute on chronic hypoxemic respiratory failure          RSV pneumonia      - Extubated 09/29/23  2. Advanced COPD       Continue zithromax       S/p decadeon x 1 pre liberation , will start prednisone taper  3. PAF - dcd heparin gtt       Currentnly in SR  - on heprin sq   ENDO - ICU hypoglycemic\Hyperglycemia protocol -check FSBS per protocol     GI GI PROPHYLAXIS as indicated NUTRITIONAL STATUS DIET-->TF's as tolerated Constipation protocol as indicated CT ABD ILEUS IMPROVING   ELECTROLYTES -follow labs as needed -replace as needed -pharmacy consultation and following   RESTRICTIVE TRANSFUSION PROTOCOL TRANSFUSION  IF HGB<7  or ACTIVE BLEEDING OR DX of ACUTE CORONARY SYNDROMES     CT ABD ILEUS IMPROVING      Best Practice (right click and "Reselect all SmartList Selections" daily)    Diet/type: TF's DVT prophylaxis  heparin Pressure ulcer(s): N/A GI prophylaxis: Pepcid  Lines: RUE PICC Line and still needed  Foley:  External Catheter  Code Status: DNR  Objective   Blood pressure 124/85, pulse 66, temperature 98 F (36.7 C), temperature source Axillary, resp. rate (!) 21, height 6' (1.829 m), weight 61.1 kg, SpO2 100%.    FiO2 (%):  [40 %] 40 %   Intake/Output Summary (Last 24 hours) at 10/01/2023 0827 Last data filed at 10/01/2023 1610 Gross per 24 hour  Intake 468.3 ml  Output 650 ml  Net -181.7 ml   Filed Weights   09/29/23 0440 09/30/23 0500 10/01/23 0500  Weight: 60.1 kg 62 kg 61.1 kg       REVIEW OF SYSTEMS  PATIENT IS UNABLE TO PROVIDE COMPLETE REVIEW OF SYSTEMS DUE TO CRITICAL ILLNESS   PHYSICAL EXAMINATION:  GENERAL:critically ill appearing,  EYES: Pupils equal, round, reactive to light.  No scleral icterus.  MOUTH: Moist mucosal membrane.on HFNC NECK: Supple.  PULMONARY: Lungs clear to auscultation, +rhonchi, +wheezing CARDIOVASCULAR: S1 and S2.  Regular rate and rhythm GASTROINTESTINAL: Soft, nontender, -distended. Positive bowel sounds.  MUSCULOSKELETAL: edema.  NEUROLOGIC: obtunded, SKIN:normal, warm to touch, Capillary refill delayed  Pulses present bilaterally    Critical care provider statement:   Total critical care time: 31 minutes   Performed by: Karna Christmas MD   Critical care time was exclusive of separately billable procedures and treating other patients.   Critical care was necessary to treat or prevent imminent or life-threatening deterioration.   Critical care was time spent personally by me on the following activities: development of treatment plan with patient and/or surrogate as well as nursing, discussions with consultants, evaluation of patient's response to treatment, examination of patient, obtaining history from patient or surrogate, ordering and performing treatments and interventions, ordering and review of laboratory studies, ordering and  review of radiographic studies, pulse oximetry and re-evaluation of patient's condition.    Vida Rigger, M.D.  Pulmonary & Critical Care Medicine

## 2023-10-01 NOTE — Progress Notes (Signed)
PHARMACY CONSULT NOTE - FOLLOW UP  Pharmacy Consult for Electrolyte Monitoring and Replacement   Recent Labs: Potassium (mmol/L)  Date Value  10/01/2023 3.6   Magnesium (mg/dL)  Date Value  40/98/1191 2.1   Calcium (mg/dL)  Date Value  47/82/9562 8.9   Albumin (g/dL)  Date Value  13/04/6577 2.0 (L)   Phosphorus (mg/dL)  Date Value  46/96/2952 2.8   Sodium (mmol/L)  Date Value  10/01/2023 142     Assessment: 64 y/o male with h/o COPD, DVT, HLD, GERD, pulmonary nodule and DM who is admitted with RSV, pneumonia and COPD exacerbation. Pharmacy is asked to follow and replace electrolytes while in CCU  Goal of Therapy:  Electrolytes WNL  Plan:  ---no electrolyte replacement warranted today ---recheck electrolyte sin am  Benjamin Bolton ,PharmD Clinical Pharmacist 10/01/2023 7:21 AM

## 2023-10-01 NOTE — Evaluation (Signed)
Occupational Therapy Evaluation Patient Details Name: Benjamin Bolton MRN: [id number] DOB: Jul 02, 1961 Today's Date: 10/01/2023   History of Present Illness 63 y.o. male with PMHx significant for 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 admitted with Acute on Chronic Hypoxic and Hypercapnic Respiratory Failure in the setting of Acute COPD Exacerbation due to RSV Pneumonia requiring intubation and mechanical ventilation   Clinical Impression   Benjamin Bolton was seen for OT evaluation this date. Prior to hospital admission, pt was MOD I using 3L [place] at baseline per chart. Pt lives with sister. Pt presents to acute OT demonstrating impaired ADL performance and functional mobility 2/2 decreased activity tolerance and functional strength/balance deficits. Pt currently requires MOD A self-feeding at bed level. Intermittent CGA static sitting with BUE support however primarily needs MIN A for ~6 min sitting. MOD A x2 + RW sit<>stand at EOB, assist to facilitate upright posture. Desat in standing 80% SpO2 with poor pleth reading. Pt eager to continue trialing mobility however incontinent of stool in standing, returned to bed with RN to assist with linen change. Pt would benefit from skilled OT to address noted impairments and functional limitations (see below for any additional details). Upon hospital discharge, recommend OT follow up >3 hours/day.     If plan is discharge home, recommend the following: Two people to help with walking and/or transfers;A lot of help with bathing/dressing/bathroom;Help with stairs or ramp for entrance    Functional Status Assessment  Patient has had a recent decline in their functional status and demonstrates the ability to make significant improvements in function in a reasonable and predictable amount of time.  Equipment Recommendations  Other (comment) (defer)    Recommendations for Other Services       Precautions / Restrictions Precautions Precautions:  Fall Restrictions Weight Bearing Restrictions Per Provider Order: No      Mobility Bed Mobility Overal bed mobility: Needs Assistance Bed Mobility: Supine to Sit, Sit to Supine     Supine to sit: Mod assist Sit to supine: Max assist, +2 for physical assistance        Transfers Overall transfer level: Needs assistance Equipment used: Rolling walker (2 wheels) Transfers: Sit to/from Stand Sit to Stand: Mod assist, +2 physical assistance                  Balance Overall balance assessment: Needs assistance Sitting-balance support: Bilateral upper extremity supported, Feet supported Sitting balance-Leahy Scale: Poor Sitting balance - Comments: MIN A Postural control: Posterior lean (anterior) Standing balance support: Bilateral upper extremity supported, Reliant on assistive device for balance Standing balance-Leahy Scale: Poor                             ADL either performed or assessed with clinical judgement   ADL Overall ADL's : Needs assistance/impaired                                       General ADL Comments: MOD A self-feeding at bed level, MAX A oral care in sitting as pt requires BUE support for static balance. MOD A x2 + RW for simulated BSC t/f      Pertinent Vitals/Pain Pain Assessment Pain Assessment: Faces Faces Pain Scale: No hurt     Extremity/Trunk Assessment Upper Extremity Assessment Upper Extremity Assessment: RUE deficits/detail;LUE deficits/detail RUE Deficits / Details: grossly 2-/5, grip  3/5 LUE Deficits / Details: grossly 2-/5, grip 3/5   Lower Extremity Assessment Lower Extremity Assessment: Generalized weakness       Communication Communication Communication:  (soft spoken)   Cognition Arousal: Alert Behavior During Therapy: WFL for tasks assessed/performed Overall Cognitive Status: Impaired/Different from baseline Area of Impairment: Orientation, Following commands                  Orientation Level: Disoriented to, Place     Following Commands: Follows one step commands with increased time                        Home Living Family/patient expects to be discharged to:: Private residence Living Arrangements: Other relatives (sister) Available Help at Discharge: Family Type of Home: House Home Access: Stairs to enter Secretary/administrator of Steps: 4                       Additional Comments: pt reports in last 6 months moved in with his sister      Prior Functioning/Environment Prior Level of Function : Independent/Modified Independent             Mobility Comments: limited household mobility ADLs Comments: sister drives to [employer], use of electric shopping cart        OT Problem List: Decreased range of motion;Decreased strength;Decreased activity tolerance;Impaired balance (sitting and/or standing);Decreased safety awareness      OT Treatment/Interventions: Self-care/ADL training;Therapeutic exercise;Energy conservation;DME and/or AE instruction;Therapeutic activities    OT Goals(Current goals can be found in the care plan section) Acute Rehab OT Goals Patient Stated Goal: to walk OT Goal Formulation: With patient Time For Goal Achievement: 10/15/23 Potential to Achieve Goals: Good ADL Goals Pt Will Perform Grooming: with modified independence;sitting Pt Will Perform Lower Body Dressing: with min assist;sit to/from stand Pt Will Transfer to Toilet: with min assist;ambulating;bedside commode  OT Frequency: Min 1X/week    Co-evaluation              AM-PAC OT "6 Clicks" Daily Activity     Outcome Measure Help from another person eating meals?: A Lot Help from another person taking care of personal grooming?: A Lot Help from another person toileting, which includes using toliet, bedpan, or urinal?: A Lot Help from another person bathing (including washing, rinsing, drying)?: A Lot Help from another person to  put on and taking off regular upper body clothing?: A Lot Help from another person to put on and taking off regular lower body clothing?: A Lot 6 Click Score: 12   End of Session Equipment Utilized During Treatment: Rolling walker (2 wheels);Gait belt Nurse Communication: Mobility status  Activity Tolerance: Patient tolerated treatment well Patient left: in bed;with call bell/phone within reach;with nursing/sitter in room  OT Visit Diagnosis: Other abnormalities of gait and mobility (R26.89);Muscle weakness (generalized) (M62.81)                Time: 2956-2130 OT Time Calculation (min): 19 min Charges:  OT General Charges $OT Visit: 1 Visit OT Evaluation $OT Eval Moderate Complexity: 1 Mod  Kathie Dike, M.S. OTR/L  10/01/23, 3:49 PM  ascom [phone]

## 2023-10-01 NOTE — Evaluation (Signed)
Clinical/Bedside Swallow Evaluation Patient Details  Name: Benjamin Bolton MRN: [id number] Date of Birth: 1961-04-09  Today's Date: 10/01/2023 Time: SLP Start Time (ACUTE ONLY): 1330 SLP Stop Time (ACUTE ONLY): 1430 SLP Time Calculation (min) (ACUTE ONLY): 60 min  Past Medical History:  Past Medical History:  Diagnosis Date   COPD (chronic obstructive pulmonary disease) (HCC)    DVT (deep venous thrombosis) (HCC)    Dyspnea    GERD (gastroesophageal reflux disease)    no meds   HLD (hyperlipidemia)    Hx of migraines    Oxygen dependent    3 L Benjamin Bolton   Pneumonia 2023   Pre-diabetes    Pulmonary nodule    Rotator cuff tear, right    Tobacco abuse    Tobacco use    Past Surgical History:  Past Surgical History:  Procedure Laterality Date   CHEST TUBE INSERTION     SHOULDER ARTHROSCOPY WITH SUBACROMIAL DECOMPRESSION, ROTATOR CUFF REPAIR AND BICEP TENDON REPAIR Right 02/18/2023   Procedure: RIGHT SHOULDER ARTHROSCOPY WITH DEBRIDEMENT, DECOMPRESSION, ROTATOR CUFF REPAIR, BICEPS TENODESIS.;  Surgeon: Christena Flake, MD;  Location: [facility] ORS;  Service: Orthopedics;  Laterality: Right;   HPI:  Pt is a 63 y.o. 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   At initial admit, patient states that he has a shortness of breath for more than 3 days, which has been progressively worsening.  He has cough with thick mucus production.  He has chills, but no fever.  No chest pain.  Of note, patient is using 3 L oxygen normally at home, but was found to have acute respiratory distress, difficulty speaking in full sentence, oxygen desaturation to 80s, initially started on 5-6 L oxygen, which is weaned off to home level 3 L oxygen after giving IV Solu-Medrol and nebulizer in ED.  Patient was admitted to [facility] and  initiated on nebulizers, steroids, and antibiotics. He was managed medically for COPD exacerbation and UTI but developed worsening respiratory distress this AM requiring the initiation of BiPAP. Rapid response was called and he was transferred to the ICU. Initial blood gas showed pH of 7.2, with repeat VBG showing he remained acidemic at 7.18. Given altered mental status and respiratory failure, decision made at the bedside to proceed with emergent intubation.  During the lengthy intubation period, there was concern of aspiration of TFs; also sepsis.  ENT was consulted for trach, PEG.  Palliative Care consulted; Family trying to decide if they want to proceed with tracheostomy.  Pt was extubated on 09/29/2023 per MD note: "Pt extubated to bipap today." w/ no plan to reintubate per report.  On 09/30/23- "patient is more awake and able to speak few words asking for water." per MD note.  ST consult was placed on 09/30/23 for CSE.    Assessment / Plan / Recommendation  Clinical Impression   Pt seen for BSE today. pt awake, verbal w/ Dysphonia s/p lengthy oral intubation/extubation. Pt followed basic commands; answered basic questions re: self. Cues given intermittently.  On HFNC O2 support 40L; FiO2 40%. Afebrile. WBC WNL.   Pt appears to present w/ grossly functional oropharyngeal phase swallowing w/ No immediate, oropharyngeal phase dysphagia noted; No overt neuromuscular deficits noted w/ trials given. Pt consumed po trials w/ No immediate, overt clinical s/s of  aspiration during the trials.  Pt appears at reduced risk for aspiration when following aspiration precautions using a modified diet consistency. However, pt does have challenging factors that could impact oropharyngeal swallowing to include lengthy oral intubation/extubation, acute/chronic illness, deconditioning/weakness, and need for feeding support. These factors can increase risk for aspiration, dysphagia as well as decreased oral intake overall.    During po trials, pt consumed consistencies given w/ no immediate, overt coughing, decline in baseline vocal quality, or change in respiratory presentation during/post trials. O2 sats remained 100%. No change in pt's dysphonic vocal quality. Oral phase appeared [facility] w/ timely bolus management and control of bolus propulsion for A-P transfer for swallowing. Oral clearing achieved w/ all trial consistencies.  OM Exam appeared [facility] w/ no unilateral weakness noted. Speech Clear. Pt attempted to help feed self w/ support given.   Recommend initiation of a Pureed consistency diet w/ well-moistened foods; Thin liquids -- carefully monitor straw use and Pinch for small sips slowly, and pt should help to Hold Cup when drinking. Recommend general aspiration precautions, reduce talking/distractions during oral intake. Feeding Support at meals. Rest Breaks as needed. Pills CRUSHED in Puree for safer, easier swallowing -- now and for D/C.  Education given on Pills in Puree; food consistencies and prep currently; general aspiration precautions to pt and NSG. Practiced small, single sips slowly w/ pt. ST services will continue to follow for toleration of diet and trials to upgrade diet as able to. NSG updated, agreed. MD updated. Recommend Dietician f/u for support. SLP Visit Diagnosis: Dysphagia, unspecified (R13.10) (lengthy oral intubation)    Aspiration Risk  Mild aspiration risk;Risk for inadequate nutrition/hydration    Diet Recommendation   Thin;Dysphagia 1 (puree) = a Pureed consistency diet w/ well-moistened foods; Thin liquids -- carefully monitor straw use and Pinch for small sips slowly, and pt should help to Hold Cup when drinking. Recommend general aspiration precautions, reduce talking/distractions during oral intake. Feeding Support at meals. Rest Breaks as needed.   Medication Administration: Crushed with puree    Other  Recommendations Recommended Consults:  (Dietician) Oral Care Recommendations:  Oral care BID;Oral care before and after PO;Staff/trained caregiver to provide oral care    Recommendations for follow up therapy are one component of a multi-disciplinary discharge planning process, led by the attending physician.  Recommendations may be updated based on patient status, additional functional criteria and insurance authorization.  Follow up Recommendations Follow physician's recommendations for discharge plan and follow up therapies (TBD)      Assistance Recommended at Discharge  FULL  Functional Status Assessment Patient has had a recent decline in their functional status and demonstrates the ability to make significant improvements in function in a reasonable and predictable amount of time.  Frequency and Duration min 2x/week  2 weeks       Prognosis Prognosis for improved oropharyngeal function: Fair (-Good) Barriers to Reach Goals: Time post onset;Severity of deficits Barriers/Prognosis Comment: severe weakness; feeding support needed      Swallow Study   General Date of Onset: 09/10/23 HPI: Pt is a 63 y.o. 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   At initial admit, patient states that he has a shortness of breath for more than 3 days, which has been progressively worsening.  He has cough with thick  mucus production.  He has chills, but no fever.  No chest pain.  Of note, patient is using 3 L oxygen normally at home, but was found to have acute respiratory distress, difficulty speaking in full sentence, oxygen desaturation to 80s, initially started on 5-6 L oxygen, which is weaned off to home level 3 L oxygen after giving IV Solu-Medrol and nebulizer in ED.  Patient was admitted to [facility] and initiated on nebulizers, steroids, and antibiotics. He was managed medically  for COPD exacerbation and UTI but developed worsening respiratory distress this AM requiring the initiation of BiPAP. Rapid response was called and he was transferred to the ICU. Initial blood gas showed pH of 7.2, with repeat VBG showing he remained acidemic at 7.18. Given altered mental status and respiratory failure, decision made at the bedside to proceed with emergent intubation.  During the lengthy intubation period, there was concern of aspiration of TFs; also sepsis.  ENT was consulted for trach, PEG.  Palliative Care consulted; Family trying to decide if they want to proceed with tracheostomy.  Pt was extubated on 09/29/2023 per MD note: "Pt extubated to bipap today." w/ no plan to reintubate per report.  On 09/30/23- "patient is more awake and able to speak few words asking for water." per MD note.  ST consult was placed on 09/30/23 for CSE. Type of Study: Bedside Swallow Evaluation Previous Swallow Assessment: none Diet Prior to this Study: NPO Temperature Spikes Noted: No (wbc 9.1) Respiratory Status: Nasal cannula (HFNC 40% FiO2; 40L) History of Recent Intubation: Yes Total duration of intubation (days): 16 days Date extubated: 09/29/23 Behavior/Cognition: Alert;Cooperative;Pleasant mood;Distractible;Requires cueing (functional) Oral Cavity Assessment: Within Functional Limits Oral Care Completed by SLP: Yes Oral Cavity - Dentition: Missing dentition (only few) Vision: Functional for self-feeding Self-Feeding Abilities: Able to feed self;Needs assist;Needs set up;Total assist (UE weakness bilat) Patient Positioning: Upright in bed (MAX support) Baseline Vocal Quality: Aphonic (>Dysphonic) Volitional Cough: Weak Volitional Swallow: Able to elicit    Oral/Motor/Sensory Function Overall Oral Motor/Sensory Function: Within functional limits   Ice Chips Ice chips: Within functional limits Presentation: Spoon (fed; 8 trials)   Thin Liquid Thin Liquid: Within functional limits  (grossly) Presentation: Self Fed;Straw (supported by SLP: 10+ trials>~4 ozs total) Other Comments: mild cough noted x1 b/t trials    Nectar Thick Nectar Thick Liquid: Not tested   Honey Thick Honey Thick Liquid: Not tested   Puree Puree: Within functional limits Presentation: Spoon (fed; 15 trials)   Solid     Solid: Not tested       Jerilynn Som, MS, CCC-SLP Speech Language Pathologist Rehab Services; [facility] [place] [phone] (ascom) Navy Belay 10/01/2023,4:40 PM

## 2023-10-01 NOTE — Plan of Care (Signed)
  Problem: Education: Goal: Knowledge of General Education information will improve Description: Including pain rating scale, medication(s)/side effects and non-pharmacologic comfort measures Outcome: Progressing   Problem: Health Behavior/Discharge Planning: Goal: Ability to manage health-related needs will improve Outcome: Progressing   Problem: Clinical Measurements: Goal: Ability to maintain clinical measurements within normal limits will improve Outcome: Progressing Goal: Will remain free from infection Outcome: Progressing Goal: Diagnostic test results will improve Outcome: Progressing Goal: Respiratory complications will improve Outcome: Progressing Goal: Cardiovascular complication will be avoided Outcome: Progressing   Problem: Activity: Goal: Risk for activity intolerance will decrease Outcome: Progressing   Problem: Nutrition: Goal: Adequate nutrition will be maintained Outcome: Progressing   Problem: Coping: Goal: Level of anxiety will decrease Outcome: Progressing   Problem: Elimination: Goal: Will not experience complications related to bowel motility Outcome: Progressing Goal: Will not experience complications related to urinary retention Outcome: Progressing   Problem: Pain Management: Goal: General experience of comfort will improve Outcome: Progressing   Problem: Safety: Goal: Ability to remain free from injury will improve Outcome: Progressing   Problem: Skin Integrity: Goal: Risk for impaired skin integrity will decrease Outcome: Progressing   Problem: Education: Goal: Knowledge of disease or condition will improve Outcome: Progressing Goal: Knowledge of the prescribed therapeutic regimen will improve Outcome: Progressing   Problem: Activity: Goal: Ability to tolerate increased activity will improve Outcome: Progressing Goal: Will verbalize the importance of balancing activity with adequate rest periods Outcome: Progressing   Problem:  Respiratory: Goal: Ability to maintain a clear airway will improve Outcome: Progressing Goal: Levels of oxygenation will improve Outcome: Progressing Goal: Ability to maintain adequate ventilation will improve Outcome: Progressing   Problem: Education: Goal: Ability to describe self-care measures that may prevent or decrease complications (Diabetes Survival Skills Education) will improve Outcome: Progressing   Problem: Coping: Goal: Ability to adjust to condition or change in health will improve Outcome: Progressing   Problem: Fluid Volume: Goal: Ability to maintain a balanced intake and output will improve Outcome: Progressing   Problem: Health Behavior/Discharge Planning: Goal: Ability to identify and utilize available resources and services will improve Outcome: Progressing Goal: Ability to manage health-related needs will improve Outcome: Progressing   Problem: Metabolic: Goal: Ability to maintain appropriate glucose levels will improve Outcome: Progressing   Problem: Nutritional: Goal: Maintenance of adequate nutrition will improve Outcome: Progressing Goal: Progress toward achieving an optimal weight will improve Outcome: Progressing   Problem: Skin Integrity: Goal: Risk for impaired skin integrity will decrease Outcome: Progressing   Problem: Tissue Perfusion: Goal: Adequacy of tissue perfusion will improve Outcome: Progressing   Problem: Activity: Goal: Ability to tolerate increased activity will improve Outcome: Progressing   Problem: Respiratory: Goal: Ability to maintain a clear airway and adequate ventilation will improve Outcome: Progressing   Problem: Role Relationship: Goal: Method of communication will improve Outcome: Progressing

## 2023-10-01 NOTE — Progress Notes (Signed)
Nutrition Follow Up Note   DOCUMENTATION CODES:   Non-severe (moderate) malnutrition in context of chronic illness  INTERVENTION:   Ensure Enlive po TID, each supplement provides 350 kcal and 20 grams of protein.  Magic cup TID with meals, each supplement provides 290 kcal and 9 grams of protein  MVI, folic acid and thiamine po daily   Vitamin C 500mg  po BID   Daily weights   NUTRITION DIAGNOSIS:   Moderate Malnutrition related to chronic illness (pt sedated and ventilated) as evidenced by moderate fat depletion, moderate muscle depletion. -ongoing   GOAL:   Patient will meet greater than or equal to 90% of their needs -previously met with tube feeds  MONITOR:   Diet advancement, Labs, Weight trends, I & O's, Skin  ASSESSMENT:   63 y/o male with h/o COPD, DVT, HLD, GERD, pulmonary nodule and DM who is admitted with RSV, pneumonia and COPD exacerbation.  Met with pt in room today. Pt reports that he is feeling better. Pt is asking for something to drink. Pt seen by SLP today and was placed on a diet. RD discussed with pt the importance of adequate nutrition needed to preserve lean muscle. Pt is agreeable to drinking Ensure. RD will add supplements to help pt meet his estimated needs. Pt remains at refeed risk. Per chart, pt is weight stable since admission. Pt +4.9L on his I & Os.   Medications reviewed and include: colace, heparin, solu-medrol, MVI, protonix, miralax, azithromycin  Labs reviewed: K 3.6 wnl, BUN 26(H), creat 0.41(L), P 2.8 wnl, Mg 2.1 wnl Hgb 8.3(L), Hct 26.1(L) Cbgs- 110, 108 x 24 hrs   UOP-   Diet Order:   Diet Order             DIET - DYS 1 Room service appropriate? Yes with Assist; Fluid consistency: Thin  Diet effective now                  EDUCATION NEEDS:   No education needs have been identified at this time  Skin:  Skin Assessment: Reviewed RN Assessment (MASD)  Last BM:  1/28- type 7  Height:   Ht Readings from Last 1  Encounters:  09/10/23 6' (1.829 m)    Weight:   Wt Readings from Last 1 Encounters:  10/01/23 61.1 kg    Ideal Body Weight:  80.9 kg  BMI:  Body mass index is 18.27 kg/m.  Estimated Nutritional Needs:   Kcal:  1900-2200kcal/day  Protein:  95-110g/day  Fluid:  1.8-2.1L/day  Betsey Holiday MS, RD, LDN If unable to be reached, please send secure chat to "RD inpatient" available from 8:00a-4:00p daily

## 2023-10-01 NOTE — Progress Notes (Signed)
The patient called this nurse to the bedside. Patient requested to fill out a form to have his son, Minerva Areola and the patient's sister, Stanton Kidney, to carry out his medical decisions. This nurse contacted the hospital Chaplain to discuss and complete necessary paperwork. This nurse messaged Palliative care to follow up on discussion of goals of care, as patient is able to discuss at this time. Provider aware of above.

## 2023-10-01 NOTE — Progress Notes (Signed)
Inpatient Rehab Admissions Coordinator:  ? ?Per therapy recommendations,  patient was screened for CIR candidacy by Megan Salon, MS, CCC-SLP. At this time, Pt. Appears to be a a potential candidate for CIR. I will place   order for rehab consult per protocol for full assessment. Please contact me any with questions. ? ?Megan Salon, MS, CCC-SLP ?Rehab Admissions Coordinator  ?[phone] (celll) ?[phone] (office) ? ?

## 2023-10-02 ENCOUNTER — Inpatient Hospital Stay: Payer: 59

## 2023-10-02 DIAGNOSIS — J441 Chronic obstructive pulmonary disease with (acute) exacerbation: Secondary | ICD-10-CM | POA: Diagnosis not present

## 2023-10-02 DIAGNOSIS — E440 Moderate protein-calorie malnutrition: Secondary | ICD-10-CM | POA: Diagnosis not present

## 2023-10-02 DIAGNOSIS — Z515 Encounter for palliative care: Secondary | ICD-10-CM | POA: Diagnosis not present

## 2023-10-02 DIAGNOSIS — J9621 Acute and chronic respiratory failure with hypoxia: Secondary | ICD-10-CM | POA: Diagnosis not present

## 2023-10-02 LAB — MAGNESIUM: Magnesium: 1.8 mg/dL (ref 1.7–2.4)

## 2023-10-02 LAB — CBC
HCT: 26.5 % — ABNORMAL LOW (ref 39.0–52.0)
Hemoglobin: 8.6 g/dL — ABNORMAL LOW (ref 13.0–17.0)
MCH: 30.4 pg (ref 26.0–34.0)
MCHC: 32.5 g/dL (ref 30.0–36.0)
MCV: 93.6 fL (ref 80.0–100.0)
Platelets: 324 10*3/uL (ref 150–400)
RBC: 2.83 MIL/uL — ABNORMAL LOW (ref 4.22–5.81)
RDW: 15 % (ref 11.5–15.5)
WBC: 8.7 10*3/uL (ref 4.0–10.5)
nRBC: 0 % (ref 0.0–0.2)

## 2023-10-02 LAB — BASIC METABOLIC PANEL
Anion gap: 10 (ref 5–15)
BUN: 16 mg/dL (ref 8–23)
CO2: 30 mmol/L (ref 22–32)
Calcium: 8.4 mg/dL — ABNORMAL LOW (ref 8.9–10.3)
Chloride: 101 mmol/L (ref 98–111)
Creatinine, Ser: 0.41 mg/dL — ABNORMAL LOW (ref 0.61–1.24)
GFR, Estimated: >60 mL/min (ref >60)
Glucose, Bld: 84 mg/dL (ref 70–99)
Potassium: 3.3 mmol/L — ABNORMAL LOW (ref 3.5–5.1)
Sodium: 141 mmol/L (ref 135–145)

## 2023-10-02 LAB — GLUCOSE, CAPILLARY
Glucose-Capillary: 78 mg/dL (ref 70–99)
Glucose-Capillary: 82 mg/dL (ref 70–99)
Glucose-Capillary: 73 mg/dL (ref 70–99)
Glucose-Capillary: 125 mg/dL — ABNORMAL HIGH (ref 70–99)

## 2023-10-02 LAB — PHOSPHORUS: Phosphorus: 2.1 mg/dL — ABNORMAL LOW (ref 2.5–4.6)

## 2023-10-02 LAB — C-REACTIVE PROTEIN: CRP: 6.2 mg/dL — ABNORMAL HIGH (ref <1.0)

## 2023-10-02 MED ORDER — K PHOS MONO-SOD PHOS DI & MONO 155-852-130 MG PO TABS
500.0000 mg | ORAL_TABLET | Freq: Once | ORAL | Status: AC
Start: 2023-10-02 — End: 2023-10-02
  Administered 2023-10-02: 500 mg via ORAL
  Filled 2023-10-02: qty 2

## 2023-10-02 MED ORDER — PREDNISONE 20 MG PO TABS
45.0000 mg | ORAL_TABLET | Freq: Every day | ORAL | Status: AC
  Filled 2023-10-02: qty 1

## 2023-10-02 MED ORDER — PREDNISONE 20 MG PO TABS
50.0000 mg | ORAL_TABLET | Freq: Every day | ORAL | Status: AC
  Administered 2023-10-03: 50 mg via ORAL
  Filled 2023-10-02: qty 1

## 2023-10-02 MED ORDER — PREDNISONE 10 MG PO TABS
10.0000 mg | ORAL_TABLET | Freq: Every day | ORAL | Status: DC
Start: 2023-10-11 — End: 2023-10-05

## 2023-10-02 MED ORDER — PREDNISONE 10 MG PO TABS: 30.0000 mg | ORAL_TABLET | Freq: Every day | ORAL | Status: DC

## 2023-10-02 MED ORDER — PREDNISONE 20 MG PO TABS
40.0000 mg | ORAL_TABLET | Freq: Every day | ORAL | Status: AC
  Administered 2023-10-05: 40 mg via ORAL
  Filled 2023-10-02: qty 2

## 2023-10-02 MED ORDER — PREDNISONE 10 MG PO TABS: 5.0000 mg | ORAL_TABLET | Freq: Every day | ORAL | Status: DC

## 2023-10-02 MED ORDER — POTASSIUM CHLORIDE 20 MEQ PO PACK
40.0000 meq | PACK | ORAL | Status: AC
  Administered 2023-10-02 (×2): 40 meq via ORAL
  Filled 2023-10-02 (×2): qty 2

## 2023-10-02 MED ORDER — APIXABAN 5 MG PO TABS
5.0000 mg | ORAL_TABLET | Freq: Two times a day (BID) | ORAL | Status: DC
Start: 2023-10-02 — End: 2023-10-05
  Administered 2023-10-02 – 2023-10-05 (×4): 5 mg via ORAL
  Filled 2023-10-02 (×5): qty 1

## 2023-10-02 MED ORDER — PREDNISONE 20 MG PO TABS: 20.0000 mg | ORAL_TABLET | Freq: Every day | ORAL | Status: DC

## 2023-10-02 MED ORDER — PREDNISONE 20 MG PO TABS: 25.0000 mg | ORAL_TABLET | Freq: Every day | ORAL | Status: DC

## 2023-10-02 MED ORDER — POTASSIUM CHLORIDE 10 MEQ/50ML IV SOLN
10.0000 meq | INTRAVENOUS | Status: AC
Start: 2023-10-02 — End: 2023-10-02
  Administered 2023-10-02 (×3): 10 meq via INTRAVENOUS
  Filled 2023-10-02 (×4): qty 50

## 2023-10-02 MED ORDER — PREDNISONE 10 MG PO TABS
15.0000 mg | ORAL_TABLET | Freq: Every day | ORAL | Status: DC
Start: 2023-10-10 — End: 2023-10-05

## 2023-10-02 MED ORDER — PREDNISONE 10 MG PO TABS: 35.0000 mg | ORAL_TABLET | Freq: Every day | ORAL | Status: DC

## 2023-10-02 NOTE — Progress Notes (Signed)
SLP Cancellation Note  Patient Details Name: Benjamin Bolton MRN: [id number] DOB: May 16, 1961   Cancelled treatment:       Reason Eval/Treat Not Completed: Patient declined, no reason specified (offered 2x; NSG present)  Per NSG notes, pt has refused all oral intake today, including meds. Met w/ pt 2x offering po trials, dysphagia tx. Pt declined both times stating he did not want anything, "no". Pt was fully awake, alert. He denied any pain but noted increased HR(120s) and RR(30s). He is currently on 4L O2 support. NSG present.  ST services will f/u tomorrow w/ ongoing assessment of swallowing. Discussed aspiration precautions w/ pt and NSG including stopping po's if increased coughing or s/s of aspiration noted w/ intake. Precautions posted on board in room, chart.      Jerilynn Som, MS, CCC-SLP Speech Language Pathologist Rehab Services; [facility] [phone] (ascom) Mazell Aylesworth 10/02/2023, 3:04 PM

## 2023-10-02 NOTE — Progress Notes (Signed)
Called bedside after being alerted by nursing that the patient is requesting to leave AMA. His sister was alerted and she reported that he would not have oxygen available tonight at home. Upon bedside discussion and assessment the patient was alert and responsive, but confused. He thought he saw his son outside the room. And when asked the date he replied, April 03, 2024. When asked his birthday he replied the 19th of this month, then said the month was February and today was important because it was pay day, asking for someone to "help me to my car".  I relayed to his sister that the patient was too confused to sign out AMA and physically too weak to leave under his own weight. Explaining that is was not in his best interest to leave the hospital. She is in agreement. I explained to Mr Biby that he will have to stay overnight, and we can re-evaluate in the morning.   Cheryll Cockayne Rust-Chester, AGACNP-BC Acute Care Nurse Practitioner [facility]   [phone] / [phone] Please see Amion for details.

## 2023-10-02 NOTE — Plan of Care (Signed)
Problem: Education: Goal: Knowledge of General Education information will improve Description: Including pain rating scale, medication(s)/side effects and non-pharmacologic comfort measures Outcome: Not Progressing   Problem: Health Behavior/Discharge Planning: Goal: Ability to manage health-related needs will improve Outcome: Not Progressing   Problem: Clinical Measurements: Goal: Ability to maintain clinical measurements within normal limits will improve Outcome: Not Progressing Goal: Will remain free from infection Outcome: Not Progressing Goal: Diagnostic test results will improve Outcome: Not Progressing Goal: Respiratory complications will improve Outcome: Not Progressing Goal: Cardiovascular complication will be avoided Outcome: Not Progressing   Problem: Activity: Goal: Risk for activity intolerance will decrease Outcome: Not Progressing   Problem: Nutrition: Goal: Adequate nutrition will be maintained Outcome: Not Progressing   Problem: Coping: Goal: Level of anxiety will decrease Outcome: Not Progressing   Problem: Elimination: Goal: Will not experience complications related to bowel motility Outcome: Not Progressing Goal: Will not experience complications related to urinary retention Outcome: Not Progressing   Problem: Pain Management: Goal: General experience of comfort will improve Outcome: Not Progressing   Problem: Safety: Goal: Ability to remain free from injury will improve Outcome: Not Progressing   Problem: Skin Integrity: Goal: Risk for impaired skin integrity will decrease Outcome: Not Progressing   Problem: Education: Goal: Knowledge of disease or condition will improve Outcome: Not Progressing Goal: Knowledge of the prescribed therapeutic regimen will improve Outcome: Not Progressing   Problem: Activity: Goal: Ability to tolerate increased activity will improve Outcome: Not Progressing Goal: Will verbalize the importance of  balancing activity with adequate rest periods Outcome: Not Progressing   Problem: Respiratory: Goal: Ability to maintain a clear airway will improve Outcome: Not Progressing Goal: Levels of oxygenation will improve Outcome: Not Progressing Goal: Ability to maintain adequate ventilation will improve Outcome: Not Progressing   Problem: Education: Goal: Ability to describe self-care measures that may prevent or decrease complications (Diabetes Survival Skills Education) will improve Outcome: Not Progressing   Problem: Coping: Goal: Ability to adjust to condition or change in health will improve Outcome: Not Progressing   Problem: Fluid Volume: Goal: Ability to maintain a balanced intake and output will improve Outcome: Not Progressing   Problem: Health Behavior/Discharge Planning: Goal: Ability to identify and utilize available resources and services will improve Outcome: Not Progressing Goal: Ability to manage health-related needs will improve Outcome: Not Progressing   Problem: Metabolic: Goal: Ability to maintain appropriate glucose levels will improve Outcome: Not Progressing   Problem: Nutritional: Goal: Maintenance of adequate nutrition will improve Outcome: Not Progressing Goal: Progress toward achieving an optimal weight will improve Outcome: Not Progressing   Problem: Skin Integrity: Goal: Risk for impaired skin integrity will decrease Outcome: Not Progressing   Problem: Tissue Perfusion: Goal: Adequacy of tissue perfusion will improve Outcome: Not Progressing   Problem: Activity: Goal: Ability to tolerate increased activity will improve Outcome: Not Progressing   Problem: Respiratory: Goal: Ability to maintain a clear airway and adequate ventilation will improve Outcome: Not Progressing   Problem: Role Relationship: Goal: Method of communication will improve Outcome: Not Progressing

## 2023-10-02 NOTE — Progress Notes (Signed)
Physical Therapy Treatment Patient Details Name: Benjamin Bolton MRN: [id number] DOB: 07/14/1961 Today's Date: 10/02/2023   History of Present Illness 63 y.o. 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    PT Comments  Pt was able/willing to do some exercises but after initial set up get to side and toward EOB but did not ultimately have the gumption to get up to sitting.  On arrival he c/o being cold and PT pulled up covers over shoulders and adjusted the thermostat.  He was limited but showed good effort with LE exercises in the bed (with covers on other 3/4 of body) but did need AAROM for some tasks and consistent cuing.  Pt will benefit from further PT to address functional limitations, continue with POC.    If plan is discharge home, recommend the following: A lot of help with walking and/or transfers;A lot of help with bathing/dressing/bathroom;Assist for transportation;Assistance with cooking/housework;Help with stairs or ramp for entrance   Can travel by private vehicle        Equipment Recommendations   (TBD)    Recommendations for Other Services       Precautions / Restrictions Precautions Precautions: Fall Restrictions Weight Bearing Restrictions Per Provider Order: No     Mobility  Bed Mobility Overal bed mobility: Needs Assistance Bed Mobility: Supine to Sit, Sit to Supine           General bed mobility comments: Pt made some initial moves toward EOB (b/l LEs toward edge, assisted reaching to handrail with partial turn to side) but he complained of being too cold to do anything more and could not be convinced to do further mobility despite much encouragement    Transfers                        Ambulation/Gait                    Stairs             Wheelchair Mobility     Tilt Bed    Modified Rankin (Stroke Patients Only)       Balance                                            Cognition Arousal: Alert Behavior During Therapy: WFL for tasks assessed/performed Overall Cognitive Status: Impaired/Different from baseline                                 General Comments: Pt continues to be sparse with vocalization, mostly focusing on being cold        Exercises General Exercises - Lower Extremity Ankle Circles/Pumps: PROM, AAROM, 10 reps (limited DF range and strength b/l, L worse than R) Quad Sets: Strengthening, 10 reps (inconsistent ability to follow cuing/flow to fully engage quads) Heel Slides: AAROM, AROM, 10 reps (pt able to initiate heel side AROM but needing help to get through fuller range, resisted leg ext) Hip ABduction/ADduction: AAROM, AROM, 10 reps (inconsistent A/AAROM ability) Straight Leg Raises: AAROM, 5 reps (unable to lift against gravity consistently w/o assist)  General Comments General comments (skin integrity, edema, etc.): Pt remains very weak, showed good effort with supine exericses but ultimately refused more than minimal mobility as he was "too cold" and could not be convinced to continue with mobility      Pertinent Vitals/Pain Pain Assessment Pain Assessment: No/denies pain    Home Living                          Prior Function            PT Goals (current goals can now be found in the care plan section) Progress towards PT goals: Progressing toward goals    Frequency    Min 1X/week      PT Plan      Co-evaluation              AM-PAC PT "6 Clicks" Mobility   Outcome Measure  Help needed turning from your back to your side while in a flat bed without using bedrails?: A Little Help needed moving from lying on your back to sitting on the side of a flat bed without using bedrails?: A  Lot Help needed moving to and from a bed to a chair (including a wheelchair)?: A Lot Help needed standing up from a chair using your arms (e.g., wheelchair or bedside chair)?: A Lot Help needed to walk in hospital room?: Total Help needed climbing 3-5 steps with a railing? : Total 6 Click Score: 11    End of Session   Activity Tolerance: Patient limited by fatigue Patient left: with call bell/phone within reach;in bed;with nursing/sitter in room Nurse Communication: Mobility status PT Visit Diagnosis: Unsteadiness on feet (R26.81);Muscle weakness (generalized) (M62.81);Difficulty in walking, not elsewhere classified (R26.2)     Time: 4098-1191 PT Time Calculation (min) (ACUTE ONLY): 25 min  Charges:    $Therapeutic Exercise: 8-22 mins $Therapeutic Activity: 8-22 mins PT General Charges $$ ACUTE PT VISIT: 1 Visit                     Malachi Pro, DPT 10/02/2023, 9:53 AM

## 2023-10-02 NOTE — Progress Notes (Signed)
Inpatient Rehab Admissions Coordinator:   I called and spoke to pt's son on the phone and he asked that I call pt's sister who is coordinating pt's care at this point.  I spoke to Stanton Kidney and explained CIR recommendations and expectations of a rehab stay to include short LOS, high intensity therapy, and physician oversight.  I also provided info on SNF level rehab with potential for longer LOS and lower intensity rehab.  We discussed caregiver support and I expressed that following a CIR admission we would likely expect Mr. Costlow to require 24/7 supervision at minimum.  Note HR and RR are elevated today and maintaining higher level of care.  I let her know that I would f/u with her on Monday.  We would need insurance approval from [facility] Medicare if they decided to pursue CIR>   Estill Dooms, PT, DPT Admissions Coordinator [phone] 10/02/23  4:24 PM

## 2023-10-02 NOTE — Progress Notes (Signed)
Notified Elvina Sidle, NP that patient is refusing to drink his oral potassium stating it tastes bad. Patient did not drink 0630 dose either. RN also asked NP to look at right arm because it is more swollen that left and limited mobility in RUE due to pain per patient. NP stated he would order IV potassium and ultrasound.

## 2023-10-02 NOTE — Progress Notes (Signed)
PHARMACY CONSULT NOTE - FOLLOW UP  Pharmacy Consult for Electrolyte Monitoring and Replacement   Recent Labs: Potassium (mmol/L)  Date Value  10/02/2023 3.3 (L)   Magnesium (mg/dL)  Date Value  40/98/1191 1.8   Calcium (mg/dL)  Date Value  47/82/9562 8.4 (L)   Albumin (g/dL)  Date Value  13/04/6577 2.0 (L)   Phosphorus (mg/dL)  Date Value  46/96/2952 2.1 (L)   Sodium (mmol/L)  Date Value  10/02/2023 141     Assessment: 63 y/o male with h/o COPD, DVT, HLD, GERD, pulmonary nodule and DM who is admitted with RSV, pneumonia and COPD exacerbation. Pharmacy is asked to follow and replace electrolytes while in CCU  Goal of Therapy:  Electrolytes WNL  Plan:  ---40 mEq po KCl x 2 per NP ---500 mg K-Phos neutral (contains phosphorus 16 mMol, potassium 2.2 mEq) ---recheck electrolyte sin am  Lowella Bandy ,PharmD Clinical Pharmacist 10/02/2023 7:13 AM

## 2023-10-02 NOTE — Progress Notes (Signed)
NAME:  Benjamin Bolton, MRN:  [id number], DOB:  [birth year], LOS: 22 ADMISSION DATE:  09/10/2023, CONSULTATION DATE: 09/13/2023  Brief Pt Description / Synopsis:  63 y.o. 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   09/29/23- Extubated to BIPAP, I met with family at bedside and reviewed medical plan as well as answered questions.  09/30/23- patient is more awake and able to speak few words asking for water.  10/01/23- patient on HFNC 40/40 able to communicate verbally, will perform SLP and PT/OT eval. Req precedex for aggitation weaning.  CRP trending down and steroids tapering in parallel. 10/02/23- patient weaned to 4L/min, he is eating.  For PT/OT today.  Patient being optimized for TRH transfer.  Weaning from precedex. Patient reports depression but expresses that he wishes to live and wants to continue working towards improvement.   Pertinent  Medical History  DVT on Eliquis COPD on 2L O2 Type II Diabetes Mellitus  GERD HLD Pulmonary Nodule Tobacco Abuse   Micro Data:  1/8: SARS-CoV-2 PCR>>negative 1/8: Blood culture x2>> no growth 1/8: Urine>> multiple species (suggest recollection) 1/9: RVP>> + RSV 1/9: Sputum>> normal respiratory flora 1/9: HIV Screen>> non reactive 1/11: MRSA PCR>> negative 1/19: MRSA PCR>> 1/19: RVP>>  Antimicrobials:   Anti-infectives (From admission, onward)    Start     Dose/Rate Route Frequency Ordered Stop   09/29/23 2000  azithromycin (ZITHROMAX) 250 mg in dextrose 5 % 125 mL IVPB        250 mg 127.5 mL/hr over 60 Minutes Intravenous Every 24 hours 09/29/23 1801     09/29/23 1245  azithromycin (ZITHROMAX) tablet 250 mg  Status:  Discontinued        250 mg Per Tube Daily 09/29/23 1150 09/29/23 1801   09/25/23 1200  linezolid (ZYVOX)  IVPB 600 mg  Status:  Discontinued        600 mg 300 mL/hr over 60 Minutes Intravenous Every 12 hours 09/25/23 0918 09/26/23 1206   09/25/23 1015  ceFEPIme (MAXIPIME) 2 g in sodium chloride 0.9 % 100 mL IVPB  Status:  Discontinued        2 g 200 mL/hr over 30 Minutes Intravenous Every 8 hours 09/25/23 0918 09/29/23 1053   09/25/23 0000  ceFAZolin (ANCEF) IVPB 2g/100 mL premix        2 g 200 mL/hr over 30 Minutes Intravenous  Once 09/23/23 1103 09/25/23 0013   09/22/23 0010  vancomycin (VANCOCIN) IVPB 750 mg/150 ml premix  Status:  Discontinued        750 mg 150 mL/hr over 60 Minutes Intravenous Every 12 hours 09/22/23 0004 09/22/23 1039   09/22/23 0000  Vancomycin (VANCOCIN) 750 mg IVPB  Status:  Discontinued        750 mg 150 mL/hr over 60 Minutes Intravenous Every 12 hours 09/21/23 1120 09/21/23 2356   09/22/23 0000  vancomycin (VANCOREADY) IVPB 750 mg/150 mL  Status:  Discontinued        750 mg 150 mL/hr over 60 Minutes Intravenous Every 12 hours 09/21/23 2356 09/22/23 0004   09/21/23 1300  vancomycin (VANCOCIN) IVPB 1000 mg/200 mL premix        1,000 mg 200 mL/hr over 60 Minutes Intravenous  Once 09/21/23 1120 09/21/23 1900   09/21/23 1215  piperacillin-tazobactam (ZOSYN) IVPB 3.375 g  Status:  Discontinued        3.375 g 12.5 mL/hr over 240 Minutes Intravenous Every 8 hours 09/21/23 1120 09/23/23 1257   09/21/23 1130  vancomycin (VANCOREADY) IVPB 1250 mg/250 mL  Status:  Discontinued        1,250 mg 166.7 mL/hr over 90 Minutes Intravenous  Once 09/21/23 1046 09/21/23 1120   09/21/23 1100  piperacillin-tazobactam (ZOSYN) IVPB 3.375 g  Status:  Discontinued        3.375 g 12.5 mL/hr over 240 Minutes Intravenous  Once 09/21/23 1046 09/21/23 1152   09/13/23 1445  piperacillin-tazobactam (ZOSYN) IVPB 3.375 g        3.375 g 12.5 mL/hr over 240 Minutes Intravenous Every 8 hours 09/13/23 1351 09/17/23 0157   09/10/23 2315  cefTRIAXone (ROCEPHIN) 1 g in sodium chloride 0.9 % 100 mL IVPB   Status:  Discontinued        1 g 200 mL/hr over 30 Minutes Intravenous Every 24 hours 09/10/23 2305 09/13/23 1336          REVIEW OF SYSTEMS   PATIENT IS UNABLE TO PROVIDE COMPLETE REVIEW OF SYSTEMS DUE TO SEVERE CRITICAL ILLNESS    PHYSICAL EXAMINATION:   GENERAL:critically ill appearing, +resp distress EYES: Pupils equal, round, reactive to light.  No scleral icterus.  MOUTH: Moist mucosal membrane. INTUBATED NECK: Supple.  PULMONARY: Lungs clear to auscultation, +rhonchi, CARDIOVASCULAR: S1 and S2.  Regular rate and rhythm GASTROINTESTINAL: Soft, nontender, -distended. Positive bowel sounds.  MUSCULOSKELETAL: edema.  NEUROLOGIC: obtunded, SKIN:normal, warm to touch, Capillary refill delayed  Pulses present bilaterally      Significant Hospital Events: Including procedures, antibiotic start and stop dates in addition to other pertinent events   1/08: admit to [facility] 1/11: rapid response, transfer to ICU, intubated 1/12: remains vented, sedated, does not follow commands.  Failed SBT  1/13: Overnight due to concerns of aspiration TF's held.  Will repeat SBT today 1/14: Overnight pt had possible seizure activity with rhythmic tremors noted in the mouth/lower face/neck during episode pt unresponsive to pain.  Received 2 mg of versed and 2g keppra bolus with resolution of symptoms.  CT Head negative.  Standing dose of keppra 1g bid.  Neurology consulted.    1/15: On minimal vent settings, unable to tolerate WUA due to increased WOB. Shifting measures given for Hyperkalemia of 5.5 with peaked T waves.  Abdomen distended, KUB without evidence of obstruction, give Lactulose.  Diurese with 40 mg IV Lasix x1. 1/16: Failed WUA, with increased WOB and hypoxic with O2 sats dropping to mid 70's.  Add Seroquel.  Palliative Care consulted to assist with GOC. 1/17: Precedex started for WUA.  Failed SBT due to increased WOB, tachypnea, and hypoxia.  Increase Seroquel to 50 mg BID. Transition  Heparin to Elqiuis 1/18: Pt remains mechanically intubated and has failed multiple SBT's.  Planning for family meeting today at bedside with palliative care and PCCM will perform SBT once family arrives at bedside.  Remains sedated with propofol and fentanyl gtts.  Family meeting held code status changed to DNR per family request.  Pts family trying to decide if they want to proceed with tracheostomy.  Required additional sedation and prn vecuronium overnight due to vent dyssynchrony  1/19: Pt febrile overnight with increased vasopressor requirements levophed gtt @12  mcg/min and febrile tmax 101 degrees F concerning for recurrent sepsis.  Broad spectrum abx started. Pt remains mechanically intubated and unable to successfully liberate from ventilator  1/20 remains on vent, severe rap failure 1/21  remains on vent prolonged exp phase air trapping  on vent 1/22 remains on vent 1/23 CT ABD- Colonic ileus pattern shows significant improvement since the prior radiograph with no further significant gaseous distension of the colon. 1/23 CT chest B/L Lower lobe pneumonia severe emphysema 1/23 CT head-possible cerebral edema 1/23 remains on vent failure to wean 1/24 remains intubated, severe hypoxia, shock 1/25 remains intubated, severe hypoxia, shock  Interim History / Subjective:   1.Acute on chronic hypoxemic respiratory failure          RSV pneumonia      - Extubated 09/29/23  2. Advanced COPD       Continue zithromax       S/p decadeon x 1 pre liberation , on on prednisone taper  3. PAF - dcd heparin gtt       Currentnly in SR  - on heprin sq   ENDO - ICU hypoglycemic\Hyperglycemia protocol -check FSBS per protocol     GI GI PROPHYLAXIS as indicated NUTRITIONAL STATUS DIET-->TF's as tolerated Constipation protocol as indicated CT ABD ILEUS IMPROVING   ELECTROLYTES -follow labs as needed -replace as needed -pharmacy consultation and following   RESTRICTIVE TRANSFUSION  PROTOCOL TRANSFUSION  IF HGB<7  or ACTIVE BLEEDING OR DX of ACUTE CORONARY SYNDROMES     CT ABD ILEUS IMPROVING      Best Practice (right click and "Reselect all SmartList Selections" daily)    Diet/type: TF's DVT prophylaxis heparin Pressure ulcer(s): N/A GI prophylaxis: Pepcid  Lines: RUE PICC Line and still needed  Foley:  External Catheter  Code Status: DNR  Objective   Blood pressure 108/79, pulse (!) 109, temperature 99.4 F (37.4 C), temperature source Axillary, resp. rate (!) 34, height 6' (1.829 m), weight 59.8 kg, SpO2 100%.    FiO2 (%):  [36 %] 36 %   Intake/Output Summary (Last 24 hours) at 10/02/2023 1035 Last data filed at 10/02/2023 0651 Gross per 24 hour  Intake 933.49 ml  Output 1450 ml  Net -516.51 ml   Filed Weights   09/30/23 0500 10/01/23 0500 10/02/23 0456  Weight: 62 kg 61.1 kg 59.8 kg       REVIEW OF SYSTEMS  PATIENT IS UNABLE TO PROVIDE COMPLETE REVIEW OF SYSTEMS DUE TO CRITICAL ILLNESS   PHYSICAL EXAMINATION:  GENERAL:critically ill appearing,  EYES: Pupils equal, round, reactive to light.  No scleral icterus.  MOUTH: Moist mucosal membrane.on HFNC NECK: Supple.  PULMONARY: Lungs clear to auscultation, +rhonchi, +wheezing CARDIOVASCULAR: S1 and S2.  Regular rate and rhythm GASTROINTESTINAL: Soft, nontender, -distended. Positive bowel sounds.  MUSCULOSKELETAL: edema.  NEUROLOGIC: obtunded, SKIN:normal, warm to touch, Capillary refill delayed  Pulses present bilaterally    Critical care provider statement:   Total critical care time: 31 minutes   Performed by: Karna Christmas MD   Critical care time was exclusive of separately billable procedures and treating other patients.   Critical care was necessary to treat or prevent imminent or life-threatening deterioration.   Critical care was time spent personally by me on the following activities: development of treatment plan with patient and/or surrogate as well as nursing, discussions  with consultants, evaluation of patient's response to treatment, examination of patient, obtaining history from patient or surrogate, ordering and performing treatments and interventions, ordering and review of laboratory studies, ordering and review of radiographic studies, pulse oximetry and re-evaluation of patient's condition.    Vida Rigger, M.D.  Pulmonary & Critical Care Medicine

## 2023-10-02 NOTE — Plan of Care (Signed)
Problem: Education: Goal: Knowledge of General Education information will improve Description: Including pain rating scale, medication(s)/side effects and non-pharmacologic comfort measures Outcome: Progressing   Problem: Health Behavior/Discharge Planning: Goal: Ability to manage health-related needs will improve Outcome: Progressing   Problem: Clinical Measurements: Goal: Ability to maintain clinical measurements within normal limits will improve Outcome: Progressing Goal: Will remain free from infection Outcome: Progressing Goal: Diagnostic test results will improve Outcome: Progressing Goal: Respiratory complications will improve Outcome: Progressing Goal: Cardiovascular complication will be avoided Outcome: Progressing   Problem: Activity: Goal: Risk for activity intolerance will decrease Outcome: Progressing   Problem: Nutrition: Goal: Adequate nutrition will be maintained Outcome: Progressing   Problem: Coping: Goal: Level of anxiety will decrease Outcome: Progressing   Problem: Elimination: Goal: Will not experience complications related to bowel motility Outcome: Progressing Goal: Will not experience complications related to urinary retention Outcome: Progressing   Problem: Pain Management: Goal: General experience of comfort will improve Outcome: Progressing   Problem: Safety: Goal: Ability to remain free from injury will improve Outcome: Progressing   Problem: Skin Integrity: Goal: Risk for impaired skin integrity will decrease Outcome: Progressing   Problem: Education: Goal: Knowledge of disease or condition will improve Outcome: Progressing Goal: Knowledge of the prescribed therapeutic regimen will improve Outcome: Progressing   Problem: Activity: Goal: Ability to tolerate increased activity will improve Outcome: Progressing Goal: Will verbalize the importance of balancing activity with adequate rest periods Outcome: Progressing   Problem:  Respiratory: Goal: Ability to maintain a clear airway will improve Outcome: Progressing Goal: Levels of oxygenation will improve Outcome: Progressing Goal: Ability to maintain adequate ventilation will improve Outcome: Progressing   Problem: Education: Goal: Ability to describe self-care measures that may prevent or decrease complications (Diabetes Survival Skills Education) will improve Outcome: Progressing   Problem: Coping: Goal: Ability to adjust to condition or change in health will improve Outcome: Progressing   Problem: Fluid Volume: Goal: Ability to maintain a balanced intake and output will improve Outcome: Progressing   Problem: Health Behavior/Discharge Planning: Goal: Ability to identify and utilize available resources and services will improve Outcome: Progressing Goal: Ability to manage health-related needs will improve Outcome: Progressing   Problem: Metabolic: Goal: Ability to maintain appropriate glucose levels will improve Outcome: Progressing   Problem: Nutritional: Goal: Maintenance of adequate nutrition will improve Outcome: Progressing Goal: Progress toward achieving an optimal weight will improve Outcome: Progressing   Problem: Skin Integrity: Goal: Risk for impaired skin integrity will decrease Outcome: Progressing   Problem: Tissue Perfusion: Goal: Adequacy of tissue perfusion will improve Outcome: Progressing   Problem: Activity: Goal: Ability to tolerate increased activity will improve Outcome: Progressing   Problem: Respiratory: Goal: Ability to maintain a clear airway and adequate ventilation will improve Outcome: Progressing   Problem: Role Relationship: Goal: Method of communication will improve Outcome: Progressing

## 2023-10-02 NOTE — Progress Notes (Signed)
Palliative Care Progress Note, Assessment & Plan   Patient Name: Benjamin Bolton       Date: 10/02/2023 DOB: 02-May-1961  Age: 63 y.o. MRN#: [id number] Attending Physician: Vida Rigger, MD Primary Care Physician: [facility] Admit Date: 09/10/2023  Subjective: Patient is lying in bed in no apparent distress with nasal cannula in place.  He is awake and alert.  He acknowledges my presence and is able to make his wishes known.  No family or friends present during my visit.  HPI: 63 y.o. 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    1/11, rapid response, transferred to ICU, patient was intubated and sedated..  1/12, remains vented, sedated, does not follow commands.  Failed SBT  1/13, overnight due to concerns of aspiration TF's held.  Will repeat SBT today 1/14, overnight pt had possible seizure activity with rhythmic tremors noted in the mouth/lower face/neck during episode pt unresponsive to pain.  Received 2 mg of versed and 2g keppra bolus with resolution of symptoms.  CT Head negative.  Standing dose of keppra 1g bid.  Neurology consulted.    1/15, on minimal vent settings, unable to tolerate WUA due to increased WOB. Shifting measures given for Hyperkalemia of 5.5 with peaked T waves.  Abdomen distended, KUB without evidence of obstruction, give Lactulose.  Diurese with 40 mg IV Lasix x1. 1/16, Failed WUA, with increased WOB and hypoxic with O2 sats dropping to mid 70's.  Add Seroquel.   1/17-1/19 failed WUA/SBT 1/19 concern for recurrent sepsis due to fevers developing overnight  and increased vasopressor support 09/29/23- Extubated to BIPAP, I met with family at bedside and reviewed medical plan as well as answered questions.  09/30/23- patient is more awake and able to speak few words asking for water.  10/01/23- patient on HFNC 40/40 able to communicate verbally, will perform SLP and PT/OT eval. Req precedex for aggitation weaning.  CRP trending down and steroids tapering in parallel.  Summary of counseling/coordination of care: Extensive chart review completed prior to meeting patient including labs, vital signs, imaging, progress notes, orders, and available advanced directive documents from current and previous encounters.   After reviewing the patient's chart, counseled with dayshift RN.  She endorses patient has been declining PT and refusing to take medications such as Eliquis and potassium.  After counseling with RN and assessing the patient at bedside, I spoke with patient in regards to symptom management and goals of care.  He is alert and speaks in a low whisper.  However, he is alert and oriented x 4, able to participate in goals of care and medical decision making independently.  Symptoms assessed.  Patient endorses he is not in any pain or discomfort at this time.  He denies headache, chest pain, N/V/D, and other acute issues at this time.  No adjustment to [facility] needed.  I attempted to elicit values and goals imported the patient.  He shares he does not want to go back on the machine.  He remains in agreement  with DNR with limited interventions.  I discussed course of hospitalization, including goals of care discussion with family and patient's previous poor prognosis when intubated.  He has shown signs of great recovery.  Medical team remains hopeful that patient can continue to show signs of improvement.  However, patient does have a chronic, irreversible, and progressive lung condition-COPD.    Patient shares he knows he was not well and he is glad he is  feeling better.  He shares his goal is to get better and stronger so that he can get back to his regular life.  I discussed reasons why patient has been refusing PT and medications.  He says he "is doing okay".  I discussed importance of excepting medical treatment to prevent steps backwards.  Discussed that patient has come far and shown signs of improvement but that if he does not comply with recommendations, such as Eliquis and potassium, then he is putting himself at great risk for deterioration and death.    Additionally, discussed importance of functional status/mobility as patient has been in the bed for 22 days.  Discussed that mobility is important so that patient does not live the rest of his life bedbound/weak.  He shares he "did not understand it like that".  He endorses he is willing to take his medications and to participate with PT.  Conveyed above to RN who is going to reattempt to give a.m. medications.  Additionally, I spoke with patient in regards to advanced care planning documentation.  He shares he is glad that he got the paperwork done.  Discussed that he is referencing the advance directives at the chaplains help complete.  He nodded his head yes.  I discussed patient's CODE STATUS.  He endorses that he is a DNR and a DNI.  Adjusted CODE STATUS to reflect this.  Symptom burden is low.  Goals are clear.  DNR with limited interventions remains.  Advanced directive is on file.  PMT will step back from daily visits.  Please reengage at patient/family's request, if goals change, or if patient's health deteriorates during hospitalization.  Physical Exam Vitals reviewed.  Constitutional:      General: He is not in acute distress.    Appearance: He is normal weight.  HENT:     Head: Normocephalic.  Cardiovascular:     Rate and Rhythm: Normal rate.  Pulmonary:     Effort: Pulmonary effort is normal.  Musculoskeletal:     Comments: MAETC, generalized weakness  Neurological:      Mental Status: He is alert and oriented to person, place, and time.  Psychiatric:        Mood and Affect: Mood normal. Mood is not anxious.        Behavior: Behavior normal. Behavior is not agitated.             Total Time 50 minutes   Time spent includes: Detailed review of medical records (labs, imaging, vital signs), medically appropriate exam (mental status, respiratory, cardiac, skin), discussed with treatment team, counseling and educating patient, family and staff, documenting clinical information, medication management and coordination of care.  Samara Deist L. Bonita Quin, DNP, FNP-BC Palliative Medicine Team

## 2023-10-02 NOTE — Progress Notes (Signed)
Spoke with Elvina Sidle, NP and made him aware that patient's heart rate has been elevated 110-120 Sinus tach and RR 30-40. NP acknowledged and stated he would change his level of care to step down.

## 2023-10-02 NOTE — Progress Notes (Signed)
Patient is refusing all medications ad glucose checks. Patient educated on medication and CBG refusal. Patient expresses he wants to go home but is not oriented to date, situation, or time. Patient is only oriented to self.

## 2023-10-03 DIAGNOSIS — J441 Chronic obstructive pulmonary disease with (acute) exacerbation: Secondary | ICD-10-CM | POA: Diagnosis not present

## 2023-10-03 LAB — CBC
HCT: 31.8 % — ABNORMAL LOW (ref 39.0–52.0)
Hemoglobin: 10.1 g/dL — ABNORMAL LOW (ref 13.0–17.0)
MCH: 30.5 pg (ref 26.0–34.0)
MCHC: 31.8 g/dL (ref 30.0–36.0)
MCV: 96.1 fL (ref 80.0–100.0)
Platelets: 406 10*3/uL — ABNORMAL HIGH (ref 150–400)
RBC: 3.31 MIL/uL — ABNORMAL LOW (ref 4.22–5.81)
RDW: 15 % (ref 11.5–15.5)
WBC: 12.4 10*3/uL — ABNORMAL HIGH (ref 4.0–10.5)
nRBC: 0 % (ref 0.0–0.2)

## 2023-10-03 LAB — BASIC METABOLIC PANEL
Anion gap: 13 (ref 5–15)
BUN: 14 mg/dL (ref 8–23)
CO2: 26 mmol/L (ref 22–32)
Calcium: 8.6 mg/dL — ABNORMAL LOW (ref 8.9–10.3)
Chloride: 102 mmol/L (ref 98–111)
Creatinine, Ser: 0.43 mg/dL — ABNORMAL LOW (ref 0.61–1.24)
GFR, Estimated: >60 mL/min (ref >60)
Glucose, Bld: 98 mg/dL (ref 70–99)
Potassium: 3.6 mmol/L (ref 3.5–5.1)
Sodium: 141 mmol/L (ref 135–145)

## 2023-10-03 LAB — GLUCOSE, CAPILLARY
Glucose-Capillary: 99 mg/dL (ref 70–99)
Glucose-Capillary: 86 mg/dL (ref 70–99)
Glucose-Capillary: 109 mg/dL — ABNORMAL HIGH (ref 70–99)
Glucose-Capillary: 147 mg/dL — ABNORMAL HIGH (ref 70–99)

## 2023-10-03 LAB — C-REACTIVE PROTEIN: CRP: 11.8 mg/dL — ABNORMAL HIGH (ref <1.0)

## 2023-10-03 LAB — MAGNESIUM: Magnesium: 1.8 mg/dL (ref 1.7–2.4)

## 2023-10-03 LAB — PHOSPHORUS: Phosphorus: 2.6 mg/dL (ref 2.5–4.6)

## 2023-10-03 MED ORDER — INSULIN ASPART 100 UNIT/ML IJ SOLN
0.0000 [IU] | INTRAMUSCULAR | Status: DC
  Administered 2023-10-05 – 2023-10-07 (×6): 1 [IU] via SUBCUTANEOUS
  Administered 2023-10-07: 3 [IU] via SUBCUTANEOUS
  Administered 2023-10-07: 2 [IU] via SUBCUTANEOUS
  Administered 2023-10-07: 1 [IU] via SUBCUTANEOUS
  Administered 2023-10-07: 2 [IU] via SUBCUTANEOUS
  Administered 2023-10-07 – 2023-10-08 (×2): 3 [IU] via SUBCUTANEOUS
  Administered 2023-10-08 (×3): 1 [IU] via SUBCUTANEOUS
  Administered 2023-10-08: 3 [IU] via SUBCUTANEOUS
  Administered 2023-10-09: 2 [IU] via SUBCUTANEOUS
  Administered 2023-10-09: 3 [IU] via SUBCUTANEOUS
  Administered 2023-10-09 (×2): 1 [IU] via SUBCUTANEOUS
  Administered 2023-10-09 – 2023-10-10 (×3): 2 [IU] via SUBCUTANEOUS
  Administered 2023-10-10: 3 [IU] via SUBCUTANEOUS
  Administered 2023-10-10: 2 [IU] via SUBCUTANEOUS
  Administered 2023-10-10: 3 [IU] via SUBCUTANEOUS
  Administered 2023-10-11: 5 [IU] via SUBCUTANEOUS
  Administered 2023-10-11: 2 [IU] via SUBCUTANEOUS
  Administered 2023-10-11: 3 [IU] via SUBCUTANEOUS
  Administered 2023-10-11 (×3): 1 [IU] via SUBCUTANEOUS
  Administered 2023-10-12: 3 [IU] via SUBCUTANEOUS
  Administered 2023-10-12 (×4): 2 [IU] via SUBCUTANEOUS
  Administered 2023-10-12: 3 [IU] via SUBCUTANEOUS
  Administered 2023-10-13 (×2): 2 [IU] via SUBCUTANEOUS
  Administered 2023-10-13: 1 [IU] via SUBCUTANEOUS
  Administered 2023-10-13 (×3): 2 [IU] via SUBCUTANEOUS
  Administered 2023-10-13: 1 [IU] via SUBCUTANEOUS
  Administered 2023-10-14 (×4): 2 [IU] via SUBCUTANEOUS
  Administered 2023-10-14: 3 [IU] via SUBCUTANEOUS
  Administered 2023-10-15: 1 [IU] via SUBCUTANEOUS
  Administered 2023-10-15: 3 [IU] via SUBCUTANEOUS
  Administered 2023-10-15 (×2): 2 [IU] via SUBCUTANEOUS
  Administered 2023-10-15 – 2023-10-16 (×3): 1 [IU] via SUBCUTANEOUS
  Administered 2023-10-16: 2 [IU] via SUBCUTANEOUS
  Administered 2023-10-16: 3 [IU] via SUBCUTANEOUS
  Administered 2023-10-16 – 2023-10-17 (×3): 2 [IU] via SUBCUTANEOUS
  Filled 2023-10-03 (×62): qty 1

## 2023-10-03 NOTE — Plan of Care (Signed)
   Problem: Education: Goal: Knowledge of General Education information will improve Description: Including pain rating scale, medication(s)/side effects and non-pharmacologic comfort measures Outcome: Not Progressing   Problem: Health Behavior/Discharge Planning: Goal: Ability to manage health-related needs will improve Outcome: Not Progressing   Problem: Clinical Measurements: Goal: Ability to maintain clinical measurements within normal limits will improve Outcome: Not Progressing Goal: Will remain free from infection Outcome: Not Progressing Goal: Diagnostic test results will improve Outcome: Not Progressing Goal: Respiratory complications will improve Outcome: Not Progressing Goal: Cardiovascular complication will be avoided Outcome: Not Progressing   Problem: Activity: Goal: Risk for activity intolerance will decrease Outcome: Not Progressing   Problem: Nutrition: Goal: Adequate nutrition will be maintained Outcome: Not Progressing   Problem: Coping: Goal: Level of anxiety will decrease Outcome: Not Progressing   Problem: Elimination: Goal: Will not experience complications related to bowel motility Outcome: Not Progressing Goal: Will not experience complications related to urinary retention Outcome: Not Progressing   Problem: Pain Management: Goal: General experience of comfort will improve Outcome: Not Progressing   Problem: Safety: Goal: Ability to remain free from injury will improve Outcome: Not Progressing   Problem: Skin Integrity: Goal: Risk for impaired skin integrity will decrease Outcome: Not Progressing   Problem: Education: Goal: Knowledge of disease or condition will improve Outcome: Not Progressing Goal: Knowledge of the prescribed therapeutic regimen will improve Outcome: Not Progressing   Problem: Activity: Goal: Ability to tolerate increased activity will improve Outcome: Not Progressing Goal: Will verbalize the importance of  balancing activity with adequate rest periods Outcome: Not Progressing   Problem: Respiratory: Goal: Ability to maintain a clear airway will improve Outcome: Not Progressing Goal: Levels of oxygenation will improve Outcome: Not Progressing Goal: Ability to maintain adequate ventilation will improve Outcome: Not Progressing   Problem: Education: Goal: Ability to describe self-care measures that may prevent or decrease complications (Diabetes Survival Skills Education) will improve Outcome: Not Progressing   Problem: Coping: Goal: Ability to adjust to condition or change in health will improve Outcome: Not Progressing   Problem: Fluid Volume: Goal: Ability to maintain a balanced intake and output will improve Outcome: Not Progressing   Problem: Health Behavior/Discharge Planning: Goal: Ability to identify and utilize available resources and services will improve Outcome: Not Progressing Goal: Ability to manage health-related needs will improve Outcome: Not Progressing   Problem: Metabolic: Goal: Ability to maintain appropriate glucose levels will improve Outcome: Not Progressing   Problem: Nutritional: Goal: Maintenance of adequate nutrition will improve Outcome: Not Progressing Goal: Progress toward achieving an optimal weight will improve Outcome: Not Progressing   Problem: Skin Integrity: Goal: Risk for impaired skin integrity will decrease Outcome: Not Progressing   Problem: Tissue Perfusion: Goal: Adequacy of tissue perfusion will improve Outcome: Not Progressing   Problem: Activity: Goal: Ability to tolerate increased activity will improve Outcome: Not Progressing   Problem: Respiratory: Goal: Ability to maintain a clear airway and adequate ventilation will improve Outcome: Not Progressing   Problem: Role Relationship: Goal: Method of communication will improve Outcome: Not Progressing

## 2023-10-03 NOTE — Progress Notes (Signed)
Physical Therapy Treatment Patient Details Name: Benjamin Bolton MRN: [id number] DOB: [birth year] Today's Date: 10/03/2023   History of Present Illness 63 y.o. 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    PT Comments  Patient is agreeable to PT session. He is confused and wants to "get to the car so I can drive to [facility]." He is cooperative and can be redirected. The patient continues to require assistance for bed mobility. He was able to stand x 3 bouts with Max A. Cues for technique. Standing tolerance of no more than 5 seconds with cues for upright standing posture. Activity tolerance is limited by fatigue. Recommend to continue PT to maximize independence and facilitate return to prior level of function. Consider rehabilitation > 3 hours/day after this hospital stay.    If plan is discharge home, recommend the following: A lot of help with walking and/or transfers;A lot of help with bathing/dressing/bathroom;Assist for transportation;Assistance with cooking/housework;Help with stairs or ramp for entrance   Can travel by private vehicle        Equipment Recommendations   (to be determined at next level of care)    Recommendations for Other Services Rehab consult     Precautions / Restrictions Precautions Precautions: Fall Restrictions Weight Bearing Restrictions Per Provider Order: No     Mobility  Bed Mobility Overal bed mobility: Needs Assistance Bed Mobility: Supine to Sit, Sit to Supine     Supine to sit: Mod assist Sit to supine: Max assist   General bed mobility comments: assistance for trunk and BLE support. cues for task initiaion and sequencing    Transfers Overall transfer level: Needs assistance Equipment used: Rolling walker (2  wheels) Transfers: Sit to/from Stand Sit to Stand: Max assist           General transfer comment: 3 bouts of standing performed from bed with one person assistance. cues for anterior weight shifting and technique to facilitate independence    Ambulation/Gait             Pre-gait activities: cues for upright standing balance as patient with flexed posture. standing tolerance limited to ~ 5 seconds     Stairs             Wheelchair Mobility     Tilt Bed    Modified Rankin (Stroke Patients Only)       Balance Overall balance assessment: Needs assistance Sitting-balance support: Bilateral upper extremity supported, Feet supported Sitting balance-Leahy Scale: Poor Sitting balance - Comments: intermittent posterior lean that improves with increased sitting time.   Standing balance support: Bilateral upper extremity supported Standing balance-Leahy Scale: Zero Standing balance comment: significant external support required to maintain standing balance                            Cognition Arousal: Alert Behavior During Therapy: WFL for tasks assessed/performed, Flat affect Overall Cognitive Status: Impaired/Different from baseline Area of Impairment: Safety/judgement, Following commands, Orientation                 Orientation Level: Disoriented to, Place     Following Commands: Follows one step commands with increased time Safety/Judgement: Decreased awareness of safety, Decreased awareness of deficits  Exercises      General Comments General comments (skin integrity, edema, etc.): heart rate up to 138bpm with activity with increased respiration rate.      Pertinent Vitals/Pain Pain Assessment Pain Assessment: No/denies pain    Home Living                          Prior Function            PT Goals (current goals can now be found in the care plan section) Acute Rehab PT Goals Patient Stated Goal:  get stronger PT Goal Formulation: With patient Time For Goal Achievement: 10/14/23 Potential to Achieve Goals: Fair Progress towards PT goals: Progressing toward goals    Frequency    Min 1X/week      PT Plan      Co-evaluation              AM-PAC PT "6 Clicks" Mobility   Outcome Measure  Help needed turning from your back to your side while in a flat bed without using bedrails?: A Little Help needed moving from lying on your back to sitting on the side of a flat bed without using bedrails?: A Lot Help needed moving to and from a bed to a chair (including a wheelchair)?: A Lot Help needed standing up from a chair using your arms (e.g., wheelchair or bedside chair)?: A Lot Help needed to walk in hospital room?: Total Help needed climbing 3-5 steps with a railing? : Total 6 Click Score: 11    End of Session   Activity Tolerance: Patient limited by fatigue Patient left: in bed;with call bell/phone within reach;with bed alarm set Nurse Communication: Mobility status PT Visit Diagnosis: Unsteadiness on feet (R26.81);Muscle weakness (generalized) (M62.81);Difficulty in walking, not elsewhere classified (R26.2)     Time: 1610-9604 PT Time Calculation (min) (ACUTE ONLY): 23 min  Charges:    $Therapeutic Activity: 23-37 mins PT General Charges $$ ACUTE PT VISIT: 1 Visit                    Donna Bernard, PT, MPT    Ina Homes 10/03/2023, 12:44 PM

## 2023-10-03 NOTE — Progress Notes (Signed)
NAME:  Benjamin Bolton, MRN:  [id number], DOB:  05-Feb-1961, LOS: 23 ADMISSION DATE:  09/10/2023, CONSULTATION DATE: 09/13/2023  Brief Pt Description / Synopsis:  63 y.o. 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   09/29/23- Extubated to BIPAP, I met with family at bedside and reviewed medical plan as well as answered questions.  09/30/23- patient is more awake and able to speak few words asking for water.  10/01/23- patient on HFNC 40/40 able to communicate verbally, will perform SLP and PT/OT eval. Req precedex for aggitation weaning.  CRP trending down and steroids tapering in parallel. 10/02/23- patient weaned to 4L/min, he is eating.  For PT/OT today.  Patient being optimized for TRH transfer.  Weaning from precedex. Patient reports depression but expresses that he wishes to live and wants to continue working towards improvement.  10/03/23- patient was aggitated today and was curing at staff.  He did bedside swallow and passed.  He is on prednisone with mild leukocytosis. Today he is being optimized for transfer to medical floor.    Pertinent  Medical History  DVT on Eliquis COPD on 2L O2 Type II Diabetes Mellitus  GERD HLD Pulmonary Nodule Tobacco Abuse   Micro Data:  1/8: SARS-CoV-2 PCR>>negative 1/8: Blood culture x2>> no growth 1/8: Urine>> multiple species (suggest recollection) 1/9: RVP>> + RSV 1/9: Sputum>> normal respiratory flora 1/9: HIV Screen>> non reactive 1/11: MRSA PCR>> negative 1/19: MRSA PCR>> 1/19: RVP>>  Antimicrobials:   Anti-infectives (From admission, onward)    Start     Dose/Rate Route Frequency Ordered Stop   09/29/23 2000  azithromycin (ZITHROMAX) 250 mg in dextrose 5 % 125 mL IVPB        250 mg 127.5 mL/hr over 60 Minutes Intravenous  Every 24 hours 09/29/23 1801     09/29/23 1245  azithromycin (ZITHROMAX) tablet 250 mg  Status:  Discontinued        250 mg Per Tube Daily 09/29/23 1150 09/29/23 1801   09/25/23 1200  linezolid (ZYVOX) IVPB 600 mg  Status:  Discontinued        600 mg 300 mL/hr over 60 Minutes Intravenous Every 12 hours 09/25/23 0918 09/26/23 1206   09/25/23 1015  ceFEPIme (MAXIPIME) 2 g in sodium chloride 0.9 % 100 mL IVPB  Status:  Discontinued        2 g 200 mL/hr over 30 Minutes Intravenous Every 8 hours 09/25/23 0918 09/29/23 1053   09/25/23 0000  ceFAZolin (ANCEF) IVPB 2g/100 mL premix        2 g 200 mL/hr over 30 Minutes Intravenous  Once 09/23/23 1103 09/25/23 0013   09/22/23 0010  vancomycin (VANCOCIN) IVPB 750 mg/150 ml premix  Status:  Discontinued        750 mg 150 mL/hr over 60 Minutes Intravenous Every 12 hours 09/22/23 0004 09/22/23 1039   09/22/23 0000  Vancomycin (VANCOCIN) 750 mg IVPB  Status:  Discontinued        750 mg 150 mL/hr over 60 Minutes Intravenous Every 12 hours 09/21/23 1120 09/21/23 2356   09/22/23 0000  vancomycin (VANCOREADY) IVPB 750 mg/150 mL  Status:  Discontinued        750 mg 150 mL/hr over 60 Minutes Intravenous Every 12 hours 09/21/23 2356 09/22/23 0004   09/21/23 1300  vancomycin (VANCOCIN) IVPB 1000  mg/200 mL premix        1,000 mg 200 mL/hr over 60 Minutes Intravenous  Once 09/21/23 1120 09/21/23 1900   09/21/23 1215  piperacillin-tazobactam (ZOSYN) IVPB 3.375 g  Status:  Discontinued        3.375 g 12.5 mL/hr over 240 Minutes Intravenous Every 8 hours 09/21/23 1120 09/23/23 1257   09/21/23 1130  vancomycin (VANCOREADY) IVPB 1250 mg/250 mL  Status:  Discontinued        1,250 mg 166.7 mL/hr over 90 Minutes Intravenous  Once 09/21/23 1046 09/21/23 1120   09/21/23 1100  piperacillin-tazobactam (ZOSYN) IVPB 3.375 g  Status:  Discontinued        3.375 g 12.5 mL/hr over 240 Minutes Intravenous  Once 09/21/23 1046 09/21/23 1152   09/13/23 1445   piperacillin-tazobactam (ZOSYN) IVPB 3.375 g        3.375 g 12.5 mL/hr over 240 Minutes Intravenous Every 8 hours 09/13/23 1351 09/17/23 0157   09/10/23 2315  cefTRIAXone (ROCEPHIN) 1 g in sodium chloride 0.9 % 100 mL IVPB  Status:  Discontinued        1 g 200 mL/hr over 30 Minutes Intravenous Every 24 hours 09/10/23 2305 09/13/23 1336          REVIEW OF SYSTEMS   PATIENT IS UNABLE TO PROVIDE COMPLETE REVIEW OF SYSTEMS DUE TO SEVERE CRITICAL ILLNESS    PHYSICAL EXAMINATION:   GENERAL:critically ill appearing, +resp distress EYES: Pupils equal, round, reactive to light.  No scleral icterus.  MOUTH: Moist mucosal membrane. INTUBATED NECK: Supple.  PULMONARY: Lungs clear to auscultation, +rhonchi, CARDIOVASCULAR: S1 and S2.  Regular rate and rhythm GASTROINTESTINAL: Soft, nontender, -distended. Positive bowel sounds.  MUSCULOSKELETAL: edema.  NEUROLOGIC: obtunded, SKIN:normal, warm to touch, Capillary refill delayed  Pulses present bilaterally      Significant Hospital Events: Including procedures, antibiotic start and stop dates in addition to other pertinent events   1/08: admit to [facility] 1/11: rapid response, transfer to ICU, intubated 1/12: remains vented, sedated, does not follow commands.  Failed SBT  1/13: Overnight due to concerns of aspiration TF's held.  Will repeat SBT today 1/14: Overnight pt had possible seizure activity with rhythmic tremors noted in the mouth/lower face/neck during episode pt unresponsive to pain.  Received 2 mg of versed and 2g keppra bolus with resolution of symptoms.  CT Head negative.  Standing dose of keppra 1g bid.  Neurology consulted.    1/15: On minimal vent settings, unable to tolerate WUA due to increased WOB. Shifting measures given for Hyperkalemia of 5.5 with peaked T waves.  Abdomen distended, KUB without evidence of obstruction, give Lactulose.  Diurese with 40 mg IV Lasix x1. 1/16: Failed WUA, with increased WOB and hypoxic with O2  sats dropping to mid 70's.  Add Seroquel.  Palliative Care consulted to assist with GOC. 1/17: Precedex started for WUA.  Failed SBT due to increased WOB, tachypnea, and hypoxia.  Increase Seroquel to 50 mg BID. Transition Heparin to Elqiuis 1/18: Pt remains mechanically intubated and has failed multiple SBT's.  Planning for family meeting today at bedside with palliative care and PCCM will perform SBT once family arrives at bedside.  Remains sedated with propofol and fentanyl gtts.  Family meeting held code status changed to DNR per family request.  Pts family trying to decide if they want to proceed with tracheostomy.  Required additional sedation and prn vecuronium overnight due to vent dyssynchrony  1/19: Pt febrile overnight with increased vasopressor requirements levophed gtt @  12 mcg/min and febrile tmax 101 degrees F concerning for recurrent sepsis.  Broad spectrum abx started. Pt remains mechanically intubated and unable to successfully liberate from ventilator  1/20 remains on vent, severe rap failure 1/21 remains on vent prolonged exp phase air trapping  on vent 1/22 remains on vent 1/23 CT ABD- Colonic ileus pattern shows significant improvement since the prior radiograph with no further significant gaseous distension of the colon. 1/23 CT chest B/L Lower lobe pneumonia severe emphysema 1/23 CT head-possible cerebral edema 1/23 remains on vent failure to wean 1/24 remains intubated, severe hypoxia, shock 1/25 remains intubated, severe hypoxia, shock  Interim History / Subjective:   1.Acute on chronic hypoxemic respiratory failure          RSV pneumonia      - Extubated 09/29/23  2. Advanced COPD       Continue zithromax       S/p decadeon x 1 pre liberation , on on prednisone taper  3. PAF - dcd heparin gtt       Currentnly in SR  - on heprin sq   ENDO - ICU hypoglycemic\Hyperglycemia protocol -check FSBS per protocol     GI GI PROPHYLAXIS as indicated NUTRITIONAL  STATUS DIET-->TF's as tolerated Constipation protocol as indicated CT ABD ILEUS IMPROVING   ELECTROLYTES -follow labs as needed -replace as needed -pharmacy consultation and following   RESTRICTIVE TRANSFUSION PROTOCOL TRANSFUSION  IF HGB<7  or ACTIVE BLEEDING OR DX of ACUTE CORONARY SYNDROMES     CT ABD ILEUS IMPROVING      Best Practice (right click and "Reselect all SmartList Selections" daily)    Diet/type: TF's DVT prophylaxis heparin Pressure ulcer(s): N/A GI prophylaxis: Pepcid  Lines: RUE PICC Line and still needed  Foley:  External Catheter  Code Status: DNR  Objective   Blood pressure (!) 146/95, pulse (!) 122, temperature 99.3 F (37.4 C), temperature source Oral, resp. rate (!) 41, height 6' (1.829 m), weight 59.2 kg, SpO2 100%.        Intake/Output Summary (Last 24 hours) at 10/03/2023 1134 Last data filed at 10/03/2023 0500 Gross per 24 hour  Intake 99.97 ml  Output 1100 ml  Net -1000.03 ml   Filed Weights   10/01/23 0500 10/02/23 0456 10/03/23 0500  Weight: 61.1 kg 59.8 kg 59.2 kg       REVIEW OF SYSTEMS  PATIENT IS UNABLE TO PROVIDE COMPLETE REVIEW OF SYSTEMS DUE TO CRITICAL ILLNESS   PHYSICAL EXAMINATION:  GENERAL:critically ill appearing,  EYES: Pupils equal, round, reactive to light.  No scleral icterus.  MOUTH: Moist mucosal membrane.on HFNC NECK: Supple.  PULMONARY: Lungs clear to auscultation, +rhonchi, +wheezing CARDIOVASCULAR: S1 and S2.  Regular rate and rhythm GASTROINTESTINAL: Soft, nontender, -distended. Positive bowel sounds.  MUSCULOSKELETAL: edema.  NEUROLOGIC: obtunded, SKIN:normal, warm to touch, Capillary refill delayed  Pulses present bilaterally     Vida Rigger, M.D.  Pulmonary & Critical Care Medicine

## 2023-10-03 NOTE — Consult Note (Signed)
Physical Medicine and Rehabilitation Consult Reason for Consult:Debility 2/2 COPD exacerbation Referring Physician: Vida Rigger, DO   HPI: Benjamin Bolton is a 64 y.o. 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 Physical Medicine & Rehabilitation was consulted to assess candidacy for CIR.    ROS +SOB Past Medical History:  Diagnosis Date   COPD (chronic obstructive pulmonary disease) (HCC)    DVT (deep venous thrombosis) (HCC)    Dyspnea    GERD (gastroesophageal reflux disease)    no meds   HLD (hyperlipidemia)    Hx of migraines    Oxygen dependent    3 L [place]   Pneumonia 2023   Pre-diabetes    Pulmonary nodule    Rotator cuff tear, right    Tobacco abuse    Tobacco use    Past Surgical History:  Procedure Laterality Date   CHEST TUBE INSERTION     SHOULDER ARTHROSCOPY WITH SUBACROMIAL DECOMPRESSION, ROTATOR CUFF REPAIR AND BICEP TENDON REPAIR Right 02/18/2023   Procedure: RIGHT SHOULDER ARTHROSCOPY WITH DEBRIDEMENT, DECOMPRESSION, ROTATOR CUFF REPAIR, BICEPS TENODESIS.;  Surgeon: Christena Flake, MD;  Location: [facility] ORS;  Service: Orthopedics;  Laterality: Right;   History reviewed. No pertinent family history. Social History:  reports that he has been smoking cigarettes. He has a 12 pack-year smoking history. He has never used smokeless tobacco. He reports current alcohol use. He reports that he does not use drugs. Allergies: No Active Allergies Medications Prior to Admission  Medication Sig Dispense Refill   albuterol (PROVENTIL) (2.5 MG/3ML) 0.083% nebulizer solution USE 1 VIAL IN NEBULIZER EVERY 6 HOURS AS NEEDED FOR WHEEZING     albuterol (VENTOLIN HFA) 108 (90 Base) MCG/ACT inhaler Inhale 1 puff into the lungs every 4 (four) hours as needed  for wheezing or shortness of breath.     APIXABAN (ELIQUIS) VTE STARTER PACK (10MG  AND 5MG ) Take as directed on package: start with two-5mg  tablets twice daily for 7 days. On day 8, switch to one-5mg  tablet twice daily. 74 each 0   ondansetron (ZOFRAN) 4 MG tablet Take 1 tablet (4 mg total) by mouth every 6 (six) hours as needed for nausea. 30 tablet 0   oxyCODONE (OXY IR/ROXICODONE) 5 MG immediate release tablet Take 1 tablet by mouth 2 (two) times daily as needed.     OXYGEN Inhale 3 L into the lungs continuous.     predniSONE (DELTASONE) 10 MG tablet Take 2-6 tablets by mouth daily with breakfast.  ORIGINAL ZOX:WRUE 6 tabs daily x4 days; then 4 tabs daily x4 days; then 2 tabs daily x4 days.     predniSONE (DELTASONE) 20 MG tablet Take 20 mg by mouth daily with breakfast.     TRELEGY ELLIPTA 100-62.5-25 MCG/ACT AEPB Inhale 1 puff into the lungs every morning.     amoxicillin-clavulanate (AUGMENTIN) 875-125 MG tablet Take 1 tablet by mouth every 12 (twelve) hours. (Patient not taking: Reported on 09/10/2023)     benralizumab (FASENRA PEN) 30 MG/ML prefilled autoinjector Inject 30 mg into the skin as directed. (Patient not taking: Reported on 09/10/2023)     nystatin (MYCOSTATIN) 100000 UNIT/ML suspension Take 5 mLs by mouth 4 (four) times daily. (Patient not taking: Reported on 09/10/2023)     omeprazole (PRILOSEC) 20 MG capsule Take 20 mg  by mouth daily. (Patient not taking: Reported on 09/10/2023)     oxyCODONE (OXY IR/ROXICODONE) 5 MG immediate release tablet Take 1-2 tablets (5-10 mg total) by mouth every 4 (four) hours as needed for severe pain. 40 tablet 0    Home: Home Living Family/patient expects to be discharged to:: Private residence Living Arrangements: Other relatives Available Help at Discharge: Family Type of Home: House Home Access: Stairs to enter Secretary/administrator of Steps: 4 Entrance Stairs-Rails: None Home Equipment: BSC/3in1 Additional Comments: pt reports in last 6 months  moved in with his sister  Functional History: Prior Function Prior Level of Function : Independent/Modified Independent Mobility Comments: limited household mobility ADLs Comments: sister drives to [employer], use of electric shopping cart Functional Status:  Mobility: Bed Mobility Overal bed mobility: Needs Assistance Bed Mobility: Supine to Sit, Sit to Supine Supine to sit: Mod assist Sit to supine: Max assist General bed mobility comments: assistance for trunk and BLE support. cues for task initiaion and sequencing Transfers Overall transfer level: Needs assistance Equipment used: Rolling walker (2 wheels) Transfers: Sit to/from Stand Sit to Stand: Max assist General transfer comment: 3 bouts of standing performed from bed with one person assistance. cues for anterior weight shifting and technique to facilitate independence Ambulation/Gait Gait velocity: unable to attempt this date Pre-gait activities: cues for upright standing balance as patient with flexed posture. standing tolerance limited to ~ 5 seconds    ADL: ADL Overall ADL's : Needs assistance/impaired General ADL Comments: MOD A self-feeding at bed level, MAX A oral care in sitting as pt requires BUE support for static balance. MOD A x2 + RW for simulated BSC t/f  Cognition: Cognition Overall Cognitive Status: Impaired/Different from baseline Orientation Level: Oriented to person Cognition Arousal: Alert Behavior During Therapy: WFL for tasks assessed/performed, Flat affect Overall Cognitive Status: Impaired/Different from baseline Area of Impairment: Safety/judgement, Following commands, Orientation Orientation Level: Disoriented to, Place Following Commands: Follows one step commands with increased time Safety/Judgement: Decreased awareness of safety, Decreased awareness of deficits General Comments: Pt continues to be sparse with vocalization, mostly focusing on being cold  Blood pressure 138/86, pulse  (!) 119, temperature 99.3 F (37.4 C), temperature source Oral, resp. rate (!) 45, height 6' (1.829 m), weight 59.2 kg, SpO2 100%. Physical Exam Gen: no distress, normal appearing HEENT: oral mucosa pink and moist, NCAT Cardio: Reg rate Chest: tachycardia Abd: soft, non-distended Ext: no edema Psych: pleasant, normal affect Skin: intact Neuro: Alert Musculoskeletal: no focal deficits  Results for orders placed or performed during the hospital encounter of 09/10/23 (from the past 24 hours)  Glucose, capillary     Status: Abnormal   Collection Time: 10/02/23  4:01 PM  Result Value Ref Range   Glucose-Capillary 125 (H) 70 - 99 mg/dL  Glucose, capillary     Status: None   Collection Time: 10/03/23  5:02 AM  Result Value Ref Range   Glucose-Capillary 99 70 - 99 mg/dL  C-reactive protein     Status: Abnormal   Collection Time: 10/03/23  5:03 AM  Result Value Ref Range   CRP 11.8 (H) <1.0 mg/dL  Phosphorus     Status: None   Collection Time: 10/03/23  5:03 AM  Result Value Ref Range   Phosphorus 2.6 2.5 - 4.6 mg/dL  Magnesium     Status: None   Collection Time: 10/03/23  5:03 AM  Result Value Ref Range   Magnesium 1.8 1.7 - 2.4 mg/dL  Basic metabolic panel  Status: Abnormal   Collection Time: 10/03/23  5:03 AM  Result Value Ref Range   Sodium 141 135 - 145 mmol/L   Potassium 3.6 3.5 - 5.1 mmol/L   Chloride 102 98 - 111 mmol/L   CO2 26 22 - 32 mmol/L   Glucose, Bld 98 70 - 99 mg/dL   BUN 14 8 - 23 mg/dL   Creatinine, Ser 8.29 (L) 0.61 - 1.24 mg/dL   Calcium 8.6 (L) 8.9 - 10.3 mg/dL   GFR, Estimated >56 >21 mL/min   Anion gap 13 5 - 15  CBC     Status: Abnormal   Collection Time: 10/03/23  5:03 AM  Result Value Ref Range   WBC 12.4 (H) 4.0 - 10.5 K/uL   RBC 3.31 (L) 4.22 - 5.81 MIL/uL   Hemoglobin 10.1 (L) 13.0 - 17.0 g/dL   HCT 30.8 (L) 65.7 - 84.6 %   MCV 96.1 80.0 - 100.0 fL   MCH 30.5 26.0 - 34.0 pg   MCHC 31.8 30.0 - 36.0 g/dL   RDW 96.2 95.2 - 84.1 %    Platelets 406 (H) 150 - 400 K/uL   nRBC 0.0 0.0 - 0.2 %  Glucose, capillary     Status: None   Collection Time: 10/03/23  7:24 AM  Result Value Ref Range   Glucose-Capillary 86 70 - 99 mg/dL  Glucose, capillary     Status: Abnormal   Collection Time: 10/03/23 11:06 AM  Result Value Ref Range   Glucose-Capillary 109 (H) 70 - 99 mg/dL   US Venous Img Upper Uni Right(DVT) Result Date: 10/02/2023 CLINICAL DATA:  Right upper extremity edema.  Evaluate for DVT. EXAM: RIGHT UPPER EXTREMITY VENOUS DOPPLER ULTRASOUND TECHNIQUE: Gray-scale sonography with graded compression, as well as color Doppler and duplex ultrasound were performed to evaluate the upper extremity deep venous system from the level of the subclavian vein and including the jugular, axillary, basilic, radial, ulnar and upper cephalic vein. Spectral Doppler was utilized to evaluate flow at rest and with distal augmentation maneuvers. COMPARISON:  None Available. FINDINGS: Contralateral Subclavian Vein: Respiratory phasicity is normal and symmetric with the symptomatic side. No evidence of thrombus. Normal compressibility. Internal Jugular Vein: No evidence of thrombus. Normal compressibility, respiratory phasicity and response to augmentation. Subclavian Vein: No evidence of thrombus. Normal compressibility, respiratory phasicity and response to augmentation. Axillary Vein: No evidence of thrombus. Normal compressibility, respiratory phasicity and response to augmentation. Cephalic Vein: Not visualized Basilic Vein: Not visualized Brachial Veins: Appears patent where visualized. Radial Veins: Not visualized as patient refused to continue with the examination. Ulnar Veins: Not visualized as patient refused to continue with the examination. Other Findings:  None visualized. IMPRESSION: No evidence of DVT within the right upper extremity, though patient refused complete examination. Electronically Signed   By: Simonne Come M.D.   On: 10/02/2023 18:50     Assessment/Plan: Diagnosis: Pulmonary debility Does the need for close, 24 hr/day medical supervision in concert with the patient's rehab needs make it unreasonable for this patient to be served in a less intensive setting? Yes Co-Morbidities requiring supervision/potential complications:  1) COPD: monitor O2 TID 2) Underweight: provide dietary education 3) Tobacco abuse: provide smoking cessation counseling 4) HLD 5) Severe protein calorie malnutrition Due to bladder management, bowel management, safety, skin/wound care, disease management, medication administration, pain management, and patient education, does the patient require 24 hr/day rehab nursing? Yes Does the patient require coordinated care of a physician, rehab nurse, therapy disciplines of PT,  OT to address physical and functional deficits in the context of the above medical diagnosis(es)? Yes Addressing deficits in the following areas: balance, endurance, locomotion, strength, transferring, bowel/bladder control, bathing, dressing, feeding, grooming, toileting, and psychosocial support Can the patient actively participate in an intensive therapy program of at least 3 hrs of therapy per day at least 5 days per week? Yes The potential for patient to make measurable gains while on inpatient rehab is excellent Anticipated functional outcomes upon discharge from inpatient rehab are min assist  with PT, supervision with OT, supervision with SLP. Estimated rehab length of stay to reach the above functional goals is: 10-14 days Anticipated discharge destination: Home Overall Rehab/Functional Prognosis: excellent  POST ACUTE RECOMMENDATIONS: This patient's condition is appropriate for continued rehabilitative care in the following setting: CIR Patient has agreed to participate in recommended program. Yes Note that insurance prior authorization may be required for reimbursement for recommended care.    I have personally performed  a face to face diagnostic evaluation of this patient. Additionally, I have examined the patient's medical record including any pertinent labs and radiographic images. If the physician assistant has documented in this note, I have reviewed and edited or otherwise concur with the physician assistant's documentation.  Thanks,  Horton Chin, MD 10/03/2023

## 2023-10-03 NOTE — Progress Notes (Signed)
PHARMACY CONSULT NOTE - FOLLOW UP  Pharmacy Consult for Electrolyte Monitoring and Replacement   Recent Labs: Potassium (mmol/L)  Date Value  10/03/2023 3.6   Magnesium (mg/dL)  Date Value  52/84/1324 1.8   Calcium (mg/dL)  Date Value  40/06/2724 8.6 (L)   Albumin (g/dL)  Date Value  36/64/4034 2.0 (L)   Phosphorus (mg/dL)  Date Value  74/25/9563 2.6   Sodium (mmol/L)  Date Value  10/03/2023 141     Assessment: 63 y/o male with h/o COPD, DVT, HLD, GERD, pulmonary nodule and DM who is admitted with RSV, pneumonia and COPD exacerbation. Pharmacy is asked to follow and replace electrolytes while in CCU  Goal of Therapy:  Electrolytes WNL  Plan:  ---no electrolyte replacement warranted for today ---recheck electrolyte in am  Lowella Bandy ,PharmD Clinical Pharmacist 10/03/2023 7:08 AM

## 2023-10-03 NOTE — Progress Notes (Signed)
Refused CBG 

## 2023-10-03 NOTE — Progress Notes (Signed)
Patient stated earlier this am he was feeling well. Denied pain. Accepted bath from nurse tech. However later in morning pt refuses to eat or take medications (crushed in apple sauce), except for a few bites. States he has some "oxys" if he needs them. Also refused to eat any breakfast at all or Ensure; stating he wants to go get chicken from [facility]. Pt also became agitated when told we are unable to take him to get chicken, or go to his car so he can drive to store. States he is being forced to stay here. Uses profanity and other belligerent language, stating f you. Attempts repeatedly to get OOB. Bed alarm is on. Answers some questions appropriately, knows he is in [facility], but thinks he is at "rehab".  Reoriented to the fact he has been in hospital many days and it will take time to get his strength back and has to stay here for now. Also knows his name; refuses to answer other questions at this time. I have informed pt that his sister has called and plans to visit today; states "fine".

## 2023-10-04 ENCOUNTER — Inpatient Hospital Stay: Payer: 59

## 2023-10-04 DIAGNOSIS — J9621 Acute and chronic respiratory failure with hypoxia: Secondary | ICD-10-CM | POA: Diagnosis not present

## 2023-10-04 DIAGNOSIS — Z515 Encounter for palliative care: Secondary | ICD-10-CM | POA: Diagnosis not present

## 2023-10-04 DIAGNOSIS — E43 Unspecified severe protein-calorie malnutrition: Secondary | ICD-10-CM | POA: Diagnosis not present

## 2023-10-04 DIAGNOSIS — J441 Chronic obstructive pulmonary disease with (acute) exacerbation: Secondary | ICD-10-CM | POA: Diagnosis not present

## 2023-10-04 LAB — CBC
HCT: 31.3 % — ABNORMAL LOW (ref 39.0–52.0)
Hemoglobin: 10 g/dL — ABNORMAL LOW (ref 13.0–17.0)
MCH: 30.3 pg (ref 26.0–34.0)
MCHC: 31.9 g/dL (ref 30.0–36.0)
MCV: 94.8 fL (ref 80.0–100.0)
Platelets: 353 10*3/uL (ref 150–400)
RBC: 3.3 MIL/uL — ABNORMAL LOW (ref 4.22–5.81)
RDW: 14.9 % (ref 11.5–15.5)
WBC: 15.6 10*3/uL — ABNORMAL HIGH (ref 4.0–10.5)
nRBC: 0 % (ref 0.0–0.2)

## 2023-10-04 LAB — BASIC METABOLIC PANEL
Anion gap: 12 (ref 5–15)
BUN: 14 mg/dL (ref 8–23)
CO2: 25 mmol/L (ref 22–32)
Calcium: 8.1 mg/dL — ABNORMAL LOW (ref 8.9–10.3)
Chloride: 102 mmol/L (ref 98–111)
Creatinine, Ser: 0.39 mg/dL — ABNORMAL LOW (ref 0.61–1.24)
GFR, Estimated: >60 mL/min (ref >60)
Glucose, Bld: 113 mg/dL — ABNORMAL HIGH (ref 70–99)
Potassium: 3.3 mmol/L — ABNORMAL LOW (ref 3.5–5.1)
Sodium: 139 mmol/L (ref 135–145)

## 2023-10-04 LAB — PHOSPHORUS: Phosphorus: 2 mg/dL — ABNORMAL LOW (ref 2.5–4.6)

## 2023-10-04 LAB — C-REACTIVE PROTEIN: CRP: 12.4 mg/dL — ABNORMAL HIGH (ref <1.0)

## 2023-10-04 LAB — MAGNESIUM: Magnesium: 1.7 mg/dL (ref 1.7–2.4)

## 2023-10-04 LAB — GLUCOSE, CAPILLARY: Glucose-Capillary: 126 mg/dL — ABNORMAL HIGH (ref 70–99)

## 2023-10-04 MED ORDER — POTASSIUM CHLORIDE CRYS ER 20 MEQ PO TBCR
40.0000 meq | EXTENDED_RELEASE_TABLET | Freq: Once | ORAL | Status: DC
  Filled 2023-10-04: qty 2

## 2023-10-04 MED ORDER — AZITHROMYCIN 250 MG PO TABS: 250.0000 mg | ORAL_TABLET | Freq: Every day | ORAL | Status: DC

## 2023-10-04 MED ORDER — SODIUM CHLORIDE 3 % IN NEBU
4.0000 mL | INHALATION_SOLUTION | Freq: Two times a day (BID) | RESPIRATORY_TRACT | Status: DC
  Administered 2023-10-04 – 2023-10-05 (×2): 4 mL via RESPIRATORY_TRACT
  Filled 2023-10-04 (×5): qty 4

## 2023-10-04 MED ORDER — ACETYLCYSTEINE 20 % IN SOLN
4.0000 mL | RESPIRATORY_TRACT | Status: DC
  Filled 2023-10-04: qty 4

## 2023-10-04 MED ORDER — METHYLPREDNISOLONE SODIUM SUCC 40 MG IJ SOLR
20.0000 mg | Freq: Once | INTRAMUSCULAR | Status: AC
  Administered 2023-10-04: 20 mg via INTRAVENOUS
  Filled 2023-10-04: qty 1

## 2023-10-04 MED ORDER — MAGNESIUM SULFATE 2 GM/50ML IV SOLN
2.0000 g | Freq: Once | INTRAVENOUS | Status: AC
  Administered 2023-10-04: 2 g via INTRAVENOUS
  Filled 2023-10-04: qty 50

## 2023-10-04 MED ORDER — LORAZEPAM 2 MG/ML IJ SOLN
1.0000 mg | INTRAMUSCULAR | Status: AC
  Administered 2023-10-04: 1 mg via INTRAVENOUS

## 2023-10-04 MED ORDER — LORAZEPAM 2 MG/ML IJ SOLN
1.0000 mg | INTRAMUSCULAR | Status: DC | PRN
  Administered 2023-10-04 – 2023-10-05 (×2): 1 mg via INTRAVENOUS
  Filled 2023-10-04 (×3): qty 1

## 2023-10-04 MED ORDER — LORAZEPAM 2 MG/ML IJ SOLN
INTRAMUSCULAR | Status: AC
  Filled 2023-10-04: qty 1

## 2023-10-04 MED ORDER — POTASSIUM & SODIUM PHOSPHATES 280-160-250 MG PO PACK
1.0000 | PACK | Freq: Three times a day (TID) | ORAL | Status: AC
  Filled 2023-10-04: qty 1

## 2023-10-04 MED ORDER — SODIUM CHLORIDE 0.9 % IV SOLN
100.0000 mg | Freq: Two times a day (BID) | INTRAVENOUS | Status: DC
  Administered 2023-10-04 – 2023-10-05 (×3): 100 mg via INTRAVENOUS
  Filled 2023-10-04 (×3): qty 100

## 2023-10-04 MED ORDER — POTASSIUM CHLORIDE 10 MEQ/100ML IV SOLN
10.0000 meq | INTRAVENOUS | Status: AC
Start: 2023-10-04 — End: 2023-10-04
  Administered 2023-10-04 (×3): 10 meq via INTRAVENOUS
  Filled 2023-10-04 (×3): qty 100

## 2023-10-04 MED ORDER — MORPHINE SULFATE (PF) 2 MG/ML IV SOLN
1.0000 mg | INTRAVENOUS | Status: DC | PRN
  Administered 2023-10-04 – 2023-10-06 (×5): 1 mg via INTRAVENOUS
  Filled 2023-10-04 (×5): qty 1

## 2023-10-04 MED ORDER — OXYCODONE HCL 5 MG PO TABS
5.0000 mg | ORAL_TABLET | Freq: Four times a day (QID) | ORAL | Status: DC | PRN
  Administered 2023-10-05 (×2): 5 mg via ORAL
  Filled 2023-10-04 (×3): qty 1

## 2023-10-04 NOTE — Plan of Care (Signed)
   Problem: Education: Goal: Knowledge of General Education information will improve Description: Including pain rating scale, medication(s)/side effects and non-pharmacologic comfort measures Outcome: Not Progressing   Problem: Health Behavior/Discharge Planning: Goal: Ability to manage health-related needs will improve Outcome: Not Progressing   Problem: Clinical Measurements: Goal: Ability to maintain clinical measurements within normal limits will improve Outcome: Not Progressing Goal: Will remain free from infection Outcome: Not Progressing Goal: Diagnostic test results will improve Outcome: Not Progressing Goal: Respiratory complications will improve Outcome: Not Progressing Goal: Cardiovascular complication will be avoided Outcome: Not Progressing   Problem: Activity: Goal: Risk for activity intolerance will decrease Outcome: Not Progressing   Problem: Nutrition: Goal: Adequate nutrition will be maintained Outcome: Not Progressing   Problem: Coping: Goal: Level of anxiety will decrease Outcome: Not Progressing   Problem: Elimination: Goal: Will not experience complications related to bowel motility Outcome: Not Progressing Goal: Will not experience complications related to urinary retention Outcome: Not Progressing   Problem: Pain Management: Goal: General experience of comfort will improve Outcome: Not Progressing   Problem: Safety: Goal: Ability to remain free from injury will improve Outcome: Not Progressing   Problem: Skin Integrity: Goal: Risk for impaired skin integrity will decrease Outcome: Not Progressing   Problem: Education: Goal: Knowledge of disease or condition will improve Outcome: Not Progressing Goal: Knowledge of the prescribed therapeutic regimen will improve Outcome: Not Progressing   Problem: Activity: Goal: Ability to tolerate increased activity will improve Outcome: Not Progressing Goal: Will verbalize the importance of  balancing activity with adequate rest periods Outcome: Not Progressing   Problem: Respiratory: Goal: Ability to maintain a clear airway will improve Outcome: Not Progressing Goal: Levels of oxygenation will improve Outcome: Not Progressing Goal: Ability to maintain adequate ventilation will improve Outcome: Not Progressing   Problem: Education: Goal: Ability to describe self-care measures that may prevent or decrease complications (Diabetes Survival Skills Education) will improve Outcome: Not Progressing   Problem: Coping: Goal: Ability to adjust to condition or change in health will improve Outcome: Not Progressing   Problem: Fluid Volume: Goal: Ability to maintain a balanced intake and output will improve Outcome: Not Progressing   Problem: Health Behavior/Discharge Planning: Goal: Ability to identify and utilize available resources and services will improve Outcome: Not Progressing Goal: Ability to manage health-related needs will improve Outcome: Not Progressing   Problem: Metabolic: Goal: Ability to maintain appropriate glucose levels will improve Outcome: Not Progressing   Problem: Nutritional: Goal: Maintenance of adequate nutrition will improve Outcome: Not Progressing Goal: Progress toward achieving an optimal weight will improve Outcome: Not Progressing   Problem: Skin Integrity: Goal: Risk for impaired skin integrity will decrease Outcome: Not Progressing   Problem: Tissue Perfusion: Goal: Adequacy of tissue perfusion will improve Outcome: Not Progressing   Problem: Activity: Goal: Ability to tolerate increased activity will improve Outcome: Not Progressing   Problem: Respiratory: Goal: Ability to maintain a clear airway and adequate ventilation will improve Outcome: Not Progressing   Problem: Role Relationship: Goal: Method of communication will improve Outcome: Not Progressing

## 2023-10-04 NOTE — Plan of Care (Signed)
Pt continued to refuse care and mobility, as well as po meds, and po intake (dysphagia 1 diet). Remained belligerent today despite family being at bedside and attempting to negotiate with pt.  Complained of pain in coccyx area (pressure injury); received orders for IV morphine and po oxy. Also received order for IV ativan. Admitted that pain was some improved, and also napped this evening after ativan. Telemetry showed sinus tach into 140's today. Refused potassium supplements, po eliquis this am, and po prednisone. Given IV solumedrol, and IV potassium as ordered. Consented to taking only po eliquis with applesauce this afternoon. IV doxycycline also ordered and given; CXR completed and reviewed by Dr. Karna Christmas. Palliative NP in to consult with family.   Problem: Education: Goal: Knowledge of General Education information will improve Description: Including pain rating scale, medication(s)/side effects and non-pharmacologic comfort measures Outcome: Not Progressing   Problem: Health Behavior/Discharge Planning: Goal: Ability to manage health-related needs will improve Outcome: Not Progressing   Problem: Clinical Measurements: Goal: Ability to maintain clinical measurements within normal limits will improve Outcome: Progressing Goal: Will remain free from infection Outcome: Progressing Goal: Diagnostic test results will improve Outcome: Progressing Goal: Respiratory complications will improve Outcome: Progressing Goal: Cardiovascular complication will be avoided Outcome: Progressing   Problem: Activity: Goal: Risk for activity intolerance will decrease Outcome: Not Progressing   Problem: Nutrition: Goal: Adequate nutrition will be maintained Outcome: Not Progressing   Problem: Coping: Goal: Level of anxiety will decrease Outcome: Not Progressing   Problem: Elimination: Goal: Will not experience complications related to bowel motility Outcome: Not Progressing   Problem: Pain  Management: Goal: General experience of comfort will improve Outcome: Progressing   Problem: Safety: Goal: Ability to remain free from injury will improve Outcome: Not Progressing   Problem: Skin Integrity: Goal: Risk for impaired skin integrity will decrease Outcome: Not Progressing   Problem: Activity: Goal: Ability to tolerate increased activity will improve Outcome: Not Progressing Goal: Will verbalize the importance of balancing activity with adequate rest periods Outcome: Not Progressing   Problem: Respiratory: Goal: Ability to maintain a clear airway will improve Outcome: Progressing Goal: Levels of oxygenation will improve Outcome: Progressing Goal: Ability to maintain adequate ventilation will improve Outcome: Progressing   Problem: Fluid Volume: Goal: Ability to maintain a balanced intake and output will improve Outcome: Progressing   Problem: Metabolic: Goal: Ability to maintain appropriate glucose levels will improve Outcome: Not Progressing   Problem: Nutritional: Goal: Maintenance of adequate nutrition will improve Outcome: Not Progressing Goal: Progress toward achieving an optimal weight will improve Outcome: Not Progressing   Problem: Skin Integrity: Goal: Risk for impaired skin integrity will decrease Outcome: Not Progressing   Problem: Activity: Goal: Ability to tolerate increased activity will improve Outcome: Not Progressing   Problem: Respiratory: Goal: Ability to maintain a clear airway and adequate ventilation will improve Outcome: Progressing

## 2023-10-04 NOTE — Progress Notes (Signed)
0400 CBG refused

## 2023-10-04 NOTE — Progress Notes (Signed)
PHARMACY CONSULT NOTE  Pharmacy Consult for Electrolyte Monitoring and Replacement   Recent Labs: Potassium (mmol/L)  Date Value  10/04/2023 3.3 (L)   Magnesium (mg/dL)  Date Value  81/19/1478 1.7   Calcium (mg/dL)  Date Value  29/56/2130 8.1 (L)   Albumin (g/dL)  Date Value  86/57/8469 2.0 (L)   Phosphorus (mg/dL)  Date Value  62/95/2841 2.0 (L)   Sodium (mmol/L)  Date Value  10/04/2023 139   Assessment: 63 y/o male with h/o COPD, DVT, HLD, GERD, pulmonary nodule and DM who is admitted with RSV, pneumonia and COPD exacerbation. Pharmacy is asked to follow and replace electrolytes while in CCU  Goal of Therapy:  Electrolytes WNL  Plan:  --K 3.3, Kcl 40 mEq PO x 1 dose --Phos 2, Phos-Nak 1 packet TID x 3 doses --Mg 1.7, magnesium sulfate 2 g IV x 1 --recheck electrolyte in am  Tressie Ellis 10/04/2023 9:05 AM

## 2023-10-04 NOTE — Progress Notes (Signed)
Daily Progress Note   Patient Name: Benjamin Bolton       Date: 10/04/2023 DOB: [birth year]  Age: 63 y.o. MRN#: [id number] Attending Physician: Vida Rigger, MD Primary Care Physician: [facility] Admit Date: 09/10/2023  Reason for Consultation/Follow-up: Establishing goals of care  HPI/Brief Hospital Review: 63 y.o. 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    1/11, rapid response, transferred to ICU, patient was intubated and sedated..  1/12, remains vented, sedated, does not follow commands.  Failed SBT  1/13, overnight due to concerns of aspiration TF's held.  Will repeat SBT today 1/14, overnight pt had possible seizure activity with rhythmic tremors noted in the mouth/lower face/neck during episode pt unresponsive to pain.  Received 2 mg of versed and 2g keppra bolus with resolution of symptoms.  CT Head negative.  Standing dose of keppra 1g bid.  Neurology consulted.    1/15, on minimal vent settings, unable to tolerate WUA due to increased WOB. Shifting measures given for Hyperkalemia of 5.5 with peaked T waves.  Abdomen distended, KUB without evidence of obstruction, give Lactulose.  Diurese with 40 mg IV Lasix x1. 1/16, Failed WUA, with increased WOB and hypoxic with O2 sats dropping to mid 70's.  Add Seroquel.   1/17-1/19 failed WUA/SBT 1/19 concern for recurrent sepsis due to fevers developing overnight and increased vasopressor support 09/29/23- Extubated to BIPAP, I met with family at bedside and reviewed medical plan as well as answered questions.  09/30/23-  patient is more awake and able to speak few words asking for water.  10/01/23- patient on HFNC 40/40 able to communicate verbally, will perform SLP and PT/OT eval. Req precedex for aggitation weaning.  CRP trending down and steroids tapering in parallel.  Requested to engage by CCM team on 2/1  Palliative Medicine consulted for assisting with goals of care conversations.  Subjective: Extensive chart review has been completed prior to meeting patient including labs, vital signs, imaging, progress notes, orders, and available advanced directive documents from current and previous encounters.    Collaborated with CCM team, concern due to lack of engagement from Benjamin Bolton, refusing medications, PT/OT and  with continued delirium.  Visited with Benjamin Bolton at his bedside. He is unable to answer orientation questions appropriately. He adamantly requests to leave and requesting beer.  Returned to bedside when sisters visiting, sisters attempting to have Benjamin Bolton understand the severity of his condition. Attempted to convince him to take medications, nursing staff able to administer Eliquis with applesauce. He continues to request that his sisters take him home. He remains unable to to engage in goals of care conversations.  Discussed with sisters ongoing concern related to high risk of decompensation due to Benjamin Bolton not being receptive to therapy or medication administration.  We also discussed at this time, Benjamin Bolton would not have capacity to make his own complex medical decisions as he is unable to appropriately answer orientation questions. Decision making would again be on his wife-who Benjamin Bolton has been separated from for many years but is still legally married.  Sisters and I discussed possibility of psych consult to assess for underlying psychiatric disorders. Discussed with CCM team, psych assessment not appropriate at this time due to ongoing acute illness.  Answered and addressed all  questions and concerns. PMT will continue to shadow and follow along peripherally for decline/decompensation or if goals change.  Care plan was discussed with CCM team and nursing staff  Thank you for allowing the Palliative Medicine Team to assist in the care of this patient.  Total time:  35 minutes  Time spent includes: Detailed review of medical records (labs, imaging, vital signs), medically appropriate exam (mental status, respiratory, cardiac, skin), discussed with treatment team, counseling and educating patient, family and staff, documenting clinical information, medication management and coordination of care.  Leeanne Deed, DNP, AGNP-C Palliative Medicine   Please contact Palliative Medicine Team phone at [phone] for questions and concerns.

## 2023-10-04 NOTE — Progress Notes (Signed)
NAME:  Benjamin Bolton, MRN:  [id number], DOB:  February 01, 1961, LOS: 24 ADMISSION DATE:  09/10/2023, CONSULTATION DATE: 09/13/2023  Brief Pt Description / Synopsis:  63 y.o. 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     Significant Hospital Events: Including procedures, antibiotic start and stop dates in addition to other pertinent events   1/08: admit to [facility] 1/11: rapid response, transfer to ICU, intubated 1/12: remains vented, sedated, does not follow commands.  Failed SBT  1/13: Overnight due to concerns of aspiration TF's held.  Will repeat SBT today 1/14: Overnight pt had possible seizure activity with rhythmic tremors noted in the mouth/lower face/neck during episode pt unresponsive to pain.  Received 2 mg of versed and 2g keppra bolus with resolution of symptoms.  CT Head negative.  Standing dose of keppra 1g bid.  Neurology consulted.    1/15: On minimal vent settings, unable to tolerate WUA due to increased WOB. Shifting measures given for Hyperkalemia of 5.5 with peaked T waves.  Abdomen distended, KUB without evidence of obstruction, give Lactulose.  Diurese with 40 mg IV Lasix x1. 1/16: Failed WUA, with increased WOB and hypoxic with O2 sats dropping to mid 70's.  Add Seroquel.  Palliative Care consulted to assist with GOC. 1/17: Precedex started for WUA.  Failed SBT due to increased WOB, tachypnea, and hypoxia.  Increase Seroquel to 50 mg BID. Transition Heparin to Elqiuis 1/18: Pt remains mechanically intubated and has failed multiple SBT's.  Planning for family meeting today at bedside with palliative care and PCCM will perform SBT once family arrives at bedside.  Remains sedated with propofol and fentanyl gtts.  Family meeting held code status changed to DNR per family  request.  Pts family trying to decide if they want to proceed with tracheostomy.  Required additional sedation and prn vecuronium overnight due to vent dyssynchrony  1/19: Pt febrile overnight with increased vasopressor requirements levophed gtt @12  mcg/min and febrile tmax 101 degrees F concerning for recurrent sepsis.  Broad spectrum abx started. Pt remains mechanically intubated and unable to successfully liberate from ventilator  1/20 remains on vent, severe rap failure 1/21 remains on vent prolonged exp phase air trapping  on vent 1/22 remains on vent 1/23 CT ABD- Colonic ileus pattern shows significant improvement since the prior radiograph with no further significant gaseous distension of the colon. 1/23 CT chest B/L Lower lobe pneumonia severe emphysema 1/23 CT head-possible cerebral edema 1/23 remains on vent failure to wean 1/24 remains intubated, severe hypoxia, shock 1/25 remains intubated, severe hypoxia, shock 09/29/23- Extubated to BIPAP, I met with family at bedside and reviewed medical plan as well as answered questions.  09/30/23- patient is more awake and able to speak few words asking for water.  10/01/23- patient on HFNC 40/40 able to communicate verbally, will perform SLP and PT/OT eval. Req precedex for aggitation weaning.  CRP trending down and steroids tapering in parallel. 10/02/23- patient weaned to 4L/min, he is eating.  For PT/OT today.  Patient being optimized for TRH transfer.  Weaning from precedex. Patient reports depression but expresses that he wishes to live and wants to continue working towards improvement.  10/03/23- patient was aggitated today and was curing at staff.  He did bedside swallow and passed.  He is on prednisone with mild leukocytosis. Today he  is being optimized for transfer to medical floor.  10/04/23- patient is having weak cough and is not compliant with medications.  He refuses most therapy ordered.  I have called multiple family members and asked  them to come in to encourage him to work with [place]. His wife and sister came in and report he wants to use crack which he normally does routinely.  I've offered medications for withdrawal and called palliative care evaluation due to overall poor prognosis.    Pertinent  Medical History  DVT on Eliquis COPD on 2L O2 Type II Diabetes Mellitus  GERD HLD Pulmonary Nodule Tobacco Abuse   Micro Data:  1/8: SARS-CoV-2 PCR>>negative 1/8: Blood culture x2>> no growth 1/8: Urine>> multiple species (suggest recollection) 1/9: RVP>> + RSV 1/9: Sputum>> normal respiratory flora 1/9: HIV Screen>> non reactive 1/11: MRSA PCR>> negative 1/19: MRSA PCR>> 1/19: RVP>>  Antimicrobials:  doxy IV bid and zithromax IV   Anti-infectives (From admission, onward)    Start     Dose/Rate Route Frequency Ordered Stop   09/29/23 2000  azithromycin (ZITHROMAX) 250 mg in dextrose 5 % 125 mL IVPB        250 mg 127.5 mL/hr over 60 Minutes Intravenous Every 24 hours 09/29/23 1801     09/29/23 1245  azithromycin (ZITHROMAX) tablet 250 mg  Status:  Discontinued        250 mg Per Tube Daily 09/29/23 1150 09/29/23 1801   09/25/23 1200  linezolid (ZYVOX) IVPB 600 mg  Status:  Discontinued        600 mg 300 mL/hr over 60 Minutes Intravenous Every 12 hours 09/25/23 0918 09/26/23 1206   09/25/23 1015  ceFEPIme (MAXIPIME) 2 g in sodium chloride 0.9 % 100 mL IVPB  Status:  Discontinued        2 g 200 mL/hr over 30 Minutes Intravenous Every 8 hours 09/25/23 0918 09/29/23 1053   09/25/23 0000  ceFAZolin (ANCEF) IVPB 2g/100 mL premix        2 g 200 mL/hr over 30 Minutes Intravenous  Once 09/23/23 1103 09/25/23 0013   09/22/23 0010  vancomycin (VANCOCIN) IVPB 750 mg/150 ml premix  Status:  Discontinued        750 mg 150 mL/hr over 60 Minutes Intravenous Every 12 hours 09/22/23 0004 09/22/23 1039   09/22/23 0000  Vancomycin (VANCOCIN) 750 mg IVPB  Status:  Discontinued        750 mg 150 mL/hr over 60 Minutes Intravenous  Every 12 hours 09/21/23 1120 09/21/23 2356   09/22/23 0000  vancomycin (VANCOREADY) IVPB 750 mg/150 mL  Status:  Discontinued        750 mg 150 mL/hr over 60 Minutes Intravenous Every 12 hours 09/21/23 2356 09/22/23 0004   09/21/23 1300  vancomycin (VANCOCIN) IVPB 1000 mg/200 mL premix        1,000 mg 200 mL/hr over 60 Minutes Intravenous  Once 09/21/23 1120 09/21/23 1900   09/21/23 1215  piperacillin-tazobactam (ZOSYN) IVPB 3.375 g  Status:  Discontinued        3.375 g 12.5 mL/hr over 240 Minutes Intravenous Every 8 hours 09/21/23 1120 09/23/23 1257   09/21/23 1130  vancomycin (VANCOREADY) IVPB 1250 mg/250 mL  Status:  Discontinued        1,250 mg 166.7 mL/hr over 90 Minutes Intravenous  Once 09/21/23 1046 09/21/23 1120   09/21/23 1100  piperacillin-tazobactam (ZOSYN) IVPB 3.375 g  Status:  Discontinued        3.375 g 12.5 mL/hr  over 240 Minutes Intravenous  Once 09/21/23 1046 09/21/23 1152   09/13/23 1445  piperacillin-tazobactam (ZOSYN) IVPB 3.375 g        3.375 g 12.5 mL/hr over 240 Minutes Intravenous Every 8 hours 09/13/23 1351 09/17/23 0157   09/10/23 2315  cefTRIAXone (ROCEPHIN) 1 g in sodium chloride 0.9 % 100 mL IVPB  Status:  Discontinued        1 g 200 mL/hr over 30 Minutes Intravenous Every 24 hours 09/10/23 2305 09/13/23 1336          REVIEW OF SYSTEMS   Patient reports anxiety and depression , denies CP, denies dyspnea despite visible shallow breathing, aside from this 10 point ROS is negative   PHYSICAL EXAMINATION:   GENERAL:chronically ill appearing age appropriate EYES: Pupils equal, round, reactive to light.  No scleral icterus.  MOUTH: Moist mucosal membrane. I NECK: Supple.  PULMONARY: Lungs clear to auscultation, +rhonchi, CARDIOVASCULAR: S1 and S2.  Regular rate and rhythm GASTROINTESTINAL: Soft, nontender, -distended. Positive bowel sounds.  MUSCULOSKELETAL: edema.  NEUROLOGIC: mild confusion and aggitation SKIN:normal, warm to touch, Capillary  refill delayed  Pulses present bilaterally      Interim History / Subjective:   1.Acute on chronic hypoxemic respiratory failure          RSV pneumonia      - Extubated 09/29/23     Leukocytosis trending up, have broadened coverage for post viral secondary PNA  2. Advanced COPD       Continue zithromax       S/p decadeon x 1 pre liberation , on on prednisone taper  3. PAF - dcd heparin gtt       Currentnly in SR  - on heprin sq   ENDO - ICU hypoglycemic\Hyperglycemia protocol -check FSBS per protocol     GI GI PROPHYLAXIS as indicated NUTRITIONAL STATUS DIET-->TF's as tolerated Constipation protocol as indicated CT ABD ILEUS IMPROVING   ELECTROLYTES -follow labs as needed -replace as needed -pharmacy consultation and following   RESTRICTIVE TRANSFUSION PROTOCOL TRANSFUSION  IF HGB<7  or ACTIVE BLEEDING OR DX of ACUTE CORONARY SYNDROMES     CT ABD ILEUS IMPROVING      Best Practice (right click and "Reselect all SmartList Selections" daily)    Diet/type: TF's DVT prophylaxis heparin Pressure ulcer(s): N/A GI prophylaxis: Pepcid  Lines: RUE PICC Line and still needed  Foley:  External Catheter  Code Status: DNR  Objective   Blood pressure (!) 141/90, pulse (!) 117, temperature 98.1 F (36.7 C), temperature source Axillary, resp. rate (!) 39, height 6' (1.829 m), weight 57.9 kg, SpO2 93%.        Intake/Output Summary (Last 24 hours) at 10/04/2023 0840 Last data filed at 10/04/2023 0500 Gross per 24 hour  Intake 0 ml  Output 950 ml  Net -950 ml   Filed Weights   10/02/23 0456 10/03/23 0500 10/04/23 0500  Weight: 59.8 kg 59.2 kg 57.9 kg   Critical care provider statement:   Total critical care time: 33 minutes   Performed by: Karna Christmas MD   Critical care time was exclusive of separately billable procedures and treating other patients.   Critical care was necessary to treat or prevent imminent or life-threatening deterioration.   Critical care  was time spent personally by me on the following activities: development of treatment plan with patient and/or surrogate as well as nursing, discussions with consultants, evaluation of patient's response to treatment, examination of patient, obtaining history from patient or surrogate, ordering  and performing treatments and interventions, ordering and review of laboratory studies, ordering and review of radiographic studies, pulse oximetry and re-evaluation of patient's condition.    Vida Rigger, M.D.  Pulmonary & Critical Care Medicine        Vida Rigger, M.D.  Pulmonary & Critical Care Medicine

## 2023-10-05 ENCOUNTER — Inpatient Hospital Stay: Payer: 59

## 2023-10-05 DIAGNOSIS — J441 Chronic obstructive pulmonary disease with (acute) exacerbation: Secondary | ICD-10-CM | POA: Diagnosis not present

## 2023-10-05 DIAGNOSIS — J9621 Acute and chronic respiratory failure with hypoxia: Secondary | ICD-10-CM | POA: Diagnosis not present

## 2023-10-05 DIAGNOSIS — Z515 Encounter for palliative care: Secondary | ICD-10-CM | POA: Diagnosis not present

## 2023-10-05 LAB — BASIC METABOLIC PANEL
Anion gap: 9 (ref 5–15)
BUN: 20 mg/dL (ref 8–23)
CO2: 26 mmol/L (ref 22–32)
Calcium: 8.3 mg/dL — ABNORMAL LOW (ref 8.9–10.3)
Chloride: 104 mmol/L (ref 98–111)
Creatinine, Ser: 0.48 mg/dL — ABNORMAL LOW (ref 0.61–1.24)
GFR, Estimated: >60 mL/min (ref >60)
Glucose, Bld: 136 mg/dL — ABNORMAL HIGH (ref 70–99)
Potassium: 4.1 mmol/L (ref 3.5–5.1)
Sodium: 139 mmol/L (ref 135–145)

## 2023-10-05 LAB — MAGNESIUM: Magnesium: 2.1 mg/dL (ref 1.7–2.4)

## 2023-10-05 LAB — PHOSPHORUS: Phosphorus: 2.6 mg/dL (ref 2.5–4.6)

## 2023-10-05 LAB — GLUCOSE, CAPILLARY
Glucose-Capillary: 125 mg/dL — ABNORMAL HIGH (ref 70–99)
Glucose-Capillary: 125 mg/dL — ABNORMAL HIGH (ref 70–99)
Glucose-Capillary: 124 mg/dL — ABNORMAL HIGH (ref 70–99)
Glucose-Capillary: 148 mg/dL — ABNORMAL HIGH (ref 70–99)

## 2023-10-05 LAB — C-REACTIVE PROTEIN: CRP: 16.6 mg/dL — ABNORMAL HIGH (ref <1.0)

## 2023-10-05 MED ORDER — ACETYLCYSTEINE 20 % IN SOLN
4.0000 mL | Freq: Two times a day (BID) | RESPIRATORY_TRACT | Status: DC
Start: 2023-10-05 — End: 2023-10-06
  Administered 2023-10-05: 4 mL via RESPIRATORY_TRACT
  Filled 2023-10-05 (×2): qty 4

## 2023-10-05 MED ORDER — NICOTINE 21 MG/24HR TD PT24: 21.0000 mg | MEDICATED_PATCH | Freq: Every day | TRANSDERMAL | Status: DC

## 2023-10-05 MED ORDER — VITAMIN C 500 MG PO TABS
500.0000 mg | ORAL_TABLET | Freq: Two times a day (BID) | ORAL | Status: DC
Start: 2023-10-05 — End: 2023-10-05

## 2023-10-05 MED ORDER — NICOTINE 21 MG/24HR TD PT24
21.0000 mg | MEDICATED_PATCH | Freq: Every day | TRANSDERMAL | Status: DC
Start: 2023-10-05 — End: 2023-10-06
  Administered 2023-10-05 – 2023-10-06 (×2): 21 mg via TRANSDERMAL
  Filled 2023-10-05 (×2): qty 1

## 2023-10-05 MED ORDER — LACTATED RINGERS IV SOLN
INTRAVENOUS | Status: AC
Start: 2023-10-06 — End: 2023-10-06

## 2023-10-05 MED ORDER — PREDNISONE 10 MG PO TABS
5.0000 mg | ORAL_TABLET | Freq: Every day | ORAL | Status: DC
Start: 2023-10-12 — End: 2023-10-06

## 2023-10-05 MED ORDER — PREDNISONE 20 MG PO TABS: 20.0000 mg | ORAL_TABLET | Freq: Every day | ORAL | Status: DC

## 2023-10-05 MED ORDER — ACETAMINOPHEN 325 MG PO TABS: 650.0000 mg | ORAL_TABLET | Freq: Four times a day (QID) | ORAL | Status: DC | PRN

## 2023-10-05 MED ORDER — APIXABAN 5 MG PO TABS: 5.0000 mg | ORAL_TABLET | Freq: Two times a day (BID) | ORAL | Status: DC

## 2023-10-05 MED ORDER — ACETAMINOPHEN 325 MG PO TABS
650.0000 mg | ORAL_TABLET | Freq: Four times a day (QID) | ORAL | Status: DC | PRN
  Administered 2023-10-05: 650 mg via ORAL
  Filled 2023-10-05: qty 2

## 2023-10-05 MED ORDER — ADULT MULTIVITAMIN W/MINERALS CH
1.0000 | ORAL_TABLET | Freq: Every day | ORAL | Status: DC
Start: 2023-10-06 — End: 2023-10-06
  Administered 2023-10-06: 1 via ORAL
  Filled 2023-10-05: qty 1

## 2023-10-05 MED ORDER — LACTATED RINGERS IV BOLUS
500.0000 mL | Freq: Once | INTRAVENOUS | Status: AC
Start: 2023-10-05 — End: 2023-10-05
  Administered 2023-10-05: 500 mL via INTRAVENOUS

## 2023-10-05 MED ORDER — VITAMIN C 500 MG PO TABS
500.0000 mg | ORAL_TABLET | Freq: Two times a day (BID) | ORAL | Status: DC
  Administered 2023-10-05 – 2023-10-06 (×2): 500 mg via ORAL
  Filled 2023-10-05 (×2): qty 1

## 2023-10-05 MED ORDER — PREDNISONE 20 MG PO TABS: 25.0000 mg | ORAL_TABLET | Freq: Every day | ORAL | Status: DC

## 2023-10-05 MED ORDER — PREDNISONE 20 MG PO TABS
20.0000 mg | ORAL_TABLET | Freq: Every day | ORAL | Status: DC
Start: 2023-10-09 — End: 2023-10-05

## 2023-10-05 MED ORDER — PREDNISONE 20 MG PO TABS: 35.0000 mg | ORAL_TABLET | Freq: Every day | ORAL | Status: DC

## 2023-10-05 MED ORDER — PREDNISONE 10 MG PO TABS: 5.0000 mg | ORAL_TABLET | Freq: Every day | ORAL | Status: DC

## 2023-10-05 MED ORDER — OXYCODONE HCL 5 MG PO TABS
5.0000 mg | ORAL_TABLET | Freq: Four times a day (QID) | ORAL | Status: DC | PRN
  Administered 2023-10-05: 5 mg via NASOGASTRIC
  Filled 2023-10-05: qty 1

## 2023-10-05 MED ORDER — LACTATED RINGERS IV BOLUS
1000.0000 mL | Freq: Once | INTRAVENOUS | Status: AC
  Administered 2023-10-05: 1000 mL via INTRAVENOUS

## 2023-10-05 MED ORDER — PREDNISONE 10 MG PO TABS: 10.0000 mg | ORAL_TABLET | Freq: Every day | ORAL | Status: DC

## 2023-10-05 MED ORDER — VANCOMYCIN HCL 750 MG IV SOLR
750.0000 mg | Freq: Two times a day (BID) | INTRAVENOUS | Status: DC
  Administered 2023-10-06: 750 mg via INTRAVENOUS
  Filled 2023-10-05 (×2): qty 15

## 2023-10-05 MED ORDER — ADULT MULTIVITAMIN W/MINERALS CH: 1.0000 | ORAL_TABLET | Freq: Every day | ORAL | Status: DC

## 2023-10-05 MED ORDER — PREDNISONE 10 MG PO TABS: 15.0000 mg | ORAL_TABLET | Freq: Every day | ORAL | Status: DC

## 2023-10-05 MED ORDER — PREDNISONE 20 MG PO TABS: 30.0000 mg | ORAL_TABLET | Freq: Every day | ORAL | Status: DC

## 2023-10-05 MED ORDER — OXYCODONE HCL 5 MG PO TABS: 5.0000 mg | ORAL_TABLET | Freq: Four times a day (QID) | ORAL | Status: DC | PRN

## 2023-10-05 MED ORDER — PREDNISONE 10 MG PO TABS
15.0000 mg | ORAL_TABLET | Freq: Every day | ORAL | Status: DC
Start: 2023-10-10 — End: 2023-10-05

## 2023-10-05 MED ORDER — APIXABAN 5 MG PO TABS
5.0000 mg | ORAL_TABLET | Freq: Two times a day (BID) | ORAL | Status: DC
Start: 2023-10-05 — End: 2023-10-06
  Administered 2023-10-05 – 2023-10-06 (×2): 5 mg via ORAL
  Filled 2023-10-05 (×2): qty 1

## 2023-10-05 MED ORDER — PREDNISONE 20 MG PO TABS
35.0000 mg | ORAL_TABLET | Freq: Every day | ORAL | Status: AC
  Administered 2023-10-06: 35 mg via ORAL
  Filled 2023-10-05: qty 2

## 2023-10-05 MED ORDER — VANCOMYCIN HCL 1250 MG/250ML IV SOLN
1250.0000 mg | Freq: Once | INTRAVENOUS | Status: AC
  Administered 2023-10-05: 1250 mg via INTRAVENOUS
  Filled 2023-10-05: qty 250

## 2023-10-05 MED ORDER — SODIUM CHLORIDE 0.9 % IV SOLN: INTRAVENOUS | Status: AC | PRN

## 2023-10-05 MED ORDER — VANCOMYCIN HCL 750 MG/150ML IV SOLN
750.0000 mg | Freq: Two times a day (BID) | INTRAVENOUS | Status: DC
Start: 2023-10-06 — End: 2023-10-05
  Filled 2023-10-05: qty 150

## 2023-10-05 MED ORDER — PIPERACILLIN-TAZOBACTAM 3.375 G IVPB
3.3750 g | Freq: Three times a day (TID) | INTRAVENOUS | Status: AC
  Administered 2023-10-05 – 2023-10-09 (×13): 3.375 g via INTRAVENOUS
  Filled 2023-10-05 (×13): qty 50

## 2023-10-05 NOTE — Plan of Care (Signed)
  Problem: Clinical Measurements: Goal: Diagnostic test results will improve Outcome: Progressing Goal: Respiratory complications will improve Outcome: Progressing   Problem: Nutrition: Goal: Adequate nutrition will be maintained Outcome: Progressing   Problem: Elimination: Goal: Will not experience complications related to urinary retention Outcome: Progressing   Problem: Safety: Goal: Ability to remain free from injury will improve Outcome: Progressing   Problem: Skin Integrity: Goal: Risk for impaired skin integrity will decrease Outcome: Progressing   Problem: Respiratory: Goal: Ability to maintain a clear airway will improve Outcome: Progressing Goal: Levels of oxygenation will improve Outcome: Progressing Goal: Ability to maintain adequate ventilation will improve Outcome: Progressing

## 2023-10-05 NOTE — Plan of Care (Signed)
  Problem: Education: Goal: Knowledge of General Education information will improve Description: Including pain rating scale, medication(s)/side effects and non-pharmacologic comfort measures Outcome: Progressing   Problem: Health Behavior/Discharge Planning: Goal: Ability to manage health-related needs will improve Outcome: Progressing   Problem: Clinical Measurements: Goal: Ability to maintain clinical measurements within normal limits will improve Outcome: Progressing Goal: Will remain free from infection Outcome: Progressing Goal: Diagnostic test results will improve Outcome: Progressing Goal: Respiratory complications will improve Outcome: Progressing Goal: Cardiovascular complication will be avoided Outcome: Progressing   Problem: Activity: Goal: Risk for activity intolerance will decrease Outcome: Progressing   Problem: Nutrition: Goal: Adequate nutrition will be maintained Outcome: Progressing   Problem: Coping: Goal: Level of anxiety will decrease Outcome: Progressing   Problem: Elimination: Goal: Will not experience complications related to bowel motility Outcome: Progressing Goal: Will not experience complications related to urinary retention Outcome: Progressing   Problem: Pain Management: Goal: General experience of comfort will improve Outcome: Progressing   Problem: Safety: Goal: Ability to remain free from injury will improve Outcome: Progressing   Problem: Skin Integrity: Goal: Risk for impaired skin integrity will decrease Outcome: Progressing   Problem: Education: Goal: Knowledge of disease or condition will improve Outcome: Progressing Goal: Knowledge of the prescribed therapeutic regimen will improve Outcome: Progressing   Problem: Activity: Goal: Ability to tolerate increased activity will improve Outcome: Progressing Goal: Will verbalize the importance of balancing activity with adequate rest periods Outcome: Progressing   Problem:  Respiratory: Goal: Ability to maintain a clear airway will improve Outcome: Progressing Goal: Levels of oxygenation will improve Outcome: Progressing Goal: Ability to maintain adequate ventilation will improve Outcome: Progressing   Problem: Education: Goal: Ability to describe self-care measures that may prevent or decrease complications (Diabetes Survival Skills Education) will improve Outcome: Progressing   Problem: Coping: Goal: Ability to adjust to condition or change in health will improve Outcome: Progressing   Problem: Fluid Volume: Goal: Ability to maintain a balanced intake and output will improve Outcome: Progressing   Problem: Health Behavior/Discharge Planning: Goal: Ability to identify and utilize available resources and services will improve Outcome: Progressing Goal: Ability to manage health-related needs will improve Outcome: Progressing   Problem: Metabolic: Goal: Ability to maintain appropriate glucose levels will improve Outcome: Progressing   Problem: Nutritional: Goal: Maintenance of adequate nutrition will improve Outcome: Progressing Goal: Progress toward achieving an optimal weight will improve Outcome: Progressing   Problem: Skin Integrity: Goal: Risk for impaired skin integrity will decrease Outcome: Progressing   Problem: Tissue Perfusion: Goal: Adequacy of tissue perfusion will improve Outcome: Progressing   Problem: Activity: Goal: Ability to tolerate increased activity will improve Outcome: Progressing   Problem: Respiratory: Goal: Ability to maintain a clear airway and adequate ventilation will improve Outcome: Progressing   Problem: Role Relationship: Goal: Method of communication will improve Outcome: Progressing

## 2023-10-05 NOTE — Progress Notes (Signed)
Speech Language Pathology Treatment: Dysphagia  Patient Details Name: Benjamin Bolton MRN: [id number] DOB: [birth year] Today's Date: 10/05/2023 Time: 7829-5621 SLP Time Calculation (min) (ACUTE ONLY): 12 min  Assessment / Plan / Recommendation Clinical Impression  Pt seen for ongoing dysphagia management. Pt initially asleep but is able to arouse with his verbal name and gentle touch. Pt difficult to re-direct to task of consuming ice chips, thin liquids. SLP attempted to visually redirect pt by having pt hold cup in his hand but pt not willing to engage. Pt refused attempts at POs at this time. In reviewing pt's chart, pt has refused ST services x 3 attempts. We will sign off at this time.   Of note, as SLP exited room and worked at nursing station, pt was observed to be talking to individuals not present in his room. H ewas not able to provide any clarity on who he was speaking with.    HPI HPI: Pt is a 63 y.o. 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   At initial admit, patient states that he has a shortness of breath for more than 3 days, which has been progressively worsening.  He has cough with thick mucus production.  He has chills, but no fever.  No chest pain.  Of note, patient is using 3 L oxygen normally at home, but was found to have acute respiratory distress, difficulty speaking in full sentence, oxygen desaturation to 80s, initially started on 5-6 L oxygen, which is weaned off to home level 3 L oxygen after giving IV Solu-Medrol and nebulizer in ED.  Patient was admitted to [facility] and initiated on nebulizers, steroids, and antibiotics. He was managed medically for COPD exacerbation and UTI but developed worsening respiratory distress this AM requiring the initiation of BiPAP. Rapid  response was called and he was transferred to the ICU. Initial blood gas showed pH of 7.2, with repeat VBG showing he remained acidemic at 7.18. Given altered mental status and respiratory failure, decision made at the bedside to proceed with emergent intubation.  During the lengthy intubation period, there was concern of aspiration of TFs; also sepsis.  ENT was consulted for trach, PEG.  Palliative Care consulted; Family trying to decide if they want to proceed with tracheostomy.  Pt was extubated on 09/29/2023 per MD note: "Pt extubated to bipap today." w/ no plan to reintubate per report.  On 09/30/23- "patient is more awake and able to speak few words asking for water." per MD note.  ST consult was placed on 09/30/23 for CSE.      SLP Plan  Discharge SLP treatment due to (comment) (decreased participation in services)      Recommendations for follow up therapy are one component of a multi-disciplinary discharge planning process, led by the attending physician.  Recommendations may be updated based on patient status, additional functional criteria and insurance authorization.    Recommendations  Diet recommendations: Dysphagia 1 (puree);Thin liquid Liquids provided via: Cup;Straw Medication Administration: Crushed with puree Supervision: Staff to assist with self feeding;Full supervision/cueing for compensatory strategies Compensations: Minimize environmental distractions;Slow rate;Small sips/bites Postural Changes and/or Swallow Maneuvers: Seated upright 90 degrees;Upright 30-60 min after meal                  Oral care BID;Oral care before and  after PO;Staff/trained caregiver to provide oral care     Dysphagia, unspecified (R13.10)     Discharge SLP treatment due to (comment) (decreased participation in services)    Welby Montminy B. Dreama Saa, M.S., CCC-SLP, Tree surgeon Certified Brain Injury Specialist [facility]  [facility] Rehabilitation  Services Office [phone] Ascom [phone] Fax [phone]

## 2023-10-05 NOTE — Progress Notes (Signed)
OT Cancellation Note  Patient Details Name: Benjamin Bolton MRN: [id number] DOB: 06-20-1961   Cancelled Treatment:    Reason Eval/Treat Not Completed: Other (comment) (pt with resting HR in 140s bpm, discussed with RN to hold at this time. OT will reattempt as able.Oleta Mouse, OTD OTR/L  10/05/23, 1:47 PM

## 2023-10-05 NOTE — Progress Notes (Signed)
PHARMACY CONSULT NOTE  Pharmacy Consult for Electrolyte Monitoring and Replacement   Recent Labs: Potassium (mmol/L)  Date Value  10/05/2023 4.1   Magnesium (mg/dL)  Date Value  04/54/0981 2.1   Calcium (mg/dL)  Date Value  19/14/7829 8.3 (L)   Albumin (g/dL)  Date Value  56/21/3086 2.0 (L)   Phosphorus (mg/dL)  Date Value  57/84/6962 2.6   Sodium (mmol/L)  Date Value  10/05/2023 139   Assessment: 63 y/o male with h/o COPD, DVT, HLD, GERD, pulmonary nodule and DM who is admitted with RSV, pneumonia and COPD exacerbation. Pharmacy is asked to follow and replace electrolytes while in CCU  Goal of Therapy:  Electrolytes WNL  Plan:  No replacement needed F/u with AM labs.   Ronnald Ramp, PharmD, BCPS 10/05/2023 8:48 AM

## 2023-10-05 NOTE — Progress Notes (Addendum)
Pt refused most of care and medications today, although he did consent to receiving pain medication (oxycodone) earlier today with partial relief. Later pt became emotional, tearful regarding his situation and seemed in some emotional distress. HR also noted to be elevated to 150's, ST with freq PAC's. Pt was medicated with ativan and morphine with good effect, was arousable, but drowsy. Reported that he had less pain and less anxiety. This was discussed with Dr. Karna Christmas and decision was made to place NGT for medications and nutrition after discussion with family as well. Fluid bolus  given and received orders for further antibiotics. This was explained to patient and tube was placed without difficulty around 3 pm. Mitts placed. Placement confirmed placement with KUB, per Dr. Karna Christmas. Pt also given bath, mouthcare and repostioned. Resting comfortably afterwards. HR improved to 120's. Pt noted to be febrile at 100.2. Hand off given to oncoming RN.

## 2023-10-05 NOTE — Consult Note (Signed)
Pharmacy Antibiotic Note  Benjamin Bolton is a 63 y.o. male admitted on 09/10/2023 with pneumonia.  Pharmacy has been consulted for vancomycin dosing.  Plan:  Vancomycin 1.25 g IV x 1 followed by vancomycin 750 mg IV q12h --Calculated AUC: 495, Cmin 14 --Vd 0.72, TBW, Scr 0.8 --Daily Scr per protocol  Height: 6' (182.9 cm) Weight: 58.9 kg (129 lb 13.6 oz) IBW/kg (Calculated) : 77.6  Temp (24hrs), Avg:98.3 F (36.8 C), Min:97.6 F (36.4 C), Max:99.2 F (37.3 C)  Recent Labs  Lab 09/30/23 0507 09/30/23 0518 10/01/23 0502 10/02/23 0443 10/03/23 0503 10/04/23 0431 10/05/23 0513  WBC 14.3*  --  9.1 8.7 12.4* 15.6*  --   CREATININE  --    < > 0.41* 0.41* 0.43* 0.39* 0.48*   < > = values in this interval not displayed.    Estimated Creatinine Clearance: 80.8 mL/min (A) (by C-G formula based on SCr of 0.48 mg/dL (L)).    No Active Allergies  Antimicrobials this admission: See chart, prolonged admit  Dose adjustments this admission: N/A  Microbiology results: RSV (+), MRSA PCR (-), repeat ordered. Prior respiratory culture w/ normal flora.  Thank you for allowing pharmacy to be a part of this patient's care.  Tressie Ellis 10/05/2023 2:12 PM

## 2023-10-05 NOTE — Progress Notes (Signed)
NAME:  Benjamin Bolton, MRN:  [id number], DOB:  1961/01/07, LOS: 25 ADMISSION DATE:  09/10/2023, CONSULTATION DATE: 09/13/2023  Brief Pt Description / Synopsis:  63 y.o. 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     Significant Hospital Events: Including procedures, antibiotic start and stop dates in addition to other pertinent events   1/08: admit to [facility] 1/11: rapid response, transfer to ICU, intubated 1/12: remains vented, sedated, does not follow commands.  Failed SBT  1/13: Overnight due to concerns of aspiration TF's held.  Will repeat SBT today 1/14: Overnight pt had possible seizure activity with rhythmic tremors noted in the mouth/lower face/neck during episode pt unresponsive to pain.  Received 2 mg of versed and 2g keppra bolus with resolution of symptoms.  CT Head negative.  Standing dose of keppra 1g bid.  Neurology consulted.    1/15: On minimal vent settings, unable to tolerate WUA due to increased WOB. Shifting measures given for Hyperkalemia of 5.5 with peaked T waves.  Abdomen distended, KUB without evidence of obstruction, give Lactulose.  Diurese with 40 mg IV Lasix x1. 1/16: Failed WUA, with increased WOB and hypoxic with O2 sats dropping to mid 70's.  Add Seroquel.  Palliative Care consulted to assist with GOC. 1/17: Precedex started for WUA.  Failed SBT due to increased WOB, tachypnea, and hypoxia.  Increase Seroquel to 50 mg BID. Transition Heparin to Elqiuis 1/18: Pt remains mechanically intubated and has failed multiple SBT's.  Planning for family meeting today at bedside with palliative care and PCCM will perform SBT once family arrives at bedside.  Remains sedated with propofol and fentanyl gtts.  Family meeting held code status changed to DNR per family  request.  Pts family trying to decide if they want to proceed with tracheostomy.  Required additional sedation and prn vecuronium overnight due to vent dyssynchrony  1/19: Pt febrile overnight with increased vasopressor requirements levophed gtt @12  mcg/min and febrile tmax 101 degrees F concerning for recurrent sepsis.  Broad spectrum abx started. Pt remains mechanically intubated and unable to successfully liberate from ventilator  1/20 remains on vent, severe rap failure 1/21 remains on vent prolonged exp phase air trapping  on vent 1/22 remains on vent 1/23 CT ABD- Colonic ileus pattern shows significant improvement since the prior radiograph with no further significant gaseous distension of the colon. 1/23 CT chest B/L Lower lobe pneumonia severe emphysema 1/23 CT head-possible cerebral edema 1/23 remains on vent failure to wean 1/24 remains intubated, severe hypoxia, shock 1/25 remains intubated, severe hypoxia, shock 09/29/23- Extubated to BIPAP, I met with family at bedside and reviewed medical plan as well as answered questions.  09/30/23- patient is more awake and able to speak few words asking for water.  10/01/23- patient on HFNC 40/40 able to communicate verbally, will perform SLP and PT/OT eval. Req precedex for aggitation weaning.  CRP trending down and steroids tapering in parallel. 10/02/23- patient weaned to 4L/min, he is eating.  For PT/OT today.  Patient being optimized for TRH transfer.  Weaning from precedex. Patient reports depression but expresses that he wishes to live and wants to continue working towards improvement.  10/03/23- patient was aggitated today and was curing at staff.  He did bedside swallow and passed.  He is on prednisone with mild leukocytosis. Today he  is being optimized for transfer to medical floor.  10/04/23- patient is having weak cough and is not compliant with medications.  He refuses most therapy ordered.  I have called multiple family members and asked  them to come in to encourage him to work with [place]. His wife and sister came in and report he wants to use crack which he normally does routinely.  I've offered medications for withdrawal and called palliative care evaluation due to overall poor prognosis.  10/05/23- patient was more compliant after family came in and encouraged him to work with [place].  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  His leukocytosis and CRP are incrementing despite tapering dose of steroids concerning for infectious PNA.  Cough reflex is poor.  Palliative has seen him and he is DNR/DNI but wants to keep full score of care.  Hypersal and mucomyst neb with RT initiate for cough/airway clearance.     Pertinent  Medical History  DVT on Eliquis COPD on 2L O2 Type II Diabetes Mellitus  GERD HLD Pulmonary Nodule Tobacco Abuse   Micro Data:  1/8: SARS-CoV-2 PCR>>negative 1/8: Blood culture x2>> no growth 1/8: Urine>> multiple species (suggest recollection) 1/9: RVP>> + RSV 1/9: Sputum>> normal respiratory flora 1/9: HIV Screen>> non reactive 1/11: MRSA PCR>> negative 1/19: MRSA PCR>> 1/19: RVP>>  Antimicrobials:  doxy IV bid and zithromax IV   Anti-infectives (From admission, onward)    Start     Dose/Rate Route Frequency Ordered Stop   10/04/23 2200  azithromycin (ZITHROMAX) tablet 250 mg  Status:  Discontinued        250 mg Oral Daily at bedtime 10/04/23 0907 10/04/23 1150   10/04/23 1200  doxycycline (VIBRAMYCIN) 100 mg in sodium chloride 0.9 % 250 mL IVPB        100 mg 125 mL/hr over 120 Minutes Intravenous 2 times daily 10/04/23 1147 10/09/23 0959   09/29/23 2000  azithromycin (ZITHROMAX) 250 mg in dextrose 5 % 125 mL IVPB  Status:  Discontinued        250 mg 127.5 mL/hr over 60 Minutes Intravenous Every 24 hours 09/29/23 1801 10/04/23 0907   09/29/23 1245  azithromycin (ZITHROMAX) tablet 250 mg  Status:  Discontinued        250 mg Per Tube  Daily 09/29/23 1150 09/29/23 1801   09/25/23 1200  linezolid (ZYVOX) IVPB 600 mg  Status:  Discontinued        600 mg 300 mL/hr over 60 Minutes Intravenous Every 12 hours 09/25/23 0918 09/26/23 1206   09/25/23 1015  ceFEPIme (MAXIPIME) 2 g in sodium chloride 0.9 % 100 mL IVPB  Status:  Discontinued        2 g 200 mL/hr over 30 Minutes Intravenous Every 8 hours 09/25/23 0918 09/29/23 1053   09/25/23 0000  ceFAZolin (ANCEF) IVPB 2g/100 mL premix        2 g 200 mL/hr over 30 Minutes Intravenous  Once 09/23/23 1103 09/25/23 0013   09/22/23 0010  vancomycin (VANCOCIN) IVPB 750 mg/150 ml premix  Status:  Discontinued        750 mg 150 mL/hr over 60 Minutes Intravenous Every 12 hours 09/22/23 0004 09/22/23 1039   09/22/23 0000  Vancomycin (VANCOCIN) 750 mg IVPB  Status:  Discontinued        750 mg 150 mL/hr over 60 Minutes Intravenous Every 12 hours 09/21/23 1120 09/21/23 2356   09/22/23 0000  vancomycin (VANCOREADY)  IVPB 750 mg/150 mL  Status:  Discontinued        750 mg 150 mL/hr over 60 Minutes Intravenous Every 12 hours 09/21/23 2356 09/22/23 0004   09/21/23 1300  vancomycin (VANCOCIN) IVPB 1000 mg/200 mL premix        1,000 mg 200 mL/hr over 60 Minutes Intravenous  Once 09/21/23 1120 09/21/23 1900   09/21/23 1215  piperacillin-tazobactam (ZOSYN) IVPB 3.375 g  Status:  Discontinued        3.375 g 12.5 mL/hr over 240 Minutes Intravenous Every 8 hours 09/21/23 1120 09/23/23 1257   09/21/23 1130  vancomycin (VANCOREADY) IVPB 1250 mg/250 mL  Status:  Discontinued        1,250 mg 166.7 mL/hr over 90 Minutes Intravenous  Once 09/21/23 1046 09/21/23 1120   09/21/23 1100  piperacillin-tazobactam (ZOSYN) IVPB 3.375 g  Status:  Discontinued        3.375 g 12.5 mL/hr over 240 Minutes Intravenous  Once 09/21/23 1046 09/21/23 1152   09/13/23 1445  piperacillin-tazobactam (ZOSYN) IVPB 3.375 g        3.375 g 12.5 mL/hr over 240 Minutes Intravenous Every 8 hours 09/13/23 1351 09/17/23 0157   09/10/23  2315  cefTRIAXone (ROCEPHIN) 1 g in sodium chloride 0.9 % 100 mL IVPB  Status:  Discontinued        1 g 200 mL/hr over 30 Minutes Intravenous Every 24 hours 09/10/23 2305 09/13/23 1336          REVIEW OF SYSTEMS   Patient reports anxiety and depression , denies CP, denies dyspnea despite visible shallow breathing, aside from this 10 point ROS is negative   PHYSICAL EXAMINATION:   GENERAL:chronically ill appearing age appropriate EYES: Pupils equal, round, reactive to light.  No scleral icterus.  MOUTH: Moist mucosal membrane. I NECK: Supple.  PULMONARY: Lungs clear to auscultation, +rhonchi, CARDIOVASCULAR: S1 and S2.  Regular rate and rhythm GASTROINTESTINAL: Soft, nontender, -distended. Positive bowel sounds.  MUSCULOSKELETAL: edema.  NEUROLOGIC: mild confusion and aggitation SKIN:normal, warm to touch, Capillary refill delayed  Pulses present bilaterally      Interim History / Subjective:   1.Acute on chronic hypoxemic respiratory failure          RSV pneumonia      - Extubated 09/29/23     Leukocytosis trending up, have broadened coverage for post viral secondary PNA  2. Advanced COPD       Continue abx       S/p decadeon x 1 pre liberation , on on prednisone taper  3. PAF - dcd heparin gtt       Currentnly in SR  - on heprin sq  4. Substance abuse with drug withdrawal syndrome             Patient reports heavy cocaine smoking and EtOH and oxycontin daily  ENDO - ICU hypoglycemic\Hyperglycemia protocol -check FSBS per protocol     GI GI PROPHYLAXIS as indicated NUTRITIONAL STATUS DIET-->TF's as tolerated Constipation protocol as indicated CT ABD ILEUS IMPROVING   ELECTROLYTES -follow labs as needed -replace as needed -pharmacy consultation and following   RESTRICTIVE TRANSFUSION PROTOCOL TRANSFUSION  IF HGB<7  or ACTIVE BLEEDING OR DX of ACUTE CORONARY SYNDROMES     CT ABD ILEUS resolved - placed NGT for nourishment       Best Practice (right  click and "Reselect all SmartList Selections" daily)    Diet/type: TF's DVT prophylaxis heparin Pressure ulcer(s): N/A GI prophylaxis: Pepcid  Lines: RUE PICC Line  and still needed  Foley:  External Catheter  Code Status: DNR  Objective   Blood pressure 116/73, pulse (!) 122, temperature 98.2 F (36.8 C), temperature source Axillary, resp. rate (!) 29, height 6' (1.829 m), weight 58.9 kg, SpO2 95%.        Intake/Output Summary (Last 24 hours) at 10/05/2023 1044 Last data filed at 10/05/2023 0700 Gross per 24 hour  Intake 793.65 ml  Output 575 ml  Net 218.65 ml   Filed Weights   10/03/23 0500 10/04/23 0500 10/05/23 0500  Weight: 59.2 kg 57.9 kg 58.9 kg   Critical care provider statement:   Total critical care time: 33 minutes   Performed by: Karna Christmas MD   Critical care time was exclusive of separately billable procedures and treating other patients.   Critical care was necessary to treat or prevent imminent or life-threatening deterioration.   Critical care was time spent personally by me on the following activities: development of treatment plan with patient and/or surrogate as well as nursing, discussions with consultants, evaluation of patient's response to treatment, examination of patient, obtaining history from patient or surrogate, ordering and performing treatments and interventions, ordering and review of laboratory studies, ordering and review of radiographic studies, pulse oximetry and re-evaluation of patient's condition.    Vida Rigger, M.D.  Pulmonary & Critical Care Medicine

## 2023-10-05 NOTE — Progress Notes (Signed)
Daily Progress Note   Patient Name: Benjamin Bolton       Date: 10/05/2023 DOB: 11-06-1960  Age: 63 y.o. MRN#: [id number] Attending Physician: Vida Rigger, MD Primary Care Physician: [facility] Admit Date: 09/10/2023  Reason for Consultation/Follow-up: Establishing goals of care  HPI/Brief Hospital Review: 63 y.o. 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    1/11, rapid response, transferred to ICU, patient was intubated and sedated..  1/12, remains vented, sedated, does not follow commands.  Failed SBT  1/13, overnight due to concerns of aspiration TF's held.  Will repeat SBT today 1/14, overnight pt had possible seizure activity with rhythmic tremors noted in the mouth/lower face/neck during episode pt unresponsive to pain.  Received 2 mg of versed and 2g keppra bolus with resolution of symptoms.  CT Head negative.  Standing dose of keppra 1g bid.  Neurology consulted.    1/15, on minimal vent settings, unable to tolerate WUA due to increased WOB. Shifting measures given for Hyperkalemia of 5.5 with peaked T waves.  Abdomen distended, KUB without evidence of obstruction, give Lactulose.  Diurese with 40 mg IV Lasix x1. 1/16, Failed WUA, with increased WOB and hypoxic with O2 sats dropping to mid 70's.  Add Seroquel.   1/17-1/19 failed WUA/SBT 1/19 concern for recurrent sepsis due to fevers developing overnight and increased vasopressor support 09/29/23- Extubated to BIPAP, I met with family at bedside and reviewed medical plan as well as answered questions.  09/30/23-  patient is more awake and able to speak few words asking for water.  10/01/23- patient on HFNC 40/40 able to communicate verbally, will perform SLP and PT/OT eval. Req precedex for aggitation weaning.  CRP trending down and steroids tapering in parallel.   Requested to engage by CCM team on 2/1 and 2/2-considering NGT placement for nutrition and medication administration, offering to patient/family versus comfort care   Palliative Medicine consulted for assisting with goals of care conversations.  Subjective: Extensive chart review has been completed prior to meeting patient including labs, vital signs, imaging, progress notes, orders, and available advanced directive documents from current and previous encounters.  Visited with [name] at his bedside. He is awake, drowsy, unable to answer orientation questions, can respond to simple "yes or no" questions, continues to reiterate he is "ready to get out of here." [name] at this time is not able to engage in GOC conversations.  Called and spoke with wife-Benjamin Bolton (contact information has been uploaded into chart) requested she and his sisters visit at bedside for GOC discussions. We discussed need for GOC conversation related to ongoing poor PO intake and inability to administer medications. Received return call back from Benjamin Bolton, she and sisters are unable to visit today. They discussed amongst themselves and wish to proceed with NGT placement. Notified CCM team and nursing staff.  Returned to bedside later in day, NGT successfully placed.  PMT to continue to follow for ongoing needs and support.  Care plan was discussed with CCM team and nursing staff.  Thank you for allowing the Palliative Medicine Team to assist in the care of this patient.  Total time:  35 minutes  Time spent includes: Detailed review of medical records (labs, imaging, vital signs), medically appropriate exam (mental status, respiratory, cardiac, skin), discussed with  treatment team, counseling and educating patient, family and staff, documenting clinical information, medication management and coordination of care.  Leeanne Deed, DNP, AGNP-C Palliative Medicine   Please contact Palliative Medicine Team phone at [phone] for questions and concerns.

## 2023-10-06 ENCOUNTER — Inpatient Hospital Stay: Payer: 59

## 2023-10-06 DIAGNOSIS — J9621 Acute and chronic respiratory failure with hypoxia: Secondary | ICD-10-CM | POA: Diagnosis not present

## 2023-10-06 DIAGNOSIS — E43 Unspecified severe protein-calorie malnutrition: Secondary | ICD-10-CM | POA: Diagnosis not present

## 2023-10-06 DIAGNOSIS — I824Y9 Acute embolism and thrombosis of unspecified deep veins of unspecified proximal lower extremity: Secondary | ICD-10-CM | POA: Diagnosis not present

## 2023-10-06 DIAGNOSIS — J441 Chronic obstructive pulmonary disease with (acute) exacerbation: Secondary | ICD-10-CM | POA: Diagnosis not present

## 2023-10-06 DIAGNOSIS — Z515 Encounter for palliative care: Secondary | ICD-10-CM | POA: Diagnosis not present

## 2023-10-06 LAB — CBC
HCT: 26 % — ABNORMAL LOW (ref 39.0–52.0)
Hemoglobin: 8.2 g/dL — ABNORMAL LOW (ref 13.0–17.0)
MCH: 30.5 pg (ref 26.0–34.0)
MCHC: 31.5 g/dL (ref 30.0–36.0)
MCV: 96.7 fL (ref 80.0–100.0)
Platelets: 335 10*3/uL (ref 150–400)
RBC: 2.69 MIL/uL — ABNORMAL LOW (ref 4.22–5.81)
RDW: 14.9 % (ref 11.5–15.5)
WBC: 13.8 10*3/uL — ABNORMAL HIGH (ref 4.0–10.5)
nRBC: 0 % (ref 0.0–0.2)

## 2023-10-06 LAB — GLUCOSE, CAPILLARY
Glucose-Capillary: 106 mg/dL — ABNORMAL HIGH (ref 70–99)
Glucose-Capillary: 108 mg/dL — ABNORMAL HIGH (ref 70–99)
Glucose-Capillary: 116 mg/dL — ABNORMAL HIGH (ref 70–99)
Glucose-Capillary: 118 mg/dL — ABNORMAL HIGH (ref 70–99)
Glucose-Capillary: 120 mg/dL — ABNORMAL HIGH (ref 70–99)
Glucose-Capillary: 124 mg/dL — ABNORMAL HIGH (ref 70–99)
Glucose-Capillary: 125 mg/dL — ABNORMAL HIGH (ref 70–99)
Glucose-Capillary: 128 mg/dL — ABNORMAL HIGH (ref 70–99)
Glucose-Capillary: 141 mg/dL — ABNORMAL HIGH (ref 70–99)
Glucose-Capillary: 144 mg/dL — ABNORMAL HIGH (ref 70–99)
Glucose-Capillary: 86 mg/dL (ref 70–99)
Glucose-Capillary: 87 mg/dL (ref 70–99)
Glucose-Capillary: 90 mg/dL (ref 70–99)
Glucose-Capillary: 92 mg/dL (ref 70–99)

## 2023-10-06 LAB — RENAL FUNCTION PANEL
Albumin: 1.6 g/dL — ABNORMAL LOW (ref 3.5–5.0)
Anion gap: 10 (ref 5–15)
BUN: 13 mg/dL (ref 8–23)
CO2: 26 mmol/L (ref 22–32)
Calcium: 7.6 mg/dL — ABNORMAL LOW (ref 8.9–10.3)
Chloride: 102 mmol/L (ref 98–111)
Creatinine, Ser: 0.36 mg/dL — ABNORMAL LOW (ref 0.61–1.24)
GFR, Estimated: >60 mL/min (ref >60)
Glucose, Bld: 93 mg/dL (ref 70–99)
Phosphorus: 2.2 mg/dL — ABNORMAL LOW (ref 2.5–4.6)
Potassium: 3.5 mmol/L (ref 3.5–5.1)
Sodium: 138 mmol/L (ref 135–145)

## 2023-10-06 LAB — BASIC METABOLIC PANEL
Anion gap: 13 (ref 5–15)
BUN: 12 mg/dL (ref 8–23)
CO2: 25 mmol/L (ref 22–32)
Calcium: 7.7 mg/dL — ABNORMAL LOW (ref 8.9–10.3)
Chloride: 100 mmol/L (ref 98–111)
Creatinine, Ser: 0.4 mg/dL — ABNORMAL LOW (ref 0.61–1.24)
GFR, Estimated: >60 mL/min (ref >60)
Glucose, Bld: 134 mg/dL — ABNORMAL HIGH (ref 70–99)
Potassium: 4.4 mmol/L (ref 3.5–5.1)
Sodium: 138 mmol/L (ref 135–145)

## 2023-10-06 LAB — PHOSPHORUS: Phosphorus: 3.4 mg/dL (ref 2.5–4.6)

## 2023-10-06 LAB — MAGNESIUM
Magnesium: 1.4 mg/dL — ABNORMAL LOW (ref 1.7–2.4)
Magnesium: 2.6 mg/dL — ABNORMAL HIGH (ref 1.7–2.4)

## 2023-10-06 LAB — C-REACTIVE PROTEIN: CRP: 13.4 mg/dL — ABNORMAL HIGH (ref <1.0)

## 2023-10-06 LAB — MRSA NEXT GEN BY PCR, NASAL: MRSA by PCR Next Gen: NOT DETECTED

## 2023-10-06 MED ORDER — VITAMIN C 500 MG PO TABS
500.0000 mg | ORAL_TABLET | Freq: Two times a day (BID) | ORAL | Status: DC
Start: 2023-10-06 — End: 2023-10-13
  Administered 2023-10-06 – 2023-10-12 (×13): 500 mg
  Filled 2023-10-06 (×13): qty 1

## 2023-10-06 MED ORDER — THIAMINE MONONITRATE 100 MG PO TABS
100.0000 mg | ORAL_TABLET | Freq: Every day | ORAL | Status: AC
  Administered 2023-10-06 – 2023-10-12 (×7): 100 mg
  Filled 2023-10-06 (×7): qty 1

## 2023-10-06 MED ORDER — APIXABAN 5 MG PO TABS
5.0000 mg | ORAL_TABLET | Freq: Two times a day (BID) | ORAL | Status: DC
Start: 2023-10-06 — End: 2023-10-13
  Administered 2023-10-06 – 2023-10-12 (×13): 5 mg
  Filled 2023-10-06 (×13): qty 1

## 2023-10-06 MED ORDER — OSMOLITE 1.5 CAL PO LIQD
1000.0000 mL | ORAL | Status: DC
Start: 2023-10-06 — End: 2023-10-13
  Administered 2023-10-06 – 2023-10-11 (×6): 1000 mL

## 2023-10-06 MED ORDER — PREDNISONE 50 MG PO TABS
25.0000 mg | ORAL_TABLET | Freq: Every day | ORAL | Status: AC
  Administered 2023-10-08: 25 mg via ORAL
  Filled 2023-10-06: qty 1

## 2023-10-06 MED ORDER — FREE WATER
30.0000 mL | Status: DC
Start: 2023-10-06 — End: 2023-10-17
  Administered 2023-10-06 – 2023-10-16 (×58): 30 mL

## 2023-10-06 MED ORDER — PREDNISONE 20 MG PO TABS
30.0000 mg | ORAL_TABLET | Freq: Every day | ORAL | Status: AC
  Administered 2023-10-07: 30 mg via ORAL
  Filled 2023-10-06: qty 1

## 2023-10-06 MED ORDER — IPRATROPIUM-ALBUTEROL 0.5-2.5 (3) MG/3ML IN SOLN: 3.0000 mL | Freq: Four times a day (QID) | RESPIRATORY_TRACT | Status: DC

## 2023-10-06 MED ORDER — ADULT MULTIVITAMIN W/MINERALS CH
1.0000 | ORAL_TABLET | Freq: Every day | ORAL | Status: DC
  Administered 2023-10-07 – 2023-10-12 (×6): 1
  Filled 2023-10-06 (×6): qty 1

## 2023-10-06 MED ORDER — POTASSIUM & SODIUM PHOSPHATES 280-160-250 MG PO PACK
1.0000 | PACK | Freq: Three times a day (TID) | ORAL | Status: DC
  Administered 2023-10-06 (×3): 1
  Filled 2023-10-06 (×3): qty 1

## 2023-10-06 MED ORDER — POTASSIUM CHLORIDE 20 MEQ PO PACK
40.0000 meq | PACK | Freq: Once | ORAL | Status: AC
  Administered 2023-10-06: 40 meq
  Filled 2023-10-06: qty 2

## 2023-10-06 MED ORDER — MAGNESIUM SULFATE 4 GM/100ML IV SOLN
4.0000 g | Freq: Once | INTRAVENOUS | Status: AC
  Administered 2023-10-06: 4 g via INTRAVENOUS
  Filled 2023-10-06: qty 100

## 2023-10-06 MED ORDER — JUVEN PO PACK
1.0000 | PACK | Freq: Two times a day (BID) | ORAL | Status: DC
  Administered 2023-10-07 – 2023-10-16 (×17): 1

## 2023-10-06 MED ORDER — ACETAMINOPHEN 325 MG PO TABS
650.0000 mg | ORAL_TABLET | Freq: Four times a day (QID) | ORAL | Status: DC | PRN
  Administered 2023-10-08 – 2023-10-13 (×14): 650 mg
  Filled 2023-10-06 (×14): qty 2

## 2023-10-06 MED ORDER — PREDNISONE 20 MG PO TABS
20.0000 mg | ORAL_TABLET | Freq: Every day | ORAL | Status: DC
  Administered 2023-10-09 – 2023-10-12 (×4): 20 mg via ORAL
  Filled 2023-10-06 (×4): qty 1

## 2023-10-06 MED ORDER — PROSOURCE TF20 ENFIT COMPATIBL EN LIQD
60.0000 mL | Freq: Every day | ENTERAL | Status: DC
  Administered 2023-10-07 – 2023-10-15 (×9): 60 mL
  Filled 2023-10-06 (×9): qty 60

## 2023-10-06 MED ORDER — IPRATROPIUM-ALBUTEROL 0.5-2.5 (3) MG/3ML IN SOLN
3.0000 mL | Freq: Four times a day (QID) | RESPIRATORY_TRACT | Status: DC
Start: 2023-10-06 — End: 2023-10-06

## 2023-10-06 MED ORDER — IPRATROPIUM-ALBUTEROL 0.5-2.5 (3) MG/3ML IN SOLN
3.0000 mL | Freq: Four times a day (QID) | RESPIRATORY_TRACT | Status: DC
Start: 2023-10-06 — End: 2023-10-06
  Administered 2023-10-06 (×2): 3 mL via RESPIRATORY_TRACT
  Filled 2023-10-06 (×2): qty 3

## 2023-10-06 NOTE — Plan of Care (Signed)
  Problem: Education: Goal: Knowledge of General Education information will improve Description: Including pain rating scale, medication(s)/side effects and non-pharmacologic comfort measures Outcome: Progressing   Problem: Health Behavior/Discharge Planning: Goal: Ability to manage health-related needs will improve Outcome: Progressing   Problem: Clinical Measurements: Goal: Ability to maintain clinical measurements within normal limits will improve Outcome: Progressing Goal: Will remain free from infection Outcome: Progressing Goal: Diagnostic test results will improve Outcome: Progressing Goal: Respiratory complications will improve Outcome: Progressing Goal: Cardiovascular complication will be avoided Outcome: Progressing   Problem: Activity: Goal: Risk for activity intolerance will decrease Outcome: Progressing   Problem: Nutrition: Goal: Adequate nutrition will be maintained Outcome: Progressing   Problem: Coping: Goal: Level of anxiety will decrease Outcome: Progressing   Problem: Elimination: Goal: Will not experience complications related to bowel motility Outcome: Progressing Goal: Will not experience complications related to urinary retention Outcome: Progressing   Problem: Pain Management: Goal: General experience of comfort will improve Outcome: Progressing   Problem: Safety: Goal: Ability to remain free from injury will improve Outcome: Progressing   Problem: Skin Integrity: Goal: Risk for impaired skin integrity will decrease Outcome: Progressing   Problem: Education: Goal: Knowledge of disease or condition will improve Outcome: Progressing Goal: Knowledge of the prescribed therapeutic regimen will improve Outcome: Progressing   Problem: Activity: Goal: Ability to tolerate increased activity will improve Outcome: Progressing Goal: Will verbalize the importance of balancing activity with adequate rest periods Outcome: Progressing   Problem:  Respiratory: Goal: Ability to maintain a clear airway will improve Outcome: Progressing Goal: Levels of oxygenation will improve Outcome: Progressing Goal: Ability to maintain adequate ventilation will improve Outcome: Progressing   Problem: Education: Goal: Ability to describe self-care measures that may prevent or decrease complications (Diabetes Survival Skills Education) will improve Outcome: Progressing   Problem: Coping: Goal: Ability to adjust to condition or change in health will improve Outcome: Progressing   Problem: Fluid Volume: Goal: Ability to maintain a balanced intake and output will improve Outcome: Progressing   Problem: Health Behavior/Discharge Planning: Goal: Ability to identify and utilize available resources and services will improve Outcome: Progressing Goal: Ability to manage health-related needs will improve Outcome: Progressing   Problem: Metabolic: Goal: Ability to maintain appropriate glucose levels will improve Outcome: Progressing   Problem: Nutritional: Goal: Maintenance of adequate nutrition will improve Outcome: Progressing Goal: Progress toward achieving an optimal weight will improve Outcome: Progressing   Problem: Skin Integrity: Goal: Risk for impaired skin integrity will decrease Outcome: Progressing   Problem: Tissue Perfusion: Goal: Adequacy of tissue perfusion will improve Outcome: Progressing   Problem: Activity: Goal: Ability to tolerate increased activity will improve Outcome: Progressing   Problem: Respiratory: Goal: Ability to maintain a clear airway and adequate ventilation will improve Outcome: Progressing   Problem: Role Relationship: Goal: Method of communication will improve Outcome: Progressing

## 2023-10-06 NOTE — Progress Notes (Signed)
Palliative Care Progress Note, Assessment & Plan   Patient Name: Benjamin Bolton       Date: 10/06/2023 DOB: 1961-05-25  Age: 63 y.o. MRN#: [id number] Attending Physician: Raechel Chute, MD Primary Care Physician: [facility] Admit Date: 09/10/2023  Subjective: Patient is lying in bed in no apparent distress.  He is awake and acknowledges my presence.  NG tube to nare and mitts remain in place.  No family or friends present during my visit.  HPI: 63 y.o. 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    1/11, rapid response, transferred to ICU, patient was intubated and sedated..  1/12, remains vented, sedated, does not follow commands.  Failed SBT  1/13, overnight due to concerns of aspiration TF's held.  Will repeat SBT today 1/14, overnight pt had possible seizure activity with rhythmic tremors noted in the mouth/lower face/neck during episode pt unresponsive to pain.  Received 2 mg of versed and 2g keppra bolus with resolution of symptoms.  CT Head negative.  Standing dose of keppra 1g bid.  Neurology consulted.    1/15, on minimal vent settings, unable to tolerate WUA due to increased WOB. Shifting measures given for Hyperkalemia of 5.5 with peaked T waves.  Abdomen distended, KUB without evidence of obstruction, give Lactulose.  Diurese with 40 mg IV Lasix x1. 1/16, Failed WUA, with increased WOB and hypoxic with O2 sats dropping to mid 70's.  Add Seroquel.   1/17-1/19 failed WUA/SBT 1/19 concern for recurrent sepsis due to fevers developing overnight and increased vasopressor  support 09/29/23- Extubated to BIPAP, I met with family at bedside and reviewed medical plan as well as answered questions.  09/30/23- patient is more awake and able to speak few words asking for water.  10/01/23- patient on HFNC 40/40 able to communicate verbally, will perform SLP and PT/OT eval. Req precedex for aggitation weaning.  CRP trending down and steroids tapering in parallel.   Requested for PMT to re-engage by CCM team on 2/1 and 2/2  As per discussion with family, NGT placed for nutrition and medication administration.   Palliative Medicine consulted for assisting with goals of care conversations.  Summary of counseling/coordination of care: Extensive chart review completed prior to meeting patient including labs, vital signs, imaging, progress notes, orders, and available advanced directive documents from current and previous encounters.   After reviewing the patient's chart and assessing the patient at bedside, I spoke with patient in regards to symptom management and goals of care.   Patient is minimally interactive.  He says that he has "missed my appointment to get my medicine".  I shared and that he is in the hospital, receiving all appropriate medications, and that he has not missed any appointments.  He nodded his head and appeared to be relieved.    Patient is unable to participate in goals of care medical decision making independently at this time.  However, he was able to participate in a symptom assessment.  He denies pain, discomfort, N/V/D, chest pain, headache, or  other acute issues at this time.  No adjustment to [facility] needed.  Counseled patient on use of NG tube to provide medication and nutrition, mitts to protect him from pulling out lines/drains, and antibiotics pneumonia related to his recent RSV infection.  Discussed that plan of care remains to treat the treatable in hopes of continued improvement.  Patient nodded his head yes in agreement.  He had no further  questions or concerns at this time.  After speaking with the patient, I spoke with his HCPOA/sister Benjamin Bolton over the phone.  Brief medical update given.  Benjamin Bolton is grateful for PMT support.  She shares that she remains hopeful that patient will continue to show signs of improvement.  Discussed patient's extended hospitalization is contributing to a poor overall functional status.  Discussed PT and OT continue to work with patient though he has a long way to go to improve his mobility.  She shares she is grateful for any positive step in the right direction.  Benjamin Bolton does not plan on visiting tomorrow as she has her own eye doctor appointment.  I assured her that medical team and PMT will continue to provide updates and remain available to her throughout patient's hospitalization.  Physical Exam Vitals reviewed.  Constitutional:      General: He is not in acute distress.    Appearance: He is normal weight.  HENT:     Head: Normocephalic.  Cardiovascular:     Rate and Rhythm: Normal rate.  Pulmonary:     Breath sounds: Examination of the right-lower field reveals decreased breath sounds. Examination of the left-lower field reveals decreased breath sounds. Decreased breath sounds present.  Musculoskeletal:     Comments: Generalized weakness  Skin:    General: Skin is warm and dry.  Neurological:     Mental Status: He is alert.  Psychiatric:        Mood and Affect: Mood is not anxious.        Behavior: Behavior normal. Behavior is not agitated.             Total Time 35 minutes   Time spent includes: Detailed review of medical records (labs, imaging, vital signs), medically appropriate exam (mental status, respiratory, cardiac, skin), discussed with treatment team, counseling and educating patient, family and staff, documenting clinical information, medication management and coordination of care.  Samara Deist L. Bonita Quin, DNP, FNP-BC Palliative Medicine Team

## 2023-10-06 NOTE — Progress Notes (Signed)
PHARMACY CONSULT NOTE  Pharmacy Consult for Electrolyte Monitoring and Replacement   Recent Labs: Potassium (mmol/L)  Date Value  10/06/2023 3.5   Magnesium (mg/dL)  Date Value  40/98/1191 1.4 (L)   Calcium (mg/dL)  Date Value  47/82/9562 7.6 (L)   Albumin (g/dL)  Date Value  13/04/6577 1.6 (L)   Phosphorus (mg/dL)  Date Value  46/96/2952 2.2 (L)   Sodium (mmol/L)  Date Value  10/06/2023 138   Assessment: 63 y/o male with h/o COPD, DVT, HLD, GERD, pulmonary nodule and DM who is admitted with RSV, pneumonia and COPD exacerbation. Pharmacy is asked to follow and replace electrolytes while in CCU.  NGT placed 2/2 and pt started on tube feeds. He is re-feeding  Goal of Therapy:  Electrolytes WNL  Plan:  --K 3.5, Kcl 40 mEq per tube x 1 dose --Phos 2.2, Phos-Nak 1 packet per tube x 4 doses --Mg 1.4, magnesium sulfate 4 g IV x 1 --Re-check electrolytes in AM tomorrow  Tressie Ellis 10/06/2023 8:26 AM

## 2023-10-06 NOTE — Progress Notes (Signed)
Occupational Therapy Treatment Patient Details Name: Jaelynn Currier MRN: [id number] DOB: 01-Jul-1961 Today's Date: 10/06/2023   History of present illness 63 y.o. 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   OT comments  Pt is supine in bed on arrival. Easily arousable and agreeable to OT session. He denies pain. Pt had received breathing treatment with percussion 30 mins prior to OT session. Pt wishing to get out of bed and get his keys to go to [facility]. Able to state his name and DOB. Pt performed bed mobility with Mod/Max A. Pt needing Min to Max A to maintain seated balance at EOB. He needed Max A to perform oral care and face washing while seated EOB. R posterior lateral lean noted with sitting. Max A to reposition to [facility]. 3/5 grip strength to R hand, but does not utilize it during session. L head turn preference with cueing to bring it to neutral/midline. Would benefit from +2 for standing/progressing mobility. Pt returned to bed with all needs in place and will cont to require skilled acute OT services to maximize his safety and IND to return to PLOF.       If plan is discharge home, recommend the following:  Two people to help with walking and/or transfers;A lot of help with bathing/dressing/bathroom;Help with stairs or ramp for entrance   Equipment Recommendations  Other (comment) (defer)    Recommendations for Other Services      Precautions / Restrictions Precautions Precautions: Fall Restrictions Weight Bearing Restrictions Per Provider Order: No       Mobility Bed Mobility Overal bed mobility: Needs Assistance Bed Mobility: Sit to Supine, Supine to Sit     Supine to sit: Mod assist Sit to supine: Max assist   General bed mobility comments:  BLEs hanging off bed on entry; needed Mod A for trunk to reach EOB; Max A to return; Min to Max A for seated balance at EOB to perform grooming tasks; Max A to scoot to [facility] using chux pad    Transfers                         Balance Overall balance assessment: Needs assistance Sitting-balance support: Feet supported Sitting balance-Leahy Scale: Poor Sitting balance - Comments: Min to Max A to maintain seated balance with R posteriolateral lean; L head turn preference with cueing to bring to midline/neutral Postural control: Posterior lean, Right lateral lean                                 ADL either performed or assessed with clinical judgement   ADL Overall ADL's : Needs assistance/impaired     Grooming: Oral care;Wash/dry face;Maximal assistance;Sitting Grooming Details (indicate cue type and reason): max A to perform while seated EOB                                    Extremity/Trunk Assessment              Vision       Perception     Praxis      Cognition Arousal: Alert, Lethargic Behavior During Therapy: Flat affect  Overall Cognitive Status: Impaired/Different from baseline Area of Impairment: Safety/judgement, Following commands, Orientation                 Orientation Level: Disoriented to, Place, Time     Following Commands: Follows one step commands with increased time Safety/Judgement: Decreased awareness of safety, Decreased awareness of deficits     General Comments: very low tone, difficut to understand at times. needs encouragement, however got seemingly annoyed with questions; per nurse thought it was 1995        Exercises      Shoulder Instructions       General Comments VSS with HR up 111-125    Pertinent Vitals/ Pain       Pain Assessment Pain Assessment: No/denies pain  Home Living                                          Prior Functioning/Environment               Frequency  Min 1X/week        Progress Toward Goals  OT Goals(current goals can now be found in the care plan section)  Progress towards OT goals: Progressing toward goals  Acute Rehab OT Goals Patient Stated Goal: get out of bed OT Goal Formulation: With patient Time For Goal Achievement: 10/15/23 Potential to Achieve Goals: Good  Plan      Co-evaluation                 AM-PAC OT "6 Clicks" Daily Activity     Outcome Measure   Help from another person eating meals?: A Lot Help from another person taking care of personal grooming?: A Lot Help from another person toileting, which includes using toliet, bedpan, or urinal?: A Lot Help from another person bathing (including washing, rinsing, drying)?: A Lot Help from another person to put on and taking off regular upper body clothing?: A Lot Help from another person to put on and taking off regular lower body clothing?: A Lot 6 Click Score: 12    End of Session Equipment Utilized During Treatment: Oxygen  OT Visit Diagnosis: Other abnormalities of gait and mobility (R26.89);Muscle weakness (generalized) (M62.81)   Activity Tolerance Patient limited by lethargy   Patient Left in bed;with call bell/phone within reach;with bed alarm set   Nurse Communication Mobility status        Time: 4098-1191 OT Time Calculation (min): 23 min  Charges: OT General Charges $OT Visit: 1 Visit OT Treatments $Self Care/Home Management : 8-22 mins $Therapeutic Activity: 8-22 mins  Tyee Vandevoorde, OTR/L  10/06/23, 4:11 PM   Hatley Henegar E Alexandros Ewan 10/06/2023, 4:08 PM

## 2023-10-06 NOTE — Progress Notes (Signed)
Physical Therapy Treatment Patient Details Name: Benjamin Bolton MRN: [id number] DOB: Jan 01, 1961 Today's Date: 10/06/2023   History of Present Illness 63 y.o. 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    PT Comments  Patient is agreeable to PT session. He is confused but cooperative during session. The patient continues to require assistance for bed mobility. Sitting balance is poor with external support required. Patient is fatigued with minimal activity today. Total assistance for lateral scooting to the right. Recommend to continue PT to maximize independence and facilitate return to prior level of function.    If plan is discharge home, recommend the following: A lot of help with walking and/or transfers;A lot of help with bathing/dressing/bathroom;Assist for transportation;Assistance with cooking/housework;Help with stairs or ramp for entrance   Can travel by private vehicle        Equipment Recommendations   (to  be determined at next level of care)    Recommendations for Other Services       Precautions / Restrictions Precautions Precautions: Fall Restrictions Weight Bearing Restrictions Per Provider Order: No     Mobility  Bed Mobility Overal bed mobility: Needs Assistance Bed Mobility: Sit to Supine, Supine to Sit     Supine to sit: Mod assist Sit to supine: Max assist   General bed mobility comments: assistance for trunk and BLE support. cues for sequencing and technique    Transfers Overall transfer level: Needs assistance   Transfers: Bed to chair/wheelchair/BSC            Lateral/Scoot Transfers: Total assist General transfer comment: patient declined attempting to stand. total assistance required for incremental scooting to the  right in sitting position. cues for technique.    Ambulation/Gait                   Stairs             Wheelchair Mobility     Tilt Bed    Modified Rankin (Stroke Patients Only)       Balance Overall balance assessment: Needs assistance Sitting-balance support: Feet supported Sitting balance-Leahy Scale: Poor Sitting balance - Comments: MinA- Mod A required to maintain sitting balance with brief periods of CGA. posterior lean. left head turn preference but is able to achieve midline head/neck position with cues Postural control: Posterior lean                                  Cognition Arousal: Alert, Lethargic Behavior During Therapy: Flat affect Overall Cognitive Status: Impaired/Different from baseline Area of Impairment: Safety/judgement, Following commands, Orientation                 Orientation Level: Disoriented to, Place, Time     Following Commands: Follows one step commands with increased time Safety/Judgement: Decreased awareness of safety, Decreased awareness of deficits     General Comments: very low tone, difficut to understand at times. needs encouragement.        Exercises      General Comments General comments (skin integrity, edema, etc.): heart rate up to 122 briefly. no significant change in vitals otherwise      Pertinent Vitals/Pain Pain Assessment Pain Assessment: No/denies pain    Home Living  Prior Function            PT Goals (current goals can now be found in the care plan section) Acute Rehab PT Goals Patient Stated Goal: get stronger PT Goal Formulation: With patient Time For Goal Achievement: 10/14/23 Potential to Achieve Goals: Fair Progress towards PT goals: Progressing toward goals    Frequency    Min 1X/week      PT Plan      Co-evaluation              AM-PAC PT "6 Clicks" Mobility   Outcome Measure  Help needed turning from  your back to your side while in a flat bed without using bedrails?: A Little Help needed moving from lying on your back to sitting on the side of a flat bed without using bedrails?: A Lot Help needed moving to and from a bed to a chair (including a wheelchair)?: A Lot Help needed standing up from a chair using your arms (e.g., wheelchair or bedside chair)?: A Lot Help needed to walk in hospital room?: Total Help needed climbing 3-5 steps with a railing? : Total 6 Click Score: 11    End of Session   Activity Tolerance: Patient limited by fatigue Patient left: in bed;with call bell/phone within reach;with bed alarm set Nurse Communication: Mobility status PT Visit Diagnosis: Unsteadiness on feet (R26.81);Muscle weakness (generalized) (M62.81);Difficulty in walking, not elsewhere classified (R26.2)     Time: 4098-1191 PT Time Calculation (min) (ACUTE ONLY): 15 min  Charges:    $Therapeutic Activity: 8-22 mins PT General Charges $$ ACUTE PT VISIT: 1 Visit                     Donna Bernard, PT, MPT    Ina Homes 10/06/2023, 10:41 AM

## 2023-10-06 NOTE — Progress Notes (Signed)
Nutrition Follow Up Note   DOCUMENTATION CODES:   Severe malnutrition in context of acute illness/injury  INTERVENTION:   Osmolite 1.5@60ml /hr- Initiate at 41ml/hr and increase by 2ml/hr q 8 hours until goal rate is reached.   ProSource TF 20- Give 60ml daily via tube, each supplement provides 80kcal and 20g of protein.   Free water flushes 30ml q4 hours to maintain tube patency   Regimen provides 2240kcal/day, 110g/day protein and 1226ml/day of free water.   Vitamin C 500mg  BID via tube  Thiamine 100mg  dialy via tube x 7 days   Juven Fruit Punch BID via tube, each serving provides 95kcal and 2.5g of protein (amino acids glutamine and arginine)  Pt at high refeed risk; recommend monitor potassium, magnesium and phosphorus labs daily until stable  Daily weights   NUTRITION DIAGNOSIS:   Severe Malnutrition related to acute illness as evidenced by moderate fat depletion, severe fat depletion, moderate muscle depletion, severe muscle depletion. -new diagnosis   GOAL:   Patient will meet greater than or equal to 90% of their needs -not met   MONITOR:   Diet advancement, Labs, Weight trends, TF tolerance, Skin, I & O's  ASSESSMENT:   63 y/o male with h/o COPD, DVT, HLD, GERD, pulmonary nodule and DM who is admitted with RSV, pneumonia and COPD exacerbation.  Met with pt in room today. Pt more somnolent today than usual. Pt with confusion and reporting that he needs to go [place] the post office. Pt refusing meals and medications. NGT placed this morning but needs advancement; RN aware. Will plan to initiate tube feeds today. Pt is at high refeed risk. Per chart, pt is down ~6lbs since admission. Pt with increased fat and muscle depletions and meets criteria for severe malnutrition today.    Medications reviewed and include: vitamin C, insulin, protonix, PHOS-NAK, prednisone, zosyn   Labs reviewed: K 3.5 wnl, creat 0.36(L), P 2.2(L), Mg 1.4(L) Wbc- 13.8(H), Hgb 8.2(L), Hct  26.0(L) Cbgs- 92, 87, 90 x 24 hrs   UOP-   Nutrition Focused Physical Exam:  Flowsheet Row Most Recent Value  Orbital Region Moderate depletion  Upper Arm Region Severe depletion  Thoracic and Lumbar Region Moderate depletion  Buccal Region Moderate depletion  Temple Region Moderate depletion  Clavicle Bone Region Moderate depletion  Clavicle and Acromion Bone Region Moderate depletion  Scapular Bone Region Moderate depletion  Dorsal Hand Moderate depletion  Patellar Region Severe depletion  Anterior Thigh Region Severe depletion  Posterior Calf Region Severe depletion  Edema (RD Assessment) None  Hair Reviewed  Eyes Reviewed  Mouth Reviewed  Skin Reviewed  Nails Reviewed   Diet Order:   Diet Order             DIET - DYS 1 Room service appropriate? Yes with Assist; Fluid consistency: Thin  Diet effective now                  EDUCATION NEEDS:   Education needs have been addressed  Skin:  Skin Assessment: Reviewed RN Assessment (Stage II sacrum)  Last BM:  2/2  Height:   Ht Readings from Last 1 Encounters:  09/10/23 6' (1.829 m)    Weight:   Wt Readings from Last 1 Encounters:  10/06/23 57.9 kg    Ideal Body Weight:  80.9 kg  BMI:  Body mass index is 17.31 kg/m.  Estimated Nutritional Needs:   Kcal:  1900-2200kcal/day  Protein:  95-110g/day  Fluid:  1.8-2.1L/day  Betsey Holiday MS, RD,  LDN If unable to be reached, please send secure chat to "RD inpatient" available from 8:00a-4:00p daily

## 2023-10-06 NOTE — Progress Notes (Signed)
OT Cancellation Note  Patient Details Name: Benjamin Bolton MRN: [id number] DOB: 22-Apr-1961   Cancelled Treatment:    Reason Eval/Treat Not Completed: Other (comment) Respiratory walking out of room stating pt just started breathing treatment with chest percussion that would last about 20 mins. OT to re-attempt at later date/time as pt available.   Constance Goltz 10/06/2023, 2:58 PM

## 2023-10-06 NOTE — TOC Progression Note (Signed)
Transition of Care [facility]) - Progression Note    Patient Details  Name: Benjamin Bolton MRN: [id number] Date of Birth: 09-03-1960  Transition of [facility]) CM/SW Contact  Margarito Liner, LCSW Phone Number: 10/06/2023, 2:32 PM  Clinical Narrative: CSW continues to follow progress. Patient now with NG and mitts.    Expected Discharge Plan and Services                                               Social Determinants of Health (SDOH) Interventions SDOH Screenings   Food Insecurity: No Food Insecurity (09/12/2023)  Recent Concern: Food Insecurity - Food Insecurity Present (08/25/2023)   Received from [facility]  Housing: Unknown (09/12/2023)  Transportation Needs: No Transportation Needs (09/12/2023)  Recent Concern: Transportation Needs - Unmet Transportation Needs (08/25/2023)   Received from [facility]  Utilities: Not At Risk (09/12/2023)  Recent Concern: Utilities - At Risk (07/23/2023)   Received from [facility]  Financial Resource Strain: Low Risk  (08/25/2023)   Received from [facility]  Recent Concern: Financial Resource Strain - Medium Risk (07/23/2023)   Received from [facility]  Physical Activity: Insufficiently Active (08/25/2023)   Received from [facility]  Social Connections: Socially Isolated (08/25/2023)   Received from [facility]  Stress: No Stress Concern Present (08/25/2023)   Received from [facility]  Tobacco Use: High Risk (09/25/2023)  Health Literacy: Inadequate Health Literacy (08/25/2023)   Received from [facility]    Readmission Risk Interventions     No data to display

## 2023-10-06 NOTE — Progress Notes (Signed)
NAME:  Benjamin Bolton, MRN:  [id number], DOB:  [birth year], LOS: 26 ADMISSION DATE:  09/10/2023, CONSULTATION DATE: 09/13/2023  Brief Pt Description / Synopsis:  63 y.o. 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   History of Present Illness:  Benjamin Bolton is a 63 year old male with a history of severe COPD followed by [facility] who presents to the hospital from [facility] clinic on 1/8 with increased shortness of breath. He was admitted to the medicine service for management of COPD exacerbation and found to have an RSV infection.   Patient was admitted to [facility] and initiated on nebulizers, steroids, and antibiotics. He was managed medically for COPD exacerbation and UTI but developed worsening respiratory distress this AM requiring the initiation of BiPAP. Rapid response was called and he was transferred to the ICU. Initial blood gas showed pH of 7.2, with repeat VBG showing he remained acidemic at 7.18. Given altered mental status and respiratory failure, decision made at the bedside to proceed with emergent intubation.  Please see "Significant Hospital Events" section below for full detailed hospital course.  Pertinent  Medical History  DVT on Eliquis COPD on 2L O2 Type II Diabetes Mellitus  GERD HLD Pulmonary Nodule Tobacco Abuse   Micro Data:  1/8: SARS-CoV-2 PCR>>negative 1/8: Blood culture x2>> no growth 1/8: Urine>> multiple species (suggest recollection) 1/9: RVP>> + RSV 1/9: Sputum>> normal respiratory flora 1/9: HIV Screen>> non reactive 1/11: MRSA PCR>> negative 1/19: Tracheal aspirate>> normal respiratory flor 1/22: Sputum>> normal respiratory flora  2/3: MRSA PCR>>negative  Antimicrobials:   Anti-infectives (From admission, onward)    Start     Dose/Rate  Route Frequency Ordered Stop   10/06/23 0500  vancomycin (VANCOREADY) IVPB 750 mg/150 mL  Status:  Discontinued       Placed in "Followed by" Linked Group   750 mg 150 mL/hr over 60 Minutes Intravenous Every 12 hours 10/05/23 1412 10/05/23 1737   10/06/23 0500  vancomycin (VANCOCIN) 750 mg in sodium chloride 0.9 % 250 mL IVPB        750 mg 250 mL/hr over 60 Minutes Intravenous Every 12 hours 10/05/23 1737     10/05/23 1700  vancomycin (VANCOREADY) IVPB 1250 mg/250 mL       Placed in "Followed by" Linked Group   1,250 mg 166.7 mL/hr over 90 Minutes Intravenous  Once 10/05/23 1412 10/05/23 1808   10/05/23 1600  piperacillin-tazobactam (ZOSYN) IVPB 3.375 g        3.375 g 12.5 mL/hr over 240 Minutes Intravenous Every 8 hours 10/05/23 1410     10/04/23 2200  azithromycin (ZITHROMAX) tablet 250 mg  Status:  Discontinued        250 mg Oral Daily at bedtime 10/04/23 0907 10/04/23 1150   10/04/23 1200  doxycycline (VIBRAMYCIN) 100 mg in sodium chloride 0.9 % 250 mL IVPB  Status:  Discontinued        100 mg 125 mL/hr over 120 Minutes Intravenous 2 times daily 10/04/23 1147 10/05/23 1410   09/29/23 2000  azithromycin (ZITHROMAX) 250 mg in dextrose 5 % 125 mL IVPB  Status:  Discontinued        250 mg 127.5 mL/hr over 60 Minutes Intravenous Every 24 hours 09/29/23 1801 10/04/23 0907   09/29/23 1245  azithromycin (ZITHROMAX) tablet 250 mg  Status:  Discontinued        250 mg Per Tube Daily 09/29/23 1150 09/29/23 1801   09/25/23 1200  linezolid (ZYVOX) IVPB 600 mg  Status:  Discontinued        600 mg 300 mL/hr over 60 Minutes Intravenous Every 12 hours 09/25/23 0918 09/26/23 1206   09/25/23 1015  ceFEPIme (MAXIPIME) 2 g in sodium chloride 0.9 % 100 mL IVPB  Status:  Discontinued        2 g 200 mL/hr over 30 Minutes Intravenous Every 8 hours 09/25/23 0918 09/29/23 1053   09/25/23 0000  ceFAZolin (ANCEF) IVPB 2g/100 mL premix        2 g 200 mL/hr over 30 Minutes Intravenous  Once 09/23/23 1103  09/25/23 0013   09/22/23 0010  vancomycin (VANCOCIN) IVPB 750 mg/150 ml premix  Status:  Discontinued        750 mg 150 mL/hr over 60 Minutes Intravenous Every 12 hours 09/22/23 0004 09/22/23 1039   09/22/23 0000  Vancomycin (VANCOCIN) 750 mg IVPB  Status:  Discontinued        750 mg 150 mL/hr over 60 Minutes Intravenous Every 12 hours 09/21/23 1120 09/21/23 2356   09/22/23 0000  vancomycin (VANCOREADY) IVPB 750 mg/150 mL  Status:  Discontinued        750 mg 150 mL/hr over 60 Minutes Intravenous Every 12 hours 09/21/23 2356 09/22/23 0004   09/21/23 1300  vancomycin (VANCOCIN) IVPB 1000 mg/200 mL premix        1,000 mg 200 mL/hr over 60 Minutes Intravenous  Once 09/21/23 1120 09/21/23 1900   09/21/23 1215  piperacillin-tazobactam (ZOSYN) IVPB 3.375 g  Status:  Discontinued        3.375 g 12.5 mL/hr over 240 Minutes Intravenous Every 8 hours 09/21/23 1120 09/23/23 1257   09/21/23 1130  vancomycin (VANCOREADY) IVPB 1250 mg/250 mL  Status:  Discontinued        1,250 mg 166.7 mL/hr over 90 Minutes Intravenous  Once 09/21/23 1046 09/21/23 1120   09/21/23 1100  piperacillin-tazobactam (ZOSYN) IVPB 3.375 g  Status:  Discontinued        3.375 g 12.5 mL/hr over 240 Minutes Intravenous  Once 09/21/23 1046 09/21/23 1152   09/13/23 1445  piperacillin-tazobactam (ZOSYN) IVPB 3.375 g        3.375 g 12.5 mL/hr over 240 Minutes Intravenous Every 8 hours 09/13/23 1351 09/17/23 0157   09/10/23 2315  cefTRIAXone (ROCEPHIN) 1 g in sodium chloride 0.9 % 100 mL IVPB  Status:  Discontinued        1 g 200 mL/hr over 30 Minutes Intravenous Every 24 hours 09/10/23 2305 09/13/23 1336       Significant Hospital Events: Including procedures, antibiotic start and stop dates in addition to other pertinent events   1/08: admit to [facility] 1/11: rapid response, transfer to ICU, intubated 1/12: remains vented, sedated, does not follow commands.  Failed SBT  1/13: Overnight due to concerns of aspiration TF's held.  Will  repeat SBT today 1/14: Overnight pt had possible seizure activity with rhythmic tremors noted in the mouth/lower face/neck during episode pt unresponsive to pain.  Received 2 mg of versed and 2g keppra bolus with resolution of symptoms.  CT Head negative.  Standing dose of keppra 1g bid.  Neurology consulted.    1/15: On minimal vent settings, unable to tolerate WUA due to increased WOB. Shifting measures given for Hyperkalemia of 5.5 with peaked T waves.  Abdomen distended, KUB without evidence of  obstruction, give Lactulose.  Diurese with 40 mg IV Lasix x1. 1/16: Failed WUA, with increased WOB and hypoxic with O2 sats dropping to mid 70's.  Add Seroquel.  Palliative Care consulted to assist with GOC. 1/17: Precedex started for WUA.  Failed SBT due to increased WOB, tachypnea, and hypoxia.  Increase Seroquel to 50 mg BID. Transition Heparin to Elqiuis 1/18: Pt remains mechanically intubated and has failed multiple SBT's.  Planning for family meeting today at bedside with palliative care and PCCM will perform SBT once family arrives at bedside.  Remains sedated with propofol and fentanyl gtts.  Family meeting held code status changed to DNR per family request.  Pts family trying to decide if they want to proceed with tracheostomy.  Required additional sedation and prn vecuronium overnight due to vent dyssynchrony  1/19: Pt febrile overnight with increased vasopressor requirements levophed gtt @12  mcg/min and febrile tmax 101 degrees F concerning for recurrent sepsis.  Broad spectrum abx started. Pt remains mechanically intubated and unable to successfully liberate from ventilator  1/20 remains on vent, severe rap failure 1/21 remains on vent prolonged exp phase air trapping  on vent 1/22 remains on vent 1/23 CT ABD- Colonic ileus pattern shows significant improvement since the prior radiograph with no further significant gaseous distension of the colon. 1/23 CT chest B/L Lower lobe pneumonia severe  emphysema 1/23 CT head-possible cerebral edema 1/23 remains on vent failure to wean 1/24 remains intubated, severe hypoxia, shock 1/25 remains intubated, severe hypoxia, shock 09/29/23- Extubated to BIPAP, I met with family at bedside and reviewed medical plan as well as answered questions.  09/30/23- patient is more awake and able to speak few words asking for water.  10/01/23- patient on HFNC 40/40 able to communicate verbally, will perform SLP and PT/OT eval. Req precedex for aggitation weaning.  CRP trending down and steroids tapering in parallel. 10/02/23- patient weaned to 4L/min, he is eating.  For PT/OT today.  Patient being optimized for TRH transfer.  Weaning from precedex. Patient reports depression but expresses that he wishes to live and wants to continue working towards improvement.  10/03/23- patient was aggitated today and was curing at staff.  He did bedside swallow and passed.  He is on prednisone with mild leukocytosis. Today he is being optimized for transfer to medical floor.  10/04/23- patient is having weak cough and is not compliant with medications.  He refuses most therapy ordered.  I have called multiple family members and asked them to come in to encourage him to work with [place]. His wife and sister came in and report he wants to use crack which he normally does routinely.  I've offered medications for withdrawal and called palliative care evaluation due to overall poor prognosis.  10/05/23- patient was more compliant after family came in and encouraged him to work with [place].  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  His leukocytosis and CRP are incrementing despite tapering dose of steroids concerning for infectious PNA.  Cough reflex is poor.  Palliative has seen him and he is DNR/DNI but wants to keep full score of care.  Hypersal and mucomyst neb with RT initiate for cough/airway clearance 10/06/23-  Remains quite weak,  not requiring vasopressors, on 4L [place].  MRSA PCR is negative, discontinue Vancomycin.  Start tube feeds via NGT.  Interim History / Subjective:  As outlined above in Significant Hospital Events section    Objective  Blood pressure 120/77, pulse 72, temperature 98.6 F (37 C), temperature source Oral, resp. rate (!) 22, height 6' (1.829 m), weight 57.9 kg, SpO2 94%.        Intake/Output Summary (Last 24 hours) at 10/06/2023 1610 Last data filed at 10/06/2023 0600 Gross per 24 hour  Intake 3119.96 ml  Output 1400 ml  Net 1719.96 ml   Filed Weights   10/04/23 0500 10/05/23 0500 10/06/23 0500  Weight: 57.9 kg 58.9 kg 57.9 kg    Examination: General: Acutely on chronically-ill appearing male, sitting in bed, on 4L [place], in NAD HENT: Supple, no JVD, dry MM Lungs: Diminished breath sounds throughout, even, nonlabored Cardiovascular: Sinus tachycardia, no m/r/g, 2+ radial/1+ distal, no edema  Abdomen: Soft, nontender, nondistended, no guarding or rebound tenderness, BS+x4 Extremities: Generalized weakness, Normal bulk and tone, no deformities, trace edema to BLE Neuro: Awake and alert, oriented only to self and place, moves all extremities to commands, no focal deficits GU: External male catheter in place  Resolved Hospital Problem list     Assessment & Plan:   #Acute toxic metabolic encephalopathy  #Possible seizure activity ~ RESOLVED -Treatment of metabolic derangements as outlined below -Provide supportive care -Promote normal sleep/wake cycle and family presence -Avoid sedating medications as able -Keppra was discontinued -Seizure precautions   #Mildly elevated troponin secondary to demand ischemia  #Hypotension suspect secondary to sedating medication ~ RESOLVED Hx: HLD and DVT on Eliquis Echo 09/14/23: 50 to 55%, indeterminate diastolic parameters - Continuous telemetry monitoring  - Troponin peaked at 115 - Prn levophed gtt to maintain map 65 or higher ~ not  requiring - Continue Eliquis - Cardiology signed off no further cardiac evaluations indicated at this time   #Acute respiratory failure #AECOPD   #RSV Pneumonia  -Supplemental O2 as needed to maintain O2 sats 88 to 92% -BiPAP if needed (pt is DNR/DNI) -Follow intermittent Chest X-ray & ABG as needed -Bronchodilators  -Continue Prednisone taper -ABX as above -Diuresis as BP and renal function permits -Pulmonary toilet as able  #Mild Hyperkalemia ~ RESOLVED #Hyponatremia ~ IMPROVED #Hypophosphatemia ~ RESOLVED -Monitor I&O's / urinary output -Follow BMP -Ensure adequate renal perfusion -Avoid nephrotoxic agents as able -Replace electrolytes as indicated ~ Pharmacy following for assistance with electrolyte replacement  #Anemia without obvious signs of acute blood loss  -Monitor for S/Sx of bleeding -Trend CBC -Eliquis for AC/VTE Prophylaxis  -Transfuse for Hgb <7  #Type II diabetes mellitus  #Hypoglycemia~resolved - CBG's q4hrs  - Continue resistance SSI and will start scheduled novolog 4 units q4hrs if TF's infusing  - Diabetes coordinator consulted appreciate input  - Follow hypo/hyperglycemic protocol  - Target CBG's 140 to 180       Best Practice (right click and "Reselect all SmartList Selections" daily)   Diet/type: TF's DVT prophylaxis: Eliquis  Pressure ulcer(s): N/A GI prophylaxis: PPI Lines: RUE PICC Line and still needed  Foley:  N/A Code Status:  DNR/DNI Last date of multidisciplinary goals of care discussion [10/06/2023]  02/03: Will update pts family today when they arrive at bedside.  Labs   CBC: Recent Labs  Lab 10/01/23 0502 10/02/23 0443 10/03/23 0503 10/04/23 0431 10/06/23 0557  WBC 9.1 8.7 12.4* 15.6* 13.8*  HGB 8.3* 8.6* 10.1* 10.0* 8.2*  HCT 26.1* 26.5* 31.8* 31.3* 26.0*  MCV 94.6 93.6 96.1 94.8 96.7  PLT 288 324 406* 353 335    Basic Metabolic Panel: Recent Labs  Lab 10/02/23 0443 10/03/23 0503 10/04/23 0431 10/05/23 0513  10/06/23 0557  NA 141 141 139 139 138  K 3.3* 3.6 3.3* 4.1 3.5  CL 101 102 102 104 102  CO2 30 26 25 26 26   GLUCOSE 84 98 113* 136* 93  BUN 16 14 14 20 13   CREATININE 0.41* 0.43* 0.39* 0.48* 0.36*  CALCIUM 8.4* 8.6* 8.1* 8.3* 7.6*  MG 1.8 1.8 1.7 2.1 1.4*  PHOS 2.1* 2.6 2.0* 2.6 2.2*   GFR: Estimated Creatinine Clearance: 79.4 mL/min (A) (by C-G formula based on SCr of 0.36 mg/dL (L)). Recent Labs  Lab 10/02/23 0443 10/03/23 0503 10/04/23 0431 10/06/23 0557  WBC 8.7 12.4* 15.6* 13.8*    Liver Function Tests: Recent Labs  Lab 09/29/23 1725 10/01/23 0502 10/06/23 0557  ALBUMIN 2.2* 2.0* 1.6*   No results for input(s): "LIPASE", "AMYLASE" in the last 168 hours. No results for input(s): "AMMONIA" in the last 168 hours.  ABG    Component Value Date/Time   PHART 7.43 09/20/2023 2332   PCO2ART 62 (H) 09/20/2023 2332   PO2ART 53 (L) 09/20/2023 2332   HCO3 37.1 (H) 09/30/2023 0843   O2SAT 72.6 09/30/2023 0843     Coagulation Profile: No results for input(s): "INR", "PROTIME" in the last 168 hours.   Cardiac Enzymes: No results for input(s): "CKTOTAL", "CKMB", "CKMBINDEX", "TROPONINI" in the last 168 hours.  HbA1C: Hgb A1c MFr Bld  Date/Time Value Ref Range Status  09/15/2023 09:24 PM 6.4 (H) 4.8 - 5.6 % Final    Comment:    (NOTE) Pre diabetes:          5.7%-6.4%  Diabetes:              >6.4%  Glycemic control for   <7.0% adults with diabetes     CBG: Recent Labs  Lab 10/05/23 1615 10/05/23 1944 10/05/23 2358 10/06/23 0401 10/06/23 0732  GLUCAP 124* 148* 86 90 87    Review of Systems:   Unable to assess due to AMS  Past Medical History:  He,  has a past medical history of COPD (chronic obstructive pulmonary disease) (HCC), DVT (deep venous thrombosis) (HCC), Dyspnea, GERD (gastroesophageal reflux disease), HLD (hyperlipidemia), migraines, Oxygen dependent, Pneumonia (2023), Pre-diabetes, Pulmonary nodule, Rotator cuff tear, right, Tobacco  abuse, and Tobacco use.   Surgical History:   Past Surgical History:  Procedure Laterality Date   CHEST TUBE INSERTION     SHOULDER ARTHROSCOPY WITH SUBACROMIAL DECOMPRESSION, ROTATOR CUFF REPAIR AND BICEP TENDON REPAIR Right 02/18/2023   Procedure: RIGHT SHOULDER ARTHROSCOPY WITH DEBRIDEMENT, DECOMPRESSION, ROTATOR CUFF REPAIR, BICEPS TENODESIS.;  Surgeon: Christena Flake, MD;  Location: [facility] ORS;  Service: Orthopedics;  Laterality: Right;     Social History:   reports that he has been smoking cigarettes. He has a 12 pack-year smoking history. He has never used smokeless tobacco. He reports current alcohol use. He reports that he does not use drugs.   Family History:  His family history is not on file.   Allergies No Active Allergies   Home Medications  Prior to Admission medications   Medication Sig Start Date End Date Taking? Authorizing Provider  albuterol (PROVENTIL) (2.5 MG/3ML) 0.083% nebulizer solution USE 1 VIAL IN NEBULIZER EVERY 6 HOURS AS NEEDED FOR WHEEZING 06/28/21  Yes [provider]  albuterol (VENTOLIN HFA) 108 (90 Base) MCG/ACT inhaler Inhale 1 puff into the lungs every 4 (four) hours as needed for wheezing or shortness of breath.   Yes [provider]  APIXABAN Everlene Balls) VTE STARTER PACK (10MG  AND 5MG ) Take as directed on  package: start with two-5mg  tablets twice daily for 7 days. On day 8, switch to one-5mg  tablet twice daily. 02/26/23  Yes Merwyn Katos, MD  ondansetron (ZOFRAN) 4 MG tablet Take 1 tablet (4 mg total) by mouth every 6 (six) hours as needed for nausea. 02/19/23  Yes Anson Oregon, PA-C  oxyCODONE (OXY IR/ROXICODONE) 5 MG immediate release tablet Take 1 tablet by mouth 2 (two) times daily as needed. 08/29/23  Yes [provider]  OXYGEN Inhale 3 L into the lungs continuous.   Yes [provider]  predniSONE (DELTASONE) 10 MG tablet Take 2-6 tablets by mouth daily with breakfast.  ORIGINAL ZOX:WRUE 6 tabs daily x4  days; then 4 tabs daily x4 days; then 2 tabs daily x4 days.   Yes [provider]  predniSONE (DELTASONE) 20 MG tablet Take 20 mg by mouth daily with breakfast.   Yes [provider]  TRELEGY ELLIPTA 100-62.5-25 MCG/ACT AEPB Inhale 1 puff into the lungs every morning. 07/04/21  Yes [provider]  amoxicillin-clavulanate (AUGMENTIN) 875-125 MG tablet Take 1 tablet by mouth every 12 (twelve) hours. Patient not taking: Reported on 09/10/2023    [provider]  benralizumab (FASENRA PEN) 30 MG/ML prefilled autoinjector Inject 30 mg into the skin as directed. Patient not taking: Reported on 09/10/2023 07/23/23   [provider]  nystatin (MYCOSTATIN) 100000 UNIT/ML suspension Take 5 mLs by mouth 4 (four) times daily. Patient not taking: Reported on 09/10/2023 09/04/23   [provider]  omeprazole (PRILOSEC) 20 MG capsule Take 20 mg by mouth daily. Patient not taking: Reported on 09/10/2023    [provider]  oxyCODONE (OXY IR/ROXICODONE) 5 MG immediate release tablet Take 1-2 tablets (5-10 mg total) by mouth every 4 (four) hours as needed for severe pain. 02/19/23   Anson Oregon, PA-C     Critical care time: 40 minutes      Harlon Ditty, AGACNP-BC [facility] Prefer epic messenger for cross cover needs If after hours, please call E-link

## 2023-10-07 ENCOUNTER — Inpatient Hospital Stay: Payer: 59

## 2023-10-07 DIAGNOSIS — I824Y9 Acute embolism and thrombosis of unspecified deep veins of unspecified proximal lower extremity: Secondary | ICD-10-CM | POA: Diagnosis not present

## 2023-10-07 DIAGNOSIS — J9621 Acute and chronic respiratory failure with hypoxia: Secondary | ICD-10-CM | POA: Diagnosis not present

## 2023-10-07 DIAGNOSIS — Z515 Encounter for palliative care: Secondary | ICD-10-CM | POA: Diagnosis not present

## 2023-10-07 DIAGNOSIS — J441 Chronic obstructive pulmonary disease with (acute) exacerbation: Secondary | ICD-10-CM | POA: Diagnosis not present

## 2023-10-07 DIAGNOSIS — E43 Unspecified severe protein-calorie malnutrition: Secondary | ICD-10-CM | POA: Diagnosis not present

## 2023-10-07 LAB — BASIC METABOLIC PANEL
Anion gap: 9 (ref 5–15)
BUN: 13 mg/dL (ref 8–23)
CO2: 29 mmol/L (ref 22–32)
Calcium: 7.9 mg/dL — ABNORMAL LOW (ref 8.9–10.3)
Chloride: 98 mmol/L (ref 98–111)
Creatinine, Ser: 0.44 mg/dL — ABNORMAL LOW (ref 0.61–1.24)
GFR, Estimated: >60 mL/min (ref >60)
Glucose, Bld: 137 mg/dL — ABNORMAL HIGH (ref 70–99)
Potassium: 3.8 mmol/L (ref 3.5–5.1)
Sodium: 136 mmol/L (ref 135–145)

## 2023-10-07 LAB — MAGNESIUM: Magnesium: 2.2 mg/dL (ref 1.7–2.4)

## 2023-10-07 LAB — GLUCOSE, CAPILLARY
Glucose-Capillary: 141 mg/dL — ABNORMAL HIGH (ref 70–99)
Glucose-Capillary: 177 mg/dL — ABNORMAL HIGH (ref 70–99)
Glucose-Capillary: 247 mg/dL — ABNORMAL HIGH (ref 70–99)
Glucose-Capillary: 206 mg/dL — ABNORMAL HIGH (ref 70–99)
Glucose-Capillary: 183 mg/dL — ABNORMAL HIGH (ref 70–99)
Glucose-Capillary: 136 mg/dL — ABNORMAL HIGH (ref 70–99)

## 2023-10-07 LAB — CBC
HCT: 26.7 % — ABNORMAL LOW (ref 39.0–52.0)
Hemoglobin: 8.7 g/dL — ABNORMAL LOW (ref 13.0–17.0)
MCH: 29.8 pg (ref 26.0–34.0)
MCHC: 32.6 g/dL (ref 30.0–36.0)
MCV: 91.4 fL (ref 80.0–100.0)
Platelets: 412 10*3/uL — ABNORMAL HIGH (ref 150–400)
RBC: 2.92 MIL/uL — ABNORMAL LOW (ref 4.22–5.81)
RDW: 14.8 % (ref 11.5–15.5)
WBC: 10 10*3/uL (ref 4.0–10.5)
nRBC: 0 % (ref 0.0–0.2)

## 2023-10-07 LAB — PHOSPHORUS: Phosphorus: 2.2 mg/dL — ABNORMAL LOW (ref 2.5–4.6)

## 2023-10-07 LAB — C-REACTIVE PROTEIN: CRP: 16.7 mg/dL — ABNORMAL HIGH (ref <1.0)

## 2023-10-07 MED ORDER — FUROSEMIDE 10 MG/ML IJ SOLN
20.0000 mg | Freq: Once | INTRAMUSCULAR | Status: AC
  Administered 2023-10-07: 20 mg via INTRAVENOUS
  Filled 2023-10-07: qty 2

## 2023-10-07 MED ORDER — IPRATROPIUM-ALBUTEROL 0.5-2.5 (3) MG/3ML IN SOLN
3.0000 mL | Freq: Four times a day (QID) | RESPIRATORY_TRACT | Status: DC
  Administered 2023-10-07 – 2023-10-08 (×4): 3 mL via RESPIRATORY_TRACT
  Filled 2023-10-07 (×5): qty 3

## 2023-10-07 MED ORDER — POTASSIUM CHLORIDE 20 MEQ PO PACK
20.0000 meq | PACK | Freq: Once | ORAL | Status: AC
Start: 2023-10-07 — End: 2023-10-07
  Administered 2023-10-07: 20 meq
  Filled 2023-10-07: qty 1

## 2023-10-07 MED ORDER — POTASSIUM & SODIUM PHOSPHATES 280-160-250 MG PO PACK
1.0000 | PACK | Freq: Three times a day (TID) | ORAL | Status: AC
  Administered 2023-10-07 (×3): 1
  Filled 2023-10-07 (×3): qty 1

## 2023-10-07 NOTE — Progress Notes (Signed)
 Occupational Therapy Treatment Patient Details Name: Benjamin Bolton MRN: [id number] DOB: August 24, 1961 Today's Date: 10/07/2023   History of present illness 63 y.o. 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   OT comments  Pt is supine in bed on arrival. Easily arousable and agreeable to OT session. He denies pain. Pt continues to require Mod A for bed mobility to manage BLEs/trunk to reach upright position with Max A to forward scoot. Pt progressing to CGA for brief period then mostly Min A for seated balance with ability to reach to his L side. Cueing to bring neck/head to neutral. Pt declined trial for STS. He required Max A for return to supine with large BM noted requiring full linen change and multiple rolling trials. Nurse called in to assist; Max/Mod A for rolling with cueing for technique. Total assist for hygiene. VSS throughout session.  Pt returned to bed with all needs in place and will cont to require skilled acute OT services to maximize his safety and IND to return to PLOF.       If plan is discharge home, recommend the following:  Two people to help with walking and/or transfers;A lot of help with bathing/dressing/bathroom;Help with stairs or ramp for entrance;Assistance with cooking/housework;Assist for transportation;Assistance with feeding   Equipment Recommendations  Other (comment) (defer)    Recommendations for Other Services      Precautions / Restrictions Precautions Precautions: Fall Restrictions Weight Bearing Restrictions Per Provider Order: No       Mobility Bed Mobility Overal bed mobility: Needs Assistance Bed Mobility: Sit to Supine, Supine to Sit, Rolling Rolling: Max assist   Supine to sit: Mod assist, HOB elevated Sit to  supine: Max assist        Transfers                   General transfer comment: patient declined attempting to stand; total assistance required to scoot to the R side at EOB with cues for technique.     Balance Overall balance assessment: Needs assistance Sitting-balance support: Feet supported Sitting balance-Leahy Scale: Poor Sitting balance - Comments: CGA progressing to Mod A with fatigue and BM; L lateral lean today as pt was reaching for the sheets at bottom of the bed Postural control: Left lateral lean                                 ADL either performed or assessed with clinical judgement   ADL Overall ADL's : Needs assistance/impaired                             Toileting- Clothing Manipulation and Hygiene: Bed level;Total assistance              Extremity/Trunk Assessment              Vision       Perception     Praxis      Cognition Arousal: Alert, Lethargic Behavior During Therapy: Flat affect Overall Cognitive Status: Impaired/Different from baseline Area of Impairment: Safety/judgement, Following commands, Orientation                 Orientation Level: Disoriented to, Place, Time  Following Commands: Follows one step commands with increased time Safety/Judgement: Decreased awareness of safety, Decreased awareness of deficits     General Comments: very low tone, difficut to understand at times. needs encouragement        Exercises      Shoulder Instructions       General Comments VSS during session; 105-115 HR    Pertinent Vitals/ Pain       Pain Assessment Pain Assessment: No/denies pain  Home Living                                          Prior Functioning/Environment              Frequency  Min 1X/week        Progress Toward Goals  OT Goals(current goals can now be found in the care plan section)  Progress towards OT goals: Progressing toward  goals  Acute Rehab OT Goals Patient Stated Goal: improve strength OT Goal Formulation: With patient Time For Goal Achievement: 10/15/23 Potential to Achieve Goals: Good  Plan      Co-evaluation                 AM-PAC OT 6 Clicks Daily Activity     Outcome Measure   Help from another person eating meals?: A Lot Help from another person taking care of personal grooming?: A Lot Help from another person toileting, which includes using toliet, bedpan, or urinal?: Total Help from another person bathing (including washing, rinsing, drying)?: A Lot Help from another person to put on and taking off regular upper body clothing?: A Lot Help from another person to put on and taking off regular lower body clothing?: A Lot 6 Click Score: 11    End of Session Equipment Utilized During Treatment: Oxygen  OT Visit Diagnosis: Other abnormalities of gait and mobility (R26.89);Muscle weakness (generalized) (M62.81)   Activity Tolerance Patient tolerated treatment well   Patient Left in bed;with call bell/phone within reach;with bed alarm set   Nurse Communication Mobility status        Time: 8896-8865 OT Time Calculation (min): 31 min  Charges: OT General Charges $OT Visit: 1 Visit OT Treatments $Therapeutic Activity: 23-37 mins  Benjamin Bolton, OTR/L  10/07/23, 12:38 PM   Benjamin Bolton 10/07/2023, 12:35 PM

## 2023-10-07 NOTE — Progress Notes (Addendum)
                                                     Palliative Care Progress Note, Assessment & Plan   Patient Name: Benjamin Bolton       Date: 10/07/2023 DOB: 1960/12/23  Age: 63 y.o. MRN#: [id number] Attending Physician: Isadora Hose, MD Primary Care Physician: [facility] Admit Date: 09/10/2023  Subjective: Patient is lying in bed in no apparent distress with NG tube and oxygen in place.  He acknowledges my presence but does not engage in discussions with me.  No family or friends present during my visit.  HPI: 63 y.o. 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  Patient has been successfully extubated, however has required NG tube for nutritional support.  He remains weak and intermittently compliant with PT/OT/medication regimen compliance.  Patient has been declined potential for admission to [facility] inpatient rehab.  Summary of counseling/coordination of care: Extensive chart review completed prior to meeting patient including labs, vital signs, imaging, progress notes, orders, and available advanced directive documents from current and previous encounters.   After reviewing the patient's chart and assessing the patient at bedside, I attempted speak with patient in regards to symptom management and goals of care.  He is not engaging in discussions with me today.  No signs of distress noted.  After assessing the patient, spoke with RN.  RN shares no family has been present today.  No acute issues.  No adjustment to [facility] or plan of care at this time.  DNR with limited interventions remains.  PMT will continue to follow and support  patient and family throughout his hospitalization.  Physical Exam Vitals reviewed.  Constitutional:      Appearance: He is normal weight.  HENT:     Head: Normocephalic.  Eyes:     Pupils: Pupils are equal, round, and reactive to light.  Cardiovascular:     Rate and Rhythm: Normal rate.  Pulmonary:     Effort: Pulmonary effort is normal.  Musculoskeletal:     Comments: Generalized weakness  Skin:    General: Skin is warm and dry.  Neurological:     Mental Status: He is alert.  Psychiatric:        Mood and Affect: Mood normal. Mood is not anxious.        Behavior: Behavior normal. Behavior is not agitated.             Total Time 25 minutes   Time spent includes: Detailed review of medical records (labs, imaging, vital signs), medically appropriate exam (mental status, respiratory, cardiac, skin), discussed with treatment team, counseling and educating patient, family and staff, documenting clinical information, medication management and coordination of care.  Lamarr L. Arvid, DNP, FNP-BC Palliative Medicine Team

## 2023-10-07 NOTE — Progress Notes (Signed)
 NAME:  Benjamin Bolton, MRN:  [id number], DOB:  July 11, 1961, LOS: 27 ADMISSION DATE:  09/10/2023, CONSULTATION DATE: 09/13/2023  Brief Pt Description / Synopsis:  63 y.o. 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   History of Present Illness:  Benjamin Bolton is a 63 year old male with a history of severe COPD followed by [facility] who presents to the hospital from [facility] clinic on 1/8 with increased shortness of breath. He was admitted to the medicine service for management of COPD exacerbation and found to have an RSV infection.   Patient was admitted to [facility] and initiated on nebulizers, steroids, and antibiotics. He was managed medically for COPD exacerbation and UTI but developed worsening respiratory distress this AM requiring the initiation of BiPAP. Rapid response was called and he was transferred to the ICU. Initial blood gas showed pH of 7.2, with repeat VBG showing he remained acidemic at 7.18. Given altered mental status and respiratory failure, decision made at the bedside to proceed with emergent intubation.  Please see Significant Hospital Events section below for full detailed hospital course.  Pertinent  Medical History  DVT on Eliquis  COPD on 2L O2 Type II Diabetes Mellitus  GERD HLD Pulmonary Nodule Tobacco Abuse   Micro Data:  1/8: SARS-CoV-2 PCR>>negative 1/8: Blood culture x2>> no growth 1/8: Urine>> multiple species (suggest recollection) 1/9: RVP>> + RSV 1/9: Sputum>> normal respiratory flora 1/9: HIV Screen>> non reactive 1/11: MRSA PCR>> negative 1/19: Tracheal aspirate>> normal respiratory flor 1/22: Sputum>> normal respiratory flora  2/3: MRSA PCR>>negative  Antimicrobials:   Anti-infectives (From admission, onward)    Start     Dose/Rate  Route Frequency Ordered Stop   10/06/23 0500  vancomycin  (VANCOREADY) IVPB 750 mg/150 mL  Status:  Discontinued       Placed in Followed by Linked Group   750 mg 150 mL/hr over 60 Minutes Intravenous Every 12 hours 10/05/23 1412 10/05/23 1737   10/06/23 0500  vancomycin  (VANCOCIN ) 750 mg in sodium chloride  0.9 % 250 mL IVPB  Status:  Discontinued        750 mg 250 mL/hr over 60 Minutes Intravenous Every 12 hours 10/05/23 1737 10/06/23 1338   10/05/23 1700  vancomycin  (VANCOREADY) IVPB 1250 mg/250 mL       Placed in Followed by Linked Group   1,250 mg 166.7 mL/hr over 90 Minutes Intravenous  Once 10/05/23 1412 10/05/23 1808   10/05/23 1600  piperacillin -tazobactam (ZOSYN ) IVPB 3.375 g        3.375 g 12.5 mL/hr over 240 Minutes Intravenous Every 8 hours 10/05/23 1410     10/04/23 2200  azithromycin  (ZITHROMAX ) tablet 250 mg  Status:  Discontinued        250 mg Oral Daily at bedtime 10/04/23 0907 10/04/23 1150   10/04/23 1200  doxycycline  (VIBRAMYCIN ) 100 mg in sodium chloride  0.9 % 250 mL IVPB  Status:  Discontinued        100 mg 125 mL/hr over 120 Minutes Intravenous 2 times daily 10/04/23 1147 10/05/23 1410   09/29/23 2000  azithromycin  (ZITHROMAX ) 250 mg in dextrose  5 % 125 mL IVPB  Status:  Discontinued        250 mg 127.5 mL/hr over 60 Minutes Intravenous Every 24 hours 09/29/23 1801 10/04/23 0907   09/29/23 1245  azithromycin  (ZITHROMAX ) tablet  250 mg  Status:  Discontinued        250 mg Per Tube Daily 09/29/23 1150 09/29/23 1801   09/25/23 1200  linezolid  (ZYVOX ) IVPB 600 mg  Status:  Discontinued        600 mg 300 mL/hr over 60 Minutes Intravenous Every 12 hours 09/25/23 0918 09/26/23 1206   09/25/23 1015  ceFEPIme  (MAXIPIME ) 2 g in sodium chloride  0.9 % 100 mL IVPB  Status:  Discontinued        2 g 200 mL/hr over 30 Minutes Intravenous Every 8 hours 09/25/23 0918 09/29/23 1053   09/25/23 0000  ceFAZolin  (ANCEF ) IVPB 2g/100 mL premix        2 g 200 mL/hr over 30 Minutes  Intravenous  Once 09/23/23 1103 09/25/23 0013   09/22/23 0010  vancomycin  (VANCOCIN ) IVPB 750 mg/150 ml premix  Status:  Discontinued        750 mg 150 mL/hr over 60 Minutes Intravenous Every 12 hours 09/22/23 0004 09/22/23 1039   09/22/23 0000  Vancomycin  (VANCOCIN ) 750 mg IVPB  Status:  Discontinued        750 mg 150 mL/hr over 60 Minutes Intravenous Every 12 hours 09/21/23 1120 09/21/23 2356   09/22/23 0000  vancomycin  (VANCOREADY) IVPB 750 mg/150 mL  Status:  Discontinued        750 mg 150 mL/hr over 60 Minutes Intravenous Every 12 hours 09/21/23 2356 09/22/23 0004   09/21/23 1300  vancomycin  (VANCOCIN ) IVPB 1000 mg/200 mL premix        1,000 mg 200 mL/hr over 60 Minutes Intravenous  Once 09/21/23 1120 09/21/23 1900   09/21/23 1215  piperacillin -tazobactam (ZOSYN ) IVPB 3.375 g  Status:  Discontinued        3.375 g 12.5 mL/hr over 240 Minutes Intravenous Every 8 hours 09/21/23 1120 09/23/23 1257   09/21/23 1130  vancomycin  (VANCOREADY) IVPB 1250 mg/250 mL  Status:  Discontinued        1,250 mg 166.7 mL/hr over 90 Minutes Intravenous  Once 09/21/23 1046 09/21/23 1120   09/21/23 1100  piperacillin -tazobactam (ZOSYN ) IVPB 3.375 g  Status:  Discontinued        3.375 g 12.5 mL/hr over 240 Minutes Intravenous  Once 09/21/23 1046 09/21/23 1152   09/13/23 1445  piperacillin -tazobactam (ZOSYN ) IVPB 3.375 g        3.375 g 12.5 mL/hr over 240 Minutes Intravenous Every 8 hours 09/13/23 1351 09/17/23 0157   09/10/23 2315  cefTRIAXone  (ROCEPHIN ) 1 g in sodium chloride  0.9 % 100 mL IVPB  Status:  Discontinued        1 g 200 mL/hr over 30 Minutes Intravenous Every 24 hours 09/10/23 2305 09/13/23 1336       Significant Hospital Events: Including procedures, antibiotic start and stop dates in addition to other pertinent events   1/08: admit to [facility] 1/11: rapid response, transfer to ICU, intubated 1/12: remains vented, sedated, does not follow commands.  Failed SBT  1/13: Overnight due to  concerns of aspiration TF's held.  Will repeat SBT today 1/14: Overnight pt had possible seizure activity with rhythmic tremors noted in the mouth/lower face/neck during episode pt unresponsive to pain.  Received 2 mg of versed  and 2g keppra  bolus with resolution of symptoms.  CT Head negative.  Standing dose of keppra  1g bid.  Neurology consulted.    1/15: On minimal vent settings, unable to tolerate WUA due to increased WOB. Shifting measures given for Hyperkalemia of 5.5 with peaked T waves.  Abdomen  distended, KUB without evidence of obstruction, give Lactulose .  Diurese with 40 mg IV Lasix  x1. 1/16: Failed WUA, with increased WOB and hypoxic with O2 sats dropping to mid 70's.  Add Seroquel .  Palliative Care consulted to assist with GOC. 1/17: Precedex  started for WUA.  Failed SBT due to increased WOB, tachypnea, and hypoxia.  Increase Seroquel  to 50 mg BID. Transition Heparin  to Elqiuis 1/18: Pt remains mechanically intubated and has failed multiple SBT's.  Planning for family meeting today at bedside with palliative care and PCCM will perform SBT once family arrives at bedside.  Remains sedated with propofol  and fentanyl  gtts.  Family meeting held code status changed to DNR per family request.  Pts family trying to decide if they want to proceed with tracheostomy.  Required additional sedation and prn vecuronium  overnight due to vent dyssynchrony  1/19: Pt febrile overnight with increased vasopressor requirements levophed  gtt @12  mcg/min and febrile tmax 101 degrees F concerning for recurrent sepsis.  Broad spectrum abx started. Pt remains mechanically intubated and unable to successfully liberate from ventilator  1/20 remains on vent, severe rap failure 1/21 remains on vent prolonged exp phase air trapping  on vent 1/22 remains on vent 1/23 CT ABD- Colonic ileus pattern shows significant improvement since the prior radiograph with no further significant gaseous distension of the colon. 1/23 CT  chest B/L Lower lobe pneumonia severe emphysema 1/23 CT head-possible cerebral edema 1/23 remains on vent failure to wean 1/24 remains intubated, severe hypoxia, shock 1/25 remains intubated, severe hypoxia, shock 09/29/23- Extubated to BIPAP, I met with family at bedside and reviewed medical plan as well as answered questions.  09/30/23- patient is more awake and able to speak few words asking for water .  10/01/23- patient on HFNC 40/40 able to communicate verbally, will perform SLP and PT/OT eval. Req precedex  for aggitation weaning.  CRP trending down and steroids tapering in parallel. 10/02/23- patient weaned to 4L/min, he is eating.  For PT/OT today.  Patient being optimized for TRH transfer.  Weaning from precedex . Patient reports depression but expresses that he wishes to live and wants to continue working towards improvement.  10/03/23- patient was aggitated today and was curing at staff.  He did bedside swallow and passed.  He is on prednisone  with mild leukocytosis. Today he is being optimized for transfer to medical floor.  10/04/23- patient is having weak cough and is not compliant with medications.  He refuses most therapy ordered.  I have called multiple family members and asked them to come in to encourage him to work with us . His wife and sister came in and report he wants to use crack which he normally does routinely.  I've offered medications for withdrawal and called palliative care evaluation due to overall poor prognosis.  10/05/23- patient was more compliant after family came in and encouraged him to work with us .  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  His leukocytosis and CRP are incrementing despite tapering dose of steroids concerning for infectious PNA.  Cough reflex is poor.  Palliative has seen him and he is DNR/DNI but wants to keep full score of care.  Hypersal and mucomyst  neb with RT initiate for cough/airway  clearance 10/06/23-  Remains quite weak, not requiring vasopressors, on 4L [place].  MRSA PCR is negative, discontinue Vancomycin .  Start tube feeds via NGT. 10/07/23- Remains on 3L  [place], not requiring vasopressors, diurese with 20 mg Lasix  x1  dose.  Remains on feeds given poor appetite, working with PT.  Will plan to remove PICC if able to obtain peripheral access.  Transfer out to Progressive care unit.  May ultimately need LTACH placement.  Interim History / Subjective:  As outlined above in Significant Hospital Events section    Objective   Blood pressure 129/70, pulse (!) 122, temperature 99.2 F (37.3 C), temperature source Oral, resp. rate (!) 26, height 6' (1.829 m), weight 55.4 kg, SpO2 98%.    FiO2 (%):  [32 %] 32 %   Intake/Output Summary (Last 24 hours) at 10/07/2023 0757 Last data filed at 10/07/2023 0600 Gross per 24 hour  Intake 899 ml  Output 1100 ml  Net -201 ml   Filed Weights   10/05/23 0500 10/06/23 0500 10/07/23 0326  Weight: 58.9 kg 57.9 kg 55.4 kg    Examination: General: Acutely on chronically-ill appearing male, sitting in bed, on 3L [place], in NAD HENT: Supple, no JVD, dry MM, NG tube in place with tube feeds infusing Lungs: Diminished breath sounds throughout, even, nonlabored Cardiovascular: Sinus tachycardia, no m/r/g, 2+ radial/1+ distal, no edema  Abdomen: Soft, nontender, nondistended, no guarding or rebound tenderness, BS+x4 Extremities: Generalized weakness, Normal bulk and tone, no deformities, trace edema to BLE Neuro: Awake and alert, oriented only to self and place, moves all extremities to commands, no focal deficits, speech very hoarse GU: External male catheter in place  Resolved Hospital Problem list     Assessment & Plan:   #Acute toxic metabolic encephalopathy  #Possible seizure activity ~ RESOLVED -Treatment of metabolic derangements as outlined below -Provide supportive care -Promote normal sleep/wake cycle and family presence -Avoid sedating  medications as able -Keppra  was discontinued -Seizure precautions   #Mildly elevated troponin secondary to demand ischemia  #Hypotension suspect secondary to sedating medication ~ RESOLVED Hx: HLD and DVT on Eliquis  Echo 09/14/23: 50 to 55%, indeterminate diastolic parameters - Continuous telemetry monitoring  - Troponin peaked at 115 - Prn levophed  gtt to maintain map 65 or higher ~ not requiring - Continue Eliquis  - Cardiology signed off no further cardiac evaluations indicated at this time   #Acute respiratory failure #AECOPD ~ TREATED #RSV Pneumonia ~ TREATED #Questionable superimposed HAP -Supplemental O2 as needed to maintain O2 sats 88 to 92% -BiPAP if needed (pt is DNR/DNI) -Follow intermittent Chest X-ray & ABG as needed -Bronchodilators  -Continue Prednisone  taper -ABX as above -Diuresis as BP and renal function permits ~ give 20 mg IV Lasix  x1 dose 2/4 -Pulmonary toilet as able  #Mild Hyperkalemia ~ RESOLVED #Hyponatremia ~ IMPROVED #Hypophosphatemia ~ RESOLVED -Monitor I&O's / urinary output -Follow BMP -Ensure adequate renal perfusion -Avoid nephrotoxic agents as able -Replace electrolytes as indicated ~ Pharmacy following for assistance with electrolyte replacement  #Anemia without obvious signs of acute blood loss  -Monitor for S/Sx of bleeding -Trend CBC -Eliquis  for AC/VTE Prophylaxis  -Transfuse for Hgb <7  #Type II diabetes mellitus  #Hypoglycemia~resolved - CBG's q4hrs  - Continue resistance SSI and will start scheduled novolog  4 units q4hrs if TF's infusing  - Diabetes coordinator consulted appreciate input  - Follow hypo/hyperglycemic protocol  - Target CBG's 140 to 180       Best Practice (right click and Reselect all SmartList Selections daily)   Diet/type: TF's DVT prophylaxis: Eliquis   Pressure ulcer(s): N/A GI prophylaxis: PPI Lines: RUE PICC Line and still needed ~ will attempt to remove 2/4 if able to obtain peripheral  access Foley:  N/A Code Status:  DNR/DNI Last date of multidisciplinary goals of care discussion [10/07/2023]  02/04: Pt updated at bedside on plan of care, Will update pts family today when they arrive at bedside.  Labs   CBC: Recent Labs  Lab 10/02/23 0443 10/03/23 0503 10/04/23 0431 10/06/23 0557 10/07/23 0449  WBC 8.7 12.4* 15.6* 13.8* 10.0  HGB 8.6* 10.1* 10.0* 8.2* 8.7*  HCT 26.5* 31.8* 31.3* 26.0* 26.7*  MCV 93.6 96.1 94.8 96.7 91.4  PLT 324 406* 353 335 412*    Basic Metabolic Panel: Recent Labs  Lab 10/04/23 0431 10/05/23 0513 10/06/23 0557 10/06/23 1659 10/07/23 0449  NA 139 139 138 138 136  K 3.3* 4.1 3.5 4.4 3.8  CL 102 104 102 100 98  CO2 25 26 26 25 29   GLUCOSE 113* 136* 93 134* 137*  BUN 14 20 13 12 13   CREATININE 0.39* 0.48* 0.36* 0.40* 0.44*  CALCIUM 8.1* 8.3* 7.6* 7.7* 7.9*  MG 1.7 2.1 1.4* 2.6* 2.2  PHOS 2.0* 2.6 2.2* 3.4 2.2*   GFR: Estimated Creatinine Clearance: 76 mL/min (A) (by C-G formula based on SCr of 0.44 mg/dL (L)). Recent Labs  Lab 10/03/23 0503 10/04/23 0431 10/06/23 0557 10/07/23 0449  WBC 12.4* 15.6* 13.8* 10.0    Liver Function Tests: Recent Labs  Lab 10/01/23 0502 10/06/23 0557  ALBUMIN  2.0* 1.6*   No results for input(s): LIPASE, AMYLASE in the last 168 hours. No results for input(s): AMMONIA in the last 168 hours.  ABG    Component Value Date/Time   PHART 7.43 09/20/2023 2332   PCO2ART 62 (H) 09/20/2023 2332   PO2ART 53 (L) 09/20/2023 2332   HCO3 37.1 (H) 09/30/2023 0843   O2SAT 72.6 09/30/2023 0843     Coagulation Profile: No results for input(s): INR, PROTIME in the last 168 hours.   Cardiac Enzymes: No results for input(s): CKTOTAL, CKMB, CKMBINDEX, TROPONINI in the last 168 hours.  HbA1C: Hgb A1c MFr Bld  Date/Time Value Ref Range Status  09/15/2023 09:24 PM 6.4 (H) 4.8 - 5.6 % Final    Comment:    (NOTE) Pre diabetes:          5.7%-6.4%  Diabetes:               >6.4%  Glycemic control for   <7.0% adults with diabetes     CBG: Recent Labs  Lab 10/06/23 1625 10/06/23 1921 10/06/23 2356 10/07/23 0317 10/07/23 0727  GLUCAP 124* 128* 125* 141* 177*    Review of Systems:   Unable to assess due to AMS  Past Medical History:  He,  has a past medical history of COPD (chronic obstructive pulmonary disease) (HCC), DVT (deep venous thrombosis) (HCC), Dyspnea, GERD (gastroesophageal reflux disease), HLD (hyperlipidemia), migraines, Oxygen dependent, Pneumonia (2023), Pre-diabetes, Pulmonary nodule, Rotator cuff tear, right, Tobacco abuse, and Tobacco use.   Surgical History:   Past Surgical History:  Procedure Laterality Date   CHEST TUBE INSERTION     SHOULDER ARTHROSCOPY WITH SUBACROMIAL DECOMPRESSION, ROTATOR CUFF REPAIR AND BICEP TENDON REPAIR Right 02/18/2023   Procedure: RIGHT SHOULDER ARTHROSCOPY WITH DEBRIDEMENT, DECOMPRESSION, ROTATOR CUFF REPAIR, BICEPS TENODESIS.;  Surgeon: Edie Norleen PARAS, MD;  Location: [facility] ORS;  Service: Orthopedics;  Laterality: Right;     Social History:   reports that he has been smoking cigarettes. He has a 12 pack-year smoking history. He has never used smokeless tobacco. He reports current alcohol  use. He reports that he does not use drugs.   Family History:  His family history  is not on file.   Allergies No Active Allergies   Home Medications  Prior to Admission medications   Medication Sig Start Date End Date Taking? Authorizing Provider  albuterol  (PROVENTIL ) (2.5 MG/3ML) 0.083% nebulizer solution USE 1 VIAL IN NEBULIZER EVERY 6 HOURS AS NEEDED FOR WHEEZING 06/28/21  Yes [provider]  albuterol  (VENTOLIN  HFA) 108 (90 Base) MCG/ACT inhaler Inhale 1 puff into the lungs every 4 (four) hours as needed for wheezing or shortness of breath.   Yes [provider]  APIXABAN  (ELIQUIS ) VTE STARTER PACK (10MG  AND 5MG ) Take as directed on package: start with two-5mg  tablets twice daily for 7  days. On day 8, switch to one-5mg  tablet twice daily. 02/26/23  Yes Bradler, Evan K, MD  ondansetron  (ZOFRAN ) 4 MG tablet Take 1 tablet (4 mg total) by mouth every 6 (six) hours as needed for nausea. 02/19/23  Yes Kip Lynwood Double, PA-C  oxyCODONE  (OXY IR/ROXICODONE ) 5 MG immediate release tablet Take 1 tablet by mouth 2 (two) times daily as needed. 08/29/23  Yes [provider]  OXYGEN Inhale 3 L into the lungs continuous.   Yes [provider]  predniSONE  (DELTASONE ) 10 MG tablet Take 2-6 tablets by mouth daily with breakfast.  ORIGINAL DPH:ujxz 6 tabs daily x4 days; then 4 tabs daily x4 days; then 2 tabs daily x4 days.   Yes [provider]  predniSONE  (DELTASONE ) 20 MG tablet Take 20 mg by mouth daily with breakfast.   Yes [provider]  TRELEGY ELLIPTA 100-62.5-25 MCG/ACT AEPB Inhale 1 puff into the lungs every morning. 07/04/21  Yes [provider]  amoxicillin-clavulanate (AUGMENTIN) 875-125 MG tablet Take 1 tablet by mouth every 12 (twelve) hours. Patient not taking: Reported on 09/10/2023    [provider]  benralizumab (FASENRA PEN) 30 MG/ML prefilled autoinjector Inject 30 mg into the skin as directed. Patient not taking: Reported on 09/10/2023 07/23/23   [provider]  nystatin (MYCOSTATIN) 100000 UNIT/ML suspension Take 5 mLs by mouth 4 (four) times daily. Patient not taking: Reported on 09/10/2023 09/04/23   [provider]  omeprazole  (PRILOSEC ) 20 MG capsule Take 20 mg by mouth daily. Patient not taking: Reported on 09/10/2023    [provider]  oxyCODONE  (OXY IR/ROXICODONE ) 5 MG immediate release tablet Take 1-2 tablets (5-10 mg total) by mouth every 4 (four) hours as needed for severe pain. 02/19/23   Kip Lynwood Double, PA-C     Critical care time: 40 minutes      Inge Lecher, AGACNP-BC  Pulmonary & Critical Care Prefer epic messenger for cross cover needs If after hours, please call  E-link

## 2023-10-07 NOTE — Progress Notes (Signed)
 PHARMACY CONSULT NOTE  Pharmacy Consult for Electrolyte Monitoring and Replacement   Recent Labs: Potassium (mmol/L)  Date Value  10/07/2023 3.8   Magnesium  (mg/dL)  Date Value  97/95/7974 2.2   Calcium (mg/dL)  Date Value  97/95/7974 7.9 (L)   Albumin  (g/dL)  Date Value  97/96/7974 1.6 (L)   Phosphorus (mg/dL)  Date Value  97/95/7974 2.2 (L)   Sodium (mmol/L)  Date Value  10/07/2023 136   Assessment: 63 y/o male with h/o COPD, DVT, HLD, GERD, pulmonary nodule and DM who is admitted with RSV, pneumonia and COPD exacerbation. Pharmacy is asked to follow and replace electrolytes while in CCU.  NGT placed 2/2 and pt started on tube feeds. He is re-feeding  Goal of Therapy:  Electrolytes WNL  Plan:  --K 3.8, Kcl 20 mEq per tube x 1 dose --Phos 2.2, Phos-Nak 1 packet per tube x 3 doses --Re-check electrolytes in AM tomorrow  Marolyn KATHEE Mare 10/07/2023 7:43 AM

## 2023-10-07 NOTE — Progress Notes (Signed)
 Physical Therapy Treatment Patient Details Name: Benjamin Bolton MRN: [id number] DOB: 18-Jan-1961 Today's Date: 10/07/2023   History of Present Illness 63 y.o. 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    PT Comments  Patient is making slow progress with functional independence. Discharge recommendations updated this session. The patient continues to require assistance with bed mobility. Poor sitting balance with posterior lean. Patient unwilling to try standing despite encouragement and facilitation for anterior weight shifting and weight bearing through BLE. Recommend rehabilitation < 3 hours/day after this hospital stay.    If plan is discharge home, recommend the following: A lot of help with walking and/or transfers;A lot of help with bathing/dressing/bathroom;Assist for transportation;Assistance with cooking/housework;Help with stairs or ramp for entrance   Can travel by private vehicle     No  Equipment Recommendations   (to be determined at next level of care)    Recommendations for Other Services       Precautions / Restrictions Precautions Precautions: Fall Restrictions Weight Bearing Restrictions Per Provider Order: No     Mobility  Bed Mobility Overal bed mobility: Needs Assistance Bed Mobility: Sit to Supine, Supine to Sit, Rolling     Supine to sit: Mod assist Sit to supine: Max assist   General bed mobility comments: verbal cues for technique    Transfers                   General transfer comment: patient declined despite faciliation for anterior weight shifting. no initiation provided by the patient    Ambulation/Gait                   Stairs             Wheelchair Mobility     Tilt Bed    Modified  Rankin (Stroke Patients Only)       Balance Overall balance assessment: Needs assistance Sitting-balance support: Feet supported Sitting balance-Leahy Scale: Poor Sitting balance - Comments: Min A- Mod A required to maintain sitting balance. patient was looking more to the right side today with less pronounced head turn to the left Postural control: Posterior lean                                  Cognition Arousal: Alert, Lethargic Behavior During Therapy: Flat affect Overall Cognitive Status: Impaired/Different from baseline Area of Impairment: Safety/judgement, Following commands, Orientation                 Orientation Level: Disoriented to, Place, Time     Following Commands: Follows one step commands with increased time Safety/Judgement: Decreased awareness of safety, Decreased awareness of deficits     General Comments: very low tone, difficut to understand at times. needs encouragement        Exercises      General Comments General comments (skin integrity, edema, etc.): no significant change in vitals noted with activity      Pertinent Vitals/Pain Pain Assessment Pain Assessment: No/denies pain    Home Living                          Prior Function  PT Goals (current goals can now be found in the care plan section) Acute Rehab PT Goals Patient Stated Goal: none stated PT Goal Formulation: With patient Time For Goal Achievement: 10/14/23 Potential to Achieve Goals: Fair Progress towards PT goals: Progressing toward goals    Frequency    Min 1X/week      PT Plan      Co-evaluation              AM-PAC PT 6 Clicks Mobility   Outcome Measure  Help needed turning from your back to your side while in a flat bed without using bedrails?: A Little Help needed moving from lying on your back to sitting on the side of a flat bed without using bedrails?: A Lot Help needed moving to and from a bed to a  chair (including a wheelchair)?: A Lot Help needed standing up from a chair using your arms (e.g., wheelchair or bedside chair)?: A Lot Help needed to walk in hospital room?: Total Help needed climbing 3-5 steps with a railing? : Total 6 Click Score: 11    End of Session   Activity Tolerance: Patient limited by fatigue Patient left: in bed;with call bell/phone within reach;with bed alarm set Nurse Communication: Mobility status PT Visit Diagnosis: Unsteadiness on feet (R26.81);Muscle weakness (generalized) (M62.81);Difficulty in walking, not elsewhere classified (R26.2)     Time: 9145-9089 PT Time Calculation (min) (ACUTE ONLY): 16 min  Charges:    $Therapeutic Activity: 8-22 mins PT General Charges $$ ACUTE PT VISIT: 1 Visit                     Benjamin Bolton, PT, MPT    Benjamin Bolton 10/07/2023, 12:52 PM

## 2023-10-07 NOTE — Progress Notes (Signed)
 Inpatient Rehab Admissions Coordinator:   Pt continues to struggle with active participation in care.  Therapy now recommending SNF, which I agree is more appropriate.  We will sign off for CIR.   Reche Lowers, PT, DPT Admissions Coordinator [phone] 10/07/23  2:07 PM

## 2023-10-08 DIAGNOSIS — J9621 Acute and chronic respiratory failure with hypoxia: Secondary | ICD-10-CM | POA: Diagnosis not present

## 2023-10-08 DIAGNOSIS — R7989 Other specified abnormal findings of blood chemistry: Secondary | ICD-10-CM

## 2023-10-08 DIAGNOSIS — E43 Unspecified severe protein-calorie malnutrition: Secondary | ICD-10-CM | POA: Diagnosis not present

## 2023-10-08 DIAGNOSIS — Z515 Encounter for palliative care: Secondary | ICD-10-CM | POA: Diagnosis not present

## 2023-10-08 DIAGNOSIS — E878 Other disorders of electrolyte and fluid balance, not elsewhere classified: Secondary | ICD-10-CM

## 2023-10-08 DIAGNOSIS — D649 Anemia, unspecified: Secondary | ICD-10-CM

## 2023-10-08 DIAGNOSIS — R7303 Prediabetes: Secondary | ICD-10-CM

## 2023-10-08 DIAGNOSIS — I824Y9 Acute embolism and thrombosis of unspecified deep veins of unspecified proximal lower extremity: Secondary | ICD-10-CM | POA: Diagnosis not present

## 2023-10-08 DIAGNOSIS — J441 Chronic obstructive pulmonary disease with (acute) exacerbation: Secondary | ICD-10-CM | POA: Diagnosis not present

## 2023-10-08 LAB — CBC
HCT: 27.1 % — ABNORMAL LOW (ref 39.0–52.0)
Hemoglobin: 8.7 g/dL — ABNORMAL LOW (ref 13.0–17.0)
MCH: 29.6 pg (ref 26.0–34.0)
MCHC: 32.1 g/dL (ref 30.0–36.0)
MCV: 92.2 fL (ref 80.0–100.0)
Platelets: 428 10*3/uL — ABNORMAL HIGH (ref 150–400)
RBC: 2.94 MIL/uL — ABNORMAL LOW (ref 4.22–5.81)
RDW: 14.8 % (ref 11.5–15.5)
WBC: 12.5 10*3/uL — ABNORMAL HIGH (ref 4.0–10.5)
nRBC: 0 % (ref 0.0–0.2)

## 2023-10-08 LAB — BASIC METABOLIC PANEL
Anion gap: 6 (ref 5–15)
BUN: 11 mg/dL (ref 8–23)
CO2: 30 mmol/L (ref 22–32)
Calcium: 8.2 mg/dL — ABNORMAL LOW (ref 8.9–10.3)
Chloride: 98 mmol/L (ref 98–111)
Creatinine, Ser: 0.47 mg/dL — ABNORMAL LOW (ref 0.61–1.24)
GFR, Estimated: >60 mL/min (ref >60)
Glucose, Bld: 143 mg/dL — ABNORMAL HIGH (ref 70–99)
Potassium: 3.9 mmol/L (ref 3.5–5.1)
Sodium: 134 mmol/L — ABNORMAL LOW (ref 135–145)

## 2023-10-08 LAB — IRON AND TIBC
Iron: 21 ug/dL — ABNORMAL LOW (ref 45–182)
Saturation Ratios: 13 % — ABNORMAL LOW (ref 17.9–39.5)
TIBC: 167 ug/dL — ABNORMAL LOW (ref 250–450)
UIBC: 146 ug/dL

## 2023-10-08 LAB — RETICULOCYTES
Immature Retic Fract: 15.6 % (ref 2.3–15.9)
RBC.: 2.98 MIL/uL — ABNORMAL LOW (ref 4.22–5.81)
Retic Count, Absolute: 51 10*3/uL (ref 19.0–186.0)
Retic Ct Pct: 1.7 % (ref 0.4–3.1)

## 2023-10-08 LAB — GLUCOSE, CAPILLARY
Glucose-Capillary: 128 mg/dL — ABNORMAL HIGH (ref 70–99)
Glucose-Capillary: 149 mg/dL — ABNORMAL HIGH (ref 70–99)
Glucose-Capillary: 242 mg/dL — ABNORMAL HIGH (ref 70–99)
Glucose-Capillary: 232 mg/dL — ABNORMAL HIGH (ref 70–99)
Glucose-Capillary: 120 mg/dL — ABNORMAL HIGH (ref 70–99)
Glucose-Capillary: 143 mg/dL — ABNORMAL HIGH (ref 70–99)

## 2023-10-08 LAB — C-REACTIVE PROTEIN: CRP: 7.6 mg/dL — ABNORMAL HIGH (ref <1.0)

## 2023-10-08 LAB — FERRITIN: Ferritin: 365 ng/mL — ABNORMAL HIGH (ref 24–336)

## 2023-10-08 LAB — FOLATE: Folate: 9.3 ng/mL (ref >5.9)

## 2023-10-08 LAB — VITAMIN B12: Vitamin B-12: 877 pg/mL (ref 180–914)

## 2023-10-08 LAB — MAGNESIUM: Magnesium: 2 mg/dL (ref 1.7–2.4)

## 2023-10-08 LAB — PHOSPHORUS: Phosphorus: 2.5 mg/dL (ref 2.5–4.6)

## 2023-10-08 MED ORDER — MEDIHONEY WOUND/BURN DRESSING EX PSTE
1.0000 | PASTE | Freq: Every day | CUTANEOUS | Status: DC
  Administered 2023-10-08 – 2023-10-29 (×22): 1 via TOPICAL
  Filled 2023-10-08: qty 44

## 2023-10-08 MED ORDER — IPRATROPIUM-ALBUTEROL 0.5-2.5 (3) MG/3ML IN SOLN
3.0000 mL | Freq: Three times a day (TID) | RESPIRATORY_TRACT | Status: DC
  Administered 2023-10-08 – 2023-10-14 (×15): 3 mL via RESPIRATORY_TRACT
  Filled 2023-10-08 (×20): qty 3

## 2023-10-08 NOTE — Care Management Important Message (Signed)
 Important Message  Patient Details  Name: Benjamin Bolton MRN: [id number] Date of Birth: 12-26-1960   Important Message Given:  Yes - Medicare IM     Felix Host 10/08/2023, 2:36 PM

## 2023-10-08 NOTE — Assessment & Plan Note (Signed)
 -  Patient has history of DVT   -Continue Eliquis

## 2023-10-08 NOTE — Consult Note (Signed)
 WOC Nurse Consult Note: Reason for Consult: sacral wound  Wound type: Unstageable PI sacrum  Pressure Injury POA: no  Measurement: 4 cm x 2 cm  Wound bed: 60% tan slough 40% pink moist  Drainage (amount, consistency, odor) minimal tan exudate  Periwound: intact  Dressing procedure/placement/frequency: Cleanse sacral wound with NS, apply Medihoney to wound bed daily, cover with dry gauze and silicone foam. May lift foam daily to replace Medihoney.  Change foam q3 days and prn soiling.   POC discussed with bedside nurse. WOC team will follow every 7-10 days for reassessment and to change POC as needed.   Thank you,    Powell Bar MSN, RN-BC, [employer] [phone]

## 2023-10-08 NOTE — Assessment & Plan Note (Signed)
 A1c of 6.4, CBG currently elevated. Patient is on tube feed and steroid. -Continue with SSI

## 2023-10-08 NOTE — Assessment & Plan Note (Addendum)
 COPD exacerbation-treated and resolved RSV pneumonia-treated Concern of superimposed HAP. S/p prolonged intubation with advanced underlying COPD. Currently saturating well on 2 L of oxygen which is his baseline. Postintubation muffled voice and weak cough. Currently on Zosyn  for for concern of HAP although tracheal cultures with normal respiratory flora.  Inflammatory markers seems improving. -Continue with Zosyn  to complete the course -Continue supplemental oxygen -Incentive spirometry and flutter valve -Chest PT -Continue with supportive care

## 2023-10-08 NOTE — Assessment & Plan Note (Signed)
-  Continue with prednisone  taper

## 2023-10-08 NOTE — Discharge Instructions (Signed)
 Rent/Utility/Housing  Agency Name: [facility] Agency Address: [address] [place], [place] [address] Phone: [phone] Email: [email or web address] Website: [email or web address] Service(s) Offered: Housing services, self-sufficiency, congregate meal program, weatherization program, Field seismologist program, emergency food assistance,  housing counseling, home ownership program, wheels -towork program.  Agency Name: [facility] Address: [address] [place], [place] [address] Phone: [phone] (8a-4p) [phone] (8p- 10p) Email: [email or web address] Website: [email or web address] Service(s) Offered: A program for homeless and/or needy men that includes one-on-one counseling, life skills training and job rehabilitation.  Agency Name: [facility] Address: [address] [place], [place] [address] Phone: [phone] Website: www.alliedchurches.org Service(s) Offered: Assistance to needy in emergency with utility bills, heating fuel, and prescriptions. Shelter for homeless 7pm-7am. December 26, 2016 15  Agency Name: [facility] [place] [facility] Dis[facility] Address: [address], [place], [place] [address] Phone: [phone] Contact Person: Cathleen Corti Email: [email or web address] Website: [email or web address].[email or web address] Service(s) Offered: Helps individuals with developmental disabilities move from housing that is more restrictive to homes where they  can achieve greater independence and have more  opportunities.  Agency Name: [facility] Address: [address], [place], [place] [address] Phone: [phone] Email: [email or web address] Website: [email or web address].org Service(s) Offered: Provides affordable housing for low-income families, elderly, and disabled individuals. Offer a wide range of  programs and services, from financial planning to  afterschool and summer programs.  Agency Name: [facility] Address: [address] [place], [place] [address] Phone: [phone] Service(s) Offered: Child support services; child welfare services; food stamps; Medicaid; work first family assistance; and aid with fuel,  rent, food and medicine.  Agency Name: [facility] [place] [employer] Address: [facility] [address], [place], [place]  [address] Phone: [phone] Website: www.familyabuseservices.org Service(s) Offered: 24 hour Crisis Line: [phone]; 24 hour Emergency Shelter; Transitional Housing; Support Groups; Scientist, physiological; [employer]; Hispanic Outreach: [phone];  Visitation Center: [phone].  Agency Name: [facility] [place]. Address: [address] [place], [place] [address] Phone: [phone] Service(s) Offered: CAP Services; Home and [employer]; Individual or Group Supports; Respite Care Non-Institutional Nursing;  Residential Supports; Respite Care and Personal Care Services; Transportation; Family and Friends Night; Recreational Activities; Three Nutritious Meals/Snacks; Consultation with Registered Dietician; Twenty-four hour Registered Nurse Access; Daily and [employer]; Camp [employer] Leaves; [place] for the [employer] (During Summer Months) Bingo Night (Every  Wednesday Night); Special Populations Dance Night  (Every Tuesday Night); Professional Hair Care Services.  Agency Name: [facility] Address: [address], [place], [place] [address] Phone: [phone] Contact Person: Jabier Mutton Website: [email or web address].Physicist, medical) Offered: Residential treatment facility for women; food and  clothing, educational & employment development and  transportation to work; Counsellor of financial skills;  parenting and family reunification; emotional and spiritual  support;  transitional housing for program graduates.  Agency Name: [facility] Address: [address] [place], [place] [address] Phone: [phone] Email: [email or web address] Website: [email or web address].es Service(s) Offered: Public housing units for elderly, disabled, and low income people; housing choice vouchers for income eligible  applicants; shelter plus care vouchers; and Psychologist, clinical.  Agency Name: [facility] [employer] Address: [address] [place], [place] [address] Phone: [phone] Email: [email or web address] Website: www.habitatalamance.org Service(s) Offered: Build houses for families in need of decent housing. Each adult in the family must invest 200 hours of labor on  someone else's house, work with volunteers to build their own house, attend classes  on budgeting, home maintenance, yard care, and attend homeowner association meetings.  Agency Name: [facility] Address: [address], [place], [place] [address] Phone: [phone] Website: www.rsli.org Service(s) Offered: Intermediate care facilities for intellectually delayed, Supervised Living in group homes for adults with developmental disabilities, Supervised Living for people who have dual diagnoses (MRMI), Independent Living, Supported Living, respite and a variety of CAP services, pre-vocational services, day supports, and [employer].  Agency Name: [place] [employer] Phone: [phone] Website: www.NCForeclosurePrevention.gov Service(s) Offered: Zero-interest, deferred loans to homeowners struggling to pay their mortgage. Call for more information.

## 2023-10-08 NOTE — Progress Notes (Signed)
 Nutrition Follow-up  DOCUMENTATION CODES:   Severe malnutrition in context of acute illness/injury  INTERVENTION:   -TF via NGT:   Continue Osmolite 1.5 @ 30 ml/hr and increase by 10 ml every 8 hours to goal rate of 60 ml/hr.   60 ml Prosource TF daily  30 ml free water  flush every 4 hours  Tube feeding regimen provides 2240 kcal (100% of needs), 110 grams of protein, and 1097 ml of H2O.  Total free water : 1277 ml daily  -Continue Vitamin C  500mg  BID via tube -Continue Thiamine  100mg  dialy via tube x 7 days  -Continue Juven Fruit Punch BID via tube, each serving provides 95kcal and 2.5g of protein (amino acids glutamine and arginine) -Pt at high refeed risk; recommend monitor potassium, magnesium  and phosphorus labs daily until stable -Continue daily weights  -Pharmacy consult for electrolyte management  -If pt desires aggressive care and this aligns with goals of care, may need to consider permanent feeding acces (ex PEG) pending GOC discussions  NUTRITION DIAGNOSIS:   Severe Malnutrition related to acute illness as evidenced by moderate fat depletion, severe fat depletion, moderate muscle depletion, severe muscle depletion.  Ongoing  GOAL:   Patient will meet greater than or equal to 90% of their needs  Progressing   MONITOR:   Diet advancement, Labs, Weight trends, TF tolerance, Skin, I & O's  REASON FOR ASSESSMENT:   Consult Enteral/tube feeding initiation and management  ASSESSMENT:   63 y/o male with h/o COPD, DVT, HLD, GERD, pulmonary nodule and DM who is admitted with RSV, pneumonia and COPD exacerbation.  1/27- extubated  1/29- s/p BSE- advanced to dysphagia 1 diet with thin liquids 2/3- NGT placed, TF started  Reviewed I/O's: -1.2 L x 24 hours and -516 ml since 09/24/23  UOP: 1.6 Lx 24 hours  Pt lying up in bed at time of visit. He was somnolent and did not arouse to name being called no family at bedside,   Noted breakfast tray on table,  unattempted. Meal completions 0%. Noted pt has been refusing oral intake.   Per [facility] on 10/06/23, tip og NGT in stomach. Osmolite 1.5 current infusing from NGT at 30 ml/hr.   Case discussed with RN and MD regarding TF regimen. Per RN, pt is scheduled to increase to 40 ml/hr this AM and plans to do this after RD visit. Reviewed and clarified titration orders. Discussed concern for refeeding risk (electrolytes WDL today). RD received permission to place pharmacy consult for electrolyte management.   Reviewed wt hx; pt has experienced a 11.7^% wt loss over the past month, which is significant for time frame. Pt identified with severe malnutrition in the setting of acute illness on 10/06/23.   Palliative care following for goals of care discussions. Pt currently DNR with limited interventions.   Per TOC notes, pt family amenable to SNF placement.   Medications reviewed and include vitamin C , protonix , prednisone , and thiamine .   Labs reviewed: Na: 134, K, Mg, and Phos WDL. CBGS: 128-247 (inpatient orders for glycemic control are 0-9 units insulin  aspart every 4 hours).    Diet Order:   Diet Order             DIET - DYS 1 Room service appropriate? Yes with Assist; Fluid consistency: Thin  Diet effective now                   EDUCATION NEEDS:   Education needs have been addressed  Skin:  Skin Assessment: Skin  Integrity Issues: Skin Integrity Issues:: Stage II Stage II: sacrum Other: IAD to buttocks  Last BM:  10/07/23 (type 6)  Height:   Ht Readings from Last 1 Encounters:  09/10/23 6' (1.829 m)    Weight:   Wt Readings from Last 1 Encounters:  10/08/23 53.7 kg    Ideal Body Weight:  80.9 kg  BMI:  Body mass index is 16.06 kg/m.  Estimated Nutritional Needs:   Kcal:  1900-2200kcal/day  Protein:  95-110g/day  Fluid:  1.8-2.1L/day    Margery ORN, RD, LDN, CDCES Registered Dietitian III Certified Diabetes Care and Education Specialist If unable to reach this RD,  please use RD Inpatient group chat on secure chat between hours of 8am-4 pm daily

## 2023-10-08 NOTE — Progress Notes (Signed)
 Physical Therapy Treatment Patient Details Name: Gray Doering MRN: [id number] DOB: March 30, 1961 Today's Date: 10/08/2023   History of Present Illness 63 y.o. 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    PT Comments  Patient required maximal encouragement to participate this session and was resistive at times with mobility efforts. He needed increased assistance today with bed mobility. Sitting balance remains poor with external support required. Partial standing bout performed with minimal to no active effort provided by the patient. PT will continue to follow to maximize independence and decrease caregiver burden.    If plan is discharge home, recommend the following: A lot of help with walking and/or transfers;A lot of help with bathing/dressing/bathroom;Assist for transportation;Assistance with cooking/housework;Help with stairs or ramp for entrance   Can travel by private vehicle     No  Equipment Recommendations   (to be determined at next level of care)    Recommendations for Other Services       Precautions / Restrictions Precautions Precautions: Fall Restrictions Weight Bearing Restrictions Per Provider Order: No     Mobility  Bed Mobility Overal bed mobility: Needs Assistance Bed Mobility: Supine to Sit, Sit to Supine, Rolling Rolling: Total assist, +2 for physical assistance   Supine to sit: Total assist, +2 for physical assistance Sit to supine: Total assist, +2 for physical assistance   General bed mobility comments: minimal to no participation today despite maximal encourgaement, extra time provided, cues for sequencing and technique. patient initially resistive to sitting upright    Transfers Overall transfer level: Needs  assistance Equipment used: 2 person hand held assist Transfers: Sit to/from Stand Sit to Stand: Total assist, +2 physical assistance           General transfer comment: partial stand performed with +2 person assistance required. minimal initiation of standing despite faciliation    Ambulation/Gait               General Gait Details: unable to attempt due to poor participation   Stairs             Wheelchair Mobility     Tilt Bed    Modified Rankin (Stroke Patients Only)       Balance Overall balance assessment: Needs assistance Sitting-balance support: Feet supported Sitting balance-Leahy Scale: Poor Sitting balance - Comments: Max A initially progressing to Min-Mod A with increased sitting time and facilitation for midline Postural control: Posterior lean   Standing balance-Leahy Scale: Zero Standing balance comment: significant external support required to perform partial stand/maintain standing balance                            Cognition Arousal: Alert Behavior During Therapy: Flat affect Overall Cognitive Status: Impaired/Different from baseline Area of Impairment: Safety/judgement, Following commands, Orientation                 Orientation Level: Disoriented to, Place, Time     Following Commands: Follows one step commands with increased time Safety/Judgement: Decreased awareness of safety, Decreased awareness of deficits     General Comments: very low tone, difficut to understand at times. needs encouragement        Exercises      General Comments General comments (skin integrity, edema, etc.): ear 02 probe noted  to be broken, replaced with finger probe with good waveform. Sp02 97% at end of session. patient is fatigued with activity with poor activity tolerance. patient positioned towards right side with head in midline position at end of session.      Pertinent Vitals/Pain Pain Assessment Pain Assessment:  Faces Faces Pain Scale: Hurts little more Pain Location: sore throat Pain Descriptors / Indicators: Sore Pain Intervention(s): Limited activity within patient's tolerance, Monitored during session    Home Living                          Prior Function            PT Goals (current goals can now be found in the care plan section) Acute Rehab PT Goals Patient Stated Goal: none stated PT Goal Formulation: With patient Time For Goal Achievement: 10/14/23 Potential to Achieve Goals: Fair Progress towards PT goals: Progressing toward goals    Frequency    Min 1X/week      PT Plan      Co-evaluation PT/OT/SLP Co-Evaluation/Treatment: Yes Reason for Co-Treatment: For patient/therapist safety;To address functional/ADL transfers PT goals addressed during session: Mobility/safety with mobility OT goals addressed during session: ADL's and self-care      AM-PAC PT 6 Clicks Mobility   Outcome Measure  Help needed turning from your back to your side while in a flat bed without using bedrails?: A Little Help needed moving from lying on your back to sitting on the side of a flat bed without using bedrails?: A Lot Help needed moving to and from a bed to a chair (including a wheelchair)?: Total Help needed standing up from a chair using your arms (e.g., wheelchair or bedside chair)?: Total Help needed to walk in hospital room?: Total Help needed climbing 3-5 steps with a railing? : Total 6 Click Score: 9    End of Session   Activity Tolerance: Patient limited by fatigue Patient left: in bed;with call bell/phone within reach;with bed alarm set Nurse Communication: Mobility status PT Visit Diagnosis: Unsteadiness on feet (R26.81);Muscle weakness (generalized) (M62.81);Difficulty in walking, not elsewhere classified (R26.2)     Time: 8987-8955 PT Time Calculation (min) (ACUTE ONLY): 32 min  Charges:    $Therapeutic Activity: 8-22 mins PT General Charges $$ ACUTE  PT VISIT: 1 Visit                     Randine Essex, PT, MPT   [name] 10/08/2023, 12:25 PM

## 2023-10-08 NOTE — Consult Note (Addendum)
 PHARMACY CONSULT NOTE - FOLLOW UP  Pharmacy Consult for Electrolyte Monitoring and Replacement   Recent Labs: Potassium (mmol/L)  Date Value  10/08/2023 3.9   Magnesium  (mg/dL)  Date Value  97/94/7974 2.0   Calcium (mg/dL)  Date Value  97/94/7974 8.2 (L)   Albumin  (g/dL)  Date Value  97/96/7974 1.6 (L)   Phosphorus (mg/dL)  Date Value  97/94/7974 2.5   Sodium (mmol/L)  Date Value  10/08/2023 134 (L)     Assessment: 63 year old male with severe COPD admitted with acute hypoxic respiratory failure secondary to RSV pneumonia requiring intubation. Extubated to BiPAP on 1/27 with improvement in respiratory status but persistent encephalopathy. Severe malnutrition in context of acute illness/injury. On Osmolite 1.5 @ 30 ml/hr and increase by 10 ml every 8 hours to goal rate of 60 ml/hr, 60 ml Prosource TF daily, 30 ml free water  flush every 4 hours. Na trending down, continue to monitor.   Goal of Therapy:  WNL  Plan:  No replacement needed.  F/u with AM labs.   Cathaleen GORMAN Blanch ,PharmD Clinical Pharmacist 10/08/2023 10:47 AM

## 2023-10-08 NOTE — Progress Notes (Signed)
 Progress Note   [name] FMW:[id number] DOB: 01/10/1961 DOA: 09/10/2023     28 DOS: the patient was seen and examined on 10/08/2023   Brief hospital course: ICU transfer for 10/08/2023.  Taken from prior notes.  63 y.o. 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  Was successfully extubated on 1/27, currently on his baseline 3L nasal cannula, not requiring vasopressors.  Hospital course has been complicated questionable HAP and metabolic encephalopathy, which has limited his ability to participate with PT.  While he was Lucid after extubation, did make himself DNR/DNI in presence of family and chaplain.  He will remain full scope of medical care.  . Patient is chronically maintained on 20 mg of prednisone  which will continue for now but will require a prolonged taper. He is refusing certain aspects of his care which is challenging given his overall comorbidities.   Initial PT recommendations for CIR which were downgraded to SNF due to his ability to participate with physical therapy.  Might need LTAC placement.  Echocardiogram done on 09/14/2023 with low normal EF and indeterminate diastolic parameters.  Troponin peaked at 115.  Did receive pressors intermittently while in ICU.  Cardiology was consulted which later signed off and no further cardiac evaluation indicated at this time.  There was a questionable superimposed HAP which was treated with antibiotics.  Multiple electrolyte abnormalities which include mild hyperkalemia, hyponatremia and hypophosphatemia which were resolved with repletion.  2/5: Vital stable with borderline soft blood pressure, saturating 100% on 2 L of oxygen, temperature of 100.4 recorded over the past 24-hour.  Labs with slight  worsening of leukocytosis at 12.5, rest of the labs stable, CRP improving, CBG elevated. Sputum cultures from 1/22 with normal respiratory flora.  Patient is currently on Zosyn . Very muffled voice and weak cough.  Patient is quite debilitated and is appropriate for LTAC as recommended and PCCM note. Patient is on tube feed through NGT as having postintubation dysphagia.  Assessment and Plan: * Acute on chronic respiratory failure with hypoxia (HCC) COPD exacerbation-treated and resolved RSV pneumonia-treated Concern of superimposed HAP. S/p prolonged intubation with advanced underlying COPD. Currently saturating well on 2 L of oxygen which is his baseline. Postintubation muffled voice and weak cough. Currently on Zosyn  for for concern of HAP although tracheal cultures with normal respiratory flora.  Inflammatory markers seems improving. -Continue with Zosyn  to complete the course -Continue supplemental oxygen -Incentive spirometry and flutter valve -Chest PT -Continue with supportive care  COPD exacerbation (HCC) -Continue with prednisone  taper  DVT (deep venous thrombosis) (HCC) Patient has history of DVT. -Continue Eliquis   Protein-calorie malnutrition, severe (HCC) Patient is currently being fed through NG tube.  Having difficulty swallowing after intubation. Swallow team is following and placed recommendations for dysphagia 1 diet. -Continue to monitor -Will continue with NG tube feeding until able to swallow  Electrolyte abnormality Patient had multiple electrolyte abnormalities during this complicated course of hospitalization which involved mild hyperkalemia, hyponatremia and hypophosphatemia which has been improved. -Continue to monitor-replete as needed  Normochromic anemia Hemoglobin currently stable at 8.7.  Likely due to chronic disease. Checking anemia panel for any specific deficiency. -Monitor hemoglobin -Transfuse if below 7  Pre-diabetes A1c of 6.4, CBG  currently elevated. Patient is on tube feed and steroid. -Continue with SSI  Elevated  troponin Echocardiogram done on 09/14/2023 with 50 to 55% EF, indeterminate diastolic parameter.  Likely secondary to demand ischemia.  Troponin peaked at 115 Initially cardiology was consulted and later the signed off, no intervention required.      Subjective: Patient was seen and examined today.  Having difficulty swallowing and managing his own secretions.  Very weak cough and muffled sound.  Physical Exam: Vitals:   10/08/23 0325 10/08/23 0500 10/08/23 0816 10/08/23 1421  BP: (!) 92/52     Pulse: 100     Resp: (!) 25  17   Temp: 98.1 F (36.7 C)  99.1 F (37.3 C)   TempSrc: Oral  Axillary   SpO2: 99%  100% 97%  Weight:  53.7 kg    Height:       General.  Severely malnourished and debilitated gentleman, in no acute distress.  Very weak cough and muffled sound.  NG tube in place. Pulmonary.  Lungs clear bilaterally, normal respiratory effort. CV.  Regular rate and rhythm, no JVD, rub or murmur. Abdomen.  Soft, nontender, nondistended, BS positive. CNS.  Alert and oriented .  No focal neurologic deficit. Extremities.  No edema, no cyanosis, pulses intact and symmetrical.  Data Reviewed: Prior data reviewed  Family Communication: Discussed with sister on phone  Disposition: Status is: Inpatient Remains inpatient appropriate because: Severity of illness  Planned Discharge Destination: LTAC  DVT prophylaxis.  Eliquis  Time spent: 50 minutes  This record has been created using Conservation officer, historic buildings. Errors have been sought and corrected,but may not always be located. Such creation errors do not reflect on the standard of care.   Author: Amaryllis Dare, MD 10/08/2023 3:06 PM  For on call review [email or web address].

## 2023-10-08 NOTE — Progress Notes (Signed)
 2/5 I gave the IMM Letter to the RN assigned to the patient, IMM Letter was given to patient at bedside.

## 2023-10-08 NOTE — Assessment & Plan Note (Signed)
 Echocardiogram done on 09/14/2023 with 50 to 55% EF, indeterminate diastolic parameter.  Likely secondary to demand ischemia.  Troponin peaked at 115 Initially cardiology was consulted and later the signed off, no intervention required.

## 2023-10-08 NOTE — Hospital Course (Addendum)
 ICU transfer for 10/08/2023.  Taken from prior notes.  63 y.o. 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  Was successfully extubated on 1/27, currently on his baseline 3L nasal cannula, not requiring vasopressors.  Hospital course has been complicated questionable HAP and metabolic encephalopathy, which has limited his ability to participate with PT.  While he was Lucid after extubation, did make himself DNR/DNI in presence of family and chaplain.  He will remain full scope of medical care.  . Patient is chronically maintained on 20 mg of prednisone  which will continue for now but will require a prolonged taper. He is refusing certain aspects of his care which is challenging given his overall comorbidities.   Initial PT recommendations for CIR which were downgraded to SNF due to his ability to participate with physical therapy.  Might need LTAC placement.  Echocardiogram done on 09/14/2023 with low normal EF and indeterminate diastolic parameters.  Troponin peaked at 115.  Did receive pressors intermittently while in ICU.  Cardiology was consulted which later signed off and no further cardiac evaluation indicated at this time.  There was a questionable superimposed HAP which was treated with antibiotics.  Multiple electrolyte abnormalities which include mild hyperkalemia, hyponatremia and hypophosphatemia which were resolved with repletion.  2/5: Vital stable with borderline soft blood pressure, saturating 100% on 2 L of oxygen, temperature of 100.4 recorded over the past 24-hour.  Labs with slight worsening of leukocytosis at 12.5, rest of the labs stable, CRP improving, CBG elevated. Sputum cultures from 1/22 with normal respiratory flora.  Patient is currently on  Zosyn . Very muffled voice and weak cough.  Patient is quite debilitated and is appropriate for LTAC as recommended and PCCM note. Patient is on tube feed through NGT as having postintubation dysphagia.  2/6: Hemodynamically and labs seems stable, mild hypophosphatemia at 2.4.  Anemia panel consistent with anemia of chronic disease with low iron  and saturation, ferritin elevated.  [facility], pending [facility] authorization.  Patient is reluctant for PEG tube, stating that improvement in dysphagia and he will like to try more p.o. intake. Small improvement in voice and cough reflex.  Completed the course of Zosyn .  2/7: Hemodynamically stable, slowly improving voice and cough.  Improving CRP.  Pending insurance authorization for LTAC.  Still does not want PEG tube.  2/8: Oxycodone  added at his request due to complaint of generalized bodyaches.  Worsening cough and leukocytosis today, repeating chest x-ray.  Ordered a repeat swallow evaluation. Patient just completed course of Zosyn .  2/9: Overnight quite agitated and unable to sleep.  Ativan  helped.  Complaining of generalized aches and pain, appears to have very poor insight of his condition.  Labs with mild pseudohyponatremia secondary to hyperglycemia.  Per patient no appetite but p.o. intake slowly improving. Repeat chest x-ray with persistent but improving bilateral pneumonia. CRP again started trending up.  Continue to have some diarrhea, ordered CBC as add-on  2/10: LTAC placement was declined by insurance even with peer to peer review.  They want a definitive feeding plan in place.  Patient with poor appetite but able to swallow now.  We will try giving him a break from NG tube, he was on a continuous field.  Might pull the NG tube out and see if he  can take adequate p.o. intake.  If he continued to need tube feeding then PEG will be the best option but patient does not want it.  2/11: Patient became febrile again  overnight, maximum temperature recorded of 102.  CRP again started trending up, slight worsening of leukocytosis, repeat chest x-ray with increasing infiltrate.  Patient recently received multiple courses of antibiotic.  UA does not look infected.  Infectious disease was also consulted.  Blood, sputum and wound cultures ordered.  Starting on Zosyn  Calorie count with p.o. intake started, tube feeding switched to bolus if he is unable to eat p.o. Appeal was made for [facility] decision.  2/12: Maximum temperature recorded over the past 24 hours was 100.2, remained tachycardic, worsening leukocytosis at 14.4, and CRP.  CT chest abdomen and pelvis with continued consolidation in lower lobes bilaterally concerning for pneumonia.  Mild gaseous distention of colon, may reflect mild ileus, repeat respiratory viral panel negative, cultures are pending. Patient agrees to PEG placement, IR was consulted and they are would like to wait until current fever and concern of infection is sorted out.  Patient also need 48 hours of Eliquis  break before placement. LTAC appeal still pending  Patient is high risk for worsening comorbidities and mortality, palliative care was consulted.  2/13: Patient remained febrile, NG tube was removed today so he can try improving his p.o. intake, patient will not decide about PEG tube after p.o. challenge.  ID to decide about whether to add further antibiotics, worsening inflammatory markers.  Patient still not ready for hospice.

## 2023-10-08 NOTE — Assessment & Plan Note (Deleted)
 Patient is currently being fed through NG tube.  Having difficulty swallowing after intubation. Swallow team is following and placed recommendations for dysphagia 1 diet. -Continue to monitor -Will continue with NG tube feeding until able to swallow

## 2023-10-08 NOTE — Progress Notes (Signed)
 Mobility Specialist - Progress Note   10/08/23 1600  Mobility  Activity Stood at bedside  Level of Assistance Maximum assist, patient does 25-49%  Assistive Device Front wheel walker  Activity Response Tolerated well  Mobility visit 1 Mobility     Pt lying in bed upon arrival, utilizing 2L. Pt agreeable to activity and motivated. Completed bed mobility with maxA. VC for foot placement onto floor. STS x3 with maxA from elevated bed; 2nd assist needed to assist with peri-care in standing. Pt able to hold upright position ~ 10 seconds before need to return to seated position. Fatigued with activity. Redirection required throughout session as pt is fixated on finding his keys. Pt left in bed with alarm set, needs in reach. RN notified.   Lennette Seip Mobility Specialist 10/08/23, 4:05 PM

## 2023-10-08 NOTE — Assessment & Plan Note (Signed)
 Patient is currently being fed through NG tube.  Having difficulty swallowing after intubation. Swallow team is following and placed recommendations for dysphagia 1 diet. -Continue to monitor -Will continue with NG tube feeding until able to swallow

## 2023-10-08 NOTE — Assessment & Plan Note (Signed)
 Patient had multiple electrolyte abnormalities during this complicated course of hospitalization which involved mild hyperkalemia, hyponatremia and hypophosphatemia which has been improved. -Continue to monitor-replete as needed

## 2023-10-08 NOTE — Progress Notes (Addendum)
Palliative Care Progress Note, Assessment & Plan   Patient Name: Benjamin Bolton       Date: 10/08/2023 DOB: December 11, 1960  Age: 63 y.o. MRN#: [id number] Attending Physician: Arnetha Courser, MD Primary Care Physician: [facility] Admit Date: 09/10/2023  Subjective: Patient is lying in bed with NG tube in place and leaning to his left side.  Mitts are also in place.  He is awake, alert, and acknowledges my presence.  He is able to make his wishes known.  His voice is weak but he can make cohesive and linear thoughts for me.  No family or friends present during my visit.  HPI: 63 y.o. 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  Patient has been successfully extubated, however has required NG tube for nutritional support.  He remains weak and intermittently compliant with PT/OT/medication regimen compliance.  Patient has been declined potential for admission to [facility] inpatient rehab. Pt may be LTAC appropriate.  Summary of counseling/coordination of care: Extensive chart review completed prior to meeting patient including labs, vital signs, imaging, progress notes, orders, and available advanced directive documents from current and previous encounters.   After reviewing the patient's chart and assessing the patient at bedside, I spoke with patient in regards to symptom management and goals of care.   Symptoms assessed.  Patient says he is "just fine".  He denies pain, discomfort, chest pain, headache, N/V, or other acute issues at this time.  He endorses some difficulty clearing his throat.   Counseled with patient that NG tube is still in his nose and down his throat.  Deep breaths, purposeful swallowing, and suction as needed advised.  I discussed boundaries and goals of care with patient.  He shares that he wants to "keep going".  He endorsed that he is accepting of all offered, available, and appropriate medical interventions to sustain his life but would never want to have cardiopulmonary resuscitation or use of a ventilator is needed. REviewed treating the treatable.  With patient's permission, I spoke with his sister/HCPOA Benjamin Bolton over the phone.  Conveyed above.  Benjamin Bolton was relieved to know that patient was able to speak for himself and make his wishes known.  We discussed that DNR with limited interventions remains and that plan remains to treat the treatable.  She was understanding and appreciative.  I shared with both patient and Benjamin Bolton that PMT will step back from daily visits.  We will monitor him peripherally.  We will intervene if we see acute palliative needs.  Please reengage with PMT if goals change, at patient/family's request, or patient's health deteriorates during hospitalization.  Physical Exam Vitals reviewed.  Constitutional:      General: He is not in acute distress. HENT:     Head: Normocephalic.  Eyes:     Pupils: Pupils are equal, round, and reactive to light.  Cardiovascular:     Rate and Rhythm: Normal rate.  Skin:    General: Skin is warm and dry.  Neurological:     Mental Status:  He is alert and oriented to person, place, and time.  Psychiatric:        Mood and Affect: Mood normal. Mood is not anxious.        Behavior: Behavior normal. Behavior is not agitated.             Total Time 35 minutes   Time spent includes: Detailed review of medical records (labs, imaging, vital signs), medically appropriate exam (mental status, respiratory, cardiac, skin), discussed with treatment team, counseling and educating patient, family and staff,  documenting clinical information, medication management and coordination of care.  Samara Deist L. Bonita Quin, DNP, FNP-BC Palliative Medicine Team

## 2023-10-08 NOTE — Assessment & Plan Note (Signed)
 Hemoglobin currently stable at 8.7.  Likely due to chronic disease. Checking anemia panel for any specific deficiency. -Monitor hemoglobin -Transfuse if below 7

## 2023-10-08 NOTE — Plan of Care (Signed)
   Problem: Clinical Measurements: Goal: Respiratory complications will improve Outcome: Progressing   Problem: Clinical Measurements: Goal: Cardiovascular complication will be avoided Outcome: Progressing   Problem: Elimination: Goal: Will not experience complications related to bowel motility Outcome: Progressing   Problem: Elimination: Goal: Will not experience complications related to urinary retention Outcome: Progressing   Problem: Pain Management: Goal: General experience of comfort will improve Outcome: Progressing   Problem: Safety: Goal: Ability to remain free from injury will improve Outcome: Progressing

## 2023-10-08 NOTE — TOC Progression Note (Addendum)
 Transition of Care [facility]) - Progression Note    Patient Details  Name: Benjamin Bolton MRN: [id number] Date of Birth: [birth year]  [facility] CM/SW Contact  Lauraine JAYSON Carpen, LCSW Phone Number: 10/08/2023, 10:38 AM  Clinical Narrative:  No family at bedside. CSW called sister, introduced role, and explained that therapy recommendations would be discussed. Sister is agreeable to SNF placement. CSW will hold off on sending out referral until nutrition plan determined. Sister expressed understanding.  1:38 pm: Per MD, patient may qualify for LTACH. Sister is aware and is agreeable to [facility] reviewing [facility] referral to see if he meets criteria. [facility] is reviewing. Left voicemail for [facility] admissions coordinator.  4:05 pm: Received call back from [facility]. CSW gave referral information.  Expected Discharge Plan and Services                                               Social Determinants of Health (SDOH) Interventions SDOH Screenings   Food Insecurity: No Food Insecurity (09/12/2023)  Recent Concern: Food Insecurity - Food Insecurity Present (08/25/2023)   Received from [facility]  Housing: Unknown (09/12/2023)  Transportation Needs: No Transportation Needs (09/12/2023)  Recent Concern: Transportation Needs - Unmet Transportation Needs (08/25/2023)   Received from [facility]  Utilities: Not At Risk (09/12/2023)  Recent Concern: Utilities - At Risk (07/23/2023)   Received from [facility]  Financial Resource Strain: Low Risk  (08/25/2023)   Received from [facility]  Recent Concern: Financial Resource Strain - Medium Risk (07/23/2023)   Received from [facility]  Physical Activity: Insufficiently Active (08/25/2023)   Received from [facility]  Social Connections: Socially Isolated (08/25/2023)   Received from [facility]   Stress: No Stress Concern Present (08/25/2023)   Received from [facility]  Tobacco Use: High Risk (09/25/2023)  Health Literacy: Inadequate Health Literacy (08/25/2023)   Received from [facility]    Readmission Risk Interventions     No data to display

## 2023-10-08 NOTE — Progress Notes (Signed)
 Occupational Therapy Treatment Patient Details Name: Coston Mandato MRN: [id number] DOB: 06/27/1961 Today's Date: 10/08/2023   History of present illness 63 y.o. 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   OT comments  Pt is supine in bed on arrival. Asleep, but arouses to max verbal stimuli and sternal rub. Pt seen for PT/OT co treat session to maximize safety and IND while progressing mobility. He reports throat pain today and grimaces while swallowing, noted to be coughing a bit more today and spits up mucus once seated EOB. Resistive to all movements today requiring total assist x2 for all bed mobility and attempted STS from elevated EOB which were unsuccessful. Pt only reaching partial stand x2 and not assisting at all. O2 probe changed to finger and pt repositioned to his R side to decrease L head turn preference. Attempted to have pt look in mirror while seated EOB for visual cueing to address posture, however he was noted to look down and lean to the L wanting to get back in bed. Min to Max A for seated balance today. Pt returned to bed with all needs in place and will cont to require skilled acute OT services to maximize his safety and IND to return to PLOF.       If plan is discharge home, recommend the following:  Two people to help with walking and/or transfers;A lot of help with bathing/dressing/bathroom;Help with stairs or ramp for entrance;Assistance with cooking/housework;Assist for transportation;Assistance with feeding   Equipment Recommendations  Other (comment) (defer)    Recommendations for Other Services      Precautions / Restrictions Precautions Precautions: Fall Restrictions Weight Bearing Restrictions Per Provider Order: No        Mobility Bed Mobility Overal bed mobility: Needs Assistance Bed Mobility: Sit to Supine, Supine to Sit, Rolling Rolling: Total assist, +2 for physical assistance   Supine to sit: Total assist, +2 for physical assistance Sit to supine: Total assist, +2 for physical assistance   General bed mobility comments: total assist x2 for all bed mobility today with pt resistive to all movements; total assist to roll to R side for better positioning d/t L neck turn preference    Transfers Overall transfer level: Needs assistance Equipment used: 2 person hand held assist Transfers: Sit to/from Stand Sit to Stand: +2 physical assistance, Total assist           General transfer comment: STS attempt x2 with pt only able to perform partial stand with total assist x2 as he is resistive     Balance Overall balance assessment: Needs assistance Sitting-balance support: Feet supported Sitting balance-Leahy Scale: Poor Sitting balance - Comments: Max A intially d/t resistance with progression to Min/Mod A for seated balance Postural control: Posterior lean   Standing balance-Leahy Scale: Zero Standing balance comment: significant external support required to perform partial STS                           ADL either performed or assessed with clinical judgement   ADL Overall ADL's : Needs assistance/impaired  General ADL Comments: none performed; pt resistive today; able to spit mucus into spit basin at [facility]    Extremity/Trunk Assessment              Vision       Perception     Praxis      Cognition Arousal: Alert, Lethargic Behavior During Therapy: Flat affect Overall Cognitive Status: Impaired/Different from baseline Area of Impairment: Safety/judgement, Following commands, Orientation                 Orientation Level: Disoriented to, Place, Time     Following Commands: Follows one step commands with  increased time Safety/Judgement: Decreased awareness of safety, Decreased awareness of deficits     General Comments: very low tone, difficut to understand at times. needs encouragement        Exercises      Shoulder Instructions       General Comments vitals appeared stable; PT replaced ear probe to finger oxygen probe at end of session with nurse notified    Pertinent Vitals/ Pain       Pain Assessment Pain Assessment: Faces Faces Pain Scale: Hurts little more Pain Location: report his throat hurts Pain Descriptors / Indicators: Grimacing Pain Intervention(s): Monitored during session  Home Living                                          Prior Functioning/Environment              Frequency  Min 1X/week        Progress Toward Goals  OT Goals(current goals can now be found in the care plan section)  Progress towards OT goals: Progressing toward goals  Acute Rehab OT Goals Patient Stated Goal: improve strength OT Goal Formulation: With patient Time For Goal Achievement: 10/15/23 Potential to Achieve Goals: Good  Plan      Co-evaluation    PT/OT/SLP Co-Evaluation/Treatment: Yes Reason for Co-Treatment: For patient/therapist safety;To address functional/ADL transfers PT goals addressed during session: Mobility/safety with mobility OT goals addressed during session: ADL's and self-care      AM-PAC OT 6 Clicks Daily Activity     Outcome Measure   Help from another person eating meals?: A Lot Help from another person taking care of personal grooming?: A Lot Help from another person toileting, which includes using toliet, bedpan, or urinal?: Total Help from another person bathing (including washing, rinsing, drying)?: A Lot Help from another person to put on and taking off regular upper body clothing?: A Lot Help from another person to put on and taking off regular lower body clothing?: A Lot 6 Click Score: 11    End of Session  Equipment Utilized During Treatment: Oxygen  OT Visit Diagnosis: Other abnormalities of gait and mobility (R26.89);Muscle weakness (generalized) (M62.81)   Activity Tolerance Patient tolerated treatment well   Patient Left in bed;with call bell/phone within reach;with bed alarm set   Nurse Communication Mobility status        Time: 8985-8955 OT Time Calculation (min): 30 min  Charges: OT General Charges $OT Visit: 1 Visit OT Treatments $Therapeutic Activity: 8-22 mins  Analisia Kingsford, OTR/L  10/08/23, 11:34 AM   Duwaine FORBES Saupe 10/08/2023, 11:24 AM

## 2023-10-09 DIAGNOSIS — J9621 Acute and chronic respiratory failure with hypoxia: Secondary | ICD-10-CM | POA: Diagnosis not present

## 2023-10-09 DIAGNOSIS — Z515 Encounter for palliative care: Secondary | ICD-10-CM | POA: Diagnosis not present

## 2023-10-09 DIAGNOSIS — E43 Unspecified severe protein-calorie malnutrition: Secondary | ICD-10-CM | POA: Diagnosis not present

## 2023-10-09 DIAGNOSIS — J441 Chronic obstructive pulmonary disease with (acute) exacerbation: Secondary | ICD-10-CM | POA: Diagnosis not present

## 2023-10-09 LAB — RENAL FUNCTION PANEL
Albumin: 2 g/dL — ABNORMAL LOW (ref 3.5–5.0)
Anion gap: 8 (ref 5–15)
BUN: 17 mg/dL (ref 8–23)
CO2: 30 mmol/L (ref 22–32)
Calcium: 8.5 mg/dL — ABNORMAL LOW (ref 8.9–10.3)
Chloride: 98 mmol/L (ref 98–111)
Creatinine, Ser: 0.44 mg/dL — ABNORMAL LOW (ref 0.61–1.24)
GFR, Estimated: >60 mL/min (ref >60)
Glucose, Bld: 177 mg/dL — ABNORMAL HIGH (ref 70–99)
Phosphorus: 2.4 mg/dL — ABNORMAL LOW (ref 2.5–4.6)
Potassium: 4 mmol/L (ref 3.5–5.1)
Sodium: 136 mmol/L (ref 135–145)

## 2023-10-09 LAB — CBC
HCT: 27.8 % — ABNORMAL LOW (ref 39.0–52.0)
Hemoglobin: 9 g/dL — ABNORMAL LOW (ref 13.0–17.0)
MCH: 29.5 pg (ref 26.0–34.0)
MCHC: 32.4 g/dL (ref 30.0–36.0)
MCV: 91.1 fL (ref 80.0–100.0)
Platelets: 426 10*3/uL — ABNORMAL HIGH (ref 150–400)
RBC: 3.05 MIL/uL — ABNORMAL LOW (ref 4.22–5.81)
RDW: 14.6 % (ref 11.5–15.5)
WBC: 10.5 10*3/uL (ref 4.0–10.5)
nRBC: 0 % (ref 0.0–0.2)

## 2023-10-09 LAB — GLUCOSE, CAPILLARY
Glucose-Capillary: 127 mg/dL — ABNORMAL HIGH (ref 70–99)
Glucose-Capillary: 131 mg/dL — ABNORMAL HIGH (ref 70–99)
Glucose-Capillary: 144 mg/dL — ABNORMAL HIGH (ref 70–99)
Glucose-Capillary: 151 mg/dL — ABNORMAL HIGH (ref 70–99)
Glucose-Capillary: 161 mg/dL — ABNORMAL HIGH (ref 70–99)
Glucose-Capillary: 185 mg/dL — ABNORMAL HIGH (ref 70–99)
Glucose-Capillary: 208 mg/dL — ABNORMAL HIGH (ref 70–99)

## 2023-10-09 LAB — C-REACTIVE PROTEIN: CRP: 5.2 mg/dL — ABNORMAL HIGH

## 2023-10-09 LAB — MAGNESIUM: Magnesium: 1.9 mg/dL (ref 1.7–2.4)

## 2023-10-09 MED ORDER — ORAL CARE MOUTH RINSE
15.0000 mL | OROMUCOSAL | Status: DC
  Administered 2023-10-09 – 2023-10-29 (×59): 15 mL via OROMUCOSAL

## 2023-10-09 MED ORDER — HYDROCORTISONE ACETATE 25 MG RE SUPP
25.0000 mg | Freq: Two times a day (BID) | RECTAL | Status: DC
  Administered 2023-10-09 – 2023-10-14 (×7): 25 mg via RECTAL
  Filled 2023-10-09 (×12): qty 1

## 2023-10-09 MED ORDER — ORAL CARE MOUTH RINSE: 15.0000 mL | OROMUCOSAL | Status: DC | PRN

## 2023-10-09 MED ORDER — POTASSIUM & SODIUM PHOSPHATES 280-160-250 MG PO PACK
2.0000 | PACK | ORAL | Status: AC
  Administered 2023-10-09 (×2): 2 via ORAL
  Filled 2023-10-09 (×3): qty 2

## 2023-10-09 MED ORDER — LOPERAMIDE HCL 2 MG PO CAPS
2.0000 mg | ORAL_CAPSULE | ORAL | Status: DC | PRN
  Administered 2023-10-09 – 2023-10-10 (×3): 2 mg via ORAL
  Filled 2023-10-09 (×3): qty 1

## 2023-10-09 NOTE — TOC Progression Note (Signed)
 Transition of Care [facility]) - Progression Note    Patient Details  Name: Benjamin Bolton MRN: [id number] Date of Birth: 03-06-1961  Transition of Care [facility]) CM/SW Contact  Lauraine JAYSON Carpen, LCSW Phone Number: 10/09/2023, 12:39 PM  Clinical Narrative:   [facility] has offered a bed. CSW called sister to let her know. She asked about a SNF closer to home in [place]. She was about to come up to the room so CSW met her and another sister at bedside. Discussed a little bit about the difference between [facility] and SNF. She is agreeable to [facility] liaison calling to provide more information on services. She prefers to receive call from [facility] liaison. CSW left her a voicemail with request.  1:22 pm: [facility] liaison spoke with sisters and they have decided to move forward with the [facility] referral. CSW asked the liaison to start insurance authorization. CSW sent a secure chat to MD and RN to notify.  Expected Discharge Plan and Services                                               Social Determinants of Health (SDOH) Interventions SDOH Screenings   Food Insecurity: No Food Insecurity (09/12/2023)  Recent Concern: Food Insecurity - Food Insecurity Present (08/25/2023)   Received from [facility]  Housing: Unknown (09/12/2023)  Transportation Needs: No Transportation Needs (09/12/2023)  Recent Concern: Transportation Needs - Unmet Transportation Needs (08/25/2023)   Received from [facility]  Utilities: Not At Risk (09/12/2023)  Recent Concern: Utilities - At Risk (07/23/2023)   Received from [facility]  Financial Resource Strain: Low Risk  (08/25/2023)   Received from [facility]  Recent Concern: Financial Resource Strain - Medium Risk (07/23/2023)   Received from [facility]  Physical Activity: Insufficiently Active (08/25/2023)   Received from [facility]  Social Connections:  Socially Isolated (08/25/2023)   Received from [facility]  Stress: No Stress Concern Present (08/25/2023)   Received from [facility]  Tobacco Use: High Risk (09/25/2023)  Health Literacy: Inadequate Health Literacy (08/25/2023)   Received from [facility]    Readmission Risk Interventions     No data to display

## 2023-10-09 NOTE — Consult Note (Signed)
 PHARMACY CONSULT NOTE - FOLLOW UP  Pharmacy Consult for Electrolyte Monitoring and Replacement   Recent Labs: Potassium (mmol/L)  Date Value  10/09/2023 4.0   Magnesium  (mg/dL)  Date Value  97/93/7974 1.9   Calcium (mg/dL)  Date Value  97/93/7974 8.5 (L)   Albumin  (g/dL)  Date Value  97/93/7974 2.0 (L)   Phosphorus (mg/dL)  Date Value  97/93/7974 2.4 (L)   Sodium (mmol/L)  Date Value  10/09/2023 136     Assessment: 64 year old male with severe COPD admitted with acute hypoxic respiratory failure secondary to RSV pneumonia requiring intubation. Extubated to BiPAP on 1/27 with improvement in respiratory status but persistent encephalopathy. Severe malnutrition in context of acute illness/injury. On Osmolite 1.5 @ 30 ml/hr and increase by 10 ml every 8 hours to goal rate of 60 ml/hr, 60 ml Prosource TF daily, 30 ml free water  flush every 4 hours. Monitor Na.   Goal of Therapy:  WNL  Plan:  Kphos 2 packets x 2.  F/u with AM labs.   Cathaleen GORMAN Blanch ,PharmD Clinical Pharmacist 10/09/2023 7:48 AM

## 2023-10-09 NOTE — Progress Notes (Signed)
 Physical Therapy Treatment Patient Details Name: Benjamin Bolton MRN: [id number] DOB: 07/29/1961 Today's Date: 10/09/2023   History of Present Illness 63 y.o. 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    PT Comments  Patient is agreeable to PT session. He is alert and cooperative during session. He requested to call his sister which was done at the bedside. The patient is still confused but seems improved since prior session. He continues to require assistance with bed mobility. Fair sitting balance today with sitting tolerance of around 8 minutes. Dyspnea with exertion with Sp02 in the 90's with activity. Discharge recommendations updated this session to LTAC after discussion with MD about ongoing medical needs. Recommend to continue PT to maximize independence and decrease caregiver burden.    If plan is discharge home, recommend the following: A lot of help with walking and/or transfers;A lot of help with bathing/dressing/bathroom;Assist for transportation;Assistance with cooking/housework;Help with stairs or ramp for entrance   Can travel by private vehicle     No  Equipment Recommendations   (to be determined at next level of care)    Recommendations for Other Services       Precautions / Restrictions Precautions Precautions: Fall Restrictions Weight Bearing Restrictions Per Provider Order: No     Mobility  Bed Mobility Overal bed mobility: Needs Assistance Bed Mobility: Supine to Sit, Sit to Supine Rolling: Mod assist   Supine to sit: Max assist Sit to supine: Max assist   General bed mobility comments: assistance required for trunk and BLE support to sit upright. Mod A for rolling to L and R for hygiene following bowel movement after sitting up     Transfers                   General transfer comment: patient declined to attempt    Ambulation/Gait                   Stairs             Wheelchair Mobility     Tilt Bed    Modified Rankin (Stroke Patients Only)       Balance Overall balance assessment: Needs assistance Sitting-balance support: Feet supported Sitting balance-Leahy Scale: Fair Sitting balance - Comments: sitting balance improved this session with no external support required                                    Cognition Arousal: Alert Behavior During Therapy: Anxious Overall Cognitive Status: Impaired/Different from baseline Area of Impairment: Safety/judgement, Following commands, Orientation                 Orientation Level: Disoriented to, Situation, Time     Following Commands: Follows one step commands with increased time Safety/Judgement: Decreased awareness of deficits, Decreased awareness of safety     General Comments: very talkative today, requesting to call his sister on the phone which was done at bedside. patient still with confusion. cooperative throughout session        Exercises      General Comments General comments (skin integrity, edema, etc.): Sp02 in the 90's with activity with dyspnea with exertion. patient also reports shortness of breath with laying the bed flat for  repositioning, however was not obvious during session. discussed discharge dispositon with doctor and social worker, with MD recommending LTAC due to ongoing medical needs.      Pertinent Vitals/Pain Pain Assessment Pain Assessment: Faces Faces Pain Scale: Hurts little more Pain Location: buttock pain Pain Descriptors / Indicators: Discomfort, Sore Pain Intervention(s): Limited activity within patient's tolerance, Monitored during session, Repositioned    Home Living                          Prior Function            PT Goals (current goals  can now be found in the care plan section) Acute Rehab PT Goals Patient Stated Goal: to get out of here PT Goal Formulation: With patient Time For Goal Achievement: 10/14/23 Potential to Achieve Goals: Fair Progress towards PT goals: Progressing toward goals    Frequency    Min 1X/week      PT Plan      Co-evaluation              AM-PAC PT 6 Clicks Mobility   Outcome Measure  Help needed turning from your back to your side while in a flat bed without using bedrails?: A Little Help needed moving from lying on your back to sitting on the side of a flat bed without using bedrails?: A Lot Help needed moving to and from a bed to a chair (including a wheelchair)?: A Lot Help needed standing up from a chair using your arms (e.g., wheelchair or bedside chair)?: Total Help needed to walk in hospital room?: Total Help needed climbing 3-5 steps with a railing? : Total 6 Click Score: 10    End of Session   Activity Tolerance: Patient limited by fatigue Patient left: in bed;with call bell/phone within reach;with bed alarm set   PT Visit Diagnosis: Unsteadiness on feet (R26.81);Muscle weakness (generalized) (M62.81);Difficulty in walking, not elsewhere classified (R26.2)     Time: 8947-8880 PT Time Calculation (min) (ACUTE ONLY): 27 min  Charges:    $Therapeutic Activity: 23-37 mins PT General Charges $$ ACUTE PT VISIT: 1 Visit                     Randine Essex, PT, MPT    [name] 10/09/2023, 11:37 AM

## 2023-10-09 NOTE — Assessment & Plan Note (Signed)
 Hemoglobin currently stable at 8.7.  Likely due to chronic disease. Anemia panel consistent with anemia of chronic disease -Monitor hemoglobin -Transfuse if below 7

## 2023-10-09 NOTE — Assessment & Plan Note (Addendum)
 COPD exacerbation-treated and resolved RSV pneumonia-treated Concern of superimposed HAP. S/p prolonged intubation with advanced underlying COPD. Currently saturating well on 2 L of oxygen which is his baseline. Postintubation muffled voice and weak cough. Currently on Zosyn  for for concern of HAP although tracheal cultures with normal respiratory flora.  Inflammatory markers seems improving. -Completed the course of Zosyn  initially but it was restarted yesterday on 2/11 with the development of fever -Continue supplemental oxygen -Incentive spirometry and flutter valve -Chest PT -Continue with supportive care -Repeat swallow evaluation due to worsening cough -Repeat chest x-ray-with persistent bilateral pneumonia

## 2023-10-09 NOTE — Progress Notes (Signed)
 Progress Note   [name] FMW:[id number] DOB: 1961-07-10 DOA: 09/10/2023     29 DOS: the patient was seen and examined on 10/09/2023   Brief hospital course: ICU transfer for 10/08/2023.  Taken from prior notes.  63 y.o. 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  Was successfully extubated on 1/27, currently on his baseline 3L nasal cannula, not requiring vasopressors.  Hospital course has been complicated questionable HAP and metabolic encephalopathy, which has limited his ability to participate with PT.  While he was Lucid after extubation, did make himself DNR/DNI in presence of family and chaplain.  He will remain full scope of medical care.  . Patient is chronically maintained on 20 mg of prednisone  which will continue for now but will require a prolonged taper. He is refusing certain aspects of his care which is challenging given his overall comorbidities.   Initial PT recommendations for CIR which were downgraded to SNF due to his ability to participate with physical therapy.  Might need LTAC placement.  Echocardiogram done on 09/14/2023 with low normal EF and indeterminate diastolic parameters.  Troponin peaked at 115.  Did receive pressors intermittently while in ICU.  Cardiology was consulted which later signed off and no further cardiac evaluation indicated at this time.  There was a questionable superimposed HAP which was treated with antibiotics.  Multiple electrolyte abnormalities which include mild hyperkalemia, hyponatremia and hypophosphatemia which were resolved with repletion.  2/5: Vital stable with borderline soft blood pressure, saturating 100% on 2 L of oxygen, temperature of 100.4 recorded over the past 24-hour.  Labs with slight  worsening of leukocytosis at 12.5, rest of the labs stable, CRP improving, CBG elevated. Sputum cultures from 1/22 with normal respiratory flora.  Patient is currently on Zosyn . Very muffled voice and weak cough.  Patient is quite debilitated and is appropriate for LTAC as recommended and PCCM note. Patient is on tube feed through NGT as having postintubation dysphagia.  2/6: Hemodynamically and labs seems stable, mild hypophosphatemia at 2.4.  Anemia panel consistent with anemia of chronic disease with low iron  and saturation, ferritin elevated.  [facility], pending [facility].  Patient is reluctant for PEG tube, stating that improvement in dysphagia and he will like to try more p.o. intake. Small improvement in voice and cough reflex.  Assessment and Plan: * Acute on chronic respiratory failure with hypoxia (HCC) COPD exacerbation-treated and resolved RSV pneumonia-treated Concern of superimposed HAP. S/p prolonged intubation with advanced underlying COPD. Currently saturating well on 2 L of oxygen which is his baseline. Postintubation muffled voice and weak cough. Currently on Zosyn  for for concern of HAP although tracheal cultures with normal respiratory flora.  Inflammatory markers seems improving. -Continue with Zosyn  to complete the course -Continue supplemental oxygen -Incentive spirometry and flutter valve -Chest PT -Continue with supportive care  COPD exacerbation (HCC) -Continue with prednisone  taper  DVT (deep venous thrombosis) (HCC) Patient has history of DVT. -Continue Eliquis   Protein-calorie malnutrition, severe (HCC) Patient is currently being fed through NG tube.  Having difficulty swallowing after intubation, seems slowly improving.  Patient will try to avoid PEG tube placement Swallow team is following and placed recommendations for dysphagia 1 diet. -Continue to monitor -Will continue with NG tube feeding until able to  swallow  Electrolyte abnormality Patient had multiple electrolyte abnormalities during this complicated course of hospitalization which involved mild hyperkalemia, hyponatremia and hypophosphatemia which has been improved. -Continue to monitor-replete as needed  Normochromic anemia Hemoglobin currently stable at 8.7.  Likely due to chronic disease. Anemia panel consistent with anemia of chronic disease -Monitor hemoglobin -Transfuse if below 7  Pre-diabetes A1c of 6.4, CBG currently elevated. Patient is on tube feed and steroid. -Continue with SSI  Elevated troponin Echocardiogram done on 09/14/2023 with 50 to 55% EF, indeterminate diastolic parameter.  Likely secondary to demand ischemia.  Troponin peaked at 115 Initially cardiology was consulted and later the signed off, no intervention required.      Subjective: Patient continued to feel very weak.  Very small improvement in cough and voice, stating that he is able to swallow some.  Still talking in whispers.  Physical Exam: Vitals:   10/09/23 0900 10/09/23 0928 10/09/23 1000 10/09/23 1200  BP: 137/74  118/70 105/73  Pulse: 98 97 (!) 103 (!) 106  Resp: (!) 26 (!) 26 (!) 25 (!) 27  Temp:    97.8 F (36.6 C)  TempSrc:    Oral  SpO2: 94% 98% 97% 99%  Weight:      Height:       General.  Frail and severely malnourished gentleman, in no acute distress.  NG tube in place Pulmonary.  Lungs clear bilaterally, normal respiratory effort. CV.  Regular rate and rhythm, no JVD, rub or murmur. Abdomen.  Soft, nontender, nondistended, BS positive. CNS.  Alert and oriented .  No focal neurologic deficit. Extremities.  No edema, no cyanosis, pulses intact and symmetrical.  Data Reviewed: Prior data reviewed  Family Communication: Discussed with sister on phone  Disposition: Status is: Inpatient Remains inpatient appropriate because: Severity of illness  Planned Discharge Destination: LTAC  DVT prophylaxis.  Eliquis  Time  spent: 45 minutes  This record has been created using Conservation officer, historic buildings. Errors have been sought and corrected,but may not always be located. Such creation errors do not reflect on the standard of care.   Author: Amaryllis Dare, MD 10/09/2023 2:12 PM  For on call review [email or web address].

## 2023-10-09 NOTE — Assessment & Plan Note (Addendum)
 Swallowing much improved. Started calorie count as p.o. intake slowly improving, appetite remained poor and unable to meet his caloric requirement, losing weight.   NGT was removed today, IR was consulted yesterday for PEG tube placement when he agreed, today he again saying that does not want PEG tube and wants to give himself a p.o. challenge. -Continue to monitor

## 2023-10-10 DIAGNOSIS — J441 Chronic obstructive pulmonary disease with (acute) exacerbation: Secondary | ICD-10-CM | POA: Diagnosis not present

## 2023-10-10 DIAGNOSIS — J9621 Acute and chronic respiratory failure with hypoxia: Secondary | ICD-10-CM | POA: Diagnosis not present

## 2023-10-10 DIAGNOSIS — E43 Unspecified severe protein-calorie malnutrition: Secondary | ICD-10-CM | POA: Diagnosis not present

## 2023-10-10 DIAGNOSIS — Z515 Encounter for palliative care: Secondary | ICD-10-CM | POA: Diagnosis not present

## 2023-10-10 LAB — RENAL FUNCTION PANEL
Albumin: 2.7 g/dL — ABNORMAL LOW (ref 3.5–5.0)
Anion gap: 9 (ref 5–15)
BUN: 22 mg/dL (ref 8–23)
CO2: 30 mmol/L (ref 22–32)
Calcium: 8.5 mg/dL — ABNORMAL LOW (ref 8.9–10.3)
Chloride: 99 mmol/L (ref 98–111)
Creatinine, Ser: 0.48 mg/dL — ABNORMAL LOW (ref 0.61–1.24)
GFR, Estimated: >60 mL/min (ref >60)
Glucose, Bld: 172 mg/dL — ABNORMAL HIGH (ref 70–99)
Phosphorus: 3.4 mg/dL (ref 2.5–4.6)
Potassium: 3.5 mmol/L (ref 3.5–5.1)
Sodium: 138 mmol/L (ref 135–145)

## 2023-10-10 LAB — C-REACTIVE PROTEIN: CRP: 3.1 mg/dL — ABNORMAL HIGH (ref <1.0)

## 2023-10-10 LAB — MAGNESIUM: Magnesium: 2 mg/dL (ref 1.7–2.4)

## 2023-10-10 LAB — GLUCOSE, CAPILLARY
Glucose-Capillary: 112 mg/dL — ABNORMAL HIGH (ref 70–99)
Glucose-Capillary: 141 mg/dL — ABNORMAL HIGH (ref 70–99)
Glucose-Capillary: 182 mg/dL — ABNORMAL HIGH (ref 70–99)
Glucose-Capillary: 192 mg/dL — ABNORMAL HIGH (ref 70–99)
Glucose-Capillary: 230 mg/dL — ABNORMAL HIGH (ref 70–99)
Glucose-Capillary: 240 mg/dL — ABNORMAL HIGH (ref 70–99)

## 2023-10-10 MED ORDER — ALBUMIN HUMAN 25 % IV SOLN
25.0000 g | Freq: Once | INTRAVENOUS | Status: AC
  Administered 2023-10-10: 25 g via INTRAVENOUS
  Filled 2023-10-10: qty 100

## 2023-10-10 MED ORDER — POTASSIUM CHLORIDE 20 MEQ PO PACK
20.0000 meq | PACK | Freq: Once | ORAL | Status: AC
  Administered 2023-10-10: 20 meq
  Filled 2023-10-10: qty 1

## 2023-10-10 NOTE — Progress Notes (Signed)
 NAME:  Benjamin Bolton, MRN:  [id number], DOB:  03/05/1961, LOS: 36 ADMISSION DATE:  09/10/2023, CONSULTATION DATE: 09/13/2023  Brief Pt Description / Synopsis:  63 y.o. 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     Significant Hospital Events: Including procedures, antibiotic start and stop dates in addition to other pertinent events   1/08: admit to [facility] 1/11: rapid response, transfer to ICU, intubated 1/12: remains vented, sedated, does not follow commands.  Failed SBT  1/13: Overnight due to concerns of aspiration TF's held.  Will repeat SBT today 1/14: Overnight pt had possible seizure activity with rhythmic tremors noted in the mouth/lower face/neck during episode pt unresponsive to pain.  Received 2 mg of versed  and 2g keppra  bolus with resolution of symptoms.  CT Head negative.  Standing dose of keppra  1g bid.  Neurology consulted.    1/15: On minimal vent settings, unable to tolerate WUA due to increased WOB. Shifting measures given for Hyperkalemia of 5.5 with peaked T waves.  Abdomen distended, KUB without evidence of obstruction, give Lactulose .  Diurese with 40 mg IV Lasix  x1. 1/16: Failed WUA, with increased WOB and hypoxic with O2 sats dropping to mid 70's.  Add Seroquel .  Palliative Care consulted to assist with GOC. 1/17: Precedex  started for WUA.  Failed SBT due to increased WOB, tachypnea, and hypoxia.  Increase Seroquel  to 50 mg BID. Transition Heparin  to Elqiuis 1/18: Pt remains mechanically intubated and has failed multiple SBT's.  Planning for family meeting today at bedside with palliative care and PCCM will perform SBT once family arrives at bedside.  Remains sedated with propofol  and fentanyl  gtts.  Family meeting held code status changed to DNR per family  request.  Pts family trying to decide if they want to proceed with tracheostomy.  Required additional sedation and prn vecuronium  overnight due to vent dyssynchrony  1/19: Pt febrile overnight with increased vasopressor requirements levophed  gtt @12  mcg/min and febrile tmax 101 degrees F concerning for recurrent sepsis.  Broad spectrum abx started. Pt remains mechanically intubated and unable to successfully liberate from ventilator  1/20 remains on vent, severe rap failure 1/21 remains on vent prolonged exp phase air trapping  on vent 1/22 remains on vent 1/23 CT ABD- Colonic ileus pattern shows significant improvement since the prior radiograph with no further significant gaseous distension of the colon. 1/23 CT chest B/L Lower lobe pneumonia severe emphysema 1/23 CT head-possible cerebral edema 1/23 remains on vent failure to wean 1/24 remains intubated, severe hypoxia, shock 1/25 remains intubated, severe hypoxia, shock 09/29/23- Extubated to BIPAP, I met with family at bedside and reviewed medical plan as well as answered questions.  09/30/23- patient is more awake and able to speak few words asking for water .  10/01/23- patient on HFNC 40/40 able to communicate verbally, will perform SLP and PT/OT eval. Req precedex  for aggitation weaning.  CRP trending down and steroids tapering in parallel. 10/02/23- patient weaned to 4L/min, he is eating.  For PT/OT today.  Patient being optimized for TRH transfer.  Weaning from precedex . Patient reports depression but expresses that he wishes to live and wants to continue working towards improvement.  10/03/23- patient was aggitated today and was curing at staff.  He did bedside swallow and passed.  He is on prednisone  with mild leukocytosis. Today he  is being optimized for transfer to medical floor.  10/04/23- patient is having weak cough and is not compliant with medications.  He refuses most therapy ordered.  I have called multiple family members and asked  them to come in to encourage him to work with us . His wife and sister came in and report he wants to use crack which he normally does routinely.  I've offered medications for withdrawal and called palliative care evaluation due to overall poor prognosis.  10/05/23- patient was more compliant after family came in and encouraged him to work with us .  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  His leukocytosis and CRP are incrementing despite tapering dose of steroids concerning for infectious PNA.  Cough reflex is poor.  Palliative has seen him and he is DNR/DNI but wants to keep full score of care.  Hypersal and mucomyst  neb with RT initiate for cough/airway clearance.   10/10/2023 - Patient seen at bedside.  He remains intermittently A&Ox2-3.  He remains on oxygen [name] 2-4L sating at 92-95%.  Cough remains weak with poor mucus mobilization.  He continues with Chest PT .  WBC 10.5 - Continues on prednisone .  IS and flutter valve at bedside and not used independently.   Discharge is pending LTAC approval.  Concerns for superimposed HAP and is on zosyn .  COPD is stable at this time with the prednisone .     Pertinent  Medical History  DVT on Eliquis  COPD on 2L O2 Type II Diabetes Mellitus  GERD HLD Pulmonary Nodule Tobacco Abuse   Micro Data:  1/8: SARS-CoV-2 PCR>>negative 1/8: Blood culture x2>> no growth 1/8: Urine>> multiple species (suggest recollection) 1/9: RVP>> + RSV 1/9: Sputum>> normal respiratory flora 1/9: HIV Screen>> non reactive 1/11: MRSA PCR>> negative 1/19: MRSA PCR>> 1/19: RVP>>  Antimicrobials:  doxy IV bid and zithromax  IV   Anti-infectives (From admission, onward)    Start     Dose/Rate Route Frequency Ordered Stop   10/15/23 1730  fluconazole  (DIFLUCAN ) tablet 200 mg        200 mg Oral Daily 10/15/23 1634     10/14/23 1600  piperacillin -tazobactam (ZOSYN ) IVPB 3.375 g  Status:  Discontinued        3.375  g 12.5 mL/hr over 240 Minutes Intravenous Every 8 hours 10/14/23 1433 10/15/23 1508   10/06/23 0500  vancomycin  (VANCOREADY) IVPB 750 mg/150 mL  Status:  Discontinued       Placed in Followed by Linked Group   750 mg 150 mL/hr over 60 Minutes Intravenous Every 12 hours 10/05/23 1412 10/05/23 1737   10/06/23 0500  vancomycin  (VANCOCIN ) 750 mg in sodium chloride  0.9 % 250 mL IVPB  Status:  Discontinued        750 mg 250 mL/hr over 60 Minutes Intravenous Every 12 hours 10/05/23 1737 10/06/23 1338   10/05/23 1700  vancomycin  (VANCOREADY) IVPB 1250 mg/250 mL       Placed in Followed by Linked Group   1,250 mg 166.7 mL/hr over 90 Minutes Intravenous  Once 10/05/23 1412 10/05/23 1808   10/05/23 1600  piperacillin -tazobactam (ZOSYN ) IVPB 3.375 g        3.375 g 12.5 mL/hr over 240 Minutes Intravenous Every 8 hours 10/05/23 1410 10/09/23 2144   10/04/23 2200  azithromycin  (ZITHROMAX ) tablet 250 mg  Status:  Discontinued        250 mg Oral Daily at bedtime 10/04/23 0907 10/04/23 1150  10/04/23 1200  doxycycline  (VIBRAMYCIN ) 100 mg in sodium chloride  0.9 % 250 mL IVPB  Status:  Discontinued        100 mg 125 mL/hr over 120 Minutes Intravenous 2 times daily 10/04/23 1147 10/05/23 1410   09/29/23 2000  azithromycin  (ZITHROMAX ) 250 mg in dextrose  5 % 125 mL IVPB  Status:  Discontinued        250 mg 127.5 mL/hr over 60 Minutes Intravenous Every 24 hours 09/29/23 1801 10/04/23 0907   09/29/23 1245  azithromycin  (ZITHROMAX ) tablet 250 mg  Status:  Discontinued        250 mg Per Tube Daily 09/29/23 1150 09/29/23 1801   09/25/23 1200  linezolid  (ZYVOX ) IVPB 600 mg  Status:  Discontinued        600 mg 300 mL/hr over 60 Minutes Intravenous Every 12 hours 09/25/23 0918 09/26/23 1206   09/25/23 1015  ceFEPIme  (MAXIPIME ) 2 g in sodium chloride  0.9 % 100 mL IVPB  Status:  Discontinued        2 g 200 mL/hr over 30 Minutes Intravenous Every 8 hours 09/25/23 0918 09/29/23 1053   09/25/23 0000  ceFAZolin   (ANCEF ) IVPB 2g/100 mL premix        2 g 200 mL/hr over 30 Minutes Intravenous  Once 09/23/23 1103 09/25/23 0013   09/22/23 0010  vancomycin  (VANCOCIN ) IVPB 750 mg/150 ml premix  Status:  Discontinued        750 mg 150 mL/hr over 60 Minutes Intravenous Every 12 hours 09/22/23 0004 09/22/23 1039   09/22/23 0000  Vancomycin  (VANCOCIN ) 750 mg IVPB  Status:  Discontinued        750 mg 150 mL/hr over 60 Minutes Intravenous Every 12 hours 09/21/23 1120 09/21/23 2356   09/22/23 0000  vancomycin  (VANCOREADY) IVPB 750 mg/150 mL  Status:  Discontinued        750 mg 150 mL/hr over 60 Minutes Intravenous Every 12 hours 09/21/23 2356 09/22/23 0004   09/21/23 1300  vancomycin  (VANCOCIN ) IVPB 1000 mg/200 mL premix        1,000 mg 200 mL/hr over 60 Minutes Intravenous  Once 09/21/23 1120 09/21/23 1900   09/21/23 1215  piperacillin -tazobactam (ZOSYN ) IVPB 3.375 g  Status:  Discontinued        3.375 g 12.5 mL/hr over 240 Minutes Intravenous Every 8 hours 09/21/23 1120 09/23/23 1257   09/21/23 1130  vancomycin  (VANCOREADY) IVPB 1250 mg/250 mL  Status:  Discontinued        1,250 mg 166.7 mL/hr over 90 Minutes Intravenous  Once 09/21/23 1046 09/21/23 1120   09/21/23 1100  piperacillin -tazobactam (ZOSYN ) IVPB 3.375 g  Status:  Discontinued        3.375 g 12.5 mL/hr over 240 Minutes Intravenous  Once 09/21/23 1046 09/21/23 1152   09/13/23 1445  piperacillin -tazobactam (ZOSYN ) IVPB 3.375 g        3.375 g 12.5 mL/hr over 240 Minutes Intravenous Every 8 hours 09/13/23 1351 09/17/23 0157   09/10/23 2315  cefTRIAXone  (ROCEPHIN ) 1 g in sodium chloride  0.9 % 100 mL IVPB  Status:  Discontinued        1 g 200 mL/hr over 30 Minutes Intravenous Every 24 hours 09/10/23 2305 09/13/23 1336          REVIEW OF SYSTEMS   Patient reports anxiety and depression , denies CP, denies dyspnea despite visible shallow breathing, aside from this 10 point ROS is negative   PHYSICAL EXAMINATION:   GENERAL:chronically ill  appearing age appropriate EYES:  Pupils equal, round, reactive to light.  No scleral icterus.  MOUTH: Moist mucosal membrane. I NECK: Supple.  PULMONARY: Lungs clear to auscultation, +rhonchi in upper lobes CARDIOVASCULAR: S1 and S2.  Regular rate and rhythm GASTROINTESTINAL: Soft, nontender, -distended. Positive bowel sounds.  MUSCULOSKELETAL: edema.  NEUROLOGIC: mild confusion and aggitation SKIN:normal, warm to touch, Capillary refill delayed  Pulses present bilaterally      Interim History / Subjective:   1.Acute on chronic hypoxemic respiratory failure          RSV pneumonia      - Extubated 09/29/23     Leukocytosis trending up, have broadened coverage for post viral secondary PNA  2. Advanced COPD       Continue abx       S/p decadeon x 1 pre liberation , on on prednisone  taper  3. PAF - dcd heparin  gtt       Currentnly in SR  - on heprin sq  4. Substance abuse with drug withdrawal syndrome             Patient reports heavy cocaine smoking and EtOH and oxycontin  daily  ENDO -check FSBS per protocol     GI GI PROPHYLAXIS as indicated NUTRITIONAL STATUS DIET advance as tolerated.   Constipation protocol as indicated CT ABD ILEUS IMPROVING   ELECTROLYTES -follow labs as needed -replace as needed -pharmacy consultation and following   RESTRICTIVE TRANSFUSION PROTOCOL TRANSFUSION  IF HGB<7  or ACTIVE BLEEDING OR DX of ACUTE CORONARY SYNDROMES     CT ABD ILEUS resolved - placed NGT for nourishment       Best Practice (right click and Reselect all SmartList Selections daily)    Diet/type: TF's DVT prophylaxis heparin  Pressure ulcer(s): N/A GI prophylaxis: Pepcid   Lines: RUE PICC Line and still needed  Foley:  External Catheter  Code Status: DNR  Objective   Blood pressure 112/76, pulse 94, temperature 100.2 F (37.9 C), temperature source Oral, resp. rate (!) 23, height 6' (1.829 m), weight 51.5 kg, SpO2 99%.        Intake/Output Summary (Last  24 hours) at 10/16/2023 1138 Last data filed at 10/16/2023 0755 Gross per 24 hour  Intake 270 ml  Output 750 ml  Net -480 ml   Filed Weights   10/14/23 0600 10/15/23 0504 10/16/23 0507  Weight: 50.1 kg 48.5 kg 51.5 kg       I have discussed Plan of Care and interpretation of testing with patient as well as supervising physician required for management or comanagement of patient's concerns and medical findings.       Marval HERO. Birtie Fellman, NPc  Halina Picking, MD - Collaborating Physician  Pulmonary & Critical Care Medicine  [facility]   Halina Picking, M.D.  Pulmonary & Critical Care Medicine

## 2023-10-10 NOTE — Progress Notes (Signed)
       CROSS COVER NOTE  NAME: Benjamin Bolton MRN: [id number] DOB : 02-03-1961 ATTENDING PHYSICIAN: Caleen Qualia, MD    Date of Service   10/10/2023   HPI/Events of Note   Message received from nurse Pt has been Red MEWS since 1800 due to HR 112-134 w/ PVCs & tachypnea 106-134 shallow but doesn't appear in distress on 3L [place] 97%. Low BP 94/52 (64) & 83/53 (64) . He is getting TF @ goals but no other intake. He may need some fluids or bolus.   Interventions   Assessment/Plan:    10/09/2023   11:00 PM 10/09/2023   10:00 PM 10/09/2023    9:15 PM  Vitals with BMI  Systolic 94 90   Diastolic 52 60   Pulse 66 111 878   Labs revieiwed, does not appear low volume/dehydration Albumin  level only 2 Albumin  25 gms 25% IV x 1 dose Multiple protein supplements already ordered        Benjamin Bolton [facility] NP [facility] Cross Cover 7pm-7am - check amion for availability Pager [phone]

## 2023-10-10 NOTE — Progress Notes (Signed)
 Progress Note   [name] FMW:[id number] DOB: 04/14/1961 DOA: 09/10/2023     30 DOS: the patient was seen and examined on 10/10/2023   Brief hospital course: ICU transfer for 10/08/2023.  Taken from prior notes.  63 y.o. 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  Was successfully extubated on 1/27, currently on his baseline 3L nasal cannula, not requiring vasopressors.  Hospital course has been complicated questionable HAP and metabolic encephalopathy, which has limited his ability to participate with PT.  While he was Lucid after extubation, did make himself DNR/DNI in presence of family and chaplain.  He will remain full scope of medical care.  . Patient is chronically maintained on 20 mg of prednisone  which will continue for now but will require a prolonged taper. He is refusing certain aspects of his care which is challenging given his overall comorbidities.   Initial PT recommendations for CIR which were downgraded to SNF due to his ability to participate with physical therapy.  Might need LTAC placement.  Echocardiogram done on 09/14/2023 with low normal EF and indeterminate diastolic parameters.  Troponin peaked at 115.  Did receive pressors intermittently while in ICU.  Cardiology was consulted which later signed off and no further cardiac evaluation indicated at this time.  There was a questionable superimposed HAP which was treated with antibiotics.  Multiple electrolyte abnormalities which include mild hyperkalemia, hyponatremia and hypophosphatemia which were resolved with repletion.  2/5: Vital stable with borderline soft blood pressure, saturating 100% on 2 L of oxygen, temperature of 100.4 recorded over the past 24-hour.  Labs with slight  worsening of leukocytosis at 12.5, rest of the labs stable, CRP improving, CBG elevated. Sputum cultures from 1/22 with normal respiratory flora.  Patient is currently on Zosyn . Very muffled voice and weak cough.  Patient is quite debilitated and is appropriate for LTAC as recommended and PCCM note. Patient is on tube feed through NGT as having postintubation dysphagia.  2/6: Hemodynamically and labs seems stable, mild hypophosphatemia at 2.4.  Anemia panel consistent with anemia of chronic disease with low iron  and saturation, ferritin elevated.  Had a bed offer at select LTAC, pending insurance authorization.  Patient is reluctant for PEG tube, stating that improvement in dysphagia and he will like to try more p.o. intake. Small improvement in voice and cough reflex.  Completed the course of Zosyn .  2/7: Hemodynamically stable, slowly improving voice and cough.  Improving CRP.  Pending insurance authorization for LTAC.  Still does not want PEG tube.  Assessment and Plan: * Acute on chronic respiratory failure with hypoxia (HCC) COPD exacerbation-treated and resolved RSV pneumonia-treated Concern of superimposed HAP. S/p prolonged intubation with advanced underlying COPD. Currently saturating well on 2 L of oxygen which is his baseline. Postintubation muffled voice and weak cough. Currently on Zosyn  for for concern of HAP although tracheal cultures with normal respiratory flora.  Inflammatory markers seems improving. -Completed the course of Zosyn  today, 2/7 -Continue supplemental oxygen -Incentive spirometry and flutter valve -Chest PT -Continue with supportive care  COPD exacerbation (HCC) -Continue with prednisone  taper  DVT (deep venous thrombosis) (HCC) Patient has history of DVT. -Continue Eliquis   Protein-calorie malnutrition, severe (HCC) Patient is currently being fed through NG tube.  Having difficulty swallowing after intubation, seems slowly improving.  Patient  will try  to avoid PEG tube placement Swallow team is following and placed recommendations for dysphagia 1 diet. -Continue to monitor -Will continue with NG tube feeding until able to swallow  Electrolyte abnormality Patient had multiple electrolyte abnormalities during this complicated course of hospitalization which involved mild hyperkalemia, hyponatremia and hypophosphatemia which has been improved. -Continue to monitor-replete as needed  Normochromic anemia Hemoglobin currently stable at 8.7.  Likely due to chronic disease. Anemia panel consistent with anemia of chronic disease -Monitor hemoglobin -Transfuse if below 7  Pre-diabetes A1c of 6.4, CBG currently elevated. Patient is on tube feed and steroid. -Continue with SSI  Elevated troponin Echocardiogram done on 09/14/2023 with 50 to 55% EF, indeterminate diastolic parameter.  Likely secondary to demand ischemia.  Troponin peaked at 115 Initially cardiology was consulted and later the signed off, no intervention required.      Subjective: Patient continued to feel very weak.  Able to stand momentarily with the help of walker and PT today.  Still having some pain with swallowing but able to drink some orange juice and Ensure.  Does not want PEG tube.  Still talking and whisper but they are becoming little more louder.  Physical Exam: Vitals:   10/10/23 0400 10/10/23 0423 10/10/23 0500 10/10/23 0755  BP: 101/63  (!) 93/52   Pulse: 100  99   Resp: (!) 29  (!) 25   Temp:  98.9 F (37.2 C)  99.1 F (37.3 C)  TempSrc:    Oral  SpO2: 100%  100%   Weight:   54.4 kg   Height:       General.  Frail and severely malnourished gentleman, in no acute distress. Pulmonary.  Lungs clear bilaterally, normal respiratory effort. CV.  Regular rate and rhythm, no JVD, rub or murmur. Abdomen.  Soft, nontender, nondistended, BS positive. CNS.  Alert and oriented .  No focal neurologic deficit. Extremities.  No edema, no cyanosis, pulses intact  and symmetrical.   Data Reviewed: Prior data reviewed  Family Communication: Discussed with sister on phone  Disposition: Status is: Inpatient Remains inpatient appropriate because: Severity of illness  Planned Discharge Destination: LTAC  DVT prophylaxis.  Eliquis  Time spent: 44 minutes  This record has been created using Conservation officer, historic buildings. Errors have been sought and corrected,but may not always be located. Such creation errors do not reflect on the standard of care.   Author: Amaryllis Dare, MD 10/10/2023 2:11 PM  For on call review [email or web address].

## 2023-10-10 NOTE — Progress Notes (Signed)
 Elisabeth Guild, NP notified that Pt has been Red MEWS since 1800 due to HR 112-134 w/ PVCs & tachypnea 22-34 RR shallow but doesn't appear in distress on 3L [place] 97%. Low BP 94/52 (64) & 83/53 (64) . Albumin  ordered, see MAR

## 2023-10-10 NOTE — Consult Note (Signed)
 PHARMACY CONSULT NOTE - FOLLOW UP  Pharmacy Consult for Electrolyte Monitoring and Replacement   Recent Labs: Potassium (mmol/L)  Date Value  10/10/2023 3.5   Magnesium  (mg/dL)  Date Value  97/92/7974 2.0   Calcium (mg/dL)  Date Value  97/92/7974 8.5 (L)   Albumin  (g/dL)  Date Value  97/92/7974 2.7 (L)   Phosphorus (mg/dL)  Date Value  97/92/7974 3.4   Sodium (mmol/L)  Date Value  10/10/2023 138     Assessment: 63 year old male with severe COPD admitted with acute hypoxic respiratory failure secondary to RSV pneumonia requiring intubation. Extubated to BiPAP on 1/27 with improvement in respiratory status but persistent encephalopathy. Severe malnutrition in context of acute illness/injury. On Osmolite 1.5 @ 30 ml/hr and increase by 10 ml every 8 hours to goal rate of 60 ml/hr, 60 ml Prosource TF daily, 30 ml free water  flush every 4 hours. Monitor Na.   Goal of Therapy:  WNL  Plan:  Kcl 20 mEq x 1.  F/u with AM labs.   Cathaleen GORMAN Blanch ,PharmD Clinical Pharmacist 10/10/2023 7:46 AM

## 2023-10-10 NOTE — Progress Notes (Signed)
 Nutrition Follow-up  DOCUMENTATION CODES:   Severe malnutrition in context of acute illness/injury  INTERVENTION:   -TF via NGT:    Continue Osmolite 1.5 @ 60 ml/hr    60 ml Prosource TF daily   30 ml free water  flush every 4 hours   Tube feeding regimen provides 2240 kcal (100% of needs), 110 grams of protein, and 1097 ml of H2O.  Total free water : 1277 ml daily   -Continue Vitamin C  500mg  BID via tube -Continue Thiamine  100mg  dialy via tube x 7 days  -Continue Juven Fruit Punch BID via tube, each serving provides 95kcal and 2.5g of protein (amino acids glutamine and arginine) -Pt at high refeed risk; recommend monitor potassium, magnesium  and phosphorus labs daily until stable -Continue daily weights  -Pharmacy consult for electrolyte management  -If pt desires aggressive care and this aligns with goals of care, may need to consider permanent feeding acces (ex PEG) pending GOC discussions  NUTRITION DIAGNOSIS:   Severe Malnutrition related to acute illness as evidenced by moderate fat depletion, severe fat depletion, moderate muscle depletion, severe muscle depletion.  Ongoing  GOAL:   Patient will meet greater than or equal to 90% of their needs  Progressing   MONITOR:   Diet advancement, Labs, Weight trends, TF tolerance, Skin, I & O's  REASON FOR ASSESSMENT:   Consult Enteral/tube feeding initiation and management  ASSESSMENT:   63 y/o male with h/o COPD, DVT, HLD, GERD, pulmonary nodule and DM who is admitted with RSV, pneumonia and COPD exacerbation.  1/27- extubated  1/29- s/p BSE- advanced to dysphagia 1 diet with thin liquids 2/3- NGT placed, TF started  Reviewed I/O's: -1.3 L x 24 hours and +183 ml since 09/26/23  UOP: 2.7 L x 24 hours   Spoke with pt at bedside, who was more alert in comparison to previous visits. Pt reports feeling better today. However, pt agitated at time of visit and difficult to re-direct. Pt reports desire to take off  abdominal binder; RD tried to re-direct, however, unsuccessful. Pt dneies any nausea, vomiting or abdominal pain. Pt denies eating, as he does not like the food. Noted that SLP has signed off due to limited participation.   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   Palliative care following for goals of care discussions. Pt currently DNR with limited interventions.   Per TOC notes, plan for LTACH.   Medications reviewed and include vitamin C , protonix , prednisone , and thiamine .   Labs reviewed: CBGS: 131-240 (inpatient orders for glycemic control are 0-9 units insulin  aspart every 4 hours).    Diet Order:   Diet Order             DIET - DYS 1 Room service appropriate? Yes with Assist; Fluid consistency: Thin  Diet effective now                   EDUCATION NEEDS:   Education needs have been addressed  Skin:  Skin Assessment: Skin Integrity Issues: Skin Integrity Issues:: Unstageable Stage II: - Unstageable: sacrum Other: IAD to buttocks  Last BM:  10/10/23 (type 7)  Height:   Ht Readings from Last 1 Encounters:  09/10/23 6' (1.829 m)    Weight:   Wt Readings from Last 1 Encounters:  10/10/23 54.4 kg    Ideal Body Weight:  80.9 kg  BMI:  Body mass index is 16.27 kg/m.  Estimated Nutritional Needs:   Kcal:  1900-2200kcal/day  Protein:  95-110g/day  Fluid:  1.8-2.1L/day    Margery ORN, RD, LDN, CDCES Registered Dietitian III Certified Diabetes Care and Education Specialist If unable to reach this RD, please use RD Inpatient group chat on secure chat between hours of 8am-4 pm daily

## 2023-10-10 NOTE — Progress Notes (Signed)
 Per chart review, goals and plan remains set at this time. Pending LTACH placement.No ongoing acute palliative needs. PMT to sign off at this time. Please reconsult if plans/goals change. Attending made aware.  No Charge.  Waddell Lesches, DNP, AGNP-C Palliative Medicine  Please call Palliative Medicine team phone with any questions [phone]. For individual providers please see AMION.

## 2023-10-10 NOTE — Progress Notes (Signed)
 Physical Therapy Treatment Patient Details Name: Kaikoa Magro MRN: [id number] DOB: 03/26/1961 Today's Date: 10/10/2023   History of Present Illness 63 y.o. 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    PT Comments  Patient is agreeable to PT session. He is confused but cooperative. He is perseverative on getting a wheelchair for home but is redirectable. He was able to stand today x 2 bouts with +2 person assistance using rolling walker. Activity tolerance limited by fatigue. Recommend to continue PT to maximize independence and facilitate return to prior level of function.    If plan is discharge home, recommend the following: A lot of help with walking and/or transfers;A lot of help with bathing/dressing/bathroom;Assist for transportation;Assistance with cooking/housework;Help with stairs or ramp for entrance   Can travel by private vehicle     No  Equipment Recommendations       Recommendations for Other Services       Precautions / Restrictions Precautions Precautions: Fall Restrictions Weight Bearing Restrictions Per Provider Order: No     Mobility  Bed Mobility Overal bed mobility: Needs Assistance Bed Mobility: Supine to Sit, Sit to Supine     Supine to sit: Mod assist, Min assist, +2 for physical assistance Sit to supine: Min assist, Mod assist, +2 for physical assistance   General bed mobility comments: cues for sequencing and task initiation    Transfers Overall transfer level: Needs assistance Equipment used: Rolling walker (2 wheels) Transfers: Sit to/from Stand Sit to Stand: Min assist, Mod assist, +2 physical assistance           General transfer comment: 2 standing bouts performed. +2 person assistance required. cues for  anterior weight shifting    Ambulation/Gait                   Stairs             Wheelchair Mobility     Tilt Bed    Modified Rankin (Stroke Patients Only)       Balance Overall balance assessment: Needs assistance Sitting-balance support: Feet supported Sitting balance-Leahy Scale: Fair     Standing balance support: Bilateral upper extremity supported Standing balance-Leahy Scale: Zero Standing balance comment: standing tolerance of less than 10 seconds. +2 person assistance required                            Cognition Arousal: Alert Behavior During Therapy: Anxious Overall Cognitive Status: Impaired/Different from baseline Area of Impairment: Safety/judgement, Following commands, Orientation                 Orientation Level: Disoriented to, Situation, Time     Following Commands: Follows one step commands with increased time Safety/Judgement: Decreased awareness of deficits, Decreased awareness of safety     General Comments: talkative (low tone), perseverates on wanting a wheelchair and needs redirection        Exercises      General Comments        Pertinent Vitals/Pain Pain Assessment Pain Assessment: Faces Faces Pain Scale: Hurts little more Pain Location: throat Pain Descriptors / Indicators: Discomfort Pain Intervention(s): Monitored during session, Limited activity within patient's tolerance, Repositioned    Home Living  Prior Function            PT Goals (current goals can now be found in the care plan section) Acute Rehab PT Goals PT Goal Formulation: With patient Time For Goal Achievement: 10/14/23 Potential to Achieve Goals: Fair Progress towards PT goals: Progressing toward goals    Frequency    Min 1X/week      PT Plan      Co-evaluation PT/OT/SLP Co-Evaluation/Treatment: Yes Reason for Co-Treatment: For patient/therapist safety;To address  functional/ADL transfers PT goals addressed during session: Mobility/safety with mobility OT goals addressed during session: ADL's and self-care      AM-PAC PT 6 Clicks Mobility   Outcome Measure  Help needed turning from your back to your side while in a flat bed without using bedrails?: A Little Help needed moving from lying on your back to sitting on the side of a flat bed without using bedrails?: A Lot Help needed moving to and from a bed to a chair (including a wheelchair)?: A Lot Help needed standing up from a chair using your arms (e.g., wheelchair or bedside chair)?: Total Help needed to walk in hospital room?: Total Help needed climbing 3-5 steps with a railing? : Total 6 Click Score: 10    End of Session   Activity Tolerance: Patient limited by fatigue Patient left: in bed;with call bell/phone within reach;with bed alarm set Nurse Communication: Mobility status PT Visit Diagnosis: Unsteadiness on feet (R26.81);Muscle weakness (generalized) (M62.81);Difficulty in walking, not elsewhere classified (R26.2)     Time: 8972-8945 PT Time Calculation (min) (ACUTE ONLY): 27 min  Charges:    $Therapeutic Activity: 8-22 mins PT General Charges $$ ACUTE PT VISIT: 1 Visit                     Randine Essex, PT, MPT    [name] 10/10/2023, 1:16 PM

## 2023-10-10 NOTE — Progress Notes (Signed)
 Occupational Therapy Treatment Patient Details Name: Benjamin Bolton MRN: [id number] DOB: Mar 01, 1961 Today's Date: 10/10/2023   History of present illness 63 y.o. 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   OT comments  Pt participating in session with cues for encouragement, perseverates on getting a wheelchair but redirects easily. Bed mobility with +2 min-mod to transition to sitting EOB, stands 2x with RW and +2 assist at min-mod level. Pt fatigues quickly and does not tolerate standing for more than a few seconds. Discharge recommendation updated, OT will continue to follow.       If plan is discharge home, recommend the following:  Two people to help with walking and/or transfers;A lot of help with bathing/dressing/bathroom;Help with stairs or ramp for entrance;Assistance with cooking/housework;Assist for transportation;Assistance with feeding   Equipment Recommendations  Other (comment)       Precautions / Restrictions Precautions Precautions: Fall Restrictions Weight Bearing Restrictions Per Provider Order: No       Mobility Bed Mobility Overal bed mobility: Needs Assistance Bed Mobility: Supine to Sit, Sit to Supine     Supine to sit: Mod assist, Min assist, +2 for physical assistance Sit to supine: Min assist, Mod assist, +2 for physical assistance, +2 for safety/equipment        Transfers Overall transfer level: Needs assistance Equipment used: Rolling walker (2 wheels) Transfers: Sit to/from Stand Sit to Stand: Min assist, Mod assist           General transfer comment: 2x trials of standing, max encourgement to participate, cues for hand placement, tolerates standing for only a few seconds     Balance Overall balance assessment:  Needs assistance Sitting-balance support: Feet supported Sitting balance-Leahy Scale: Fair     Standing balance support: Bilateral upper extremity supported Standing balance-Leahy Scale: Zero Standing balance comment: significant external support required to perform partial stand/maintain standing balance                           ADL either performed or assessed with clinical judgement   ADL Overall ADL's : Needs assistance/impaired Eating/Feeding: Minimal assistance;Bed level Eating/Feeding Details (indicate cue type and reason): pt requesting to have OJ - OT provides pt with cup and pt able to drink small sips                                 Functional mobility during ADLs: +2 for physical assistance;+2 for safety/equipment;Rolling walker (2 wheels)        Cognition Arousal: Alert Behavior During Therapy: Anxious Overall Cognitive Status: Impaired/Different from baseline Area of Impairment: Safety/judgement, Following commands, Orientation                 Orientation Level: Disoriented to, Situation, Time     Following Commands: Follows one step commands with increased time Safety/Judgement: Decreased awareness of deficits, Decreased awareness of safety     General Comments: talkative, perseverates on wanting a wheelchair                   Pertinent Vitals/ Pain       Pain Assessment Pain Assessment: Faces Faces Pain Scale: Hurts little more Pain Location: thoat pain Pain Descriptors / Indicators: Discomfort, Sore  Pain Intervention(s): Limited activity within patient's tolerance, Monitored during session   Frequency  Min 1X/week        Progress Toward Goals  OT Goals(current goals can now be found in the care plan section)  Progress towards OT goals: Progressing toward goals  Acute Rehab OT Goals OT Goal Formulation: With patient Time For Goal Achievement: 10/15/23 Potential to Achieve Goals: Good ADL Goals Pt  Will Perform Grooming: with modified independence;sitting Pt Will Perform Lower Body Dressing: with min assist;sit to/from stand Pt Will Transfer to Toilet: with min assist;ambulating;bedside commode  Plan      Co-evaluation    PT/OT/SLP Co-Evaluation/Treatment: Yes Reason for Co-Treatment: For patient/therapist safety;To address functional/ADL transfers PT goals addressed during session: Mobility/safety with mobility OT goals addressed during session: ADL's and self-care      AM-PAC OT 6 Clicks Daily Activity     Outcome Measure   Help from another person eating meals?: A Lot Help from another person taking care of personal grooming?: A Lot Help from another person toileting, which includes using toliet, bedpan, or urinal?: Total Help from another person bathing (including washing, rinsing, drying)?: A Lot Help from another person to put on and taking off regular upper body clothing?: A Lot Help from another person to put on and taking off regular lower body clothing?: A Lot 6 Click Score: 11    End of Session Equipment Utilized During Treatment: Rolling walker (2 wheels);Oxygen  OT Visit Diagnosis: Other abnormalities of gait and mobility (R26.89);Muscle weakness (generalized) (M62.81)   Activity Tolerance Patient tolerated treatment well   Patient Left in bed;with call bell/phone within reach;with bed alarm set   Nurse Communication Mobility status        Time: 8973-8948 OT Time Calculation (min): 25 min  Charges: OT General Charges $OT Visit: 1 Visit OT Treatments $Self Care/Home Management : 8-22 mins  Daevon Holdren L. Shawnice Tilmon, OTR/L  10/10/23, 3:18 PM

## 2023-10-11 ENCOUNTER — Inpatient Hospital Stay: Payer: 59

## 2023-10-11 DIAGNOSIS — J441 Chronic obstructive pulmonary disease with (acute) exacerbation: Secondary | ICD-10-CM | POA: Diagnosis not present

## 2023-10-11 DIAGNOSIS — J9621 Acute and chronic respiratory failure with hypoxia: Secondary | ICD-10-CM | POA: Diagnosis not present

## 2023-10-11 DIAGNOSIS — E43 Unspecified severe protein-calorie malnutrition: Secondary | ICD-10-CM | POA: Diagnosis not present

## 2023-10-11 DIAGNOSIS — Z515 Encounter for palliative care: Secondary | ICD-10-CM | POA: Diagnosis not present

## 2023-10-11 LAB — C-REACTIVE PROTEIN: CRP: 5 mg/dL — ABNORMAL HIGH (ref <1.0)

## 2023-10-11 LAB — RENAL FUNCTION PANEL
Albumin: 2.7 g/dL — ABNORMAL LOW (ref 3.5–5.0)
Anion gap: 9 (ref 5–15)
BUN: 11 mg/dL (ref 8–23)
CO2: 29 mmol/L (ref 22–32)
Calcium: 8.6 mg/dL — ABNORMAL LOW (ref 8.9–10.3)
Chloride: 97 mmol/L — ABNORMAL LOW (ref 98–111)
Creatinine, Ser: 0.46 mg/dL — ABNORMAL LOW (ref 0.61–1.24)
GFR, Estimated: >60 mL/min (ref >60)
Glucose, Bld: 151 mg/dL — ABNORMAL HIGH (ref 70–99)
Phosphorus: 3 mg/dL (ref 2.5–4.6)
Potassium: 4.2 mmol/L (ref 3.5–5.1)
Sodium: 135 mmol/L (ref 135–145)

## 2023-10-11 LAB — CBC
HCT: 26.9 % — ABNORMAL LOW (ref 39.0–52.0)
Hemoglobin: 8.6 g/dL — ABNORMAL LOW (ref 13.0–17.0)
MCH: 29.9 pg (ref 26.0–34.0)
MCHC: 32 g/dL (ref 30.0–36.0)
MCV: 93.4 fL (ref 80.0–100.0)
Platelets: 407 10*3/uL — ABNORMAL HIGH (ref 150–400)
RBC: 2.88 MIL/uL — ABNORMAL LOW (ref 4.22–5.81)
RDW: 14.9 % (ref 11.5–15.5)
WBC: 11 10*3/uL — ABNORMAL HIGH (ref 4.0–10.5)
nRBC: 0.2 % (ref 0.0–0.2)

## 2023-10-11 LAB — GLUCOSE, CAPILLARY
Glucose-Capillary: 152 mg/dL — ABNORMAL HIGH (ref 70–99)
Glucose-Capillary: 140 mg/dL — ABNORMAL HIGH (ref 70–99)
Glucose-Capillary: 210 mg/dL — ABNORMAL HIGH (ref 70–99)
Glucose-Capillary: 270 mg/dL — ABNORMAL HIGH (ref 70–99)
Glucose-Capillary: 138 mg/dL — ABNORMAL HIGH (ref 70–99)

## 2023-10-11 LAB — MAGNESIUM: Magnesium: 2 mg/dL (ref 1.7–2.4)

## 2023-10-11 MED ORDER — OXYCODONE HCL 5 MG PO TABS
5.0000 mg | ORAL_TABLET | Freq: Four times a day (QID) | ORAL | Status: DC | PRN
  Administered 2023-10-11 – 2023-10-12 (×3): 5 mg via ORAL
  Filled 2023-10-11 (×3): qty 1

## 2023-10-11 MED ORDER — PANCRELIPASE (LIP-PROT-AMYL) 10440-39150 UNITS PO TABS
20880.0000 [IU] | ORAL_TABLET | Freq: Once | ORAL | Status: AC
  Administered 2023-10-11: 20880 [IU]
  Filled 2023-10-11 (×2): qty 2

## 2023-10-11 MED ORDER — SODIUM BICARBONATE 650 MG PO TABS
650.0000 mg | ORAL_TABLET | Freq: Once | ORAL | Status: AC
  Administered 2023-10-11: 650 mg
  Filled 2023-10-11: qty 1

## 2023-10-11 MED ORDER — LORAZEPAM 2 MG/ML IJ SOLN
0.5000 mg | Freq: Once | INTRAMUSCULAR | Status: AC
  Administered 2023-10-11: 0.5 mg via INTRAVENOUS
  Filled 2023-10-11: qty 1

## 2023-10-11 NOTE — Progress Notes (Signed)
 Progress Note   [name] FMW:[id number] DOB: 1960-09-16 DOA: 09/10/2023     31 DOS: the patient was seen and examined on 10/11/2023   Brief hospital course: ICU transfer for 10/08/2023.  Taken from prior notes.  63 y.o. 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  Was successfully extubated on 1/27, currently on his baseline 3L nasal cannula, not requiring vasopressors.  Hospital course has been complicated questionable HAP and metabolic encephalopathy, which has limited his ability to participate with PT.  While he was Lucid after extubation, did make himself DNR/DNI in presence of family and chaplain.  He will remain full scope of medical care.  . Patient is chronically maintained on 20 mg of prednisone  which will continue for now but will require a prolonged taper. He is refusing certain aspects of his care which is challenging given his overall comorbidities.   Initial PT recommendations for CIR which were downgraded to SNF due to his ability to participate with physical therapy.  Might need LTAC placement.  Echocardiogram done on 09/14/2023 with low normal EF and indeterminate diastolic parameters.  Troponin peaked at 115.  Did receive pressors intermittently while in ICU.  Cardiology was consulted which later signed off and no further cardiac evaluation indicated at this time.  There was a questionable superimposed HAP which was treated with antibiotics.  Multiple electrolyte abnormalities which include mild hyperkalemia, hyponatremia and hypophosphatemia which were resolved with repletion.  2/5: Vital stable with borderline soft blood pressure, saturating 100% on 2 L of oxygen, temperature of 100.4 recorded over the past 24-hour.  Labs with slight  worsening of leukocytosis at 12.5, rest of the labs stable, CRP improving, CBG elevated. Sputum cultures from 1/22 with normal respiratory flora.  Patient is currently on Zosyn . Very muffled voice and weak cough.  Patient is quite debilitated and is appropriate for LTAC as recommended and PCCM note. Patient is on tube feed through NGT as having postintubation dysphagia.  2/6: Hemodynamically and labs seems stable, mild hypophosphatemia at 2.4.  Anemia panel consistent with anemia of chronic disease with low iron  and saturation, ferritin elevated.  Had a bed offer at select LTAC, pending insurance authorization.  Patient is reluctant for PEG tube, stating that improvement in dysphagia and he will like to try more p.o. intake. Small improvement in voice and cough reflex.  Completed the course of Zosyn .  2/7: Hemodynamically stable, slowly improving voice and cough.  Improving CRP.  Pending insurance authorization for LTAC.  Still does not want PEG tube.  2/8: Oxycodone  added at his request due to complaint of generalized bodyaches.  Worsening cough and leukocytosis today, repeating chest x-ray.  Ordered a repeat swallow evaluation. Patient just completed course of Zosyn   Assessment and Plan: * Acute on chronic respiratory failure with hypoxia (HCC) COPD exacerbation-treated and resolved RSV pneumonia-treated Concern of superimposed HAP. S/p prolonged intubation with advanced underlying COPD. Currently saturating well on 2 L of oxygen which is his baseline. Postintubation muffled voice and weak cough. Currently on Zosyn  for for concern of HAP although tracheal cultures with normal respiratory flora.  Inflammatory markers seems improving. -Completed the course of Zosyn  today, 2/7 -Continue supplemental oxygen -Incentive spirometry and flutter valve -Chest PT -Continue with supportive care -Repeat swallow evaluation due to worsening cough -Repeat chest x-ray  COPD exacerbation  (HCC) -Continue with prednisone  taper  DVT (deep venous thrombosis) (HCC) Patient has history of DVT. -Continue Eliquis   Protein-calorie malnutrition, severe (HCC) Patient is currently being fed through NG tube.  Having difficulty swallowing after intubation, seems slowly improving.  Patient will try to avoid PEG tube placement Swallow team is following and placed recommendations for dysphagia 1 diet. -Continue to monitor -Will continue with NG tube feeding until able to swallow  Electrolyte abnormality Patient had multiple electrolyte abnormalities during this complicated course of hospitalization which involved mild hyperkalemia, hyponatremia and hypophosphatemia which has been improved. -Continue to monitor-replete as needed  Normochromic anemia Hemoglobin currently stable at 8.7.  Likely due to chronic disease. Anemia panel consistent with anemia of chronic disease -Monitor hemoglobin -Transfuse if below 7  Pre-diabetes A1c of 6.4, CBG currently elevated. Patient is on tube feed and steroid. -Continue with SSI  Elevated troponin Echocardiogram done on 09/14/2023 with 50 to 55% EF, indeterminate diastolic parameter.  Likely secondary to demand ischemia.  Troponin peaked at 115 Initially cardiology was consulted and later the signed off, no intervention required.      Subjective: Patient with worsening cough and congestion.  Improved cough, still talking in whispers with improving strength.  Increasing p.o. intake.  Complaining of generalized bodyaches and asking to add oxycodone  which he was taking before.  Physical Exam: Vitals:   10/11/23 0743 10/11/23 0800 10/11/23 1224 10/11/23 1225  BP:  (!) 92/57 98/72   Pulse:  (!) 101 (!) 101   Resp:  (!) 24 (!) 23   Temp: 99.2 F (37.3 C)   98.5 F (36.9 C)  TempSrc: Oral   Oral  SpO2:  100% 99% 99%  Weight:      Height:       General.  Frail and severely malnourished gentleman, in no acute distress. Pulmonary.  Lungs  clear bilaterally, normal respiratory effort. CV.  Regular rate and rhythm, no JVD, rub or murmur. Abdomen.  Soft, nontender, nondistended, BS positive. CNS.  Alert and oriented .  No focal neurologic deficit. Extremities.  No edema, no cyanosis, pulses intact and symmetrical.  Data Reviewed: Prior data reviewed  Family Communication:   Disposition: Status is: Inpatient Remains inpatient appropriate because: Severity of illness  Planned Discharge Destination: LTAC  DVT prophylaxis.  Eliquis  Time spent: 45 minutes  This record has been created using Conservation officer, historic buildings. Errors have been sought and corrected,but may not always be located. Such creation errors do not reflect on the standard of care.   Author: Amaryllis Dare, MD 10/11/2023 3:26 PM  For on call review [email or web address].

## 2023-10-11 NOTE — Assessment & Plan Note (Signed)
 Patient had multiple electrolyte abnormalities during this complicated course of hospitalization which involved mild hyperkalemia, hyponatremia and hypophosphatemia which has been improved. -Continue to monitor-replete as needed

## 2023-10-11 NOTE — Consult Note (Signed)
 PHARMACY CONSULT NOTE - FOLLOW UP  Pharmacy Consult for Electrolyte Monitoring and Replacement   Recent Labs: Potassium (mmol/L)  Date Value  10/11/2023 4.2   Magnesium  (mg/dL)  Date Value  97/91/7974 2.0   Calcium (mg/dL)  Date Value  97/91/7974 8.6 (L)   Albumin  (g/dL)  Date Value  97/91/7974 2.7 (L)   Phosphorus (mg/dL)  Date Value  97/91/7974 3.0   Sodium (mmol/L)  Date Value  10/11/2023 135     Assessment: 63 year old male with severe COPD admitted with acute hypoxic respiratory failure secondary to RSV pneumonia requiring intubation. Extubated to BiPAP on 1/27 with improvement in respiratory status but persistent encephalopathy. Severe malnutrition in context of acute illness/injury. On Osmolite 1.5 @ 30 ml/hr and increase by 10 ml every 8 hours to goal rate of 60 ml/hr, 60 ml Prosource TF daily, 30 ml free water  flush every 4 hours. Monitor Na.   Goal of Therapy:  WNL  Plan:  No electrolyte replacement at this time F/u with AM labs.   Suzann Allean LABOR ,PharmD Clinical Pharmacist 10/11/2023 9:02 AM

## 2023-10-11 NOTE — Plan of Care (Signed)
  Problem: Education: Goal: Knowledge of General Education information will improve Description: Including pain rating scale, medication(s)/side effects and non-pharmacologic comfort measures Outcome: Progressing   Problem: Health Behavior/Discharge Planning: Goal: Ability to manage health-related needs will improve Outcome: Progressing   Problem: Clinical Measurements: Goal: Ability to maintain clinical measurements within normal limits will improve Outcome: Progressing Goal: Will remain free from infection Outcome: Progressing Goal: Diagnostic test results will improve Outcome: Progressing Goal: Respiratory complications will improve Outcome: Progressing Goal: Cardiovascular complication will be avoided Outcome: Progressing   Problem: Activity: Goal: Risk for activity intolerance will decrease Outcome: Progressing   Problem: Nutrition: Goal: Adequate nutrition will be maintained Outcome: Progressing   Problem: Coping: Goal: Level of anxiety will decrease Outcome: Progressing   Problem: Elimination: Goal: Will not experience complications related to bowel motility Outcome: Progressing Goal: Will not experience complications related to urinary retention Outcome: Progressing   Problem: Education: Goal: Knowledge of disease or condition will improve Outcome: Progressing Goal: Knowledge of the prescribed therapeutic regimen will improve Outcome: Progressing

## 2023-10-11 NOTE — Plan of Care (Signed)
   Problem: Coping: Goal: Level of anxiety will decrease Outcome: Progressing   Problem: Elimination: Goal: Will not experience complications related to bowel motility Outcome: Progressing

## 2023-10-12 DIAGNOSIS — J9621 Acute and chronic respiratory failure with hypoxia: Secondary | ICD-10-CM | POA: Diagnosis not present

## 2023-10-12 DIAGNOSIS — R197 Diarrhea, unspecified: Secondary | ICD-10-CM | POA: Insufficient documentation

## 2023-10-12 DIAGNOSIS — I824Y9 Acute embolism and thrombosis of unspecified deep veins of unspecified proximal lower extremity: Secondary | ICD-10-CM | POA: Diagnosis not present

## 2023-10-12 DIAGNOSIS — E43 Unspecified severe protein-calorie malnutrition: Secondary | ICD-10-CM | POA: Diagnosis not present

## 2023-10-12 DIAGNOSIS — J441 Chronic obstructive pulmonary disease with (acute) exacerbation: Secondary | ICD-10-CM | POA: Diagnosis not present

## 2023-10-12 LAB — CBC
HCT: 28.8 % — ABNORMAL LOW (ref 39.0–52.0)
Hemoglobin: 9.3 g/dL — ABNORMAL LOW (ref 13.0–17.0)
MCH: 30.2 pg (ref 26.0–34.0)
MCHC: 32.3 g/dL (ref 30.0–36.0)
MCV: 93.5 fL (ref 80.0–100.0)
Platelets: 438 10*3/uL — ABNORMAL HIGH (ref 150–400)
RBC: 3.08 MIL/uL — ABNORMAL LOW (ref 4.22–5.81)
RDW: 15.3 % (ref 11.5–15.5)
WBC: 15.2 10*3/uL — ABNORMAL HIGH (ref 4.0–10.5)
nRBC: 0.1 % (ref 0.0–0.2)

## 2023-10-12 LAB — GLUCOSE, CAPILLARY
Glucose-Capillary: 170 mg/dL — ABNORMAL HIGH (ref 70–99)
Glucose-Capillary: 190 mg/dL — ABNORMAL HIGH (ref 70–99)
Glucose-Capillary: 197 mg/dL — ABNORMAL HIGH (ref 70–99)
Glucose-Capillary: 198 mg/dL — ABNORMAL HIGH (ref 70–99)
Glucose-Capillary: 207 mg/dL — ABNORMAL HIGH (ref 70–99)
Glucose-Capillary: 230 mg/dL — ABNORMAL HIGH (ref 70–99)

## 2023-10-12 LAB — RENAL FUNCTION PANEL
Albumin: 2.7 g/dL — ABNORMAL LOW (ref 3.5–5.0)
Anion gap: 12 (ref 5–15)
BUN: 18 mg/dL (ref 8–23)
CO2: 27 mmol/L (ref 22–32)
Calcium: 8.9 mg/dL (ref 8.9–10.3)
Chloride: 93 mmol/L — ABNORMAL LOW (ref 98–111)
Creatinine, Ser: 0.49 mg/dL — ABNORMAL LOW (ref 0.61–1.24)
GFR, Estimated: >60 mL/min (ref >60)
Glucose, Bld: 211 mg/dL — ABNORMAL HIGH (ref 70–99)
Phosphorus: 2.1 mg/dL — ABNORMAL LOW (ref 2.5–4.6)
Potassium: 4.2 mmol/L (ref 3.5–5.1)
Sodium: 132 mmol/L — ABNORMAL LOW (ref 135–145)

## 2023-10-12 LAB — MAGNESIUM: Magnesium: 2.1 mg/dL (ref 1.7–2.4)

## 2023-10-12 LAB — C-REACTIVE PROTEIN: CRP: 3.7 mg/dL — ABNORMAL HIGH (ref <1.0)

## 2023-10-12 MED ORDER — NICOTINE 14 MG/24HR TD PT24
14.0000 mg | MEDICATED_PATCH | Freq: Every day | TRANSDERMAL | Status: DC
  Administered 2023-10-12 – 2023-10-29 (×17): 14 mg via TRANSDERMAL
  Filled 2023-10-12 (×18): qty 1

## 2023-10-12 MED ORDER — POTASSIUM & SODIUM PHOSPHATES 280-160-250 MG PO PACK
2.0000 | PACK | Freq: Once | ORAL | Status: AC
  Administered 2023-10-12: 2 via ORAL
  Filled 2023-10-12 (×2): qty 2

## 2023-10-12 MED ORDER — LORAZEPAM 2 MG/ML IJ SOLN
0.5000 mg | Freq: Four times a day (QID) | INTRAMUSCULAR | Status: DC | PRN
  Administered 2023-10-12 – 2023-10-29 (×14): 0.5 mg via INTRAVENOUS
  Filled 2023-10-12 (×14): qty 1

## 2023-10-12 MED ORDER — PREDNISONE 10 MG PO TABS
10.0000 mg | ORAL_TABLET | Freq: Every day | ORAL | Status: DC
  Administered 2023-10-13 – 2023-10-15 (×3): 10 mg via ORAL
  Filled 2023-10-12 (×3): qty 1

## 2023-10-12 MED ORDER — MUSCLE RUB 10-15 % EX CREA
1.0000 | TOPICAL_CREAM | CUTANEOUS | Status: DC | PRN
  Administered 2023-10-12 – 2023-10-29 (×4): 1 via TOPICAL
  Filled 2023-10-12: qty 85

## 2023-10-12 MED ORDER — OXYCODONE HCL 5 MG PO TABS
5.0000 mg | ORAL_TABLET | ORAL | Status: DC | PRN
  Administered 2023-10-12 – 2023-10-24 (×42): 5 mg via ORAL
  Filled 2023-10-12 (×42): qty 1

## 2023-10-12 NOTE — Plan of Care (Signed)
  Problem: Safety: Goal: Ability to remain free from injury will improve Outcome: Progressing   Problem: Respiratory: Goal: Levels of oxygenation will improve Outcome: Progressing   Problem: Pain Management: Goal: General experience of comfort will improve Outcome: Not Progressing

## 2023-10-12 NOTE — Progress Notes (Signed)
 Speech Language Pathology Treatment: Dysphagia  Patient Details Name: Benjamin Bolton MRN: [id number] DOB: Oct 25, 1960 Today's Date: 10/12/2023 Time: 8854-8774 SLP Time Calculation (min) (ACUTE ONLY): 40 min  Assessment / Plan / Recommendation Clinical Impression  Pt seen for a repeat swallowing assessment and toleration of diet at bedside. Pt was awake/alert but no inclined to participate in po trials w/ this SLP -- I don't want it, put it over there. Pt has been taking some at meals per NSG report. He remains mostly in bed w/ his legs drawn up in the bed, Sedentary. Not moving around prevents full aeration in the lower lobes. Recent CXR 10/11/2023: Persistent but improved bilateral lower lobe pneumonia. On [place] O2 2L; afebrile. WBC elevated. NGT+.  OF NOTE: During the lengthy intubation period, there was concern of aspiration of TFs; also sepsis.   Pt appears to present w/ grossly functional oropharyngeal phase swallowing w/ No immediate, oropharyngeal phase dysphagia noted; No overt neuromuscular deficits noted w/ trials given. Pt consumed few po trials w/ No immediate, overt clinical s/s of aspiration during the trials.  Pt appears at reduced risk for aspiration when following aspiration precautions, using a modified diet consistency, and when given support at the meal. However, pt does have challenging factors that could impact oropharyngeal swallowing to include lengthy oral intubation/extubation, acute/chronic illness, deconditioning/weakness, and need for feeding support. These factors can increase risk for aspiration, dysphagia as well as decreased oral intake overall.    During the few po trials he Accepted b/f Declining further, pt consumed thin liquid, puree, and Minced/Moistened consistencies w/ no immediate, overt coughing, decline in baseline vocal quality, nor change in respiratory presentation during/post trials. He remains mostly dysphonic/aphonic w/ whispering. O2 sats remained 98%. No  change in pt's dysphonic vocal quality. Oral phase appeared grossly [facility] w/ timely bolus management, min mastication of ice chips and graham cracker trials, and control of bolus propulsion for A-P transfer for swallowing. Oral clearing achieved w/ all trial consistencies.  OM Exam appeared [facility] w/ no unilateral weakness noted. Speech Clear. Pt did not attempt to help feed self.     Recommend upgrade of diet to a MINCED diet (dysphagia level 2) w/ well-moistened foods; Thin liquids -- carefully monitor straw use and Pinch for small sips slowly, and pt should help to Hold Cup when drinking. Recommend general aspiration precautions, reduce talking/distractions during oral intake. Feeding Support at meals. Rest Breaks as needed. Pills CRUSHED in Puree for safer, easier swallowing -- now and for D/C.  Education given on Pills in Puree; food consistencies and prep currently; general aspiration precautions to pt and NSG. ST services will follow for toleration of diet, education.  MD/NSG updated, agreed. Recommend Dietician and Palliative Care f/u for support.       HPI HPI: Pt is a 63 y.o. 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   At initial admit, patient states that he has a shortness of breath for more than 3 days, which has been progressively worsening.  He has cough with thick mucus production.  He has chills, but no fever.  No chest pain.  Of note, patient is using 3 L oxygen normally at home, but was found to have acute respiratory distress, difficulty speaking in full sentence, oxygen desaturation to 80s, initially started on 5-6  L oxygen, which is weaned off to home level 3 L oxygen after giving IV Solu-Medrol  and nebulizer in ED.  Patient was admitted to [facility] and initiated on nebulizers, steroids,  and antibiotics. He was managed medically for COPD exacerbation and UTI but developed worsening respiratory distress this AM requiring the initiation of BiPAP. Rapid response was called and he was transferred to the ICU. Initial blood gas showed pH of 7.2, with repeat VBG showing he remained acidemic at 7.18. Given altered mental status and respiratory failure, decision made at the bedside to proceed with emergent intubation.  During the lengthy intubation period, there was concern of aspiration of TFs; also sepsis.  ENT was consulted for trach, PEG.  Palliative Care consulted; Family trying to decide if they want to proceed with tracheostomy.  Pt was extubated on 09/29/2023 per MD note: Pt extubated to bipap today. w/ no plan to reintubate per report.  On 09/30/23- patient is more awake and able to speak few words asking for water . per MD note.  A repeat ST consult was placed 10/11/23 for for Speech therapy to reassess -- Speech services had signed off d/t pt's POOR participation in tx and refusal of po's. .NGT present.      SLP Plan  Continue with current plan of care (po check, then s/o)      Recommendations for follow up therapy are one component of a multi-disciplinary discharge planning process, led by the attending physician.  Recommendations may be updated based on patient status, additional functional criteria and insurance authorization.    Recommendations  Diet recommendations: Dysphagia 2 (fine chop);Thin liquid Liquids provided via: Cup;Straw (monitor) Medication Administration: Crushed with puree (in NGT vs puree) Supervision: Staff to assist with self feeding;Full supervision/cueing for compensatory strategies Compensations: Small sips/bites;Slow rate;Minimize environmental distractions;Lingual sweep for clearance of pocketing;Follow solids with liquid (Dry swallow at times d/t presence of NGT) Postural Changes and/or Swallow Maneuvers: Out of bed for meals;Seated upright 90  degrees;Upright 30-60 min after meal                 (Palliative Care; Dietician) Oral care BID;Oral care before and after PO;Staff/trained caregiver to provide oral care   Frequent or constant Supervision/Assistance Dysphagia, unspecified (R13.10) (missing Dentition; No Desire for po's)     Continue with current plan of care (po check, then s/o)       Comer Portugal, MS, CCC-SLP Speech Language Pathologist Rehab Services; [facility] [place] [phone] (ascom) Somaya Grassi  10/12/2023, 3:11 PM

## 2023-10-12 NOTE — Assessment & Plan Note (Addendum)
Improved.  No abdominal pain but to have mild leukocytosis. High risk for C. Difficile. -Continue with supportive care

## 2023-10-12 NOTE — Consult Note (Signed)
 PHARMACY CONSULT NOTE - FOLLOW UP  Pharmacy Consult for Electrolyte Monitoring and Replacement   Recent Labs: Potassium (mmol/L)  Date Value  10/12/2023 4.2   Magnesium  (mg/dL)  Date Value  97/90/7974 2.1   Calcium (mg/dL)  Date Value  97/90/7974 8.9   Albumin  (g/dL)  Date Value  97/90/7974 2.7 (L)   Phosphorus (mg/dL)  Date Value  97/90/7974 2.1 (L)   Sodium (mmol/L)  Date Value  10/12/2023 132 (L)     Assessment: 63 year old male with severe COPD admitted with acute hypoxic respiratory failure secondary to RSV pneumonia requiring intubation. Extubated to BiPAP on 1/27 with improvement in respiratory status but persistent encephalopathy. Severe malnutrition in context of acute illness/injury. On Osmolite 1.5 @ 30 ml/hr and increase by 10 ml every 8 hours to goal rate of 60 ml/hr, 60 ml Prosource TF daily, 30 ml free water  flush every 4 hours. Monitor Na.   Goal of Therapy:  WNL  Plan:  Phos 2.1   Will order Phos- NaK 2 packets PO x 1  F/u electrolytes with AM labs.   Suzann Allean LABOR ,PharmD Clinical Pharmacist 10/12/2023 10:00 AM

## 2023-10-12 NOTE — Plan of Care (Signed)
  Problem: Education: Goal: Knowledge of General Education information will improve Description: Including pain rating scale, medication(s)/side effects and non-pharmacologic comfort measures Outcome: Progressing   Problem: Health Behavior/Discharge Planning: Goal: Ability to manage health-related needs will improve Outcome: Progressing   Problem: Clinical Measurements: Goal: Ability to maintain clinical measurements within normal limits will improve Outcome: Progressing Goal: Will remain free from infection Outcome: Progressing Goal: Diagnostic test results will improve Outcome: Progressing Goal: Respiratory complications will improve Outcome: Progressing Goal: Cardiovascular complication will be avoided Outcome: Progressing   Problem: Activity: Goal: Risk for activity intolerance will decrease Outcome: Progressing   Problem: Nutrition: Goal: Adequate nutrition will be maintained Outcome: Progressing   Problem: Coping: Goal: Level of anxiety will decrease Outcome: Progressing   Problem: Elimination: Goal: Will not experience complications related to bowel motility Outcome: Progressing Goal: Will not experience complications related to urinary retention Outcome: Progressing   Problem: Pain Management: Goal: General experience of comfort will improve Outcome: Progressing   Problem: Safety: Goal: Ability to remain free from injury will improve Outcome: Progressing   Problem: Skin Integrity: Goal: Risk for impaired skin integrity will decrease Outcome: Progressing   Problem: Activity: Goal: Ability to tolerate increased activity will improve Outcome: Progressing Goal: Will verbalize the importance of balancing activity with adequate rest periods Outcome: Progressing

## 2023-10-12 NOTE — Progress Notes (Signed)
 Progress Note   [name] FMW:[id number] DOB: [birth year] DOA: 09/10/2023     32 DOS: the patient was seen and examined on 10/12/2023   Brief hospital course: ICU transfer for 10/08/2023.  Taken from prior notes.  63 y.o. 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  Was successfully extubated on 1/27, currently on his baseline 3L nasal cannula, not requiring vasopressors.  Hospital course has been complicated questionable HAP and metabolic encephalopathy, which has limited his ability to participate with PT.  While he was Lucid after extubation, did make himself DNR/DNI in presence of family and chaplain.  He will remain full scope of medical care.  . Patient is chronically maintained on 20 mg of prednisone  which will continue for now but will require a prolonged taper. He is refusing certain aspects of his care which is challenging given his overall comorbidities.   Initial PT recommendations for CIR which were downgraded to SNF due to his ability to participate with physical therapy.  Might need LTAC placement.  Echocardiogram done on 09/14/2023 with low normal EF and indeterminate diastolic parameters.  Troponin peaked at 115.  Did receive pressors intermittently while in ICU.  Cardiology was consulted which later signed off and no further cardiac evaluation indicated at this time.  There was a questionable superimposed HAP which was treated with antibiotics.  Multiple electrolyte abnormalities which include mild hyperkalemia, hyponatremia and hypophosphatemia which were resolved with repletion.  2/5: Vital stable with borderline soft blood pressure, saturating 100% on 2 L of oxygen, temperature of 100.4 recorded over the past 24-hour.  Labs with slight  worsening of leukocytosis at 12.5, rest of the labs stable, CRP improving, CBG elevated. Sputum cultures from 1/22 with normal respiratory flora.  Patient is currently on Zosyn . Very muffled voice and weak cough.  Patient is quite debilitated and is appropriate for LTAC as recommended and PCCM note. Patient is on tube feed through NGT as having postintubation dysphagia.  2/6: Hemodynamically and labs seems stable, mild hypophosphatemia at 2.4.  Anemia panel consistent with anemia of chronic disease with low iron  and saturation, ferritin elevated.  Had a bed offer at select LTAC, pending insurance authorization.  Patient is reluctant for PEG tube, stating that improvement in dysphagia and he will like to try more p.o. intake. Small improvement in voice and cough reflex.  Completed the course of Zosyn .  2/7: Hemodynamically stable, slowly improving voice and cough.  Improving CRP.  Pending insurance authorization for LTAC.  Still does not want PEG tube.  2/8: Oxycodone  added at his request due to complaint of generalized bodyaches.  Worsening cough and leukocytosis today, repeating chest x-ray.  Ordered a repeat swallow evaluation. Patient just completed course of Zosyn .  2/9: Overnight quite agitated and unable to sleep.  Ativan  helped.  Complaining of generalized aches and pain, appears to have very poor insight of his condition.  Labs with mild pseudohyponatremia secondary to hyperglycemia.  Per patient no appetite but p.o. intake slowly improving. Repeat chest x-ray with persistent but improving bilateral pneumonia. CRP again started trending up.  Continue to have some diarrhea, ordered CBC as add-on  Assessment and Plan: * Acute on chronic respiratory failure with hypoxia (HCC) COPD exacerbation-treated and resolved RSV pneumonia-treated Concern of superimposed HAP. S/p prolonged intubation with advanced underlying COPD.  Currently saturating well on 2 L of oxygen which is his  baseline. Postintubation muffled voice and weak cough. Currently on Zosyn  for for concern of HAP although tracheal cultures with normal respiratory flora.  Inflammatory markers seems improving. -Completed the course of Zosyn  today, 2/7 -Continue supplemental oxygen -Incentive spirometry and flutter valve -Chest PT -Continue with supportive care -Repeat swallow evaluation due to worsening cough -Repeat chest x-ray-with persistent but improving bilateral pneumonia  COPD exacerbation (HCC) -Continue with prednisone  taper  DVT (deep venous thrombosis) (HCC) Patient has history of DVT. -Continue Eliquis   Protein-calorie malnutrition, severe (HCC) Patient is currently being fed through NG tube.  Having difficulty swallowing after intubation, seems slowly improving.  Patient will try to avoid PEG tube placement Swallow team is following and placed recommendations for dysphagia 1 diet. -Continue to monitor -Will continue with NG tube feeding until able to swallow  Electrolyte abnormality Patient had multiple electrolyte abnormalities during this complicated course of hospitalization which involved mild hyperkalemia, hyponatremia and hypophosphatemia which has been improved. -Continue to monitor-replete as needed  Normochromic anemia Hemoglobin currently stable at 8.7.  Likely due to chronic disease. Anemia panel consistent with anemia of chronic disease -Monitor hemoglobin -Transfuse if below 7  Pre-diabetes A1c of 6.4, CBG currently elevated. Patient is on tube feed and steroid. -Continue with SSI  Elevated troponin Echocardiogram done on 09/14/2023 with 50 to 55% EF, indeterminate diastolic parameter.  Likely secondary to demand ischemia.  Troponin peaked at 115 Initially cardiology was consulted and later the signed off, no intervention required.  Diarrhea Patient with worsening diarrhea, initially thought to be due to tube feed.  No abdominal pain but to have mild  leukocytosis. High risk for C. Difficile. Recheck CBC-if worsening leukocytosis we will ask ID to rule out C. Difficile. -Continue with supportive care      Subjective: Patient continued to have some cough and upper respiratory congestion.  Denies any abdominal pain but continued to have loose stool.  Appetite poor but able to tolerate some p.o. intake.  Remained on tube feeding.  Physical Exam: Vitals:   10/12/23 0511 10/12/23 0737 10/12/23 0800 10/12/23 0900  BP:  106/75 100/65 108/70  Pulse:      Resp:  (!) 27 (!) 30 (!) 26  Temp:  98.2 F (36.8 C)    TempSrc:  Oral    SpO2:  100%    Weight: 47.8 kg     Height:       General.  Frail and severely malnourished gentleman, in no acute distress. Pulmonary.  Lungs clear bilaterally, normal respiratory effort. CV.  Regular rate and rhythm, no JVD, rub or murmur. Abdomen.  Soft, nontender, nondistended, BS positive. CNS.  Alert and oriented .  No focal neurologic deficit. Extremities.  No edema, no cyanosis, pulses intact and symmetrical.  Data Reviewed: Prior data reviewed  Family Communication: Called sister with no response.  Disposition: Status is: Inpatient Remains inpatient appropriate because: Severity of illness  Planned Discharge Destination: LTAC  DVT prophylaxis.  Eliquis  Time spent: 50 minutes  This record has been created using Conservation officer, historic buildings. Errors have been sought and corrected,but may not always be located. Such creation errors do not reflect on the standard of care.   Author: Amaryllis Dare, MD 10/12/2023 12:43 PM  For on call review [email or web address].

## 2023-10-13 DIAGNOSIS — E43 Unspecified severe protein-calorie malnutrition: Secondary | ICD-10-CM | POA: Diagnosis not present

## 2023-10-13 DIAGNOSIS — I824Y9 Acute embolism and thrombosis of unspecified deep veins of unspecified proximal lower extremity: Secondary | ICD-10-CM | POA: Diagnosis not present

## 2023-10-13 DIAGNOSIS — J441 Chronic obstructive pulmonary disease with (acute) exacerbation: Secondary | ICD-10-CM | POA: Diagnosis not present

## 2023-10-13 DIAGNOSIS — J9621 Acute and chronic respiratory failure with hypoxia: Secondary | ICD-10-CM | POA: Diagnosis not present

## 2023-10-13 LAB — GLUCOSE, CAPILLARY
Glucose-Capillary: 129 mg/dL — ABNORMAL HIGH (ref 70–99)
Glucose-Capillary: 129 mg/dL — ABNORMAL HIGH (ref 70–99)
Glucose-Capillary: 136 mg/dL — ABNORMAL HIGH (ref 70–99)
Glucose-Capillary: 144 mg/dL — ABNORMAL HIGH (ref 70–99)
Glucose-Capillary: 158 mg/dL — ABNORMAL HIGH (ref 70–99)
Glucose-Capillary: 181 mg/dL — ABNORMAL HIGH (ref 70–99)
Glucose-Capillary: 186 mg/dL — ABNORMAL HIGH (ref 70–99)
Glucose-Capillary: 187 mg/dL — ABNORMAL HIGH (ref 70–99)

## 2023-10-13 LAB — CBC
HCT: 28 % — ABNORMAL LOW (ref 39.0–52.0)
Hemoglobin: 9.1 g/dL — ABNORMAL LOW (ref 13.0–17.0)
MCH: 29.6 pg (ref 26.0–34.0)
MCHC: 32.5 g/dL (ref 30.0–36.0)
MCV: 91.2 fL (ref 80.0–100.0)
Platelets: 367 10*3/uL (ref 150–400)
RBC: 3.07 MIL/uL — ABNORMAL LOW (ref 4.22–5.81)
RDW: 15.2 % (ref 11.5–15.5)
WBC: 11.7 10*3/uL — ABNORMAL HIGH (ref 4.0–10.5)
nRBC: 0.3 % — ABNORMAL HIGH (ref 0.0–0.2)

## 2023-10-13 LAB — RENAL FUNCTION PANEL
Albumin: 2.7 g/dL — ABNORMAL LOW (ref 3.5–5.0)
Anion gap: 9 (ref 5–15)
BUN: 19 mg/dL (ref 8–23)
CO2: 31 mmol/L (ref 22–32)
Calcium: 8.9 mg/dL (ref 8.9–10.3)
Chloride: 94 mmol/L — ABNORMAL LOW (ref 98–111)
Creatinine, Ser: 0.51 mg/dL — ABNORMAL LOW (ref 0.61–1.24)
GFR, Estimated: >60 mL/min (ref >60)
Glucose, Bld: 200 mg/dL — ABNORMAL HIGH (ref 70–99)
Phosphorus: 2.5 mg/dL (ref 2.5–4.6)
Potassium: 4.2 mmol/L (ref 3.5–5.1)
Sodium: 134 mmol/L — ABNORMAL LOW (ref 135–145)

## 2023-10-13 LAB — C-REACTIVE PROTEIN: CRP: 3.7 mg/dL — ABNORMAL HIGH (ref <1.0)

## 2023-10-13 MED ORDER — OSMOLITE 1.5 CAL PO LIQD
720.0000 mL | ORAL | Status: DC
  Administered 2023-10-13: 720 mL

## 2023-10-13 MED ORDER — METOPROLOL TARTRATE 5 MG/5ML IV SOLN
5.0000 mg | INTRAVENOUS | Status: AC | PRN
  Administered 2023-10-13 – 2023-10-14 (×2): 5 mg via INTRAVENOUS
  Filled 2023-10-13 (×2): qty 5

## 2023-10-13 MED ORDER — VITAMIN C 500 MG PO TABS
500.0000 mg | ORAL_TABLET | Freq: Two times a day (BID) | ORAL | Status: DC
  Administered 2023-10-13 – 2023-10-29 (×30): 500 mg via ORAL
  Filled 2023-10-13 (×30): qty 1

## 2023-10-13 MED ORDER — ADULT MULTIVITAMIN W/MINERALS CH
1.0000 | ORAL_TABLET | Freq: Every day | ORAL | Status: DC
  Administered 2023-10-13 – 2023-10-29 (×15): 1 via ORAL
  Filled 2023-10-13 (×15): qty 1

## 2023-10-13 MED ORDER — ACETAMINOPHEN 325 MG PO TABS
650.0000 mg | ORAL_TABLET | Freq: Four times a day (QID) | ORAL | Status: DC | PRN
  Administered 2023-10-13 – 2023-10-25 (×8): 650 mg via ORAL
  Filled 2023-10-13 (×8): qty 2

## 2023-10-13 MED ORDER — APIXABAN 5 MG PO TABS
5.0000 mg | ORAL_TABLET | Freq: Two times a day (BID) | ORAL | Status: DC
Start: 2023-10-13 — End: 2023-10-17
  Administered 2023-10-13 – 2023-10-16 (×8): 5 mg via ORAL
  Filled 2023-10-13 (×8): qty 1

## 2023-10-13 NOTE — Progress Notes (Addendum)
 Physical Therapy Treatment Patient Details Name: Benjamin Bolton MRN: [id number] DOB: 16-Feb-1961 Today's Date: 10/13/2023   History of Present Illness 63 y.o. 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 (Simultaneous filing. User may not have seen previous data.)    PT Comments  Patient supine on arrival and complaining of pain in R shoulder and ribs. Saturated in urine and required assistance for bed linen change. ModA to roll L/R with hand over hand guidance. Required min-modA+2 for supine<>sit. Maintained sitting on EOB with CGA. NG tube partially out, RN notified and charge RN in to assess. Discharge plan remains appropriate.     If plan is discharge home, recommend the following: A lot of help with walking and/or transfers;A lot of help with bathing/dressing/bathroom;Assist for transportation;Assistance with cooking/housework;Help with stairs or ramp for entrance   Can travel by private vehicle     No  Equipment Recommendations  Other (comment) (TBD)    Recommendations for Other Services       Precautions / Restrictions Precautions Precautions: Fall Restrictions Weight Bearing Restrictions Per Provider Order: No     Mobility  Bed Mobility Overal bed mobility: Needs Assistance Bed Mobility: Supine to Sit, Sit to Supine, Rolling Rolling: Mod assist   Supine to sit: Min assist, Mod assist, +2 for physical assistance, +2 for safety/equipment Sit to supine: Min assist, Mod assist, +2 for physical assistance, +2 for safety/equipment   General bed mobility comments: required hand over hand assist for rolling and bed mobility    Transfers                   General transfer comment: declined further mobility. NG tube partially coming out, RN  notified    Ambulation/Gait                   Stairs             Wheelchair Mobility     Tilt Bed    Modified Rankin (Stroke Patients Only)       Balance Overall balance assessment: Needs assistance Sitting-balance support: Feet supported Sitting balance-Leahy Scale: Fair Sitting balance - Comments: CGA for sitting balance                                    Cognition Arousal: Alert Behavior During Therapy: Anxious Overall Cognitive Status: Impaired/Different from baseline Area of Impairment: Safety/judgement, Following commands, Orientation                 Orientation Level: Disoriented to, Situation, Time     Following Commands: Follows one step commands with increased time Safety/Judgement: Decreased awareness of deficits, Decreased awareness of safety     General Comments: talkative with low tone and perseverating on needing surgery today        Exercises      General Comments General comments (skin integrity, edema, etc.): VSS-HR up to 126      Pertinent Vitals/Pain Pain Assessment Pain Assessment: Faces Faces Pain Scale: Hurts even more Pain Location: throat and back Pain Descriptors / Indicators: Discomfort Pain Intervention(s): Limited activity within patient's tolerance, Monitored during session, Repositioned    Home Living  Prior Function            PT Goals (current goals can now be found in the care plan section) Acute Rehab PT Goals Patient Stated Goal: to get out of here PT Goal Formulation: With patient Time For Goal Achievement: 10/14/23 Potential to Achieve Goals: Fair Progress towards PT goals: Progressing toward goals    Frequency    Min 1X/week      PT Plan      Co-evaluation PT/OT/SLP Co-Evaluation/Treatment: Yes Reason for Co-Treatment: For patient/therapist safety;To address functional/ADL transfers (Simultaneous filing. User may not have  seen previous data.) PT goals addressed during session: Mobility/safety with mobility (Simultaneous filing. User may not have seen previous data.) OT goals addressed during session: ADL's and self-care      AM-PAC PT "6 Clicks" Mobility   Outcome Measure  Help needed turning from your back to your side while in a flat bed without using bedrails?: A Little Help needed moving from lying on your back to sitting on the side of a flat bed without using bedrails?: A Lot Help needed moving to and from a bed to a chair (including a wheelchair)?: A Lot Help needed standing up from a chair using your arms (e.g., wheelchair or bedside chair)?: Total Help needed to walk in hospital room?: Total Help needed climbing 3-5 steps with a railing? : Total 6 Click Score: 10    End of Session   Activity Tolerance: Patient limited by fatigue Patient left: in bed;with call bell/phone within reach;with nursing/sitter in room Nurse Communication: Mobility status PT Visit Diagnosis: Unsteadiness on feet (R26.81);Muscle weakness (generalized) (M62.81);Difficulty in walking, not elsewhere classified (R26.2)     Time: 1610-9604 PT Time Calculation (min) (ACUTE ONLY): 24 min  Charges:    $Therapeutic Activity: 8-22 mins PT General Charges $$ ACUTE PT VISIT: 1 Visit                     Janine Melbourne, PT, DPT Physical Therapist - [facility]  [facility]    Terelle Dobler A Lathen Seal 10/13/2023, 2:58 PM

## 2023-10-13 NOTE — Progress Notes (Signed)
 Nutrition Follow-up  DOCUMENTATION CODES:   Severe malnutrition in context of acute illness/injury  INTERVENTION:   -48 hour calorie count -TF via NGT (transition to nocturnal feeds):    Continue Osmolite 1.5 @ 60 ml/hr x 12 hours   60 ml Prosource TF daily   30 ml free water  flush every 4 hours   Tube feeding regimen provides 1160 kcal (100% of needs), 65 grams of protein, and 548 ml of H2O.  Total free water : 723 ml daily   -Continue Vitamin C  500mg  BID via tube -Continue Thiamine  100mg  dialy via tube x 7 days  -Continue Juven Fruit Punch BID via tube, each serving provides 95kcal and 2.5g of protein (amino acids glutamine and arginine) -Pt at high refeed risk; recommend monitor potassium, magnesium  and phosphorus labs daily until stable -Continue daily weights  -Pharmacy consult for electrolyte management  -If pt desires aggressive care and this aligns with goals of care, may need to consider permanent feeding acces (ex PEG) pending GOC discussions   NUTRITION DIAGNOSIS:   Severe Malnutrition related to acute illness as evidenced by moderate fat depletion, severe fat depletion, moderate muscle depletion, severe muscle depletion.  Ongoing  GOAL:   Patient will meet greater than or equal to 90% of their needs  Unmet  MONITOR:   Diet advancement, Labs, Weight trends, TF tolerance, Skin, I & O's  REASON FOR ASSESSMENT:   Consult Enteral/tube feeding initiation and management  ASSESSMENT:   63 y/o male with h/o COPD, DVT, HLD, GERD, pulmonary nodule and DM who is admitted with RSV, pneumonia and COPD exacerbation.  1/27- extubated  1/29- s/p BSE- advanced to dysphagia 1 diet with thin liquids 2/3- NGT placed, TF started 2/9- s/p BSE- advanced to dysphagia 2 diet with thin liquids 2/10- s/p BSE- remain on dysphagia 2 diet with thin liquids  Reviewed I/O's: -1.6 L x 24 hours and -5 L since 09/29/23  UOP: 1.6 L x 24 hours  Pt resting in bed at time of visit.  TF have been off since yesterday due to concern for aspiration risk. Noted TF off, but remains with NGT. Pt has refused both lunch and breakfast meals and only took minimal PO trials with SLP.   Case discussed with RN, MD, SLP, and pharmacy. MD requesting transition to bolus feedings. RD discussed how this is not ideal due to small diameter of tube- tube will potentially get clogged and also have prolonged feeding administration. Plan for nocturnal feeds to help stimulate appetite. RD will also obtain calorie count to document oral intake. RD suspects that pt will continue to refuse oral intake. Per MD, pt does not want PEG. SLP and RD discussed potential to exchange to a small tube for comfort if plan to continue with NGT.    Reviewed wt hx; pt has experienced a 14.5% x 7 days, which is significant for time frame.   Per TOC notes, LTACH admission denied. MD started appeal for LTACH. Per [facility] liaison, will likely not be approved without confirmation of long term nutrition plan.   Medications reviewed and include vitamin C  and prednisone .   Labs reviewed: Na: 134, CBGS: 144-230 (inpatient orders for glycemic control are none).    Diet Order:   Diet Order             DIET DYS 2 Room service appropriate? Yes with Assist; Fluid consistency: Thin  Diet effective now  EDUCATION NEEDS:   Education needs have been addressed  Skin:  Skin Assessment: Skin Integrity Issues: Skin Integrity Issues:: Unstageable Stage II: - Unstageable: sacrum Other: IAD to buttocks  Last BM:  10/10/23 (type 7)  Height:   Ht Readings from Last 1 Encounters:  09/10/23 6' (1.829 m)    Weight:   Wt Readings from Last 1 Encounters:  10/13/23 49.5 kg    Ideal Body Weight:  80.9 kg  BMI:  Body mass index is 14.8 kg/m.  Estimated Nutritional Needs:   Kcal:  1900-2200kcal/day  Protein:  95-110g/day  Fluid:  1.8-2.1L/day    Herschel Lords, RD, LDN, CDCES Registered Dietitian  III Certified Diabetes Care and Education Specialist If unable to reach this RD, please use "RD Inpatient" group chat on secure chat between hours of 8am-4 pm daily

## 2023-10-13 NOTE — Consult Note (Signed)
 PHARMACY CONSULT NOTE - FOLLOW UP  Pharmacy Consult for Electrolyte Monitoring and Replacement   Recent Labs: Potassium (mmol/L)  Date Value  10/13/2023 4.2   Magnesium  (mg/dL)  Date Value  84/69/6295 2.1   Calcium (mg/dL)  Date Value  28/41/3244 8.9   Albumin  (g/dL)  Date Value  09/04/7251 2.7 (L)   Phosphorus (mg/dL)  Date Value  66/44/0347 2.5   Sodium (mmol/L)  Date Value  10/13/2023 134 (L)     Assessment: 63 year old male with severe COPD admitted with acute hypoxic respiratory failure secondary to RSV pneumonia requiring intubation. Extubated to BiPAP on 1/27 with improvement in respiratory status but persistent encephalopathy. Severe malnutrition in context of acute illness/injury. On Osmolite 1.5 @ 30 ml/hr and increase by 10 ml every 8 hours to goal rate of 60 ml/hr, 60 ml Prosource TF daily, 30 ml free water  flush every 4 hours. Monitor Na.   Goal of Therapy:  WNL  Plan:  Electrolytes are stable. Pharmacy will sign off. Please re-consult if needed.   Benjamin Bolton ,PharmD Clinical Pharmacist 10/13/2023 9:31 AM

## 2023-10-13 NOTE — Plan of Care (Signed)
   Problem: Education: Goal: Knowledge of General Education information will improve Description: Including pain rating scale, medication(s)/side effects and non-pharmacologic comfort measures Outcome: Progressing   Problem: Health Behavior/Discharge Planning: Goal: Ability to manage health-related needs will improve Outcome: Progressing   Problem: Clinical Measurements: Goal: Ability to maintain clinical measurements within normal limits will improve Outcome: Progressing Goal: Will remain free from infection Outcome: Progressing Goal: Diagnostic test results will improve Outcome: Progressing Goal: Respiratory complications will improve Outcome: Progressing Goal: Cardiovascular complication will be avoided Outcome: Progressing   Problem: Activity: Goal: Risk for activity intolerance will decrease Outcome: Progressing   Problem: Nutrition: Goal: Adequate nutrition will be maintained Outcome: Progressing   Problem: Coping: Goal: Level of anxiety will decrease Outcome: Progressing   Problem: Elimination: Goal: Will not experience complications related to bowel motility Outcome: Progressing Goal: Will not experience complications related to urinary retention Outcome: Progressing   Problem: Pain Management: Goal: General experience of comfort will improve Outcome: Progressing   Problem: Safety: Goal: Ability to remain free from injury will improve Outcome: Progressing   Problem: Skin Integrity: Goal: Risk for impaired skin integrity will decrease Outcome: Progressing   Problem: Education: Goal: Knowledge of disease or condition will improve Outcome: Progressing Goal: Knowledge of the prescribed therapeutic regimen will improve Outcome: Progressing   Problem: Activity: Goal: Ability to tolerate increased activity will improve Outcome: Progressing Goal: Will verbalize the importance of balancing activity with adequate rest periods Outcome: Progressing   Problem:  Respiratory: Goal: Ability to maintain a clear airway will improve Outcome: Progressing Goal: Levels of oxygenation will improve Outcome: Progressing Goal: Ability to maintain adequate ventilation will improve Outcome: Progressing   Problem: Education: Goal: Ability to describe self-care measures that may prevent or decrease complications (Diabetes Survival Skills Education) will improve Outcome: Progressing   Problem: Coping: Goal: Ability to adjust to condition or change in health will improve Outcome: Progressing   Problem: Fluid Volume: Goal: Ability to maintain a balanced intake and output will improve Outcome: Progressing   Problem: Health Behavior/Discharge Planning: Goal: Ability to identify and utilize available resources and services will improve Outcome: Progressing Goal: Ability to manage health-related needs will improve Outcome: Progressing   Problem: Metabolic: Goal: Ability to maintain appropriate glucose levels will improve Outcome: Progressing   Problem: Nutritional: Goal: Maintenance of adequate nutrition will improve Outcome: Progressing Goal: Progress toward achieving an optimal weight will improve Outcome: Progressing   Problem: Skin Integrity: Goal: Risk for impaired skin integrity will decrease Outcome: Progressing   Problem: Tissue Perfusion: Goal: Adequacy of tissue perfusion will improve Outcome: Progressing   Problem: Activity: Goal: Ability to tolerate increased activity will improve Outcome: Progressing   Problem: Respiratory: Goal: Ability to maintain a clear airway and adequate ventilation will improve Outcome: Progressing   Problem: Role Relationship: Goal: Method of communication will improve Outcome: Progressing

## 2023-10-13 NOTE — Progress Notes (Addendum)
 Occupational Therapy Treatment Patient Details Name: Benjamin Bolton MRN: [id number] DOB: 1961/04/21 Today's Date: 10/13/2023   History of present illness 63 y.o. 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 (Simultaneous filing. User may not have seen previous data.)   OT comments  Pt is supine in bed on arrival with linens soiled and purewick unhooked. PT/OT in for co-treat session to progress mobility. He reports pain to his R shoulder and R flank pain. Pt performed rolling to bil sides in bed with Mod A, able to hold bed rails and needs total assist to provide peri-care and gown change. Pt agreeable to sit at EOB, required Min/Mod A x2 for sequencing and initiation. NG tube began coming out, nurse notified and pt able to sit at EOB ~5 mins until returning to supine.  Pt returned to bed with all needs in place and will cont to require skilled acute OT services to maximize his safety and IND to return to PLOF.       If plan is discharge home, recommend the following:  Two people to help with walking and/or transfers;A lot of help with bathing/dressing/bathroom;Help with stairs or ramp for entrance;Assistance with cooking/housework;Assist for transportation;Assistance with feeding   Equipment Recommendations  Other (comment)    Recommendations for Other Services      Precautions / Restrictions Precautions Precautions: Fall Restrictions Weight Bearing Restrictions Per Provider Order: No       Mobility Bed Mobility Overal bed mobility: Needs Assistance Bed Mobility: Supine to Sit, Sit to Supine Rolling: Mod assist   Supine to sit: Mod assist, Min assist, +2 for physical assistance Sit to supine: Min assist, Mod assist, +2 for physical assistance   General bed  mobility comments: cues for sequencing and task initiation    Transfers                   General transfer comment: deferred d/t pain and NG tube coming out     Balance Overall balance assessment: Needs assistance Sitting-balance support: Feet supported Sitting balance-Leahy Scale: Fair Sitting balance - Comments: sitting balance improved this session with no external support required-CGA d/t NG tube coming out                                   ADL either performed or assessed with clinical judgement   ADL Overall ADL's : Needs assistance/impaired                             Toileting- Clothing Manipulation and Hygiene: Bed level;Total assistance              Extremity/Trunk Assessment              Vision       Perception     Praxis      Cognition Arousal: Alert Behavior During Therapy: Anxious Overall Cognitive Status: Impaired/Different from baseline Area of Impairment: Safety/judgement, Following commands, Orientation                 Orientation Level: Disoriented to, Situation, Time     Following Commands: Follows one step commands with increased time Safety/Judgement: Decreased awareness of deficits, Decreased awareness of safety  General Comments: talkative (low tone), limited by pain this session        Exercises Other Exercises Other Exercises: pt found soiled in urine d/t purewick unhooked; rolling performed to clean pt and change out linens    Shoulder Instructions       General Comments VSS-HR up to 126    Pertinent Vitals/ Pain       Pain Assessment Pain Assessment: Faces Faces Pain Scale: Hurts little more Pain Location: R shoulder and R flank region Pain Descriptors / Indicators: Discomfort Pain Intervention(s): Monitored during session, Repositioned, Patient requesting pain meds-RN notified  Home Living                                          Prior  Functioning/Environment              Frequency  Min 1X/week        Progress Toward Goals  OT Goals(current goals can now be found in the care plan section)  Progress towards OT goals: Progressing toward goals  Acute Rehab OT Goals Patient Stated Goal: improve strength OT Goal Formulation: With patient Time For Goal Achievement: 10/15/23 Potential to Achieve Goals: Good  Plan      Co-evaluation    PT/OT/SLP Co-Evaluation/Treatment: Yes Reason for Co-Treatment: For patient/therapist safety;To address functional/ADL transfers (Simultaneous filing. User may not have seen previous data.) PT goals addressed during session: Mobility/safety with mobility (Simultaneous filing. User may not have seen previous data.) OT goals addressed during session: ADL's and self-care      AM-PAC OT "6 Clicks" Daily Activity     Outcome Measure   Help from another person eating meals?: A Lot Help from another person taking care of personal grooming?: A Lot Help from another person toileting, which includes using toliet, bedpan, or urinal?: Total Help from another person bathing (including washing, rinsing, drying)?: A Lot Help from another person to put on and taking off regular upper body clothing?: A Lot Help from another person to put on and taking off regular lower body clothing?: A Lot 6 Click Score: 11    End of Session Equipment Utilized During Treatment: Oxygen  OT Visit Diagnosis: Other abnormalities of gait and mobility (R26.89);Muscle weakness (generalized) (M62.81)   Activity Tolerance Patient tolerated treatment well   Patient Left in bed;with call bell/phone within reach;with nursing/sitter in room   Nurse Communication Mobility status        Time: 1610-9604 OT Time Calculation (min): 25 min  Charges: OT General Charges $OT Visit: 1 Visit OT Treatments $Self Care/Home Management : 8-22 mins  Benjamin Bolton, OTR/L  10/13/23, 2:59 PM   Benjamin Bolton E  Benjamin Bolton 10/13/2023, 2:59 PM

## 2023-10-13 NOTE — Progress Notes (Signed)
 Speech Language Pathology Treatment: Dysphagia  Patient Details Name: Benjamin Bolton MRN: [id number] DOB: 09-22-1960 Today's Date: 10/13/2023 Time: 6295-2841 SLP Time Calculation (min) (ACUTE ONLY): 35 min  Assessment / Plan / Recommendation Clinical Impression  Pt seen for f/u w/ toleration of upgrade diet (now a Dysphagia level 2- minced foods) at bedside. Pt awakened/alerted adequately but not initially inclined to participate in po's w/ this SLP -- "I don't want it right now". Pt has a NGT w/ continuous TFs. Unsure of pt's actual oral intake at meals. He remains mostly in bed w/ his legs drawn up in the bed, Sedentary. Not moving around prevents full aeration in the lower lobes. Noted wet respirations at rest PRIOR TO any po's.  OF NOTE: During the lengthy intubation period, there was concern of aspiration of TFs; also sepsis.  On [place] O2 2L; afebrile. WBC elevated. NGT+ w/ continuous TFs -- pt lying flat in bed slid down..   Pt appears at reduced risk for aspiration when following aspiration precautions, using a modified diet consistency, and when given support at the meal as per tx session yesterday. However, pt does have challenging factors that could impact oropharyngeal swallowing to include lengthy oral intubation/extubation, acute/chronic illness, deconditioning/weakness, need for feeding support, and Declined Mental Status. These factors can increase risk for aspiration, dysphagia as well as decreased oral intake overall.    During the attempts at po trials, pt continued to REFUSE po's despite MAX encouragement and cues. No po trials were consumed, and pt stated "take the tray away" to the Dining services when they came to the room.   Education was then completed w/ pt on general aspiration and REFLUX precautions (HOB elevated 30+ degrees to reduce risk for REFLUX of TFs to occur. Pt was NOT able to repeat back the instructed precautions of sitting fully upright, small bites/sips slowly to  this SLP but stated "yes" when these were repeated to him. He often mumbled about his discomfort (back, throat - this is ongoing and NSG has been aware). Unsure of pt's Baseline Cognitive functioning.    Recommend continue current diet of a MINCED diet (dysphagia level 2) w/ well-moistened foods; Thin liquids -- carefully monitor straw use and Pinch for small sips slowly, and pt should help to Hold Cup when drinking. Recommend general aspiration precautions, reduce talking/distractions during oral intake. Feeding Support at meals. Rest Breaks as needed. Pills CRUSHED in Puree for safer, easier swallowing -- now and for D/C.  Education given on Pills in Puree; food consistencies and prep currently; general aspiration precautions to pt and NSG. ST services will follow for toleration of diet, education.   MD/NSG consulted re: concern for pt's overall oral intake(attempts at oral intake), large bore NGT impeding clearance of pharyngeal secretions/residue when eating, pt's Mental Status and Decline/Refusal of po's, and need for GOC discussion w/ MD/Palliative Care. MD agreed and will f/u w/ pt/Team for GOC; TFs/Tube. Recommend Dietician and Palliative Care f/u for support.     HPI HPI: Pt is a 62 y.o. 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   At initial admit, patient states that he has a shortness of breath for more than 3 days, which has been progressively worsening.  He has cough with thick mucus production.  He has chills, but no fever.  No chest pain.  Of note, patient is using 3 L oxygen normally at home, but was found to have acute respiratory distress, difficulty speaking in full sentence, oxygen desaturation to 80s, initially started on 5-6 L oxygen, which is weaned off to home level 3 L oxygen  after giving IV Solu-Medrol  and nebulizer in ED.  Patient was admitted to [facility] and initiated on nebulizers, steroids, and antibiotics. He was managed medically for COPD exacerbation and UTI but developed worsening respiratory distress this AM requiring the initiation of BiPAP. Rapid response was called and he was transferred to the ICU. Initial blood gas showed pH of 7.2, with repeat VBG showing he remained acidemic at 7.18. Given altered mental status and respiratory failure, decision made at the bedside to proceed with emergent intubation.  During the lengthy intubation period, there was concern of aspiration of TFs; also sepsis.  ENT was consulted for trach, PEG.  Palliative Care consulted; Family trying to decide if they want to proceed with tracheostomy.  Pt was extubated on 09/29/2023 per MD note: "Pt extubated to bipap today." w/ no plan to reintubate per report.  On 09/30/23- "patient is more awake and able to speak few words asking for water ." per MD note.  A repeat ST consult was placed 10/11/23 for for Speech therapy to reassess -- Speech services had signed off d/t pt's POOR participation in tx and refusal of po's. .NGT present.      SLP Plan  Continue with current plan of care (po check)      Recommendations for follow up therapy are one component of a multi-disciplinary discharge planning process, led by the attending physician.  Recommendations may be updated based on patient status, additional functional criteria and insurance authorization.    Recommendations  Diet recommendations: Dysphagia 2 (fine chop);Thin liquid Liquids provided via: Cup;Straw (monitor) Medication Administration: Crushed with puree Supervision: Staff to assist with self feeding;Full supervision/cueing for compensatory strategies Compensations: Small sips/bites;Slow rate;Minimize environmental distractions;Lingual sweep for clearance of pocketing;Follow solids with liquid (Dry swallow at times d/t presence of  NGT) Postural Changes and/or Swallow Maneuvers: Out of bed for meals;Seated upright 90 degrees;Upright 30-60 min after meal                 (Palliative Care for GOC; Dietician -- transition to dobhoff vs large bore NGT) Oral care BID;Oral care before and after PO;Staff/trained caregiver to provide oral care   Frequent or constant Supervision/Assistance Dysphagia, unspecified (R13.10) (missing Dentition; No Desire for po's)     Continue with current plan of care (po check)       Darla Edward, MS, CCC-SLP Speech Language Pathologist Rehab Services; [facility] [place] [phone] (ascom) Evalynn Hankins  10/13/2023, 12:25 PM

## 2023-10-13 NOTE — TOC Progression Note (Signed)
 Transition of Care [facility]) - Progression Note    Patient Details  Name: Benjamin Bolton MRN: [id number] Date of Birth: 01/17/1961  [facility]) CM/SW Contact  Baird Bombard, RN Phone Number: 10/13/2023, 12:24 PM  Clinical Narrative:   Per message received from [facility], [employer] [facility] [employer] peer to peer is being offered. The folling information was provided to MD by Ciera:  [phone], opt 5. This offer expires at 12:00 pm on 10/13/23.   12:20pm Per call retrieved patient was denied for [facility] admission. Information was provided to MD by Ciera.         Expected Discharge Plan and Services                                               Social Determinants of Health (SDOH) Interventions SDOH Screenings   Food Insecurity: No Food Insecurity (09/12/2023)  Recent Concern: Food Insecurity - Food Insecurity Present (08/25/2023)   Received from [facility]  Housing: Unknown (09/12/2023)  Transportation Needs: No Transportation Needs (09/12/2023)  Recent Concern: Transportation Needs - Unmet Transportation Needs (08/25/2023)   Received from [facility]  Utilities: Not At Risk (09/12/2023)  Recent Concern: Utilities - At Risk (07/23/2023)   Received from [facility]  Financial Resource Strain: Low Risk  (08/25/2023)   Received from [facility]  Recent Concern: Financial Resource Strain - Medium Risk (07/23/2023)   Received from [facility]  Physical Activity: Insufficiently Active (08/25/2023)   Received from [facility]  Social Connections: Socially Isolated (08/25/2023)   Received from [facility]  Stress: No Stress Concern Present (08/25/2023)   Received from [facility]  Tobacco Use: High Risk (09/25/2023)  Health Literacy: Inadequate Health Literacy (08/25/2023)   Received from [facility]    Readmission Risk Interventions     No data to display

## 2023-10-13 NOTE — Progress Notes (Signed)
 Progress Note   [name] ZOX:[id number] DOB: [birth year] DOA: 09/10/2023     33 DOS: the patient was seen and examined on 10/13/2023   Brief hospital course: ICU transfer for 10/08/2023.  Taken from prior notes.  63 y.o. 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  Was successfully extubated on 1/27, currently on his baseline 3L nasal cannula, not requiring vasopressors.  Hospital course has been complicated questionable HAP and metabolic encephalopathy, which has limited his ability to participate with PT.  While he was Lucid after extubation, did make himself DNR/DNI in presence of family and chaplain.  He will remain full scope of medical care.  . Patient is chronically maintained on 20 mg of prednisone  which will continue for now but will require a prolonged taper. He is refusing certain aspects of his care which is challenging given his overall comorbidities.   Initial PT recommendations for CIR which were downgraded to SNF due to his ability to participate with physical therapy.  Might need LTAC placement.  Echocardiogram done on 09/14/2023 with low normal EF and indeterminate diastolic parameters.  Troponin peaked at 115.  Did receive pressors intermittently while in ICU.  Cardiology was consulted which later signed off and no further cardiac evaluation indicated at this time.  There was a questionable superimposed HAP which was treated with antibiotics.  Multiple electrolyte abnormalities which include mild hyperkalemia, hyponatremia and hypophosphatemia which were resolved with repletion.  2/5: Vital stable with borderline soft blood pressure, saturating 100% on 2 L of oxygen, temperature of 100.4 recorded over the past 24-hour.  Labs with slight  worsening of leukocytosis at 12.5, rest of the labs stable, CRP improving, CBG elevated. Sputum cultures from 1/22 with normal respiratory flora.  Patient is currently on Zosyn . Very muffled voice and weak cough.  Patient is quite debilitated and is appropriate for LTAC as recommended and PCCM note. Patient is on tube feed through NGT as having postintubation dysphagia.  2/6: Hemodynamically and labs seems stable, mild hypophosphatemia at 2.4.  Anemia panel consistent with anemia of chronic disease with low iron  and saturation, ferritin elevated.  Had a bed offer at [facility], pending insurance authorization.  Patient is reluctant for PEG tube, stating that improvement in dysphagia and he will like to try more p.o. intake. Small improvement in voice and cough reflex.  Completed the course of Zosyn .  2/7: Hemodynamically stable, slowly improving voice and cough.  Improving CRP.  Pending insurance authorization for LTAC.  Still does not want PEG tube.  2/8: Oxycodone  added at his request due to complaint of generalized bodyaches.  Worsening cough and leukocytosis today, repeating chest x-ray.  Ordered a repeat swallow evaluation. Patient just completed course of Zosyn .  2/9: Overnight quite agitated and unable to sleep.  Ativan  helped.  Complaining of generalized aches and pain, appears to have very poor insight of his condition.  Labs with mild pseudohyponatremia secondary to hyperglycemia.  Per patient no appetite but p.o. intake slowly improving. Repeat chest x-ray with persistent but improving bilateral pneumonia. CRP again started trending up.  Continue to have some diarrhea, ordered CBC as add-on  2/10: LTAC placement was declined by insurance even with peer to peer review.  They want a definitive feeding plan in place.  Patient with poor appetite but able  to swallow now.  We will try giving him a break from NG tube, he was on a continuous field.  Might pull the NG tube out and see if he can  take adequate p.o. intake.  If he continued to need tube feeding then PEG will be the best option but patient does not want it.  Assessment and Plan: * Acute on chronic respiratory failure with hypoxia (HCC) COPD exacerbation-treated and resolved RSV pneumonia-treated Concern of superimposed HAP. S/p prolonged intubation with advanced underlying COPD. Currently saturating well on 2 L of oxygen which is his baseline. Postintubation muffled voice and weak cough. Currently on Zosyn  for for concern of HAP although tracheal cultures with normal respiratory flora.  Inflammatory markers seems improving. -Completed the course of Zosyn  today, 2/7 -Continue supplemental oxygen -Incentive spirometry and flutter valve -Chest PT -Continue with supportive care -Repeat swallow evaluation due to worsening cough -Repeat chest x-ray-with persistent but improving bilateral pneumonia  COPD exacerbation (HCC) -Continue with prednisone  taper  DVT (deep venous thrombosis) (HCC) Patient has history of DVT. -Continue Eliquis   Protein-calorie malnutrition, severe (HCC) Patient was not feeling hungry likely due to continuous tube feed.  Swallowing much improved.  Patient will try to avoid PEG tube placement Will try giving her a break from tube feeding and optimize p.o. intake -Continue to monitor   Electrolyte abnormality Patient had multiple electrolyte abnormalities during this complicated course of hospitalization which involved mild hyperkalemia, hyponatremia and hypophosphatemia which has been improved. -Continue to monitor-replete as needed  Normochromic anemia Hemoglobin currently stable at 8.7.  Likely due to chronic disease. Anemia panel consistent with anemia of chronic disease -Monitor hemoglobin -Transfuse if below 7  Pre-diabetes A1c of 6.4, CBG currently elevated. Patient is on tube feed and steroid. -Continue with SSI  Elevated troponin Echocardiogram done on 09/14/2023 with 50 to  55% EF, indeterminate diastolic parameter.  Likely secondary to demand ischemia.  Troponin peaked at 115 Initially cardiology was consulted and later the signed off, no intervention required.  Diarrhea Patient with worsening diarrhea, initially thought to be due to tube feed.  No abdominal pain but to have mild leukocytosis. High risk for C. Difficile. -Continue with supportive care      Subjective: Patient continued to have some wet cough, stating that he is not feeling hungry.  I agreed to hold tube feed to try by mouth.  Improving cough reflex, voice remained muffled.  Physical Exam: Vitals:   10/13/23 0200 10/13/23 0300 10/13/23 0400 10/13/23 0428  BP: 118/76 134/81 120/77   Pulse:      Resp: (!) 26 20 (!) 25   Temp:  98.5 F (36.9 C) 98.5 F (36.9 C)   TempSrc:  Oral Oral   SpO2:   100%   Weight:    49.5 kg  Height:       General.  Frail and severely malnourished gentleman in no acute distress. Pulmonary.  Lungs clear bilaterally, normal respiratory effort. CV.  Regular rate and rhythm, no JVD, rub or murmur. Abdomen.  Soft, nontender, nondistended, BS positive. CNS.  Alert and oriented .  No focal neurologic deficit. Extremities.  No edema, no cyanosis, pulses intact and symmetrical.   Data Reviewed: Prior data reviewed  Family Communication:   Disposition: Status is: Inpatient Remains inpatient appropriate because: Severity of illness  Planned Discharge Destination: LTAC  DVT prophylaxis.  Eliquis  Time spent: 50 minutes  This record has been created using Conservation officer, historic buildings. Errors have been sought and corrected,but may  not always be located. Such creation errors do not reflect on the standard of care.   Author: Luna Salinas, MD 10/13/2023 3:24 PM  For on call review [email or web address].

## 2023-10-13 NOTE — Plan of Care (Signed)
  Problem: Nutrition: Goal: Adequate nutrition will be maintained Outcome: Progressing   Problem: Education: Goal: Knowledge of General Education information will improve Description: Including pain rating scale, medication(s)/side effects and non-pharmacologic comfort measures Outcome: Not Progressing   Problem: Clinical Measurements: Goal: Ability to maintain clinical measurements within normal limits will improve Outcome: Not Progressing Goal: Will remain free from infection Outcome: Not Progressing   Problem: Pain Management: Goal: General experience of comfort will improve Outcome: Not Progressing

## 2023-10-14 ENCOUNTER — Inpatient Hospital Stay: Payer: 59

## 2023-10-14 DIAGNOSIS — I824Y9 Acute embolism and thrombosis of unspecified deep veins of unspecified proximal lower extremity: Secondary | ICD-10-CM | POA: Diagnosis not present

## 2023-10-14 DIAGNOSIS — J441 Chronic obstructive pulmonary disease with (acute) exacerbation: Secondary | ICD-10-CM | POA: Diagnosis not present

## 2023-10-14 DIAGNOSIS — R509 Fever, unspecified: Secondary | ICD-10-CM

## 2023-10-14 DIAGNOSIS — E43 Unspecified severe protein-calorie malnutrition: Secondary | ICD-10-CM | POA: Diagnosis not present

## 2023-10-14 DIAGNOSIS — J9621 Acute and chronic respiratory failure with hypoxia: Secondary | ICD-10-CM | POA: Diagnosis not present

## 2023-10-14 LAB — CBC
HCT: 30.9 % — ABNORMAL LOW (ref 39.0–52.0)
Hemoglobin: 9.8 g/dL — ABNORMAL LOW (ref 13.0–17.0)
MCH: 29.6 pg (ref 26.0–34.0)
MCHC: 31.7 g/dL (ref 30.0–36.0)
MCV: 93.4 fL (ref 80.0–100.0)
Platelets: 368 10*3/uL (ref 150–400)
RBC: 3.31 MIL/uL — ABNORMAL LOW (ref 4.22–5.81)
RDW: 15 % (ref 11.5–15.5)
WBC: 12.2 10*3/uL — ABNORMAL HIGH (ref 4.0–10.5)
nRBC: 0.2 % (ref 0.0–0.2)

## 2023-10-14 LAB — URINALYSIS, COMPLETE (UACMP) WITH MICROSCOPIC
Bacteria, UA: NONE SEEN
Bilirubin Urine: NEGATIVE
Glucose, UA: NEGATIVE mg/dL
Hgb urine dipstick: NEGATIVE
Ketones, ur: NEGATIVE mg/dL
Leukocytes,Ua: NEGATIVE
Nitrite: NEGATIVE
Protein, ur: NEGATIVE mg/dL
Specific Gravity, Urine: 1.021 (ref 1.005–1.030)
pH: 6 (ref 5.0–8.0)

## 2023-10-14 LAB — HEPATIC FUNCTION PANEL
ALT: 19 U/L (ref 0–44)
AST: 17 U/L (ref 15–41)
Albumin: 2.7 g/dL — ABNORMAL LOW (ref 3.5–5.0)
Alkaline Phosphatase: 72 U/L (ref 38–126)
Bilirubin, Direct: 0.1 mg/dL (ref 0.0–0.2)
Indirect Bilirubin: 0.5 mg/dL (ref 0.3–0.9)
Total Bilirubin: 0.6 mg/dL (ref 0.0–1.2)
Total Protein: 6.8 g/dL (ref 6.5–8.1)

## 2023-10-14 LAB — PROCALCITONIN: Procalcitonin: 0.15 ng/mL

## 2023-10-14 LAB — GLUCOSE, CAPILLARY
Glucose-Capillary: 109 mg/dL — ABNORMAL HIGH (ref 70–99)
Glucose-Capillary: 170 mg/dL — ABNORMAL HIGH (ref 70–99)
Glucose-Capillary: 185 mg/dL — ABNORMAL HIGH (ref 70–99)
Glucose-Capillary: 191 mg/dL — ABNORMAL HIGH (ref 70–99)
Glucose-Capillary: 193 mg/dL — ABNORMAL HIGH (ref 70–99)
Glucose-Capillary: 202 mg/dL — ABNORMAL HIGH (ref 70–99)
Glucose-Capillary: 209 mg/dL — ABNORMAL HIGH (ref 70–99)
Glucose-Capillary: 210 mg/dL — ABNORMAL HIGH (ref 70–99)

## 2023-10-14 LAB — RENAL FUNCTION PANEL
Albumin: 2.7 g/dL — ABNORMAL LOW (ref 3.5–5.0)
Anion gap: 11 (ref 5–15)
BUN: 24 mg/dL — ABNORMAL HIGH (ref 8–23)
CO2: 30 mmol/L (ref 22–32)
Calcium: 8.9 mg/dL (ref 8.9–10.3)
Chloride: 93 mmol/L — ABNORMAL LOW (ref 98–111)
Creatinine, Ser: 0.53 mg/dL — ABNORMAL LOW (ref 0.61–1.24)
GFR, Estimated: >60 mL/min (ref >60)
Glucose, Bld: 180 mg/dL — ABNORMAL HIGH (ref 70–99)
Phosphorus: 3.9 mg/dL (ref 2.5–4.6)
Potassium: 3.9 mmol/L (ref 3.5–5.1)
Sodium: 134 mmol/L — ABNORMAL LOW (ref 135–145)

## 2023-10-14 LAB — EXPECTORATED SPUTUM ASSESSMENT W GRAM STAIN, RFLX TO RESP C

## 2023-10-14 LAB — C-REACTIVE PROTEIN: CRP: 9.9 mg/dL — ABNORMAL HIGH (ref <1.0)

## 2023-10-14 MED ORDER — IOHEXOL 9 MG/ML PO SOLN
500.0000 mL | ORAL | Status: AC
  Administered 2023-10-14: 500 mL via ORAL

## 2023-10-14 MED ORDER — IOHEXOL 300 MG/ML  SOLN
80.0000 mL | Freq: Once | INTRAMUSCULAR | Status: AC | PRN
  Administered 2023-10-14: 80 mL via INTRAVENOUS

## 2023-10-14 MED ORDER — METOPROLOL TARTRATE 25 MG PO TABS
12.5000 mg | ORAL_TABLET | Freq: Two times a day (BID) | ORAL | Status: DC
  Administered 2023-10-14 – 2023-10-17 (×8): 12.5 mg via ORAL
  Filled 2023-10-14 (×8): qty 1

## 2023-10-14 MED ORDER — PIPERACILLIN-TAZOBACTAM 3.375 G IVPB
3.3750 g | Freq: Three times a day (TID) | INTRAVENOUS | Status: DC
  Administered 2023-10-14 – 2023-10-15 (×4): 3.375 g via INTRAVENOUS
  Filled 2023-10-14 (×4): qty 50

## 2023-10-14 NOTE — Consult Note (Signed)
Pharmacy Antibiotic Note  Benjamin Bolton is a 63 y.o. male admitted on 09/10/2023 with pneumonia.  Pharmacy has been consulted for Pip/tazo dosing. Spiked fever 2/10. Was positive for RSV.   Plan: Zosyn 3.375g IV q8h (4 hour infusion).  Height: 6' (182.9 cm) Weight: 50.1 kg (110 lb 7.2 oz) IBW/kg (Calculated) : 77.6  Temp (24hrs), Avg:99.2 F (37.3 C), Min:98.3 F (36.8 C), Max:102.2 F (39 C)  Recent Labs  Lab 10/09/23 0614 10/10/23 0549 10/11/23 0801 10/12/23 0832 10/12/23 1304 10/13/23 0814 10/14/23 0241  WBC 10.5  --  11.0*  --  15.2* 11.7* 12.2*  CREATININE 0.44* 0.48* 0.46* 0.49*  --  0.51* 0.53*    Estimated Creatinine Clearance: 68.7 mL/min (A) (by C-G formula based on SCr of 0.53 mg/dL (L)).    No Active Allergies  Antimicrobials this admission: Pt has received cefepime, pip/tazo, azithromycin and vancomycin during this admission.   Dose adjustments this admission: None   Microbiology results: 2/11 BCx: pending Multiple Sputum: negative    Thank you for allowing pharmacy to be a part of this patient's care.  Ronnald Ramp, PharmD, BCPS 10/14/2023 2:33 PM

## 2023-10-14 NOTE — Progress Notes (Signed)
Progress Note   [name] HYQ:[id number] DOB: [birth year] DOA: 09/10/2023     34 DOS: the patient was seen and examined on 10/14/2023   Brief hospital course: ICU transfer for 10/08/2023.  Taken from prior notes.  63 y.o. 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  Was successfully extubated on 1/27, currently on his baseline 3L nasal cannula, not requiring vasopressors.  Hospital course has been complicated questionable HAP and metabolic encephalopathy, which has limited his ability to participate with PT.  While he was Lucid after extubation, did make himself DNR/DNI in presence of family and chaplain.  He will remain full scope of medical care.  . Patient is chronically maintained on 20 mg of prednisone which will continue for now but will require a prolonged taper. He is refusing certain aspects of his care which is challenging given his overall comorbidities.   Initial PT recommendations for CIR which were downgraded to SNF due to his ability to participate with physical therapy.  Might need LTAC placement.  Echocardiogram done on 09/14/2023 with low normal EF and indeterminate diastolic parameters.  Troponin peaked at 115.  Did receive pressors intermittently while in ICU.  Cardiology was consulted which later signed off and no further cardiac evaluation indicated at this time.  There was a questionable superimposed HAP which was treated with antibiotics.  Multiple electrolyte abnormalities which include mild hyperkalemia, hyponatremia and hypophosphatemia which were resolved with repletion.  2/5: Vital stable with borderline soft blood pressure, saturating 100% on 2 L of oxygen, temperature of 100.4 recorded over the past 24-hour.  Labs with slight  worsening of leukocytosis at 12.5, rest of the labs stable, CRP improving, CBG elevated. Sputum cultures from 1/22 with normal respiratory flora.  Patient is currently on Zosyn. Very muffled voice and weak cough.  Patient is quite debilitated and is appropriate for LTAC as recommended and PCCM note. Patient is on tube feed through NGT as having postintubation dysphagia.  2/6: Hemodynamically and labs seems stable, mild hypophosphatemia at 2.4.  Anemia panel consistent with anemia of chronic disease with low iron and saturation, ferritin elevated.  Had a bed offer at [facility], pending insurance authorization.  Patient is reluctant for PEG tube, stating that improvement in dysphagia and he will like to try more p.o. intake. Small improvement in voice and cough reflex.  Completed the course of Zosyn.  2/7: Hemodynamically stable, slowly improving voice and cough.  Improving CRP.  Pending insurance authorization for LTAC.  Still does not want PEG tube.  2/8: Oxycodone added at his request due to complaint of generalized bodyaches.  Worsening cough and leukocytosis today, repeating chest x-ray.  Ordered a repeat swallow evaluation. Patient just completed course of Zosyn.  2/9: Overnight quite agitated and unable to sleep.  Ativan helped.  Complaining of generalized aches and pain, appears to have very poor insight of his condition.  Labs with mild pseudohyponatremia secondary to hyperglycemia.  Per patient no appetite but p.o. intake slowly improving. Repeat chest x-ray with persistent but improving bilateral pneumonia. CRP again started trending up.  Continue to have some diarrhea, ordered CBC as add-on  2/10: LTAC placement was declined by insurance even with peer to peer review.  They want a definitive feeding plan in place.  Patient with poor appetite but able  to swallow now.  We will try giving him a break from NG tube, he was on a continuous field.  Might pull the NG tube out and see if he can  take adequate p.o. intake.  If he continued to need tube feeding then PEG will be the best option but patient does not want it.  2/11: Patient became febrile again overnight, maximum temperature recorded of 102.  CRP again started trending up, slight worsening of leukocytosis, repeat chest x-ray with increasing infiltrate.  Patient recently received multiple courses of antibiotic.  UA does not look infected.  Infectious disease was also consulted.  Blood, sputum and wound cultures ordered.  Starting on Zosyn Calorie count with p.o. intake started, tube feeding switched to bolus if he is unable to eat p.o. Appeal was made for [facility] decision.  Assessment and Plan: * Acute on chronic respiratory failure with hypoxia (HCC) COPD exacerbation-treated and resolved RSV pneumonia-treated Concern of superimposed HAP. S/p prolonged intubation with advanced underlying COPD. Currently saturating well on 2 L of oxygen which is his baseline. Postintubation muffled voice and weak cough. Currently on Zosyn for for concern of HAP although tracheal cultures with normal respiratory flora.  Inflammatory markers seems improving. -Completed the course of Zosyn today, 2/7 -Continue supplemental oxygen -Incentive spirometry and flutter valve -Chest PT -Continue with supportive care -Repeat swallow evaluation due to worsening cough -Repeat chest x-ray-with persistent but improving bilateral pneumonia  Fever Patient became febrile again with worsening leukocytosis and CRP.  Repeat chest x-ray today with worsening infiltrate, ordered repeat blood, sputum and wound cultures.  Diarrhea has been resolved.  UA does not look infected. ID consult Follow-up cultures Starting on Zosyn  COPD exacerbation (HCC) -Continue with prednisone taper  DVT (deep venous thrombosis) (HCC) Patient has history of DVT. -Continue Eliquis  Protein-calorie malnutrition, severe (HCC)  Swallowing much improved.  Patient will try to  avoid PEG tube placement Started calorie count as p.o. intake slowly improving Continues tube feed has been switched with bolus if he is unable to take much p.o. -Continue to monitor   Electrolyte abnormality Patient had multiple electrolyte abnormalities during this complicated course of hospitalization which involved mild hyperkalemia, hyponatremia and hypophosphatemia which has been improved. -Continue to monitor-replete as needed  Normochromic anemia Hemoglobin currently stable at 8.7.  Likely due to chronic disease. Anemia panel consistent with anemia of chronic disease -Monitor hemoglobin -Transfuse if below 7  Pre-diabetes A1c of 6.4, CBG currently elevated. Patient is on tube feed and steroid. -Continue with SSI  Elevated troponin Echocardiogram done on 09/14/2023 with 50 to 55% EF, indeterminate diastolic parameter.  Likely secondary to demand ischemia.  Troponin peaked at 115 Initially cardiology was consulted and later the signed off, no intervention required.  Diarrhea Improved.  No abdominal pain but to have mild leukocytosis. High risk for C. Difficile. -Continue with supportive care      Subjective: Patient continued to have significant cough and not feeling well when seen today.  Physical Exam: Vitals:   10/14/23 0500 10/14/23 0600 10/14/23 1128 10/14/23 1418  BP: 105/68 112/75 103/71   Pulse:   (!) 120   Resp: (!) 22 (!) 25    Temp:   98.8 F (37.1 C)   TempSrc:   Axillary   SpO2:   100% 100%  Weight:  50.1 kg    Height:       General.  Frail and severely malnourished gentleman, in no acute distress. Pulmonary.  Lungs clear bilaterally, upper respiratory  transmitted sounds, normal respiratory effort. CV.  Regular rate and rhythm, no JVD, rub or murmur. Abdomen.  Soft, nontender, nondistended, BS positive. CNS.  Alert and oriented .  No focal neurologic deficit. Extremities.  No edema, no cyanosis, pulses intact and symmetrical.  Data  Reviewed: Prior data reviewed  Family Communication: Called Sister with no response  Disposition: Status is: Inpatient Remains inpatient appropriate because: Severity of illness  Planned Discharge Destination: LTAC  DVT prophylaxis.  Eliquis Time spent: 50 minutes  This record has been created using Conservation officer, historic buildings. Errors have been sought and corrected,but may not always be located. Such creation errors do not reflect on the standard of care.   Author: Arnetha Courser, MD 10/14/2023 2:23 PM  For on call review [email or web address].

## 2023-10-14 NOTE — Assessment & Plan Note (Signed)
Patient became febrile again with worsening leukocytosis and CRP.  Repeat chest x-ray today with worsening infiltrate, ordered repeat blood, sputum and wound cultures.  Diarrhea has been resolved.  UA does not look infected. ID consult Follow-up cultures Starting on Zosyn

## 2023-10-14 NOTE — Consult Note (Signed)
 NAME: Benjamin Bolton  DOB: [birth year]  MRN: [id number]  Date/Time: 10/14/2023 4:29 PM  REQUESTING PROVIDER Subjective:  REASON FOR CONSULT:  ? Benjamin Bolton is a 63 y.o. with a history of COPD, R    ID   Steroid/immune suppressants/splenectomy/Hardware Recent Procedure Surgery Injections Trauma Sick contacts Travel Antibiotic use Food- raw/exotic Animal bites Tick exposure Water sports Fishing/hunting/animal bird exposure Past Medical History:  Diagnosis Date   COPD (chronic obstructive pulmonary disease) (HCC)    DVT (deep venous thrombosis) (HCC)    Dyspnea    GERD (gastroesophageal reflux disease)    no meds   HLD (hyperlipidemia)    Hx of migraines    Oxygen dependent    3 L [place]   Pneumonia 2023   Pre-diabetes    Pulmonary nodule    Rotator cuff tear, right    Tobacco abuse    Tobacco use     Past Surgical History:  Procedure Laterality Date   CHEST TUBE INSERTION     SHOULDER ARTHROSCOPY WITH SUBACROMIAL DECOMPRESSION, ROTATOR CUFF REPAIR AND BICEP TENDON REPAIR Right 02/18/2023   Procedure: RIGHT SHOULDER ARTHROSCOPY WITH DEBRIDEMENT, DECOMPRESSION, ROTATOR CUFF REPAIR, BICEPS TENODESIS.;  Surgeon: Christena Flake, MD;  Location: [facility] ORS;  Service: Orthopedics;  Laterality: Right;    Social History   Socioeconomic History   Marital status: Single    Spouse name: Not on file   Number of children: Not on file   Years of education: Not on file   Highest education level: Not on file  Occupational History   Not on file  Tobacco Use   Smoking status: Some Days    Current packs/day: 0.25    Average packs/day: 0.3 packs/day for 48.0 years (12.0 ttl pk-yrs)    Types: Cigarettes   Smokeless tobacco: Never  Vaping Use   Vaping status: Never Used  Substance and Sexual Activity   Alcohol use: Yes    Comment: occ   Drug use: Never   Sexual activity: Not on file  Other Topics Concern   Not on file  Social History Narrative   Not on file   Social  Drivers of Health   Financial Resource Strain: Low Risk  (08/25/2023)   Received from [facility]   Overall Financial Resource Strain (CARDIA)    Difficulty of Paying Living Expenses: Not very hard  Recent Concern: Financial Resource Strain - Medium Risk (07/23/2023)   Received from [facility]   Overall Financial Resource Strain (CARDIA)    Difficulty of Paying Living Expenses: Somewhat hard  Food Insecurity: No Food Insecurity (09/12/2023)   Hunger Vital Sign    Worried About Running Out of Food in the Last Year: Never true    Ran Out of Food in the Last Year: Never true  Recent Concern: Food Insecurity - Food Insecurity Present (08/25/2023)   Received from [facility]   Hunger Vital Sign    Worried About Running Out of Food in the Last Year: Often true    Ran Out of Food in the Last Year: Often true  Transportation Needs: No Transportation Needs (09/12/2023)   PRAPARE - Administrator, Civil Service (Medical): No    Lack of Transportation (Non-Medical): No  Recent Concern: Transportation Needs - Unmet Transportation Needs (08/25/2023)   Received from [facility] - Transportation    In the past 12 months, has lack of transportation kept you from medical appointments or  from getting medications?: Yes    Lack of Transportation (Non-Medical): No  Physical Activity: Insufficiently Active (08/25/2023)   Received from [facility]   Exercise Vital Sign    Days of Exercise per Week: 1 day    Minutes of Exercise per Session: 60 min  Stress: No Stress Concern Present (08/25/2023)   Received from [facility] - Occupational Stress Questionnaire    Feeling of Stress : Not at all  Social Connections: Socially Isolated (08/25/2023)   Received from [facility]   Social Connection and Isolation Panel  [NHANES]    Frequency of Communication with Friends and Family: Twice a week    Frequency of Social Gatherings with Friends and Family: Never    Attends Religious Services: Never    Database administrator or Organizations: No    Attends Banker Meetings: Never    Marital Status: Separated  Intimate Partner Violence: Not At Risk (09/12/2023)   Humiliation, Afraid, Rape, and Kick questionnaire    Fear of Current or Ex-Partner: No    Emotionally Abused: No    Physically Abused: No    Sexually Abused: No    History reviewed. No pertinent family history. No Active Allergies I? Current Facility-Administered Medications  Medication Dose Route Frequency Provider Last Rate Last Admin   acetaminophen (TYLENOL) suppository 650 mg  650 mg Rectal Q4H PRN Rust-Chester, Cecelia Byars, NP   650 mg at 09/29/23 2153   acetaminophen (TYLENOL) tablet 650 mg  650 mg Oral Q6H PRN Otelia Sergeant, RPH   650 mg at 10/13/23 2125   apixaban (ELIQUIS) tablet 5 mg  5 mg Oral BID Otelia Sergeant, RPH   5 mg at 10/14/23 5621   ascorbic acid (VITAMIN C) tablet 500 mg  500 mg Oral BID Otelia Sergeant, RPH   500 mg at 10/14/23 0845   feeding supplement (ENSURE ENLIVE / ENSURE PLUS) liquid 237 mL  237 mL Oral TID BM Vida Rigger, MD   237 mL at 10/14/23 1316   feeding supplement (OSMOLITE 1.5 CAL) liquid 720 mL  720 mL Per Tube Continuous Arnetha Courser, MD 60 mL/hr at 10/13/23 2102 720 mL at 10/13/23 2102   feeding supplement (PROSource TF20) liquid 60 mL  60 mL Per Tube Daily Raechel Chute, MD   60 mL at 10/14/23 0845   free water 30 mL  30 mL Per Tube Q4H Dgayli, Lianne Bushy, MD   30 mL at 10/14/23 1338   hydrocortisone (ANUSOL-HC) suppository 25 mg  25 mg Rectal BID Arnetha Courser, MD   25 mg at 10/13/23 2101   insulin aspart (novoLOG) injection 0-9 Units  0-9 Units Subcutaneous Q4H Lowella Bandy, RPH   3 Units at 10/14/23 1316   ipratropium-albuterol (DUONEB) 0.5-2.5 (3) MG/3ML nebulizer solution 3 mL  3 mL  Nebulization Q6H PRN Ezequiel Essex, NP       ipratropium-albuterol (DUONEB) 0.5-2.5 (3) MG/3ML nebulizer solution 3 mL  3 mL Nebulization TID Arnetha Courser, MD   3 mL at 10/14/23 1416   leptospermum manuka honey (MEDIHONEY) paste 1 Application  1 Application Topical Daily Arnetha Courser, MD   1 Application at 10/14/23 1021   loperamide (IMODIUM) capsule 2 mg  2 mg Oral PRN Arnetha Courser, MD   2 mg at 10/10/23 0315   LORazepam (ATIVAN) injection 0.5 mg  0.5 mg Intravenous Q6H PRN Arnetha Courser, MD   0.5 mg  at 10/13/23 1612   metoprolol tartrate (LOPRESSOR) tablet 12.5 mg  12.5 mg Oral BID Arnetha Courser, MD   12.5 mg at 10/14/23 1556   multivitamin with minerals tablet 1 tablet  1 tablet Oral Daily Otelia Sergeant, RPH   1 tablet at 10/14/23 2725   Muscle Rub CREA 1 Application  1 Application Topical PRN Arnetha Courser, MD   1 Application at 10/13/23 3664   nicotine (NICODERM CQ - dosed in mg/24 hours) patch 14 mg  14 mg Transdermal Daily Arnetha Courser, MD   14 mg at 10/13/23 1011   nutrition supplement (JUVEN) (JUVEN) powder packet 1 packet  1 packet Per Tube BID BM Raechel Chute, MD   1 packet at 10/14/23 1316   ondansetron (ZOFRAN) injection 4 mg  4 mg Intravenous Q8H PRN Lorretta Harp, MD       Oral care mouth rinse  15 mL Mouth Rinse 4 times per day Arnetha Courser, MD   15 mL at 10/14/23 0847   Oral care mouth rinse  15 mL Mouth Rinse PRN Arnetha Courser, MD       oxyCODONE (Oxy IR/ROXICODONE) immediate release tablet 5 mg  5 mg Oral Q4H PRN Arnetha Courser, MD   5 mg at 10/14/23 1315   pantoprazole (PROTONIX) injection 40 mg  40 mg Intravenous QHS Lowella Bandy, RPH   40 mg at 10/13/23 2101   piperacillin-tazobactam (ZOSYN) IVPB 3.375 g  3.375 g Intravenous Q8H Ronnald Ramp, RPH 12.5 mL/hr at 10/14/23 1556 3.375 g at 10/14/23 1556   polyvinyl alcohol (LIQUIFILM TEARS) 1.4 % ophthalmic solution 1 drop  1 drop Both Eyes PRN Erin Fulling, MD   1 drop at 09/29/23 1614   predniSONE (DELTASONE) tablet 10  mg  10 mg Oral Q breakfast Arnetha Courser, MD   10 mg at 10/14/23 0845   sodium chloride flush (NS) 0.9 % injection 10-40 mL  10-40 mL Intracatheter Q12H Vida Rigger, MD   10 mL at 10/14/23 0847   sodium chloride flush (NS) 0.9 % injection 10-40 mL  10-40 mL Intracatheter PRN Vida Rigger, MD   10 mL at 09/13/23 2151     Abtx:  Anti-infectives (From admission, onward)    Start     Dose/Rate Route Frequency Ordered Stop   10/14/23 1600  piperacillin-tazobactam (ZOSYN) IVPB 3.375 g        3.375 g 12.5 mL/hr over 240 Minutes Intravenous Every 8 hours 10/14/23 1433     10/06/23 0500  vancomycin (VANCOREADY) IVPB 750 mg/150 mL  Status:  Discontinued       Placed in "Followed by" Linked Group   750 mg 150 mL/hr over 60 Minutes Intravenous Every 12 hours 10/05/23 1412 10/05/23 1737   10/06/23 0500  vancomycin (VANCOCIN) 750 mg in sodium chloride 0.9 % 250 mL IVPB  Status:  Discontinued        750 mg 250 mL/hr over 60 Minutes Intravenous Every 12 hours 10/05/23 1737 10/06/23 1338   10/05/23 1700  vancomycin (VANCOREADY) IVPB 1250 mg/250 mL       Placed in "Followed by" Linked Group   1,250 mg 166.7 mL/hr over 90 Minutes Intravenous  Once 10/05/23 1412 10/05/23 1808   10/05/23 1600  piperacillin-tazobactam (ZOSYN) IVPB 3.375 g        3.375 g 12.5 mL/hr over 240 Minutes Intravenous Every 8 hours 10/05/23 1410 10/09/23 2144   10/04/23 2200  azithromycin (ZITHROMAX) tablet 250 mg  Status:  Discontinued  250 mg Oral Daily at bedtime 10/04/23 0907 10/04/23 1150   10/04/23 1200  doxycycline (VIBRAMYCIN) 100 mg in sodium chloride 0.9 % 250 mL IVPB  Status:  Discontinued        100 mg 125 mL/hr over 120 Minutes Intravenous 2 times daily 10/04/23 1147 10/05/23 1410   09/29/23 2000  azithromycin (ZITHROMAX) 250 mg in dextrose 5 % 125 mL IVPB  Status:  Discontinued        250 mg 127.5 mL/hr over 60 Minutes Intravenous Every 24 hours 09/29/23 1801 10/04/23 0907   09/29/23 1245  azithromycin  (ZITHROMAX) tablet 250 mg  Status:  Discontinued        250 mg Per Tube Daily 09/29/23 1150 09/29/23 1801   09/25/23 1200  linezolid (ZYVOX) IVPB 600 mg  Status:  Discontinued        600 mg 300 mL/hr over 60 Minutes Intravenous Every 12 hours 09/25/23 0918 09/26/23 1206   09/25/23 1015  ceFEPIme (MAXIPIME) 2 g in sodium chloride 0.9 % 100 mL IVPB  Status:  Discontinued        2 g 200 mL/hr over 30 Minutes Intravenous Every 8 hours 09/25/23 0918 09/29/23 1053   09/25/23 0000  ceFAZolin (ANCEF) IVPB 2g/100 mL premix        2 g 200 mL/hr over 30 Minutes Intravenous  Once 09/23/23 1103 09/25/23 0013   09/22/23 0010  vancomycin (VANCOCIN) IVPB 750 mg/150 ml premix  Status:  Discontinued        750 mg 150 mL/hr over 60 Minutes Intravenous Every 12 hours 09/22/23 0004 09/22/23 1039   09/22/23 0000  Vancomycin (VANCOCIN) 750 mg IVPB  Status:  Discontinued        750 mg 150 mL/hr over 60 Minutes Intravenous Every 12 hours 09/21/23 1120 09/21/23 2356   09/22/23 0000  vancomycin (VANCOREADY) IVPB 750 mg/150 mL  Status:  Discontinued        750 mg 150 mL/hr over 60 Minutes Intravenous Every 12 hours 09/21/23 2356 09/22/23 0004   09/21/23 1300  vancomycin (VANCOCIN) IVPB 1000 mg/200 mL premix        1,000 mg 200 mL/hr over 60 Minutes Intravenous  Once 09/21/23 1120 09/21/23 1900   09/21/23 1215  piperacillin-tazobactam (ZOSYN) IVPB 3.375 g  Status:  Discontinued        3.375 g 12.5 mL/hr over 240 Minutes Intravenous Every 8 hours 09/21/23 1120 09/23/23 1257   09/21/23 1130  vancomycin (VANCOREADY) IVPB 1250 mg/250 mL  Status:  Discontinued        1,250 mg 166.7 mL/hr over 90 Minutes Intravenous  Once 09/21/23 1046 09/21/23 1120   09/21/23 1100  piperacillin-tazobactam (ZOSYN) IVPB 3.375 g  Status:  Discontinued        3.375 g 12.5 mL/hr over 240 Minutes Intravenous  Once 09/21/23 1046 09/21/23 1152   09/13/23 1445  piperacillin-tazobactam (ZOSYN) IVPB 3.375 g        3.375 g 12.5 mL/hr over 240  Minutes Intravenous Every 8 hours 09/13/23 1351 09/17/23 0157   09/10/23 2315  cefTRIAXone (ROCEPHIN) 1 g in sodium chloride 0.9 % 100 mL IVPB  Status:  Discontinued        1 g 200 mL/hr over 30 Minutes Intravenous Every 24 hours 09/10/23 2305 09/13/23 1336       REVIEW OF SYSTEMS:  Const: negative fever, negative chills, negative weight loss Eyes: negative diplopia or visual changes, negative eye pain ENT: negative coryza, negative sore throat Resp: negative cough,  hemoptysis, dyspnea Cards: negative for chest pain, palpitations, lower extremity edema GU: negative for frequency, dysuria and hematuria GI: Negative for abdominal pain, diarrhea, bleeding, constipation Skin: negative for rash and pruritus Heme: negative for easy bruising and gum/nose bleeding MS: negative for myalgias, arthralgias, back pain and muscle weakness Neurolo:negative for headaches, dizziness, vertigo, memory problems  Psych: negative for feelings of anxiety, depression  Endocrine: negative for thyroid, diabetes Allergy/Immunology- negative for any medication or food allergies ? Pertinent Positives include : Objective:  VITALS:  BP 103/71 (BP Location: Right Arm)   Pulse (!) 120   Temp 98.8 F (37.1 C) (Axillary)   Resp (!) 25   Ht 6' (1.829 m)   Wt 50.1 kg   SpO2 100%   BMI 14.98 kg/m  LDA Foley Central line Other drainage tubes PHYSICAL EXAM:  General: Alert, cooperative, no distress, appears stated age.  Head: Normocephalic, without obvious abnormality, atraumatic. Eyes: Conjunctivae clear, anicteric sclerae. Pupils are equal ENT Nares normal. No drainage or sinus tenderness. Lips, mucosa, and tongue normal. No Thrush Neck: Supple, symmetrical, no adenopathy, thyroid: non tender no carotid bruit and no JVD. Back: No CVA tenderness. Lungs: Clear to auscultation bilaterally. No Wheezing or Rhonchi. No rales. Heart: Regular rate and rhythm, no murmur, rub or gallop. Abdomen: Soft,  non-tender,not distended. Bowel sounds normal. No masses Extremities: atraumatic, no cyanosis. No edema. No clubbing Skin: No rashes or lesions. Or bruising Lymph: Cervical, supraclavicular normal. Neurologic: Grossly non-focal Pertinent Labs Lab Results CBC    Component Value Date/Time   WBC 12.2 (H) 10/14/2023 0241   RBC 3.31 (L) 10/14/2023 0241   HGB 9.8 (L) 10/14/2023 0241   HCT 30.9 (L) 10/14/2023 0241   PLT 368 10/14/2023 0241   MCV 93.4 10/14/2023 0241   MCH 29.6 10/14/2023 0241   MCHC 31.7 10/14/2023 0241   RDW 15.0 10/14/2023 0241   LYMPHSABS 0.7 09/26/2023 0520   MONOABS 1.1 (H) 09/26/2023 0520   EOSABS 0.0 09/26/2023 0520   BASOSABS 0.0 09/26/2023 0520       Latest Ref Rng & Units 10/14/2023    2:41 AM 10/13/2023    8:14 AM 10/12/2023    8:32 AM  CMP  Glucose 70 - 99 mg/dL 161  096  045   BUN 8 - 23 mg/dL 24  19  18    Creatinine 0.61 - 1.24 mg/dL 4.09  8.11  9.14   Sodium 135 - 145 mmol/L 134  134  132   Potassium 3.5 - 5.1 mmol/L 3.9  4.2  4.2   Chloride 98 - 111 mmol/L 93  94  93   CO2 22 - 32 mmol/L 30  31  27    Calcium 8.9 - 10.3 mg/dL 8.9  8.9  8.9       Microbiology: Recent Results (from the past 240 hours)  MRSA Next Gen by PCR, Nasal     Status: None   Collection Time: 10/06/23 10:28 AM   Specimen: Nasal Mucosa; Nasal Swab  Result Value Ref Range Status   MRSA by PCR Next Gen NOT DETECTED NOT DETECTED Final    Comment: (NOTE) The GeneXpert MRSA Assay (FDA approved for NASAL specimens only), is one component of a comprehensive MRSA colonization surveillance program. It is not intended to diagnose MRSA infection nor to guide or monitor treatment for MRSA infections. Test performance is not FDA approved in patients less than 58 years old. Performed at [facility], [address], [place], [place] [address]  IMAGING RESULTS:  I have personally reviewed the films ? Impression/Recommendation ? ? ? ___I have personally spent   ---minutes involved in face-to-face and non-face-to-face activities for this patient on the day of the visit. Professional time spent includes the following activities: Preparing to see the patient (review of tests), Obtaining and/or reviewing separately obtained history (admission/discharge record), Performing a medically appropriate examination and/or evaluation , Ordering medications/tests/procedures, referring and communicating with other health care professionals, Documenting clinical information in the EMR, Independently interpreting results (not separately reported), Communicating results to the patient/family/caregiver, Counseling and educating the patient/family/caregiver and Care coordination (not separately reported).    ________________________________________________ Discussed with patient, requesting provider Note:  This document was prepared using Dragon voice recognition software and may include unintentional dictation errors.

## 2023-10-14 NOTE — TOC Progression Note (Addendum)
Transition of Care [facility]) - Progression Note    Patient Details  Name: Benjamin Bolton MRN: [id number] Date of Birth: 1960/09/20  Transition of Care [facility] CM/SW Contact  Margarito Liner, LCSW Phone Number: 10/14/2023, 12:05 PM  Clinical Narrative: CSW sent signed appeal letter to [facility] liaison.    12:25 pm: CSW called and updated sister.  Expected Discharge Plan and Services                                               Social Determinants of Health (SDOH) Interventions SDOH Screenings   Food Insecurity: No Food Insecurity (09/12/2023)  Recent Concern: Food Insecurity - Food Insecurity Present (08/25/2023)   Received from [facility]  Housing: Unknown (09/12/2023)  Transportation Needs: No Transportation Needs (09/12/2023)  Recent Concern: Transportation Needs - Unmet Transportation Needs (08/25/2023)   Received from [facility]  Utilities: Not At Risk (09/12/2023)  Recent Concern: Utilities - At Risk (07/23/2023)   Received from [facility]  Financial Resource Strain: Low Risk  (08/25/2023)   Received from [facility]  Recent Concern: Financial Resource Strain - Medium Risk (07/23/2023)   Received from [facility]  Physical Activity: Insufficiently Active (08/25/2023)   Received from [facility]  Social Connections: Socially Isolated (08/25/2023)   Received from [facility]  Stress: No Stress Concern Present (08/25/2023)   Received from [facility]  Tobacco Use: High Risk (09/25/2023)  Health Literacy: Inadequate Health Literacy (08/25/2023)   Received from [facility]    Readmission Risk Interventions     No data to display

## 2023-10-14 NOTE — Assessment & Plan Note (Signed)
-  Patient has history of DVT   -Continue Eliquis

## 2023-10-14 NOTE — Consult Note (Addendum)
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  Present on admission: No Dressing procedure/placement/frequency: Continue present plan of care as previously ordered to assist with removal of nonviable tissue. Topical treatment orders have been provided for bedside nurses to perform as follows : Cleanse sacral wound with NS, apply Medihoney to wound bed daily, cover with dry gauze and silicone foam. May lift foam daily to replace Medihoney. Change foam q3 days and prn soiling. WOC team will re-assess weekly to determine if a change in the plan of care is indicated at that time.  Thank-you,  Cammie Mcgee MSN, RN, CWOCN, [place], CNS [phone]

## 2023-10-14 NOTE — Progress Notes (Signed)
Calorie Count Note  48 hour calorie count ordered.  Diet: dysphagia 2 with thin liquids Supplements: Ensure Enlive po TID, each supplement provides 350 kcal and 20 grams of protein; Magic cup TID with meals, each supplement provides 290 kcal and 9 grams of protein   Pt lying in bed at time of visit, however, reluctant to engage with RD. When RD spoke to pt, he shook his head and stated he was not feeling well.   Case discussed with PRN and nurse tech. Pt just received pain medications. Per RN, they will try to fed pt later. RN confirms that pt refused all oral intake last week.   2/10 Breakfast: 0% completed Lunch: 0% completed Dinner: 0% completed  Total intake: 0 kcal (0% of minimum estimated needs)  0 grams protein (0% of minimum estimated needs)  Nutrition Dx: Severe Malnutrition related to acute illness as evidenced by moderate fat depletion, severe fat depletion, moderate muscle depletion, severe muscle depletion; ongoing  Goal: Patient will meet greater than or equal to 90% of their needs; progressing   Intervention:   -Continue 48 hour calorie count -TF via NGT (transition to nocturnal feeds):    Continue Osmolite 1.5 @ 60 ml/hr x 12 hours   60 ml Prosource TF daily   30 ml free water flush every 4 hours   Tube feeding regimen provides 1160 kcal (100% of needs), 65 grams of protein, and 548 ml of H2O.  Total free water: 723 ml daily   -Continue Vitamin C 500mg  BID via tube -Continue Thiamine 100mg  dialy via tube x 7 days  -Continue Juven Fruit Punch BID via tube, each serving provides 95kcal and 2.5g of protein (amino acids glutamine and arginine) -Pt at high refeed risk; recommend monitor potassium, magnesium and phosphorus labs daily until stable -Continue daily weights  -Pharmacy consult for electrolyte management  -If pt desires aggressive care and this aligns with goals of care, may need to consider permanent feeding acces (ex PEG) pending GOC discussions     Levada Schilling, RD, LDN, CDCES Registered Dietitian III Certified Diabetes Care and Education Specialist If unable to reach this RD, please use "RD Inpatient" group chat on secure chat between hours of 8am-4 pm daily

## 2023-10-14 NOTE — Progress Notes (Signed)
Physical Therapy Treatment Patient Details Name: Benjamin Bolton MRN: [id number] DOB: September 24, 1960 Today's Date: 10/14/2023   History of Present Illness 63 y.o. 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    PT Comments  Pt received in Semi-Fowler's position and agreeable to therapy.  Pt ultimately declined mobility at this point due to fatigue, however was willing to perform bed-level exercises.  Pt performed these well without any complications.  Pt stated he would be up for mobilization tomorrow when +2 assistance could help with mobilizing.  Pt left with all needs met and call bell within reach.      If plan is discharge home, recommend the following: A lot of help with walking and/or transfers;A lot of help with bathing/dressing/bathroom;Assist for transportation;Assistance with cooking/housework;Help with stairs or ramp for entrance   Can travel by private vehicle        Equipment Recommendations       Recommendations for Other Services Rehab consult     Precautions / Restrictions Precautions Precautions: Fall Restrictions Weight Bearing Restrictions Per Provider Order: No     Mobility  Bed Mobility               General bed mobility comments: deferrred at this time.    Transfers                        Ambulation/Gait                   Stairs             Wheelchair Mobility     Tilt Bed    Modified Rankin (Stroke Patients Only)       Balance Overall balance assessment: Needs assistance Sitting-balance support: Feet supported Sitting balance-Leahy Scale: Fair Sitting balance - Comments: sitting balance improved this session with no external support required-CGA d/t NG tube coming out                                     Communication Communication Communication:  (low volume)  Cognition Arousal: Alert Behavior During Therapy: WFL for tasks assessed/performed                                    Cueing Cueing Techniques: Verbal cues, Tactile cues  Exercises Total Joint Exercises Ankle Circles/Pumps: AROM, Strengthening, Both, 10 reps, Supine Quad Sets: AROM, Strengthening, Both, 10 reps, Supine Gluteal Sets: AROM, Strengthening, Both, 10 reps, Supine Heel Slides: AROM, Strengthening, Both, 10 reps, Supine Hip ABduction/ADduction: AROM, Strengthening, Both, 10 reps, Supine Straight Leg Raises: AROM, Strengthening, Both, 10 reps, Supine    General Comments        Pertinent Vitals/Pain Pain Assessment Pain Assessment: Faces Faces Pain Scale: Hurts little more Pain Location: R shoulder and R flank region Pain Descriptors / Indicators: Discomfort Pain Intervention(s): Monitored during session, Premedicated before session    Home Living                          Prior Function            PT Goals (current  goals can now be found in the care plan section) Acute Rehab PT Goals Patient Stated Goal: to get out of here PT Goal Formulation: With patient Time For Goal Achievement: 10/14/23 Potential to Achieve Goals: Fair Progress towards PT goals: Progressing toward goals    Frequency    Min 1X/week      PT Plan      Co-evaluation PT/OT/SLP Co-Evaluation/Treatment: Yes Reason for Co-Treatment: For patient/therapist safety;To address functional/ADL transfers PT goals addressed during session: Mobility/safety with mobility OT goals addressed during session: ADL's and self-care      AM-PAC PT "6 Clicks" Mobility   Outcome Measure  Help needed turning from your back to your side while in a flat bed without using bedrails?: A Little Help needed moving from lying on your back to sitting on the side of a flat bed without using  bedrails?: A Lot Help needed moving to and from a bed to a chair (including a wheelchair)?: A Lot Help needed standing up from a chair using your arms (e.g., wheelchair or bedside chair)?: Total Help needed to walk in hospital room?: Total Help needed climbing 3-5 steps with a railing? : Total 6 Click Score: 10    End of Session   Activity Tolerance: Patient limited by fatigue Patient left: in bed;with call bell/phone within reach;with nursing/sitter in room Nurse Communication: Mobility status PT Visit Diagnosis: Unsteadiness on feet (R26.81);Muscle weakness (generalized) (M62.81);Difficulty in walking, not elsewhere classified (R26.2)     Time: 4098-1191 PT Time Calculation (min) (ACUTE ONLY): 12 min  Charges:    $Therapeutic Exercise: 8-22 mins PT General Charges $$ ACUTE PT VISIT: 1 Visit                     Nolon Bussing, PT, DPT Physical Therapist - [facility]  10/14/23, 5:14 PM

## 2023-10-15 DIAGNOSIS — I824Y9 Acute embolism and thrombosis of unspecified deep veins of unspecified proximal lower extremity: Secondary | ICD-10-CM | POA: Diagnosis not present

## 2023-10-15 DIAGNOSIS — E43 Unspecified severe protein-calorie malnutrition: Secondary | ICD-10-CM | POA: Diagnosis not present

## 2023-10-15 DIAGNOSIS — R5081 Fever presenting with conditions classified elsewhere: Secondary | ICD-10-CM

## 2023-10-15 DIAGNOSIS — J9621 Acute and chronic respiratory failure with hypoxia: Secondary | ICD-10-CM | POA: Diagnosis not present

## 2023-10-15 DIAGNOSIS — J441 Chronic obstructive pulmonary disease with (acute) exacerbation: Secondary | ICD-10-CM | POA: Diagnosis not present

## 2023-10-15 LAB — GLUCOSE, CAPILLARY
Glucose-Capillary: 221 mg/dL — ABNORMAL HIGH (ref 70–99)
Glucose-Capillary: 143 mg/dL — ABNORMAL HIGH (ref 70–99)
Glucose-Capillary: 164 mg/dL — ABNORMAL HIGH (ref 70–99)
Glucose-Capillary: 153 mg/dL — ABNORMAL HIGH (ref 70–99)
Glucose-Capillary: 134 mg/dL — ABNORMAL HIGH (ref 70–99)

## 2023-10-15 LAB — RESPIRATORY PANEL BY PCR

## 2023-10-15 LAB — CBC
HCT: 27.6 % — ABNORMAL LOW (ref 39.0–52.0)
Hemoglobin: 9 g/dL — ABNORMAL LOW (ref 13.0–17.0)
MCH: 29.7 pg (ref 26.0–34.0)
MCHC: 32.6 g/dL (ref 30.0–36.0)
MCV: 91.1 fL (ref 80.0–100.0)
Platelets: 352 10*3/uL (ref 150–400)
RBC: 3.03 MIL/uL — ABNORMAL LOW (ref 4.22–5.81)
RDW: 15.3 % (ref 11.5–15.5)
WBC: 14.4 10*3/uL — ABNORMAL HIGH (ref 4.0–10.5)
nRBC: 0 % (ref 0.0–0.2)

## 2023-10-15 LAB — PROCALCITONIN: Procalcitonin: 0.22 ng/mL

## 2023-10-15 LAB — RESP PANEL BY RT-PCR (RSV, FLU A&B, COVID)  RVPGX2
Influenza A by PCR: NEGATIVE
Influenza B by PCR: NEGATIVE
Resp Syncytial Virus by PCR: POSITIVE — AB
SARS Coronavirus 2 by RT PCR: NEGATIVE

## 2023-10-15 LAB — LACTATE DEHYDROGENASE: LDH: 154 U/L (ref 98–192)

## 2023-10-15 LAB — C-REACTIVE PROTEIN: CRP: 11.6 mg/dL — ABNORMAL HIGH (ref <1.0)

## 2023-10-15 LAB — SARS CORONAVIRUS 2 BY RT PCR: SARS Coronavirus 2 by RT PCR: NEGATIVE

## 2023-10-15 LAB — SEDIMENTATION RATE: Sed Rate: 106 mm/h — ABNORMAL HIGH (ref 0–20)

## 2023-10-15 MED ORDER — IPRATROPIUM-ALBUTEROL 20-100 MCG/ACT IN AERS
1.0000 | INHALATION_SPRAY | Freq: Three times a day (TID) | RESPIRATORY_TRACT | Status: DC
  Administered 2023-10-15 – 2023-10-19 (×13): 1 via RESPIRATORY_TRACT
  Filled 2023-10-15: qty 4

## 2023-10-15 MED ORDER — POLYETHYLENE GLYCOL 3350 17 G PO PACK
17.0000 g | PACK | Freq: Every day | ORAL | Status: DC
  Administered 2023-10-15 – 2023-10-29 (×11): 17 g via ORAL
  Filled 2023-10-15 (×13): qty 1

## 2023-10-15 MED ORDER — HYDROCORTISONE ACETATE 25 MG RE SUPP: 25.0000 mg | Freq: Two times a day (BID) | RECTAL | Status: DC | PRN

## 2023-10-15 MED ORDER — PREDNISONE 20 MG PO TABS
20.0000 mg | ORAL_TABLET | Freq: Every day | ORAL | Status: DC
  Administered 2023-10-16 – 2023-10-26 (×10): 20 mg via ORAL
  Filled 2023-10-15 (×10): qty 1

## 2023-10-15 MED ORDER — IPRATROPIUM-ALBUTEROL 20-100 MCG/ACT IN AERS
1.0000 | INHALATION_SPRAY | Freq: Four times a day (QID) | RESPIRATORY_TRACT | Status: DC | PRN
  Filled 2023-10-15: qty 4

## 2023-10-15 MED ORDER — MENTHOL 3 MG MT LOZG
1.0000 | LOZENGE | OROMUCOSAL | Status: DC | PRN
  Administered 2023-10-15: 3 mg via ORAL
  Filled 2023-10-15: qty 9

## 2023-10-15 MED ORDER — FLUCONAZOLE 100 MG PO TABS
200.0000 mg | ORAL_TABLET | Freq: Every day | ORAL | Status: AC
  Administered 2023-10-15 – 2023-10-28 (×14): 200 mg via ORAL
  Filled 2023-10-15 (×14): qty 2

## 2023-10-15 MED ORDER — PROSOURCE TF20 ENFIT COMPATIBL EN LIQD
60.0000 mL | Freq: Two times a day (BID) | ENTERAL | Status: DC
  Administered 2023-10-15: 60 mL
  Filled 2023-10-15 (×4): qty 60

## 2023-10-15 MED ORDER — OSMOLITE 1.5 CAL PO LIQD
1120.0000 mL | ORAL | Status: DC
  Administered 2023-10-15: 1120 mL

## 2023-10-15 NOTE — Plan of Care (Signed)
  Problem: Education: Goal: Knowledge of General Education information will improve Description: Including pain rating scale, medication(s)/side effects and non-pharmacologic comfort measures Outcome: Progressing   Problem: Health Behavior/Discharge Planning: Goal: Ability to manage health-related needs will improve Outcome: Progressing   Problem: Clinical Measurements: Goal: Ability to maintain clinical measurements within normal limits will improve Outcome: Progressing Goal: Will remain free from infection Outcome: Progressing Goal: Diagnostic test results will improve Outcome: Progressing Goal: Respiratory complications will improve Outcome: Progressing Goal: Cardiovascular complication will be avoided Outcome: Progressing   Problem: Activity: Goal: Risk for activity intolerance will decrease Outcome: Progressing   Problem: Nutrition: Goal: Adequate nutrition will be maintained Outcome: Progressing   Problem: Coping: Goal: Level of anxiety will decrease Outcome: Progressing   Problem: Elimination: Goal: Will not experience complications related to bowel motility Outcome: Progressing Goal: Will not experience complications related to urinary retention Outcome: Progressing   Problem: Pain Management: Goal: General experience of comfort will improve Outcome: Progressing   Problem: Safety: Goal: Ability to remain free from injury will improve Outcome: Progressing   Problem: Skin Integrity: Goal: Risk for impaired skin integrity will decrease Outcome: Progressing   Problem: Education: Goal: Knowledge of disease or condition will improve Outcome: Progressing Goal: Knowledge of the prescribed therapeutic regimen will improve Outcome: Progressing   Problem: Activity: Goal: Ability to tolerate increased activity will improve Outcome: Progressing Goal: Will verbalize the importance of balancing activity with adequate rest periods Outcome: Progressing   Problem:  Respiratory: Goal: Ability to maintain a clear airway will improve Outcome: Progressing Goal: Levels of oxygenation will improve Outcome: Progressing Goal: Ability to maintain adequate ventilation will improve Outcome: Progressing   Problem: Education: Goal: Ability to describe self-care measures that may prevent or decrease complications (Diabetes Survival Skills Education) will improve Outcome: Progressing   Problem: Coping: Goal: Ability to adjust to condition or change in health will improve Outcome: Progressing   Problem: Fluid Volume: Goal: Ability to maintain a balanced intake and output will improve Outcome: Progressing   Problem: Health Behavior/Discharge Planning: Goal: Ability to identify and utilize available resources and services will improve Outcome: Progressing Goal: Ability to manage health-related needs will improve Outcome: Progressing   Problem: Metabolic: Goal: Ability to maintain appropriate glucose levels will improve Outcome: Progressing   Problem: Nutritional: Goal: Maintenance of adequate nutrition will improve Outcome: Progressing Goal: Progress toward achieving an optimal weight will improve Outcome: Progressing   Problem: Skin Integrity: Goal: Risk for impaired skin integrity will decrease Outcome: Progressing   Problem: Tissue Perfusion: Goal: Adequacy of tissue perfusion will improve Outcome: Progressing   Problem: Activity: Goal: Ability to tolerate increased activity will improve Outcome: Progressing   Problem: Respiratory: Goal: Ability to maintain a clear airway and adequate ventilation will improve Outcome: Progressing   Problem: Role Relationship: Goal: Method of communication will improve Outcome: Progressing    Pt was febrile and tachycardia , treated accordingly. MD notified.

## 2023-10-15 NOTE — Progress Notes (Signed)
Date of Admission:  09/10/2023      ID: Benjamin Bolton is a 63 y.o. male  Principal Problem:   Acute on chronic respiratory failure with hypoxia (HCC) Active Problems:   COPD exacerbation (HCC)   DVT (deep venous thrombosis) (HCC)   Protein-calorie malnutrition, severe (HCC)   Tobacco abuse   Electrolyte abnormality   Normochromic anemia   Pre-diabetes   Elevated troponin   Diarrhea   Fever   Benjamin Bolton is a 63 y.o. male with a history of COPD on home oxygen, hyperlipidemia, prediabetes, DVT on Eliquis presented to [facility] ED on 09/10/2023.  Patient has been in the hospital since then. He was diagnosed with COPD exacerbation, RSV viral infection and pneumonia.  Started on high-dose prednisone, antibiotics.  He was intubated between 09/12/2023 until 09/28/2023. Antibiotic regimen has been ceftriaxone 09/10/2023 until 09/12/2023 Zosyn 09/13/2023 until 09/16/2023 and then restarted on 09/21/2023 until 09/23/2023 Vancomycin 09/21/2023 until 09/22/2023 Cefepime 09/25/2023 until 09/29/2023 Linezolid 09/25/2023 until 09/26/2023 Azithromycin 09/29/2023 until 10/01/2023 Doxycycline 2/1-10/05/23  Zosyn to 2/2 until 10/09/2023 Vancomycin to 10/05/23 until 10/06/2023 Zosyn 10/14/23-2/12  Subjective: Pt says he needs to be situated OT at bed side but he says he is weak and tired  Medications:   apixaban  5 mg Oral BID   vitamin C  500 mg Oral BID   feeding supplement  237 mL Oral TID BM   feeding supplement (PROSource TF20)  60 mL Per Tube Daily   free water  30 mL Per Tube Q4H   insulin aspart  0-9 Units Subcutaneous Q4H   Ipratropium-Albuterol  1 puff Inhalation Q8H   leptospermum manuka honey  1 Application Topical Daily   metoprolol tartrate  12.5 mg Oral BID   multivitamin with minerals  1 tablet Oral Daily   nicotine  14 mg Transdermal Daily   nutrition supplement (JUVEN)  1 packet Per Tube BID BM   mouth rinse  15 mL Mouth Rinse 4 times per day   pantoprazole (PROTONIX) IV  40 mg Intravenous QHS    predniSONE  10 mg Oral Q breakfast   sodium chloride flush  10-40 mL Intracatheter Q12H    Objective: Vital signs in last 24 hours: Patient Vitals for the past 24 hrs:  BP Temp Temp src Pulse Resp SpO2 Weight  10/15/23 1245 -- (!) 100.8 F (38.2 C) Axillary -- -- -- --  10/15/23 1200 96/68 -- -- (!) 103 (!) 22 100 % --  10/15/23 1134 -- (!) 101.2 F (38.4 C) Axillary -- 17 -- --  10/15/23 1105 -- -- -- -- 17 -- --  10/15/23 1100 95/72 -- -- -- (!) 27 -- --  10/15/23 1005 -- -- -- -- 20 -- --  10/15/23 1000 103/73 -- -- -- (!) 28 -- --  10/15/23 0800 95/67 -- -- (!) 116 (!) 23 97 % --  10/15/23 0749 -- 99.8 F (37.7 C) Oral -- 16 -- --  10/15/23 0700 (!) 89/61 -- -- (!) 117 18 99 % --  10/15/23 0504 -- -- -- -- -- -- 48.5 kg  10/15/23 0500 98/63 -- -- (!) 117 (!) 23 97 % --  10/15/23 0400 93/66 100.2 F (37.9 C) Axillary (!) 108 (!) 22 97 % --  10/15/23 0300 99/64 -- -- (!) 112 (!) 25 95 % --  10/15/23 0200 93/68 -- -- (!) 110 (!) 22 96 % --  10/15/23 0100 95/66 99.1 F (37.3 C) Oral (!) 105 (!) 23 97 % --  10/15/23 0000 (!) 87/66 -- -- (!) 103 (!) 24 99 % --  10/14/23 2200 98/61 -- -- -- (!) 23 -- --  10/14/23 2100 102/64 -- -- (!) 115 (!) 28 98 % --  10/14/23 2024 -- -- -- (!) 118 (!) 23 98 % --  10/14/23 2021 -- -- -- -- -- 96 % --  10/14/23 2010 99/65 100.2 F (37.9 C) Axillary (!) 115 (!) 23 96 % --  10/14/23 1418 -- -- -- -- -- 100 % --      PHYSICAL EXAM:  General: Awake, ,chronically ill, emaciated  Head: Normocephalic, without obvious abnormality, atraumatic. Oral cavity thrush Voice hoarse Neck: Supple, symmetrical, no adenopathy, thyroid: non tender no carotid bruit and no JVD. Back: tender sacrum Lungs:b/l air entry- crepts bases. Heart: Tachycardia Abdomen: Soft, non-tender,not distended. Bowel sounds normal. No masses Extremities: atraumatic, no cyanosis. No edema. No clubbing Skin: No rashes or lesions. Or bruising Lymph: Cervical, supraclavicular  normal. Neurologic: Grossly non-focal  Lab Results    Latest Ref Rng & Units 10/15/2023    4:26 AM 10/14/2023    2:41 AM 10/13/2023    8:14 AM  CBC  WBC 4.0 - 10.5 K/uL 14.4  12.2  11.7   Hemoglobin 13.0 - 17.0 g/dL 9.0  9.8  9.1   Hematocrit 39.0 - 52.0 % 27.6  30.9  28.0   Platelets 150 - 400 K/uL 352  368  367        Latest Ref Rng & Units 10/14/2023    6:29 PM 10/14/2023    2:41 AM 10/13/2023    8:14 AM  CMP  Glucose 70 - 99 mg/dL  161  096   BUN 8 - 23 mg/dL  24  19   Creatinine 0.45 - 1.24 mg/dL  4.09  8.11   Sodium 914 - 145 mmol/L  134  134   Potassium 3.5 - 5.1 mmol/L  3.9  4.2   Chloride 98 - 111 mmol/L  93  94   CO2 22 - 32 mmol/L  30  31   Calcium 8.9 - 10.3 mg/dL  8.9  8.9   Total Protein 6.5 - 8.1 g/dL 6.8     Total Bilirubin 0.0 - 1.2 mg/dL 0.6     Alkaline Phos 38 - 126 U/L 72     AST 15 - 41 U/L 17     ALT 0 - 44 U/L 19         Microbiology:  Studies/Results: CT CHEST ABDOMEN PELVIS W CONTRAST Result Date: 10/15/2023 CLINICAL DATA:  Sepsis EXAM: CT CHEST, ABDOMEN, AND PELVIS WITH CONTRAST TECHNIQUE: Multidetector CT imaging of the chest, abdomen and pelvis was performed following the standard protocol during bolus administration of intravenous contrast. RADIATION DOSE REDUCTION: This exam was performed according to the departmental dose-optimization program which includes automated exposure control, adjustment of the mA and/or kV according to patient size and/or use of iterative reconstruction technique. CONTRAST:  80mL OMNIPAQUE IOHEXOL 300 MG/ML  SOLN COMPARISON:  09/25/2023 FINDINGS: CT CHEST FINDINGS Cardiovascular: Heart is normal size. Aorta is normal caliber. Coronary artery and aortic atherosclerosis. Mediastinum/Nodes: No mediastinal, hilar, or axillary adenopathy. Trachea and esophagus are unremarkable. Thyroid unremarkable. Lungs/Pleura: Severe emphysema. Biapical scarring. Consolidation in the lower lobes bilaterally, similar to prior study most  suggestive of pneumonia. No effusions. Musculoskeletal: Chest wall soft tissues are unremarkable. No acute bony abnormality. CT ABDOMEN PELVIS FINDINGS Hepatobiliary: No focal hepatic abnormality. Gallbladder unremarkable. Pancreas: No focal abnormality or ductal dilatation.  Spleen: No focal abnormality.  Normal size. Adrenals/Urinary Tract: No adrenal abnormality. No focal renal abnormality. No stones or hydronephrosis. Urinary bladder is unremarkable. Stomach/Bowel: NG tube is in the stomach. Mild gaseous distention of the colon. Stomach and small bowel decompressed. Vascular/Lymphatic: No evidence of aneurysm or adenopathy. Aortic atherosclerosis. Reproductive: No visible focal abnormality. Other: No free fluid or free air. Musculoskeletal: No acute bony abnormality. IMPRESSION: Continued consolidation in the lower lobes bilaterally concerning for pneumonia. Severe emphysema. Coronary artery disease, aortic atherosclerosis. Mild gaseous distention of the colon, particularly transverse colon may reflect mild ileus. Electronically Signed   By: Charlett Nose M.D.   On: 10/15/2023 00:59   DG Chest 2 View Result Date: 10/14/2023 CLINICAL DATA:  Fever and shortness of breath. EXAM: CHEST - 2 VIEW COMPARISON:  Chest radiograph dated 10/11/2023. FINDINGS: Enteric tube extends below the diaphragm the tip beyond the inferior margin of the image. Bilateral lower lobe opacities again noted and not significantly change. Small pleural effusions suspected. No pneumothorax. The cardiac silhouette is within normal limits. No acute osseous pathology. IMPRESSION: Persistent bilateral lower lobe opacities. Continued follow-up recommended. Electronically Signed   By: Elgie Collard M.D.   On: 10/14/2023 13:49     Assessment/Plan: Recent RSV infection of the lung COPD exacerbation treated with steroids and nebulizer and some oxygen Patient has been on 20 mg of prednisone for the past many months.  It has been reduced to 10  mg need to make sure that there is no HPA suppression adrenal insufficiency which can cause fever   Intermittent fever-  has been on antibiotics thorught with few days of break no response to zosyn Rt lower lobe and left lower lobe infitrate  Recommend pulmonary consult   Sacral decubitus is not the cause of the fever    Oropharyngeal candidiasis- start fluconazole  Observe off antibiotic If clincal status change then start meropenem and vanco   Anemia   Hyponatremia   Hyperglycemia   Hypoalbuminemia  If diarrhea then will check for cdiff   Quant gold Sputum culture Repeat respiratory panel PCR Neg Discussed the management with Dr Nelson Chimes and [facility]

## 2023-10-15 NOTE — Progress Notes (Addendum)
Nutrition Follow-up  DOCUMENTATION CODES:   Severe malnutrition in context of acute illness/injury  INTERVENTION:   -D/c calorie count -TF via NGT (transition to nocturnal feeds):   Osmolite 1.5 @ 80 ml/hr x 14 hours   60 ml Prosource TF BID   30 ml free water flush every 4 hours   Tube feeding regimen provides 1840 kcal (97% of needs), 110 grams of protein, and 853 ml of H2O.  Total free water: 1033 ml daily   -Continue Vitamin C 500mg  BID via tube -Continue Thiamine 100mg  dialy via tube x 7 days  -Continue Juven Fruit Punch BID via tube, each serving provides 95kcal and 2.5g of protein (amino acids glutamine and arginine) -Pt at high refeed risk; recommend monitor potassium, magnesium and phosphorus labs daily until stable -Continue daily weights  -Pharmacy consult for electrolyte management  -If pt desires aggressive care and this aligns with goals of care, may need to consider permanent feeding acces (ex PEG) pending GOC discussions  NUTRITION DIAGNOSIS:   Severe Malnutrition related to acute illness as evidenced by moderate fat depletion, severe fat depletion, moderate muscle depletion, severe muscle depletion.  Ongoing  GOAL:   Patient will meet greater than or equal to 90% of their needs  Unmet  MONITOR:   Diet advancement, Labs, Weight trends, TF tolerance, Skin, I & O's  REASON FOR ASSESSMENT:   Consult Enteral/tube feeding initiation and management  ASSESSMENT:   63 y/o male with h/o COPD, DVT, HLD, GERD, pulmonary nodule and DM who is admitted with RSV, pneumonia and COPD exacerbation.  1/27- extubated  1/29- s/p BSE- advanced to dysphagia 1 diet with thin liquids 2/3- NGT placed, TF started 2/9- s/p BSE- advanced to dysphagia 2 diet with thin liquids 2/10- s/p BSE- remain on dysphagia 2 diet with thin liquids  Reviewed I/O's: -470 ml x 24 hours and -6.4 L since 10/01/23  UOP: 500 ml x 24 hours  Per [facility]OCN note on 10/14/23, pt with unstageable  pressure injury to sacrum.   Pt very somnolent at time of visit. He did not respond to name being called. Pt received nocturnal feedings last night. He continues to refuse PO intake.   2/10 Breakfast: 0% completed Lunch: 0% completed Dinner: 0% completed   Total intake: 0 kcal (0% of minimum estimated needs)  0 grams protein (0% of minimum estimated needs)  2/11 Breakfast: 0% completed Lunch: 0% completed Dinner: 0% completed   Total intake: 0 kcal (0% of minimum estimated needs)  0 grams protein (0% of minimum estimated needs)  Reviewed wt hx; pt has experienced a 9.7% wt loss over the past week.  Case discussed with MD, RN, SLP, and TOC. Pt unable to meet nutritional needs PO. Per discussion with MD, pt does not want PEG. If pt cannot discharge to [facility], NGT remains a barrier for discharge. RD addressed concerns and recommended considering re-visiting goals of care. Per MD, plan discuss PEG with pt again and possibly re-engage palliative care regarding goals of care.   Per MD notes, pt now desires PEG. Per IR, will likely not be placed until next week when infection resolves. MD wants to continue with nocturnal feeds, however, will increase rate due to refusal of oral intake.   Per TOC notes, appeal letter to [facility] has been sent.   Medications reviewed and include predisone, protonix, and vitamin C.   Labs reviewed: Na: 134, CBGS: 109-221 (inpatient orders for glycemic control are 0-9 units insulin aspart every 4 hours).  Diet Order:   Diet Order             DIET DYS 2 Room service appropriate? Yes with Assist; Fluid consistency: Thin  Diet effective now                   EDUCATION NEEDS:   Education needs have been addressed  Skin:  Skin Assessment: Skin Integrity Issues: Skin Integrity Issues:: Unstageable Stage II: - Unstageable: sacrum Other: IAD to buttocks  Last BM:  10/11/23  Height:   Ht Readings from Last 1 Encounters:  09/10/23 6' (1.829 m)     Weight:   Wt Readings from Last 1 Encounters:  10/15/23 48.5 kg    Ideal Body Weight:  80.9 kg  BMI:  Body mass index is 14.5 kg/m.  Estimated Nutritional Needs:   Kcal:  1900-2200kcal/day  Protein:  95-110g/day  Fluid:  1.8-2.1L/day    Levada Schilling, RD, LDN, CDCES Registered Dietitian III Certified Diabetes Care and Education Specialist If unable to reach this RD, please use "RD Inpatient" group chat on secure chat between hours of 8am-4 pm daily

## 2023-10-15 NOTE — Progress Notes (Signed)
Occupational Therapy Treatment Patient Details Name: Benjamin Bolton MRN: [id number] DOB: 1961-04-26 Today's Date: 10/15/2023   History of present illness 63 y.o. 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   OT comments  Pt is supine in bed on arrival. Easily arousable, but only agreeable to minimal bed level OT session. He reports pain in his buttocks and back. Linens were rolled up under him, therefore he rolled to bil sides with SUP to reposition sheets/pads and place pillow under for pressure relief. He needed Max A to scoot to [facility]. Lotion applied to his back d/t c/o itching-edu on time spent sitting EOB to promote better skin integrity. Pt refusing all mobility and EOB attempts today stating "I just don't want to, I don't feel good and I want to go home." Extensive time spent reasoning through him going home and being bed bound, but he is adamant he wants to go home with his sister. Nurse notified of his desire for pain meds. Pt left in bed with all needs in place and will cont to require skilled acute OT services to maximize his safety and IND to return to PLOF.       If plan is discharge home, recommend the following:  Two people to help with walking and/or transfers;A lot of help with bathing/dressing/bathroom;Help with stairs or ramp for entrance;Assistance with cooking/housework;Assist for transportation;Assistance with feeding   Equipment Recommendations  Other (comment) (defer)    Recommendations for Other Services      Precautions / Restrictions Precautions Precautions: Fall Restrictions Weight Bearing Restrictions Per Provider Order: No       Mobility Bed Mobility Overal bed mobility: Needs Assistance Bed Mobility: Rolling Rolling: Supervision,  Used rails         General bed mobility comments: SUP to roll side to side in bed and Max A to reposition to [facility]    Transfers                         Balance                                           ADL either performed or assessed with clinical judgement   ADL                                         General ADL Comments: total assist to have lotion applied to his back    Extremity/Trunk Assessment              Vision       Perception     Praxis     Communication Communication Communication:  (low volume)   Cognition Arousal: Alert Behavior During Therapy: WFL for tasks assessed/performed               OT - Cognition Comments: cognition seems to  come and go                          Cueing   Cueing Techniques: Verbal cues, Tactile cues  Exercises Other  Exercises Other Exercises: Extensive time spent educating on importance of OOB activity/time to prevent further decline/weakness.    Shoulder Instructions       General Comments pt refused mobility states "I'm not walking for nothing." "I don't feel good and I want to go home."    Pertinent Vitals/ Pain       Pain Assessment Pain Assessment: Faces Faces Pain Scale: Hurts little more Pain Location: R shoulder and R flank region Pain Descriptors / Indicators: Discomfort Pain Intervention(s): Monitored during session, Repositioned  Home Living                                          Prior Functioning/Environment              Frequency  Min 1X/week        Progress Toward Goals  OT Goals(current goals can now be found in the care plan section)  Progress towards OT goals: Progressing toward goals  Acute Rehab OT Goals Patient Stated Goal: "go home" OT Goal Formulation: With patient Time For Goal Achievement: 10/29/23 Potential to Achieve Goals: Fair  Plan      Co-evaluation                  AM-PAC OT "6 Clicks" Daily Activity     Outcome Measure   Help from another person eating meals?: A Lot Help from another person taking care of personal grooming?: A Lot Help from another person toileting, which includes using toliet, bedpan, or urinal?: Total Help from another person bathing (including washing, rinsing, drying)?: A Lot Help from another person to put on and taking off regular upper body clothing?: A Lot Help from another person to put on and taking off regular lower body clothing?: A Lot 6 Click Score: 11    End of Session Equipment Utilized During Treatment: Oxygen  OT Visit Diagnosis: Other abnormalities of gait and mobility (R26.89);Muscle weakness (generalized) (M62.81)   Activity Tolerance  (pt self limiting)   Patient Left in bed;with call bell/phone within reach   Nurse Communication Mobility status        Time: 1610-9604 OT Time Calculation (min): 18 min  Charges: OT General Charges $OT Visit: 1 Visit OT Treatments $Therapeutic Activity: 8-22 mins  Sequoyah Ramone, OTR/L  10/15/23, 3:14 PM   Kanyia Heaslip E Liviya Santini 10/15/2023, 3:11 PM

## 2023-10-15 NOTE — Progress Notes (Signed)
Progress Note   [name] ZOX:[id number] DOB: 08-31-1961 DOA: 09/10/2023     35 DOS: the patient was seen and examined on 10/15/2023   Brief hospital course: ICU transfer for 10/08/2023.  Taken from prior notes.  63 y.o. 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  Was successfully extubated on 1/27, currently on his baseline 3L nasal cannula, not requiring vasopressors.  Hospital course has been complicated questionable HAP and metabolic encephalopathy, which has limited his ability to participate with PT.  While he was Lucid after extubation, did make himself DNR/DNI in presence of family and chaplain.  He will remain full scope of medical care.  . Patient is chronically maintained on 20 mg of prednisone which will continue for now but will require a prolonged taper. He is refusing certain aspects of his care which is challenging given his overall comorbidities.   Initial PT recommendations for CIR which were downgraded to SNF due to his ability to participate with physical therapy.  Might need LTAC placement.  Echocardiogram done on 09/14/2023 with low normal EF and indeterminate diastolic parameters.  Troponin peaked at 115.  Did receive pressors intermittently while in ICU.  Cardiology was consulted which later signed off and no further cardiac evaluation indicated at this time.  There was a questionable superimposed HAP which was treated with antibiotics.  Multiple electrolyte abnormalities which include mild hyperkalemia, hyponatremia and hypophosphatemia which were resolved with repletion.  2/5: Vital stable with borderline soft blood pressure, saturating 100% on 2 L of oxygen, temperature of 100.4 recorded over the past 24-hour.  Labs with slight  worsening of leukocytosis at 12.5, rest of the labs stable, CRP improving, CBG elevated. Sputum cultures from 1/22 with normal respiratory flora.  Patient is currently on Zosyn. Very muffled voice and weak cough.  Patient is quite debilitated and is appropriate for LTAC as recommended and PCCM note. Patient is on tube feed through NGT as having postintubation dysphagia.  2/6: Hemodynamically and labs seems stable, mild hypophosphatemia at 2.4.  Anemia panel consistent with anemia of chronic disease with low iron and saturation, ferritin elevated.  Had a bed offer at select LTAC, pending insurance authorization.  Patient is reluctant for PEG tube, stating that improvement in dysphagia and he will like to try more p.o. intake. Small improvement in voice and cough reflex.  Completed the course of Zosyn.  2/7: Hemodynamically stable, slowly improving voice and cough.  Improving CRP.  Pending insurance authorization for LTAC.  Still does not want PEG tube.  2/8: Oxycodone added at his request due to complaint of generalized bodyaches.  Worsening cough and leukocytosis today, repeating chest x-ray.  Ordered a repeat swallow evaluation. Patient just completed course of Zosyn.  2/9: Overnight quite agitated and unable to sleep.  Ativan helped.  Complaining of generalized aches and pain, appears to have very poor insight of his condition.  Labs with mild pseudohyponatremia secondary to hyperglycemia.  Per patient no appetite but p.o. intake slowly improving. Repeat chest x-ray with persistent but improving bilateral pneumonia. CRP again started trending up.  Continue to have some diarrhea, ordered CBC as add-on  2/10: LTAC placement was declined by insurance even with peer to peer review.  They want a definitive feeding plan in place.  Patient with poor appetite but able  to swallow now.  We will try giving him a break from NG tube, he was on a continuous field.  Might pull the NG tube out and see if he can  take adequate p.o. intake.  If he continued to need tube feeding then PEG will be the best option but patient does not want it.  2/11: Patient became febrile again overnight, maximum temperature recorded of 102.  CRP again started trending up, slight worsening of leukocytosis, repeat chest x-ray with increasing infiltrate.  Patient recently received multiple courses of antibiotic.  UA does not look infected.  Infectious disease was also consulted.  Blood, sputum and wound cultures ordered.  Starting on Zosyn Calorie count with p.o. intake started, tube feeding switched to bolus if he is unable to eat p.o. Appeal was made for [facility] decision.  2/12: Maximum temperature recorded over the past 24 hours was 100.2, remained tachycardic, worsening leukocytosis at 14.4, and CRP.  CT chest abdomen and pelvis with continued consolidation in lower lobes bilaterally concerning for pneumonia.  Mild gaseous distention of colon, may reflect mild ileus, repeat respiratory viral panel negative, cultures are pending. Patient agrees to PEG placement, IR was consulted and they are would like to wait until current fever and concern of infection is sorted out.  Patient also need 48 hours of Eliquis break before placement. LTAC appeal still pending  Patient is high risk for worsening comorbidities and mortality, palliative care was consulted.  Assessment and Plan: * Acute on chronic respiratory failure with hypoxia (HCC) COPD exacerbation-treated and resolved RSV pneumonia-treated Concern of superimposed HAP. S/p prolonged intubation with advanced underlying COPD. Currently saturating well on 2 L of oxygen which is his baseline. Postintubation muffled voice and weak cough. Currently on Zosyn for for concern of HAP although tracheal cultures with normal respiratory flora.  Inflammatory markers seems improving. -Completed the course of Zosyn initially but it was restarted yesterday on 2/11 with the development of  fever -Continue supplemental oxygen -Incentive spirometry and flutter valve -Chest PT -Continue with supportive care -Repeat swallow evaluation due to worsening cough -Repeat chest x-ray-with persistent bilateral pneumonia  Fever Patient became febrile again with worsening leukocytosis and CRP.  Repeat chest x-ray today with worsening infiltrate, ordered repeat blood, sputum and wound cultures.  Diarrhea has been resolved.  UA does not look infected.  Preliminary blood cultures negative, sputum cultures pending.  Legionella and TB QuantiFERON was also sent by ID CT chest, abdomen and pelvis with concern of bilateral basal opacities/pneumonia. ID consult Follow-up cultures Starting on Zosyn  COPD exacerbation (HCC) -Continue with prednisone taper  DVT (deep venous thrombosis) (HCC) Patient has history of DVT. -Continue Eliquis  Protein-calorie malnutrition, severe (HCC)  Swallowing much improved. Started calorie count as p.o. intake slowly improving, appetite remained poor and unable to meet his caloric requirement, losing weight.  Now agree for PEG tube placement-which is deferred by IR to improve current infection as he became febrile again.  Patient also need 48 hours of Eliquis break, likely be done early next week Continues tube feed has been switched with bolus if he is unable to take much p.o. -Continue to monitor   Electrolyte abnormality Patient had multiple electrolyte abnormalities during this complicated course of hospitalization which involved mild hyperkalemia, hyponatremia and hypophosphatemia which has been improved. -Continue to monitor-replete as needed  Normochromic anemia Hemoglobin currently stable at 8.7.  Likely due to chronic disease. Anemia panel consistent with anemia of chronic disease -Monitor hemoglobin -Transfuse if below 7  Pre-diabetes A1c of 6.4, CBG currently elevated. Patient is on tube feed and steroid. -Continue with SSI  Elevated  troponin Echocardiogram done on 09/14/2023 with 50 to 55% EF, indeterminate diastolic parameter.  Likely secondary to demand ischemia.  Troponin peaked at 115 Initially cardiology was consulted and later the signed off, no intervention required.  Diarrhea Improved.  No abdominal pain but to have mild leukocytosis. High risk for C. Difficile. -Continue with supportive care      Subjective: Patient was feeling quite tired and sleepy when seen today.  Continue to have significant cough.  No appetite.  Physical Exam: Vitals:   10/15/23 1105 10/15/23 1134 10/15/23 1200 10/15/23 1245  BP:   96/68   Pulse:   (!) 103   Resp: 17 17 (!) 22   Temp:  (!) 101.2 F (38.4 C)  (!) 100.8 F (38.2 C)  TempSrc:  Axillary  Axillary  SpO2:   100%   Weight:      Height:       General.  Frail and severely malnourished gentleman, in no acute distress. Pulmonary.  Lungs clear bilaterally, normal respiratory effort. CV.  Regular rate and rhythm, no JVD, rub or murmur. Abdomen.  Soft, nontender, nondistended, BS positive. CNS.  Little somnolent but arousable.  No focal neurologic deficit. Extremities.  No edema, no cyanosis, pulses intact and symmetrical.  Data Reviewed: Prior data reviewed  Family Communication: Talked with sister on phone.  Disposition: Status is: Inpatient Remains inpatient appropriate because: Severity of illness  Planned Discharge Destination: LTAC  DVT prophylaxis.  Eliquis Time spent: 50 minutes  This record has been created using Conservation officer, historic buildings. Errors have been sought and corrected,but may not always be located. Such creation errors do not reflect on the standard of care.   Author: Arnetha Courser, MD 10/15/2023 2:25 PM  For on call review [email or web address].

## 2023-10-16 DIAGNOSIS — E43 Unspecified severe protein-calorie malnutrition: Secondary | ICD-10-CM | POA: Diagnosis not present

## 2023-10-16 DIAGNOSIS — J9621 Acute and chronic respiratory failure with hypoxia: Secondary | ICD-10-CM | POA: Diagnosis not present

## 2023-10-16 DIAGNOSIS — Z515 Encounter for palliative care: Secondary | ICD-10-CM | POA: Diagnosis not present

## 2023-10-16 DIAGNOSIS — R509 Fever, unspecified: Secondary | ICD-10-CM | POA: Diagnosis not present

## 2023-10-16 DIAGNOSIS — I824Y9 Acute embolism and thrombosis of unspecified deep veins of unspecified proximal lower extremity: Secondary | ICD-10-CM | POA: Diagnosis not present

## 2023-10-16 DIAGNOSIS — J441 Chronic obstructive pulmonary disease with (acute) exacerbation: Secondary | ICD-10-CM | POA: Diagnosis not present

## 2023-10-16 LAB — CBC WITH DIFFERENTIAL/PLATELET
Abs Immature Granulocytes: 0.15 10*3/uL — ABNORMAL HIGH (ref 0.00–0.07)
Basophils Absolute: 0 10*3/uL (ref 0.0–0.1)
Basophils Relative: 0 %
Eosinophils Absolute: 0 10*3/uL (ref 0.0–0.5)
Eosinophils Relative: 0 %
HCT: 19.1 % — ABNORMAL LOW (ref 39.0–52.0)
Hemoglobin: 6 g/dL — ABNORMAL LOW (ref 13.0–17.0)
Immature Granulocytes: 1 %
Lymphocytes Relative: 14 %
Lymphs Abs: 2.3 10*3/uL (ref 0.7–4.0)
MCH: 29.7 pg (ref 26.0–34.0)
MCHC: 31.4 g/dL (ref 30.0–36.0)
MCV: 94.6 fL (ref 80.0–100.0)
Monocytes Absolute: 1.7 10*3/uL — ABNORMAL HIGH (ref 0.1–1.0)
Monocytes Relative: 10 %
Neutro Abs: 12.3 10*3/uL — ABNORMAL HIGH (ref 1.7–7.7)
Neutrophils Relative %: 75 %
Platelets: 376 10*3/uL (ref 150–400)
RBC: 2.02 MIL/uL — ABNORMAL LOW (ref 4.22–5.81)
RDW: 15 % (ref 11.5–15.5)
WBC: 16.6 10*3/uL — ABNORMAL HIGH (ref 4.0–10.5)
nRBC: 0 % (ref 0.0–0.2)

## 2023-10-16 LAB — GLUCOSE, CAPILLARY
Glucose-Capillary: 125 mg/dL — ABNORMAL HIGH (ref 70–99)
Glucose-Capillary: 173 mg/dL — ABNORMAL HIGH (ref 70–99)
Glucose-Capillary: 179 mg/dL — ABNORMAL HIGH (ref 70–99)
Glucose-Capillary: 222 mg/dL — ABNORMAL HIGH (ref 70–99)
Glucose-Capillary: 239 mg/dL — ABNORMAL HIGH (ref 70–99)
Glucose-Capillary: 65 mg/dL — ABNORMAL LOW (ref 70–99)
Glucose-Capillary: 99 mg/dL (ref 70–99)

## 2023-10-16 LAB — COMPREHENSIVE METABOLIC PANEL
ALT: 22 U/L (ref 0–44)
AST: 24 U/L (ref 15–41)
Albumin: 2.5 g/dL — ABNORMAL LOW (ref 3.5–5.0)
Alkaline Phosphatase: 70 U/L (ref 38–126)
Anion gap: 10 (ref 5–15)
BUN: 33 mg/dL — ABNORMAL HIGH (ref 8–23)
CO2: 32 mmol/L (ref 22–32)
Calcium: 9.3 mg/dL (ref 8.9–10.3)
Chloride: 94 mmol/L — ABNORMAL LOW (ref 98–111)
Creatinine, Ser: 0.55 mg/dL — ABNORMAL LOW (ref 0.61–1.24)
GFR, Estimated: >60 mL/min (ref >60)
Glucose, Bld: 133 mg/dL — ABNORMAL HIGH (ref 70–99)
Potassium: 4.6 mmol/L (ref 3.5–5.1)
Sodium: 136 mmol/L (ref 135–145)
Total Bilirubin: 0.5 mg/dL (ref 0.0–1.2)
Total Protein: 6.3 g/dL — ABNORMAL LOW (ref 6.5–8.1)

## 2023-10-16 LAB — C-REACTIVE PROTEIN: CRP: 12 mg/dL — ABNORMAL HIGH (ref <1.0)

## 2023-10-16 LAB — LEGIONELLA PNEUMOPHILA SEROGP 1 UR AG: L. pneumophila Serogp 1 Ur Ag: NEGATIVE

## 2023-10-16 LAB — CORTISOL-AM, BLOOD: Cortisol - AM: 11.8 ug/dL (ref 6.7–22.6)

## 2023-10-16 NOTE — Plan of Care (Signed)
Problem: Education: Goal: Knowledge of General Education information will improve Description: Including pain rating scale, medication(s)/side effects and non-pharmacologic comfort measures Outcome: Progressing   Problem: Health Behavior/Discharge Planning: Goal: Ability to manage health-related needs will improve Outcome: Progressing   Problem: Clinical Measurements: Goal: Ability to maintain clinical measurements within normal limits will improve Outcome: Progressing Goal: Will remain free from infection Outcome: Progressing Goal: Diagnostic test results will improve Outcome: Progressing Goal: Respiratory complications will improve Outcome: Progressing Goal: Cardiovascular complication will be avoided Outcome: Progressing   Problem: Activity: Goal: Risk for activity intolerance will decrease Outcome: Progressing   Problem: Nutrition: Goal: Adequate nutrition will be maintained Outcome: Progressing   Problem: Coping: Goal: Level of anxiety will decrease Outcome: Progressing   Problem: Elimination: Goal: Will not experience complications related to bowel motility Outcome: Progressing Goal: Will not experience complications related to urinary retention Outcome: Progressing   Problem: Pain Management: Goal: General experience of comfort will improve Outcome: Progressing   Problem: Safety: Goal: Ability to remain free from injury will improve Outcome: Progressing   Problem: Skin Integrity: Goal: Risk for impaired skin integrity will decrease Outcome: Progressing   Problem: Education: Goal: Knowledge of disease or condition will improve Outcome: Progressing Goal: Knowledge of the prescribed therapeutic regimen will improve Outcome: Progressing   Problem: Activity: Goal: Ability to tolerate increased activity will improve Outcome: Progressing Goal: Will verbalize the importance of balancing activity with adequate rest periods Outcome: Progressing   Problem:  Respiratory: Goal: Ability to maintain a clear airway will improve Outcome: Progressing Goal: Levels of oxygenation will improve Outcome: Progressing Goal: Ability to maintain adequate ventilation will improve Outcome: Progressing   Problem: Education: Goal: Ability to describe self-care measures that may prevent or decrease complications (Diabetes Survival Skills Education) will improve Outcome: Progressing   Problem: Coping: Goal: Ability to adjust to condition or change in health will improve Outcome: Progressing   Problem: Fluid Volume: Goal: Ability to maintain a balanced intake and output will improve Outcome: Progressing   Problem: Health Behavior/Discharge Planning: Goal: Ability to identify and utilize available resources and services will improve Outcome: Progressing Goal: Ability to manage health-related needs will improve Outcome: Progressing   Problem: Metabolic: Goal: Ability to maintain appropriate glucose levels will improve Outcome: Progressing   Problem: Nutritional: Goal: Maintenance of adequate nutrition will improve Outcome: Progressing Goal: Progress toward achieving an optimal weight will improve Outcome: Progressing   Problem: Skin Integrity: Goal: Risk for impaired skin integrity will decrease Outcome: Progressing   Problem: Tissue Perfusion: Goal: Adequacy of tissue perfusion will improve Outcome: Progressing   Problem: Activity: Goal: Ability to tolerate increased activity will improve Outcome: Progressing   Problem: Respiratory: Goal: Ability to maintain a clear airway and adequate ventilation will improve Outcome: Progressing   Problem: Role Relationship: Goal: Method of communication will improve Outcome: Progressing

## 2023-10-16 NOTE — Plan of Care (Signed)
  Problem: Activity: Goal: Risk for activity intolerance will decrease Outcome: Progressing   Problem: Nutrition: Goal: Adequate nutrition will be maintained Outcome: Progressing   Problem: Coping: Goal: Level of anxiety will decrease Outcome: Progressing   Problem: Pain Management: Goal: General experience of comfort will improve Outcome: Progressing   Problem: Safety: Goal: Ability to remain free from injury will improve Outcome: Progressing

## 2023-10-16 NOTE — Progress Notes (Signed)
Date of Admission:  09/10/2023      ID: Benjamin Bolton is a 63 y.o. male  Principal Problem:   Acute on chronic respiratory failure with hypoxia (HCC) Active Problems:   COPD exacerbation (HCC)   DVT (deep venous thrombosis) (HCC)   Protein-calorie malnutrition, severe (HCC)   Tobacco abuse   Electrolyte abnormality   Normochromic anemia   Pre-diabetes   Elevated troponin   Diarrhea   Fever   Benjamin Bolton is a 63 y.o. male with a history of COPD on home oxygen, hyperlipidemia, prediabetes, DVT on Eliquis presented to [facility] ED on 09/10/2023.  Patient has been in the hospital since then. He was diagnosed with COPD exacerbation, RSV viral infection and pneumonia.  Started on high-dose prednisone, antibiotics.  He was intubated between 09/12/2023 until 09/28/2023. Antibiotic regimen has been ceftriaxone 09/10/2023 until 09/12/2023 Zosyn 09/13/2023 until 09/16/2023 and then restarted on 09/21/2023 until 09/23/2023 Vancomycin 09/21/2023 until 09/22/2023 Cefepime 09/25/2023 until 09/29/2023 Linezolid 09/25/2023 until 09/26/2023 Azithromycin 09/29/2023 until 10/01/2023 Doxycycline 2/1-10/05/23  Zosyn to 2/2 until 10/09/2023 Vancomycin to 10/05/23 until 10/06/2023 Zosyn 10/14/23-2/12  Subjective: Pt states he is feeling better Drank ensure Sitting in bed More alert  Medications:   apixaban  5 mg Oral BID   vitamin C  500 mg Oral BID   feeding supplement  237 mL Oral TID BM   feeding supplement (PROSource TF20)  60 mL Per Tube BID   fluconazole  200 mg Oral Daily   free water  30 mL Per Tube Q4H   insulin aspart  0-9 Units Subcutaneous Q4H   Ipratropium-Albuterol  1 puff Inhalation Q8H   leptospermum manuka honey  1 Application Topical Daily   metoprolol tartrate  12.5 mg Oral BID   multivitamin with minerals  1 tablet Oral Daily   nicotine  14 mg Transdermal Daily   nutrition supplement (JUVEN)  1 packet Per Tube BID BM   mouth rinse  15 mL Mouth Rinse 4 times per day   pantoprazole (PROTONIX) IV   40 mg Intravenous QHS   polyethylene glycol  17 g Oral Daily   predniSONE  20 mg Oral Q breakfast   sodium chloride flush  10-40 mL Intracatheter Q12H    Objective: Vital signs in last 24 hours: Patient Vitals for the past 24 hrs:  BP Temp Temp src Pulse Resp SpO2 Weight  10/16/23 0733 -- 100.2 F (37.9 C) Oral -- -- -- --  10/16/23 0507 -- -- -- -- -- -- 51.5 kg  10/16/23 0400 112/76 99.7 F (37.6 C) Oral -- (!) 23 -- --  10/16/23 0000 107/80 99.1 F (37.3 C) Oral 94 (!) 26 99 % --  10/15/23 2000 107/68 99.8 F (37.7 C) Oral (!) 116 19 96 % --  10/15/23 1608 -- 98.8 F (37.1 C) Axillary -- 19 -- --  10/15/23 1500 102/65 -- -- (!) 114 (!) 23 98 % --      PHYSICAL EXAM:  General: alert and Awake, ,more energy chronically ill, Oral cavity thrush Voice hoarse Neck: Supple, symmetrical, no adenopathy, thyroid: non tender no carotid bruit and no JVD. Back: tender sacrum Lungs:b/l air entry- crepts bases. Heart: s1s2-  Abdomen: Soft, non-tender,not distended. Bowel sounds normal. No masses Extremities: atraumatic, no cyanosis. No edema. No clubbing Skin: No rashes or lesions. Or bruising Lymph: Cervical, supraclavicular normal. Neurologic: Grossly non-focal  Lab Results    Latest Ref Rng & Units 10/16/2023    9:51 AM 10/15/2023    4:26  AM 10/14/2023    2:41 AM  CBC  WBC 4.0 - 10.5 K/uL 16.6  14.4  12.2   Hemoglobin 13.0 - 17.0 g/dL 6.0  9.0  9.8   Hematocrit 39.0 - 52.0 % 19.1  27.6  30.9   Platelets 150 - 400 K/uL 376  352  368        Latest Ref Rng & Units 10/16/2023    9:51 AM 10/14/2023    6:29 PM 10/14/2023    2:41 AM  CMP  Glucose 70 - 99 mg/dL 409   811   BUN 8 - 23 mg/dL 33   24   Creatinine 9.14 - 1.24 mg/dL 7.82   9.56   Sodium 213 - 145 mmol/L 136   134   Potassium 3.5 - 5.1 mmol/L 4.6   3.9   Chloride 98 - 111 mmol/L 94   93   CO2 22 - 32 mmol/L 32   30   Calcium 8.9 - 10.3 mg/dL 9.3   8.9   Total Protein 6.5 - 8.1 g/dL 6.3  6.8    Total Bilirubin  0.0 - 1.2 mg/dL 0.5  0.6    Alkaline Phos 38 - 126 U/L 70  72    AST 15 - 41 U/L 24  17    ALT 0 - 44 U/L 22  19        Microbiology: 2/11 BC- NG 2/1 sputum pending  Studies/Results: CT CHEST ABDOMEN PELVIS W CONTRAST Result Date: 10/15/2023 CLINICAL DATA:  Sepsis EXAM: CT CHEST, ABDOMEN, AND PELVIS WITH CONTRAST TECHNIQUE: Multidetector CT imaging of the chest, abdomen and pelvis was performed following the standard protocol during bolus administration of intravenous contrast. RADIATION DOSE REDUCTION: This exam was performed according to the departmental dose-optimization program which includes automated exposure control, adjustment of the mA and/or kV according to patient size and/or use of iterative reconstruction technique. CONTRAST:  80mL OMNIPAQUE IOHEXOL 300 MG/ML  SOLN COMPARISON:  09/25/2023 FINDINGS: CT CHEST FINDINGS Cardiovascular: Heart is normal size. Aorta is normal caliber. Coronary artery and aortic atherosclerosis. Mediastinum/Nodes: No mediastinal, hilar, or axillary adenopathy. Trachea and esophagus are unremarkable. Thyroid unremarkable. Lungs/Pleura: Severe emphysema. Biapical scarring. Consolidation in the lower lobes bilaterally, similar to prior study most suggestive of pneumonia. No effusions. Musculoskeletal: Chest wall soft tissues are unremarkable. No acute bony abnormality. CT ABDOMEN PELVIS FINDINGS Hepatobiliary: No focal hepatic abnormality. Gallbladder unremarkable. Pancreas: No focal abnormality or ductal dilatation. Spleen: No focal abnormality.  Normal size. Adrenals/Urinary Tract: No adrenal abnormality. No focal renal abnormality. No stones or hydronephrosis. Urinary bladder is unremarkable. Stomach/Bowel: NG tube is in the stomach. Mild gaseous distention of the colon. Stomach and small bowel decompressed. Vascular/Lymphatic: No evidence of aneurysm or adenopathy. Aortic atherosclerosis. Reproductive: No visible focal abnormality. Other: No free fluid or free  air. Musculoskeletal: No acute bony abnormality. IMPRESSION: Continued consolidation in the lower lobes bilaterally concerning for pneumonia. Severe emphysema. Coronary artery disease, aortic atherosclerosis. Mild gaseous distention of the colon, particularly transverse colon may reflect mild ileus. Electronically Signed   By: Charlett Nose M.D.   On: 10/15/2023 00:59     Assessment/Plan: RSV infection of the lung COPD exacerbation treated with steroids and nebulizer and some oxygen   Intermittent fever-  rhas been on antibiotics thorught with few days of break no response to zosyn Patient has been on 20 mg of prednisone for the past many months.  It has been reduced to 10 mg ,,need to make sure that there  is no HPA suppression adrenal insufficiency which can cause fever- [prednisone was increased to 20 mg  and now he is feeling better  Rt lower lobe and left lower lobe infitrate since admisison- could be atelectasis pulmonary consult   Sacral decubitus is not the cause of the fever    Oropharyngeal candidiasis- started fluconazole  Observe off antibiotic If clincal status change then start meropenem and vanco   Anemia   Hyponatremia   Hyperglycemia   Hypoalbuminemia  If diarrhea then will check for cdiff    Discussed the management with Dr Nelson Chimes and [facility]

## 2023-10-16 NOTE — Progress Notes (Signed)
Nutrition Follow-up  DOCUMENTATION CODES:   Severe malnutrition in context of acute illness/injury  INTERVENTION:   -TF via NGT (transition to nocturnal feeds):    Osmolite 1.5 @ 80 ml/hr x 14 hours   60 ml Prosource TF BID   30 ml free water flush every 4 hours   Tube feeding regimen provides 1840 kcal (97% of needs), 110 grams of protein, and 853 ml of H2O.  Total free water: 1033 ml daily   -Continue Vitamin C 500mg  BID via tube -Continue Thiamine 100mg  dialy via tube x 7 days  -Continue Juven Fruit Punch BID via tube, each serving provides 95kcal and 2.5g of protein (amino acids glutamine and arginine) -Continue daily weights  -Continue Ensure Enlive po TID, each supplement provides 350 kcal and 20 grams of protein -When PEG is placed, can transition to bolus feeds if desired:   237 ml (1 carton) Osmolite 1.5 6 times daily  50 ml free water flush before and after each feeding administration  Tube feeding regimen provides 2130 kcal (100% of needs), 89 grams of protein, and 1086 ml of H2O. Total free water: 1686 ml daily  NUTRITION DIAGNOSIS:   Severe Malnutrition related to acute illness as evidenced by moderate fat depletion, severe fat depletion, moderate muscle depletion, severe muscle depletion.  Ongoing  GOAL:   Patient will meet greater than or equal to 90% of their needs  Met with TF  MONITOR:   Diet advancement, Labs, Weight trends, TF tolerance, Skin, I & O's  REASON FOR ASSESSMENT:   Consult Enteral/tube feeding initiation and management  ASSESSMENT:   63 y/o male with h/o COPD, DVT, HLD, GERD, pulmonary nodule and DM who is admitted with RSV, pneumonia and COPD exacerbation.  1/27- extubated  1/29- s/p BSE- advanced to dysphagia 1 diet with thin liquids 2/3- NGT placed, TF started 2/9- s/p BSE- advanced to dysphagia 2 diet with thin liquids 2/10- s/p BSE- remain on dysphagia 2 diet with thin liquids  Reviewed I/O's: -510 ml x 24 hours and  -6.4 L since 10/02/23  UOP: 750 ml x 24 hours   Pt lying in bed at time of visit. Pt did not respond to voice. Noted pt consumed about half of an Ensure supplement, which is an improvement. Intake still remains inadequate. Meal completions documented at 0%.   Pt continues to receive nocturnal feeds via NGT: Osmolite 1.5 @ 80 ml/hr x 12 hours, 60 ml Prosource TF BID. Feedings increased yesterday secondary to weight loss.   Per discussion with MD, pt now desires PEG. Per IR, will likely not be placed until next week when infection resolves.   Per TOC notes, appeal letter to [facility] has been sent.   Medications reviewed and include vitamin C, diflucan, protonix, miralax, and prednisone.   Noted wt increase since last visit. RD will continue to monitor wt trends and adjust as appropriate.   Labs reviewed: CBGS: 99-222 (inpatient orders for glycemic control are 0-9 units insulin aspart every 4 hours).    Diet Order:   Diet Order             DIET DYS 2 Room service appropriate? Yes with Assist; Fluid consistency: Thin  Diet effective now                   EDUCATION NEEDS:   Education needs have been addressed  Skin:  Skin Assessment: Skin Integrity Issues: Skin Integrity Issues:: Unstageable Stage II: - Unstageable: sacrum Other: IAD to buttocks  Last BM:  10/11/23  Height:   Ht Readings from Last 1 Encounters:  09/10/23 6' (1.829 m)    Weight:   Wt Readings from Last 1 Encounters:  10/16/23 51.5 kg    Ideal Body Weight:  80.9 kg  BMI:  Body mass index is 15.4 kg/m.  Estimated Nutritional Needs:   Kcal:  1900-2200kcal/day  Protein:  95-110g/day  Fluid:  1.8-2.1L/day    Levada Schilling, RD, LDN, CDCES Registered Dietitian III Certified Diabetes Care and Education Specialist If unable to reach this RD, please use "RD Inpatient" group chat on secure chat between hours of 8am-4 pm daily

## 2023-10-16 NOTE — Progress Notes (Signed)
Palliative Care Progress Note, Assessment & Plan   Patient Name: Benjamin Bolton       Date: 10/16/2023 DOB: 1961/01/23  Age: 63 y.o. MRN#: [id number] Attending Physician: Arnetha Courser, MD Primary Care Physician: [facility] Admit Date: 09/10/2023  Subjective: Patient is lying in bed and leaning to his right side.  NG tube to right nare in place.  He has male PureWick in his hand. He is wet with urine and shares he has already asked for someone to come and clean him up. No family or friends present during my visit.  HPI: 63 y.o. 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  Patient has been successfully extubated, however has required NG tube for nutritional support.  He remains weak and intermittently compliant with PT/OT/medication regimen compliance.  Patient has been declined potential for admission to [facility] inpatient rehab. Pt may be LTAC appropriate awaiting appeal.  Summary of counseling/coordination of care: Extensive chart review completed prior to meeting patient including labs, vital signs, imaging, progress notes, orders, and available advanced directive documents from current and previous encounters.   After reviewing the patient's chart and assessing the patient at bedside, I spoke with patient in regards to symptom management and goals of care.   Symptoms assessed.  Patient has acute complaints of nose and throat soreness.  He shares she is ready for the tube to come out so that he can swallow better.  He shares that it is difficult for him to communicate due to his  hoarseness from the tubes in his throat.  Counseled with patient that tube remains for nutritional support.  Discussed use of NG temporarily with possibility to pull it and replace it with a PEG tube.  Or, to pull it, knowing we would not to place a PEG.  Pros and cons of PEG tube discussed with patient.  I highlighted patient has always remained adamant that he would not be accepting of a permanent feeding tube.  He again endorsed that to get today.  He shares he does not want a permanent feeding tube.  He wants to "move forward with getting this 1 out".  I discussed that if this 1 is removed, patient will be encouraged to keep up with his nutritional intake by natural means.  He shares he wants to give this a try.  I discussed that plan remains for patient to hopefully be transferred to a long-term care hospital where he can get the appropriate care needed.  He shares he is ready and willing to go.  I again highlighted boundaries and goals of care with patient.  He remains in agreement with DNR with limited interventions.  Sister Benjamin Bolton remains his HCPOA next of kin Management consultant.  After visiting with the patient, I spoke with attending Dr. Nelson Chimes.  Shared above.  Plan is to hold off on IR evaluation for PEG tube.  Plan to remove NGT and see if patient can increase p.o. intake.  After speaking with the patient about symptoms and goals of  care, I spoke with his sister Benjamin Bolton over the phone.  Conveyed above discussion with patient.  She remains in agreement for NG to be removed and see how patient does with regular p.o. intake.  She shares concern that patient will not be able to eat enough or perhaps he will aspirate.  I shared that he is starting with a dysphagia 2 diet and that we will be monitoring him closely.  He is very eager to eat and food will be offered when the patient requested and he is awake and alert or and oriented enough to safely take it in.  She is relieved by our discussion.  I  cautioned sister that while patient's more alert and interactive, he still has a chronic, progressive, and irreversible lung disease that is not improving.  She shares understanding but remains hopeful that he will have more quality time ahead.  No adjustment to plan of care at this time.  PMT remains available to patient and family throughout his hospitalization and will continue to follow when appropriate.  Physical Exam Vitals reviewed.  Constitutional:      General: He is not in acute distress. HENT:     Head: Normocephalic.  Cardiovascular:     Rate and Rhythm: Normal rate.  Pulmonary:     Effort: Pulmonary effort is normal.  Musculoskeletal:     Comments: MAETC, generalized weakness  Skin:    General: Skin is warm and dry.  Neurological:     Mental Status: He is alert and oriented to person, place, and time.  Psychiatric:        Mood and Affect: Mood normal. Mood is not anxious.        Behavior: Behavior normal. Behavior is not agitated.             Total Time 50 minutes   Time spent includes: Detailed review of medical records (labs, imaging, vital signs), medically appropriate exam (mental status, respiratory, cardiac, skin), discussed with treatment team, counseling and educating patient, family and staff, documenting clinical information, medication management and coordination of care.  Samara Deist L. Bonita Quin, DNP, FNP-BC Palliative Medicine Team

## 2023-10-16 NOTE — Progress Notes (Signed)
 NAME:  Benjamin Bolton, MRN:  [id number], DOB:  [birth year], LOS: 36 ADMISSION DATE:  09/10/2023, CONSULTATION DATE: 09/13/2023  Brief Pt Description / Synopsis:  63 y.o. 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     Significant Hospital Events: Including procedures, antibiotic start and stop dates in addition to other pertinent events   1/08: admit to [facility] [address] 1/11: rapid response, transfer to ICU, intubated 1/12: remains vented, sedated, does not follow commands.  Failed SBT  1/13: Overnight due to concerns of aspiration TF's held.  Will repeat SBT today 1/14: Overnight pt had possible seizure activity with rhythmic tremors noted in the mouth/lower face/neck during episode pt unresponsive to pain.  Received 2 mg of versed and 2g keppra bolus with resolution of symptoms.  CT Head negative.  Standing dose of keppra 1g bid.  Neurology consulted.    1/15: On minimal vent settings, unable to tolerate WUA due to increased WOB. Shifting measures given for Hyperkalemia of 5.5 with peaked T waves.  Abdomen distended, KUB without evidence of obstruction, give Lactulose.  Diurese with 40 mg IV Lasix x1. 1/16: Failed WUA, with increased WOB and hypoxic with O2 sats dropping to mid 70's.  Add Seroquel.  Palliative Care consulted to assist with GOC. 1/17: Precedex started for WUA.  Failed SBT due to increased WOB, tachypnea, and hypoxia.  Increase Seroquel to 50 mg BID. Transition Heparin to Elqiuis 1/18: Pt remains mechanically intubated and has failed multiple SBT's.  Planning for family meeting today at bedside with palliative care and PCCM will perform SBT once family arrives at bedside.  Remains sedated with propofol and fentanyl gtts.  Family meeting held code status changed to DNR per family  request.  Pts family trying to decide if they want to proceed with tracheostomy.  Required additional sedation and prn vecuronium overnight due to vent dyssynchrony  1/19: Pt febrile overnight with increased vasopressor requirements levophed gtt @12  mcg/min and febrile tmax 101 degrees F concerning for recurrent sepsis.  Broad spectrum abx started. Pt remains mechanically intubated and unable to successfully liberate from ventilator  1/20 remains on vent, severe rap failure 1/21 remains on vent prolonged exp phase air trapping  on vent 1/22 remains on vent 1/23 CT ABD- Colonic ileus pattern shows significant improvement since the prior radiograph with no further significant gaseous distension of the colon. 1/23 CT chest B/L Lower lobe pneumonia severe emphysema 1/23 CT head-possible cerebral edema 1/23 remains on vent failure to wean 1/24 remains intubated, severe hypoxia, shock 1/25 remains intubated, severe hypoxia, shock 09/29/23- Extubated to BIPAP, I met with family at bedside and reviewed medical plan as well as answered questions.  09/30/23- patient is more awake and able to speak few words asking for water.  10/01/23- patient on HFNC 40/40 able to communicate verbally, will perform SLP and PT/OT eval. Req precedex for aggitation weaning.  CRP trending down and steroids tapering in parallel. 10/02/23- patient weaned to 4L/min, he is eating.  For PT/OT today.  Patient being optimized for TRH transfer.  Weaning from precedex. Patient reports depression but expresses that he wishes to live and wants to continue working towards improvement.  10/03/23- patient was aggitated today and was curing at staff.  He did bedside swallow and passed.  He is on prednisone with mild leukocytosis. Today he  is being optimized for transfer to medical floor.  10/04/23- patient is having weak cough and is not compliant with medications.  He refuses most therapy ordered.  I have called multiple family members and asked  them to come in to encourage him to work with [place]. His wife and sister came in and report he wants to use crack which he normally does routinely.  I've offered medications for withdrawal and called palliative care evaluation due to overall poor prognosis.  10/05/23- patient was more compliant after family came in and encouraged him to work with [place].  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  His leukocytosis and CRP are incrementing despite tapering dose of steroids concerning for infectious PNA.  Cough reflex is poor.  Palliative has seen him and he is DNR/DNI but wants to keep full score of care.  Hypersal and mucomyst neb with RT initiate for cough/airway clearance.   10/10/2023 - Patient seen at bedside.  He remains intermittently A&Ox2-3.  He remains on oxygen [place] 2-4L sating at 92-95%.  Cough remains weak with poor mucus mobilization.  He continues with Chest PT .  WBC 10.5 - Continues on prednisone.  IS and flutter valve at bedside and not used independently.   Discharge is pending LTAC approval.  Concerns for superimposed HAP and is on zosyn.  COPD is stable at this time with the prednisone.   10/16/2023 - Patient seen at bedside.  He is alert and orient x 3.  Plans to remove NGT today.  Continues with concercn for HAP  Maximum temperature recorded over the past 24 hours was 100.84F, RR 26 last night now improved, remained tachycardic, worsening leukocytosis at 16.6, and CRP11.6 , SED rate 106.  CT chest abdomen and pelvis with continued consolidation in lower lobes bilaterally concerning for pneumonia. Legionella, Fungitell Beta in process.  Resp Panel repeated 2/12 and remains positive for RSV.   Continues on Fluconazole and Combivent inhaler Q8 and 20mg  prednisone QAM  Pertinent  Medical History  DVT on Eliquis COPD on 2L O2 Type II Diabetes Mellitus  GERD HLD Pulmonary Nodule Tobacco Abuse   Micro Data:  1/8: SARS-CoV-2  PCR>>negative 1/8: Blood culture x2>> no growth 1/8: Urine>> multiple species (suggest recollection) 1/9: RVP>> + RSV 1/9: Sputum>> normal respiratory flora 1/9: HIV Screen>> non reactive 1/11: MRSA PCR>> negative 1/19: MRSA PCR>> 1/19: RVP>> 2/12:  RVP>> +RSV  Antimicrobials:  Fuconazole, Zosyn   Anti-infectives (From admission, onward)    Start     Dose/Rate Route Frequency Ordered Stop   10/15/23 1730  fluconazole (DIFLUCAN) tablet 200 mg        200 mg Oral Daily 10/15/23 1634     10/14/23 1600  piperacillin-tazobactam (ZOSYN) IVPB 3.375 g  Status:  Discontinued        3.375 g 12.5 mL/hr over 240 Minutes Intravenous Every 8 hours 10/14/23 1433 10/15/23 1508   10/06/23 0500  vancomycin (VANCOREADY) IVPB 750 mg/150 mL  Status:  Discontinued       Placed in "Followed by" Linked Group   750 mg 150 mL/hr over 60 Minutes Intravenous Every 12 hours 10/05/23 1412 10/05/23 1737   10/06/23 0500  vancomycin (VANCOCIN) 750 mg in sodium chloride 0.9 % 250 mL IVPB  Status:  Discontinued        750 mg 250 mL/hr over 60 Minutes Intravenous Every 12 hours 10/05/23 1737 10/06/23 1338   10/05/23 1700  vancomycin (  VANCOREADY) IVPB 1250 mg/250 mL       Placed in "Followed by" Linked Group   1,250 mg 166.7 mL/hr over 90 Minutes Intravenous  Once 10/05/23 1412 10/05/23 1808   10/05/23 1600  piperacillin-tazobactam (ZOSYN) IVPB 3.375 g        3.375 g 12.5 mL/hr over 240 Minutes Intravenous Every 8 hours 10/05/23 1410 10/09/23 2144   10/04/23 2200  azithromycin (ZITHROMAX) tablet 250 mg  Status:  Discontinued        250 mg Oral Daily at bedtime 10/04/23 0907 10/04/23 1150   10/04/23 1200  doxycycline (VIBRAMYCIN) 100 mg in sodium chloride 0.9 % 250 mL IVPB  Status:  Discontinued        100 mg 125 mL/hr over 120 Minutes Intravenous 2 times daily 10/04/23 1147 10/05/23 1410   09/29/23 2000  azithromycin (ZITHROMAX) 250 mg in dextrose 5 % 125 mL IVPB  Status:  Discontinued        250 mg 127.5 mL/hr  over 60 Minutes Intravenous Every 24 hours 09/29/23 1801 10/04/23 0907   09/29/23 1245  azithromycin (ZITHROMAX) tablet 250 mg  Status:  Discontinued        250 mg Per Tube Daily 09/29/23 1150 09/29/23 1801   09/25/23 1200  linezolid (ZYVOX) IVPB 600 mg  Status:  Discontinued        600 mg 300 mL/hr over 60 Minutes Intravenous Every 12 hours 09/25/23 0918 09/26/23 1206   09/25/23 1015  ceFEPIme (MAXIPIME) 2 g in sodium chloride 0.9 % 100 mL IVPB  Status:  Discontinued        2 g 200 mL/hr over 30 Minutes Intravenous Every 8 hours 09/25/23 0918 09/29/23 1053   09/25/23 0000  ceFAZolin (ANCEF) IVPB 2g/100 mL premix        2 g 200 mL/hr over 30 Minutes Intravenous  Once 09/23/23 1103 09/25/23 0013   09/22/23 0010  vancomycin (VANCOCIN) IVPB 750 mg/150 ml premix  Status:  Discontinued        750 mg 150 mL/hr over 60 Minutes Intravenous Every 12 hours 09/22/23 0004 09/22/23 1039   09/22/23 0000  Vancomycin (VANCOCIN) 750 mg IVPB  Status:  Discontinued        750 mg 150 mL/hr over 60 Minutes Intravenous Every 12 hours 09/21/23 1120 09/21/23 2356   09/22/23 0000  vancomycin (VANCOREADY) IVPB 750 mg/150 mL  Status:  Discontinued        750 mg 150 mL/hr over 60 Minutes Intravenous Every 12 hours 09/21/23 2356 09/22/23 0004   09/21/23 1300  vancomycin (VANCOCIN) IVPB 1000 mg/200 mL premix        1,000 mg 200 mL/hr over 60 Minutes Intravenous  Once 09/21/23 1120 09/21/23 1900   09/21/23 1215  piperacillin-tazobactam (ZOSYN) IVPB 3.375 g  Status:  Discontinued        3.375 g 12.5 mL/hr over 240 Minutes Intravenous Every 8 hours 09/21/23 1120 09/23/23 1257   09/21/23 1130  vancomycin (VANCOREADY) IVPB 1250 mg/250 mL  Status:  Discontinued        1,250 mg 166.7 mL/hr over 90 Minutes Intravenous  Once 09/21/23 1046 09/21/23 1120   09/21/23 1100  piperacillin-tazobactam (ZOSYN) IVPB 3.375 g  Status:  Discontinued        3.375 g 12.5 mL/hr over 240 Minutes Intravenous  Once 09/21/23 1046 09/21/23 1152    09/13/23 1445  piperacillin-tazobactam (ZOSYN) IVPB 3.375 g        3.375 g 12.5 mL/hr over 240 Minutes Intravenous  Every 8 hours 09/13/23 1351 09/17/23 0157   09/10/23 2315  cefTRIAXone (ROCEPHIN) 1 g in sodium chloride 0.9 % 100 mL IVPB  Status:  Discontinued        1 g 200 mL/hr over 30 Minutes Intravenous Every 24 hours 09/10/23 2305 09/13/23 1336          REVIEW OF SYSTEMS Patient reports weak cough denies CP, denies dyspnea despite visible shallow breathing, aside from this 10 point ROS is negative   PHYSICAL EXAMINATION:   GENERAL:chronically ill appearing age appropriate EYES: Pupils equal, round, reactive to light.  No scleral icterus.  MOUTH: Moist mucosal membrane.  NECK: Supple.  PULMONARY: Lungs clear to auscultation lower lobes, +rhonchi in upper lobes CARDIOVASCULAR: S1 and S2.  Regular rhythm, mild tachycardia GASTROINTESTINAL: Soft, nontender, -distended. Positive bowel sounds.  MUSCULOSKELETAL: edema.  NEUROLOGIC: improved mentation A&Ox3 SKIN:normal, warm to touch, Capillary refill delayed  Pulses present bilaterally    Interim History / Subjective:   1.Acute on chronic hypoxemic respiratory failure          RSV pneumonia      - Extubated 09/29/23     Leukocytosis trending up, have broadened coverage for post viral secondary PNA  2. Advanced COPD       Continue abx        Continue on nebulizer's, Chest PT, Prednisone 20mg  - Will need taper  3. PAF - on cardiac monitoring       Currently in SR  - on heprin sq  4. Substance abuse with drug withdrawal syndrome             Patient reports heavy cocaine smoking and EtOH and oxycontin daily.  He continues with craving sensations is at high risk for relapse  ENDO -check FSBS per protocol   GI GI PROPHYLAXIS as indicated NUTRITIONAL STATUS DIET advance as tolerated.   Constipation protocol as indicated CT ABD ILEUS IMPROVING    RESTRICTIVE TRANSFUSION PROTOCOL TRANSFUSION  IF HGB<7  or ACTIVE  BLEEDING OR DX of ACUTE CORONARY SYNDROMES     CT ABD ILEUS resolved - placed NGT for nourishment - to be removed - ordered by inpatient     Objective   Blood pressure 112/76, pulse 94, temperature 100.2 F (37.9 C), temperature source Oral, resp. rate (!) 23, height 6' (1.829 m), weight 51.5 kg, SpO2 99%.        Intake/Output Summary (Last 24 hours) at 10/16/2023 1152 Last data filed at 10/16/2023 0755 Gross per 24 hour  Intake 270 ml  Output 750 ml  Net -480 ml   Filed Weights   10/14/23 0600 10/15/23 0504 10/16/23 0507  Weight: 50.1 kg 48.5 kg 51.5 kg      I have discussed Plan of Care and interpretation of testing with patient as well as supervising physician required for management or comanagement of patient's concerns and medical findings.      Linde Gillis. Ahnaf Caponi, NPc  Vida Rigger, MD - Collaborating Physician  Pulmonary & Critical Care Medicine  [facility]   Vida Rigger, M.D.  Pulmonary & Critical Care Medicine

## 2023-10-16 NOTE — Progress Notes (Signed)
Progress Note   [name] ZOX:[id number] DOB: March 10, 1961 DOA: 09/10/2023     36 DOS: the patient was seen and examined on 10/16/2023   Brief hospital course: ICU transfer for 10/08/2023.  Taken from prior notes.  63 y.o. 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  Was successfully extubated on 1/27, currently on his baseline 3L nasal cannula, not requiring vasopressors.  Hospital course has been complicated questionable HAP and metabolic encephalopathy, which has limited his ability to participate with PT.  While he was Lucid after extubation, did make himself DNR/DNI in presence of family and chaplain.  He will remain full scope of medical care.  . Patient is chronically maintained on 20 mg of prednisone which will continue for now but will require a prolonged taper. He is refusing certain aspects of his care which is challenging given his overall comorbidities.   Initial PT recommendations for CIR which were downgraded to SNF due to his ability to participate with physical therapy.  Might need LTAC placement.  Echocardiogram done on 09/14/2023 with low normal EF and indeterminate diastolic parameters.  Troponin peaked at 115.  Did receive pressors intermittently while in ICU.  Cardiology was consulted which later signed off and no further cardiac evaluation indicated at this time.  There was a questionable superimposed HAP which was treated with antibiotics.  Multiple electrolyte abnormalities which include mild hyperkalemia, hyponatremia and hypophosphatemia which were resolved with repletion.  2/5: Vital stable with borderline soft blood pressure, saturating 100% on 2 L of oxygen, temperature of 100.4 recorded over the past 24-hour.  Labs with slight  worsening of leukocytosis at 12.5, rest of the labs stable, CRP improving, CBG elevated. Sputum cultures from 1/22 with normal respiratory flora.  Patient is currently on Zosyn. Very muffled voice and weak cough.  Patient is quite debilitated and is appropriate for LTAC as recommended and PCCM note. Patient is on tube feed through NGT as having postintubation dysphagia.  2/6: Hemodynamically and labs seems stable, mild hypophosphatemia at 2.4.  Anemia panel consistent with anemia of chronic disease with low iron and saturation, ferritin elevated.  Had a bed offer at select LTAC, pending insurance authorization.  Patient is reluctant for PEG tube, stating that improvement in dysphagia and he will like to try more p.o. intake. Small improvement in voice and cough reflex.  Completed the course of Zosyn.  2/7: Hemodynamically stable, slowly improving voice and cough.  Improving CRP.  Pending insurance authorization for LTAC.  Still does not want PEG tube.  2/8: Oxycodone added at his request due to complaint of generalized bodyaches.  Worsening cough and leukocytosis today, repeating chest x-ray.  Ordered a repeat swallow evaluation. Patient just completed course of Zosyn.  2/9: Overnight quite agitated and unable to sleep.  Ativan helped.  Complaining of generalized aches and pain, appears to have very poor insight of his condition.  Labs with mild pseudohyponatremia secondary to hyperglycemia.  Per patient no appetite but p.o. intake slowly improving. Repeat chest x-ray with persistent but improving bilateral pneumonia. CRP again started trending up.  Continue to have some diarrhea, ordered CBC as add-on  2/10: LTAC placement was declined by insurance even with peer to peer review.  They want a definitive feeding plan in place.  Patient with poor appetite but able  to swallow now.  We will try giving him a break from NG tube, he was on a continuous field.  Might pull the NG tube out and see if he can  take adequate p.o. intake.  If he continued to need tube feeding then PEG will be the best option but patient does not want it.  2/11: Patient became febrile again overnight, maximum temperature recorded of 102.  CRP again started trending up, slight worsening of leukocytosis, repeat chest x-ray with increasing infiltrate.  Patient recently received multiple courses of antibiotic.  UA does not look infected.  Infectious disease was also consulted.  Blood, sputum and wound cultures ordered.  Starting on Zosyn Calorie count with p.o. intake started, tube feeding switched to bolus if he is unable to eat p.o. Appeal was made for [facility] decision.  2/12: Maximum temperature recorded over the past 24 hours was 100.2, remained tachycardic, worsening leukocytosis at 14.4, and CRP.  CT chest abdomen and pelvis with continued consolidation in lower lobes bilaterally concerning for pneumonia.  Mild gaseous distention of colon, may reflect mild ileus, repeat respiratory viral panel negative, cultures are pending. Patient agrees to PEG placement, IR was consulted and they are would like to wait until current fever and concern of infection is sorted out.  Patient also need 48 hours of Eliquis break before placement. LTAC appeal still pending  Patient is high risk for worsening comorbidities and mortality, palliative care was consulted.  2/13: Patient remained febrile, NG tube was removed today so he can try improving his p.o. intake, patient will not decide about PEG tube after p.o. challenge.  ID to decide about whether to add further antibiotics, worsening inflammatory markers.  Patient still not ready for hospice.  Assessment and Plan: * Acute on chronic respiratory failure with hypoxia (HCC) COPD exacerbation-treated and resolved RSV pneumonia-treated Concern of superimposed HAP. S/p prolonged intubation with advanced underlying COPD. Currently saturating well on 2 L of oxygen which is his  baseline. Postintubation muffled voice and weak cough. Currently on Zosyn for for concern of HAP although tracheal cultures with normal respiratory flora.  Inflammatory markers seems improving. -Completed the course of Zosyn initially but it was restarted yesterday on 2/11 with the development of fever -Continue supplemental oxygen -Incentive spirometry and flutter valve -Chest PT -Continue with supportive care -Repeat swallow evaluation due to worsening cough -Repeat chest x-ray-with persistent bilateral pneumonia  Fever Patient became febrile again with worsening leukocytosis and CRP.  Repeat chest x-ray today with worsening infiltrate, ordered repeat blood, sputum and wound cultures.  Diarrhea has been resolved.  UA does not look infected.  Preliminary blood cultures negative, sputum cultures pending.  Legionella and TB QuantiFERON was also sent by ID CT chest, abdomen and pelvis with concern of bilateral basal opacities/pneumonia. ID consult Follow-up cultures Starting on Zosyn  COPD exacerbation (HCC) -Continue with prednisone taper  DVT (deep venous thrombosis) (HCC) Patient has history of DVT. -Continue Eliquis  Protein-calorie malnutrition, severe (HCC)  Swallowing much improved. Started calorie count as p.o. intake slowly improving, appetite remained poor and unable to meet his caloric requirement, losing weight.   NGT was removed today, IR was consulted yesterday for PEG tube placement when he agreed, today he again saying that does not want PEG tube and wants to give himself a p.o. challenge. -Continue to monitor   Electrolyte abnormality Patient had multiple electrolyte abnormalities during this complicated course of hospitalization which involved mild hyperkalemia, hyponatremia and hypophosphatemia which has been improved. -Continue to  monitor-replete as needed  Normochromic anemia Hemoglobin currently stable at 8.7.  Likely due to chronic disease. Anemia panel  consistent with anemia of chronic disease -Monitor hemoglobin -Transfuse if below 7  Pre-diabetes A1c of 6.4, CBG currently elevated. Patient is on tube feed and steroid. -Continue with SSI  Elevated troponin Echocardiogram done on 09/14/2023 with 50 to 55% EF, indeterminate diastolic parameter.  Likely secondary to demand ischemia.  Troponin peaked at 115 Initially cardiology was consulted and later the signed off, no intervention required.  Diarrhea Improved.  No abdominal pain but to have mild leukocytosis. High risk for C. Difficile. -Continue with supportive care      Subjective: Patient continued to feel weak and saying that he is not getting better.  He wants to try more p.o. intake before PEG tube now.  Does not want to give up  Physical Exam: Vitals:   10/16/23 0000 10/16/23 0400 10/16/23 0507 10/16/23 0733  BP: 107/80 112/76    Pulse: 94     Resp: (!) 26 (!) 23    Temp: 99.1 F (37.3 C) 99.7 F (37.6 C)  100.2 F (37.9 C)  TempSrc: Oral Oral  Oral  SpO2: 99%     Weight:   51.5 kg   Height:       General.  Frail and severely malnourished gentleman, in no acute distress. Pulmonary.  Lungs clear bilaterally, normal respiratory effort. CV.  Regular rate and rhythm, no JVD, rub or murmur. Abdomen.  Soft, nontender, nondistended, BS positive. CNS.  Alert and oriented .  No focal neurologic deficit. Extremities.  No edema, no cyanosis, pulses intact and symmetrical.  Data Reviewed: Prior data reviewed  Family Communication:   Disposition: Status is: Inpatient Remains inpatient appropriate because: Severity of illness  Planned Discharge Destination: LTAC  DVT prophylaxis.  Eliquis Time spent: 50 minutes  This record has been created using Conservation officer, historic buildings. Errors have been sought and corrected,but may not always be located. Such creation errors do not reflect on the standard of care.   Author: Arnetha Courser, MD 10/16/2023 4:12 PM  For on  call review [email or web address].

## 2023-10-17 DIAGNOSIS — F17210 Nicotine dependence, cigarettes, uncomplicated: Secondary | ICD-10-CM | POA: Diagnosis not present

## 2023-10-17 DIAGNOSIS — J9621 Acute and chronic respiratory failure with hypoxia: Secondary | ICD-10-CM | POA: Diagnosis not present

## 2023-10-17 DIAGNOSIS — J441 Chronic obstructive pulmonary disease with (acute) exacerbation: Secondary | ICD-10-CM | POA: Diagnosis not present

## 2023-10-17 DIAGNOSIS — B974 Respiratory syncytial virus as the cause of diseases classified elsewhere: Secondary | ICD-10-CM

## 2023-10-17 DIAGNOSIS — E43 Unspecified severe protein-calorie malnutrition: Secondary | ICD-10-CM | POA: Diagnosis not present

## 2023-10-17 DIAGNOSIS — Z515 Encounter for palliative care: Secondary | ICD-10-CM | POA: Diagnosis not present

## 2023-10-17 LAB — BASIC METABOLIC PANEL
Anion gap: 10 (ref 5–15)
BUN: 22 mg/dL (ref 8–23)
CO2: 33 mmol/L — ABNORMAL HIGH (ref 22–32)
Calcium: 9.1 mg/dL (ref 8.9–10.3)
Chloride: 92 mmol/L — ABNORMAL LOW (ref 98–111)
Creatinine, Ser: 0.75 mg/dL (ref 0.61–1.24)
GFR, Estimated: >60 mL/min (ref >60)
Glucose, Bld: 145 mg/dL — ABNORMAL HIGH (ref 70–99)
Potassium: 4.1 mmol/L (ref 3.5–5.1)
Sodium: 135 mmol/L (ref 135–145)

## 2023-10-17 LAB — CBC
HCT: 25.4 % — ABNORMAL LOW (ref 39.0–52.0)
Hemoglobin: 8.4 g/dL — ABNORMAL LOW (ref 13.0–17.0)
MCH: 30.1 pg (ref 26.0–34.0)
MCHC: 33.1 g/dL (ref 30.0–36.0)
MCV: 91 fL (ref 80.0–100.0)
Platelets: 358 10*3/uL (ref 150–400)
RBC: 2.79 MIL/uL — ABNORMAL LOW (ref 4.22–5.81)
RDW: 15.1 % (ref 11.5–15.5)
WBC: 13.9 10*3/uL — ABNORMAL HIGH (ref 4.0–10.5)
nRBC: 0.2 % (ref 0.0–0.2)

## 2023-10-17 LAB — GLUCOSE, CAPILLARY
Glucose-Capillary: 101 mg/dL — ABNORMAL HIGH (ref 70–99)
Glucose-Capillary: 61 mg/dL — ABNORMAL LOW (ref 70–99)
Glucose-Capillary: 159 mg/dL — ABNORMAL HIGH (ref 70–99)
Glucose-Capillary: 174 mg/dL — ABNORMAL HIGH (ref 70–99)
Glucose-Capillary: 199 mg/dL — ABNORMAL HIGH (ref 70–99)

## 2023-10-17 LAB — TYPE AND SCREEN
ABO/RH(D): A POS
Antibody Screen: NEGATIVE

## 2023-10-17 LAB — MAGNESIUM: Magnesium: 2.1 mg/dL (ref 1.7–2.4)

## 2023-10-17 LAB — C-REACTIVE PROTEIN: CRP: 6.7 mg/dL — ABNORMAL HIGH (ref <1.0)

## 2023-10-17 LAB — PHOSPHORUS: Phosphorus: 2.4 mg/dL — ABNORMAL LOW (ref 2.5–4.6)

## 2023-10-17 MED ORDER — DEXTROSE 50 % IV SOLN: 1.0000 | INTRAVENOUS | Status: DC | PRN

## 2023-10-17 MED ORDER — INSULIN ASPART 100 UNIT/ML IJ SOLN
0.0000 [IU] | Freq: Three times a day (TID) | INTRAMUSCULAR | Status: DC
  Administered 2023-10-17 – 2023-10-18 (×3): 2 [IU] via SUBCUTANEOUS
  Administered 2023-10-18: 3 [IU] via SUBCUTANEOUS
  Administered 2023-10-18 – 2023-10-19 (×2): 1 [IU] via SUBCUTANEOUS
  Administered 2023-10-19 – 2023-10-20 (×2): 2 [IU] via SUBCUTANEOUS
  Administered 2023-10-21: 1 [IU] via SUBCUTANEOUS
  Administered 2023-10-22: 2 [IU] via SUBCUTANEOUS
  Administered 2023-10-22: 3 [IU] via SUBCUTANEOUS
  Administered 2023-10-22 (×2): 1 [IU] via SUBCUTANEOUS
  Administered 2023-10-23 (×3): 2 [IU] via SUBCUTANEOUS
  Administered 2023-10-23: 3 [IU] via SUBCUTANEOUS
  Administered 2023-10-24 (×2): 2 [IU] via SUBCUTANEOUS
  Administered 2023-10-24: 1 [IU] via SUBCUTANEOUS
  Administered 2023-10-24: 3 [IU] via SUBCUTANEOUS
  Administered 2023-10-25: 1 [IU] via SUBCUTANEOUS
  Administered 2023-10-25 (×2): 2 [IU] via SUBCUTANEOUS
  Administered 2023-10-25: 1 [IU] via SUBCUTANEOUS
  Administered 2023-10-26 – 2023-10-27 (×6): 2 [IU] via SUBCUTANEOUS
  Administered 2023-10-27: 1 [IU] via SUBCUTANEOUS
  Administered 2023-10-27: 3 [IU] via SUBCUTANEOUS
  Administered 2023-10-28 (×3): 2 [IU] via SUBCUTANEOUS
  Administered 2023-10-29: 1 [IU] via SUBCUTANEOUS
  Administered 2023-10-29: 2 [IU] via SUBCUTANEOUS
  Filled 2023-10-17 (×39): qty 1

## 2023-10-17 MED ORDER — APIXABAN 5 MG PO TABS: 5.0000 mg | ORAL_TABLET | Freq: Two times a day (BID) | ORAL | Status: DC

## 2023-10-17 MED ORDER — ENOXAPARIN SODIUM 60 MG/0.6ML IJ SOSY
1.0000 mg/kg | PREFILLED_SYRINGE | Freq: Two times a day (BID) | INTRAMUSCULAR | Status: AC
  Administered 2023-10-17: 52.5 mg via SUBCUTANEOUS
  Filled 2023-10-17: qty 0.6

## 2023-10-17 MED ORDER — K PHOS MONO-SOD PHOS DI & MONO 155-852-130 MG PO TABS
500.0000 mg | ORAL_TABLET | Freq: Once | ORAL | Status: AC
  Administered 2023-10-17: 500 mg via ORAL
  Filled 2023-10-17: qty 2

## 2023-10-17 NOTE — Progress Notes (Signed)
Date of Admission:  09/10/2023      ID: Benjamin Bolton is a 63 y.o. male  Principal Problem:   Acute on chronic respiratory failure with hypoxia (HCC) Active Problems:   COPD exacerbation (HCC)   DVT (deep venous thrombosis) (HCC)   Protein-calorie malnutrition, severe (HCC)   Tobacco abuse   Electrolyte abnormality   Normochromic anemia   Pre-diabetes   Elevated troponin   Diarrhea   Fever   Benjamin Bolton is a 63 y.o. male with a history of COPD on home oxygen, hyperlipidemia, prediabetes, DVT on Eliquis presented to [facility] ED on 09/10/2023.  Patient has been in the hospital since then. He was diagnosed with COPD exacerbation, RSV viral infection and pneumonia.  Started on high-dose prednisone, antibiotics.  He was intubated between 09/12/2023 until 09/28/2023. Antibiotic regimen has been ceftriaxone 09/10/2023 until 09/12/2023 Zosyn 09/13/2023 until 09/16/2023 and then restarted on 09/21/2023 until 09/23/2023 Vancomycin 09/21/2023 until 09/22/2023 Cefepime 09/25/2023 until 09/29/2023 Linezolid 09/25/2023 until 09/26/2023 Azithromycin 09/29/2023 until 10/01/2023 Doxycycline 2/1-10/05/23  Zosyn to 2/2 until 10/09/2023 Vancomycin to 10/05/23 until 10/06/2023 Zosyn 10/14/23-2/12    Subjective: Pt states he is feeling better Eating better  Medications:   [START ON 10/18/2023] apixaban  5 mg Oral BID   vitamin C  500 mg Oral BID   feeding supplement  237 mL Oral TID BM   fluconazole  200 mg Oral Daily   insulin aspart  0-9 Units Subcutaneous Q4H   Ipratropium-Albuterol  1 puff Inhalation Q8H   leptospermum manuka honey  1 Application Topical Daily   metoprolol tartrate  12.5 mg Oral BID   multivitamin with minerals  1 tablet Oral Daily   nicotine  14 mg Transdermal Daily   mouth rinse  15 mL Mouth Rinse 4 times per day   pantoprazole (PROTONIX) IV  40 mg Intravenous QHS   polyethylene glycol  17 g Oral Daily   predniSONE  20 mg Oral Q breakfast   sodium chloride flush  10-40 mL Intracatheter  Q12H    Objective: Vital signs in last 24 hours: Patient Vitals for the past 24 hrs:  BP Temp Temp src Pulse Resp SpO2 Weight  10/17/23 1100 93/70 99.5 F (37.5 C) Oral 97 -- 94 % --  10/17/23 0832 103/60 98.4 F (36.9 C) Oral (!) 109 -- 92 % --  10/17/23 0500 98/65 98.2 F (36.8 C) Oral 100 -- -- 51.3 kg  10/16/23 2300 113/78 -- -- 98 -- 100 % --  10/16/23 1942 112/70 98.5 F (36.9 C) Oral -- 20 94 % --      PHYSICAL EXAM:  General: alert and Awake, ,more energy chronically ill, Oral cavity thrush Voice hoarse Neck: Supple, symmetrical, no adenopathy, thyroid: non tender no carotid bruit and no JVD. Back: tender sacrum Lungs:b/l air entry- crepts bases. Heart: s1s2-  Abdomen: Soft, non-tender,not distended. Bowel sounds normal. No masses Extremities: atraumatic, no cyanosis. No edema. No clubbing Skin: No rashes or lesions. Or bruising Lymph: Cervical, supraclavicular normal. Neurologic: Grossly non-focal  Lab Results    Latest Ref Rng & Units 10/17/2023    9:30 AM 10/16/2023    9:51 AM 10/15/2023    4:26 AM  CBC  WBC 4.0 - 10.5 K/uL 13.9  16.6  14.4   Hemoglobin 13.0 - 17.0 g/dL 8.4  6.0  9.0   Hematocrit 39.0 - 52.0 % 25.4  19.1  27.6   Platelets 150 - 400 K/uL 358  376  352  Latest Ref Rng & Units 10/17/2023    9:30 AM 10/16/2023    9:51 AM 10/14/2023    6:29 PM  CMP  Glucose 70 - 99 mg/dL 604  540    BUN 8 - 23 mg/dL 22  33    Creatinine 9.81 - 1.24 mg/dL 1.91  4.78    Sodium 295 - 145 mmol/L 135  136    Potassium 3.5 - 5.1 mmol/L 4.1  4.6    Chloride 98 - 111 mmol/L 92  94    CO2 22 - 32 mmol/L 33  32    Calcium 8.9 - 10.3 mg/dL 9.1  9.3    Total Protein 6.5 - 8.1 g/dL  6.3  6.8   Total Bilirubin 0.0 - 1.2 mg/dL  0.5  0.6   Alkaline Phos 38 - 126 U/L  70  72   AST 15 - 41 U/L  24  17   ALT 0 - 44 U/L  22  19       Microbiology: 2/11 BC- NG 2/1 sputum pending  Studies/Results: No results found.    Assessment/Plan: RSV infection of the  lung COPD exacerbation treated with steroids and nebulizer and some oxygen   Intermittent fever-  resolved - likely due to adrenal insufficinecy Leucocytosis improving  was on antibiotics throughout with few days of break All antibiotics DC 48 hrs ago  Patient has been on 20 mg of prednisone for the past many months.  It has been reduced to 10 mg ,,need to make sure that there is no HPA suppression adrenal insufficiency which can cause fever- [prednisone was increased to 20 mg  and now he is feeling better  Rt lower lobe and left lower lobe infitrate since admisison- could be atelectasis pulmonary consult   Sacral decubitus is not the cause of the fever    Oropharyngeal candidiasis- I think he has esophageal candidiasis as well Continue fluconazole  X 14 days     Anemia   Hyponatremia   Hyperglycemia   Hypoalbuminemia  If diarrhea then will check for cdiff    Discussed the management with primary team ID will sign off- call if needed

## 2023-10-17 NOTE — Progress Notes (Addendum)
Palliative Care Progress Note, Assessment & Plan   Patient Name: Benjamin Bolton       Date: 10/17/2023 DOB: 04/01/1961  Age: 63 y.o. MRN#: [id number] Attending Physician: Gillis Santa, MD Primary Care Physician: [facility] Admit Date: 09/10/2023  Subjective: Patient is lying in bed with nasal cannula in place.  He acknowledges my presence and is able to make his wishes known.  His voice remains hoarse but he is alert and oriented x 4.  No family or friends present during my visit.  HPI: 63 y.o. 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  Patient has been successfully extubated, however has required NG tube for nutritional support.  He remains weak and intermittently compliant with PT/OT/medication regimen compliance.  Patient has been declined potential for admission to [facility] inpatient rehab. Pt may be LTAC appropriate awaiting appeal.   Summary of counseling/coordination of care: Extensive chart review completed prior to meeting patient including labs, vital signs, imaging, progress notes, orders, and available advanced directive documents from current and previous encounters.   After reviewing the patient's chart and assessing the patient at bedside, I spoke with patient in regards to symptom management and goals of care.   M times assessed.  Patient Dors is that it is easier for him to swallow but that he does not like any of the food.  Discussed importance of nutritional support as for overall improvement and prognosis.  He shares that he is hopeful that his sister will bring food  for him to eat.  I discussed that no food can be given until he shows [place] that he can tolerate the dysphagia to diet.  Reviewed that food is for fuel and not for fun at this point.  He shares that he would do his best to eat what has been given.  I again attempted to elicit values and goals important to the patient.  He says he is ready to go home.  Discussed that patient is requiring a higher level of skilled nursing care that cannot be provided for him at home.  Plan is for LTAC but awaiting appeal response.  He says he understands but ultimately just wants to be able to go home.  Counseled patient that he has a chronic, progressive, and irreversible lung disease-COPD.  Reviewed that this has not been improved by his poor functional nutritional status and extended hospitalization.  He shares understanding but if remains hopeful that he can get "a little bit better".  No adjustment to [facility] needed.  PMT remains available to patient and family throughout his hospitalization.  Please reach out to PMT with any acute palliative needs.  Physical Exam Constitutional:      Comments: Thin, frail  Eyes:     Pupils: Pupils are equal, round, and reactive to light.  Cardiovascular:     Rate and Rhythm: Normal rate.  Pulmonary:     Effort: Pulmonary effort is normal.  Musculoskeletal:     Cervical back: Normal range of motion.  Comments: Generalized weakness  Skin:    General: Skin is warm and dry.  Neurological:     Mental Status: He is alert and oriented to person, place, and time.             Total Time 35 minutes   Time spent includes: Detailed review of medical records (labs, imaging, vital signs), medically appropriate exam (mental status, respiratory, cardiac, skin), discussed with treatment team, counseling and educating patient, family and staff, documenting clinical information, medication management and coordination of care.  Samara Deist L. Bonita Quin, DNP, FNP-BC Palliative Medicine Team

## 2023-10-17 NOTE — Progress Notes (Signed)
Physical Therapy Treatment Patient Details Name: Benjamin Bolton MRN: [id number] DOB: May 03, 1961 Today's Date: 10/17/2023   History of Present Illness 63 y.o. 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    PT Comments  Pt received in Semi-Fowler's position and agreeable to therapy.  Pt initially not agreeable to out of bed activities, however with family encouragement and therapist encouragement, pt agreeable.  Pt was able to perform bed level transfer with good technique and come into seated EOB.  Pt assisted with +2 minA to come into standing.  Pt able to tolerate it for a few seconds and perform weight shifts and marching.  Pt then fatigued and required seated rest break.  Pt able to wash face with cloth, and brush teeth in seated position.  Pt then stood one more attempt to spit in sink.  Pt then elected to return to the bed and was left with all needs met.  Pt left with call bell within reach.     If plan is discharge home, recommend the following: A lot of help with walking and/or transfers;A lot of help with bathing/dressing/bathroom;Assist for transportation;Assistance with cooking/housework;Help with stairs or ramp for entrance   Can travel by private vehicle        Equipment Recommendations  Other (comment) (TBD)    Recommendations for Other Services Rehab consult     Precautions / Restrictions Precautions Precautions: Fall Restrictions Weight Bearing Restrictions Per Provider Order: No     Mobility  Bed Mobility Overal bed mobility: Needs Assistance   Rolling: Supervision   Supine to sit: Supervision     General bed mobility comments: SUP, increased time and motivation-no physical assist needed; lateral scoots to [facility] with SUP     Transfers Overall transfer level: Needs assistance Equipment used: Rolling walker (2 wheels) Transfers: Sit to/from Stand Sit to Stand: Min assist, +2 safety/equipment           General transfer comment: Min A x2 for STS via HHA to stand x2 trials from EOB at lowest height; able to take 1-2 forward steps to spit in sink for oral care    Ambulation/Gait               General Gait Details: pt ultimately declined any ambulation at this time.   Stairs             Wheelchair Mobility     Tilt Bed    Modified Rankin (Stroke Patients Only)       Balance Overall balance assessment: Needs assistance Sitting-balance support: Feet supported Sitting balance-Leahy Scale: Good Sitting balance - Comments: SUP for seated balance at EOB   Standing balance support: Bilateral upper extremity supported Standing balance-Leahy Scale: Poor Standing balance comment: BUE support via HHA and brief uniltaral on sink for oral care with Min A x2                            Communication Communication Communication:  (low volume)  Cognition Arousal: Alert Behavior During Therapy: WFL for tasks assessed/performed                             Following commands: Impaired Following commands impaired: Follows one step commands with increased  time    Cueing Cueing Techniques: Verbal cues, Tactile cues  Exercises Other Exercises Other Exercises: Extensive time spent educating on importance of OOB activity/time to prevent further decline/weakness.    General Comments General comments (skin integrity, edema, etc.): agitated and initially refusing, but finally gave in and agreed      Pertinent Vitals/Pain Pain Assessment Pain Assessment: No/denies pain    Home Living                          Prior Function            PT Goals (current goals can now be found in the care plan section) Acute Rehab PT Goals Patient Stated Goal: to get out  of here PT Goal Formulation: With patient Time For Goal Achievement: 10/31/23 Potential to Achieve Goals: Fair Progress towards PT goals: Not progressing toward goals - comment (Pt is self limiting progress.  Goal achievement time extended.)    Frequency    Min 1X/week      PT Plan      Co-evaluation   Reason for Co-Treatment: For patient/therapist safety;To address functional/ADL transfers PT goals addressed during session: Mobility/safety with mobility OT goals addressed during session: ADL's and self-care      AM-PAC PT "6 Clicks" Mobility   Outcome Measure  Help needed turning from your back to your side while in a flat bed without using bedrails?: A Little Help needed moving from lying on your back to sitting on the side of a flat bed without using bedrails?: A Lot Help needed moving to and from a bed to a chair (including a wheelchair)?: A Lot Help needed standing up from a chair using your arms (e.g., wheelchair or bedside chair)?: A Lot Help needed to walk in hospital room?: Total Help needed climbing 3-5 steps with a railing? : Total 6 Click Score: 11    End of Session   Activity Tolerance: Patient limited by fatigue Patient left: in bed;with call bell/phone within reach;with nursing/sitter in room Nurse Communication: Mobility status PT Visit Diagnosis: Unsteadiness on feet (R26.81);Muscle weakness (generalized) (M62.81);Difficulty in walking, not elsewhere classified (R26.2)     Time: 1610-9604 PT Time Calculation (min) (ACUTE ONLY): 24 min  Charges:    $Therapeutic Activity: 8-22 mins PT General Charges $$ ACUTE PT VISIT: 1 Visit                     Nolon Bussing, PT, DPT Physical Therapist - [facility]  10/17/23, 5:39 PM

## 2023-10-17 NOTE — Progress Notes (Signed)
Nutrition Follow-up  DOCUMENTATION CODES:   Severe malnutrition in context of acute illness/injury  INTERVENTION:   -D/c Osmolite 1.5, Prosource, and Juven due to lack of enteral access access -Continue 500 mg vitamin C BID -Continue MVI with minerals daily -Continue Ensure Enlive po TID, each supplement provides 350 kcal and 20 grams of protein -Magic cup TID with meals, each supplement provides 290 kcal and 9 grams of protein   NUTRITION DIAGNOSIS:   Severe Malnutrition related to acute illness as evidenced by moderate fat depletion, severe fat depletion, moderate muscle depletion, severe muscle depletion.  Ongoing  GOAL:   Patient will meet greater than or equal to 90% of their needs  Progressing   MONITOR:   Diet advancement, Labs, Weight trends, TF tolerance, Skin, I & O's  REASON FOR ASSESSMENT:   Consult Enteral/tube feeding initiation and management  ASSESSMENT:   63 y/o male with h/o COPD, DVT, HLD, GERD, pulmonary nodule and DM who is admitted with RSV, pneumonia and COPD exacerbation.  1/27- extubated  1/29- s/p BSE- advanced to dysphagia 1 diet with thin liquids 2/3- NGT placed, TF started 2/9- s/p BSE- advanced to dysphagia 2 diet with thin liquids 2/10- s/p BSE- remain on dysphagia 2 diet with thin liquids 2/13- NGT removed  Reviewed I/O's: -46 ml x 24 hours and -5.5 L since 10/03/23  UOP: 550 ml x 24 hours   Spoke with pt at bedside, who arouse to voice. Pt with hoarse voice, but able to communicate needs. Pt reports feeling a little better today, but reports he has pain and just requested medication from RN.   Pt shares that he is happy to have NGT out and now feels more comfortable. He shares that he is motivated to eat, but breakfast has not yet arrived. Pt consumed 25% of dinner last night. Noted two Ensure supplements that were drank by pt. Pt reports he likes supplements and is willing to continue drinking them. Discussed importance of good meal  and supplement intake to promote healing.   Palliative care re-visited pt for goals of care discussions. Pt no longer wants PEG and NGT was removed yesterday. Plan to hold off on IR consult. Sister also desires to see how pt does with PO intake and remains hopeful for improvement.   Medications reviewed and include vitamin C, diflucan, protonix, miralax, and prednisone.   Labs reviewed: CBGS: 61-239 (inpatient orders for glycemic control are 0-9 units insulin aspart every 4 hours).    Diet Order:   Diet Order             DIET DYS 2 Room service appropriate? Yes with Assist; Fluid consistency: Thin  Diet effective now                  EDUCATION NEEDS:   Education needs have been addressed  Skin:  Skin Assessment: Skin Integrity Issues: Skin Integrity Issues:: Unstageable Stage II: - Unstageable: sacrum Other: IAD to buttocks  Last BM:  10/11/23  Height:   Ht Readings from Last 1 Encounters:  09/10/23 6' (1.829 m)    Weight:   Wt Readings from Last 1 Encounters:  10/17/23 51.3 kg    Ideal Body Weight:  80.9 kg  BMI:  Body mass index is 15.34 kg/m.  Estimated Nutritional Needs:   Kcal:  1900-2200kcal/day  Protein:  95-110g/day  Fluid:  1.8-2.1L/day    Levada Schilling, RD, LDN, CDCES Registered Dietitian III Certified Diabetes Care and Education Specialist If unable to reach this  RD, please use "RD Inpatient" group chat on secure chat between hours of 8am-4 pm daily

## 2023-10-17 NOTE — Progress Notes (Addendum)
 Progress Note   [name] RUE:[id number] DOB: Jul 12, 1961 DOA: 09/10/2023     37 DOS: the patient was seen and examined on 10/17/2023   Brief hospital course: ICU transfer for 10/08/2023.  Taken from prior notes.  [name] is a 63 y.o. male with medical history significant of COPD on 3L O2, HLD, pre-DM, DVT on Eliquis, who presents with SOB.  admitted with Acute on Chronic Hypoxic and Hypercapnic Respiratory Failure in the setting of Acute COPD Exacerbation due to RSV Pneumonia requiring intubation and mechanical ventilation on 09/13/2023.  Was successfully extubated on 1/27, currently on his baseline 3L nasal cannula, not requiring vasopressors.  Hospital course has been complicated questionable HAP and metabolic encephalopathy, which has limited his ability to participate with PT.  While he was Lucid after extubation, did make himself DNR/DNI in presence of family and chaplain.  He will remain full scope of medical care.  . Patient is chronically maintained on 20 mg of prednisone which will continue for now but will require a prolonged taper. He is refusing certain aspects of his care which is challenging given his overall comorbidities.   Initial PT recommendations for CIR which were downgraded to SNF due to his ability to participate with physical therapy.  Might need LTAC placement.  Echocardiogram done on 09/14/2023 with low normal EF and indeterminate diastolic parameters.  Troponin peaked at 115.  Did receive pressors intermittently while in ICU.  Cardiology was consulted which later signed off and no further cardiac evaluation indicated at this time.  There was a questionable superimposed HAP which was treated with antibiotics.  Multiple electrolyte abnormalities which include mild hyperkalemia, hyponatremia and hypophosphatemia which were resolved with repletion.  2/5: Vital stable with borderline soft blood pressure, saturating 100% on 2 L of oxygen, temperature of  100.4 recorded over the past 24-hour.  Labs with slight worsening of leukocytosis at 12.5, rest of the labs stable, CRP improving, CBG elevated. Sputum cultures from 1/22 with normal respiratory flora.  Patient is currently on Zosyn. Very muffled voice and weak cough.  Patient is quite debilitated and is appropriate for LTAC as recommended and PCCM note. Patient is on tube feed through NGT as having postintubation dysphagia.  2/6: Hemodynamically and labs seems stable, mild hypophosphatemia at 2.4.  Anemia panel consistent with anemia of chronic disease with low iron and saturation, ferritin elevated.  Had a bed offer at select LTAC, pending insurance authorization.  Patient is reluctant for PEG tube, stating that improvement in dysphagia and he will like to try more p.o. intake. Small improvement in voice and cough reflex.  Completed the course of Zosyn.  2/7: Hemodynamically stable, slowly improving voice and cough.  Improving CRP.  Pending insurance authorization for LTAC.  Still does not want PEG tube.  2/8: Oxycodone added at his request due to complaint of generalized bodyaches.  Worsening cough and leukocytosis today, repeating chest x-ray.  Ordered a repeat swallow evaluation. Patient just completed course of Zosyn.  2/9: Overnight quite agitated and unable to sleep.  Ativan helped.  Complaining of generalized aches and pain, appears to have very poor insight of his condition.  Labs with mild pseudohyponatremia secondary to hyperglycemia.  Per patient no appetite but p.o. intake slowly improving. Repeat chest x-ray with persistent but improving bilateral pneumonia. CRP again started trending up.  Continue to have some diarrhea, ordered CBC as add-on  2/10: LTAC placement was declined by insurance even with peer to peer review.  They want a definitive  feeding plan in place.  Patient with poor appetite but able to swallow now.  We will try giving him a break from NG tube, he was on a continuous  field.  Might pull the NG tube out and see if he can take adequate p.o. intake.  If he continued to need tube feeding then PEG will be the best option but patient does not want it.  2/11: Patient became febrile again overnight, maximum temperature recorded of 102.  CRP again started trending up, slight worsening of leukocytosis, repeat chest x-ray with increasing infiltrate.  Patient recently received multiple courses of antibiotic.  UA does not look infected.  Infectious disease was also consulted.  Blood, sputum and wound cultures ordered.  Starting on Zosyn Calorie count with p.o. intake started, tube feeding switched to bolus if he is unable to eat p.o. Appeal was made for [facility] decision.  2/12: Maximum temperature recorded over the past 24 hours was 100.2, remained tachycardic, worsening leukocytosis at 14.4, and CRP.  CT chest abdomen and pelvis with continued consolidation in lower lobes bilaterally concerning for pneumonia.  Mild gaseous distention of colon, may reflect mild ileus, repeat respiratory viral panel negative, cultures are pending. Patient agrees to PEG placement, IR was consulted and they are would like to wait until current fever and concern of infection is sorted out.  Patient also need 48 hours of Eliquis break before placement. LTAC appeal still pending  Patient is high risk for worsening comorbidities and mortality, palliative care was consulted.  2/13: Patient remained febrile, NG tube was removed today so he can try improving his p.o. intake, patient will not decide about PEG tube after p.o. challenge.  ID to decide about whether to add further antibiotics, worsening inflammatory markers.  Patient still not ready for hospice.  2/14 d/w pt and he agreed for PEG tube insertion, d/w POA sister and she agreed as well. Texted to IR to find out when we can schedule PEG tube insertion and how many days we need to hold Eliquis?  Awaiting for reply.   Assessment and Plan: *  Acute on chronic respiratory failure with hypoxia (HCC) COPD exacerbation-treated and resolved RSV pneumonia-treated Concern of superimposed HAP. S/p prolonged intubation with advanced underlying COPD. Currently saturating well on 2 L of oxygen which is his baseline. Postintubation muffled voice and weak cough. Currently on Zosyn for for concern of HAP although tracheal cultures with normal respiratory flora.  Inflammatory markers seems improving. -Completed the course of Zosyn initially but it was restarted yesterday on 2/11 with the development of fever -Continue supplemental oxygen -Incentive spirometry and flutter valve -Chest PT -Continue with supportive care -Repeat swallow evaluation due to worsening cough -Repeat chest x-ray-with persistent bilateral pneumonia  Fever Patient became febrile again with worsening leukocytosis and CRP.  Repeat chest x-ray today with worsening infiltrate, ordered repeat blood, sputum and wound cultures.  Diarrhea has been resolved.  UA does not look infected.  Preliminary blood cultures negative, sputum cultures pending.  Legionella and TB QuantiFERON was also sent by ID CT chest, abdomen and pelvis with concern of bilateral basal opacities/pneumonia. ID consult Follow-up cultures Starting on Zosyn  COPD exacerbation (HCC) -Continue with prednisone taper  DVT (deep venous thrombosis) (HCC) Patient has history of DVT. -Continue Eliquis  Protein-calorie malnutrition, severe (HCC)  Swallowing much improved. Started calorie count as p.o. intake slowly improving, appetite remained poor and unable to meet his caloric requirement, losing weight.   NGT was removed today, IR was consulted  yesterday for PEG tube placement when he agreed, today he again saying that does not want PEG tube and wants to give himself a p.o. challenge. -Continue to monitor 2/14 agreed for PEG tube placement  Electrolyte abnormality Patient had multiple electrolyte  abnormalities during this complicated course of hospitalization which involved mild hyperkalemia, hyponatremia and hypophosphatemia which has been improved. -Continue to monitor-replete as needed  Normochromic anemia Hemoglobin currently stable at 8.7.  Likely due to chronic disease. Anemia panel consistent with anemia of chronic disease -Monitor hemoglobin -Transfuse if below 7  Pre-diabetes A1c of 6.4, CBG currently elevated. Patient is on tube feed and steroid. -Continue with SSI  Elevated troponin Echocardiogram done on 09/14/2023 with 50 to 55% EF, indeterminate diastolic parameter.  Likely secondary to demand ischemia.  Troponin peaked at 115 Initially cardiology was consulted and later the signed off, no intervention required.  Diarrhea Improved.  No abdominal pain but to have mild leukocytosis. High risk for C. Difficile. -Continue with supportive care      Subjective:  No significant overnight events, patient was resting only.  Still have hoarseness of voice and dysphagia. Discussed importance of PEG tube, he agreed for PEG tube placement. Patient stated to communicate with his sister who is POA. I called and discussed with patient's sister as well   Physical Exam: Vitals:   10/16/23 2300 10/17/23 0500 10/17/23 0832 10/17/23 1100  BP: 113/78 98/65 103/60 93/70  Pulse: 98 100 (!) 109 97  Resp:      Temp:  98.2 F (36.8 C) 98.4 F (36.9 C) 99.5 F (37.5 C)  TempSrc:  Oral Oral Oral  SpO2: 100%  92% 94%  Weight:  51.3 kg    Height:       General.  Frail and severely malnourished gentleman, in no acute distress. Pulmonary.  Lungs clear bilaterally, normal respiratory effort. CV.  Regular rate and rhythm, no JVD, rub or murmur. Abdomen.  Soft, nontender, nondistended, BS positive. CNS.  Alert and oriented .  No focal neurologic deficit. Extremities.  No edema, no cyanosis, pulses intact and symmetrical.  Data Reviewed: Prior data reviewed  Family  Communication:   Disposition: Status is: Inpatient Remains inpatient appropriate because: Severity of illness  Planned Discharge Destination: LTAC  DVT prophylaxis.  Eliquis ( held am dose 2/14) Time spent: 50 minutes  This record has been created using Conservation officer, historic buildings. Errors have been sought and corrected,but may not always be located. Such creation errors do not reflect on the standard of care.   Author: Gillis Santa, MD 10/17/2023 4:06 PM  For on call review [email or web address].

## 2023-10-17 NOTE — Progress Notes (Signed)
Occupational Therapy Treatment Patient Details Name: Benjamin Bolton MRN: [id number] DOB: April 11, 1961 Today's Date: 10/17/2023   History of present illness 63 y.o. 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   OT comments  Pt is supine in bed on arrival. Wife and another family member present. Wife combing his hair. Max encouragement needed to participate including from wife and other family member present. Pt became agitated and was cussing them, but finally agreed to work with PT/OT for co-tx session. No reports of pain this session. He performed bed mobility with SUP and x2 STS trials from EOB at lowest height with Min A x2 via HHA. Grooming tasks performed seated with set up and able to stand to rinse mouth at sink via 1-2 forward steps with Min A x2. Lateral scooted to [facility] with SUP before returning to supine. Edu on importance of continuing to work with therapy to progress his strength and mobility. Sp02 WFL throughout session with HR up to 122 with activity. Pt returned to bed with all needs in place and will cont to require skilled acute OT services to maximize his safety and IND to return to PLOF.       If plan is discharge home, recommend the following:  Help with stairs or ramp for entrance;Assistance with cooking/housework;Assist for transportation;Assistance with feeding;A little help with walking and/or transfers;A lot of help with walking and/or transfers;A little help with bathing/dressing/bathroom   Equipment Recommendations  Other (comment) (defer)    Recommendations for Other Services      Precautions / Restrictions Precautions Precautions: Fall Restrictions Weight Bearing Restrictions Per Provider Order: No       Mobility Bed Mobility Overal bed  mobility: Needs Assistance   Rolling: Supervision   Supine to sit: Supervision     General bed mobility comments: SUP, increased time and motivation-no physical assist needed; lateral scoots to [facility] with SUP    Transfers Overall transfer level: Needs assistance Equipment used: Rolling walker (2 wheels) Transfers: Sit to/from Stand Sit to Stand: Min assist, +2 safety/equipment           General transfer comment: Min A x2 for STS via HHA to stand x2 trials from EOB at lowest height; able to take 1-2 forward steps to spit in sink for oral care     Balance Overall balance assessment: Needs assistance Sitting-balance support: Feet supported Sitting balance-Leahy Scale: Good Sitting balance - Comments: SUP for seated balance at EOB   Standing balance support: Bilateral upper extremity supported Standing balance-Leahy Scale: Poor Standing balance comment: BUE support via HHA and brief uniltaral on sink for oral care with Min A x2                           ADL either performed or assessed with clinical judgement   ADL Overall ADL's : Needs assistance/impaired     Grooming: Oral care;Wash/dry face;Sitting;Set up;Standing Grooming Details (indicate cue type and reason): set up assist to wash face seated EOB and begin oral care, able to stand at sink to rinse and spit         Upper Body Dressing : Minimal assistance;Sitting Upper Body Dressing Details (indicate cue type and reason): to doff/don gown and change chux pads to manage Tele and 02 cord  Extremity/Trunk Assessment              Vision       Restaurant manager, fast food Communication:  (low volume)   Cognition Arousal: Alert Behavior During Therapy: WFL for tasks assessed/performed               OT - Cognition Comments: per family his cognition seems back to normal, but he tries to refuse and does become agitated and cussed at his  wife when she tried to convince him to get OOB                 Following commands: Impaired Following commands impaired: Follows one step commands with increased time      Cueing   Cueing Techniques: Verbal cues, Tactile cues  Exercises      Shoulder Instructions       General Comments agitated and initially refusing, but finally gave in and agreed    Pertinent Vitals/ Pain       Pain Assessment Pain Assessment: Faces Faces Pain Scale: No hurt Pain Location: no pain mentioned during today's session Pain Intervention(s): Monitored during session, Repositioned  Home Living                                          Prior Functioning/Environment              Frequency  Min 1X/week        Progress Toward Goals  OT Goals(current goals can now be found in the care plan section)  Progress towards OT goals: Progressing toward goals  Acute Rehab OT Goals Patient Stated Goal: return home OT Goal Formulation: With patient Time For Goal Achievement: 10/29/23 Potential to Achieve Goals: Fair  Plan      Co-evaluation    PT/OT/SLP Co-Evaluation/Treatment: Yes Reason for Co-Treatment: For patient/therapist safety;To address functional/ADL transfers PT goals addressed during session: Mobility/safety with mobility OT goals addressed during session: ADL's and self-care      AM-PAC OT "6 Clicks" Daily Activity     Outcome Measure   Help from another person eating meals?: A Little Help from another person taking care of personal grooming?: A Little Help from another person toileting, which includes using toliet, bedpan, or urinal?: A Lot Help from another person bathing (including washing, rinsing, drying)?: A Lot Help from another person to put on and taking off regular upper body clothing?: A Little Help from another person to put on and taking off regular lower body clothing?: A Lot 6 Click Score: 15    End of Session Equipment Utilized  During Treatment: Oxygen  OT Visit Diagnosis: Other abnormalities of gait and mobility (R26.89);Muscle weakness (generalized) (M62.81)   Activity Tolerance Patient tolerated treatment well   Patient Left in bed;with call bell/phone within reach;with family/visitor present;with bed alarm set   Nurse Communication Mobility status        Time: 8657-8469 OT Time Calculation (min): 31 min  Charges: OT General Charges $OT Visit: 1 Visit OT Treatments $Self Care/Home Management : 8-22 mins  Brystol Wasilewski, OTR/L  10/17/23, 3:45 PM   Xaviera Flaten E Cebastian Neis 10/17/2023, 3:42 PM

## 2023-10-17 NOTE — Plan of Care (Signed)
Problem: Education: Goal: Knowledge of General Education information will improve Description: Including pain rating scale, medication(s)/side effects and non-pharmacologic comfort measures Outcome: Progressing   Problem: Health Behavior/Discharge Planning: Goal: Ability to manage health-related needs will improve Outcome: Progressing   Problem: Clinical Measurements: Goal: Ability to maintain clinical measurements within normal limits will improve Outcome: Progressing Goal: Will remain free from infection Outcome: Progressing Goal: Diagnostic test results will improve Outcome: Progressing Goal: Respiratory complications will improve Outcome: Progressing Goal: Cardiovascular complication will be avoided Outcome: Progressing   Problem: Activity: Goal: Risk for activity intolerance will decrease Outcome: Progressing   Problem: Nutrition: Goal: Adequate nutrition will be maintained Outcome: Progressing   Problem: Coping: Goal: Level of anxiety will decrease Outcome: Progressing   Problem: Elimination: Goal: Will not experience complications related to bowel motility Outcome: Progressing Goal: Will not experience complications related to urinary retention Outcome: Progressing   Problem: Pain Management: Goal: General experience of comfort will improve Outcome: Progressing   Problem: Safety: Goal: Ability to remain free from injury will improve Outcome: Progressing   Problem: Skin Integrity: Goal: Risk for impaired skin integrity will decrease Outcome: Progressing   Problem: Education: Goal: Knowledge of disease or condition will improve Outcome: Progressing Goal: Knowledge of the prescribed therapeutic regimen will improve Outcome: Progressing   Problem: Activity: Goal: Ability to tolerate increased activity will improve Outcome: Progressing Goal: Will verbalize the importance of balancing activity with adequate rest periods Outcome: Progressing   Problem:  Respiratory: Goal: Ability to maintain a clear airway will improve Outcome: Progressing Goal: Levels of oxygenation will improve Outcome: Progressing Goal: Ability to maintain adequate ventilation will improve Outcome: Progressing   Problem: Education: Goal: Ability to describe self-care measures that may prevent or decrease complications (Diabetes Survival Skills Education) will improve Outcome: Progressing   Problem: Coping: Goal: Ability to adjust to condition or change in health will improve Outcome: Progressing   Problem: Fluid Volume: Goal: Ability to maintain a balanced intake and output will improve Outcome: Progressing   Problem: Health Behavior/Discharge Planning: Goal: Ability to identify and utilize available resources and services will improve Outcome: Progressing Goal: Ability to manage health-related needs will improve Outcome: Progressing   Problem: Metabolic: Goal: Ability to maintain appropriate glucose levels will improve Outcome: Progressing   Problem: Nutritional: Goal: Maintenance of adequate nutrition will improve Outcome: Progressing Goal: Progress toward achieving an optimal weight will improve Outcome: Progressing   Problem: Skin Integrity: Goal: Risk for impaired skin integrity will decrease Outcome: Progressing   Problem: Tissue Perfusion: Goal: Adequacy of tissue perfusion will improve Outcome: Progressing   Problem: Activity: Goal: Ability to tolerate increased activity will improve Outcome: Progressing   Problem: Respiratory: Goal: Ability to maintain a clear airway and adequate ventilation will improve Outcome: Progressing   Problem: Role Relationship: Goal: Method of communication will improve Outcome: Progressing

## 2023-10-18 DIAGNOSIS — J9621 Acute and chronic respiratory failure with hypoxia: Secondary | ICD-10-CM | POA: Diagnosis not present

## 2023-10-18 LAB — BASIC METABOLIC PANEL
Anion gap: 9 (ref 5–15)
BUN: 21 mg/dL (ref 8–23)
CO2: 33 mmol/L — ABNORMAL HIGH (ref 22–32)
Calcium: 9 mg/dL (ref 8.9–10.3)
Chloride: 90 mmol/L — ABNORMAL LOW (ref 98–111)
Creatinine, Ser: 0.66 mg/dL (ref 0.61–1.24)
GFR, Estimated: >60 mL/min (ref >60)
Glucose, Bld: 290 mg/dL — ABNORMAL HIGH (ref 70–99)
Potassium: 3.7 mmol/L (ref 3.5–5.1)
Sodium: 132 mmol/L — ABNORMAL LOW (ref 135–145)

## 2023-10-18 LAB — PHOSPHORUS: Phosphorus: 2.9 mg/dL (ref 2.5–4.6)

## 2023-10-18 LAB — CBC
HCT: 25.8 % — ABNORMAL LOW (ref 39.0–52.0)
Hemoglobin: 8.4 g/dL — ABNORMAL LOW (ref 13.0–17.0)
MCH: 29.5 pg (ref 26.0–34.0)
MCHC: 32.6 g/dL (ref 30.0–36.0)
MCV: 90.5 fL (ref 80.0–100.0)
Platelets: 355 10*3/uL (ref 150–400)
RBC: 2.85 MIL/uL — ABNORMAL LOW (ref 4.22–5.81)
RDW: 15 % (ref 11.5–15.5)
WBC: 10.6 10*3/uL — ABNORMAL HIGH (ref 4.0–10.5)
nRBC: 0.3 % — ABNORMAL HIGH (ref 0.0–0.2)

## 2023-10-18 LAB — GLUCOSE, CAPILLARY
Glucose-Capillary: 177 mg/dL — ABNORMAL HIGH (ref 70–99)
Glucose-Capillary: 152 mg/dL — ABNORMAL HIGH (ref 70–99)
Glucose-Capillary: 221 mg/dL — ABNORMAL HIGH (ref 70–99)
Glucose-Capillary: 142 mg/dL — ABNORMAL HIGH (ref 70–99)

## 2023-10-18 LAB — OSMOLALITY: Osmolality: 293 mosm/kg (ref 275–295)

## 2023-10-18 LAB — VITAMIN D 25 HYDROXY (VIT D DEFICIENCY, FRACTURES): Vit D, 25-Hydroxy: 37.44 ng/mL (ref 30–100)

## 2023-10-18 LAB — C-REACTIVE PROTEIN: CRP: 7.5 mg/dL — ABNORMAL HIGH (ref <1.0)

## 2023-10-18 LAB — MAGNESIUM: Magnesium: 2.2 mg/dL (ref 1.7–2.4)

## 2023-10-18 MED ORDER — METOPROLOL SUCCINATE ER 25 MG PO TB24: 12.5000 mg | ORAL_TABLET | Freq: Every day | ORAL | Status: DC

## 2023-10-18 MED ORDER — POLYSACCHARIDE IRON COMPLEX 150 MG PO CAPS
150.0000 mg | ORAL_CAPSULE | Freq: Every day | ORAL | Status: DC
  Administered 2023-10-18 – 2023-10-29 (×11): 150 mg via ORAL
  Filled 2023-10-18 (×12): qty 1

## 2023-10-18 MED ORDER — MIDODRINE HCL 5 MG PO TABS
5.0000 mg | ORAL_TABLET | Freq: Three times a day (TID) | ORAL | Status: DC
Start: 2023-10-18 — End: 2023-10-29
  Administered 2023-10-18 – 2023-10-29 (×32): 5 mg via ORAL
  Filled 2023-10-18 (×32): qty 1

## 2023-10-18 MED ORDER — ENOXAPARIN SODIUM 60 MG/0.6ML IJ SOSY
1.0000 mg/kg | PREFILLED_SYRINGE | Freq: Two times a day (BID) | INTRAMUSCULAR | Status: AC
  Administered 2023-10-18 – 2023-10-19 (×3): 52.5 mg via SUBCUTANEOUS
  Filled 2023-10-18 (×3): qty 0.6

## 2023-10-18 NOTE — Plan of Care (Signed)
   Problem: Education: Goal: Knowledge of General Education information will improve Description: Including pain rating scale, medication(s)/side effects and non-pharmacologic comfort measures Outcome: Progressing   Problem: Health Behavior/Discharge Planning: Goal: Ability to manage health-related needs will improve Outcome: Progressing   Problem: Clinical Measurements: Goal: Ability to maintain clinical measurements within normal limits will improve Outcome: Progressing Goal: Will remain free from infection Outcome: Progressing Goal: Diagnostic test results will improve Outcome: Progressing Goal: Respiratory complications will improve Outcome: Progressing Goal: Cardiovascular complication will be avoided Outcome: Progressing   Problem: Activity: Goal: Risk for activity intolerance will decrease Outcome: Progressing   Problem: Nutrition: Goal: Adequate nutrition will be maintained Outcome: Progressing   Problem: Coping: Goal: Level of anxiety will decrease Outcome: Progressing   Problem: Elimination: Goal: Will not experience complications related to bowel motility Outcome: Progressing Goal: Will not experience complications related to urinary retention Outcome: Progressing   Problem: Pain Management: Goal: General experience of comfort will improve Outcome: Progressing   Problem: Safety: Goal: Ability to remain free from injury will improve Outcome: Progressing   Problem: Skin Integrity: Goal: Risk for impaired skin integrity will decrease Outcome: Progressing   Problem: Education: Goal: Knowledge of disease or condition will improve Outcome: Progressing Goal: Knowledge of the prescribed therapeutic regimen will improve Outcome: Progressing   Problem: Activity: Goal: Ability to tolerate increased activity will improve Outcome: Progressing Goal: Will verbalize the importance of balancing activity with adequate rest periods Outcome: Progressing   Problem:  Respiratory: Goal: Ability to maintain a clear airway will improve Outcome: Progressing Goal: Levels of oxygenation will improve Outcome: Progressing Goal: Ability to maintain adequate ventilation will improve Outcome: Progressing   Problem: Education: Goal: Ability to describe self-care measures that may prevent or decrease complications (Diabetes Survival Skills Education) will improve Outcome: Progressing   Problem: Coping: Goal: Ability to adjust to condition or change in health will improve Outcome: Progressing   Problem: Fluid Volume: Goal: Ability to maintain a balanced intake and output will improve Outcome: Progressing   Problem: Health Behavior/Discharge Planning: Goal: Ability to identify and utilize available resources and services will improve Outcome: Progressing Goal: Ability to manage health-related needs will improve Outcome: Progressing   Problem: Metabolic: Goal: Ability to maintain appropriate glucose levels will improve Outcome: Progressing   Problem: Nutritional: Goal: Maintenance of adequate nutrition will improve Outcome: Progressing Goal: Progress toward achieving an optimal weight will improve Outcome: Progressing   Problem: Skin Integrity: Goal: Risk for impaired skin integrity will decrease Outcome: Progressing   Problem: Tissue Perfusion: Goal: Adequacy of tissue perfusion will improve Outcome: Progressing   Problem: Activity: Goal: Ability to tolerate increased activity will improve Outcome: Progressing   Problem: Respiratory: Goal: Ability to maintain a clear airway and adequate ventilation will improve Outcome: Progressing   Problem: Role Relationship: Goal: Method of communication will improve Outcome: Progressing

## 2023-10-18 NOTE — Progress Notes (Signed)
 Progress Note   [name] [id number] DOB: 07-01-1961 DOA: 09/10/2023     38 DOS: the patient was seen and examined on 10/18/2023   Brief hospital course: ICU transfer for 10/08/2023.  Taken from prior notes.  Benjamin Bolton is a 63 y.o. male with medical history significant of COPD on 3L O2, HLD, pre-DM, DVT on Eliquis, who presents with SOB.  admitted with Acute on Chronic Hypoxic and Hypercapnic Respiratory Failure in the setting of Acute COPD Exacerbation due to RSV Pneumonia requiring intubation and mechanical ventilation on 09/13/2023.  Was successfully extubated on 1/27, currently on his baseline 3L nasal cannula, not requiring vasopressors.  Hospital course has been complicated questionable HAP and metabolic encephalopathy, which has limited his ability to participate with PT.  While he was Lucid after extubation, did make himself DNR/DNI in presence of family and chaplain.  He will remain full scope of medical care.  . Patient is chronically maintained on 20 mg of prednisone which will continue for now but will require a prolonged taper. He is refusing certain aspects of his care which is challenging given his overall comorbidities.   Initial PT recommendations for CIR which were downgraded to SNF due to his ability to participate with physical therapy.  Might need LTAC placement.  Echocardiogram done on 09/14/2023 with low normal EF and indeterminate diastolic parameters.  Troponin peaked at 115.  Did receive pressors intermittently while in ICU.  Cardiology was consulted which later signed off and no further cardiac evaluation indicated at this time.  There was a questionable superimposed HAP which was treated with antibiotics.  Multiple electrolyte abnormalities which include mild hyperkalemia, hyponatremia and hypophosphatemia which were resolved with repletion.  2/5: Vital stable with borderline soft blood pressure, saturating 100% on 2 L of oxygen, temperature of  100.4 recorded over the past 24-hour.  Labs with slight worsening of leukocytosis at 12.5, rest of the labs stable, CRP improving, CBG elevated. Sputum cultures from 1/22 with normal respiratory flora.  Patient is currently on Zosyn. Very muffled voice and weak cough.  Patient is quite debilitated and is appropriate for LTAC as recommended and PCCM note. Patient is on tube feed through NGT as having postintubation dysphagia.  2/6: Hemodynamically and labs seems stable, mild hypophosphatemia at 2.4.  Anemia panel consistent with anemia of chronic disease with low iron and saturation, ferritin elevated.  [facility] at [facility], pending insurance authorization.  Patient is reluctant for PEG tube, stating that improvement in dysphagia and he will like to try more p.o. intake. Small improvement in voice and cough reflex.  Completed the course of Zosyn.  2/7: Hemodynamically stable, slowly improving voice and cough.  Improving CRP.  Pending insurance authorization for LTAC.  Still does not want PEG tube.  2/8: Oxycodone added at his request due to complaint of generalized bodyaches.  Worsening cough and leukocytosis today, repeating chest x-ray.  Ordered a repeat swallow evaluation. Patient just completed course of Zosyn.  2/9: Overnight quite agitated and unable to sleep.  Ativan helped.  Complaining of generalized aches and pain, appears to have very poor insight of his condition.  Labs with mild pseudohyponatremia secondary to hyperglycemia.  Per patient no appetite but p.o. intake slowly improving. Repeat chest x-ray with persistent but improving bilateral pneumonia. CRP again started trending up.  Continue to have some diarrhea, ordered CBC as add-on  2/10: LTAC placement was declined by insurance even with peer to peer review.  They want a definitive  feeding plan in place.  Patient with poor appetite but able to swallow now.  We will try giving him a break from NG tube, he was on a continuous  field.  Might pull the NG tube out and see if he can take adequate p.o. intake.  If he continued to need tube feeding then PEG will be the best option but patient does not want it.  2/11: Patient became febrile again overnight, maximum temperature recorded of 102.  CRP again started trending up, slight worsening of leukocytosis, repeat chest x-ray with increasing infiltrate.  Patient recently received multiple courses of antibiotic.  UA does not look infected.  Infectious disease was also consulted.  Blood, sputum and wound cultures ordered.  Starting on Zosyn Calorie count with p.o. intake started, tube feeding switched to bolus if he is unable to eat p.o. Appeal was made for [facility] decision.  2/12: Maximum temperature recorded over the past 24 hours was 100.2, remained tachycardic, worsening leukocytosis at 14.4, and CRP.  CT chest abdomen and pelvis with continued consolidation in lower lobes bilaterally concerning for pneumonia.  Mild gaseous distention of colon, may reflect mild ileus, repeat respiratory viral panel negative, cultures are pending. Patient agrees to PEG placement, IR was consulted and they are would like to wait until current fever and concern of infection is sorted out.  Patient also need 48 hours of Eliquis break before placement. LTAC appeal still pending  Patient is high risk for worsening comorbidities and mortality, palliative care was consulted.  2/13: Patient remained febrile, NG tube was removed today so he can try improving his p.o. intake, patient will not decide about PEG tube after p.o. challenge.  ID to decide about whether to add further antibiotics, worsening inflammatory markers.  Patient still not ready for hospice.  2/14 d/w pt and he agreed for PEG tube insertion, d/w POA sister and she agreed as well. Texted to IR to find out when we can schedule PEG tube insertion and how many days we need to hold Eliquis?  Awaiting for reply.   Assessment and Plan:  #  Acute on chronic respiratory failure with hypoxia (HCC) # COPD exacerbation-treated and resolved # RSV pneumonia-treated # Concern of superimposed HAP. S/p prolonged intubation with advanced underlying COPD. Currently saturating well on 2 L of oxygen which is his baseline. Postintubation muffled voice and weak cough. S/p Zosyn till 2/11 for concern of HAP although tracheal cultures with normal respiratory flora.  Inflammatory markers seems improving. -Continue supplemental oxygen -Incentive spirometry and flutter valve -Chest PT -Continue with supportive care -Repeat swallow evaluation due to worsening cough -Repeat chest x-ray-with persistent bilateral pneumonia  Fever Tam 101 on 2/12 Patient became febrile again with worsening leukocytosis and CRP.  Repeat chest x-ray with worsening infiltrate, ordered repeat blood, sputum and wound cultures.  Diarrhea has been resolved.  UA does not look infected.  Preliminary blood cultures negative, sputum cultures pending.  Legionella and TB QuantiFERON was also sent by ID CT chest, abdomen and pelvis with concern of bilateral basal opacities/pneumonia. ID consulted, s/p Zosyn, rec to follow off antibiotics. 2/11 blood culture NGTD, 2/11 RVP negative, 2/12 COVID-negative 2/12 RSV positive  # Suspected candidal esophagitis as per ID Started Diflucan 20 mg p.o. daily, continue for 2 weeks   # COPD exacerbation, resolved Patient is on prednisone 20 mg chronically which has been continued  # DVT (deep venous thrombosis) (HCC) Patient has history of DVT. 2/14 held Eliquis, started Lovenox for now due to  PEG tube placement on Monday   # Protein-calorie malnutrition, severe (HCC)  Swallowing much improved. Started calorie count as p.o. intake slowly improving, appetite remained poor and unable to meet his caloric requirement, losing weight.   NGT was removed today, IR was consulted yesterday for PEG tube placement when he agreed, today he again  saying that does not want PEG tube and wants to give himself a p.o. challenge. -Continue to monitor 2/14 agreed for PEG tube placement 2/14 held Eliquis, started Lovenox for now due to PEG tube placement on Monday IR consulted to keep him on his schedule for PEG tube on Monday  Hypotension, started midodrine 5 mg p.o. 3 times daily with holding parameters on 2/15   Electrolyte abnormality Patient had multiple electrolyte abnormalities during this complicated course of hospitalization which involved mild hyperkalemia, hyponatremia and hypophosphatemia which has been improved. -Continue to monitor-replete as needed  Normochromic anemia Hemoglobin currently stable at 8.7.  Likely due to chronic disease. Anemia panel consistent with anemia of chronic disease -Monitor hemoglobin -Transfuse if below 7  Pre-diabetes A1c of 6.4, CBG currently elevated. Patient is on tube feed and steroid. -Continue with SSI  Elevated troponin Echocardiogram done on 09/14/2023 with 50 to 55% EF, indeterminate diastolic parameter.  Likely secondary to demand ischemia.  Troponin peaked at 115 Initially cardiology was consulted and later the signed off, no intervention required.  Diarrhea Improved.  No abdominal pain but to have mild leukocytosis. High risk for C. Difficile. -Continue with supportive care  # Iron deficiency, transferrin saturation 13%, started oral iron supplement with vitamin C.    Nutrition Documentation    Flowsheet Row ED to Hosp-Admission (Current) from 09/10/2023 in [facility]  Nutrition Problem Severe Malnutrition  Etiology acute illness  Nutrition Goal Patient will meet greater than or equal to 90% of their needs  Interventions Refer to RD note for recommendations     ,  Active Pressure Injury/Wound(s)     Pressure Ulcer  Duration          Pressure Injury 10/08/23 Sacrum Mid Unstageable - Full thickness tissue loss in which the base of the injury is  covered by slough (yellow, tan, gray, green or brown) and/or eschar (tan, brown or black) in the wound bed. 10 days             Subjective:  No significant overnight events, patient was lying in the bed comfortably, stated that he has not ambulated yet, having some difficulty walking.  Patient agreed for PEG tube placement on Monday.  Denied any other complaints.   Physical Exam: Vitals:   10/18/23 0500 10/18/23 0503 10/18/23 0952 10/18/23 1214  BP: 101/66     Pulse: 99     Resp: 16     Temp:   98.5 F (36.9 C) 99.1 F (37.3 C)  TempSrc:   Oral Oral  SpO2: 98%  97% 100%  Weight:  53 kg    Height:       General.  Frail and severely malnourished gentleman, in no acute distress. Pulmonary.  Lungs clear bilaterally, normal respiratory effort. CV.  Regular rate and rhythm, no JVD, rub or murmur. Abdomen.  Soft, nontender, nondistended, BS positive. CNS.  Alert and oriented .  No focal neurologic deficit. Extremities.  No edema, no cyanosis, pulses intact and symmetrical.  Data Reviewed: Prior data reviewed  Family Communication:   Disposition: Status is: Inpatient Remains inpatient appropriate because: Severity of illness  Planned Discharge Destination:  LTAC  DVT prophylaxis.  Eliquis ( held am dose 2/14) on Lovenox for now Time spent: 50 minutes  This record has been created using Conservation officer, historic buildings. Errors have been sought and corrected,but may not always be located. Such creation errors do not reflect on the standard of care.   Author: Gillis Santa, MD 10/18/2023 2:11 PM  For on call review [email or web address].

## 2023-10-19 DIAGNOSIS — J9621 Acute and chronic respiratory failure with hypoxia: Secondary | ICD-10-CM | POA: Diagnosis not present

## 2023-10-19 LAB — BASIC METABOLIC PANEL
Anion gap: 11 (ref 5–15)
BUN: 15 mg/dL (ref 8–23)
CO2: 31 mmol/L (ref 22–32)
Calcium: 9.2 mg/dL (ref 8.9–10.3)
Chloride: 93 mmol/L — ABNORMAL LOW (ref 98–111)
Creatinine, Ser: 0.56 mg/dL — ABNORMAL LOW (ref 0.61–1.24)
GFR, Estimated: >60 mL/min (ref >60)
Glucose, Bld: 117 mg/dL — ABNORMAL HIGH (ref 70–99)
Potassium: 3.6 mmol/L (ref 3.5–5.1)
Sodium: 135 mmol/L (ref 135–145)

## 2023-10-19 LAB — MAGNESIUM: Magnesium: 2.1 mg/dL (ref 1.7–2.4)

## 2023-10-19 LAB — CULTURE, RESPIRATORY W GRAM STAIN

## 2023-10-19 LAB — MISC LABCORP TEST (SEND OUT): Labcorp test code: 164284

## 2023-10-19 LAB — GLUCOSE, CAPILLARY
Glucose-Capillary: 133 mg/dL — ABNORMAL HIGH (ref 70–99)
Glucose-Capillary: 192 mg/dL — ABNORMAL HIGH (ref 70–99)
Glucose-Capillary: 235 mg/dL — ABNORMAL HIGH (ref 70–99)
Glucose-Capillary: 173 mg/dL — ABNORMAL HIGH (ref 70–99)

## 2023-10-19 LAB — CULTURE, BLOOD (ROUTINE X 2)
Culture: NO GROWTH
Culture: NO GROWTH
Special Requests: ADEQUATE
Special Requests: ADEQUATE

## 2023-10-19 LAB — CBC
HCT: 25.4 % — ABNORMAL LOW (ref 39.0–52.0)
Hemoglobin: 8.3 g/dL — ABNORMAL LOW (ref 13.0–17.0)
MCH: 29.7 pg (ref 26.0–34.0)
MCHC: 32.7 g/dL (ref 30.0–36.0)
MCV: 91 fL (ref 80.0–100.0)
Platelets: 365 10*3/uL (ref 150–400)
RBC: 2.79 MIL/uL — ABNORMAL LOW (ref 4.22–5.81)
RDW: 15.4 % (ref 11.5–15.5)
WBC: 11.2 10*3/uL — ABNORMAL HIGH (ref 4.0–10.5)
nRBC: 0.4 % — ABNORMAL HIGH (ref 0.0–0.2)

## 2023-10-19 LAB — PHOSPHORUS: Phosphorus: 3.4 mg/dL (ref 2.5–4.6)

## 2023-10-19 LAB — C-REACTIVE PROTEIN: CRP: 3.7 mg/dL — ABNORMAL HIGH (ref <1.0)

## 2023-10-19 MED ORDER — NITROGLYCERIN 0.4 MG SL SUBL
SUBLINGUAL_TABLET | SUBLINGUAL | Status: AC
  Filled 2023-10-19: qty 1

## 2023-10-19 MED ORDER — IPRATROPIUM-ALBUTEROL 0.5-2.5 (3) MG/3ML IN SOLN
3.0000 mL | Freq: Four times a day (QID) | RESPIRATORY_TRACT | Status: DC | PRN
  Administered 2023-10-28 – 2023-10-29 (×2): 3 mL via RESPIRATORY_TRACT
  Filled 2023-10-19 (×2): qty 3

## 2023-10-19 NOTE — Progress Notes (Signed)
 Progress Note   [name] [id number]:[id number] DOB: Mar 14, 1961 DOA: 09/10/2023     39 DOS: the patient was seen and examined on 10/19/2023   Brief hospital course: ICU transfer for 10/08/2023.  Taken from prior notes.  Benjamin Bolton is a 63 y.o. male with medical history significant of COPD on 3L O2, HLD, pre-DM, DVT on Eliquis, who presents with SOB.  admitted with Acute on Chronic Hypoxic and Hypercapnic Respiratory Failure in the setting of Acute COPD Exacerbation due to RSV Pneumonia requiring intubation and mechanical ventilation on 09/13/2023.  Was successfully extubated on 1/27, currently on his baseline 3L nasal cannula, not requiring vasopressors.  Hospital course has been complicated questionable HAP and metabolic encephalopathy, which has limited his ability to participate with PT.  While he was Lucid after extubation, did make himself DNR/DNI in presence of family and chaplain.  He will remain full scope of medical care.  . Patient is chronically maintained on 20 mg of prednisone which will continue for now but will require a prolonged taper. He is refusing certain aspects of his care which is challenging given his overall comorbidities.   Initial PT recommendations for CIR which were downgraded to SNF due to his ability to participate with physical therapy.  Might need LTAC placement.  Echocardiogram done on 09/14/2023 with low normal EF and indeterminate diastolic parameters.  Troponin peaked at 115.  Did receive pressors intermittently while in ICU.  Cardiology was consulted which later signed off and no further cardiac evaluation indicated at this time.  There was a questionable superimposed HAP which was treated with antibiotics.  Multiple electrolyte abnormalities which include mild hyperkalemia, hyponatremia and hypophosphatemia which were resolved with repletion.  2/5: Vital stable with borderline soft blood pressure, saturating 100% on 2 L of oxygen, temperature of  100.4 recorded over the past 24-hour.  Labs with slight worsening of leukocytosis at 12.5, rest of the labs stable, CRP improving, CBG elevated. Sputum cultures from 1/22 with normal respiratory flora.  Patient is currently on Zosyn. Very muffled voice and weak cough.  Patient is quite debilitated and is appropriate for LTAC as recommended and PCCM note. Patient is on tube feed through NGT as having postintubation dysphagia.  2/6: Hemodynamically and labs seems stable, mild hypophosphatemia at 2.4.  Anemia panel consistent with anemia of chronic disease with low iron and saturation, ferritin elevated.  Had a bed offer at [facility], pending insurance authorization.  Patient is reluctant for PEG tube, stating that improvement in dysphagia and he will like to try more p.o. intake. Small improvement in voice and cough reflex.  Completed the course of Zosyn.  2/7: Hemodynamically stable, slowly improving voice and cough.  Improving CRP.  Pending insurance authorization for LTAC.  Still does not want PEG tube.  2/8: Oxycodone added at his request due to complaint of generalized bodyaches.  Worsening cough and leukocytosis today, repeating chest x-ray.  Ordered a repeat swallow evaluation. Patient just completed course of Zosyn.  2/9: Overnight quite agitated and unable to sleep.  Ativan helped.  Complaining of generalized aches and pain, appears to have very poor insight of his condition.  Labs with mild pseudohyponatremia secondary to hyperglycemia.  Per patient no appetite but p.o. intake slowly improving. Repeat chest x-ray with persistent but improving bilateral pneumonia. CRP again started trending up.  Continue to have some diarrhea, ordered CBC as add-on  2/10: LTAC placement was declined by insurance even with peer to peer review.  They want a definitive  feeding plan in place.  Patient with poor appetite but able to swallow now.  We will try giving him a break from NG tube, he was on a continuous  field.  Might pull the NG tube out and see if he can take adequate p.o. intake.  If he continued to need tube feeding then PEG will be the best option but patient does not want it.  2/11: Patient became febrile again overnight, maximum temperature recorded of 102.  CRP again started trending up, slight worsening of leukocytosis, repeat chest x-ray with increasing infiltrate.  Patient recently received multiple courses of antibiotic.  UA does not look infected.  Infectious disease was also consulted.  Blood, sputum and wound cultures ordered.  Starting on Zosyn Calorie count with p.o. intake started, tube feeding switched to bolus if he is unable to eat p.o. Appeal was made for [facility] decision.  2/12: Maximum temperature recorded over the past 24 hours was 100.2, remained tachycardic, worsening leukocytosis at 14.4, and CRP.  CT chest abdomen and pelvis with continued consolidation in lower lobes bilaterally concerning for pneumonia.  Mild gaseous distention of colon, may reflect mild ileus, repeat respiratory viral panel negative, cultures are pending. Patient agrees to PEG placement, IR was consulted and they are would like to wait until current fever and concern of infection is sorted out.  Patient also need 48 hours of Eliquis break before placement. LTAC appeal still pending  Patient is high risk for worsening comorbidities and mortality, palliative care was consulted.  2/13: Patient remained febrile, NG tube was removed today so he can try improving his p.o. intake, patient will not decide about PEG tube after p.o. challenge.  ID to decide about whether to add further antibiotics, worsening inflammatory markers.  Patient still not ready for hospice.  2/14 d/w pt and he agreed for PEG tube insertion, d/w POA sister and she agreed as well. Texted to IR to find out when we can schedule PEG tube insertion and how many days we need to hold Eliquis?  Awaiting for reply.   Assessment and Plan:  #  Acute on chronic respiratory failure with hypoxia (HCC) # COPD exacerbation-treated and resolved # RSV pneumonia-treated # Concern of superimposed HAP. S/p prolonged intubation with advanced underlying COPD. Currently saturating well on 2 L of oxygen which is his baseline. Postintubation muffled voice and weak cough. S/p Zosyn till 2/11 for concern of HAP although tracheal cultures with normal respiratory flora.  Inflammatory markers seems improving. -Continue supplemental oxygen -Incentive spirometry and flutter valve -Chest PT -Continue with supportive care -Repeat swallow evaluation due to worsening cough -Repeat chest x-ray-with persistent bilateral pneumonia  Fever Tam 101 on 2/12 Patient became febrile again with worsening leukocytosis and CRP.  Repeat chest x-ray with worsening infiltrate, ordered repeat blood, sputum and wound cultures.  Diarrhea has been resolved.  UA does not look infected.  Preliminary blood cultures negative, sputum cultures pending.  Legionella and TB QuantiFERON was also sent by ID CT chest, abdomen and pelvis with concern of bilateral basal opacities/pneumonia. ID consulted, s/p Zosyn, rec to follow off antibiotics. 2/11 blood culture NGTD, 2/11 RVP negative, 2/12 COVID-negative 2/12 RSV positive  # Suspected candidal esophagitis as per ID Started Diflucan 20 mg p.o. daily, continue for 2 weeks   # COPD exacerbation, resolved Patient is on prednisone 20 mg chronically which has been continued  # DVT (deep venous thrombosis) (HCC) Patient has history of DVT. 2/14 held Eliquis, started Lovenox for now due to  PEG tube placement on Monday   # Protein-calorie malnutrition, severe (HCC)  Swallowing much improved. Started calorie count as p.o. intake slowly improving, appetite remained poor and unable to meet his caloric requirement, losing weight.   NGT was removed today, IR was consulted yesterday for PEG tube placement when he agreed, today he again  saying that does not want PEG tube and wants to give himself a p.o. challenge. -Continue to monitor 2/14 agreed for PEG tube placement 2/14 held Eliquis, started Lovenox for now due to PEG tube placement on Monday IR consulted to keep him on his schedule for PEG tube on Monday  Hypotension, started midodrine 5 mg p.o. 3 times daily with holding parameters on 2/15   Electrolyte abnormality Patient had multiple electrolyte abnormalities during this complicated course of hospitalization which involved mild hyperkalemia, hyponatremia and hypophosphatemia which has been improved. -Continue to monitor-replete as needed  Normochromic anemia Hemoglobin currently stable at 8.7.  Likely due to chronic disease. Anemia panel consistent with anemia of chronic disease -Monitor hemoglobin -Transfuse if below 7  Pre-diabetes A1c of 6.4, CBG currently elevated. Patient is on tube feed and steroid. -Continue with SSI  Elevated troponin Echocardiogram done on 09/14/2023 with 50 to 55% EF, indeterminate diastolic parameter.  Likely secondary to demand ischemia.  Troponin peaked at 115 Initially cardiology was consulted and later the signed off, no intervention required.  Diarrhea Improved.  No abdominal pain but to have mild leukocytosis. High risk for C. Difficile. -Continue with supportive care  # Iron deficiency, transferrin saturation 13%, started oral iron supplement with vitamin C.    Nutrition Documentation    Flowsheet Row ED to Hosp-Admission (Current) from 09/10/2023 in [facility]  Nutrition Problem Severe Malnutrition  Etiology acute illness  Nutrition Goal Patient will meet greater than or equal to 90% of their needs  Interventions Refer to RD note for recommendations     ,  Active Pressure Injury/Wound(s)     Pressure Ulcer  Duration          Pressure Injury 10/08/23 Sacrum Mid Unstageable - Full thickness tissue loss in which the base of the injury is  covered by slough (yellow, tan, gray, green or brown) and/or eschar (tan, brown or black) in the wound bed. 10 days             Subjective:  No significant overnight events, patient's breathing is stable, still having dysphagia, trying to eat more. Resting comfortably, denied any complaints.  Patient agreed for PEG tube placement tomorrow a.m.   Physical Exam: Vitals:   10/19/23 0400 10/19/23 0815 10/19/23 1227 10/19/23 1615  BP: 106/70 106/74 100/72 104/70  Pulse:  83 87 99  Resp: (!) 23 18 16 20   Temp: 98.5 F (36.9 C) 98.3 F (36.8 C) 97.9 F (36.6 C) 98.9 F (37.2 C)  TempSrc: Oral  Oral Oral  SpO2:  98% 98% 96%  Weight:      Height:       General.  Frail and severely malnourished gentleman, in no acute distress. Pulmonary.  Lungs clear bilaterally, normal respiratory effort. CV.  Regular rate and rhythm, no JVD, rub or murmur. Abdomen.  Soft, nontender, nondistended, BS positive. CNS.  Alert and oriented .  No focal neurologic deficit. Extremities.  No edema, no cyanosis, pulses intact and symmetrical.  Data Reviewed: Prior data reviewed  Family Communication:   Disposition: Status is: Inpatient Remains inpatient appropriate because: Severity of illness  Planned Discharge Destination: SNF  DVT prophylaxis.  on Lovenox for now Time spent: 40 minutes  This record has been created using Conservation officer, historic buildings. Errors have been sought and corrected,but may not always be located. Such creation errors do not reflect on the standard of care.   Author: Gillis Santa, MD 10/19/2023 4:16 PM  For on call review [email or web address].

## 2023-10-20 ENCOUNTER — Inpatient Hospital Stay: Payer: 59 | Admitting: Radiology

## 2023-10-20 DIAGNOSIS — J9621 Acute and chronic respiratory failure with hypoxia: Secondary | ICD-10-CM | POA: Diagnosis not present

## 2023-10-20 LAB — BASIC METABOLIC PANEL
Anion gap: 12 (ref 5–15)
BUN: 15 mg/dL (ref 8–23)
CO2: 30 mmol/L (ref 22–32)
Calcium: 9.2 mg/dL (ref 8.9–10.3)
Chloride: 93 mmol/L — ABNORMAL LOW (ref 98–111)
Creatinine, Ser: 0.61 mg/dL (ref 0.61–1.24)
GFR, Estimated: >60 mL/min (ref >60)
Glucose, Bld: 128 mg/dL — ABNORMAL HIGH (ref 70–99)
Potassium: 3.3 mmol/L — ABNORMAL LOW (ref 3.5–5.1)
Sodium: 135 mmol/L (ref 135–145)

## 2023-10-20 LAB — FUNGITELL BETA-D-GLUCAN
Fungitell Value:: <31.25 pg/mL
Result Name:: NEGATIVE

## 2023-10-20 LAB — CBC
HCT: 27.7 % — ABNORMAL LOW (ref 39.0–52.0)
Hemoglobin: 8.7 g/dL — ABNORMAL LOW (ref 13.0–17.0)
MCH: 29.1 pg (ref 26.0–34.0)
MCHC: 31.4 g/dL (ref 30.0–36.0)
MCV: 92.6 fL (ref 80.0–100.0)
Platelets: 408 10*3/uL — ABNORMAL HIGH (ref 150–400)
RBC: 2.99 MIL/uL — ABNORMAL LOW (ref 4.22–5.81)
RDW: 15.9 % — ABNORMAL HIGH (ref 11.5–15.5)
WBC: 12.8 10*3/uL — ABNORMAL HIGH (ref 4.0–10.5)
nRBC: 0.4 % — ABNORMAL HIGH (ref 0.0–0.2)

## 2023-10-20 LAB — GLUCOSE, CAPILLARY
Glucose-Capillary: 122 mg/dL — ABNORMAL HIGH (ref 70–99)
Glucose-Capillary: 173 mg/dL — ABNORMAL HIGH (ref 70–99)
Glucose-Capillary: 201 mg/dL — ABNORMAL HIGH (ref 70–99)
Glucose-Capillary: 177 mg/dL — ABNORMAL HIGH (ref 70–99)
Glucose-Capillary: 161 mg/dL — ABNORMAL HIGH (ref 70–99)

## 2023-10-20 LAB — QUANTIFERON-TB GOLD PLUS: QuantiFERON-TB Gold Plus: NEGATIVE

## 2023-10-20 LAB — QUANTIFERON-TB GOLD PLUS (RQFGPL)
QuantiFERON Mitogen Value: 4.58 [IU]/mL
QuantiFERON Nil Value: 0.08 [IU]/mL
QuantiFERON TB1 Ag Value: 0.09 [IU]/mL
QuantiFERON TB2 Ag Value: 0.07 [IU]/mL

## 2023-10-20 LAB — MAGNESIUM: Magnesium: 2 mg/dL (ref 1.7–2.4)

## 2023-10-20 LAB — PHOSPHORUS: Phosphorus: 3.2 mg/dL (ref 2.5–4.6)

## 2023-10-20 LAB — PROCALCITONIN: Procalcitonin: <0.1 ng/mL

## 2023-10-20 MED ORDER — POTASSIUM CHLORIDE 20 MEQ PO PACK
40.0000 meq | PACK | Freq: Once | ORAL | Status: AC
  Administered 2023-10-20: 40 meq via ORAL
  Filled 2023-10-20: qty 2

## 2023-10-20 NOTE — Consult Note (Signed)
 Chief Complaint: Patient was seen in consultation today for dysphasia and malnutrition, with consideration for percutaneous gastrostomy tube placement.  Referring Provider(s): Dr. Rondel Oh, MD  Supervising Physician: Roanna Banning  Patient Status: [facility] - In-pt  Current Code Status  Limited: Do not attempt resuscitation (DNR) -DNR-LIMITED -Do Not Intubate/DNI - Set by Georgiann Cocker, FNP at 10/02/2023 1423 (View report)  Question Answer  If pulseless and not breathing No CPR or chest compressions.  In Pre-Arrest Conditions (Patient Is Breathing and Has A Pulse) Do not intubate. Provide all appropriate non-invasive medical interventions. Avoid ICU transfer unless indicated or required.  Consent: Discussion documented in EHR or advanced directives reviewed     Patient currently has DNR order in place. Discussion with the patient and family regarding wishes.  The DNR order is rescinded during the procedure and the patient consents to the use of any resuscitation procedure needed to treat the clinical events that occur.   History of Present Illness: Benjamin Bolton is a 63 y.o. male with PMHx significant for HLD, COPD, DVT on elequis, pre-diabetes, pulmonary nodule, GERD, migraines, and tobacco abuse.  Per Dr. Remus Blake progress note on 10/20/23: # Protein-calorie malnutrition, severe (HCC)  Swallowing much improved. Started calorie count as p.o. intake slowly improving, appetite remained poor and unable to meet his caloric requirement, losing weight.   NGT was removed today, IR was consulted yesterday for PEG tube placement when he agreed, today he again saying that does not want PEG tube and wants to give himself a p.o. challenge. -Continue to monitor 2/14 agreed for PEG tube placement 2/14 held Eliquis, started Lovenox for now due to PEG tube placement on Monday IR consulted to keep him on his schedule for PEG tube on Monday  Interventional Radiology was requested for  percutaneous gastrostomy tube placement. Reviewed and approved by Dr. Milford Cage. Patient is scheduled for same in IR tomorrow.   All labs and medications are within acceptable parameters. No pertinent allergies. Patient will be NPO at midnight.    Currently without any significant complaints. Patient alert and laying in bed,calm. Denies any fevers, headache, chest pain, SOB, cough, abdominal pain, nausea, vomiting or bleeding.     Past Medical History:  Diagnosis Date   COPD (chronic obstructive pulmonary disease) (HCC)    DVT (deep venous thrombosis) (HCC)    Dyspnea    GERD (gastroesophageal reflux disease)    no meds   HLD (hyperlipidemia)    Hx of migraines    Oxygen dependent    3 L [place]   Pneumonia 2023   Pre-diabetes    Pulmonary nodule    Rotator cuff tear, right    Tobacco abuse    Tobacco use     Past Surgical History:  Procedure Laterality Date   CHEST TUBE INSERTION     SHOULDER ARTHROSCOPY WITH SUBACROMIAL DECOMPRESSION, ROTATOR CUFF REPAIR AND BICEP TENDON REPAIR Right 02/18/2023   Procedure: RIGHT SHOULDER ARTHROSCOPY WITH DEBRIDEMENT, DECOMPRESSION, ROTATOR CUFF REPAIR, BICEPS TENODESIS.;  Surgeon: Christena Flake, MD;  Location: [facility] ORS;  Service: Orthopedics;  Laterality: Right;    Allergies: Patient has no active allergies.  Medications: Prior to Admission medications   Medication Sig Start Date End Date Taking? Authorizing Provider  albuterol (PROVENTIL) (2.5 MG/3ML) 0.083% nebulizer solution USE 1 VIAL IN NEBULIZER EVERY 6 HOURS AS NEEDED FOR WHEEZING 06/28/21  Yes [provider]  albuterol (VENTOLIN HFA) 108 (90 Base) MCG/ACT inhaler Inhale 1 puff into the lungs every 4 (four)  hours as needed for wheezing or shortness of breath.   Yes [provider]  APIXABAN Everlene Balls) VTE STARTER PACK (10MG  AND 5MG ) Take as directed on package: start with two-5mg  tablets twice daily for 7 days. On day 8, switch to one-5mg  tablet twice daily. 02/26/23  Yes  Merwyn Katos, MD  ondansetron (ZOFRAN) 4 MG tablet Take 1 tablet (4 mg total) by mouth every 6 (six) hours as needed for nausea. 02/19/23  Yes Anson Oregon, PA-C  oxyCODONE (OXY IR/ROXICODONE) 5 MG immediate release tablet Take 1 tablet by mouth 2 (two) times daily as needed. 08/29/23  Yes [provider]  OXYGEN Inhale 3 L into the lungs continuous.   Yes [provider]  predniSONE (DELTASONE) 10 MG tablet Take 2-6 tablets by mouth daily with breakfast.  ORIGINAL GNF:AOZH 6 tabs daily x4 days; then 4 tabs daily x4 days; then 2 tabs daily x4 days.   Yes [provider]  predniSONE (DELTASONE) 20 MG tablet Take 20 mg by mouth daily with breakfast.   Yes [provider]  TRELEGY ELLIPTA 100-62.5-25 MCG/ACT AEPB Inhale 1 puff into the lungs every morning. 07/04/21  Yes [provider]  amoxicillin-clavulanate (AUGMENTIN) 875-125 MG tablet Take 1 tablet by mouth every 12 (twelve) hours. Patient not taking: Reported on 09/10/2023    [provider]  benralizumab (FASENRA PEN) 30 MG/ML prefilled autoinjector Inject 30 mg into the skin as directed. Patient not taking: Reported on 09/10/2023 07/23/23   [provider]  nystatin (MYCOSTATIN) 100000 UNIT/ML suspension Take 5 mLs by mouth 4 (four) times daily. Patient not taking: Reported on 09/10/2023 09/04/23   [provider]  omeprazole (PRILOSEC) 20 MG capsule Take 20 mg by mouth daily. Patient not taking: Reported on 09/10/2023    [provider]  oxyCODONE (OXY IR/ROXICODONE) 5 MG immediate release tablet Take 1-2 tablets (5-10 mg total) by mouth every 4 (four) hours as needed for severe pain. 02/19/23   Anson Oregon, PA-C     History reviewed. No pertinent family history.  Social History   Socioeconomic History   Marital status: Single    Spouse name: Not on file   Number of children: Not on file   Years of education: Not on file   Highest education level:  Not on file  Occupational History   Not on file  Tobacco Use   Smoking status: Some Days    Current packs/day: 0.25    Average packs/day: 0.3 packs/day for 48.0 years (12.0 ttl pk-yrs)    Types: Cigarettes   Smokeless tobacco: Never  Vaping Use   Vaping status: Never Used  Substance and Sexual Activity   Alcohol use: Yes    Comment: occ   Drug use: Never   Sexual activity: Not on file  Other Topics Concern   Not on file  Social History Narrative   Not on file   Social Drivers of Health   Financial Resource Strain: Low Risk  (08/25/2023)   Received from [facility]   Overall Financial Resource Strain (CARDIA)    Difficulty of Paying Living Expenses: Not very hard  Recent Concern: Financial Resource Strain - Medium Risk (07/23/2023)   Received from [facility]   Overall Financial Resource Strain (CARDIA)    Difficulty of Paying Living Expenses: Somewhat hard  Food Insecurity: No Food Insecurity (09/12/2023)   Hunger Vital Sign    Worried About Running Out of Food in the Last Year:  Never true    Ran Out of Food in the Last Year: Never true  Recent Concern: Food Insecurity - Food Insecurity Present (08/25/2023)   Received from [facility]   Hunger Vital Sign    Worried About Running Out of Food in the Last Year: Often true    Ran Out of Food in the Last Year: Often true  Transportation Needs: No Transportation Needs (09/12/2023)   PRAPARE - Administrator, Civil Service (Medical): No    Lack of Transportation (Non-Medical): No  Recent Concern: Transportation Needs - Unmet Transportation Needs (08/25/2023)   Received from [facility] - Transportation    In the past 12 months, has lack of transportation kept you from medical appointments or from getting medications?: Yes    Lack of Transportation (Non-Medical): No  Physical Activity: Insufficiently Active (08/25/2023)   Received from  [facility]   Exercise Vital Sign    Days of Exercise per Week: 1 day    Minutes of Exercise per Session: 60 min  Stress: No Stress Concern Present (08/25/2023)   Received from [facility] - Occupational Stress Questionnaire    Feeling of Stress : Not at all  Social Connections: Socially Isolated (08/25/2023)   Received from [facility]   Social Connection and Isolation Panel [NHANES]    Frequency of Communication with Friends and Family: Twice a week    Frequency of Social Gatherings with Friends and Family: Never    Attends Religious Services: Never    Database administrator or Organizations: No    Attends Engineer, structural: Never    Marital Status: Separated     Review of Systems: A 12 point ROS discussed and pertinent positives are indicated in the HPI above.  All other systems are negative.  Vital Signs: BP 104/68   Pulse 86   Temp 98.7 F (37.1 C) (Oral)   Resp 20   Ht 6' (1.829 m)   Wt 120 lb 2.4 oz (54.5 kg)   SpO2 100%   BMI 16.30 kg/m   Advance Care Plan: The advanced care place/surrogate decision maker was discussed at the time of visit and the patient did not wish to discuss or was not able to name a surrogate decision maker or provide an advance care plan.  Physical Exam Vitals reviewed.  Constitutional:      General: He is not in acute distress.    Appearance: Normal appearance. He is ill-appearing.  HENT:     Mouth/Throat:     Mouth: Mucous membranes are moist.  Cardiovascular:     Rate and Rhythm: Normal rate and regular rhythm.     Pulses: Normal pulses.     Heart sounds: No murmur heard. Pulmonary:     Effort: Pulmonary effort is normal. No respiratory distress.     Breath sounds: Examination of the right-upper field reveals rhonchi. Examination of the left-upper field reveals rhonchi. Examination of the right-middle field reveals rhonchi.  Examination of the left-middle field reveals rhonchi. Examination of the right-lower field reveals rhonchi. Examination of the left-lower field reveals rhonchi. Rhonchi present.  Abdominal:     General: Abdomen is flat.     Palpations: Abdomen is soft.     Tenderness: There is no abdominal tenderness.  Musculoskeletal:        General: Normal range of motion.  Cervical back: Normal range of motion.  Skin:    General: Skin is warm and dry.  Neurological:     Mental Status: He is alert and oriented to person, place, and time.  Psychiatric:        Mood and Affect: Mood normal.        Behavior: Behavior normal.        Thought Content: Thought content normal.        Judgment: Judgment normal.     Imaging: CT CHEST ABDOMEN PELVIS W CONTRAST Result Date: 10/15/2023 CLINICAL DATA:  Sepsis EXAM: CT CHEST, ABDOMEN, AND PELVIS WITH CONTRAST TECHNIQUE: Multidetector CT imaging of the chest, abdomen and pelvis was performed following the standard protocol during bolus administration of intravenous contrast. RADIATION DOSE REDUCTION: This exam was performed according to the departmental dose-optimization program which includes automated exposure control, adjustment of the mA and/or kV according to patient size and/or use of iterative reconstruction technique. CONTRAST:  80mL OMNIPAQUE IOHEXOL 300 MG/ML  SOLN COMPARISON:  09/25/2023 FINDINGS: CT CHEST FINDINGS Cardiovascular: Heart is normal size. Aorta is normal caliber. Coronary artery and aortic atherosclerosis. Mediastinum/Nodes: No mediastinal, hilar, or axillary adenopathy. Trachea and esophagus are unremarkable. Thyroid unremarkable. Lungs/Pleura: Severe emphysema. Biapical scarring. Consolidation in the lower lobes bilaterally, similar to prior study most suggestive of pneumonia. No effusions. Musculoskeletal: Chest wall soft tissues are unremarkable. No acute bony abnormality. CT ABDOMEN PELVIS FINDINGS Hepatobiliary: No focal hepatic abnormality.  Gallbladder unremarkable. Pancreas: No focal abnormality or ductal dilatation. Spleen: No focal abnormality.  Normal size. Adrenals/Urinary Tract: No adrenal abnormality. No focal renal abnormality. No stones or hydronephrosis. Urinary bladder is unremarkable. Stomach/Bowel: NG tube is in the stomach. Mild gaseous distention of the colon. Stomach and small bowel decompressed. Vascular/Lymphatic: No evidence of aneurysm or adenopathy. Aortic atherosclerosis. Reproductive: No visible focal abnormality. Other: No free fluid or free air. Musculoskeletal: No acute bony abnormality. IMPRESSION: Continued consolidation in the lower lobes bilaterally concerning for pneumonia. Severe emphysema. Coronary artery disease, aortic atherosclerosis. Mild gaseous distention of the colon, particularly transverse colon may reflect mild ileus. Electronically Signed   By: Charlett Nose M.D.   On: 10/15/2023 00:59   DG Chest 2 View Result Date: 10/14/2023 CLINICAL DATA:  Fever and shortness of breath. EXAM: CHEST - 2 VIEW COMPARISON:  Chest radiograph dated 10/11/2023. FINDINGS: Enteric tube extends below the diaphragm the tip beyond the inferior margin of the image. Bilateral lower lobe opacities again noted and not significantly change. Small pleural effusions suspected. No pneumothorax. The cardiac silhouette is within normal limits. No acute osseous pathology. IMPRESSION: Persistent bilateral lower lobe opacities. Continued follow-up recommended. Electronically Signed   By: Elgie Collard M.D.   On: 10/14/2023 13:49   DG Chest Port 1 View Result Date: 10/11/2023 CLINICAL DATA:  Worsening cough. EXAM: PORTABLE CHEST 1 VIEW COMPARISON:  Chest x-ray dated October 07, 2023. FINDINGS: Interval removal of the right-sided PICC line. Unchanged enteric tube. The heart size and mediastinal contours are within normal limits. The lungs remain hyperinflated. Persistent but improved bilateral lower lobe consolidation. No pleural effusion or  pneumothorax. No acute osseous abnormality. IMPRESSION: 1. Persistent but improved bilateral lower lobe pneumonia. Electronically Signed   By: Obie Dredge M.D.   On: 10/11/2023 16:05   DG Chest Port 1 View Result Date: 10/07/2023 CLINICAL DATA:  Acute respiratory failure and hypoxia. EXAM: PORTABLE CHEST 1 VIEW COMPARISON:  Chest radiograph dated 10/04/2023. FINDINGS: Right-sided PICC in similar position. Interval placement of enteric tube with tip  beyond the inferior margin of the image. Background of emphysema. No significant interval change in bilateral lower lung field airspace opacities, right greater than left. No pneumothorax. Stable cardiac silhouette. No acute osseous pathology. IMPRESSION: 1. Interval placement of enteric tube with tip beyond the inferior margin of the image. 2. No significant interval change in bilateral lower lung field airspace opacities, right greater than left. Electronically Signed   By: Elgie Collard M.D.   On: 10/07/2023 09:37   DG Abd 1 View Result Date: 10/06/2023 CLINICAL DATA:  Nasogastric placement EXAM: ABDOMEN - 1 VIEW COMPARISON:  10/05/2023 FINDINGS: Nasogastric tube enters the stomach passes along the greater curvature with its tip in the region of the antrum. Gas pattern unremarkable. Pulmonary infiltrate and volume loss in the lower lungs. IMPRESSION: Nasogastric tube tip in the region of the antrum. Electronically Signed   By: Paulina Fusi M.D.   On: 10/06/2023 18:24   DG Abd 1 View Result Date: 10/05/2023 CLINICAL DATA:  [id number] Encounter for orogastric (OG) tube placement [id number] EXAM: ABDOMEN - 1 VIEW COMPARISON:  09/21/2023 FINDINGS: Limited radiograph of the lower chest and upper abdomen was obtained for the purposes of enteric tube localization. Enteric tube is seen coursing below the diaphragm with distal tip terminating within the expected location of the gastric body. Side port positioned just below the level of the GE junction. IMPRESSION: Enteric  tube terminates within the expected location of the gastric body. Side port positioned just below the level of the GE junction. Recommend 4-5 cm of advancement. Electronically Signed   By: Duanne Guess D.O.   On: 10/05/2023 15:12   DG Chest Port 1 View Result Date: 10/04/2023 CLINICAL DATA:  Follow-up of "infiltrate EXAM: PORTABLE CHEST 1 VIEW COMPARISON:  08/30/2024 FINDINGS: Midline trachea. Normal heart size. Atherosclerosis in the transverse aorta. Small left pleural effusion is similar. No pneumothorax. Right greater than left lower lung airspace disease is felt to be similar. Underlying interstitial prominence and indistinctness. Numerous leads and wires project over the chest. A right-sided PICC line terminates at the superior caval/atrial junction. IMPRESSION: Hyperinflation and interstitial thickening, likely related to COPD/chronic bronchitis. Bilateral lower lobe airspace opacities are similar, suspicious for infection or aspiration. Electronically Signed   By: Jeronimo Greaves M.D.   On: 10/04/2023 18:02   US Venous Img Upper Uni Right(DVT) Result Date: 10/02/2023 CLINICAL DATA:  Right upper extremity edema.  Evaluate for DVT. EXAM: RIGHT UPPER EXTREMITY VENOUS DOPPLER ULTRASOUND TECHNIQUE: Gray-scale sonography with graded compression, as well as color Doppler and duplex ultrasound were performed to evaluate the upper extremity deep venous system from the level of the subclavian vein and including the jugular, axillary, basilic, radial, ulnar and upper cephalic vein. Spectral Doppler was utilized to evaluate flow at rest and with distal augmentation maneuvers. COMPARISON:  None Available. FINDINGS: Contralateral Subclavian Vein: Respiratory phasicity is normal and symmetric with the symptomatic side. No evidence of thrombus. Normal compressibility. Internal Jugular Vein: No evidence of thrombus. Normal compressibility, respiratory phasicity and response to augmentation. Subclavian Vein: No evidence  of thrombus. Normal compressibility, respiratory phasicity and response to augmentation. Axillary Vein: No evidence of thrombus. Normal compressibility, respiratory phasicity and response to augmentation. Cephalic Vein: Not visualized Basilic Vein: Not visualized Brachial Veins: Appears patent where visualized. Radial Veins: Not visualized as patient refused to continue with the examination. Ulnar Veins: Not visualized as patient refused to continue with the examination. Other Findings:  None visualized. IMPRESSION: No evidence of DVT within the right upper  extremity, though patient refused complete examination. Electronically Signed   By: Simonne Come M.D.   On: 10/02/2023 18:50   DG Chest Port 1 View Result Date: 10/01/2023 CLINICAL DATA:  Follow-up infiltrate EXAM: PORTABLE CHEST 1 VIEW COMPARISON:  X-ray 09/24/2023 and older. CT scan chest without contrast 09/25/2023 FINDINGS: Hyperinflation with chronic lung changes. Tiny pleural effusions. Increasing lung base opacities are identified, right worse than left. Acute infiltrates possible. No pneumothorax. Normal cardiopericardial silhouette. Overlapping cardiac leads. Right-sided PICC in place. IMPRESSION: Hyperinflation. Tiny effusions. Increasing lung base opacities. Recommend continued follow-up Electronically Signed   By: Karen Kays M.D.   On: 10/01/2023 14:37   MR BRAIN WO CONTRAST Result Date: 09/26/2023 CLINICAL DATA:  Follow-up examination for stroke. EXAM: MRI HEAD WITHOUT CONTRAST TECHNIQUE: Multiplanar, multiecho pulse sequences of the brain and surrounding structures were obtained without intravenous contrast. COMPARISON:  Prior CT from earlier the same day. FINDINGS: Brain: Examination mildly degraded by motion artifact. Cerebral volume within normal limits. Patchy T2/FLAIR signal abnormality involving the pons, most like related chronic microvascular ischemic disease. Overall, changes are mild in nature. No evidence for acute or subacute  ischemia. Gray-white matter differentiation maintained. No areas of chronic cortical infarction. No acute intracranial hemorrhage. Small chronic microhemorrhage noted within the right cerebellum. No mass lesion, midline shift or mass effect. No hydrocephalus or extra-axial fluid collection. Pituitary gland and suprasellar region within normal limits. Vascular: Major intracranial vascular flow voids are maintained. Skull and upper cervical spine: Cranial junction within normal limits. Bone marrow signal intensity normal. No scalp soft tissue abnormality. Sinuses/Orbits: Globes orbital soft tissues within normal limits. Paranasal sinuses are largely clear. Moderate bilateral mastoid effusions noted. Patient appears to be intubated. Other: None. IMPRESSION: 1. No acute intracranial abnormality. 2. Mild chronic microvascular ischemic disease for age, primarily involving the pons. Electronically Signed   By: Rise Mu M.D.   On: 09/26/2023 00:49   CT ABDOMEN PELVIS WO CONTRAST Result Date: 09/25/2023 CLINICAL DATA:  Pneumonia and respiratory failure. Evaluation of abdominal anatomy for potential percutaneous gastrostomy tube placement. Prior abdominal film demonstrates colonic ileus with a dilated splenic flexure and transverse colon overlying the stomach. EXAM: CT CHEST, ABDOMEN AND PELVIS WITHOUT CONTRAST TECHNIQUE: Multidetector CT imaging of the chest, abdomen and pelvis was performed following the standard protocol without intravenous contrast. RADIATION DOSE REDUCTION: This exam was performed according to the departmental dose-optimization program which includes automated exposure control, adjustment of the mA and/or kV according to patient size and/or use of iterative reconstruction technique. COMPARISON:  Chest x-ray on 09/24/2023. Abdominal x-ray on 09/21/2023. CT of the chest on 04/17/2023. FINDINGS: CT CHEST FINDINGS WITHOUT CONTRAST Cardiovascular: The heart size is normal. Mild atherosclerosis  of the thoracic aorta without aneurysmal disease. Calcified coronary artery plaque present. No pericardial fluid. Central pulmonary arteries are normal in caliber. Mediastinum/Nodes: No enlarged mediastinal or axillary lymph nodes. Thyroid gland, trachea, and esophagus demonstrate no significant findings. Endotracheal tube terminates just over 3 cm above the carina. Lungs/Pleura: Severe emphysematous lung disease again noted. Consolidation in both posterior lower lobes present, right greater than left, with appearance most likely consistent with acute bilateral pneumonia. No associated pleural fluid or pneumothorax. Radiographic follow-up recommended as there was an area of bandlike density in the posterior right lower lobe on the prior chest CT. Musculoskeletal: No chest wall mass or suspicious bone lesions identified. CT ABDOMEN AND PELVIS FINDINGS WITHOUT CONTRAST Hepatobiliary: No focal liver abnormality is seen. No gallstones, gallbladder wall thickening, or biliary dilatation. Pancreas:  Unremarkable. No pancreatic ductal dilatation or surrounding inflammatory changes. Spleen: Normal in size without focal abnormality. Adrenals/Urinary Tract: Adrenal glands are unremarkable. Kidneys are normal, without renal calculi, focal lesion, or hydronephrosis. Bladder is unremarkable. Stomach/Bowel: Nasogastric tube extends through the stomach and terminates near the pylorus. No evidence of hiatal hernia. Colonic ileus pattern shows significant improvement since the prior radiograph with no further significant gaseous distension of the colon. No evidence of bowel obstruction, free intraperitoneal air or inflammatory process. Although the splenic flexure does contact the lateral wall of the stomach, there does not appear to be any colonic interposition between the stomach and gastric wall. There is no prohibitive anatomy to attempted percutaneous gastrostomy. The appendix is normal. Vascular/Lymphatic: Mild atherosclerosis  of the abdominal aorta without aneurysm. No lymphadenopathy identified in the abdomen or pelvis. Other: No abdominal wall hernia or abnormality. No abdominopelvic ascites. Musculoskeletal: No acute or significant osseous findings. IMPRESSION: 1. Consolidation in both posterior lower lobes, right greater than left, with appearance most likely consistent with acute bilateral pneumonia. Radiographic follow-up recommended as there was an area of bandlike density in the posterior right lower lobe on the prior chest CT. 2. Severe emphysematous lung disease. 3. Colonic ileus pattern shows significant improvement since the prior radiograph with no further significant gaseous distension of the colon. 4. Although the splenic flexure does contact the lateral wall of the stomach, there does not appear to be any colonic interposition between the stomach and gastric wall. There is no prohibitive anatomy to attempted percutaneous gastrostomy. 5. Aortic atherosclerosis. Aortic Atherosclerosis (ICD10-I70.0) and Emphysema (ICD10-J43.9). Electronically Signed   By: Irish Lack M.D.   On: 09/25/2023 16:46   CT CHEST WO CONTRAST Result Date: 09/25/2023 CLINICAL DATA:  Pneumonia and respiratory failure. Evaluation of abdominal anatomy for potential percutaneous gastrostomy tube placement. Prior abdominal film demonstrates colonic ileus with a dilated splenic flexure and transverse colon overlying the stomach. EXAM: CT CHEST, ABDOMEN AND PELVIS WITHOUT CONTRAST TECHNIQUE: Multidetector CT imaging of the chest, abdomen and pelvis was performed following the standard protocol without intravenous contrast. RADIATION DOSE REDUCTION: This exam was performed according to the departmental dose-optimization program which includes automated exposure control, adjustment of the mA and/or kV according to patient size and/or use of iterative reconstruction technique. COMPARISON:  Chest x-ray on 09/24/2023. Abdominal x-ray on 09/21/2023. CT of  the chest on 04/17/2023. FINDINGS: CT CHEST FINDINGS WITHOUT CONTRAST Cardiovascular: The heart size is normal. Mild atherosclerosis of the thoracic aorta without aneurysmal disease. Calcified coronary artery plaque present. No pericardial fluid. Central pulmonary arteries are normal in caliber. Mediastinum/Nodes: No enlarged mediastinal or axillary lymph nodes. Thyroid gland, trachea, and esophagus demonstrate no significant findings. Endotracheal tube terminates just over 3 cm above the carina. Lungs/Pleura: Severe emphysematous lung disease again noted. Consolidation in both posterior lower lobes present, right greater than left, with appearance most likely consistent with acute bilateral pneumonia. No associated pleural fluid or pneumothorax. Radiographic follow-up recommended as there was an area of bandlike density in the posterior right lower lobe on the prior chest CT. Musculoskeletal: No chest wall mass or suspicious bone lesions identified. CT ABDOMEN AND PELVIS FINDINGS WITHOUT CONTRAST Hepatobiliary: No focal liver abnormality is seen. No gallstones, gallbladder wall thickening, or biliary dilatation. Pancreas: Unremarkable. No pancreatic ductal dilatation or surrounding inflammatory changes. Spleen: Normal in size without focal abnormality. Adrenals/Urinary Tract: Adrenal glands are unremarkable. Kidneys are normal, without renal calculi, focal lesion, or hydronephrosis. Bladder is unremarkable. Stomach/Bowel: Nasogastric tube extends through the stomach and  terminates near the pylorus. No evidence of hiatal hernia. Colonic ileus pattern shows significant improvement since the prior radiograph with no further significant gaseous distension of the colon. No evidence of bowel obstruction, free intraperitoneal air or inflammatory process. Although the splenic flexure does contact the lateral wall of the stomach, there does not appear to be any colonic interposition between the stomach and gastric wall.  There is no prohibitive anatomy to attempted percutaneous gastrostomy. The appendix is normal. Vascular/Lymphatic: Mild atherosclerosis of the abdominal aorta without aneurysm. No lymphadenopathy identified in the abdomen or pelvis. Other: No abdominal wall hernia or abnormality. No abdominopelvic ascites. Musculoskeletal: No acute or significant osseous findings. IMPRESSION: 1. Consolidation in both posterior lower lobes, right greater than left, with appearance most likely consistent with acute bilateral pneumonia. Radiographic follow-up recommended as there was an area of bandlike density in the posterior right lower lobe on the prior chest CT. 2. Severe emphysematous lung disease. 3. Colonic ileus pattern shows significant improvement since the prior radiograph with no further significant gaseous distension of the colon. 4. Although the splenic flexure does contact the lateral wall of the stomach, there does not appear to be any colonic interposition between the stomach and gastric wall. There is no prohibitive anatomy to attempted percutaneous gastrostomy. 5. Aortic atherosclerosis. Aortic Atherosclerosis (ICD10-I70.0) and Emphysema (ICD10-J43.9). Electronically Signed   By: Irish Lack M.D.   On: 09/25/2023 16:46   CT HEAD WO CONTRAST ( ) Result Date: 09/25/2023 CLINICAL DATA:  Anoxic brain damage EXAM: CT HEAD WITHOUT CONTRAST TECHNIQUE: Contiguous axial images were obtained from the base of the skull through the vertex without intravenous contrast. RADIATION DOSE REDUCTION: This exam was performed according to the departmental dose-optimization program which includes automated exposure control, adjustment of the mA and/or kV according to patient size and/or use of iterative reconstruction technique. COMPARISON:  Head CT 09/16/2023 FINDINGS: Brain: No hemorrhage. No hydrocephalus. No extra-axial fluid collection. No mass effect. No mass lesion. No CT evidence of an acute cortical infarct. Mild  downward migration of the cerebellar tonsils measuring up to 3 mm with increased effacement of the fourth ventricle. Vascular: No hyperdense vessel or unexpected calcification. Skull: Negative for an acute fracture. The right mandibular head is anteriorly subluxed. Sinuses/Orbits: Small right middle ear effusion. Small bilateral mastoid effusions. Paranasal sinuses are clear. Orbits are unremarkable. Other: None. IMPRESSION: There is mild downward migration of the cerebellar tonsils with increased effacement of fourth ventricle. While this may be artifactual, could also be seen in the setting of cerebellar edema. Recommend further evaluation with a noncontrast enhanced brain MRI. These results will be called to the ordering clinician or representative by the Radiologist Assistant, and communication documented in the PACS or [employer]. Electronically Signed   By: Lorenza Cambridge M.D.   On: 09/25/2023 16:09   DG Chest Port 1 View Result Date: 09/24/2023 CLINICAL DATA:  i 63 year old male with respiratory failure. EXAM: PORTABLE CHEST 1 VIEW COMPARISON:  Portable chest 09/21/2023 and earlier. FINDINGS: Portable AP semi upright views at 0954 hours. Endotracheal tube tip in good position between the clavicles and carina. Enteric tube courses to the left upper quadrant, tip not included. Stable right upper extremity approach PICC line. Large lung volumes. No pneumothorax, pleural effusion, consolidation. Coarse bilateral pulmonary interstitial opacity has increased bilaterally since 09/19/2023, mildly reticulonodular. Emphysema confirmed on CT chest last year. No acute osseous abnormality identified. Paucity of bowel gas in the upper abdomen. IMPRESSION: 1. Satisfactory endotracheal tube, stable visible enteric tube and  right PICC line. 2. Emphysema (ICD10-J43.9) with increasing coarse bilateral pulmonary interstitial opacity suspicious for bilateral infectious exacerbation. No consolidation or pleural effusion at  this time. Electronically Signed   By: Odessa Fleming M.D.   On: 09/24/2023 10:19   DG Chest Port 1 View Result Date: 09/21/2023 CLINICAL DATA:  Follow-up in patient. ET tube and orogastric tube placement. EXAM: PORTABLE CHEST 1 VIEW COMPARISON:  09/21/2023 and 09/20/2023.  CT, 04/17/2023. FINDINGS: Endotracheal tube tip projects 2.4 cm above the carina. Nasal/orogastric tube passes at least into the distal esophagus, below the included field of view. Right PICC tip projects in the mid superior vena cava. Cardiac silhouette is normal in size. Lungs are hyperexpanded. There are bilateral thickened interstitial markings stable from the prior exams. No convincing pneumonia and no pulmonary edema. All of the right and part the left lung base excluded from the field of view. No pneumothorax. IMPRESSION: 1. No acute findings. 2. COPD. 3. Support apparatus is well positioned. Endotracheal tube tip now projects 2.4 cm above the carina. Electronically Signed   By: Amie Portland M.D.   On: 09/21/2023 16:15   DG Abd 1 View Result Date: 09/21/2023 CLINICAL DATA:  [id number] Encounter for orogastric (OG) tube placement [id number] EXAM: ABDOMEN - 1 VIEW COMPARISON:  09/19/2023 FINDINGS: Limited radiograph of the lower chest and upper abdomen was obtained for the purposes of enteric tube localization. Enteric tube is seen coursing below the diaphragm with distal tip and side port terminating within the expected location of the distal stomach. Gaseous distension of the colon. IMPRESSION: Enteric tube terminates within the expected location of the distal stomach. Electronically Signed   By: Duanne Guess D.O.   On: 09/21/2023 16:13   DG Chest Port 1 View Result Date: 09/21/2023 CLINICAL DATA:  Shortness of breath EXAM: PORTABLE CHEST 1 VIEW COMPARISON:  09/20/2023 FINDINGS: Endotracheal tube tip 7 cm above the carina. Nasogastric tube enters the abdomen. Pulmonary hyperinflation consistent with emphysema as seen previously. Patchy  infiltrate in the left mid lung appears similar to prior study. No worsening or new finding. IMPRESSION: 1. Endotracheal tube tip 7 cm above the carina. Nasogastric tube enters the abdomen. 2. Emphysema. 3. Patchy infiltrate in the left mid lung, similar to prior study. Electronically Signed   By: Paulina Fusi M.D.   On: 09/21/2023 14:48   DG Chest 1 View Result Date: 09/20/2023 CLINICAL DATA:  Shortness of breath EXAM: CHEST  1 VIEW COMPARISON:  09/19/2023 FINDINGS: Endotracheal tube tip in the mid intrathoracic trachea. Enteric tube tip at the gastroesophageal junction with side port in the distal esophagus. Recommend advancement 10 cm. Right PICC tip in the mid SVC. Hyperinflation and chronic bronchitic changes. Bilateral small pleural effusions or pleural thickening. No focal consolidation or pneumothorax. IMPRESSION: 1. Enteric tube tip at the gastroesophageal junction with side port in the distal esophagus. Recommend advancement 10 cm. 2. Emphysema and chronic bronchitic changes. Electronically Signed   By: Minerva Fester M.D.   On: 09/20/2023 22:28    Labs:  CBC: Recent Labs    10/17/23 0930 10/18/23 0815 10/19/23 0708 10/20/23 1002  WBC 13.9* 10.6* 11.2* 12.8*  HGB 8.4* 8.4* 8.3* 8.7*  HCT 25.4* 25.8* 25.4* 27.7*  PLT 358 355 365 408*    COAGS: Recent Labs    09/13/23 1148 09/13/23 1725 09/18/23 0155 09/22/23 1427 09/25/23 1204 09/26/23 1307 09/26/23 2016 09/27/23 0352  INR 1.1  --   --  1.1 1.2 1.3*  --   --  APTT 25   < > 82*  --   --  40* 48* 64*   < > = values in this interval not displayed.    BMP: Recent Labs    10/17/23 0930 10/18/23 0815 10/19/23 0708 10/20/23 1002  NA 135 132* 135 135  K 4.1 3.7 3.6 3.3*  CL 92* 90* 93* 93*  CO2 33* 33* 31 30  GLUCOSE 145* 290* 117* 128*  BUN 22 21 15 15   CALCIUM 9.1 9.0 9.2 9.2  CREATININE 0.75 0.66 0.56* 0.61  GFRNONAA >60 >60 >60 >60    LIVER FUNCTION TESTS: Recent Labs    09/10/23 1312 09/13/23 1350  09/18/23 0446 10/13/23 0814 10/14/23 0241 10/14/23 1829 10/16/23 0951  BILITOT 1.0 0.6  --   --   --  0.6 0.5  AST 20 31  --   --   --  17 24  ALT 20 24  --   --   --  19 22  ALKPHOS 79 76  --   --   --  72 70  PROT 7.1 6.2*  --   --   --  6.8 6.3*  ALBUMIN 3.8 3.4*   < > 2.7* 2.7* 2.7* 2.5*   < > = values in this interval not displayed.    TUMOR MARKERS: No results for input(s): "AFPTM", "CEA", "CA199", "CHROMGRNA" in the last 8760 hours.  Assessment and Plan: Per Dr. Remus Blake progress note on 10/20/23: # Protein-calorie malnutrition, severe (HCC)  Swallowing much improved. Started calorie count as p.o. intake slowly improving, appetite remained poor and unable to meet his caloric requirement, losing weight.   NGT was removed today, IR was consulted yesterday for PEG tube placement when he agreed, today he again saying that does not want PEG tube and wants to give himself a p.o. challenge. -Continue to monitor 2/14 agreed for PEG tube placement 2/14 held Eliquis, started Lovenox for now due to PEG tube placement on Monday IR consulted to keep him on his schedule for PEG tube on Monday  Patient will present for scheduled gastrostomy tube placement in IR tomorrow.  Risks and benefits image guided gastrostomy tube placement was discussed with the patient including, but not limited to the need for a barium enema during the procedure, bleeding, infection, peritonitis and/or damage to adjacent structures.  All of the patient's questions were answered, patient is agreeable to proceed.  Consent signed and in chart.   Thank you for allowing our service to participate in Benjamin Bolton 's care.  Electronically Signed: Sable Feil, PA-C   10/20/2023, 3:44 PM      I spent a total of 40 Minutes in face to face in clinical consultation, greater than 50% of which was counseling/coordinating care for dysphasia and malnutrition with consideration for percutaneous gastrostomy tube  placement.

## 2023-10-20 NOTE — Progress Notes (Signed)
 Physical Therapy Treatment Patient Details Name: Benjamin Bolton MRN: [id number] DOB: 1961-06-20 Today's Date: 10/20/2023   History of Present Illness 63 y.o. 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    PT Comments  Pt pleasant and eager to work with PT.  We were doing some EOB balance and standing tasks when he felt the urge to have a BM.  PT quickly grabbed [facility] and assisted pt onto it; he did this somewhat impulsively and needed direct assist to maintain safety but ultimately he did not need excessive physical assist with any aspect of bed or transfer mobility.  He did, however, need consistent reminders and close supervision due to some impulsivity to insure safety.  Pt was able to maintain static standing ~5 minutes on one attempt, but did tend to fatigue and need increased cuing/assist/encouragement.  Pt will benefit from ongoing PT, continue with POC.    If plan is discharge home, recommend the following: A lot of help with walking and/or transfers;A lot of help with bathing/dressing/bathroom;Assist for transportation;Assistance with cooking/housework;Help with stairs or ramp for entrance   Can travel by private vehicle     No  Equipment Recommendations   (TBD)    Recommendations for Other Services       Precautions / Restrictions Precautions Precautions: Fall Restrictions Weight Bearing Restrictions Per Provider Order: No     Mobility  Bed Mobility Overal bed mobility: Needs Assistance Bed Mobility: Rolling Rolling: Supervision     Sit to supine: Min assist   General bed mobility comments: light assist to get LEs back into bed after mobility, able to effortfully get himself up to sitting w/o phyiscal assist (heavy rail use, extra time)     Transfers Overall transfer level: Needs assistance Equipment used: Rolling walker (2 wheels) Transfers: Sit to/from Stand Sit to Stand: Min assist Stand pivot transfers: Min assist, Mod assist         General transfer comment: Pt was able to stand X 2 with walker, heavy UE use and cuing to do so safely and effectively. Also did more impulsive stand-pivot w/o RW to [facility] in frantic effort to have BM - did so with min/mod A but with poor overall awareness due to urgency    Ambulation/Gait                   Stairs             Wheelchair Mobility     Tilt Bed    Modified Rankin (Stroke Patients Only)       Balance   Sitting-balance support: Feet supported Sitting balance-Leahy Scale: Good     Standing balance support: Bilateral upper extremity supported Standing balance-Leahy Scale: Fair Standing balance comment: reliant on walker, no LOBs but somewhat impulsive and heavily forward leaning getting back to bed as he was fatigued post BM on [facility]                            Communication    Cognition Arousal: Alert Behavior During Therapy: WFL for tasks assessed/performed   PT - Cognitive impairments: No apparent impairments                         Following commands:  Impaired Following commands impaired: Follows one step commands with increased time    Cueing Cueing Techniques: Verbal cues, Tactile cues  Exercises      General Comments General comments (skin integrity, edema, etc.): Pt weak but able to rise from standard height surfaces and when not rushing to get to the commode actually showed ability to rise and stand with walker and maintain balance reasonably well      Pertinent Vitals/Pain Pain Assessment Pain Assessment: Faces Faces Pain Scale: Hurts a little bit Pain Location: general soreness from being in bed, hemorroid pain    Home Living                          Prior Function            PT Goals  (current goals can now be found in the care plan section) Progress towards PT goals: Progressing toward goals    Frequency    Min 1X/week      PT Plan      Co-evaluation              AM-PAC PT "6 Clicks" Mobility   Outcome Measure  Help needed turning from your back to your side while in a flat bed without using bedrails?: A Little Help needed moving from lying on your back to sitting on the side of a flat bed without using bedrails?: A Little Help needed moving to and from a bed to a chair (including a wheelchair)?: A Lot Help needed standing up from a chair using your arms (e.g., wheelchair or bedside chair)?: A Lot Help needed to walk in hospital room?: Total Help needed climbing 3-5 steps with a railing? : Total 6 Click Score: 12    End of Session Equipment Utilized During Treatment: Gait belt Activity Tolerance: Patient limited by fatigue Patient left: in bed;with call bell/phone within reach;with nursing/sitter in room Nurse Communication: Mobility status PT Visit Diagnosis: Unsteadiness on feet (R26.81);Muscle weakness (generalized) (M62.81);Difficulty in walking, not elsewhere classified (R26.2)     Time: 0626-9485 PT Time Calculation (min) (ACUTE ONLY): 26 min  Charges:    $Therapeutic Activity: 23-37 mins PT General Charges $$ ACUTE PT VISIT: 1 Visit                     Malachi Pro, DPT 10/20/2023, 6:34 PM

## 2023-10-20 NOTE — Progress Notes (Signed)
 Nutrition Follow-up  DOCUMENTATION CODES:   Severe malnutrition in context of acute illness/injury  INTERVENTION:   -Continue 500 mg vitamin C BID -Continue MVI with minerals daily -Continue Ensure Enlive po TID, each supplement provides 350 kcal and 20 grams of protein -Continue Magic cup TID with meals, each supplement provides 290 kcal and 9 grams of protein  -If PEG is placed, recommend:  Initiate Osmolite 1.5 @ 60 ml/hr and increase by 10 ml every 8 hours to goal rate of 60 ml/hr.   60 ml Prosource TF daily   30 ml free water flush every 4 hours   Tube feeding regimen provides 2240 kcal (100% of needs), 110 grams of protein, and 1097 ml of H2O.  Total free water: 1277 ml daily    -Continue Vitamin C 500mg  BID via tube  -Pt at high refeed risk; recommend monitor potassium, magnesium and phosphorus labs daily until stable -Continue daily weights  -When PEG is placed, can transition to bolus feeds if desired:    237 ml (1 carton) Osmolite 1.5 6 times daily   50 ml free water flush before and after each feeding administration   Tube feeding regimen provides 2130 kcal (100% of needs), 89 grams of protein, and 1086 ml of H2O. Total free water: 1686 ml daily  NUTRITION DIAGNOSIS:   Severe Malnutrition related to acute illness as evidenced by moderate fat depletion, severe fat depletion, moderate muscle depletion, severe muscle depletion.  Ongoing  GOAL:   Patient will meet greater than or equal to 90% of their needs  Unmet  MONITOR:   Diet advancement, Labs, Weight trends, TF tolerance, Skin, I & O's  REASON FOR ASSESSMENT:   Consult Enteral/tube feeding initiation and management  ASSESSMENT:   63 y/o male with h/o COPD, DVT, HLD, GERD, pulmonary nodule and DM who is admitted with RSV, pneumonia and COPD exacerbation.  1/27- extubated  1/29- s/p BSE- advanced to dysphagia 1 diet with thin liquids 2/3- NGT placed, TF started 2/9- s/p BSE- advanced to  dysphagia 2 diet with thin liquids 2/10- s/p BSE- remain on dysphagia 2 diet with thin liquids 2/13- NGT removed  Per MD notes, pt and sister now want PEG. IR has been consulted for placement.   Pt remains with very poor oral intake. Meal completions 0-25%.   Per TOC notes, awaiting LTACH appeal.   Medications reviewed and include prednisone and miralax.   Labs reviewed: CBGS: 122-235 (inpatient orders for glycemic control are none).    Diet Order:   Diet Order             Diet NPO time specified  Diet effective midnight           DIET DYS 2 Room service appropriate? Yes with Assist; Fluid consistency: Thin  Diet effective now                   EDUCATION NEEDS:   Education needs have been addressed  Skin:  Skin Assessment: Skin Integrity Issues: Skin Integrity Issues:: Unstageable Stage II: - Unstageable: sacrum Other: IAD to buttocks  Last BM:  10/19/23 (type 6)  Height:   Ht Readings from Last 1 Encounters:  09/10/23 6' (1.829 m)    Weight:   Wt Readings from Last 1 Encounters:  10/20/23 54.5 kg    Ideal Body Weight:  80.9 kg  BMI:  Body mass index is 16.3 kg/m.  Estimated Nutritional Needs:   Kcal:  1900-2200kcal/day  Protein:  95-110g/day  Fluid:  1.8-2.1L/day    Levada Schilling, RD, LDN, CDCES Registered Dietitian III Certified Diabetes Care and Education Specialist If unable to reach this RD, please use "RD Inpatient" group chat on secure chat between hours of 8am-4 pm daily

## 2023-10-20 NOTE — Progress Notes (Signed)
 Progress Note   [name]:[id number] DOB: Dec 05, 1960 DOA: 09/10/2023     40 DOS: the patient was seen and examined on 10/20/2023   Brief hospital course: ICU transfer for 10/08/2023.  Taken from prior notes.  Benjamin Bolton is a 63 y.o. male with medical history significant of COPD on 3L O2, HLD, pre-DM, DVT on Eliquis, who presents with SOB.  admitted with Acute on Chronic Hypoxic and Hypercapnic Respiratory Failure in the setting of Acute COPD Exacerbation due to RSV Pneumonia requiring intubation and mechanical ventilation on 09/13/2023.  Was successfully extubated on 1/27, currently on his baseline 3L nasal cannula, not requiring vasopressors.  Hospital course has been complicated questionable HAP and metabolic encephalopathy, which has limited his ability to participate with PT.  While he was Lucid after extubation, did make himself DNR/DNI in presence of family and chaplain.  He will remain full scope of medical care.  . Patient is chronically maintained on 20 mg of prednisone which will continue for now but will require a prolonged taper. He is refusing certain aspects of his care which is challenging given his overall comorbidities.   Initial PT recommendations for CIR which were downgraded to SNF due to his ability to participate with physical therapy.  Might need LTAC placement.  Echocardiogram done on 09/14/2023 with low normal EF and indeterminate diastolic parameters.  Troponin peaked at 115.  Did receive pressors intermittently while in ICU.  Cardiology was consulted which later signed off and no further cardiac evaluation indicated at this time.  There was a questionable superimposed HAP which was treated with antibiotics.  Multiple electrolyte abnormalities which include mild hyperkalemia, hyponatremia and hypophosphatemia which were resolved with repletion.  2/5: Vital stable with borderline soft blood pressure, saturating 100% on 2 L of oxygen, temperature of  100.4 recorded over the past 24-hour.  Labs with slight worsening of leukocytosis at 12.5, rest of the labs stable, CRP improving, CBG elevated. Sputum cultures from 1/22 with normal respiratory flora.  Patient is currently on Zosyn. Very muffled voice and weak cough.  Patient is quite debilitated and is appropriate for LTAC as recommended and PCCM note. Patient is on tube feed through NGT as having postintubation dysphagia.  2/6: Hemodynamically and labs seems stable, mild hypophosphatemia at 2.4.  Anemia panel consistent with anemia of chronic disease with low iron and saturation, ferritin elevated.  Had a bed offer at select LTAC, pending insurance authorization.  Patient is reluctant for PEG tube, stating that improvement in dysphagia and he will like to try more p.o. intake. Small improvement in voice and cough reflex.  Completed the course of Zosyn.  2/7: Hemodynamically stable, slowly improving voice and cough.  Improving CRP.  Pending insurance authorization for LTAC.  Still does not want PEG tube.  2/8: Oxycodone added at his request due to complaint of generalized bodyaches.  Worsening cough and leukocytosis today, repeating chest x-ray.  Ordered a repeat swallow evaluation. Patient just completed course of Zosyn.  2/9: Overnight quite agitated and unable to sleep.  Ativan helped.  Complaining of generalized aches and pain, appears to have very poor insight of his condition.  Labs with mild pseudohyponatremia secondary to hyperglycemia.  Per patient no appetite but p.o. intake slowly improving. Repeat chest x-ray with persistent but improving bilateral pneumonia. CRP again started trending up.  Continue to have some diarrhea, ordered CBC as add-on  2/10: LTAC placement was declined by insurance even with peer to peer review.  They want a definitive  feeding plan in place.  Patient with poor appetite but able to swallow now.  We will try giving him a break from NG tube, he was on a continuous  field.  Might pull the NG tube out and see if he can take adequate p.o. intake.  If he continued to need tube feeding then PEG will be the best option but patient does not want it.  2/11: Patient became febrile again overnight, maximum temperature recorded of 102.  CRP again started trending up, slight worsening of leukocytosis, repeat chest x-ray with increasing infiltrate.  Patient recently received multiple courses of antibiotic.  UA does not look infected.  Infectious disease was also consulted.  Blood, sputum and wound cultures ordered.  Starting on Zosyn Calorie count with p.o. intake started, tube feeding switched to bolus if he is unable to eat p.o. Appeal was made for [facility] decision.  2/12: Maximum temperature recorded over the past 24 hours was 100.2, remained tachycardic, worsening leukocytosis at 14.4, and CRP.  CT chest abdomen and pelvis with continued consolidation in lower lobes bilaterally concerning for pneumonia.  Mild gaseous distention of colon, may reflect mild ileus, repeat respiratory viral panel negative, cultures are pending. Patient agrees to PEG placement, IR was consulted and they are would like to wait until current fever and concern of infection is sorted out.  Patient also need 48 hours of Eliquis break before placement. LTAC appeal still pending  Patient is high risk for worsening comorbidities and mortality, palliative care was consulted.  2/13: Patient remained febrile, NG tube was removed today so he can try improving his p.o. intake, patient will not decide about PEG tube after p.o. challenge.  ID to decide about whether to add further antibiotics, worsening inflammatory markers.  Patient still not ready for hospice.  2/14 d/w pt and he agreed for PEG tube insertion, d/w POA sister and she agreed as well. Texted to IR to find out when we can schedule PEG tube insertion and how many days we need to hold Eliquis?  Awaiting for reply.   Assessment and Plan:  #  Acute on chronic respiratory failure with hypoxia (HCC) # COPD exacerbation-treated and resolved # RSV pneumonia-treated # Concern of superimposed HAP. S/p prolonged intubation with advanced underlying COPD. Currently saturating well on 2 L of oxygen which is his baseline. Postintubation muffled voice and weak cough. S/p Zosyn till 2/11 for concern of HAP although tracheal cultures with normal respiratory flora.  Inflammatory markers seems improving. -Continue supplemental oxygen -Incentive spirometry and flutter valve -Chest PT -Continue with supportive care -Repeat swallow evaluation due to worsening cough -Repeat chest x-ray-with persistent bilateral pneumonia  Fever Tam 101 on 2/12 Patient became febrile again with worsening leukocytosis and CRP.  Repeat chest x-ray with worsening infiltrate, ordered repeat blood, sputum and wound cultures.  Diarrhea has been resolved.  UA does not look infected.  Preliminary blood cultures negative, sputum cultures pending.  Legionella and TB QuantiFERON was also sent by ID CT chest, abdomen and pelvis with concern of bilateral basal opacities/pneumonia. ID consulted, s/p Zosyn, rec to follow off antibiotics. 2/11 blood culture NGTD, 2/11 RVP negative, 2/12 COVID-negative 2/12 RSV positive  # Suspected candidal esophagitis as per ID Started Diflucan 20 mg p.o. daily, continue for 2 weeks   # COPD exacerbation, resolved Patient is on prednisone 20 mg chronically which has been continued  # DVT (deep venous thrombosis) (HCC) Patient has history of DVT. 2/14 held Eliquis, started Lovenox for now due to  PEG tube placement on Monday   # Protein-calorie malnutrition, severe (HCC)  Swallowing much improved. Started calorie count as p.o. intake slowly improving, appetite remained poor and unable to meet his caloric requirement, losing weight.   NGT was removed today, IR was consulted yesterday for PEG tube placement when he agreed, today he again  saying that does not want PEG tube and wants to give himself a p.o. challenge. -Continue to monitor 2/14 agreed for PEG tube placement 2/14 held Eliquis, started Lovenox for now due to PEG tube placement on Monday IR consulted to keep him on his schedule for PEG tube on Monday  Hypotension, started midodrine 5 mg p.o. 3 times daily with holding parameters on 2/15   Electrolyte abnormality Patient had multiple electrolyte abnormalities during this complicated course of hospitalization which involved mild hyperkalemia, hyponatremia and hypophosphatemia which has been improved. Hypokalemia, potassium repleted. -Continue to monitor-replete as needed  Normochromic anemia Hemoglobin currently stable at 8.7.  Likely due to chronic disease. Anemia panel consistent with anemia of chronic disease -Monitor hemoglobin -Transfuse if below 7  Pre-diabetes A1c of 6.4, CBG currently elevated. Patient is on tube feed and steroid. -Continue with SSI  Elevated troponin Echocardiogram done on 09/14/2023 with 50 to 55% EF, indeterminate diastolic parameter.  Likely secondary to demand ischemia.  Troponin peaked at 115 Initially cardiology was consulted and later the signed off, no intervention required.  Diarrhea Improved.  No abdominal pain but to have mild leukocytosis. High risk for C. Difficile. -Continue with supportive care  # Iron deficiency, transferrin saturation 13%, started oral iron supplement with vitamin C.    Nutrition Documentation    Flowsheet Row ED to Hosp-Admission (Current) from 09/10/2023 in [facility]  Nutrition Problem Severe Malnutrition  Etiology acute illness  Nutrition Goal Patient will meet greater than or equal to 90% of their needs  Interventions Refer to RD note for recommendations     ,  Active Pressure Injury/Wound(s)     Pressure Ulcer  Duration          Pressure Injury 10/08/23 Sacrum Mid Unstageable - Full thickness tissue loss  in which the base of the injury is covered by slough (yellow, tan, gray, green or brown) and/or eschar (tan, brown or black) in the wound bed. 10 days             Subjective:  No significant overnight events, patient was lying comfortably, patient was hungry and was asking regarding PEG tube insertion, awaiting for IR for PEG tube placement.  Patient denied any other complaints, breathing is better now, no complaints    Physical Exam: Vitals:   10/20/23 0810 10/20/23 0935 10/20/23 1200 10/20/23 1300  BP:   104/68   Pulse:   83 86  Resp: 20  (!) 28 20  Temp:      TempSrc:      SpO2:   100% 100%  Weight:  54.5 kg    Height:       General.  Frail and severely malnourished gentleman, in no acute distress. Pulmonary.  Lungs clear bilaterally, normal respiratory effort. CV.  Regular rate and rhythm, no JVD, rub or murmur. Abdomen.  Soft, nontender, nondistended, BS positive. CNS.  Alert and oriented .  No focal neurologic deficit. Extremities.  No edema, no cyanosis, pulses intact and symmetrical.  Data Reviewed: Prior data reviewed  Family Communication:   Disposition: Status is: Inpatient Remains inpatient appropriate because: Severity of illness  Planned Discharge Destination:  SNF  DVT prophylaxis.  on Lovenox for now Time spent: 40 minutes  This record has been created using Conservation officer, historic buildings. Errors have been sought and corrected,but may not always be located. Such creation errors do not reflect on the standard of care.   Author: Gillis Santa, MD 10/20/2023 1:55 PM  For on call review [email or web address].

## 2023-10-20 NOTE — Progress Notes (Signed)
 MEWS Progress Note  Patient Details Name: Benjamin Bolton MRN: [id number] DOB: [birth year] Today's Date: 10/20/2023   MEWS Flowsheet Documentation:  Assess: MEWS Score Temp: 98.7 F (37.1 C) BP: 98/71 MAP (mmHg): 80 Pulse Rate: 88 ECG Heart Rate: 87 Resp: 20 Level of Consciousness: Alert SpO2: 97 % O2 Device: Room Air Patient Activity (if Appropriate): In bed Heater temperature: 35.6 F (2 C) O2 Flow Rate (L/min): 3 L/min FiO2 (%): 32 % Assess: MEWS Score MEWS Temp: 0 MEWS Systolic: 1 MEWS Pulse: 0 MEWS RR: 0 MEWS LOC: 0 MEWS Score: 1 MEWS Score Color: Green Assess: SIRS CRITERIA SIRS Temperature : 0 SIRS Respirations : 0 SIRS Pulse: 0 SIRS WBC: 0 SIRS Score Sum : 0 SIRS Temperature : 0 SIRS Pulse: 0 SIRS Respirations : 0 SIRS WBC: 0 SIRS Score Sum : 0 Assess: if the MEWS score is Yellow or Red Were vital signs accurate and taken at a resting state?: No, vital signs rechecked Does the patient meet 2 or more of the SIRS criteria?: No Does the patient have a confirmed or suspected source of infection?: No MEWS guidelines implemented : No, previously yellow, continue vital signs every 4 hours    Paitent's VS rechecked by this RN at bedside. Pt VSS within normal limits.     Adair Laundry 10/20/2023, 8:11 AM

## 2023-10-20 NOTE — Progress Notes (Signed)
 ID Sputum culture was rothia, streptoccus and capnocytophaga All oral organisms Pt has been adequately treated with antibiotics for fever  Fever resolved when prednisone dose was icnreased So no antibiotics needed for this now

## 2023-10-21 ENCOUNTER — Inpatient Hospital Stay: Payer: 59 | Admitting: Radiology

## 2023-10-21 DIAGNOSIS — J9621 Acute and chronic respiratory failure with hypoxia: Secondary | ICD-10-CM | POA: Diagnosis not present

## 2023-10-21 HISTORY — PX: IR GASTROSTOMY TUBE MOD SED: IMG625

## 2023-10-21 LAB — GLUCOSE, CAPILLARY
Glucose-Capillary: 117 mg/dL — ABNORMAL HIGH (ref 70–99)
Glucose-Capillary: 122 mg/dL — ABNORMAL HIGH (ref 70–99)
Glucose-Capillary: 139 mg/dL — ABNORMAL HIGH (ref 70–99)
Glucose-Capillary: 129 mg/dL — ABNORMAL HIGH (ref 70–99)

## 2023-10-21 LAB — CBC
HCT: 25 % — ABNORMAL LOW (ref 39.0–52.0)
Hemoglobin: 8.1 g/dL — ABNORMAL LOW (ref 13.0–17.0)
MCH: 29.8 pg (ref 26.0–34.0)
MCHC: 32.4 g/dL (ref 30.0–36.0)
MCV: 91.9 fL (ref 80.0–100.0)
Platelets: 393 10*3/uL (ref 150–400)
RBC: 2.72 MIL/uL — ABNORMAL LOW (ref 4.22–5.81)
RDW: 15.9 % — ABNORMAL HIGH (ref 11.5–15.5)
WBC: 10.2 10*3/uL (ref 4.0–10.5)
nRBC: 0.4 % — ABNORMAL HIGH (ref 0.0–0.2)

## 2023-10-21 LAB — BASIC METABOLIC PANEL
Anion gap: 11 (ref 5–15)
BUN: 14 mg/dL (ref 8–23)
CO2: 29 mmol/L (ref 22–32)
Calcium: 9.2 mg/dL (ref 8.9–10.3)
Chloride: 96 mmol/L — ABNORMAL LOW (ref 98–111)
Creatinine, Ser: 0.54 mg/dL — ABNORMAL LOW (ref 0.61–1.24)
GFR, Estimated: >60 mL/min (ref >60)
Glucose, Bld: 116 mg/dL — ABNORMAL HIGH (ref 70–99)
Potassium: 4.1 mmol/L (ref 3.5–5.1)
Sodium: 136 mmol/L (ref 135–145)

## 2023-10-21 LAB — PHOSPHORUS
Phosphorus: 3.2 mg/dL (ref 2.5–4.6)
Phosphorus: 3.2 mg/dL (ref 2.5–4.6)
Phosphorus: 3.3 mg/dL (ref 2.5–4.6)

## 2023-10-21 LAB — MAGNESIUM
Magnesium: 2 mg/dL (ref 1.7–2.4)
Magnesium: 2 mg/dL (ref 1.7–2.4)
Magnesium: 2.1 mg/dL (ref 1.7–2.4)

## 2023-10-21 MED ORDER — LIDOCAINE HCL 1 % IJ SOLN
10.0000 mL | Freq: Once | INTRAMUSCULAR | Status: AC
  Administered 2023-10-21: 10 mL via INTRADERMAL
  Filled 2023-10-21: qty 10

## 2023-10-21 MED ORDER — PROSOURCE TF20 ENFIT COMPATIBL EN LIQD
60.0000 mL | Freq: Every day | ENTERAL | Status: DC
  Administered 2023-10-22 – 2023-10-29 (×6): 60 mL
  Filled 2023-10-21 (×6): qty 60

## 2023-10-21 MED ORDER — LIDOCAINE VISCOUS HCL 2 % MT SOLN
OROMUCOSAL | Status: AC
  Filled 2023-10-21: qty 15

## 2023-10-21 MED ORDER — LIDOCAINE HCL 1 % IJ SOLN
INTRAMUSCULAR | Status: AC
Start: 2023-10-21 — End: ?
  Filled 2023-10-21: qty 20

## 2023-10-21 MED ORDER — IOHEXOL 300 MG/ML  SOLN
15.0000 mL | Freq: Once | INTRAMUSCULAR | Status: AC | PRN
  Administered 2023-10-21: 15 mL

## 2023-10-21 MED ORDER — APIXABAN 5 MG PO TABS: 5.0000 mg | ORAL_TABLET | Freq: Two times a day (BID) | ORAL | Status: DC

## 2023-10-21 MED ORDER — STERILE WATER FOR INJECTION IJ SOLN
INTRAMUSCULAR | Status: AC
  Filled 2023-10-21: qty 10

## 2023-10-21 MED ORDER — OSMOLITE 1.5 CAL PO LIQD
1000.0000 mL | ORAL | Status: DC
  Administered 2023-10-22 – 2023-10-26 (×4): 1000 mL

## 2023-10-21 MED ORDER — FENTANYL CITRATE (PF) 100 MCG/2ML IJ SOLN
INTRAMUSCULAR | Status: AC | PRN
  Administered 2023-10-21 (×2): 25 ug via INTRAVENOUS
  Administered 2023-10-21: 50 ug via INTRAVENOUS

## 2023-10-21 MED ORDER — LIDOCAINE VISCOUS HCL 2 % MT SOLN
15.0000 mL | Freq: Once | OROMUCOSAL | Status: AC
  Administered 2023-10-21: 5 mL via OROMUCOSAL
  Filled 2023-10-21: qty 15

## 2023-10-21 MED ORDER — STERILE WATER FOR INJECTION IJ SOLN
10.0000 mL | Freq: Once | INTRAMUSCULAR | Status: AC
  Administered 2023-10-21: 10 mL via INTRAMUSCULAR

## 2023-10-21 MED ORDER — GLUCAGON HCL RDNA (DIAGNOSTIC) 1 MG IJ SOLR
INTRAMUSCULAR | Status: AC | PRN
  Administered 2023-10-21: 1 mg via INTRAVENOUS

## 2023-10-21 MED ORDER — MIDAZOLAM HCL 2 MG/2ML IJ SOLN
INTRAMUSCULAR | Status: AC | PRN
  Administered 2023-10-21: .5 mg via INTRAVENOUS
  Administered 2023-10-21: 1 mg via INTRAVENOUS
  Administered 2023-10-21: .5 mg via INTRAVENOUS

## 2023-10-21 MED ORDER — APIXABAN 5 MG PO TABS
5.0000 mg | ORAL_TABLET | Freq: Two times a day (BID) | ORAL | Status: DC
  Administered 2023-10-22: 5 mg
  Filled 2023-10-21: qty 1

## 2023-10-21 MED ORDER — MIDAZOLAM HCL 2 MG/2ML IJ SOLN
INTRAMUSCULAR | Status: AC
  Filled 2023-10-21: qty 2

## 2023-10-21 MED ORDER — FREE WATER
30.0000 mL | Status: DC
  Administered 2023-10-22 – 2023-10-29 (×42): 30 mL

## 2023-10-21 MED ORDER — THIAMINE MONONITRATE 100 MG PO TABS
100.0000 mg | ORAL_TABLET | Freq: Every day | ORAL | Status: DC
  Administered 2023-10-22: 100 mg
  Filled 2023-10-21: qty 1

## 2023-10-21 MED ORDER — FENTANYL CITRATE (PF) 100 MCG/2ML IJ SOLN
INTRAMUSCULAR | Status: AC
Start: 2023-10-21 — End: ?
  Filled 2023-10-21: qty 2

## 2023-10-21 MED ORDER — GLUCAGON HCL RDNA (DIAGNOSTIC) 1 MG IJ SOLR
INTRAMUSCULAR | Status: AC
  Filled 2023-10-21: qty 1

## 2023-10-21 MED ORDER — VITAL HIGH PROTEIN PO LIQD: 1000.0000 mL | ORAL | Status: DC

## 2023-10-21 NOTE — Plan of Care (Signed)
   Problem: Education: Goal: Knowledge of General Education information will improve Description: Including pain rating scale, medication(s)/side effects and non-pharmacologic comfort measures Outcome: Progressing   Problem: Health Behavior/Discharge Planning: Goal: Ability to manage health-related needs will improve Outcome: Progressing   Problem: Clinical Measurements: Goal: Ability to maintain clinical measurements within normal limits will improve Outcome: Progressing Goal: Will remain free from infection Outcome: Progressing Goal: Diagnostic test results will improve Outcome: Progressing Goal: Respiratory complications will improve Outcome: Progressing Goal: Cardiovascular complication will be avoided Outcome: Progressing   Problem: Activity: Goal: Risk for activity intolerance will decrease Outcome: Progressing   Problem: Nutrition: Goal: Adequate nutrition will be maintained Outcome: Progressing   Problem: Coping: Goal: Level of anxiety will decrease Outcome: Progressing   Problem: Elimination: Goal: Will not experience complications related to bowel motility Outcome: Progressing Goal: Will not experience complications related to urinary retention Outcome: Progressing   Problem: Pain Management: Goal: General experience of comfort will improve Outcome: Progressing   Problem: Safety: Goal: Ability to remain free from injury will improve Outcome: Progressing   Problem: Skin Integrity: Goal: Risk for impaired skin integrity will decrease Outcome: Progressing   Problem: Education: Goal: Knowledge of disease or condition will improve Outcome: Progressing Goal: Knowledge of the prescribed therapeutic regimen will improve Outcome: Progressing   Problem: Activity: Goal: Ability to tolerate increased activity will improve Outcome: Progressing Goal: Will verbalize the importance of balancing activity with adequate rest periods Outcome: Progressing   Problem:  Respiratory: Goal: Ability to maintain a clear airway will improve Outcome: Progressing Goal: Levels of oxygenation will improve Outcome: Progressing Goal: Ability to maintain adequate ventilation will improve Outcome: Progressing   Problem: Education: Goal: Ability to describe self-care measures that may prevent or decrease complications (Diabetes Survival Skills Education) will improve Outcome: Progressing   Problem: Coping: Goal: Ability to adjust to condition or change in health will improve Outcome: Progressing   Problem: Fluid Volume: Goal: Ability to maintain a balanced intake and output will improve Outcome: Progressing   Problem: Health Behavior/Discharge Planning: Goal: Ability to identify and utilize available resources and services will improve Outcome: Progressing Goal: Ability to manage health-related needs will improve Outcome: Progressing   Problem: Metabolic: Goal: Ability to maintain appropriate glucose levels will improve Outcome: Progressing   Problem: Nutritional: Goal: Maintenance of adequate nutrition will improve Outcome: Progressing Goal: Progress toward achieving an optimal weight will improve Outcome: Progressing   Problem: Skin Integrity: Goal: Risk for impaired skin integrity will decrease Outcome: Progressing   Problem: Tissue Perfusion: Goal: Adequacy of tissue perfusion will improve Outcome: Progressing   Problem: Activity: Goal: Ability to tolerate increased activity will improve Outcome: Progressing   Problem: Respiratory: Goal: Ability to maintain a clear airway and adequate ventilation will improve Outcome: Progressing   Problem: Role Relationship: Goal: Method of communication will improve Outcome: Progressing

## 2023-10-21 NOTE — Progress Notes (Signed)
 Progress Note   [name] ZOX:[id number] DOB: [birth year] DOA: 09/10/2023     41 DOS: the patient was seen and examined on 10/21/2023   Brief hospital course: ICU transfer for 10/08/2023.  Taken from prior notes.  Ishmael Berkovich is a 63 y.o. male with medical history significant of COPD on 3L O2, HLD, pre-DM, DVT on Eliquis, who presents with SOB.  admitted with Acute on Chronic Hypoxic and Hypercapnic Respiratory Failure in the setting of Acute COPD Exacerbation due to RSV Pneumonia requiring intubation and mechanical ventilation on 09/13/2023.  Was successfully extubated on 1/27, currently on his baseline 3L nasal cannula, not requiring vasopressors.  Hospital course has been complicated questionable HAP and metabolic encephalopathy, which has limited his ability to participate with PT.  While he was Lucid after extubation, did make himself DNR/DNI in presence of family and chaplain.  He will remain full scope of medical care.  . Patient is chronically maintained on 20 mg of prednisone which will continue for now but will require a prolonged taper. He is refusing certain aspects of his care which is challenging given his overall comorbidities.   Initial PT recommendations for CIR which were downgraded to SNF due to his ability to participate with physical therapy.  Might need LTAC placement.  Echocardiogram done on 09/14/2023 with low normal EF and indeterminate diastolic parameters.  Troponin peaked at 115.  Did receive pressors intermittently while in ICU.  Cardiology was consulted which later signed off and no further cardiac evaluation indicated at this time.  There was a questionable superimposed HAP which was treated with antibiotics.  Multiple electrolyte abnormalities which include mild hyperkalemia, hyponatremia and hypophosphatemia which were resolved with repletion.  2/5: Vital stable with borderline soft blood pressure, saturating 100% on 2 L of oxygen, temperature of  100.4 recorded over the past 24-hour.  Labs with slight worsening of leukocytosis at 12.5, rest of the labs stable, CRP improving, CBG elevated. Sputum cultures from 1/22 with normal respiratory flora.  Patient is currently on Zosyn. Very muffled voice and weak cough.  Patient is quite debilitated and is appropriate for LTAC as recommended and PCCM note. Patient is on tube feed through NGT as having postintubation dysphagia.  2/6: Hemodynamically and labs seems stable, mild hypophosphatemia at 2.4.  Anemia panel consistent with anemia of chronic disease with low iron and saturation, ferritin elevated.  Had a bed offer at select LTAC, pending insurance authorization.  Patient is reluctant for PEG tube, stating that improvement in dysphagia and he will like to try more p.o. intake. Small improvement in voice and cough reflex.  Completed the course of Zosyn.  2/7: Hemodynamically stable, slowly improving voice and cough.  Improving CRP.  Pending insurance authorization for LTAC.  Still does not want PEG tube.  2/8: Oxycodone added at his request due to complaint of generalized bodyaches.  Worsening cough and leukocytosis today, repeating chest x-ray.  Ordered a repeat swallow evaluation. Patient just completed course of Zosyn.  2/9: Overnight quite agitated and unable to sleep.  Ativan helped.  Complaining of generalized aches and pain, appears to have very poor insight of his condition.  Labs with mild pseudohyponatremia secondary to hyperglycemia.  Per patient no appetite but p.o. intake slowly improving. Repeat chest x-ray with persistent but improving bilateral pneumonia. CRP again started trending up.  Continue to have some diarrhea, ordered CBC as add-on  2/10: LTAC placement was declined by insurance even with peer to peer review.  They want a definitive  feeding plan in place.  Patient with poor appetite but able to swallow now.  We will try giving him a break from NG tube, he was on a continuous  field.  Might pull the NG tube out and see if he can take adequate p.o. intake.  If he continued to need tube feeding then PEG will be the best option but patient does not want it.  2/11: Patient became febrile again overnight, maximum temperature recorded of 102.  CRP again started trending up, slight worsening of leukocytosis, repeat chest x-ray with increasing infiltrate.  Patient recently received multiple courses of antibiotic.  UA does not look infected.  Infectious disease was also consulted.  Blood, sputum and wound cultures ordered.  Starting on Zosyn Calorie count with p.o. intake started, tube feeding switched to bolus if he is unable to eat p.o. Appeal was made for [facility] decision.  2/12: Maximum temperature recorded over the past 24 hours was 100.2, remained tachycardic, worsening leukocytosis at 14.4, and CRP.  CT chest abdomen and pelvis with continued consolidation in lower lobes bilaterally concerning for pneumonia.  Mild gaseous distention of colon, may reflect mild ileus, repeat respiratory viral panel negative, cultures are pending. Patient agrees to PEG placement, IR was consulted and they are would like to wait until current fever and concern of infection is sorted out.  Patient also need 48 hours of Eliquis break before placement. LTAC appeal still pending  Patient is high risk for worsening comorbidities and mortality, palliative care was consulted.  2/13: Patient remained febrile, NG tube was removed today so he can try improving his p.o. intake, patient will not decide about PEG tube after p.o. challenge.  ID to decide about whether to add further antibiotics, worsening inflammatory markers.  Patient still not ready for hospice.  2/18 s/p PEG tube placement done by IR, resume Eliquis tomorrow p.m. dose after 24 hours as per pharmacy  Resumed PEG tube feeding after 4 hours of insertion.  Continue to monitor diet tolerance and discharge planning to LTAC when patient will be  excepted.   Assessment and Plan:  # Acute on chronic respiratory failure with hypoxia (HCC) # COPD exacerbation-treated and resolved # RSV pneumonia-treated # Concern of superimposed HAP. S/p prolonged intubation with advanced underlying COPD. Currently saturating well on 2 L of oxygen which is his baseline. Postintubation muffled voice and weak cough. S/p Zosyn till 2/11 for concern of HAP although tracheal cultures with normal respiratory flora.  Inflammatory markers seems improving. -Continue supplemental oxygen -Incentive spirometry and flutter valve -Chest PT -Continue with supportive care -Repeat swallow evaluation due to worsening cough -Repeat chest x-ray-with persistent bilateral pneumonia  Fever Tam 101 on 2/12 Patient became febrile again with worsening leukocytosis and CRP.  Repeat chest x-ray with worsening infiltrate, ordered repeat blood, sputum and wound cultures.  Diarrhea has been resolved.  UA does not look infected.  Preliminary blood cultures negative, sputum cultures pending.  Legionella and TB QuantiFERON was also sent by ID CT chest, abdomen and pelvis with concern of bilateral basal opacities/pneumonia. ID consulted, s/p Zosyn, rec to follow off antibiotics. 2/11 blood culture NGTD, 2/11 RVP negative, 2/12 COVID-negative 2/12 RSV positive  # Suspected candidal esophagitis as per ID Started Diflucan 200 mg p.o. daily, continue for 2 weeks   # COPD exacerbation, resolved Patient is on prednisone 20 mg chronically which has been continued  # DVT (deep venous thrombosis) (HCC) Patient has history of DVT. 2/14 held Eliquis, started Lovenox for now due  to PEG tube placement on Tuesday 2/18 2/18 resume Eliquis tomorrow p.m. dose as per pharmacy Continue SCDs for now   # Protein-calorie malnutrition, severe (HCC)  Swallowing much improved. Started calorie count as p.o. intake slowly improving, appetite remained poor and unable to meet his caloric  requirement, losing weight.   NGT was removed today, IR was consulted yesterday for PEG tube placement when he agreed, today he again saying that does not want PEG tube and wants to give himself a p.o. challenge. -Continue to monitor 2/14 agreed for PEG tube placement 2/14 held Eliquis, started Lovenox for now due to PEG tube placement on Monday 2/18 s/p PEG tube insertion, nutrition was consulted to start PEG tube feeding.  Follow tolerance  Hypotension, started midodrine 5 mg p.o. 3 times daily with holding parameters on 2/15   Electrolyte abnormality Patient had multiple electrolyte abnormalities during this complicated course of hospitalization which involved mild hyperkalemia, hyponatremia and hypophosphatemia which has been improved. Hypokalemia, potassium repleted. -Continue to monitor-replete as needed  Normochromic anemia Hemoglobin currently stable at 8.7.  Likely due to chronic disease. Anemia panel consistent with anemia of chronic disease -Monitor hemoglobin -Transfuse if below 7  Pre-diabetes A1c of 6.4, CBG currently elevated. Patient is on tube feed and steroid. -Continue with SSI  Elevated troponin Echocardiogram done on 09/14/2023 with 50 to 55% EF, indeterminate diastolic parameter.  Likely secondary to demand ischemia.  Troponin peaked at 115 Initially cardiology was consulted and later the signed off, no intervention required.  Diarrhea Improved.  No abdominal pain but to have mild leukocytosis. High risk for C. Difficile. -Continue with supportive care  # Iron deficiency, transferrin saturation 13%, started oral iron supplement with vitamin C.    Nutrition Documentation    Flowsheet Row ED to Hosp-Admission (Current) from 09/10/2023 in [facility]  Nutrition Problem Severe Malnutrition  Etiology acute illness  Nutrition Goal Patient will meet greater than or equal to 90% of their needs  Interventions Refer to RD note for  recommendations     ,  Active Pressure Injury/Wound(s)     Pressure Ulcer  Duration          Pressure Injury 10/08/23 Sacrum Mid Unstageable - Full thickness tissue loss in which the base of the injury is covered by slough (yellow, tan, gray, green or brown) and/or eschar (tan, brown or black) in the wound bed. 10 days             Subjective:  No significant overnight events, patient was resting calmly, he agreed with the PEG tube placement. Denied any complaints.    Physical Exam: Vitals:   10/21/23 1606 10/21/23 1608 10/21/23 1616 10/21/23 1630  BP: 121/83 121/83 105/78 111/72  Pulse: 88 (!) 0 85 84  Resp: (!) 22  18 18   Temp:      TempSrc:      SpO2: 98%  96% 98%  Weight:      Height:       General.  Frail and severely malnourished gentleman, in no acute distress. Pulmonary.  Lungs clear bilaterally, normal respiratory effort. CV.  Regular rate and rhythm, no JVD, rub or murmur. Abdomen.  Soft, nontender, nondistended, BS positive. CNS.  Alert and oriented .  No focal neurologic deficit. Extremities.  No edema, no cyanosis, pulses intact and symmetrical.  Data Reviewed: Prior data reviewed  Family Communication:   Disposition: Status is: Inpatient Remains inpatient appropriate because: Severity of illness  Planned Discharge Destination: SNF  DVT prophylaxis.  SCD for now, resume Lovenox tomorrow p.m. dose Time spent: 40 minutes  This record has been created using Conservation officer, historic buildings. Errors have been sought and corrected,but may not always be located. Such creation errors do not reflect on the standard of care.   Author: Gillis Santa, MD 10/21/2023 6:08 PM  For on call review [email or web address].

## 2023-10-21 NOTE — Procedures (Signed)
 Interventional Radiology Procedure Note  Procedure: Placement of percutaneous 89F balloon retention gastrostomy tube.  Complications: None  Recommendations: - Clears only remainder of today and overnight - Maintain G-tube to LWS x 2 hrs to assess for bleeding  - May advance diet as tolerated and begin using tube tomorrow morning  Signed,  Sterling Big, MD

## 2023-10-21 NOTE — Consult Note (Signed)
 WOC Nurse wound follow up Wound type: Unstageable Pressure Injury Measurement:1.5cm x 0.5cm x 0.1cm  Wound bed:100% yellow  Drainage (amount, consistency, odor) none Periwound: intact; re-epithelization at wound edges  Dressing procedure/placement/frequency: Continue Medihoney daily.  Patient understands to shift weight off of site.  For PEG tube today which should increase patient's nutritional status    WOC Nurse team will follow with you and see patient within 10 days for wound assessments.  Please notify WOC nurses of any acute changes in the wounds or any new areas of concern Nikkole Placzek [facility] MSN, RN,CWOCN, CNS, CWON-AP [phone]

## 2023-10-21 NOTE — Progress Notes (Signed)
 Nutrition Follow-up  DOCUMENTATION CODES:   Severe malnutrition in context of acute illness/injury  INTERVENTION:   -When PEG is placed:   Initiate Osmolite 1.5 @ 60 ml/hr and increase by 10 ml every 8 hours to goal rate of 60 ml/hr.    60 ml Prosource TF daily   30 ml free water flush every 4 hours   Tube feeding regimen provides 2240 kcal (100% of needs), 110 grams of protein, and 1097 ml of H2O.  Total free water: 1277 ml daily     -Continue Vitamin C 500mg  BID   -Pt at high refeed risk; recommend monitor potassium, magnesium and phosphorus labs daily until stable -Continue daily weights  -MVI with minerals daily -100 mg thiamine daily x 7 days  -When PEG is placed and tolerance to continuous feeds is demonstrated, can transition to bolus feeds if desired:    237 ml (1 carton) Osmolite 1.5 6 times daily   50 ml free water flush before and after each feeding administration   Tube feeding regimen provides 2130 kcal (100% of needs), 89 grams of protein, and 1086 ml of H2O. Total free water: 1686 ml daily  NUTRITION DIAGNOSIS:   Severe Malnutrition related to acute illness as evidenced by moderate fat depletion, severe fat depletion, moderate muscle depletion, severe muscle depletion.  Ongoing  GOAL:   Patient will meet greater than or equal to 90% of their needs  Unmet  MONITOR:   Diet advancement, Labs, Weight trends, TF tolerance, Skin, I & O's  REASON FOR ASSESSMENT:   Consult Enteral/tube feeding initiation and management  ASSESSMENT:   63 y/o male with h/o COPD, DVT, HLD, GERD, pulmonary nodule and DM who is admitted with RSV, pneumonia and COPD exacerbation.  1/27- extubated  1/29- s/p BSE- advanced to dysphagia 1 diet with thin liquids 2/3- NGT placed, TF started 2/9- s/p BSE- advanced to dysphagia 2 diet with thin liquids 2/10- s/p BSE- remain on dysphagia 2 diet with thin liquids 2/13- NGT removed  Reviewed I/O's: -500 ml x 24 hours and -8.3 L  since 10/07/23  UOP: 500 ml x 24 hours  Reviewed I/O's: -500 ml x 24 hours and -8.3 L since 10/07/23  Case discussed with MD and IR. Pt still requires PEG tube placement. MD discussed with pt on 10/17/23 and pt and sister agreed to PEG placement. Per IR, pt will be worked in for PEG placement today and received permission to start feeds 4 hours after placement.   Noted mild wt gain over the past week.   Medications reviewed and include vitamin C, diflucan, protonic, miralax, and prednisone.   Labs reviewed: CBGS: 117-201 (inpatient orders for glycemic control are 0-9 units insulin aspart before meals and bedtime).    Diet Order:   Diet Order             Diet NPO time specified  Diet effective midnight                   EDUCATION NEEDS:   Education needs have been addressed  Skin:  Skin Assessment: Skin Integrity Issues: Skin Integrity Issues:: Unstageable Stage II: - Unstageable: sacrum Other: IAD to buttocks  Last BM:  10/19/23 (type 6)  Height:   Ht Readings from Last 1 Encounters:  09/10/23 6' (1.829 m)    Weight:   Wt Readings from Last 1 Encounters:  10/21/23 52.1 kg    Ideal Body Weight:  80.9 kg  BMI:  Body mass index is  15.58 kg/m.  Estimated Nutritional Needs:   Kcal:  1900-2200kcal/day  Protein:  95-110g/day  Fluid:  1.8-2.1L/day    Levada Schilling, RD, LDN, CDCES Registered Dietitian III Certified Diabetes Care and Education Specialist If unable to reach this RD, please use "RD Inpatient" group chat on secure chat between hours of 8am-4 pm daily

## 2023-10-21 NOTE — Progress Notes (Signed)
 Patient clinically stable post IR PEG placement per Dr Archer Asa, tolerated well. Vitals stable pre and post procedure. Denies complaints at post procedure. Received Versed 2 mg along with Fentanyl 100 mcg IV for procedure. Report given to [facility] post procedure/specials/19.

## 2023-10-21 NOTE — Progress Notes (Addendum)
 Brief Pharmacy Note --- Post IR Procedure Consult Note  Re-start interrupted anticoagulation   Pharmacy was consulted by IR to restart anticoagulation and antiplatelet per protocol. Patient is on apixban 5 mg BID at home. Standard bleeding risk for placement of G-tube.   Communicated and MD agreed with plan to re-start apixaban per protocol starting 2/19 with evening dose.   Thank you for allowing pharmacy to participate in this patient's care.  Effie Shy, PharmD Pharmacy Resident  10/21/2023 8:22 PM

## 2023-10-22 DIAGNOSIS — Z515 Encounter for palliative care: Secondary | ICD-10-CM | POA: Diagnosis not present

## 2023-10-22 DIAGNOSIS — E43 Unspecified severe protein-calorie malnutrition: Secondary | ICD-10-CM | POA: Diagnosis not present

## 2023-10-22 DIAGNOSIS — J441 Chronic obstructive pulmonary disease with (acute) exacerbation: Secondary | ICD-10-CM | POA: Diagnosis not present

## 2023-10-22 DIAGNOSIS — J9621 Acute and chronic respiratory failure with hypoxia: Secondary | ICD-10-CM | POA: Diagnosis not present

## 2023-10-22 LAB — GLUCOSE, CAPILLARY
Glucose-Capillary: 110 mg/dL — ABNORMAL HIGH (ref 70–99)
Glucose-Capillary: 125 mg/dL — ABNORMAL HIGH (ref 70–99)
Glucose-Capillary: 114 mg/dL — ABNORMAL HIGH (ref 70–99)
Glucose-Capillary: 126 mg/dL — ABNORMAL HIGH (ref 70–99)
Glucose-Capillary: 155 mg/dL — ABNORMAL HIGH (ref 70–99)
Glucose-Capillary: 238 mg/dL — ABNORMAL HIGH (ref 70–99)
Glucose-Capillary: 138 mg/dL — ABNORMAL HIGH (ref 70–99)

## 2023-10-22 LAB — BASIC METABOLIC PANEL
Anion gap: 10 (ref 5–15)
BUN: 13 mg/dL (ref 8–23)
CO2: 27 mmol/L (ref 22–32)
Calcium: 9 mg/dL (ref 8.9–10.3)
Chloride: 98 mmol/L (ref 98–111)
Creatinine, Ser: 0.56 mg/dL — ABNORMAL LOW (ref 0.61–1.24)
GFR, Estimated: >60 mL/min (ref >60)
Glucose, Bld: 121 mg/dL — ABNORMAL HIGH (ref 70–99)
Potassium: 3.9 mmol/L (ref 3.5–5.1)
Sodium: 135 mmol/L (ref 135–145)

## 2023-10-22 LAB — MAGNESIUM
Magnesium: 2 mg/dL (ref 1.7–2.4)
Magnesium: 2.3 mg/dL (ref 1.7–2.4)

## 2023-10-22 LAB — CBC
HCT: 27.3 % — ABNORMAL LOW (ref 39.0–52.0)
Hemoglobin: 8.7 g/dL — ABNORMAL LOW (ref 13.0–17.0)
MCH: 29.6 pg (ref 26.0–34.0)
MCHC: 31.9 g/dL (ref 30.0–36.0)
MCV: 92.9 fL (ref 80.0–100.0)
Platelets: 405 10*3/uL — ABNORMAL HIGH (ref 150–400)
RBC: 2.94 MIL/uL — ABNORMAL LOW (ref 4.22–5.81)
RDW: 16.1 % — ABNORMAL HIGH (ref 11.5–15.5)
WBC: 11.3 10*3/uL — ABNORMAL HIGH (ref 4.0–10.5)
nRBC: 0.4 % — ABNORMAL HIGH (ref 0.0–0.2)

## 2023-10-22 LAB — PHOSPHORUS
Phosphorus: 4 mg/dL (ref 2.5–4.6)
Phosphorus: 3.6 mg/dL (ref 2.5–4.6)

## 2023-10-22 MED ORDER — JUVEN PO PACK
1.0000 | PACK | Freq: Two times a day (BID) | ORAL | Status: DC
  Administered 2023-10-23 – 2023-10-27 (×7): 1

## 2023-10-22 MED ORDER — ENSURE ENLIVE PO LIQD
237.0000 mL | Freq: Two times a day (BID) | ORAL | Status: DC
  Administered 2023-10-23 – 2023-10-29 (×11): 237 mL via ORAL

## 2023-10-22 NOTE — Progress Notes (Signed)
 Occupational Therapy Treatment Patient Details Name: Benjamin Bolton MRN: [id number] DOB: 1960-11-26 Today's Date: 10/22/2023   History of present illness 63 y.o. 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   OT comments  Pt is supine in bed on arrival. Pleasant, but only agreeable to bed level OT session d/t his abdominal soreness from PEG tube placement yesterday. Pt performed rolling in bed with supervision to have lotion applied to his back. He performed oral care and face washing with set up at bed level. Edu on BUE RROM HEP to be performed daily to maximize UB strength. Pt is seemingly self limiting. Edu on importance of getting OOB to maximize his strength. He was left in bed with all needs in place and will cont to require skilled acute OT services to maximize his safety and IND to return to PLOF.       If plan is discharge home, recommend the following:  Help with stairs or ramp for entrance;Assistance with cooking/housework;Assist for transportation;Assistance with feeding;A little help with walking and/or transfers;A lot of help with walking and/or transfers;A little help with bathing/dressing/bathroom   Equipment Recommendations  Other (comment) (defer)    Recommendations for Other Services      Precautions / Restrictions Precautions Precautions: Fall Recall of Precautions/Restrictions: Intact Restrictions Weight Bearing Restrictions Per Provider Order: No       Mobility Bed Mobility Overal bed mobility: Needs Assistance Bed Mobility: Rolling Rolling: Supervision, Used rails              Transfers                   General transfer comment: refused     Balance                                           ADL  either performed or assessed with clinical judgement   ADL Overall ADL's : Needs assistance/impaired     Grooming: Oral care;Wash/dry face;Set up;Bed level Grooming Details (indicate cue type and reason): refused EOB or OOB; performed with set up at bed level                               General ADL Comments: rolled in bed with SUP using bed rails for lotion to be applied to his back; refsued EOB or OOB today d/t abdominal soreness    Extremity/Trunk Assessment              Vision       Perception     Praxis     Communication     Cognition Arousal: Alert Behavior During Therapy: WFL for tasks assessed/performed                                 Following commands: Impaired Following commands impaired: Follows one step commands with increased time      Cueing   Cueing Techniques: Verbal cues, Tactile cues  Exercises Other Exercises Other Exercises: Edu on BUE RROM exercises to be performed at bed level using small fan in room as weight for  10 reps x 1 set to maximize UB strength/endurance.    Shoulder Instructions       General Comments      Pertinent Vitals/ Pain       Pain Assessment Pain Assessment: Faces Faces Pain Scale: Hurts even more Pain Location: abdomen from peg tube placement yesterday Pain Descriptors / Indicators: Discomfort Pain Intervention(s): Monitored during session, Repositioned  Home Living                                          Prior Functioning/Environment              Frequency  Min 1X/week        Progress Toward Goals  OT Goals(current goals can now be found in the care plan section)  Progress towards OT goals: Progressing toward goals  Acute Rehab OT Goals Patient Stated Goal: feel better OT Goal Formulation: With patient Time For Goal Achievement: 10/29/23 Potential to Achieve Goals: Fair  Plan      Co-evaluation                 AM-PAC OT "6 Clicks"  Daily Activity     Outcome Measure   Help from another person eating meals?: A Little Help from another person taking care of personal grooming?: A Little Help from another person toileting, which includes using toliet, bedpan, or urinal?: A Lot Help from another person bathing (including washing, rinsing, drying)?: A Lot Help from another person to put on and taking off regular upper body clothing?: A Little Help from another person to put on and taking off regular lower body clothing?: A Lot 6 Click Score: 15    End of Session Equipment Utilized During Treatment: Oxygen  OT Visit Diagnosis: Other abnormalities of gait and mobility (R26.89);Muscle weakness (generalized) (M62.81)   Activity Tolerance Patient tolerated treatment well   Patient Left in bed;with call bell/phone within reach;with bed alarm set   Nurse Communication Mobility status        Time: 0955-1010 OT Time Calculation (min): 15 min  Charges: OT General Charges $OT Visit: 1 Visit OT Treatments $Self Care/Home Management : 8-22 mins  Diesha Rostad, OTR/L  10/22/23, 10:30 AM   Constance Goltz 10/22/2023, 10:28 AM

## 2023-10-22 NOTE — Progress Notes (Signed)
 Palliative Care Progress Note, Assessment & Plan   Patient Name: Benjamin Bolton       Date: 10/22/2023 DOB: [birth year]  Age: 63 y.o. MRN#: [id number] Attending Physician: Gillis Santa, MD Primary Care Physician: [facility] Admit Date: 09/10/2023  Subjective: Is sitting up in bed watching TV in no apparent distress.  He acknowledges my presence and is able to make his wishes known.  He is alert and oriented x 4.  No family or friends present during my visit.  HPI: 63 y.o. 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  Patient has been successfully extubated, however has required NG tube for nutritional support.  He remains weak and intermittently compliant with PT/OT/medication regimen compliance.  Patient has been declined potential for admission to [facility] inpatient rehab. Pt accepted and auythorization in place for [facility].   Summary of counseling/coordination of care: Extensive chart review completed prior to meeting patient including labs, vital signs, imaging, progress notes, orders, and available advanced directive documents from current and previous encounters.   After reviewing the patient's chart and assessing the patient at bedside, I spoke with patient in regards to symptom management and goals of care.   Of note, it is patient's 63nd birthday today.  I wished him happy birthday.  He shares that he did not think he would be spending it this way.  He says he was hoping to be sipping on a beer.  Symptoms assessed.  Patient shares that he feels well but that he feels like he  is just not making progress.  He shares that he is trying but feels like things are going in reverse.  Space and opportunity provided for patient to share his thoughts and emotions regarding his current medical situation.  I highlighted that I have been following patient for the 41 days of this hospitalization.  I shared that he has recovered from ventilatory support, RSV pneumonia, and has shown drastic improvements in working with physical and occupational therapy.  Counseled that patient no longer requires extensive supplemental oxygen support.  However, he still has end-stage COPD with a chronic, progressive, and irreversible lung disease.  He shares that he knows he is showing some improvement but still feels like he has a long way to go.  Discussed recovery from significant loss of functional mobility as well as decline in nutritional status during this hospitalization.  He shares that he is trying to eat as much food as he can but that he does not like the food provided.  Discussed again that food is for fuel and not for fun.  He shares he is doing his best.  I again highlighted the importance of advance care planning and making patient's wishes known.  He remains in agreement to continue to treat the treatable.  DNR with limited interventions remains.  His sister remains his HCPOA.  No adjustment to plan of care or MAR needed at this time.  TOC is following closely [facility].  PMT will step  back from daily visits and monitor the patient peripherally.  Please reengage with PMT if goals change, at patient/family's request, or if patient's health deteriorates during this hospitalization.  Physical Exam Vitals reviewed.  Constitutional:      Comments: thin  HENT:     Head: Normocephalic.     Mouth/Throat:     Mouth: Mucous membranes are moist.  Eyes:     Pupils: Pupils are equal, round, and reactive to light.  Pulmonary:     Effort: Pulmonary effort is normal.   Abdominal:     Palpations: Abdomen is soft.  Musculoskeletal:     Comments: Generalized weakness  Skin:    General: Skin is warm and dry.  Neurological:     Mental Status: He is alert and oriented to person, place, and time.  Psychiatric:        Mood and Affect: Mood normal.        Behavior: Behavior normal.        Thought Content: Thought content normal.        Judgment: Judgment normal.             Total Time 35 minutes   Time spent includes: Detailed review of medical records (labs, imaging, vital signs), medically appropriate exam (mental status, respiratory, cardiac, skin), discussed with treatment team, counseling and educating patient, family and staff, documenting clinical information, medication management and coordination of care.  Samara Deist L. Bonita Quin, DNP, FNP-BC Palliative Medicine Team

## 2023-10-22 NOTE — Progress Notes (Addendum)
 Nutrition Follow-up  DOCUMENTATION CODES:   Severe malnutrition in context of acute illness/injury  INTERVENTION:   -Advance to dysphagia 2 diet -TF via PEG   Osmolite 1.5 @ 20 ml/hr and increase by 10 ml every 8 hours to goal rate of 60 ml/hr.    60 ml Prosource TF daily   30 ml free water flush every 4 hours   Tube feeding regimen provides 2240 kcal (100% of needs), 110 grams of protein, and 1097 ml of H2O.  Total free water: 1277 ml daily     -Continue Vitamin C 500mg  BID   -Pt at high refeed risk; recommend monitor potassium, magnesium and phosphorus labs daily until stable -Continue daily weights  -MVI with minerals daily -100 mg thiamine daily x 7 days  -1 packet Juven BID via tube, each packet provides 95 calories, 2.5 grams of protein (collagen), and 9.8 grams of carbohydrate (3 grams sugar); also contains 7 grams of L-arginine and L-glutamine, 300 mg vitamin C, 15 mg vitamin E, 1.2 mcg vitamin B-12, 9.5 mg zinc, 200 mg calcium, and 1.5 g  Calcium Beta-hydroxy-Beta-methylbutyrate to support wound healing  -When PEG is placed and tolerance to continuous feeds is demonstrated, can transition to bolus feeds if desired:    237 ml (1 carton) Osmolite 1.5 6 times daily   50 ml free water flush before and after each feeding administration   Tube feeding regimen provides 2130 kcal (100% of needs), 89 grams of protein, and 1086 ml of H2O. Total free water: 1686 ml daily   NUTRITION DIAGNOSIS:   Severe Malnutrition related to acute illness as evidenced by moderate fat depletion, severe fat depletion, moderate muscle depletion, severe muscle depletion.  Ongoing  GOAL:   Patient will meet greater than or equal to 90% of their needs  Progressing   MONITOR:   Diet advancement, Labs, Weight trends, TF tolerance, Skin, I & O's  REASON FOR ASSESSMENT:   Consult Enteral/tube feeding initiation and management  ASSESSMENT:   63 y/o male with h/o COPD, DVT, HLD, GERD,  pulmonary nodule and DM who is admitted with RSV, pneumonia and COPD exacerbation.  1/27- extubated  1/29- s/p BSE- advanced to dysphagia 1 diet with thin liquids 2/3- NGT placed, TF started 2/9- s/p BSE- advanced to dysphagia 2 diet with thin liquids 2/10- s/p BSE- remain on dysphagia 2 diet with thin liquids 2/13- NGT removed 2/18- PEG placed  Reviewed I/O's: -650 ml x 24 hours and -7.7 L since 10/08/23  UOP: 650 ml x 24 hours  Spoke with pt at bedside, who reports not feeling well today. Pt expressed frustration over prolonged hospitalization and is upset that he is spending his birthday (today) in the hospital. RD provided emotional support. He also reports he does not like the foods he is provided here in the hospital ("how can I get better if I'm not eating?"). Noted pt consumed some sodas and ice pops. He does not like the dysphagia 2 diet and wishes he had more expanded food options. RD discussed rationale for dysphagia 2 diet; pt acknowledges that he has experienced dysphagia in the past and often choked on foods such as collard greens and ground beef PTA, so he tried to avoid them. He also shares that his father suffered from dysphagia and choked on bacon, so he also avoids that. RD affirmed pt concerns and discussed importance of dysphagia 2 diet due to management for dysphagia and pt safety. Of note, the last time pt was evaluated for  swallowing he still had NGT in. RD discussed possibility to evaluate swallowing again to see if diet can be advanced, however, for pt's safety, SLP wound need to re-evaluate given pt's history of dysphagia.   Noted pt had PEG placed yesterday. Pt reports that he is concerned about PEG placement as he wanted to avoid it. RD discussed calorie count results, concern of weight loss in hospital, and importance of nutrition to help pt improve. RD discussed how pt will still be able to eat with PEG feedings and how nutrition plan/ feedings can be altered based on  pt's progress with oral intake. Pt expressed appreciation for information.   Per IR, feeding can be started today.  Case discussed with RN, SLP, and MD. MD did agree with RD for SLP evaluation. Per discussion with SLP, no plan to upgrade diet at this time and pt will stay on dysphagia 2 diet for remainder of hospital stay.   Wt has been stable over the past week.  Per TOC notes, plan for LTACH vs SNF placement.   Labs reviewed: Mg, K, and Phos WDL. CBGS: 114-155 (inpatient orders for glycemic control are none).    Diet Order:   Diet Order             Diet clear liquid Room service appropriate? Yes; Fluid consistency: Thin  Diet effective now                   EDUCATION NEEDS:   Education needs have been addressed  Skin:  Skin Assessment: Skin Integrity Issues: Skin Integrity Issues:: Unstageable Stage II: - Unstageable: sacrum Other: IAD to buttocks  Last BM:  10/20/23  Height:   Ht Readings from Last 1 Encounters:  09/10/23 6' (1.829 m)    Weight:   Wt Readings from Last 1 Encounters:  10/22/23 52 kg    Ideal Body Weight:  80.9 kg  BMI:  Body mass index is 15.55 kg/m.  Estimated Nutritional Needs:   Kcal:  1900-2200kcal/day  Protein:  95-110g/day  Fluid:  1.8-2.1L/day    Levada Schilling, RD, LDN, CDCES Registered Dietitian III Certified Diabetes Care and Education Specialist If unable to reach this RD, please use "RD Inpatient" group chat on secure chat between hours of 8am-4 pm daily

## 2023-10-22 NOTE — [place] FL2 (Signed)
 [place] MEDICAID FL2 LEVEL OF CARE FORM     IDENTIFICATION  Patient Name: Benjamin Bolton Birthdate: 09/11/1960 Sex: male Admission Date (Current Location): 09/10/2023  [place] and [place] Number:  Chiropodist and Address:  [facility], [address], [place], [place] [address]      Provider Number: [id number]  Attending Physician Name and Address:  Gillis Santa, MD  Relative Name and Phone Number:       Current Level of Care: Hospital Recommended Level of Care: Skilled Nursing Facility Prior Approval Number:    Date Approved/Denied:   PASRR Number: [id number]  Discharge Plan: SNF    Current Diagnoses: Patient Active Problem List   Diagnosis Date Noted   Fever 10/14/2023   Diarrhea 10/12/2023   Electrolyte abnormality 10/08/2023   Normochromic anemia 10/08/2023   Elevated troponin 10/08/2023   Pre-diabetes    Malnutrition of moderate degree 09/15/2023   COPD exacerbation (HCC) 09/10/2023   Protein-calorie malnutrition, severe (HCC) 09/10/2023   Acute on chronic respiratory failure with hypoxia (HCC) 09/10/2023   UTI (urinary tract infection) 09/10/2023   DVT (deep venous thrombosis) (HCC)    Tobacco abuse    HLD (hyperlipidemia)    Status post right rotator cuff repair 02/18/2023    Orientation RESPIRATION BLADDER Height & Weight     Self, Time, Situation, Place  O2 (Nasal Cannula 3 L. Bipap discontinued 2/11.) Incontinent, External catheter Weight: 114 lb 10.2 oz (52 kg) Height:  6' (182.9 cm)  BEHAVIORAL SYMPTOMS/MOOD NEUROLOGICAL BOWEL NUTRITION STATUS   (None)  (None) Continent Diet, Feeding tube (Clear liquids. PEG tube placed 2/18.)  AMBULATORY STATUS COMMUNICATION OF NEEDS Skin   Extensive Assist Verbally Skin abrasions, Bruising, Other (Comment) (Erythema/redness. 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 (foam every 3 days).)                       Personal Care Assistance Level of Assistance  Bathing,  Feeding, Dressing Bathing Assistance: Limited assistance Feeding assistance: Limited assistance Dressing Assistance: Limited assistance     Functional Limitations Info  Sight, Hearing, Speech Sight Info: Adequate Hearing Info: Adequate Speech Info: Adequate (Whispers)    SPECIAL CARE FACTORS FREQUENCY  PT (By licensed PT), OT (By licensed OT)     PT Frequency: 5 x week OT Frequency: 5 x week            Contractures Contractures Info: Not present    Additional Factors Info  Code Status, Allergies, Isolation Precautions Code Status Info: DNR Allergies Info: NKDA     Isolation Precautions Info: Droplet: Positive for RSV on 1/9     Current Medications (10/22/2023):  This is the current hospital active medication list Current Facility-Administered Medications  Medication Dose Route Frequency Provider Last Rate Last Admin   acetaminophen (TYLENOL) suppository 650 mg  650 mg Rectal Q4H PRN Rust-Chester, Cecelia Byars, NP   650 mg at 09/29/23 2153   acetaminophen (TYLENOL) tablet 650 mg  650 mg Oral Q6H PRN Otelia Sergeant, RPH   650 mg at 10/16/23 0815   apixaban (ELIQUIS) tablet 5 mg  5 mg Per Tube BID Effie Shy, RPH       ascorbic acid (VITAMIN C) tablet 500 mg  500 mg Oral BID Otelia Sergeant, RPH   500 mg at 10/22/23 0848   dextrose 50 % solution 50 mL  1 ampule Intravenous PRN Andris Baumann, MD       feeding supplement (OSMOLITE 1.5  CAL) liquid 1,000 mL  1,000 mL Per Tube Continuous Gillis Santa, MD 60 mL/hr at 10/22/23 1157 1,000 mL at 10/22/23 1157   feeding supplement (PROSource TF20) liquid 60 mL  60 mL Per Tube Daily Gillis Santa, MD   60 mL at 10/22/23 0859   fluconazole (DIFLUCAN) tablet 200 mg  200 mg Oral Daily Aleda Grana, RPH   200 mg at 10/22/23 4540   free water 30 mL  30 mL Per Tube Q4H Gillis Santa, MD   30 mL at 10/22/23 1157   hydrocortisone (ANUSOL-HC) suppository 25 mg  25 mg Rectal BID PRN Arnetha Courser, MD       insulin aspart (novoLOG)  injection 0-9 Units  0-9 Units Subcutaneous TID AC & HS Gillis Santa, MD   2 Units at 10/22/23 1156   ipratropium-albuterol (DUONEB) 0.5-2.5 (3) MG/3ML nebulizer solution 3 mL  3 mL Nebulization Q6H PRN Gillis Santa, MD       iron polysaccharides (NIFEREX) capsule 150 mg  150 mg Oral Daily Gillis Santa, MD   150 mg at 10/22/23 9811   leptospermum manuka honey (MEDIHONEY) paste 1 Application  1 Application Topical Daily Arnetha Courser, MD   1 Application at 10/22/23 0853   loperamide (IMODIUM) capsule 2 mg  2 mg Oral PRN Arnetha Courser, MD   2 mg at 10/10/23 0315   LORazepam (ATIVAN) injection 0.5 mg  0.5 mg Intravenous Q6H PRN Arnetha Courser, MD   0.5 mg at 10/21/23 2107   menthol-cetylpyridinium (CEPACOL) lozenge 3 mg  1 lozenge Oral PRN Arnetha Courser, MD   3 mg at 10/15/23 0902   midodrine (PROAMATINE) tablet 5 mg  5 mg Oral TID WC Gillis Santa, MD   5 mg at 10/22/23 1156   multivitamin with minerals tablet 1 tablet  1 tablet Oral Daily Otelia Sergeant, RPH   1 tablet at 10/22/23 9147   Muscle Rub CREA 1 Application  1 Application Topical PRN Arnetha Courser, MD   1 Application at 10/15/23 2107   nicotine (NICODERM CQ - dosed in mg/24 hours) patch 14 mg  14 mg Transdermal Daily Arnetha Courser, MD   14 mg at 10/22/23 0853   ondansetron (ZOFRAN) injection 4 mg  4 mg Intravenous Q8H PRN Lorretta Harp, MD       Oral care mouth rinse  15 mL Mouth Rinse 4 times per day Arnetha Courser, MD   15 mL at 10/21/23 2144   Oral care mouth rinse  15 mL Mouth Rinse PRN Arnetha Courser, MD       oxyCODONE (Oxy IR/ROXICODONE) immediate release tablet 5 mg  5 mg Oral Q4H PRN Arnetha Courser, MD   5 mg at 10/22/23 1102   pantoprazole (PROTONIX) injection 40 mg  40 mg Intravenous QHS Lowella Bandy, RPH   40 mg at 10/20/23 2140   polyethylene glycol (MIRALAX / GLYCOLAX) packet 17 g  17 g Oral Daily Arnetha Courser, MD   17 g at 10/22/23 0849   polyvinyl alcohol (LIQUIFILM TEARS) 1.4 % ophthalmic solution 1 drop  1 drop Both Eyes  PRN Erin Fulling, MD   1 drop at 09/29/23 1614   predniSONE (DELTASONE) tablet 20 mg  20 mg Oral Q breakfast Arnetha Courser, MD   20 mg at 10/22/23 0848   sodium chloride flush (NS) 0.9 % injection 10-40 mL  10-40 mL Intracatheter Q12H Vida Rigger, MD   10 mL at 10/22/23 0850   sodium chloride flush (NS) 0.9 % injection  10-40 mL  10-40 mL Intracatheter PRN Vida Rigger, MD   10 mL at 09/13/23 2151   thiamine (VITAMIN B1) tablet 100 mg  100 mg Per Tube Daily Gillis Santa, MD   100 mg at 10/22/23 4782     Discharge Medications: Please see discharge summary for a list of discharge medications.  Relevant Imaging Results:  Relevant Lab Results:   Additional Information SS#: [id number]  Margarito Liner, LCSW

## 2023-10-22 NOTE — TOC Progression Note (Addendum)
 Transition of Care [facility]) - Progression Note    Patient Details  Name: Benjamin Bolton MRN: [id number] Date of Birth: 1961-08-17  Transition of Care [facility] CM/SW Contact  Margarito Liner, LCSW Phone Number: 10/22/2023, 10:30 AM  Clinical Narrative:   [facility] is confirming that patient still meets LTACH criteria.  11:37 am: Patient does still meet LTACH criteria due to sacral wound. [facility] has started insurance authorization.  1:22 pm: CSW met with patient and provided update. Discussed SNF if insurance denies LTACH again. He is agreeable. Patient stated he needed LTC placement but he does not have Medicaid. He also wants to have his car with him. CSW explained he likely would not be allowed to drive if he is a long-term resident in a SNF. Patient was living with a friend prior to admission but he is unsure if that friend still lives there. CSW encouraged patient to discuss post-rehab plan with his sister.  Expected Discharge Plan: Long Term Acute Care (LTAC)    Expected Discharge Plan and Services                                               Social Determinants of Health (SDOH) Interventions SDOH Screenings   Food Insecurity: No Food Insecurity (09/12/2023)  Recent Concern: Food Insecurity - Food Insecurity Present (08/25/2023)   Received from [facility]  Housing: Unknown (09/12/2023)  Transportation Needs: No Transportation Needs (09/12/2023)  Recent Concern: Transportation Needs - Unmet Transportation Needs (08/25/2023)   Received from [facility]  Utilities: Not At Risk (09/12/2023)  Recent Concern: Utilities - At Risk (07/23/2023)   Received from [facility]  Financial Resource Strain: Low Risk  (08/25/2023)   [facility] from [facility]  Recent Concern: Financial Resource Strain - Medium Risk (07/23/2023)   Received from [facility]  Physical Activity:  Insufficiently Active (08/25/2023)   [facility] from [facility]  Social Connections: Socially Isolated (08/25/2023)   Received from [facility]  Stress: No Stress Concern Present (08/25/2023)   Received from [facility]  Tobacco Use: High Risk (09/25/2023)  Health Literacy: Inadequate Health Literacy (08/25/2023)   Received from [facility]    Readmission Risk Interventions     No data to display

## 2023-10-22 NOTE — Progress Notes (Signed)
 PT Cancellation Note  Patient Details Name: Benjamin Bolton MRN: [id number] DOB: Jul 05, 1961   Cancelled Treatment:    Reason Eval/Treat Not Completed: Patient declined, no reason specified (patient declined PT, stating his abdomen was too painful after procedure yesterday. Agreeable to PT coming back tomorrow. Plan to re-attempt at later time/date as appropriate.)   Luretha Murphy. Ilsa Iha, PT, DPT 10/22/23, 4:38 PM  [facility] [address] [place], [place] [address] P: [phone] I F: [phone]

## 2023-10-22 NOTE — Plan of Care (Signed)
   Problem: Education: Goal: Knowledge of General Education information will improve Description: Including pain rating scale, medication(s)/side effects and non-pharmacologic comfort measures Outcome: Progressing   Problem: Health Behavior/Discharge Planning: Goal: Ability to manage health-related needs will improve Outcome: Progressing   Problem: Clinical Measurements: Goal: Ability to maintain clinical measurements within normal limits will improve Outcome: Progressing Goal: Will remain free from infection Outcome: Progressing Goal: Diagnostic test results will improve Outcome: Progressing Goal: Respiratory complications will improve Outcome: Progressing Goal: Cardiovascular complication will be avoided Outcome: Progressing   Problem: Activity: Goal: Risk for activity intolerance will decrease Outcome: Progressing   Problem: Nutrition: Goal: Adequate nutrition will be maintained Outcome: Progressing   Problem: Coping: Goal: Level of anxiety will decrease Outcome: Progressing   Problem: Elimination: Goal: Will not experience complications related to bowel motility Outcome: Progressing Goal: Will not experience complications related to urinary retention Outcome: Progressing   Problem: Pain Management: Goal: General experience of comfort will improve Outcome: Progressing   Problem: Safety: Goal: Ability to remain free from injury will improve Outcome: Progressing   Problem: Skin Integrity: Goal: Risk for impaired skin integrity will decrease Outcome: Progressing   Problem: Education: Goal: Knowledge of disease or condition will improve Outcome: Progressing Goal: Knowledge of the prescribed therapeutic regimen will improve Outcome: Progressing   Problem: Activity: Goal: Ability to tolerate increased activity will improve Outcome: Progressing Goal: Will verbalize the importance of balancing activity with adequate rest periods Outcome: Progressing   Problem:  Respiratory: Goal: Ability to maintain a clear airway will improve Outcome: Progressing Goal: Levels of oxygenation will improve Outcome: Progressing Goal: Ability to maintain adequate ventilation will improve Outcome: Progressing   Problem: Education: Goal: Ability to describe self-care measures that may prevent or decrease complications (Diabetes Survival Skills Education) will improve Outcome: Progressing   Problem: Coping: Goal: Ability to adjust to condition or change in health will improve Outcome: Progressing   Problem: Fluid Volume: Goal: Ability to maintain a balanced intake and output will improve Outcome: Progressing   Problem: Health Behavior/Discharge Planning: Goal: Ability to identify and utilize available resources and services will improve Outcome: Progressing Goal: Ability to manage health-related needs will improve Outcome: Progressing   Problem: Metabolic: Goal: Ability to maintain appropriate glucose levels will improve Outcome: Progressing   Problem: Nutritional: Goal: Maintenance of adequate nutrition will improve Outcome: Progressing Goal: Progress toward achieving an optimal weight will improve Outcome: Progressing   Problem: Skin Integrity: Goal: Risk for impaired skin integrity will decrease Outcome: Progressing   Problem: Tissue Perfusion: Goal: Adequacy of tissue perfusion will improve Outcome: Progressing   Problem: Activity: Goal: Ability to tolerate increased activity will improve Outcome: Progressing   Problem: Respiratory: Goal: Ability to maintain a clear airway and adequate ventilation will improve Outcome: Progressing   Problem: Role Relationship: Goal: Method of communication will improve Outcome: Progressing

## 2023-10-22 NOTE — Progress Notes (Signed)
 Progress Note   [name] WUJ:[id number] DOB: 1961-08-03 DOA: 09/10/2023     42 DOS: the patient was seen and examined on 10/22/2023   Brief hospital course: ICU transfer for 10/08/2023.  Taken from prior notes.  Benjamin Bolton is a 63 y.o. male with medical history significant of COPD on 3L O2, HLD, pre-DM, DVT on Eliquis, who presents with SOB.  admitted with Acute on Chronic Hypoxic and Hypercapnic Respiratory Failure in the setting of Acute COPD Exacerbation due to RSV Pneumonia requiring intubation and mechanical ventilation on 09/13/2023.  Was successfully extubated on 1/27, currently on his baseline 3L nasal cannula, not requiring vasopressors.  Hospital course has been complicated questionable HAP and metabolic encephalopathy, which has limited his ability to participate with PT.  While he was Lucid after extubation, did make himself DNR/DNI in presence of family and chaplain.  He will remain full scope of medical care.  . Patient is chronically maintained on 20 mg of prednisone which will continue for now but will require a prolonged taper. He is refusing certain aspects of his care which is challenging given his overall comorbidities.   Initial PT recommendations for CIR which were downgraded to SNF due to his ability to participate with physical therapy.  Might need LTAC placement.  Echocardiogram done on 09/14/2023 with low normal EF and indeterminate diastolic parameters.  Troponin peaked at 115.  Did receive pressors intermittently while in ICU.  Cardiology was consulted which later signed off and no further cardiac evaluation indicated at this time.  There was a questionable superimposed HAP which was treated with antibiotics.  Multiple electrolyte abnormalities which include mild hyperkalemia, hyponatremia and hypophosphatemia which were resolved with repletion.  2/5: Vital stable with borderline soft blood pressure, saturating 100% on 2 L of oxygen, temperature of  100.4 recorded over the past 24-hour.  Labs with slight worsening of leukocytosis at 12.5, rest of the labs stable, CRP improving, CBG elevated. Sputum cultures from 1/22 with normal respiratory flora.  Patient is currently on Zosyn. Very muffled voice and weak cough.  Patient is quite debilitated and is appropriate for LTAC as recommended and PCCM note. Patient is on tube feed through NGT as having postintubation dysphagia.  2/6: Hemodynamically and labs seems stable, mild hypophosphatemia at 2.4.  Anemia panel consistent with anemia of chronic disease with low iron and saturation, ferritin elevated.  Had a bed offer at [facility] LT[facility], pending insurance authorization.  Patient is reluctant for PEG tube, stating that improvement in dysphagia and he will like to try more p.o. intake. Small improvement in voice and cough reflex.  Completed the course of Zosyn.  2/7: Hemodynamically stable, slowly improving voice and cough.  Improving CRP.  Pending insurance authorization for LTAC.  Still does not want PEG tube.  2/8: Oxycodone added at his request due to complaint of generalized bodyaches.  Worsening cough and leukocytosis today, repeating chest x-ray.  Ordered a repeat swallow evaluation. Patient just completed course of Zosyn.  2/9: Overnight quite agitated and unable to sleep.  Ativan helped.  Complaining of generalized aches and pain, appears to have very poor insight of his condition.  Labs with mild pseudohyponatremia secondary to hyperglycemia.  Per patient no appetite but p.o. intake slowly improving. Repeat chest x-ray with persistent but improving bilateral pneumonia. CRP again started trending up.  Continue to have some diarrhea, ordered CBC as add-on  2/10: LTAC placement was declined by insurance even with peer to peer review.  They want a definitive  feeding plan in place.  Patient with poor appetite but able to swallow now.  We will try giving him a break from NG tube, he was on a continuous  field.  Might pull the NG tube out and see if he can take adequate p.o. intake.  If he continued to need tube feeding then PEG will be the best option but patient does not want it.  2/11: Patient became febrile again overnight, maximum temperature recorded of 102.  CRP again started trending up, slight worsening of leukocytosis, repeat chest x-ray with increasing infiltrate.  Patient recently received multiple courses of antibiotic.  UA does not look infected.  Infectious disease was also consulted.  Blood, sputum and wound cultures ordered.  Starting on Zosyn Calorie count with p.o. intake started, tube feeding switched to bolus if he is unable to eat p.o. Appeal was made for [facility] decision.  2/12: Maximum temperature recorded over the past 24 hours was 100.2, remained tachycardic, worsening leukocytosis at 14.4, and CRP.  CT chest abdomen and pelvis with continued consolidation in lower lobes bilaterally concerning for pneumonia.  Mild gaseous distention of colon, may reflect mild ileus, repeat respiratory viral panel negative, cultures are pending. Patient agrees to PEG placement, IR was consulted and they are would like to wait until current fever and concern of infection is sorted out.  Patient also need 48 hours of Eliquis break before placement. LTAC appeal still pending  Patient is high risk for worsening comorbidities and mortality, palliative care was consulted.  2/13: Patient remained febrile, NG tube was removed today so he can try improving his p.o. intake, patient will not decide about PEG tube after p.o. challenge.  ID to decide about whether to add further antibiotics, worsening inflammatory markers.  Patient still not ready for hospice.  2/19 s/p PEG tube placement done by IR on 2/18, resume Eliquis today p.m. dose after 24 hours as per pharmacy  Resumed PEG tube feeding after 4 hours of insertion.  Continue to monitor diet tolerance and discharge planning to LTAC when patient will be  accepted.   Assessment and Plan:  # Acute on chronic respiratory failure with hypoxia (HCC) # COPD exacerbation-treated and resolved # RSV pneumonia-treated # Concern of superimposed HAP. S/p prolonged intubation with advanced underlying COPD. Currently saturating well on 2 L of oxygen which is his baseline. Postintubation muffled voice and weak cough. S/p Zosyn till 2/11 for concern of HAP although tracheal cultures with normal respiratory flora.  Inflammatory markers seems improving. -Continue supplemental oxygen -Incentive spirometry and flutter valve -Chest PT -Continue with supportive care -Repeat swallow evaluation due to worsening cough -Repeat chest x-ray-with persistent bilateral pneumonia  Fever Tam 101 on 2/12 Patient became febrile again with worsening leukocytosis and CRP.  Repeat chest x-ray with worsening infiltrate, ordered repeat blood, sputum and wound cultures.  Diarrhea has been resolved.  UA does not look infected.  Preliminary blood cultures negative, sputum cultures pending.  Legionella and TB QuantiFERON was also sent by ID CT chest, abdomen and pelvis with concern of bilateral basal opacities/pneumonia. ID consulted, s/p Zosyn, rec to follow off antibiotics. 2/11 blood culture NGTD, 2/11 RVP negative, 2/12 COVID-negative 2/12 RSV positive  # Suspected candidal esophagitis as per ID Started Diflucan 200 mg p.o. daily, continue for 2 weeks   # COPD exacerbation, resolved Patient is on prednisone 20 mg chronically which has been continued  # DVT (deep venous thrombosis) (HCC) Patient has history of DVT. 2/14 held Eliquis, started Lovenox for  now due to PEG tube placement on Tuesday 2/18 2/19 resume Eliquis today p.m. dose as per pharmacy   # Protein-calorie malnutrition, severe (HCC)  Swallowing much improved. Started calorie count as p.o. intake slowly improving, appetite remained poor and unable to meet his caloric requirement, losing weight.   NGT  was removed today, IR was consulted yesterday for PEG tube placement when he agreed, today he again saying that does not want PEG tube and wants to give himself a p.o. challenge. -Continue to monitor 2/14 agreed for PEG tube placement 2/14 held Eliquis, started Lovenox for now due to PEG tube placement on Monday 2/18 s/p PEG tube insertion, RD consulted for PEG tube feeding.   Hypotension, started midodrine 5 mg p.o. 3 times daily with holding parameters on 2/15   Electrolyte abnormality Patient had multiple electrolyte abnormalities during this complicated course of hospitalization which involved mild hyperkalemia, hyponatremia and hypophosphatemia which has been improved. Hypokalemia, potassium repleted. -Continue to monitor-replete as needed  Normochromic anemia Hemoglobin currently stable at 8.7.  Likely due to chronic disease. Anemia panel consistent with anemia of chronic disease -Monitor hemoglobin -Transfuse if below 7  Pre-diabetes A1c of 6.4, CBG currently elevated. Patient is on tube feed and steroid. -Continue with SSI  Elevated troponin Echocardiogram done on 09/14/2023 with 50 to 55% EF, indeterminate diastolic parameter.  Likely secondary to demand ischemia.  Troponin peaked at 115 Initially cardiology was consulted and later the signed off, no intervention required.  Diarrhea Improved.  No abdominal pain but to have mild leukocytosis. High risk for C. Difficile. -Continue with supportive care  # Iron deficiency, transferrin saturation 13%, started oral iron supplement with vitamin C.    Nutrition Documentation    Flowsheet Row ED to Hosp-Admission (Current) from 09/10/2023 in [facility]U  Nutrition Problem Severe Malnutrition  Etiology acute illness  Nutrition Goal Patient will meet greater than or equal to 90% of their needs  Interventions Refer to RD note for recommendations     ,  Active Pressure Injury/Wound(s)     Pressure  Ulcer  Duration          Pressure Injury 10/08/23 Sacrum Mid Unstageable - Full thickness tissue loss in which the base of the injury is covered by slough (yellow, tan, gray, green or brown) and/or eschar (tan, brown or black) in the wound bed. 10 days             Subjective:  No significant overnight events, in the morning time patient was having severe pain 10/10 at the PEG tube site which improved to 8/10 after pain medication. Patient denied any chest pain or palpitation, no shortness of breath, no any other complaints  Physical Exam: Vitals:   10/21/23 1616 10/21/23 1630 10/22/23 0354 10/22/23 0634  BP: 105/78 111/72    Pulse: 85 84    Resp: 18 18    Temp:   98.7 F (37.1 C)   TempSrc:      SpO2: 96% 98%    Weight:    52 kg  Height:       General.  Frail and severely malnourished gentleman, in no acute distress. Pulmonary.  Lungs clear bilaterally, normal respiratory effort. CV.  Regular rate and rhythm, no JVD, rub or murmur. Abdomen.  Soft, nontender, nondistended, BS positive.  PEG tube intact CNS.  Alert and oriented .  No focal neurologic deficit. Extremities.  No edema, no cyanosis, pulses intact and symmetrical.  Data Reviewed: Prior data reviewed  Family Communication: None available at bedside  Disposition: Status is: Inpatient Remains inpatient appropriate because: Severity of illness  Planned Discharge Destination: SNF versus LTAC  DVT prophylaxis.  Eliquis Time spent: 40 minutes  This record has been created using Conservation officer, historic buildings. Errors have been sought and corrected,but may not always be located. Such creation errors do not reflect on the standard of care.   Author: Gillis Santa, MD 10/22/2023 3:48 PM  For on call review [email or web address].

## 2023-10-23 DIAGNOSIS — J9621 Acute and chronic respiratory failure with hypoxia: Secondary | ICD-10-CM | POA: Diagnosis not present

## 2023-10-23 LAB — BASIC METABOLIC PANEL
Anion gap: 9 (ref 5–15)
BUN: 17 mg/dL (ref 8–23)
CO2: 30 mmol/L (ref 22–32)
Calcium: 9.3 mg/dL (ref 8.9–10.3)
Chloride: 98 mmol/L (ref 98–111)
Creatinine, Ser: 0.58 mg/dL — ABNORMAL LOW (ref 0.61–1.24)
GFR, Estimated: >60 mL/min (ref >60)
Glucose, Bld: 194 mg/dL — ABNORMAL HIGH (ref 70–99)
Potassium: 4.2 mmol/L (ref 3.5–5.1)
Sodium: 137 mmol/L (ref 135–145)

## 2023-10-23 LAB — GLUCOSE, CAPILLARY
Glucose-Capillary: 160 mg/dL — ABNORMAL HIGH (ref 70–99)
Glucose-Capillary: 173 mg/dL — ABNORMAL HIGH (ref 70–99)
Glucose-Capillary: 184 mg/dL — ABNORMAL HIGH (ref 70–99)
Glucose-Capillary: 225 mg/dL — ABNORMAL HIGH (ref 70–99)
Glucose-Capillary: 163 mg/dL — ABNORMAL HIGH (ref 70–99)

## 2023-10-23 LAB — MAGNESIUM: Magnesium: 2 mg/dL (ref 1.7–2.4)

## 2023-10-23 LAB — CBC
HCT: 27.7 % — ABNORMAL LOW (ref 39.0–52.0)
Hemoglobin: 8.7 g/dL — ABNORMAL LOW (ref 13.0–17.0)
MCH: 29.4 pg (ref 26.0–34.0)
MCHC: 31.4 g/dL (ref 30.0–36.0)
MCV: 93.6 fL (ref 80.0–100.0)
Platelets: 399 10*3/uL (ref 150–400)
RBC: 2.96 MIL/uL — ABNORMAL LOW (ref 4.22–5.81)
RDW: 16.1 % — ABNORMAL HIGH (ref 11.5–15.5)
WBC: 11 10*3/uL — ABNORMAL HIGH (ref 4.0–10.5)
nRBC: 0 % (ref 0.0–0.2)

## 2023-10-23 LAB — PHOSPHORUS: Phosphorus: 3.2 mg/dL (ref 2.5–4.6)

## 2023-10-23 MED ORDER — APIXABAN 5 MG PO TABS
5.0000 mg | ORAL_TABLET | Freq: Two times a day (BID) | ORAL | Status: DC
  Administered 2023-10-23 – 2023-10-29 (×13): 5 mg via ORAL
  Filled 2023-10-23 (×13): qty 1

## 2023-10-23 MED ORDER — GUAIFENESIN ER 600 MG PO TB12
600.0000 mg | ORAL_TABLET | Freq: Two times a day (BID) | ORAL | Status: DC
  Administered 2023-10-23 – 2023-10-29 (×13): 600 mg via ORAL
  Filled 2023-10-23 (×13): qty 1

## 2023-10-23 MED ORDER — THIAMINE MONONITRATE 100 MG PO TABS
100.0000 mg | ORAL_TABLET | Freq: Every day | ORAL | Status: AC
  Administered 2023-10-23 – 2023-10-28 (×6): 100 mg via ORAL
  Filled 2023-10-23 (×6): qty 1

## 2023-10-23 NOTE — Progress Notes (Signed)
 Progress Note   [name] GEX:[id number] DOB: 1961/08/06 DOA: 09/10/2023     43 DOS: the patient was seen and examined on 10/23/2023   Brief hospital course: ICU transfer for 10/08/2023.  Taken from prior notes.  Benjamin Bolton is a 63 y.o. male with medical history significant of COPD on 3L O2, HLD, pre-DM, DVT on Eliquis, who presents with SOB.  admitted with Acute on Chronic Hypoxic and Hypercapnic Respiratory Failure in the setting of Acute COPD Exacerbation due to RSV Pneumonia requiring intubation and mechanical ventilation on 09/13/2023.  Was successfully extubated on 1/27, currently on his baseline 3L nasal cannula, not requiring vasopressors.  Hospital course has been complicated questionable HAP and metabolic encephalopathy, which has limited his ability to participate with PT.  While he was Lucid after extubation, did make himself DNR/DNI in presence of family and chaplain.  He will remain full scope of medical care.  . Patient is chronically maintained on 20 mg of prednisone which will continue for now but will require a prolonged taper. He is refusing certain aspects of his care which is challenging given his overall comorbidities.   Initial PT recommendations for CIR which were downgraded to SNF due to his ability to participate with physical therapy.  Might need LTAC placement.  Echocardiogram done on 09/14/2023 with low normal EF and indeterminate diastolic parameters.  Troponin peaked at 115.  Did receive pressors intermittently while in ICU.  Cardiology was consulted which later signed off and no further cardiac evaluation indicated at this time.  There was a questionable superimposed HAP which was treated with antibiotics.  Multiple electrolyte abnormalities which include mild hyperkalemia, hyponatremia and hypophosphatemia which were resolved with repletion.  2/5: Vital stable with borderline soft blood pressure, saturating 100% on 2 L of oxygen, temperature of  100.4 recorded over the past 24-hour.  Labs with slight worsening of leukocytosis at 12.5, rest of the labs stable, CRP improving, CBG elevated. Sputum cultures from 1/22 with normal respiratory flora.  Patient is currently on Zosyn. Very muffled voice and weak cough.  Patient is quite debilitated and is appropriate for LTAC as recommended and PCCM note. Patient is on tube feed through NGT as having postintubation dysphagia.  2/6: Hemodynamically and labs seems stable, mild hypophosphatemia at 2.4.  Anemia panel consistent with anemia of chronic disease with low iron and saturation, ferritin elevated.  Had a bed offer at select LTAC, pending insurance authorization.  Patient is reluctant for PEG tube, stating that improvement in dysphagia and he will like to try more p.o. intake. Small improvement in voice and cough reflex.  Completed the course of Zosyn.  2/7: Hemodynamically stable, slowly improving voice and cough.  Improving CRP.  Pending insurance authorization for LTAC.  Still does not want PEG tube.  2/8: Oxycodone added at his request due to complaint of generalized bodyaches.  Worsening cough and leukocytosis today, repeating chest x-ray.  Ordered a repeat swallow evaluation. Patient just completed course of Zosyn.  2/9: Overnight quite agitated and unable to sleep.  Ativan helped.  Complaining of generalized aches and pain, appears to have very poor insight of his condition.  Labs with mild pseudohyponatremia secondary to hyperglycemia.  Per patient no appetite but p.o. intake slowly improving. Repeat chest x-ray with persistent but improving bilateral pneumonia. CRP again started trending up.  Continue to have some diarrhea, ordered CBC as add-on  2/10: LTAC placement was declined by insurance even with peer to peer review.  They want a definitive  feeding plan in place.  Patient with poor appetite but able to swallow now.  We will try giving him a break from NG tube, he was on a continuous  field.  Might pull the NG tube out and see if he can take adequate p.o. intake.  If he continued to need tube feeding then PEG will be the best option but patient does not want it.  2/11: Patient became febrile again overnight, maximum temperature recorded of 102.  CRP again started trending up, slight worsening of leukocytosis, repeat chest x-ray with increasing infiltrate.  Patient recently received multiple courses of antibiotic.  UA does not look infected.  Infectious disease was also consulted.  Blood, sputum and wound cultures ordered.  Starting on Zosyn Calorie count with p.o. intake started, tube feeding switched to bolus if he is unable to eat p.o. Appeal was made for [facility] decision.  2/12: Maximum temperature recorded over the past 24 hours was 100.2, remained tachycardic, worsening leukocytosis at 14.4, and CRP.  CT chest abdomen and pelvis with continued consolidation in lower lobes bilaterally concerning for pneumonia.  Mild gaseous distention of colon, may reflect mild ileus, repeat respiratory viral panel negative, cultures are pending. Patient agrees to PEG placement, IR was consulted and they are would like to wait until current fever and concern of infection is sorted out.  Patient also need 48 hours of Eliquis break before placement. LTAC appeal still pending  Patient is high risk for worsening comorbidities and mortality, palliative care was consulted.  2/13: Patient remained febrile, NG tube was removed today so he can try improving his p.o. intake, patient will not decide about PEG tube after p.o. challenge.  ID to decide about whether to add further antibiotics, worsening inflammatory markers.  Patient still not ready for hospice.  2/20 s/p PEG tube placement done by IR on 2/18, resume Eliquis on 2/19 p.m. dose after 24 hours as per pharmacy. Resumed PEG tube feeding after 4 hours of insertion.   P2P done, LTAC declined and recommended SNF placement when patient is able to  participate with PT. Informed to TOC    Assessment and Plan:  # Acute on chronic respiratory failure with hypoxia (HCC) # COPD exacerbation-treated and resolved # RSV pneumonia-treated # Concern of superimposed HAP. S/p prolonged intubation with advanced underlying COPD. Currently saturating well on 2 L of oxygen which is his baseline. Postintubation muffled voice and weak cough. S/p Zosyn till 2/11 for concern of HAP although tracheal cultures with normal respiratory flora.  Inflammatory markers seems improving. -Continue supplemental oxygen -Incentive spirometry and flutter valve -Chest PT -Continue with supportive care -Repeat swallow evaluation due to worsening cough -Repeat chest x-ray-with persistent bilateral pneumonia 2/20 started Mucinex extremity gram p.o. twice daily  Fever Tam 101 on 2/12 Patient became febrile again with worsening leukocytosis and CRP.  Repeat chest x-ray with worsening infiltrate, ordered repeat blood, sputum and wound cultures.  Diarrhea has been resolved.  UA does not look infected.  Preliminary blood cultures negative, sputum cultures pending.  Legionella and TB QuantiFERON was also sent by ID CT chest, abdomen and pelvis with concern of bilateral basal opacities/pneumonia. ID consulted, s/p Zosyn, rec to follow off antibiotics. 2/11 blood culture NGTD, 2/11 RVP negative, 2/12 COVID-negative 2/12 RSV positive  # Suspected candidal esophagitis as per ID Started Diflucan 200 mg p.o. daily, continue for 2 weeks   # COPD exacerbation, resolved Patient is on prednisone 20 mg chronically which has been continued  # DVT (deep  venous thrombosis) (HCC) Patient has history of DVT. 2/14 held Eliquis, started Lovenox for now due to PEG tube placement on Tuesday 2/18 2/19 resume Eliquis today p.m. dose as per pharmacy   # Protein-calorie malnutrition, severe (HCC)  Swallowing much improved. Started calorie count as p.o. intake slowly improving,  appetite remained poor and unable to meet his caloric requirement, losing weight.   NGT was removed today, IR was consulted yesterday for PEG tube placement when he agreed, today he again saying that does not want PEG tube and wants to give himself a p.o. challenge. -Continue to monitor 2/14 agreed for PEG tube placement 2/14 held Eliquis, started Lovenox for now due to PEG tube placement on Monday 2/18 s/p PEG tube insertion, RD consulted for PEG tube feeding.   Hypotension, started midodrine 5 mg p.o. 3 times daily with holding parameters on 2/15   Electrolyte abnormality Patient had multiple electrolyte abnormalities during this complicated course of hospitalization which involved mild hyperkalemia, hyponatremia and hypophosphatemia which has been improved. Hypokalemia, potassium repleted. -Continue to monitor-replete as needed  Normochromic anemia Hemoglobin currently stable at 8.7.  Likely due to chronic disease. Anemia panel consistent with anemia of chronic disease -Monitor hemoglobin -Transfuse if below 7  Pre-diabetes A1c of 6.4, CBG currently elevated. Patient is on tube feed and steroid. -Continue with SSI  Elevated troponin Echocardiogram done on 09/14/2023 with 50 to 55% EF, indeterminate diastolic parameter.  Likely secondary to demand ischemia.  Troponin peaked at 115 Initially cardiology was consulted and later the signed off, no intervention required.  Diarrhea Improved.  No abdominal pain but to have mild leukocytosis. High risk for C. Difficile. -Continue with supportive care  # Iron deficiency, transferrin saturation 13%, started oral iron supplement with vitamin C.    Nutrition Documentation    Flowsheet Row ED to Hosp-Admission (Current) from 09/10/2023 in [facility]  Nutrition Problem Severe Malnutrition  Etiology acute illness  Nutrition Goal Patient will meet greater than or equal to 90% of their needs  Interventions Refer to  RD note for recommendations     ,  Active Pressure Injury/Wound(s)     Pressure Ulcer  Duration          Pressure Injury 10/08/23 Sacrum Mid Unstageable - Full thickness tissue loss in which the base of the injury is covered by slough (yellow, tan, gray, green or brown) and/or eschar (tan, brown or black) in the wound bed. 10 days             Subjective:  No significant overnight events, having cough with phlegm production, patient is still having significant pain in the PEG tube area 10/10, pain goes down to 6/10 after pain medications.  Patient was also feeling some nausea but he is still trying to eat orally.  PEG tube feeding is going on. Denied any chest pain or palpitations, no shortness of breath.  No any other active issues  Physical Exam: Vitals:   10/23/23 0602 10/23/23 0759 10/23/23 0928 10/23/23 1157  BP:  108/80  107/75  Pulse:  93    Resp: 20 18  19   Temp:  98.1 F (36.7 C)  98.3 F (36.8 C)  TempSrc:  Oral  Oral  SpO2:  99%    Weight:   51 kg   Height:       General.  Frail and severely malnourished gentleman, in no acute distress. Pulmonary.  Equal air entry bilaterally, bilateral crackles, no wheezes CV.  Regular rate  and rhythm, no JVD, rub or murmur. Abdomen.  Soft, nontender, nondistended, BS positive.  PEG tube intact CNS.  Alert and oriented .  No focal neurologic deficit. Extremities.  No edema, no cyanosis, pulses intact and symmetrical.  Data Reviewed: Prior data reviewed  Family Communication: None available at bedside  Disposition: Status is: Inpatient Remains inpatient appropriate because: Severity of illness  Planned Discharge Destination: SNF versus LTAC  DVT prophylaxis.  Eliquis Time spent: 55 minutes  This record has been created using Conservation officer, historic buildings. Errors have been sought and corrected,but may not always be located. Such creation errors do not reflect on the standard of care.   Author: Gillis Santa,  MD 10/23/2023 1:56 PM  For on call review [email or web address].

## 2023-10-23 NOTE — Plan of Care (Signed)
  Problem: Clinical Measurements: Goal: Respiratory complications will improve Outcome: Not Progressing   Problem: Activity: Goal: Risk for activity intolerance will decrease Outcome: Not Progressing   Problem: Nutrition: Goal: Adequate nutrition will be maintained Outcome: Not Progressing   Problem: Pain Management: Goal: General experience of comfort will improve Outcome: Not Progressing   Problem: Skin Integrity: Goal: Risk for impaired skin integrity will decrease Outcome: Not Progressing

## 2023-10-23 NOTE — Progress Notes (Signed)
 The bed is malfunction to take weight. It can't even re zero out. Patient refused to get get up to change bed. Will pass this information to day shift to swap bed out when PT gets him up.

## 2023-10-23 NOTE — Progress Notes (Signed)
 Nutrition Follow-up  DOCUMENTATION CODES:   Severe malnutrition in context of acute illness/injury  INTERVENTION:   -Continue dysphagia 2 diet -TF via PEG   Osmolite 1.5 @ 45 ml/hr and increase by 10 ml every 8 hours to goal rate of 60 ml/hr.    60 ml Prosource TF daily   30 ml free water flush every 4 hours   Tube feeding regimen provides 2240 kcal (100% of needs), 110 grams of protein, and 1097 ml of H2O.  Total free water: 1277 ml daily     -Continue Vitamin C 500mg  BID   -Pt at high refeed risk; recommend monitor potassium, magnesium and phosphorus labs daily until stable -Continue daily weights  -Continue MVI with minerals daily -Continue 100 mg thiamine daily x 7 days  -Continue 1 packet Juven BID via tube, each packet provides 95 calories, 2.5 grams of protein (collagen), and 9.8 grams of carbohydrate (3 grams sugar); also contains 7 grams of L-arginine and L-glutamine, 300 mg vitamin C, 15 mg vitamin E, 1.2 mcg vitamin B-12, 9.5 mg zinc, 200 mg calcium, and 1.5 g  Calcium Beta-hydroxy-Beta-methylbutyrate to support wound healing  -When PEG is placed and tolerance to continuous feeds is demonstrated, can transition to bolus feeds if desired:    237 ml (1 carton) Osmolite 1.5 6 times daily   50 ml free water flush before and after each feeding administration   Tube feeding regimen provides 2130 kcal (100% of needs), 89 grams of protein, and 1086 ml of H2O. Total free water: 1686 ml daily     NUTRITION DIAGNOSIS:   Severe Malnutrition related to acute illness as evidenced by moderate fat depletion, severe fat depletion, moderate muscle depletion, severe muscle depletion.  Ongoing  GOAL:   Patient will meet greater than or equal to 90% of their needs  Progressing   MONITOR:   Diet advancement, Labs, Weight trends, TF tolerance, Skin, I & O's  REASON FOR ASSESSMENT:   Consult Enteral/tube feeding initiation and management  ASSESSMENT:   63 y/o male with  h/o COPD, DVT, HLD, GERD, pulmonary nodule and DM who is admitted with RSV, pneumonia and COPD exacerbation.  1/27- extubated  1/29- s/p BSE- advanced to dysphagia 1 diet with thin liquids 2/3- NGT placed, TF started 2/9- s/p BSE- advanced to dysphagia 2 diet with thin liquids 2/10- s/p BSE- remain on dysphagia 2 diet with thin liquids 2/13- NGT removed 2/18- PEG placed 2/19- TF initiated  Reviewed I/O's: +150 ml x 24 hours and -6.7 L since 10/08/24  Pt remains with minimal intake. PEG placed on 10/21/23 due to prolonged poor oral intake. Osmolite currently infusing @ 45 ml/hr.   Per discussion with SLP yesterday, no plan for further diet advancement.   Wt has been stable over the past week.   Per TOC notes, plan for LTACH vs SNF placement.    Medications reviewed and include vitamin C, diflucan, protonix, miralax, prednisonr, and thiamine.  Labs reviewed: CBGS: 160-184 (inpatient orders for glycemic control are 0-9 units insulin aspart TID before meals and at bedtime).    Diet Order:   Diet Order             DIET DYS 2 Room service appropriate? Yes; Fluid consistency: Thin  Diet effective now                   EDUCATION NEEDS:   Education needs have been addressed  Skin:  Skin Assessment: Skin Integrity Issues: Skin Integrity Issues:: Unstageable  Stage II: - Unstageable: sacrum Other: IAD to buttocks  Last BM:  10/20/23  Height:   Ht Readings from Last 1 Encounters:  09/10/23 6' (1.829 m)    Weight:   Wt Readings from Last 1 Encounters:  10/23/23 51 kg    Ideal Body Weight:  80.9 kg  BMI:  Body mass index is 15.25 kg/m.  Estimated Nutritional Needs:   Kcal:  1900-2200kcal/day  Protein:  95-110g/day  Fluid:  1.8-2.1L/day    Levada Schilling, RD, LDN, CDCES Registered Dietitian III Certified Diabetes Care and Education Specialist If unable to reach this RD, please use "RD Inpatient" group chat on secure chat between hours of 8am-4 pm daily

## 2023-10-23 NOTE — Progress Notes (Signed)
 PT Cancellation Note  Patient Details Name: Benjamin Bolton MRN: [id number] DOB: 1961-03-30   Cancelled Treatment:     Pt resting in bed after nursing assisted pt to [facility]. Currently c/o significant pain and unable to tolerate further activity. Will re-attempt next available date/time per POC.    Jannet Askew 10/23/2023, 5:43 PM

## 2023-10-24 DIAGNOSIS — J9621 Acute and chronic respiratory failure with hypoxia: Secondary | ICD-10-CM | POA: Diagnosis not present

## 2023-10-24 LAB — BASIC METABOLIC PANEL
Anion gap: 11 (ref 5–15)
BUN: 19 mg/dL (ref 8–23)
CO2: 32 mmol/L (ref 22–32)
Calcium: 9.4 mg/dL (ref 8.9–10.3)
Chloride: 93 mmol/L — ABNORMAL LOW (ref 98–111)
Creatinine, Ser: 0.62 mg/dL (ref 0.61–1.24)
GFR, Estimated: >60 mL/min (ref >60)
Glucose, Bld: 121 mg/dL — ABNORMAL HIGH (ref 70–99)
Potassium: 4.5 mmol/L (ref 3.5–5.1)
Sodium: 136 mmol/L (ref 135–145)

## 2023-10-24 LAB — GLUCOSE, CAPILLARY
Glucose-Capillary: 136 mg/dL — ABNORMAL HIGH (ref 70–99)
Glucose-Capillary: 155 mg/dL — ABNORMAL HIGH (ref 70–99)
Glucose-Capillary: 156 mg/dL — ABNORMAL HIGH (ref 70–99)
Glucose-Capillary: 169 mg/dL — ABNORMAL HIGH (ref 70–99)
Glucose-Capillary: 180 mg/dL — ABNORMAL HIGH (ref 70–99)
Glucose-Capillary: 213 mg/dL — ABNORMAL HIGH (ref 70–99)

## 2023-10-24 LAB — CBC
HCT: 27.6 % — ABNORMAL LOW (ref 39.0–52.0)
Hemoglobin: 8.7 g/dL — ABNORMAL LOW (ref 13.0–17.0)
MCH: 29.4 pg (ref 26.0–34.0)
MCHC: 31.5 g/dL (ref 30.0–36.0)
MCV: 93.2 fL (ref 80.0–100.0)
Platelets: 411 10*3/uL — ABNORMAL HIGH (ref 150–400)
RBC: 2.96 MIL/uL — ABNORMAL LOW (ref 4.22–5.81)
RDW: 15.8 % — ABNORMAL HIGH (ref 11.5–15.5)
WBC: 11.9 10*3/uL — ABNORMAL HIGH (ref 4.0–10.5)
nRBC: 0.2 % (ref 0.0–0.2)

## 2023-10-24 MED ORDER — OXYCODONE HCL 5 MG PO TABS
5.0000 mg | ORAL_TABLET | ORAL | Status: DC | PRN
  Administered 2023-10-24 – 2023-10-25 (×3): 10 mg via ORAL
  Administered 2023-10-25 – 2023-10-26 (×4): 5 mg via ORAL
  Administered 2023-10-27 – 2023-10-28 (×6): 10 mg via ORAL
  Filled 2023-10-24 (×5): qty 2
  Filled 2023-10-24 (×2): qty 1
  Filled 2023-10-24: qty 2
  Filled 2023-10-24 (×2): qty 1
  Filled 2023-10-24 (×4): qty 2

## 2023-10-24 NOTE — TOC Progression Note (Addendum)
 Transition of Care [facility]) - Progression Note    Patient Details  Name: Benjamin Bolton MRN: [id number] Date of Birth: 1960/09/07  [facility] CM/SW Contact  Margarito Liner, LCSW Phone Number: 10/24/2023, 12:21 PM  Clinical Narrative:  [facility] and [facility] offered a bed. CSW left the admissions coordinators messages to confirm they could apply for Medicaid and transition to LTC if needed. [name] is considering pending bed availability. They do not currently have any male beds right now.  2:25 pm: [facility] is checking to see if they can still offer. No response yet from [employer]. CSW extended search.  Expected Discharge Plan: Long Term Acute Care (LTAC)    Expected Discharge Plan and Services                                               Social Determinants of Health (SDOH) Interventions SDOH Screenings   Food Insecurity: No Food Insecurity (09/12/2023)  Recent Concern: Food Insecurity - Food Insecurity Present (08/25/2023)   Received from [facility]  Housing: Unknown (09/12/2023)  Transportation Needs: No Transportation Needs (09/12/2023)  Recent Concern: Transportation Needs - Unmet Transportation Needs (08/25/2023)   Received from [facility]  Utilities: Not At Risk (09/12/2023)  Recent Concern: Utilities - At Risk (07/23/2023)   Received from [facility]  Financial Resource Strain: Low Risk  (08/25/2023)   Received from [facility]  Recent Concern: Financial Resource Strain - Medium Risk (07/23/2023)   Received from [facility]  Physical Activity: Insufficiently Active (08/25/2023)   Received from [facility]  Social Connections: Socially Isolated (08/25/2023)   Received from [facility]  Stress: No Stress Concern Present (08/25/2023)   Received from [facility]   Tobacco Use: High Risk (09/25/2023)  Health Literacy: Inadequate Health Literacy (08/25/2023)   Received from [facility]    Readmission Risk Interventions     No data to display

## 2023-10-24 NOTE — Plan of Care (Signed)
  Problem: Education: Goal: Knowledge of General Education information will improve Description: Including pain rating scale, medication(s)/side effects and non-pharmacologic comfort measures Outcome: Progressing   Problem: Clinical Measurements: Goal: Will remain free from infection Outcome: Progressing Goal: Cardiovascular complication will be avoided Outcome: Progressing   Problem: Elimination: Goal: Will not experience complications related to urinary retention Outcome: Progressing   Problem: Clinical Measurements: Goal: Ability to maintain clinical measurements within normal limits will improve Outcome: Not Progressing Goal: Respiratory complications will improve Outcome: Not Progressing   Problem: Activity: Goal: Risk for activity intolerance will decrease Outcome: Not Progressing   Problem: Coping: Goal: Level of anxiety will decrease Outcome: Not Progressing   Problem: Pain Management: Goal: General experience of comfort will improve Outcome: Not Progressing

## 2023-10-24 NOTE — Progress Notes (Signed)
 Progress Note   [name] WUJ:[id number] DOB: [birth year] DOA: 09/10/2023     44 DOS: the patient was seen and examined on 10/24/2023   Brief hospital course: ICU transfer for 10/08/2023.  Taken from prior notes.  Benjamin Bolton is a 63 y.o. male with medical history significant of COPD on 3L O2, HLD, pre-DM, DVT on Eliquis, who presents with SOB.  admitted with Acute on Chronic Hypoxic and Hypercapnic Respiratory Failure in the setting of Acute COPD Exacerbation due to RSV Pneumonia requiring intubation and mechanical ventilation on 09/13/2023.  Was successfully extubated on 1/27, currently on his baseline 3L nasal cannula, not requiring vasopressors.  Hospital course has been complicated questionable HAP and metabolic encephalopathy, which has limited his ability to participate with PT.  While he was Lucid after extubation, did make himself DNR/DNI in presence of family and chaplain.  He will remain full scope of medical care.  . Patient is chronically maintained on 20 mg of prednisone which will continue for now but will require a prolonged taper. He is refusing certain aspects of his care which is challenging given his overall comorbidities.   Initial PT recommendations for CIR which were downgraded to SNF due to his ability to participate with physical therapy.  Might need LTAC placement.  Echocardiogram done on 09/14/2023 with low normal EF and indeterminate diastolic parameters.  Troponin peaked at 115.  Did receive pressors intermittently while in ICU.  Cardiology was consulted which later signed off and no further cardiac evaluation indicated at this time.  There was a questionable superimposed HAP which was treated with antibiotics.  Multiple electrolyte abnormalities which include mild hyperkalemia, hyponatremia and hypophosphatemia which were resolved with repletion.  2/5: Vital stable with borderline soft blood pressure, saturating 100% on 2 L of oxygen, temperature of  100.4 recorded over the past 24-hour.  Labs with slight worsening of leukocytosis at 12.5, rest of the labs stable, CRP improving, CBG elevated. Sputum cultures from 1/22 with normal respiratory flora.  Patient is currently on Zosyn. Very muffled voice and weak cough.  Patient is quite debilitated and is appropriate for LTAC as recommended and PCCM note. Patient is on tube feed through NGT as having postintubation dysphagia.  2/6: Hemodynamically and labs seems stable, mild hypophosphatemia at 2.4.  Anemia panel consistent with anemia of chronic disease with low iron and saturation, ferritin elevated.  Had a bed offer at select LTAC, pending insurance authorization.  Patient is reluctant for PEG tube, stating that improvement in dysphagia and he will like to try more p.o. intake. Small improvement in voice and cough reflex.  Completed the course of Zosyn.  2/7: Hemodynamically stable, slowly improving voice and cough.  Improving CRP.  Pending insurance authorization for LTAC.  Still does not want PEG tube.  2/8: Oxycodone added at his request due to complaint of generalized bodyaches.  Worsening cough and leukocytosis today, repeating chest x-ray.  Ordered a repeat swallow evaluation. Patient just completed course of Zosyn.  2/9: Overnight quite agitated and unable to sleep.  Ativan helped.  Complaining of generalized aches and pain, appears to have very poor insight of his condition.  Labs with mild pseudohyponatremia secondary to hyperglycemia.  Per patient no appetite but p.o. intake slowly improving. Repeat chest x-ray with persistent but improving bilateral pneumonia. CRP again started trending up.  Continue to have some diarrhea, ordered CBC as add-on  2/10: LTAC placement was declined by insurance even with peer to peer review.  They want a definitive  feeding plan in place.  Patient with poor appetite but able to swallow now.  We will try giving him a break from NG tube, he was on a continuous  field.  Might pull the NG tube out and see if he can take adequate p.o. intake.  If he continued to need tube feeding then PEG will be the best option but patient does not want it.  2/11: Patient became febrile again overnight, maximum temperature recorded of 102.  CRP again started trending up, slight worsening of leukocytosis, repeat chest x-ray with increasing infiltrate.  Patient recently received multiple courses of antibiotic.  UA does not look infected.  Infectious disease was also consulted.  Blood, sputum and wound cultures ordered.  Starting on Zosyn Calorie count with p.o. intake started, tube feeding switched to bolus if he is unable to eat p.o. Appeal was made for [facility] decision.  2/12: Maximum temperature recorded over the past 24 hours was 100.2, remained tachycardic, worsening leukocytosis at 14.4, and CRP.  CT chest abdomen and pelvis with continued consolidation in lower lobes bilaterally concerning for pneumonia.  Mild gaseous distention of colon, may reflect mild ileus, repeat respiratory viral panel negative, cultures are pending. Patient agrees to PEG placement, IR was consulted and they are would like to wait until current fever and concern of infection is sorted out.  Patient also need 48 hours of Eliquis break before placement. LTAC appeal still pending  Patient is high risk for worsening comorbidities and mortality, palliative care was consulted.  2/13: Patient remained febrile, NG tube was removed today so he can try improving his p.o. intake, patient will not decide about PEG tube after p.o. challenge.  ID to decide about whether to add further antibiotics, worsening inflammatory markers.  Patient still not ready for hospice.  2/21 s/p PEG tube placement done by IR on 2/18, resume Eliquis on 2/19 p.m. dose after 24 hours as per pharmacy. Resumed PEG tube feeding after 4 hours of insertion.   P2P done, LTAC declined and recommended SNF placement when patient is able to  participate with PT. Informed to TOC    Assessment and Plan:  # Acute on chronic respiratory failure with hypoxia (HCC) # COPD exacerbation-treated and resolved # RSV pneumonia-treated # Concern of superimposed HAP. S/p prolonged intubation with advanced underlying COPD. Currently saturating well on 2 L of oxygen which is his baseline. Postintubation muffled voice and weak cough. S/p Zosyn till 2/11 for concern of HAP although tracheal cultures with normal respiratory flora.  Inflammatory markers seems improving. -Continue supplemental oxygen -Incentive spirometry and flutter valve -Chest PT -Continue with supportive care -Repeat swallow evaluation due to worsening cough -Repeat chest x-ray-with persistent bilateral pneumonia 2/20 started Mucinex extremity gram p.o. twice daily  Fever Tam 101 on 2/12 Patient became febrile again with worsening leukocytosis and CRP.  Repeat chest x-ray with worsening infiltrate, ordered repeat blood, sputum and wound cultures.  Diarrhea has been resolved.  UA does not look infected.  Preliminary blood cultures negative, sputum cultures pending.  Legionella and TB QuantiFERON was also sent by ID CT chest, abdomen and pelvis with concern of bilateral basal opacities/pneumonia. ID consulted, s/p Zosyn, rec to follow off antibiotics. 2/11 blood culture NGTD, 2/11 RVP negative, 2/12 COVID-negative 2/12 RSV positive  # Suspected candidal esophagitis as per ID Started Diflucan 200 mg p.o. daily, continue for 2 weeks (2/12--2/26)   # COPD exacerbation, resolved Patient is on prednisone 20 mg chronically which has been continued  # DVT (  deep venous thrombosis) (HCC) Patient has history of DVT. 2/14 held Eliquis, started Lovenox for now due to PEG tube placement on Tuesday 2/18 2/19 resume Eliquis today p.m. dose as per pharmacy   # Protein-calorie malnutrition, severe (HCC)  Swallowing much improved. Started calorie count as p.o. intake slowly  improving, appetite remained poor and unable to meet his caloric requirement, losing weight.   NGT was removed today, IR was consulted yesterday for PEG tube placement when he agreed, today he again saying that does not want PEG tube and wants to give himself a p.o. challenge. -Continue to monitor 2/14 agreed for PEG tube placement 2/14 held Eliquis, started Lovenox for now due to PEG tube placement on Monday 2/18 s/p PEG tube insertion, RD consulted for PEG tube feeding.   Hypotension, started midodrine 5 mg p.o. 3 times daily with holding parameters on 2/15   Electrolyte abnormality Patient had multiple electrolyte abnormalities during this complicated course of hospitalization which involved mild hyperkalemia, hyponatremia and hypophosphatemia which has been improved. Hypokalemia, potassium repleted. -Continue to monitor-replete as needed  Normochromic anemia Hemoglobin currently stable at 8.7.  Likely due to chronic disease. Anemia panel consistent with anemia of chronic disease -Monitor hemoglobin -Transfuse if below 7  Pre-diabetes A1c of 6.4, CBG currently elevated. Patient is on tube feed and steroid. -Continue with SSI  Elevated troponin Echocardiogram done on 09/14/2023 with 50 to 55% EF, indeterminate diastolic parameter.  Likely secondary to demand ischemia.  Troponin peaked at 115 Initially cardiology was consulted and later the signed off, no intervention required.  Diarrhea Improved.  No abdominal pain but to have mild leukocytosis. High risk for C. Difficile. -Continue with supportive care  # Iron deficiency, transferrin saturation 13%, started oral iron supplement with vitamin C.    Nutrition Documentation    Flowsheet Row ED to Hosp-Admission (Current) from 09/10/2023 in [facility]  Nutrition Problem Severe Malnutrition  Etiology acute illness  Nutrition Goal Patient will meet greater than or equal to 90% of their needs  Interventions  Refer to RD note for recommendations     ,  Active Pressure Injury/Wound(s)     Pressure Ulcer  Duration          Pressure Injury 10/08/23 Sacrum Mid Unstageable - Full thickness tissue loss in which the base of the injury is covered by slough (yellow, tan, gray, green or brown) and/or eschar (tan, brown or black) in the wound bed. 10 days             Subjective:  No significant overnight events, complaining of abdominal pain 10/10, it does improve with pain medication but not much, patient is requesting to increase the dose of pain medications.  So increased oxycodone 5 to 10 mg p.o. every 4 hourly as needed. Mucinex is helping, cough is less as per patient.  Patient denied any other complaints.   Physical Exam: Vitals:   10/24/23 0500 10/24/23 0604 10/24/23 0747 10/24/23 1220  BP:   107/75 99/69  Pulse:   85 86  Resp: 16 18 19 18   Temp:   97.8 F (36.6 C) 98.6 F (37 C)  TempSrc:      SpO2:   99% 98%  Weight:      Height:       General.  Frail and severely malnourished gentleman, in no acute distress. Pulmonary.  Equal air entry bilaterally, bilateral crackles, no wheezes CV.  Regular rate and rhythm, no JVD, rub or murmur. Abdomen.  Soft, nontender, nondistended, BS positive.  PEG tube intact CNS.  Alert and oriented .  No focal neurologic deficit. Extremities.  No edema, no cyanosis, pulses intact and symmetrical.  Data Reviewed: Prior data reviewed  Family Communication: None available at bedside  Disposition: Status is: Inpatient Remains inpatient appropriate because: Severity of illness  Planned Discharge Destination: SNF when bed will be available.  DVT prophylaxis.  Eliquis Time spent: 40 minutes  This record has been created using Conservation officer, historic buildings. Errors have been sought and corrected,but may not always be located. Such creation errors do not reflect on the standard of care.   Author: Gillis Santa, MD 10/24/2023 4:10 PM  For on  call review [email or web address].

## 2023-10-24 NOTE — Plan of Care (Signed)
   Problem: Education: Goal: Knowledge of General Education information will improve Description: Including pain rating scale, medication(s)/side effects and non-pharmacologic comfort measures Outcome: Progressing   Problem: Health Behavior/Discharge Planning: Goal: Ability to manage health-related needs will improve Outcome: Progressing   Problem: Clinical Measurements: Goal: Ability to maintain clinical measurements within normal limits will improve Outcome: Progressing Goal: Will remain free from infection Outcome: Progressing Goal: Diagnostic test results will improve Outcome: Progressing Goal: Respiratory complications will improve Outcome: Progressing Goal: Cardiovascular complication will be avoided Outcome: Progressing   Problem: Activity: Goal: Risk for activity intolerance will decrease Outcome: Progressing   Problem: Nutrition: Goal: Adequate nutrition will be maintained Outcome: Progressing   Problem: Coping: Goal: Level of anxiety will decrease Outcome: Progressing   Problem: Elimination: Goal: Will not experience complications related to bowel motility Outcome: Progressing Goal: Will not experience complications related to urinary retention Outcome: Progressing   Problem: Pain Management: Goal: General experience of comfort will improve Outcome: Progressing   Problem: Safety: Goal: Ability to remain free from injury will improve Outcome: Progressing   Problem: Skin Integrity: Goal: Risk for impaired skin integrity will decrease Outcome: Progressing   Problem: Education: Goal: Knowledge of disease or condition will improve Outcome: Progressing Goal: Knowledge of the prescribed therapeutic regimen will improve Outcome: Progressing   Problem: Activity: Goal: Ability to tolerate increased activity will improve Outcome: Progressing Goal: Will verbalize the importance of balancing activity with adequate rest periods Outcome: Progressing   Problem:  Respiratory: Goal: Ability to maintain a clear airway will improve Outcome: Progressing Goal: Levels of oxygenation will improve Outcome: Progressing Goal: Ability to maintain adequate ventilation will improve Outcome: Progressing   Problem: Education: Goal: Ability to describe self-care measures that may prevent or decrease complications (Diabetes Survival Skills Education) will improve Outcome: Progressing   Problem: Coping: Goal: Ability to adjust to condition or change in health will improve Outcome: Progressing   Problem: Fluid Volume: Goal: Ability to maintain a balanced intake and output will improve Outcome: Progressing   Problem: Health Behavior/Discharge Planning: Goal: Ability to identify and utilize available resources and services will improve Outcome: Progressing Goal: Ability to manage health-related needs will improve Outcome: Progressing   Problem: Metabolic: Goal: Ability to maintain appropriate glucose levels will improve Outcome: Progressing   Problem: Nutritional: Goal: Maintenance of adequate nutrition will improve Outcome: Progressing Goal: Progress toward achieving an optimal weight will improve Outcome: Progressing   Problem: Skin Integrity: Goal: Risk for impaired skin integrity will decrease Outcome: Progressing   Problem: Tissue Perfusion: Goal: Adequacy of tissue perfusion will improve Outcome: Progressing   Problem: Activity: Goal: Ability to tolerate increased activity will improve Outcome: Progressing   Problem: Respiratory: Goal: Ability to maintain a clear airway and adequate ventilation will improve Outcome: Progressing   Problem: Role Relationship: Goal: Method of communication will improve Outcome: Progressing

## 2023-10-24 NOTE — Plan of Care (Signed)
 Problem: Education: Goal: Knowledge of General Education information will improve Description: Including pain rating scale, medication(s)/side effects and non-pharmacologic comfort measures 10/24/2023 1552 by Lowella Petties, RN Outcome: Progressing 10/24/2023 1551 by Lowella Petties, RN Outcome: Progressing   Problem: Health Behavior/Discharge Planning: Goal: Ability to manage health-related needs will improve 10/24/2023 1552 by Lowella Petties, RN Outcome: Progressing 10/24/2023 1551 by Lowella Petties, RN Outcome: Progressing   Problem: Clinical Measurements: Goal: Ability to maintain clinical measurements within normal limits will improve 10/24/2023 1552 by Lowella Petties, RN Outcome: Progressing 10/24/2023 1551 by Lowella Petties, RN Outcome: Progressing Goal: Will remain free from infection 10/24/2023 1552 by Lowella Petties, RN Outcome: Progressing 10/24/2023 1551 by Lowella Petties, RN Outcome: Progressing Goal: Diagnostic test results will improve 10/24/2023 1552 by Lowella Petties, RN Outcome: Progressing 10/24/2023 1551 by Lowella Petties, RN Outcome: Progressing Goal: Respiratory complications will improve 10/24/2023 1552 by Lowella Petties, RN Outcome: Progressing 10/24/2023 1551 by Lowella Petties, RN Outcome: Progressing Goal: Cardiovascular complication will be avoided 10/24/2023 1552 by Lowella Petties, RN Outcome: Progressing 10/24/2023 1551 by Lowella Petties, RN Outcome: Progressing   Problem: Activity: Goal: Risk for activity intolerance will decrease 10/24/2023 1552 by Lowella Petties, RN Outcome: Progressing 10/24/2023 1551 by Lowella Petties, RN Outcome: Progressing   Problem: Nutrition: Goal: Adequate nutrition will be maintained 10/24/2023 1552 by Lowella Petties, RN Outcome: Progressing 10/24/2023 1551 by Lowella Petties, RN Outcome: Progressing   Problem:  Coping: Goal: Level of anxiety will decrease 10/24/2023 1552 by Lowella Petties, RN Outcome: Progressing 10/24/2023 1551 by Lowella Petties, RN Outcome: Progressing   Problem: Elimination: Goal: Will not experience complications related to bowel motility 10/24/2023 1552 by Lowella Petties, RN Outcome: Progressing 10/24/2023 1551 by Lowella Petties, RN Outcome: Progressing Goal: Will not experience complications related to urinary retention 10/24/2023 1552 by Lowella Petties, RN Outcome: Progressing 10/24/2023 1551 by Lowella Petties, RN Outcome: Progressing   Problem: Pain Management: Goal: General experience of comfort will improve 10/24/2023 1552 by Lowella Petties, RN Outcome: Progressing 10/24/2023 1551 by Lowella Petties, RN Outcome: Progressing   Problem: Safety: Goal: Ability to remain free from injury will improve 10/24/2023 1552 by Lowella Petties, RN Outcome: Progressing 10/24/2023 1551 by Lowella Petties, RN Outcome: Progressing   Problem: Skin Integrity: Goal: Risk for impaired skin integrity will decrease 10/24/2023 1552 by Lowella Petties, RN Outcome: Progressing 10/24/2023 1551 by Lowella Petties, RN Outcome: Progressing   Problem: Education: Goal: Knowledge of disease or condition will improve 10/24/2023 1552 by Lowella Petties, RN Outcome: Progressing 10/24/2023 1551 by Lowella Petties, RN Outcome: Progressing Goal: Knowledge of the prescribed therapeutic regimen will improve 10/24/2023 1552 by Lowella Petties, RN Outcome: Progressing 10/24/2023 1551 by Lowella Petties, RN Outcome: Progressing   Problem: Activity: Goal: Ability to tolerate increased activity will improve 10/24/2023 1552 by Lowella Petties, RN Outcome: Progressing 10/24/2023 1551 by Lowella Petties, RN Outcome: Progressing Goal: Will verbalize the importance of balancing activity with adequate rest  periods 10/24/2023 1552 by Lowella Petties, RN Outcome: Progressing 10/24/2023 1551 by Lowella Petties, RN Outcome: Progressing   Problem: Respiratory: Goal: Ability to maintain a clear airway will improve 10/24/2023 1552 by Lowella Petties, RN Outcome: Progressing 10/24/2023 1551 by Lowella Petties, RN Outcome: Progressing Goal: Levels of oxygenation will improve 10/24/2023 1552 by Lowella Petties, RN Outcome: Progressing  10/24/2023 1551 by Lowella Petties, RN Outcome: Progressing Goal: Ability to maintain adequate ventilation will improve 10/24/2023 1552 by Lowella Petties, RN Outcome: Progressing 10/24/2023 1551 by Lowella Petties, RN Outcome: Progressing   Problem: Education: Goal: Ability to describe self-care measures that may prevent or decrease complications (Diabetes Survival Skills Education) will improve 10/24/2023 1552 by Lowella Petties, RN Outcome: Progressing 10/24/2023 1551 by Lowella Petties, RN Outcome: Progressing   Problem: Coping: Goal: Ability to adjust to condition or change in health will improve 10/24/2023 1552 by Lowella Petties, RN Outcome: Progressing 10/24/2023 1551 by Lowella Petties, RN Outcome: Progressing   Problem: Fluid Volume: Goal: Ability to maintain a balanced intake and output will improve 10/24/2023 1552 by Lowella Petties, RN Outcome: Progressing 10/24/2023 1551 by Lowella Petties, RN Outcome: Progressing   Problem: Health Behavior/Discharge Planning: Goal: Ability to identify and utilize available resources and services will improve 10/24/2023 1552 by Lowella Petties, RN Outcome: Progressing 10/24/2023 1551 by Lowella Petties, RN Outcome: Progressing Goal: Ability to manage health-related needs will improve 10/24/2023 1552 by Lowella Petties, RN Outcome: Progressing 10/24/2023 1551 by Lowella Petties, RN Outcome: Progressing   Problem:  Metabolic: Goal: Ability to maintain appropriate glucose levels will improve 10/24/2023 1552 by Lowella Petties, RN Outcome: Progressing 10/24/2023 1551 by Lowella Petties, RN Outcome: Progressing   Problem: Nutritional: Goal: Maintenance of adequate nutrition will improve 10/24/2023 1552 by Lowella Petties, RN Outcome: Progressing 10/24/2023 1551 by Lowella Petties, RN Outcome: Progressing Goal: Progress toward achieving an optimal weight will improve 10/24/2023 1552 by Lowella Petties, RN Outcome: Progressing 10/24/2023 1551 by Lowella Petties, RN Outcome: Progressing   Problem: Skin Integrity: Goal: Risk for impaired skin integrity will decrease 10/24/2023 1552 by Lowella Petties, RN Outcome: Progressing 10/24/2023 1551 by Lowella Petties, RN Outcome: Progressing   Problem: Tissue Perfusion: Goal: Adequacy of tissue perfusion will improve 10/24/2023 1552 by Lowella Petties, RN Outcome: Progressing 10/24/2023 1551 by Lowella Petties, RN Outcome: Progressing   Problem: Activity: Goal: Ability to tolerate increased activity will improve 10/24/2023 1552 by Lowella Petties, RN Outcome: Progressing 10/24/2023 1551 by Lowella Petties, RN Outcome: Progressing   Problem: Respiratory: Goal: Ability to maintain a clear airway and adequate ventilation will improve 10/24/2023 1552 by Lowella Petties, RN Outcome: Progressing 10/24/2023 1551 by Lowella Petties, RN Outcome: Progressing   Problem: Role Relationship: Goal: Method of communication will improve 10/24/2023 1552 by Lowella Petties, RN Outcome: Progressing 10/24/2023 1551 by Lowella Petties, RN Outcome: Progressing

## 2023-10-25 DIAGNOSIS — E43 Unspecified severe protein-calorie malnutrition: Secondary | ICD-10-CM | POA: Diagnosis not present

## 2023-10-25 DIAGNOSIS — J441 Chronic obstructive pulmonary disease with (acute) exacerbation: Secondary | ICD-10-CM | POA: Diagnosis not present

## 2023-10-25 DIAGNOSIS — J9621 Acute and chronic respiratory failure with hypoxia: Secondary | ICD-10-CM | POA: Diagnosis not present

## 2023-10-25 DIAGNOSIS — Z515 Encounter for palliative care: Secondary | ICD-10-CM | POA: Diagnosis not present

## 2023-10-25 LAB — BASIC METABOLIC PANEL
Anion gap: 11 (ref 5–15)
BUN: 16 mg/dL (ref 8–23)
CO2: 31 mmol/L (ref 22–32)
Calcium: 9.7 mg/dL (ref 8.9–10.3)
Chloride: 93 mmol/L — ABNORMAL LOW (ref 98–111)
Creatinine, Ser: 0.49 mg/dL — ABNORMAL LOW (ref 0.61–1.24)
GFR, Estimated: >60 mL/min (ref >60)
Glucose, Bld: 95 mg/dL (ref 70–99)
Potassium: 4.4 mmol/L (ref 3.5–5.1)
Sodium: 135 mmol/L (ref 135–145)

## 2023-10-25 LAB — GLUCOSE, CAPILLARY
Glucose-Capillary: 115 mg/dL — ABNORMAL HIGH (ref 70–99)
Glucose-Capillary: 129 mg/dL — ABNORMAL HIGH (ref 70–99)
Glucose-Capillary: 137 mg/dL — ABNORMAL HIGH (ref 70–99)
Glucose-Capillary: 151 mg/dL — ABNORMAL HIGH (ref 70–99)
Glucose-Capillary: 167 mg/dL — ABNORMAL HIGH (ref 70–99)
Glucose-Capillary: 180 mg/dL — ABNORMAL HIGH (ref 70–99)
Glucose-Capillary: 202 mg/dL — ABNORMAL HIGH (ref 70–99)

## 2023-10-25 LAB — CBC
HCT: 28.7 % — ABNORMAL LOW (ref 39.0–52.0)
Hemoglobin: 9.1 g/dL — ABNORMAL LOW (ref 13.0–17.0)
MCH: 29.4 pg (ref 26.0–34.0)
MCHC: 31.7 g/dL (ref 30.0–36.0)
MCV: 92.6 fL (ref 80.0–100.0)
Platelets: 426 10*3/uL — ABNORMAL HIGH (ref 150–400)
RBC: 3.1 MIL/uL — ABNORMAL LOW (ref 4.22–5.81)
RDW: 15.7 % — ABNORMAL HIGH (ref 11.5–15.5)
WBC: 13.9 10*3/uL — ABNORMAL HIGH (ref 4.0–10.5)
nRBC: 0.3 % — ABNORMAL HIGH (ref 0.0–0.2)

## 2023-10-25 MED ORDER — PANTOPRAZOLE SODIUM 40 MG PO TBEC
40.0000 mg | DELAYED_RELEASE_TABLET | Freq: Every day | ORAL | Status: DC
  Administered 2023-10-25 – 2023-10-29 (×5): 40 mg via ORAL
  Filled 2023-10-25 (×5): qty 1

## 2023-10-25 MED ORDER — SODIUM CHLORIDE 0.9 % IV SOLN: 12.5000 mg | Freq: Four times a day (QID) | INTRAVENOUS | Status: DC | PRN

## 2023-10-25 MED ORDER — GABAPENTIN 300 MG PO CAPS
300.0000 mg | ORAL_CAPSULE | Freq: Three times a day (TID) | ORAL | Status: DC
  Administered 2023-10-25 – 2023-10-29 (×13): 300 mg via ORAL
  Filled 2023-10-25 (×13): qty 1

## 2023-10-25 MED ORDER — KETOROLAC TROMETHAMINE 15 MG/ML IJ SOLN
15.0000 mg | Freq: Four times a day (QID) | INTRAMUSCULAR | Status: DC
  Administered 2023-10-25 – 2023-10-29 (×17): 15 mg via INTRAVENOUS
  Filled 2023-10-25 (×17): qty 1

## 2023-10-25 NOTE — Progress Notes (Addendum)
 Palliative Care Progress Note, Assessment & Plan   Patient Name: Benjamin Bolton       Date: 10/25/2023 DOB: 09-03-1960  Age: 63 y.o. MRN#: [id number] Attending Physician: Tresa Moore, MD Primary Care Physician: [facility] Admit Date: 09/10/2023  Subjective: Patient is lying in bed in no apparent distress.  He is awake, alert, and oriented x 4.  His voice quality remains weak but he is able to speak clearly.  No family or friends present during my visit.  HPI: 63 y.o. 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  Patient has been successfully extubated, however has required NG tube for nutritional support.  He remains weak and intermittently compliant with PT/OT/medication regimen compliance.  Patient has been declined potential for admission to [facility] inpatient rehab. Plan now for SNF for STR.  Summary of counseling/coordination of care: Extensive chart review completed prior to meeting patient including labs, vital signs, imaging, progress notes, orders, and available advanced directive documents from current and previous encounters.   After reviewing the patient's chart and assessing the patient at bedside, I spoke with patient in regards to symptom management and goals of care.   Symptoms assessed.  Patient shares that he feels like the devil has tried to come and get him today.  He has been transferred from 1 room to another.  He feels very unsettled.  Space and opportunity provided for patient to share his thoughts and emotions.  Therapeutic thoughts, active listening, and  emotional support provided.  Patient shares unhappiness that someone erased his sisters phone number from his whiteboard.  I placed patient's sister's phone number 1 piece of paper within patient's reach.  Patient also complained that he has not been able to be set up for his lunch.  Lunch tray at bedside was situated for patient to be able to have it ready to eat.  Discussed importance of nutrition as a contributing factor to patient's overall prognosis.  He shares that he is not hungry for food but understands the importance of taking in calories.  Patient shares that he has not given up and wants to continue with trying to get better.  He shares that his legs are weak, he felt this when going to the bathroom earlier, and is looking forward to them getting stronger.  Discussed that plan of care remains for patient to discharge to SNF for short-term rehab.  He remains in agreement with this plan.  In light of patient transferring to another medical facility, introduced the concept of a MOST form.  Details of MOST form discussed.  Space and opportunity provided for patient to ask questions in regards to MOST form.  He shares he believes it is important to complete it and will read over it.  Copy of MOST form left at bedside.  PMT will continue to follow and support patient and family throughout his hospitalization.  Physical Exam Vitals reviewed.  Constitutional:      Comments: thin  Eyes:     Pupils: Pupils are  equal, round, and reactive to light.  Cardiovascular:     Rate and Rhythm: Normal rate.  Pulmonary:     Effort: Pulmonary effort is normal.  Musculoskeletal:     Cervical back: Normal range of motion.     Comments: Generalized weakness, MAETC  Skin:    General: Skin is warm and dry.  Neurological:     Mental Status: He is alert and oriented to person, place, and time.  Psychiatric:        Mood and Affect: Mood normal. Mood is not anxious.        Behavior: Behavior normal.  Behavior is not agitated.             Total Time 25 minutes   Time spent includes: Detailed review of medical records (labs, imaging, vital signs), medically appropriate exam (mental status, respiratory, cardiac, skin), discussed with treatment team, counseling and educating patient, family and staff, documenting clinical information, medication management and coordination of care.  Samara Deist L. Bonita Quin, DNP, FNP-BC Palliative Medicine Team

## 2023-10-25 NOTE — Progress Notes (Signed)
 PROGRESS NOTE    Benjamin Bolton  MWN:[id number] DOB: 12-Jan-1961 DOA: 09/10/2023 PCP: [facility]    Brief Narrative:  Benjamin Bolton is a 63 y.o. male with medical history significant of COPD on 3L O2, HLD, pre-DM, DVT on Eliquis, who presents with SOB.  admitted with Acute on Chronic Hypoxic and Hypercapnic Respiratory Failure in the setting of Acute COPD Exacerbation due to RSV Pneumonia requiring intubation and mechanical ventilation on 09/13/2023.  Was successfully extubated on 1/27, currently on his baseline 3L nasal cannula, not requiring vasopressors.  Hospital course has been complicated questionable HAP and metabolic encephalopathy, which has limited his ability to participate with PT.  While he was Lucid after extubation, did make himself DNR/DNI in presence of family and chaplain.  He will remain full scope of medical care.   . Patient is chronically maintained on 20 mg of prednisone which will continue for now but will require a prolonged taper. He is refusing certain aspects of his care which is challenging given his overall comorbidities.    Initial PT recommendations for CIR which were downgraded to SNF due to his ability to participate with physical therapy.  Might need LTAC placement.   Echocardiogram done on 09/14/2023 with low normal EF and indeterminate diastolic parameters.  Troponin peaked at 115.  Did receive pressors intermittently while in ICU.  Cardiology was consulted which later signed off and no further cardiac evaluation indicated at this time.   There was a questionable superimposed HAP which was treated with antibiotics.   Multiple electrolyte abnormalities which include mild hyperkalemia, hyponatremia and hypophosphatemia which were resolved with repletion.   2/5: Vital stable with borderline soft blood pressure, saturating 100% on 2 L of oxygen, temperature of 100.4 recorded over the past 24-hour.  Labs with slight worsening of leukocytosis at 12.5, rest  of the labs stable, CRP improving, CBG elevated. Sputum cultures from 1/22 with normal respiratory flora.  Patient is currently on Zosyn. Very muffled voice and weak cough.  Patient is quite debilitated and is appropriate for LTAC as recommended and PCCM note. Patient is on tube feed through NGT as having postintubation dysphagia.   2/6: Hemodynamically and labs seems stable, mild hypophosphatemia at 2.4.  Anemia panel consistent with anemia of chronic disease with low iron and saturation, ferritin elevated.  Had a bed offer at select LTAC, pending insurance authorization.  Patient is reluctant for PEG tube, stating that improvement in dysphagia and he will like to try more p.o. intake. Small improvement in voice and cough reflex.  Completed the course of Zosyn.   2/7: Hemodynamically stable, slowly improving voice and cough.  Improving CRP.  Pending insurance authorization for LTAC.  Still does not want PEG tube.   2/8: Oxycodone added at his request due to complaint of generalized bodyaches.  Worsening cough and leukocytosis today, repeating chest x-ray.  Ordered a repeat swallow evaluation. Patient just completed course of Zosyn.   2/9: Overnight quite agitated and unable to sleep.  Ativan helped.  Complaining of generalized aches and pain, appears to have very poor insight of his condition.  Labs with mild pseudohyponatremia secondary to hyperglycemia.  Per patient no appetite but p.o. intake slowly improving. Repeat chest x-ray with persistent but improving bilateral pneumonia. CRP again started trending up.  Continue to have some diarrhea, ordered CBC as add-on   2/10: LTAC placement was declined by insurance even with peer to peer review.  They want a definitive feeding plan in place.  Patient with  poor appetite but able to swallow now.  We will try giving him a break from NG tube, he was on a continuous field.  Might pull the NG tube out and see if he can take adequate p.o. intake.  If he  continued to need tube feeding then PEG will be the best option but patient does not want it.   2/11: Patient became febrile again overnight, maximum temperature recorded of 102.  CRP again started trending up, slight worsening of leukocytosis, repeat chest x-ray with increasing infiltrate.  Patient recently received multiple courses of antibiotic.  UA does not look infected.  Infectious disease was also consulted.  Blood, sputum and wound cultures ordered.  Starting on Zosyn Calorie count with p.o. intake started, tube feeding switched to bolus if he is unable to eat p.o. Appeal was made for [facility] decision.   2/12: Maximum temperature recorded over the past 24 hours was 100.2, remained tachycardic, worsening leukocytosis at 14.4, and CRP.  CT chest abdomen and pelvis with continued consolidation in lower lobes bilaterally concerning for pneumonia.  Mild gaseous distention of colon, may reflect mild ileus, repeat respiratory viral panel negative, cultures are pending. Patient agrees to PEG placement, IR was consulted and they are would like to wait until current fever and concern of infection is sorted out.  Patient also need 48 hours of Eliquis break before placement. LTAC appeal still pending   Patient is high risk for worsening comorbidities and mortality, palliative care was consulted.   2/13: Patient remained febrile, NG tube was removed today so he can try improving his p.o. intake, patient will not decide about PEG tube after p.o. challenge.  ID to decide about whether to add further antibiotics, worsening inflammatory markers.  Patient still not ready for hospice.   2/21 s/p PEG tube placement done by IR on 2/18, resume Eliquis on 2/19 p.m. dose after 24 hours as per pharmacy. Resumed PEG tube feeding after 4 hours of insertion.   P2P done, LTAC declined and recommended SNF placement when patient is able to participate with PT. Informed to [facility]   Assessment & Plan:   Principal Problem:    Acute on chronic respiratory failure with hypoxia (HCC) Active Problems:   COPD exacerbation (HCC)   Fever   DVT (deep venous thrombosis) (HCC)   Protein-calorie malnutrition, severe (HCC)   Electrolyte abnormality   Normochromic anemia   Pre-diabetes   Elevated troponin   Tobacco abuse   Diarrhea  # Acute on chronic respiratory failure with hypoxia (HCC) # COPD exacerbation-treated and resolved # RSV pneumonia-treated # Concern of superimposed HAP. S/p prolonged intubation with advanced underlying COPD. Currently saturating well on 2 L of oxygen which is his baseline. Postintubation muffled voice and weak cough. S/p Zosyn till 2/11 for concern of HAP although tracheal cultures with normal respiratory flora.  Inflammatory markers seems improving. Plan: -Continue supplemental oxygen -Incentive spirometry and flutter valve -Chest PT   Fever Tam 101 on 2/12 Patient became febrile again with worsening leukocytosis and CRP.  Repeat chest x-ray with worsening infiltrate, ordered repeat blood, sputum and wound cultures.  Diarrhea has been resolved.  UA does not look infected.  Preliminary blood cultures negative, sputum cultures pending.  Legionella and TB QuantiFERON was also sent by ID CT chest, abdomen and pelvis with concern of bilateral basal opacities/pneumonia. ID consulted, s/p Zosyn, rec to follow off antibiotics. 2/11 blood culture NGTD, 2/11 RVP negative, 2/12 COVID-negative 2/12 RSV positive   # Suspected candidal esophagitis  as per ID Started Diflucan 200 mg p.o. daily, continue for 2 weeks (2/12--2/26)     # COPD exacerbation, resolved Patient is on prednisone 20 mg chronically which has been continued   # DVT (deep venous thrombosis) (HCC) Patient has history of DVT. 2/14 held Eliquis, started Lovenox for now due to PEG tube placement on Tuesday 2/18 2/19 resume Eliquis today p.m. dose as per pharmacy     # Protein-calorie malnutrition, severe (HCC)  Swallowing  much improved. Started calorie count as p.o. intake slowly improving, appetite remained poor and unable to meet his caloric requirement, losing weight.   NGT was removed today, IR was consulted yesterday for PEG tube placement when he agreed, today he again saying that does not want PEG tube and wants to give himself a p.o. challenge. -Continue to monitor 2/14 agreed for PEG tube placement 2/14 held Eliquis, started Lovenox for now due to PEG tube placement on Monday 2/18 s/p PEG tube insertion, RD consulted for PEG tube feeding.  Intractable pain Patient endorses severe pain that is preventing him from working with therapy.  He has unfortunately been denied from long-term acute care hospital.  Will need to take efforts to optimize him for skilled nursing facility.  Escalate pain regimen.  Avoid IV narcotic use.     Hypotension, started midodrine 5 mg p.o. 3 times daily with holding parameters on 2/15     Electrolyte abnormality Patient had multiple electrolyte abnormalities during this complicated course of hospitalization which involved mild hyperkalemia, hyponatremia and hypophosphatemia which has been improved. Hypokalemia, potassium repleted. -Continue to monitor-replete as needed   Normochromic anemia Hemoglobin currently stable at 8.7.  Likely due to chronic disease. Anemia panel consistent with anemia of chronic disease -Monitor hemoglobin -Transfuse if below 7   Pre-diabetes A1c of 6.4, CBG currently elevated. Patient is on tube feed and steroid. -Continue with SSI   Elevated troponin Echocardiogram done on 09/14/2023 with 50 to 55% EF, indeterminate diastolic parameter.  Likely secondary to demand ischemia.  Troponin peaked at 115 Initially cardiology was consulted and later the signed off, no intervention required.   Diarrhea Improved.  No abdominal pain but to have mild leukocytosis. High risk for C. Difficile. -Continue with supportive care   # Iron deficiency,  transferrin saturation 13%, started oral iron supplement with vitamin C.   DVT prophylaxis: Apixaban Code Status: DNR Family Communication:None today Disposition Plan: Status is: Inpatient Remains inpatient appropriate because: Multiple acute issues as above.  Will need care facility postdischarge.   Level of care: Telemetry Medical  Consultants:  None  Procedures:  PEG  Antimicrobials: None    Subjective: Seen and examined.  Resting in bed.  Endorses severe pain in the back daily.  Objective: Vitals:   10/24/23 1617 10/24/23 2111 10/25/23 0500 10/25/23 0729  BP: 104/77 115/81  119/78  Pulse: 88 84  76  Resp: 19 18  16   Temp: 98.2 F (36.8 C) 97.9 F (36.6 C)  97.8 F (36.6 C)  TempSrc: Oral   Oral  SpO2: 100% 100%  100%  Weight:   52.2 kg   Height:        Intake/Output Summary (Last 24 hours) at 10/25/2023 1042 Last data filed at 10/24/2023 1715 Gross per 24 hour  Intake --  Output 200 ml  Net -200 ml   Filed Weights   10/23/23 0928 10/24/23 0451 10/25/23 0500  Weight: 51 kg 51 kg 52.2 kg    Examination:  General exam: NAD.  Appears chronically ill Respiratory system: Scattered crackles bilaterally.  Normal work of breathing.  Room air Cardiovascular system: S1-S2, RRR, no murmurs, no pedal edema Gastrointestinal system: Soft, NT/ND, normal bowel sounds, + PEG Central nervous system: Alert and oriented. No focal neurological deficits. Extremities: Symmetrically decreased power bilateral Skin: No rashes, lesions or ulcers Psychiatry: Judgement and insight appear normal. Mood & affect appropriate.     Data Reviewed: I have personally reviewed following labs and imaging studies  CBC: Recent Labs  Lab 10/21/23 0450 10/22/23 0446 10/23/23 0700 10/24/23 0459 10/25/23 0419  WBC 10.2 11.3* 11.0* 11.9* 13.9*  HGB 8.1* 8.7* 8.7* 8.7* 9.1*  HCT 25.0* 27.3* 27.7* 27.6* 28.7*  MCV 91.9 92.9 93.6 93.2 92.6  PLT 393 405* 399 411* 426*   Basic  Metabolic Panel: Recent Labs  Lab 10/21/23 0450 10/21/23 0943 10/21/23 1802 10/22/23 0446 10/22/23 1701 10/23/23 0700 10/24/23 0459 10/25/23 0419  NA 136  --   --  135  --  137 136 135  K 4.1  --   --  3.9  --  4.2 4.5 4.4  CL 96*  --   --  98  --  98 93* 93*  CO2 29  --   --  27  --  30 32 31  GLUCOSE 116*  --   --  121*  --  194* 121* 95  BUN 14  --   --  13  --  17 19 16   CREATININE 0.54*  --   --  0.56*  --  0.58* 0.62 0.49*  CALCIUM 9.2  --   --  9.0  --  9.3 9.4 9.7  MG 2.0 2.0 2.1 2.0 2.3 2.0  --   --   PHOS 3.2 3.2 3.3 4.0 3.6 3.2  --   --    GFR: Estimated Creatinine Clearance: 70.7 mL/min (A) (by C-G formula based on SCr of 0.49 mg/dL (L)). Liver Function Tests: No results for input(s): "AST", "ALT", "ALKPHOS", "BILITOT", "PROT", "ALBUMIN" in the last 168 hours. No results for input(s): "LIPASE", "AMYLASE" in the last 168 hours. No results for input(s): "AMMONIA" in the last 168 hours. Coagulation Profile: No results for input(s): "INR", "PROTIME" in the last 168 hours. Cardiac Enzymes: No results for input(s): "CKTOTAL", "CKMB", "CKMBINDEX", "TROPONINI" in the last 168 hours. BNP (last 3 results) No results for input(s): "PROBNP" in the last 8760 hours. HbA1C: No results for input(s): "HGBA1C" in the last 72 hours. CBG: Recent Labs  Lab 10/24/23 1617 10/24/23 2107 10/25/23 0040 10/25/23 0354 10/25/23 0826  GLUCAP 180* 213* 137* 115* 129*   Lipid Profile: No results for input(s): "CHOL", "HDL", "LDLCALC", "TRIG", "CHOLHDL", "LDLDIRECT" in the last 72 hours. Thyroid Function Tests: No results for input(s): "TSH", "T4TOTAL", "FREET4", "T3FREE", "THYROIDAB" in the last 72 hours. Anemia Panel: No results for input(s): "VITAMINB12", "FOLATE", "FERRITIN", "TIBC", "IRON", "RETICCTPCT" in the last 72 hours. Sepsis Labs: Recent Labs  Lab 10/20/23 1002  PROCALCITON <0.10    Recent Results (from the past 240 hours)  Resp panel by RT-PCR (RSV, Flu A&B, Covid)  Anterior Nasal Swab     Status: Abnormal   Collection Time: 10/15/23 11:39 AM   Specimen: Anterior Nasal Swab  Result Value Ref Range Status   SARS Coronavirus 2 by RT PCR NEGATIVE NEGATIVE Final    Comment: (NOTE) SARS-CoV-2 target nucleic acids are NOT DETECTED.  The SARS-CoV-2 RNA is generally detectable in upper respiratory specimens during the acute phase of infection. The lowest  concentration of SARS-CoV-2 viral copies this assay can detect is 138 copies/mL. A negative result does not preclude SARS-Cov-2 infection and should not be used as the sole basis for treatment or other patient management decisions. A negative result may occur with  improper specimen collection/handling, submission of specimen other than nasopharyngeal swab, presence of viral mutation(s) within the areas targeted by this assay, and inadequate number of viral copies(<138 copies/mL). A negative result must be combined with clinical observations, patient history, and epidemiological information. The expected result is Negative.  Fact Sheet for Patients:  BloggerCourse.com  Fact Sheet for Healthcare Providers:  SeriousBroker.it  This test is no t yet approved or cleared by the [place] FDA and  has been authorized for detection and/or diagnosis of SARS-CoV-2 by FDA under an Emergency Use Authorization (EUA). This EUA will remain  in effect (meaning this test can be used) for the duration of the COVID-19 declaration under Section 564(b)(1) of the Act, 21 U.S.C.section 360bbb-3(b)(1), unless the authorization is terminated  or revoked sooner.       Influenza A by PCR NEGATIVE NEGATIVE Final   Influenza B by PCR NEGATIVE NEGATIVE Final    Comment: (NOTE) The Xpert Xpress SARS-CoV-2/FLU/RSV plus assay is intended as an aid in the diagnosis of influenza from Nasopharyngeal swab specimens and should not be used as a sole basis for treatment. Nasal  washings and aspirates are unacceptable for Xpert Xpress SARS-CoV-2/FLU/RSV testing.  Fact Sheet for Patients: BloggerCourse.com  Fact Sheet for Healthcare Providers: SeriousBroker.it  This test is not yet approved or cleared by the [place] FDA and has been authorized for detection and/or diagnosis of SARS-CoV-2 by FDA under an Emergency Use Authorization (EUA). This EUA will remain in effect (meaning this test can be used) for the duration of the COVID-19 declaration under Section 564(b)(1) of the Act, 21 U.S.C. section 360bbb-3(b)(1), unless the authorization is terminated or revoked.     Resp Syncytial Virus by PCR POSITIVE (A) NEGATIVE Final    Comment: (NOTE) Fact Sheet for Patients: BloggerCourse.com  Fact Sheet for Healthcare Providers: SeriousBroker.it  This test is not yet approved or cleared by the [place] FDA and has been authorized for detection and/or diagnosis of SARS-CoV-2 by FDA under an Emergency Use Authorization (EUA). This EUA will remain in effect (meaning this test can be used) for the duration of the COVID-19 declaration under Section 564(b)(1) of the Act, 21 U.S.C. section 360bbb-3(b)(1), unless the authorization is terminated or revoked.  Performed at [facility], [address] [place], [place] [address]   SARS Coronavirus 2 by RT PCR (hospital order, performed in [facility] hospital lab) *cepheid single result test* Anterior Nasal Swab     Status: None   Collection Time: 10/15/23  3:56 PM   Specimen: Anterior Nasal Swab  Result Value Ref Range Status   SARS Coronavirus 2 by RT PCR NEGATIVE NEGATIVE Final    Comment: (NOTE) SARS-CoV-2 target nucleic acids are NOT DETECTED.  The SARS-CoV-2 RNA is generally detectable in upper and lower respiratory specimens during the acute phase of infection. The lowest concentration of  SARS-CoV-2 viral copies this assay can detect is 250 copies / mL. A negative result does not preclude SARS-CoV-2 infection and should not be used as the sole basis for treatment or other patient management decisions.  A negative result may occur with improper specimen collection / handling, submission of specimen other than nasopharyngeal swab, presence of viral mutation(s) within the areas targeted by this  assay, and inadequate number of viral copies (<250 copies / mL). A negative result must be combined with clinical observations, patient history, and epidemiological information.  Fact Sheet for Patients:   RoadLapTop.co.za  Fact Sheet for Healthcare Providers: http://kim-miller.com/  This test is not yet approved or  cleared by the [place] FDA and has been authorized for detection and/or diagnosis of SARS-CoV-2 by FDA under an Emergency Use Authorization (EUA).  This EUA will remain in effect (meaning this test can be used) for the duration of the COVID-19 declaration under Section 564(b)(1) of the Act, 21 U.S.C. section 360bbb-3(b)(1), unless the authorization is terminated or revoked sooner.  Performed at [facility], [address], [place], [place] [address]          Radiology Studies: No results found.      Scheduled Meds:  apixaban  5 mg Oral BID   vitamin C  500 mg Oral BID   feeding supplement  237 mL Oral BID BM   feeding supplement (PROSource TF20)  60 mL Per Tube Daily   fluconazole  200 mg Oral Daily   free water  30 mL Per Tube Q4H   gabapentin  300 mg Oral TID   guaiFENesin  600 mg Oral BID   insulin aspart  0-9 Units Subcutaneous TID AC & HS   iron polysaccharides  150 mg Oral Daily   ketorolac  15 mg Intravenous Q6H   leptospermum manuka honey  1 Application Topical Daily   midodrine  5 mg Oral TID WC   multivitamin with minerals  1 tablet Oral Daily   nicotine  14 mg Transdermal  Daily   nutrition supplement (JUVEN)  1 packet Per Tube BID BM   mouth rinse  15 mL Mouth Rinse 4 times per day   pantoprazole  40 mg Oral Daily   polyethylene glycol  17 g Oral Daily   predniSONE  20 mg Oral Q breakfast   sodium chloride flush  10-40 mL Intracatheter Q12H   thiamine  100 mg Oral Daily   Continuous Infusions:  feeding supplement (OSMOLITE 1.5 CAL) 1,000 mL (10/24/23 1740)     LOS: 45 days      Tresa Moore, MD [facility]   If 7PM-7AM, please contact night-coverage  10/25/2023, 10:42 AM

## 2023-10-25 NOTE — Plan of Care (Signed)

## 2023-10-26 DIAGNOSIS — J9621 Acute and chronic respiratory failure with hypoxia: Secondary | ICD-10-CM | POA: Diagnosis not present

## 2023-10-26 LAB — GLUCOSE, CAPILLARY
Glucose-Capillary: 156 mg/dL — ABNORMAL HIGH (ref 70–99)
Glucose-Capillary: 186 mg/dL — ABNORMAL HIGH (ref 70–99)
Glucose-Capillary: 171 mg/dL — ABNORMAL HIGH (ref 70–99)
Glucose-Capillary: 164 mg/dL — ABNORMAL HIGH (ref 70–99)
Glucose-Capillary: 153 mg/dL — ABNORMAL HIGH (ref 70–99)

## 2023-10-26 MED ORDER — PREDNISONE 10 MG PO TABS: 10.0000 mg | ORAL_TABLET | Freq: Every day | ORAL | Status: DC

## 2023-10-26 MED ORDER — PREDNISONE 20 MG PO TABS
20.0000 mg | ORAL_TABLET | Freq: Every day | ORAL | Status: DC
  Administered 2023-10-27 – 2023-10-29 (×3): 20 mg via ORAL
  Filled 2023-10-26 (×3): qty 1

## 2023-10-26 NOTE — Progress Notes (Signed)
 Pt was resting-when I knocked and peaked in-will return later when pt is awake.

## 2023-10-26 NOTE — Progress Notes (Signed)
 PT note  Assisted tech with transition off of [facility] this am.  Generally weak with +1 for transfer with RW and +1 for care.  Fatigued from [facility].  Returned later in PM and declined due to general pain.  Will return at a later time/date.

## 2023-10-26 NOTE — TOC Progression Note (Signed)
 Transition of Care [facility]) - Progression Note    Patient Details  Name: Benjamin Bolton MRN: [id number] Date of Birth: [birth year]  [facility]) CM/SW Contact  Bing Quarry, RN Phone Number: 10/26/2023, 3:57 PM  Clinical Narrative:  2/23:  Ongoing medical issues on 2/22. LTAC declined and recommended SNF placement when patient is able to participate with PT. PT saw today since 2/20,  patient was generally very weak still per PT short note.      Gabriel Cirri MSN RN CM  RN Case Manager [place]  Transitions of Care Direct Dial: [phone] (Weekends Only) [facility] Main Office Phone: [phone] [facility] Fax: [phone] [email or web address]     Expected Discharge Plan: Long Term Acute Care (LTAC)    Expected Discharge Plan and Services                                               Social Determinants of Health (SDOH) Interventions SDOH Screenings   Food Insecurity: No Food Insecurity (09/12/2023)  Recent Concern: Food Insecurity - Food Insecurity Present (08/25/2023)   Received from [facility]  Housing: Unknown (09/12/2023)  Transportation Needs: No Transportation Needs (09/12/2023)  Recent Concern: Transportation Needs - Unmet Transportation Needs (08/25/2023)   Received from [facility]  Utilities: Not At Risk (09/12/2023)  Recent Concern: Utilities - At Risk (07/23/2023)   Received from [facility]  Financial Resource Strain: Low Risk  (08/25/2023)   Received from [facility]  Recent Concern: Financial Resource Strain - Medium Risk (07/23/2023)   Received from [facility]  Physical Activity: Insufficiently Active (08/25/2023)   Received from [facility]  Social Connections: Socially Isolated (08/25/2023)   Received from [facility]  Stress: No Stress Concern Present (08/25/2023)   Received from [facility]   Tobacco Use: High Risk (09/25/2023)  Health Literacy: Inadequate Health Literacy (08/25/2023)   Received from [facility]    Readmission Risk Interventions     No data to display

## 2023-10-26 NOTE — Progress Notes (Signed)
 PROGRESS NOTE    Benjamin Bolton  WUJ:[id number] DOB: 11-16-1960 DOA: 09/10/2023 PCP: [facility]    Brief Narrative:  Benjamin Bolton is a 63 y.o. male with medical history significant of COPD on 3L O2, HLD, pre-DM, DVT on Eliquis, who presents with SOB.  admitted with Acute on Chronic Hypoxic and Hypercapnic Respiratory Failure in the setting of Acute COPD Exacerbation due to RSV Pneumonia requiring intubation and mechanical ventilation on 09/13/2023.  Was successfully extubated on 1/27, currently on his baseline 3L nasal cannula, not requiring vasopressors.  Hospital course has been complicated questionable HAP and metabolic encephalopathy, which has limited his ability to participate with PT.  While he was Lucid after extubation, did make himself DNR/DNI in presence of family and chaplain.  He will remain full scope of medical care.   . Patient is chronically maintained on 20 mg of prednisone which will continue for now but will require a prolonged taper. He is refusing certain aspects of his care which is challenging given his overall comorbidities.    Initial PT recommendations for CIR which were downgraded to SNF due to his ability to participate with physical therapy.  Might need LTAC placement.   Echocardiogram done on 09/14/2023 with low normal EF and indeterminate diastolic parameters.  Troponin peaked at 115.  Did receive pressors intermittently while in ICU.  Cardiology was consulted which later signed off and no further cardiac evaluation indicated at this time.   There was a questionable superimposed HAP which was treated with antibiotics.   Multiple electrolyte abnormalities which include mild hyperkalemia, hyponatremia and hypophosphatemia which were resolved with repletion.   2/5: Vital stable with borderline soft blood pressure, saturating 100% on 2 L of oxygen, temperature of 100.4 recorded over the past 24-hour.  Labs with slight worsening of leukocytosis at 12.5, rest  of the labs stable, CRP improving, CBG elevated. Sputum cultures from 1/22 with normal respiratory flora.  Patient is currently on Zosyn. Very muffled voice and weak cough.  Patient is quite debilitated and is appropriate for LTAC as recommended and PCCM note. Patient is on tube feed through NGT as having postintubation dysphagia.   2/6: Hemodynamically and labs seems stable, mild hypophosphatemia at 2.4.  Anemia panel consistent with anemia of chronic disease with low iron and saturation, ferritin elevated.  Had a bed offer at select LTAC, pending insurance authorization.  Patient is reluctant for PEG tube, stating that improvement in dysphagia and he will like to try more p.o. intake. Small improvement in voice and cough reflex.  Completed the course of Zosyn.   2/7: Hemodynamically stable, slowly improving voice and cough.  Improving CRP.  Pending insurance authorization for LTAC.  Still does not want PEG tube.   2/8: Oxycodone added at his request due to complaint of generalized bodyaches.  Worsening cough and leukocytosis today, repeating chest x-ray.  Ordered a repeat swallow evaluation. Patient just completed course of Zosyn.   2/9: Overnight quite agitated and unable to sleep.  Ativan helped.  Complaining of generalized aches and pain, appears to have very poor insight of his condition.  Labs with mild pseudohyponatremia secondary to hyperglycemia.  Per patient no appetite but p.o. intake slowly improving. Repeat chest x-ray with persistent but improving bilateral pneumonia. CRP again started trending up.  Continue to have some diarrhea, ordered CBC as add-on   2/10: LTAC placement was declined by insurance even with peer to peer review.  They want a definitive feeding plan in place.  Patient with  poor appetite but able to swallow now.  We will try giving him a break from NG tube, he was on a continuous field.  Might pull the NG tube out and see if he can take adequate p.o. intake.  If he  continued to need tube feeding then PEG will be the best option but patient does not want it.   2/11: Patient became febrile again overnight, maximum temperature recorded of 102.  CRP again started trending up, slight worsening of leukocytosis, repeat chest x-ray with increasing infiltrate.  Patient recently received multiple courses of antibiotic.  UA does not look infected.  Infectious disease was also consulted.  Blood, sputum and wound cultures ordered.  Starting on Zosyn Calorie count with p.o. intake started, tube feeding switched to bolus if he is unable to eat p.o. Appeal was made for [facility] decision.   2/12: Maximum temperature recorded over the past 24 hours was 100.2, remained tachycardic, worsening leukocytosis at 14.4, and CRP.  CT chest abdomen and pelvis with continued consolidation in lower lobes bilaterally concerning for pneumonia.  Mild gaseous distention of colon, may reflect mild ileus, repeat respiratory viral panel negative, cultures are pending. Patient agrees to PEG placement, IR was consulted and they are would like to wait until current fever and concern of infection is sorted out.  Patient also need 48 hours of Eliquis break before placement. LTAC appeal still pending   Patient is high risk for worsening comorbidities and mortality, palliative care was consulted.   2/13: Patient remained febrile, NG tube was removed today so he can try improving his p.o. intake, patient will not decide about PEG tube after p.o. challenge.  ID to decide about whether to add further antibiotics, worsening inflammatory markers.  Patient still not ready for hospice.   2/21 s/p PEG tube placement done by IR on 2/18, resume Eliquis on 2/19 p.m. dose after 24 hours as per pharmacy. Resumed PEG tube feeding after 4 hours of insertion.   P2P done, LTAC declined and recommended SNF placement when patient is able to participate with PT. Informed to [facility]   Assessment & Plan:   Principal Problem:    Acute on chronic respiratory failure with hypoxia (HCC) Active Problems:   COPD exacerbation (HCC)   Fever   DVT (deep venous thrombosis) (HCC)   Protein-calorie malnutrition, severe (HCC)   Electrolyte abnormality   Normochromic anemia   Pre-diabetes   Elevated troponin   Tobacco abuse   Diarrhea  # Acute on chronic respiratory failure with hypoxia (HCC) # COPD exacerbation-treated and resolved # RSV pneumonia-treated # Concern of superimposed HAP. S/p prolonged intubation with advanced underlying COPD. Currently saturating well on 2 L of oxygen which is his baseline. Postintubation muffled voice and weak cough. S/p Zosyn till 2/11 for concern of HAP although tracheal cultures with normal respiratory flora.  Inflammatory markers seems improving. Plan: -Continue supplemental oxygen, wean as tolerated -Incentive spirometry and flutter valve -Chest PT   Fever Tmax 101 on 2/12 Patient became febrile again with worsening leukocytosis and CRP.  Repeat chest x-ray with worsening infiltrate, ordered repeat blood, sputum and wound cultures.  Diarrhea has been resolved.  UA does not look infected.  Preliminary blood cultures negative, sputum cultures pending.  Legionella and TB QuantiFERON was also sent by ID CT chest, abdomen and pelvis with concern of bilateral basal opacities/pneumonia. ID consulted, s/p Zosyn, rec to follow off antibiotics. 2/11 blood culture NGTD, 2/11 RVP negative, 2/12 COVID-negative 2/12 RSV positive   #  Suspected candidal esophagitis as per ID Started Diflucan 200 mg p.o. daily, continue for 2 weeks (2/12--2/26)     # COPD exacerbation, resolved On chronic prednisone 20 mg daily   # DVT (deep venous thrombosis) (HCC) Patient has history of DVT. 2/14 held Eliquis, started Lovenox for now due to PEG tube placement on Tuesday 2/18 2/19 resume Eliquis today p.m. dose as per pharmacy     # Protein-calorie malnutrition, severe (HCC)  Swallowing much improved.  Started calorie count as p.o. intake slowly improving, appetite remained poor and unable to meet his caloric requirement, losing weight.   NGT was removed today, IR was consulted yesterday for PEG tube placement when he agreed, today he again saying that does not want PEG tube and wants to give himself a p.o. challenge. -Continue to monitor 2/14 agreed for PEG tube placement 2/14 held Eliquis, started Lovenox for now due to PEG tube placement on Monday 2/18 s/p PEG tube insertion, RD consulted for PEG tube feeding. Suspect will need long-term wean from tube feedings  Intractable pain Patient endorses severe pain that is preventing him from working with therapy.  He has unfortunately been denied from long-term acute care hospital.  Will need to take efforts to optimize him for skilled nursing facility.  Optimize pain regimen.  Avoid IV narcotics.     Hypotension, started midodrine 5 mg p.o. 3 times daily with holding parameters on 2/15     Electrolyte abnormality Patient had multiple electrolyte abnormalities during this complicated course of hospitalization which involved mild hyperkalemia, hyponatremia and hypophosphatemia which has been improved. Hypokalemia, potassium repleted. -Continue to monitor-replete as needed   Normochromic anemia Hemoglobin currently stable at 8.7.  Likely due to chronic disease. Anemia panel consistent with anemia of chronic disease -Monitor hemoglobin -Transfuse if below 7   Pre-diabetes A1c of 6.4, CBG currently elevated. Patient is on tube feed and steroid. -Continue with SSI   Elevated troponin Echocardiogram done on 09/14/2023 with 50 to 55% EF, indeterminate diastolic parameter.  Likely secondary to demand ischemia.  Troponin peaked at 115 Initially cardiology was consulted and later the signed off, no intervention required.   Diarrhea Improved.  No abdominal pain but to have mild leukocytosis. High risk for C. Difficile. -Continue with  supportive care   # Iron deficiency, transferrin saturation 13%, started oral iron supplement with vitamin C.   DVT prophylaxis: Apixaban Code Status: DNR Family Communication:None today Disposition Plan: Status is: Inpatient Remains inpatient appropriate because: Multiple acute issues as above.  Will need care facility postdischarge.  LTAC denied.  Will look into skilled nursing facility placement.   Level of care: Telemetry Medical  Consultants:  None  Procedures:  PEG  Antimicrobials: None    Subjective: Seen and examined.  Resting in bed.  Had questions about his diagnosis and ongoing care plan.  Discussed with him in detail.  Objective: Vitals:   10/25/23 1555 10/25/23 2139 10/26/23 0500 10/26/23 0755  BP: 105/74 114/81  104/70  Pulse: 77 77  79  Resp: 18 18  18   Temp: 97.8 F (36.6 C) 98.4 F (36.9 C)  98.4 F (36.9 C)  TempSrc: Oral Oral    SpO2: 100% 100%  100%  Weight:   53.5 kg   Height:        Intake/Output Summary (Last 24 hours) at 10/26/2023 1043 Last data filed at 10/25/2023 2032 Gross per 24 hour  Intake --  Output 250 ml  Net -250 ml   [employer]  10/24/23 0451 10/25/23 0500 10/26/23 0500  Weight: 51 kg 52.2 kg 53.5 kg    Examination:  General exam: NAD.  Weak voice.  Appears chronically ill Respiratory system: Bibasilar crackles.  Normal work of breathing.  3 L Cardiovascular system: S1-S2, RRR, no murmurs, no pedal edema Gastrointestinal system: Soft, NT/ND, normal bowel sounds, + PEG Central nervous system: Alert and oriented. No focal neurological deficits. Extremities: Symmetrically decreased power bilateral Skin: No rashes, lesions or ulcers Psychiatry: Judgement and insight appear normal. Mood & affect appropriate.     Data Reviewed: I have personally reviewed following labs and imaging studies  CBC: Recent Labs  Lab 10/21/23 0450 10/22/23 0446 10/23/23 0700 10/24/23 0459 10/25/23 0419  WBC 10.2 11.3* 11.0* 11.9*  13.9*  HGB 8.1* 8.7* 8.7* 8.7* 9.1*  HCT 25.0* 27.3* 27.7* 27.6* 28.7*  MCV 91.9 92.9 93.6 93.2 92.6  PLT 393 405* 399 411* 426*   Basic Metabolic Panel: Recent Labs  Lab 10/21/23 0450 10/21/23 0943 10/21/23 1802 10/22/23 0446 10/22/23 1701 10/23/23 0700 10/24/23 0459 10/25/23 0419  NA 136  --   --  135  --  137 136 135  K 4.1  --   --  3.9  --  4.2 4.5 4.4  CL 96*  --   --  98  --  98 93* 93*  CO2 29  --   --  27  --  30 32 31  GLUCOSE 116*  --   --  121*  --  194* 121* 95  BUN 14  --   --  13  --  17 19 16   CREATININE 0.54*  --   --  0.56*  --  0.58* 0.62 0.49*  CALCIUM 9.2  --   --  9.0  --  9.3 9.4 9.7  MG 2.0 2.0 2.1 2.0 2.3 2.0  --   --   PHOS 3.2 3.2 3.3 4.0 3.6 3.2  --   --    GFR: Estimated Creatinine Clearance: 72.4 mL/min (A) (by C-G formula based on SCr of 0.49 mg/dL (L)). Liver Function Tests: No results for input(s): "AST", "ALT", "ALKPHOS", "BILITOT", "PROT", "ALBUMIN" in the last 168 hours. No results for input(s): "LIPASE", "AMYLASE" in the last 168 hours. No results for input(s): "AMMONIA" in the last 168 hours. Coagulation Profile: No results for input(s): "INR", "PROTIME" in the last 168 hours. Cardiac Enzymes: No results for input(s): "CKTOTAL", "CKMB", "CKMBINDEX", "TROPONINI" in the last 168 hours. BNP (last 3 results) No results for input(s): "PROBNP" in the last 8760 hours. HbA1C: No results for input(s): "HGBA1C" in the last 72 hours. CBG: Recent Labs  Lab 10/25/23 1624 10/25/23 2018 10/25/23 2326 10/26/23 0342 10/26/23 0752  GLUCAP 180* 151* 202* 156* 186*   Lipid Profile: No results for input(s): "CHOL", "HDL", "LDLCALC", "TRIG", "CHOLHDL", "LDLDIRECT" in the last 72 hours. Thyroid Function Tests: No results for input(s): "TSH", "T4TOTAL", "FREET4", "T3FREE", "THYROIDAB" in the last 72 hours. Anemia Panel: No results for input(s): "VITAMINB12", "FOLATE", "FERRITIN", "TIBC", "IRON", "RETICCTPCT" in the last 72 hours. Sepsis  Labs: Recent Labs  Lab 10/20/23 1002  PROCALCITON <0.10    No results found for this or any previous visit (from the past 240 hours).        Radiology Studies: No results found.      Scheduled Meds:  apixaban  5 mg Oral BID   vitamin C  500 mg Oral BID   feeding supplement  237 mL Oral BID BM  feeding supplement (PROSource TF20)  60 mL Per Tube Daily   fluconazole  200 mg Oral Daily   free water  30 mL Per Tube Q4H   gabapentin  300 mg Oral TID   guaiFENesin  600 mg Oral BID   insulin aspart  0-9 Units Subcutaneous TID AC & HS   iron polysaccharides  150 mg Oral Daily   ketorolac  15 mg Intravenous Q6H   leptospermum manuka honey  1 Application Topical Daily   midodrine  5 mg Oral TID WC   multivitamin with minerals  1 tablet Oral Daily   nicotine  14 mg Transdermal Daily   nutrition supplement (JUVEN)  1 packet Per Tube BID BM   mouth rinse  15 mL Mouth Rinse 4 times per day   pantoprazole  40 mg Oral Daily   polyethylene glycol  17 g Oral Daily   [START ON 10/27/2023] predniSONE  20 mg Oral Q breakfast   sodium chloride flush  10-40 mL Intracatheter Q12H   thiamine  100 mg Oral Daily   Continuous Infusions:  feeding supplement (OSMOLITE 1.5 CAL) 1,000 mL (10/26/23 0839)   promethazine (PHENERGAN) injection (IM or IVPB)       LOS: 46 days      Tresa Moore, MD [facility]   If 7PM-7AM, please contact night-coverage  10/26/2023, 10:43 AM

## 2023-10-26 NOTE — Plan of Care (Signed)
 Patient alert and oriented. Patient seemed slightly anxious about the unknown in regards to his health. Sat and talked to patient about giving himself time and grace to get better. Patient verbalized understanding. Patient tolerating pudding this shift. Encouraged patient to go slowly. Will continue to monitor.  Problem: Education: Goal: Knowledge of General Education information will improve Description: Including pain rating scale, medication(s)/side effects and non-pharmacologic comfort measures Outcome: Progressing   Problem: Clinical Measurements: Goal: Ability to maintain clinical measurements within normal limits will improve Outcome: Progressing   Problem: Clinical Measurements: Goal: Diagnostic test results will improve Outcome: Progressing   Problem: Clinical Measurements: Goal: Respiratory complications will improve Outcome: Progressing   Problem: Activity: Goal: Risk for activity intolerance will decrease Outcome: Progressing   Problem: Nutrition: Goal: Adequate nutrition will be maintained Outcome: Progressing

## 2023-10-26 NOTE — Progress Notes (Signed)
 Visited with pt per consult-pt shared he just wanted to discuss change in code status. Provided information in regards to how that is obtained-pt had pink MOST form provided by another medical team member. Went over form with him and encouraged him to go over it with family and staff before filling out. Charge nurse and primary RN made aware of pt desire to change code status from DNR to Full code. Sisters at bedside and shared they believe he fully understands what the change means. Pt asked this chaplain to ensure his ex wife who he is separated from has zero access to his chart- notified charge Nurse who removed ex wife's information-she should not be called with information only his two sisters and son are to get information per his wishes. Offered compassionate presence and listening ear. Family and pt appreciative of visit

## 2023-10-27 DIAGNOSIS — J9621 Acute and chronic respiratory failure with hypoxia: Secondary | ICD-10-CM | POA: Diagnosis not present

## 2023-10-27 LAB — BASIC METABOLIC PANEL
Anion gap: 9 (ref 5–15)
BUN: 39 mg/dL — ABNORMAL HIGH (ref 8–23)
CO2: 32 mmol/L (ref 22–32)
Calcium: 9.5 mg/dL (ref 8.9–10.3)
Chloride: 97 mmol/L — ABNORMAL LOW (ref 98–111)
Creatinine, Ser: 0.55 mg/dL — ABNORMAL LOW (ref 0.61–1.24)
GFR, Estimated: >60 mL/min (ref >60)
Glucose, Bld: 120 mg/dL — ABNORMAL HIGH (ref 70–99)
Potassium: 4.8 mmol/L (ref 3.5–5.1)
Sodium: 138 mmol/L (ref 135–145)

## 2023-10-27 LAB — CBC WITH DIFFERENTIAL/PLATELET
Abs Immature Granulocytes: 0.47 10*3/uL — ABNORMAL HIGH (ref 0.00–0.07)
Basophils Absolute: 0.1 10*3/uL (ref 0.0–0.1)
Basophils Relative: 0 %
Eosinophils Absolute: 0 10*3/uL (ref 0.0–0.5)
Eosinophils Relative: 0 %
HCT: 28.5 % — ABNORMAL LOW (ref 39.0–52.0)
Hemoglobin: 8.9 g/dL — ABNORMAL LOW (ref 13.0–17.0)
Immature Granulocytes: 3 %
Lymphocytes Relative: 21 %
Lymphs Abs: 2.9 10*3/uL (ref 0.7–4.0)
MCH: 29.9 pg (ref 26.0–34.0)
MCHC: 31.2 g/dL (ref 30.0–36.0)
MCV: 95.6 fL (ref 80.0–100.0)
Monocytes Absolute: 1.1 10*3/uL — ABNORMAL HIGH (ref 0.1–1.0)
Monocytes Relative: 8 %
Neutro Abs: 9.4 10*3/uL — ABNORMAL HIGH (ref 1.7–7.7)
Neutrophils Relative %: 68 %
Platelets: 401 10*3/uL — ABNORMAL HIGH (ref 150–400)
RBC: 2.98 MIL/uL — ABNORMAL LOW (ref 4.22–5.81)
RDW: 16 % — ABNORMAL HIGH (ref 11.5–15.5)
WBC: 13.9 10*3/uL — ABNORMAL HIGH (ref 4.0–10.5)
nRBC: 0.8 % — ABNORMAL HIGH (ref 0.0–0.2)

## 2023-10-27 LAB — URINALYSIS, COMPLETE (UACMP) WITH MICROSCOPIC
Bacteria, UA: NONE SEEN
Bilirubin Urine: NEGATIVE
Glucose, UA: NEGATIVE mg/dL
Hgb urine dipstick: NEGATIVE
Ketones, ur: NEGATIVE mg/dL
Leukocytes,Ua: NEGATIVE
Nitrite: NEGATIVE
Protein, ur: NEGATIVE mg/dL
Specific Gravity, Urine: 1.026 (ref 1.005–1.030)
pH: 5 (ref 5.0–8.0)

## 2023-10-27 LAB — GLUCOSE, CAPILLARY
Glucose-Capillary: 141 mg/dL — ABNORMAL HIGH (ref 70–99)
Glucose-Capillary: 164 mg/dL — ABNORMAL HIGH (ref 70–99)
Glucose-Capillary: 225 mg/dL — ABNORMAL HIGH (ref 70–99)
Glucose-Capillary: 174 mg/dL — ABNORMAL HIGH (ref 70–99)

## 2023-10-27 MED ORDER — OXYBUTYNIN CHLORIDE 5 MG PO TABS
5.0000 mg | ORAL_TABLET | Freq: Three times a day (TID) | ORAL | Status: DC | PRN
  Administered 2023-10-27 – 2023-10-28 (×3): 5 mg via ORAL
  Filled 2023-10-27 (×4): qty 1

## 2023-10-27 MED ORDER — SODIUM CHLORIDE 0.9 % IV SOLN
1.0000 g | INTRAVENOUS | Status: DC
  Administered 2023-10-27: 1 g via INTRAVENOUS
  Filled 2023-10-27: qty 10

## 2023-10-27 NOTE — Plan of Care (Signed)
 Patient without distress. Patient encouraged to express feelings. All needs attended to. Will continue to monitor.   Problem: Education: Goal: Knowledge of General Education information will improve Description: Including pain rating scale, medication(s)/side effects and non-pharmacologic comfort measures Outcome: Progressing   Problem: Health Behavior/Discharge Planning: Goal: Ability to manage health-related needs will improve Outcome: Progressing   Problem: Clinical Measurements: Goal: Ability to maintain clinical measurements within normal limits will improve Outcome: Progressing Goal: Will remain free from infection Outcome: Progressing Goal: Diagnostic test results will improve Outcome: Progressing Goal: Respiratory complications will improve Outcome: Progressing Goal: Cardiovascular complication will be avoided Outcome: Progressing   Problem: Activity: Goal: Risk for activity intolerance will decrease Outcome: Progressing   Problem: Nutrition: Goal: Adequate nutrition will be maintained Outcome: Progressing   Problem: Coping: Goal: Level of anxiety will decrease Outcome: Progressing   Problem: Elimination: Goal: Will not experience complications related to bowel motility Outcome: Progressing Goal: Will not experience complications related to urinary retention Outcome: Progressing   Problem: Pain Management: Goal: General experience of comfort will improve Outcome: Progressing   Problem: Safety: Goal: Ability to remain free from injury will improve Outcome: Progressing   Problem: Skin Integrity: Goal: Risk for impaired skin integrity will decrease Outcome: Progressing   Problem: Education: Goal: Knowledge of disease or condition will improve Outcome: Progressing Goal: Knowledge of the prescribed therapeutic regimen will improve Outcome: Progressing   Problem: Activity: Goal: Ability to tolerate increased activity will improve Outcome:  Progressing Goal: Will verbalize the importance of balancing activity with adequate rest periods Outcome: Progressing   Problem: Respiratory: Goal: Ability to maintain a clear airway will improve Outcome: Progressing Goal: Levels of oxygenation will improve Outcome: Progressing Goal: Ability to maintain adequate ventilation will improve Outcome: Progressing   Problem: Education: Goal: Ability to describe self-care measures that may prevent or decrease complications (Diabetes Survival Skills Education) will improve Outcome: Progressing   Problem: Coping: Goal: Ability to adjust to condition or change in health will improve Outcome: Progressing   Problem: Fluid Volume: Goal: Ability to maintain a balanced intake and output will improve Outcome: Progressing   Problem: Health Behavior/Discharge Planning: Goal: Ability to identify and utilize available resources and services will improve Outcome: Progressing Goal: Ability to manage health-related needs will improve Outcome: Progressing   Problem: Metabolic: Goal: Ability to maintain appropriate glucose levels will improve Outcome: Progressing   Problem: Nutritional: Goal: Maintenance of adequate nutrition will improve Outcome: Progressing Goal: Progress toward achieving an optimal weight will improve Outcome: Progressing   Problem: Skin Integrity: Goal: Risk for impaired skin integrity will decrease Outcome: Progressing   Problem: Tissue Perfusion: Goal: Adequacy of tissue perfusion will improve Outcome: Progressing   Problem: Activity: Goal: Ability to tolerate increased activity will improve Outcome: Progressing   Problem: Respiratory: Goal: Ability to maintain a clear airway and adequate ventilation will improve Outcome: Progressing   Problem: Role Relationship: Goal: Method of communication will improve Outcome: Progressing

## 2023-10-27 NOTE — Progress Notes (Signed)
 Physical Therapy Treatment Patient Details Name: Benjamin Bolton MRN: [id number] DOB: 1961-06-18 Today's Date: 10/27/2023   History of Present Illness 63 y.o. 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    PT Comments  Patient alert, agreeable to PT/OT but very self limiting, needed max encouragement throughout for pt to maximize his participation in mobility. Supine to sit with supervision, bed rails. Able to step pivot with RW and CGA-[id number] for safety. Sit <> Stand from [facility] minA, maxA for pericare and encouraged to ambulate ~57ft in room with close chair follow. Pt up in recliner with needs in reach. The patient would benefit from further skilled PT intervention to continue to progress towards goals.    If plan is discharge home, recommend the following: A lot of help with walking and/or transfers;A lot of help with bathing/dressing/bathroom;Assist for transportation;Assistance with cooking/housework;Help with stairs or ramp for entrance   Can travel by private vehicle     No  Equipment Recommendations  Other (comment) (TBD)    Recommendations for Other Services       Precautions / Restrictions Precautions Precautions: Fall Recall of Precautions/Restrictions: Intact Restrictions Weight Bearing Restrictions Per Provider Order: No     Mobility  Bed Mobility Overal bed mobility: Needs Assistance Bed Mobility: Supine to Sit     Supine to sit: Supervision, HOB elevated          Transfers Overall transfer level: Needs assistance Equipment used: Rolling walker (2 wheels) Transfers: Sit to/from Stand Sit to Stand: Contact guard assist, Min assist (1-2 assist) Stand pivot transfers: Min assist, Mod assist               Ambulation/Gait Ambulation/Gait assistance: Contact guard assist Gait Distance (Feet): 4 Feet Assistive device: Rolling walker (2 wheels)         General Gait Details: needed max encouragement   Stairs             Wheelchair Mobility     Tilt Bed    Modified Rankin (Stroke Patients Only)       Balance Overall balance assessment: Needs assistance Sitting-balance support: Feet supported Sitting balance-Leahy Scale: Good       Standing balance-Leahy Scale: Fair                              Hotel manager: No apparent difficulties  Cognition Arousal: Alert Behavior During Therapy: WFL for tasks assessed/performed   PT - Cognitive impairments: No apparent impairments                         Following commands: Impaired Following commands impaired: Follows one step commands with increased time    Cueing Cueing Techniques: Verbal cues, Tactile cues  Exercises      General Comments        Pertinent Vitals/Pain Pain Assessment Pain Assessment: Faces Faces Pain Scale: Hurts little more Pain Location: generalized, abdomen Pain Descriptors / Indicators: Discomfort Pain Intervention(s): Limited activity within patient's tolerance, Monitored during session, Repositioned    Home Living                          Prior Function  PT Goals (current goals can now be found in the care plan section) Progress towards PT goals: Progressing toward goals    Frequency    Min 1X/week      PT Plan      Co-evaluation PT/OT/SLP Co-Evaluation/Treatment: Yes Reason for Co-Treatment: For patient/therapist safety;To address functional/ADL transfers PT goals addressed during session: Mobility/safety with mobility OT goals addressed during session: ADL's and self-care      AM-PAC PT "6 Clicks" Mobility   Outcome Measure  Help needed turning from your back to your side while in a flat  bed without using bedrails?: A Little Help needed moving from lying on your back to sitting on the side of a flat bed without using bedrails?: A Little Help needed moving to and from a bed to a chair (including a wheelchair)?: A Little Help needed standing up from a chair using your arms (e.g., wheelchair or bedside chair)?: A Little Help needed to walk in hospital room?: A Lot Help needed climbing 3-5 steps with a railing? : A Lot 6 Click Score: 16    End of Session Equipment Utilized During Treatment: Gait belt Activity Tolerance: Patient tolerated treatment well (self limiting) Patient left: with call bell/phone within reach;in chair;with chair alarm set Nurse Communication: Mobility status PT Visit Diagnosis: Unsteadiness on feet (R26.81);Muscle weakness (generalized) (M62.81);Difficulty in walking, not elsewhere classified (R26.2)     Time: 8295-6213 PT Time Calculation (min) (ACUTE ONLY): 29 min  Charges:    $Therapeutic Activity: 8-22 mins PT General Charges $$ ACUTE PT VISIT: 1 Visit                     Olga Coaster PT, DPT 1:36 PM,10/27/23

## 2023-10-27 NOTE — Progress Notes (Signed)
 PROGRESS NOTE    Derek Laughter  NFA:[id number] DOB: 1961-06-14 DOA: 09/10/2023 PCP: [facility]    Brief Narrative:  Benjamin Bolton is a 63 y.o. male with medical history significant of COPD on 3L O2, HLD, pre-DM, DVT on Eliquis, who presents with SOB.  admitted with Acute on Chronic Hypoxic and Hypercapnic Respiratory Failure in the setting of Acute COPD Exacerbation due to RSV Pneumonia requiring intubation and mechanical ventilation on 09/13/2023.  Was successfully extubated on 1/27, currently on his baseline 3L nasal cannula, not requiring vasopressors.  Hospital course has been complicated questionable HAP and metabolic encephalopathy, which has limited his ability to participate with PT.  While he was Lucid after extubation, did make himself DNR/DNI in presence of family and chaplain.  He will remain full scope of medical care.   . Patient is chronically maintained on 20 mg of prednisone which will continue for now but will require a prolonged taper. He is refusing certain aspects of his care which is challenging given his overall comorbidities.    Initial PT recommendations for CIR which were downgraded to SNF due to his ability to participate with physical therapy.  Might need LTAC placement.   Echocardiogram done on 09/14/2023 with low normal EF and indeterminate diastolic parameters.  Troponin peaked at 115.  Did receive pressors intermittently while in ICU.  Cardiology was consulted which later signed off and no further cardiac evaluation indicated at this time.   There was a questionable superimposed HAP which was treated with antibiotics.   Multiple electrolyte abnormalities which include mild hyperkalemia, hyponatremia and hypophosphatemia which were resolved with repletion.   2/5: Vital stable with borderline soft blood pressure, saturating 100% on 2 L of oxygen, temperature of 100.4 recorded over the past 24-hour.  Labs with slight worsening of leukocytosis at 12.5, rest  of the labs stable, CRP improving, CBG elevated. Sputum cultures from 1/22 with normal respiratory flora.  Patient is currently on Zosyn. Very muffled voice and weak cough.  Patient is quite debilitated and is appropriate for LTAC as recommended and PCCM note. Patient is on tube feed through NGT as having postintubation dysphagia.   2/6: Hemodynamically and labs seems stable, mild hypophosphatemia at 2.4.  Anemia panel consistent with anemia of chronic disease with low iron and saturation, ferritin elevated.  Had a bed offer at select LTAC, pending insurance authorization.  Patient is reluctant for PEG tube, stating that improvement in dysphagia and he will like to try more p.o. intake. Small improvement in voice and cough reflex.  Completed the course of Zosyn.   2/7: Hemodynamically stable, slowly improving voice and cough.  Improving CRP.  Pending insurance authorization for LTAC.  Still does not want PEG tube.   2/8: Oxycodone added at his request due to complaint of generalized bodyaches.  Worsening cough and leukocytosis today, repeating chest x-ray.  Ordered a repeat swallow evaluation. Patient just completed course of Zosyn.   2/9: Overnight quite agitated and unable to sleep.  Ativan helped.  Complaining of generalized aches and pain, appears to have very poor insight of his condition.  Labs with mild pseudohyponatremia secondary to hyperglycemia.  Per patient no appetite but p.o. intake slowly improving. Repeat chest x-ray with persistent but improving bilateral pneumonia. CRP again started trending up.  Continue to have some diarrhea, ordered CBC as add-on   2/10: LTAC placement was declined by insurance even with peer to peer review.  They want a definitive feeding plan in place.  Patient with  poor appetite but able to swallow now.  We will try giving him a break from NG tube, he was on a continuous field.  Might pull the NG tube out and see if he can take adequate p.o. intake.  If he  continued to need tube feeding then PEG will be the best option but patient does not want it.   2/11: Patient became febrile again overnight, maximum temperature recorded of 102.  CRP again started trending up, slight worsening of leukocytosis, repeat chest x-ray with increasing infiltrate.  Patient recently received multiple courses of antibiotic.  UA does not look infected.  Infectious disease was also consulted.  Blood, sputum and wound cultures ordered.  Starting on Zosyn Calorie count with p.o. intake started, tube feeding switched to bolus if he is unable to eat p.o. Appeal was made for [facility] decision.   2/12: Maximum temperature recorded over the past 24 hours was 100.2, remained tachycardic, worsening leukocytosis at 14.4, and CRP.  CT chest abdomen and pelvis with continued consolidation in lower lobes bilaterally concerning for pneumonia.  Mild gaseous distention of colon, may reflect mild ileus, repeat respiratory viral panel negative, cultures are pending. Patient agrees to PEG placement, IR was consulted and they are would like to wait until current fever and concern of infection is sorted out.  Patient also need 48 hours of Eliquis break before placement. LTAC appeal still pending   Patient is high risk for worsening comorbidities and mortality, palliative care was consulted.   2/13: Patient remained febrile, NG tube was removed today so he can try improving his p.o. intake, patient will not decide about PEG tube after p.o. challenge.  ID to decide about whether to add further antibiotics, worsening inflammatory markers.  Patient still not ready for hospice.   2/21 s/p PEG tube placement done by IR on 2/18, resume Eliquis on 2/19 p.m. dose after 24 hours as per pharmacy. Resumed PEG tube feeding after 4 hours of insertion.   P2P done, LTAC declined and recommended SNF placement when patient is able to participate with PT. Informed to [facility]   Assessment & Plan:   Principal Problem:    Acute on chronic respiratory failure with hypoxia (HCC) Active Problems:   COPD exacerbation (HCC)   Fever   DVT (deep venous thrombosis) (HCC)   Protein-calorie malnutrition, severe (HCC)   Electrolyte abnormality   Normochromic anemia   Pre-diabetes   Elevated troponin   Tobacco abuse   Diarrhea  # Acute on chronic respiratory failure with hypoxia (HCC) # COPD exacerbation-treated and resolved # RSV pneumonia-treated # Concern of superimposed HAP. S/p prolonged intubation with advanced underlying COPD. Currently saturating well on 2 L of oxygen which is his baseline. Postintubation muffled voice and weak cough. S/p Zosyn till 2/11 for concern of HAP although tracheal cultures with normal respiratory flora.  Inflammatory markers seems improving. Plan: -Continue supplemental oxygen, wean as tolerated -Incentive spirometry and flutter valve -Chest PT   Fever Tmax 101 on 2/12 Patient became febrile again with worsening leukocytosis and CRP.  Repeat chest x-ray with worsening infiltrate, ordered repeat blood, sputum and wound cultures.  Diarrhea has been resolved.  UA does not look infected.  Preliminary blood cultures negative, sputum cultures pending.  Legionella and TB QuantiFERON was also sent by ID CT chest, abdomen and pelvis with concern of bilateral basal opacities/pneumonia. ID consulted, s/p Zosyn, rec to follow off antibiotics. 2/11 blood culture NGTD, 2/11 RVP negative, 2/12 COVID-negative 2/12 RSV positive   #  Suspected candidal esophagitis as per ID Started Diflucan 200 mg p.o. daily, continue for 2 weeks (2/12--2/26)     # COPD exacerbation, resolved On chronic prednisone 20 mg daily   # DVT (deep venous thrombosis) (HCC) Patient has history of DVT. 2/14 held Eliquis, started Lovenox for now due to PEG tube placement on Tuesday 2/18 2/19 resume Eliquis     # Protein-calorie malnutrition, severe (HCC)  Swallowing much improved. Started calorie count as p.o.  intake slowly improving, appetite remained poor and unable to meet his caloric requirement, losing weight.   NGT was removed today, IR was consulted yesterday for PEG tube placement when he agreed, today he again saying that does not want PEG tube and wants to give himself a p.o. challenge. -Continue to monitor 2/14 agreed for PEG tube placement 2/14 held Eliquis, started Lovenox for now due to PEG tube placement on Monday 2/18 s/p PEG tube insertion, RD consulted for PEG tube feeding. Suspect will need long-term wean from tube feedings Essentially medically ready for dc  Intractable pain Patient endorses severe pain that is preventing him from working with therapy.  He has unfortunately been denied from long-term acute care hospital.  Will need to take efforts to optimize him for skilled nursing facility.  Optimize pain regimen.  Avoid IV narcotics.  Pain regimen seems optimized at this time     Hypotension, started midodrine 5 mg p.o. 3 times daily with holding parameters on 2/15     Electrolyte abnormality Patient had multiple electrolyte abnormalities during this complicated course of hospitalization which involved mild hyperkalemia, hyponatremia and hypophosphatemia which has been improved. Hypokalemia, potassium repleted. -Continue to monitor-replete as needed   Normochromic anemia Hemoglobin currently stable at 8.7.  Likely due to chronic disease. Anemia panel consistent with anemia of chronic disease -Monitor hemoglobin -Transfuse if below 7   Pre-diabetes A1c of 6.4, CBG currently elevated. Patient is on tube feed and steroid. -Continue with SSI   Elevated troponin Echocardiogram done on 09/14/2023 with 50 to 55% EF, indeterminate diastolic parameter.  Likely secondary to demand ischemia.  Troponin peaked at 115 Initially cardiology was consulted and later the signed off, no intervention required.   Diarrhea Improved.  No abdominal pain but to have mild  leukocytosis. High risk for C. Difficile. -Continue with supportive care   # Iron deficiency, transferrin saturation 13%, started oral iron supplement with vitamin C.   DVT prophylaxis: Apixaban Code Status: DNR Family Communication:None today Disposition Plan: Status is: Inpatient Remains inpatient appropriate because: Unsafe discharge plan.  Will need skilled nursing facility.  Medically appropriate for dispo at this time   Level of care: Telemetry Medical  Consultants:  None  Procedures:  PEG  Antimicrobials: None    Subjective: Seen and examined.  Patient changed his CODE STATUS back to full code after conversation with the chaplain on 2/23.  Endorses difficulty urinating and constipation  Objective: Vitals:   10/26/23 1528 10/27/23 0012 10/27/23 0500 10/27/23 0752  BP: 108/72 108/77  115/78  Pulse: 87 81  86  Resp: 16 17  15   Temp: 98.1 F (36.7 C) 98.2 F (36.8 C)  97.6 F (36.4 C)  TempSrc: Oral Oral  Oral  SpO2: 100% 100%  100%  Weight:   53 kg   Height:        Intake/Output Summary (Last 24 hours) at 10/27/2023 1100 Last data filed at 10/27/2023 0939 Gross per 24 hour  Intake 7893.67 ml  Output 250 ml  Net 7643.67  ml   Filed Weights   10/25/23 0500 10/26/23 0500 10/27/23 0500  Weight: 52.2 kg 53.5 kg 53 kg    Examination:  General exam: No acute distress.  Weak voice.  Appears chronically ill and frail Respiratory system: Crackles at bases.  Normal work of breathing.  3 L Cardiovascular system: S1-S2, RRR, no murmurs, no pedal edema Gastrointestinal system: Soft, NT/ND, normal bowel sounds, + PEG Central nervous system: Alert and oriented. No focal neurological deficits. Extremities: Symmetrically decreased power bilateral Skin: No rashes, lesions or ulcers Psychiatry: Judgement and insight appear normal. Mood & affect appropriate.     Data Reviewed: I have personally reviewed following labs and imaging studies  CBC: Recent Labs  Lab  10/22/23 0446 10/23/23 0700 10/24/23 0459 10/25/23 0419 10/27/23 0850  WBC 11.3* 11.0* 11.9* 13.9* 13.9*  NEUTROABS  --   --   --   --  9.4*  HGB 8.7* 8.7* 8.7* 9.1* 8.9*  HCT 27.3* 27.7* 27.6* 28.7* 28.5*  MCV 92.9 93.6 93.2 92.6 95.6  PLT 405* 399 411* 426* 401*   Basic Metabolic Panel: Recent Labs  Lab 10/21/23 0943 10/21/23 1802 10/22/23 0446 10/22/23 1701 10/23/23 0700 10/24/23 0459 10/25/23 0419 10/27/23 0850  NA  --   --  135  --  137 136 135 138  K  --   --  3.9  --  4.2 4.5 4.4 4.8  CL  --   --  98  --  98 93* 93* 97*  CO2  --   --  27  --  30 32 31 32  GLUCOSE  --   --  121*  --  194* 121* 95 120*  BUN  --   --  13  --  17 19 16  39*  CREATININE  --   --  0.56*  --  0.58* 0.62 0.49* 0.55*  CALCIUM  --   --  9.0  --  9.3 9.4 9.7 9.5  MG 2.0 2.1 2.0 2.3 2.0  --   --   --   PHOS 3.2 3.3 4.0 3.6 3.2  --   --   --    GFR: Estimated Creatinine Clearance: 71.8 mL/min (A) (by C-G formula based on SCr of 0.55 mg/dL (L)). Liver Function Tests: No results for input(s): "AST", "ALT", "ALKPHOS", "BILITOT", "PROT", "ALBUMIN" in the last 168 hours. No results for input(s): "LIPASE", "AMYLASE" in the last 168 hours. No results for input(s): "AMMONIA" in the last 168 hours. Coagulation Profile: No results for input(s): "INR", "PROTIME" in the last 168 hours. Cardiac Enzymes: No results for input(s): "CKTOTAL", "CKMB", "CKMBINDEX", "TROPONINI" in the last 168 hours. BNP (last 3 results) No results for input(s): "PROBNP" in the last 8760 hours. HbA1C: No results for input(s): "HGBA1C" in the last 72 hours. CBG: Recent Labs  Lab 10/26/23 0752 10/26/23 1220 10/26/23 1725 10/26/23 2104 10/27/23 0753  GLUCAP 186* 171* 164* 153* 141*   Lipid Profile: No results for input(s): "CHOL", "HDL", "LDLCALC", "TRIG", "CHOLHDL", "LDLDIRECT" in the last 72 hours. Thyroid Function Tests: No results for input(s): "TSH", "T4TOTAL", "FREET4", "T3FREE", "THYROIDAB" in the last 72  hours. Anemia Panel: No results for input(s): "VITAMINB12", "FOLATE", "FERRITIN", "TIBC", "IRON", "RETICCTPCT" in the last 72 hours. Sepsis Labs: No results for input(s): "PROCALCITON", "LATICACIDVEN" in the last 168 hours.   No results found for this or any previous visit (from the past 240 hours).        Radiology Studies: No results found.  Scheduled Meds:  apixaban  5 mg Oral BID   vitamin C  500 mg Oral BID   feeding supplement  237 mL Oral BID BM   feeding supplement (PROSource TF20)  60 mL Per Tube Daily   fluconazole  200 mg Oral Daily   free water  30 mL Per Tube Q4H   gabapentin  300 mg Oral TID   guaiFENesin  600 mg Oral BID   insulin aspart  0-9 Units Subcutaneous TID AC & HS   iron polysaccharides  150 mg Oral Daily   ketorolac  15 mg Intravenous Q6H   leptospermum manuka honey  1 Application Topical Daily   midodrine  5 mg Oral TID WC   multivitamin with minerals  1 tablet Oral Daily   nicotine  14 mg Transdermal Daily   nutrition supplement (JUVEN)  1 packet Per Tube BID BM   mouth rinse  15 mL Mouth Rinse 4 times per day   pantoprazole  40 mg Oral Daily   polyethylene glycol  17 g Oral Daily   predniSONE  20 mg Oral Q breakfast   sodium chloride flush  10-40 mL Intracatheter Q12H   thiamine  100 mg Oral Daily   Continuous Infusions:  cefTRIAXone (ROCEPHIN)  IV     feeding supplement (OSMOLITE 1.5 CAL) 60 mL/hr at 10/27/23 0340   promethazine (PHENERGAN) injection (IM or IVPB)       LOS: 47 days      Tresa Moore, MD [facility]   If 7PM-7AM, please contact night-coverage  10/27/2023, 11:00 AM

## 2023-10-27 NOTE — Consult Note (Signed)
 WOC Nurse wound follow up Wound type: sacral pressure injury Unstageable  Measurement: 1.0cm x 0.4cm x 0.1cm  Wound bed: 90% yellow/10% pink at wound edges  Drainage (amount, consistency, odor) not able to assess, no dressing in place  Periwound:intact  Dressing procedure/placement/frequency: Continue medihoney daily [place] to nursing staff requesting dressing be placed.  PEG in place, should improve nutrition Patient reports would like to get on BSD to have BM I have messaged PT/OT about this.   WOC Nurse team will follow with you and see patient within 10 days for wound assessments.  Please notify WOC nurses of any acute changes in the wounds or any new areas of concern Tahesha [facility] MSN, RN,CWOCN, CNS, CWON-AP [phone]

## 2023-10-27 NOTE — Progress Notes (Addendum)
 Occupational Therapy Treatment Patient Details Name: Benjamin Bolton MRN: [id number] DOB: 01/11/1961 Today's Date: 10/27/2023   History of present illness 63 y.o. 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   OT comments  Chart reviewed to date, pt greeted in room, agreeable to OT tx session with encouragement. Pt appears self limiting, improved participation/performance with encouragement however. Improvements noted throughout with pt performing transfer to bsc with CGA-MIN A +2 with RW, frequent vcs for technique, amb in room approx 8' with RW with CGA-MIN A +1-2 with frequent vcs for technique(pt is flexed at the trunk with RW too anterior to him). MAX A required for peri care after BM however pt adjusts hemorrhoids while leaned back in chair with distant supervision. Nurse in room to address breakdown on sacrum. VSS throughout on 3 L via [place]. Pt is left in chair in care of PT, all needs met. Discharge recommendation has been upgraded to reflect progress. OT will continue to follow.        If plan is discharge home, recommend the following:  Help with stairs or ramp for entrance;Assistance with cooking/housework;Assist for transportation;Assistance with feeding;A little help with walking and/or transfers;A lot of help with bathing/dressing/bathroom   Equipment Recommendations  BSC/3in1;Wheelchair (measurements OT);Tub/shower seat    Recommendations for Other Services      Precautions / Restrictions Precautions Precautions: Fall Recall of Precautions/Restrictions: Intact Restrictions Weight Bearing Restrictions Per Provider Order: No       Mobility Bed Mobility Overal bed mobility: Needs Assistance Bed Mobility: Supine to Sit     Supine to sit: Supervision,  HOB elevated          Transfers Overall transfer level: Needs assistance Equipment used: Rolling walker (2 wheels) Transfers: Sit to/from Stand Sit to Stand: Contact guard assist, Min assist (+1-2)                 Balance Overall balance assessment: Needs assistance Sitting-balance support: Feet supported Sitting balance-Leahy Scale: Good     Standing balance support: Bilateral upper extremity supported Standing balance-Leahy Scale: Fair                             ADL either performed or assessed with clinical judgement   ADL Overall ADL's : Needs assistance/impaired     Grooming: Wash/dry face;Sitting;Set up Grooming Details (indicate cue type and reason): on edge of bed             Lower Body Dressing: Maximal assistance Lower Body Dressing Details (indicate cue type and reason): socks Toilet Transfer: +2 for safety/equipment;Minimal assistance;Rolling walker (2 wheels);BSC/3in1 Toilet Transfer Details (indicate cue type and reason): step pivot to bedside commode, frequent vcs for technique Toileting- Clothing Manipulation and Hygiene: Maximal assistance;Sit to/from stand Toileting - Clothing Manipulation Details (indicate cue type and reason): MAX A for peri care after BM, pt addresses his hemmeroids while in chair while bringing his legs upwards/bridging     Functional mobility during ADLs: +2 for physical assistance;+2 for safety/equipment;Rolling walker (2 wheels);Contact guard assist;Minimal assistance (approx 8' in room, frequent vcs for encouragement)      Extremity/Trunk Assessment              Vision       Perception  Praxis     Communication Communication Communication: No apparent difficulties   Cognition Arousal: Alert Behavior During Therapy: WFL for tasks assessed/performed Cognition: No family/caregiver present to determine baseline, Cognition impaired     Awareness: Intellectual awareness impaired, Online  awareness impaired   Attention impairment (select first level of impairment): Selective attention Executive functioning impairment (select all impairments): Problem solving, Reasoning                   Following commands: Impaired Following commands impaired: Follows one step commands with increased time      Cueing   Cueing Techniques: Verbal cues, Tactile cues  Exercises Other Exercises Other Exercises: edu re: role of OT, role of rehab, discharge recommendations, importance of participating in ADL/progressing mobility    Shoulder Instructions       General Comments      Pertinent Vitals/ Pain       Pain Assessment Pain Assessment: Faces Faces Pain Scale: Hurts little more Pain Location: generalized, abdomen Pain Descriptors / Indicators: Discomfort Pain Intervention(s): Monitored during session, Limited activity within patient's tolerance, Repositioned  Home Living                                          Prior Functioning/Environment              Frequency  Min 1X/week        Progress Toward Goals  OT Goals(current goals can now be found in the care plan section)  Progress towards OT goals: Progressing toward goals  Acute Rehab OT Goals Time For Goal Achievement: 10/29/23  Plan      Co-evaluation    PT/OT/SLP Co-Evaluation/Treatment: Yes Reason for Co-Treatment: For patient/therapist safety;To address functional/ADL transfers   OT goals addressed during session: ADL's and self-care      AM-PAC OT "6 Clicks" Daily Activity     Outcome Measure   Help from another person eating meals?: A Little Help from another person taking care of personal grooming?: A Little Help from another person toileting, which includes using toliet, bedpan, or urinal?: A Lot Help from another person bathing (including washing, rinsing, drying)?: A Lot Help from another person to put on and taking off regular upper body clothing?: A  Little Help from another person to put on and taking off regular lower body clothing?: A Lot 6 Click Score: 15    End of Session Equipment Utilized During Treatment: Oxygen;Rolling walker (2 wheels)  OT Visit Diagnosis: Other abnormalities of gait and mobility (R26.89);Muscle weakness (generalized) (M62.81)   Activity Tolerance Patient tolerated treatment well   Patient Left with call bell/phone within reach;in chair;with chair alarm set   Nurse Communication Mobility status        Time: 1610-9604 OT Time Calculation (min): 26 min  Charges: OT General Charges $OT Visit: 1 Visit OT Treatments $Self Care/Home Management : 8-22 mins  Oleta Mouse, OTD OTR/L  10/27/23, 10:47 AM

## 2023-10-28 DIAGNOSIS — J441 Chronic obstructive pulmonary disease with (acute) exacerbation: Secondary | ICD-10-CM | POA: Diagnosis not present

## 2023-10-28 DIAGNOSIS — J9621 Acute and chronic respiratory failure with hypoxia: Secondary | ICD-10-CM | POA: Diagnosis not present

## 2023-10-28 DIAGNOSIS — E43 Unspecified severe protein-calorie malnutrition: Secondary | ICD-10-CM | POA: Diagnosis not present

## 2023-10-28 DIAGNOSIS — Z720 Tobacco use: Secondary | ICD-10-CM | POA: Diagnosis not present

## 2023-10-28 LAB — GLUCOSE, CAPILLARY
Glucose-Capillary: 164 mg/dL — ABNORMAL HIGH (ref 70–99)
Glucose-Capillary: 144 mg/dL — ABNORMAL HIGH (ref 70–99)
Glucose-Capillary: 171 mg/dL — ABNORMAL HIGH (ref 70–99)
Glucose-Capillary: 164 mg/dL — ABNORMAL HIGH (ref 70–99)

## 2023-10-28 MED ORDER — PANTOPRAZOLE SODIUM 40 MG PO TBEC: 40.0000 mg | DELAYED_RELEASE_TABLET | Freq: Every day | ORAL | Status: DC

## 2023-10-28 MED ORDER — ASCORBIC ACID 500 MG PO TABS: 500.0000 mg | ORAL_TABLET | Freq: Two times a day (BID) | ORAL | Status: AC

## 2023-10-28 MED ORDER — OXYCODONE HCL 5 MG PO TABS: 5.0000 mg | ORAL_TABLET | ORAL | 0 refills | Status: DC | PRN

## 2023-10-28 MED ORDER — HYDROXYZINE HCL 25 MG PO TABS: 25.0000 mg | ORAL_TABLET | Freq: Three times a day (TID) | ORAL | Status: DC | PRN

## 2023-10-28 MED ORDER — FREE WATER: 30.0000 mL | Status: DC

## 2023-10-28 MED ORDER — NICOTINE 14 MG/24HR TD PT24: 14.0000 mg | MEDICATED_PATCH | Freq: Every day | TRANSDERMAL | Status: AC

## 2023-10-28 MED ORDER — ADULT MULTIVITAMIN W/MINERALS CH: 1.0000 | ORAL_TABLET | Freq: Every day | ORAL | Status: AC

## 2023-10-28 MED ORDER — OSMOLITE 1.5 CAL PO LIQD: 1000.0000 mL | ORAL | Status: DC

## 2023-10-28 MED ORDER — GUAIFENESIN ER 600 MG PO TB12: 600.0000 mg | ORAL_TABLET | Freq: Two times a day (BID) | ORAL | Status: AC | PRN

## 2023-10-28 MED ORDER — APIXABAN 5 MG PO TABS: 5.0000 mg | ORAL_TABLET | Freq: Two times a day (BID) | ORAL | Status: AC

## 2023-10-28 MED ORDER — PROSOURCE TF20 ENFIT COMPATIBL EN LIQD: 60.0000 mL | Freq: Every day | ENTERAL | Status: DC

## 2023-10-28 MED ORDER — OXYBUTYNIN CHLORIDE 5 MG PO TABS: 5.0000 mg | ORAL_TABLET | Freq: Three times a day (TID) | ORAL | Status: AC | PRN

## 2023-10-28 MED ORDER — ACETAMINOPHEN 325 MG PO TABS: 650.0000 mg | ORAL_TABLET | Freq: Four times a day (QID) | ORAL | Status: AC | PRN

## 2023-10-28 MED ORDER — MIDODRINE HCL 5 MG PO TABS: 5.0000 mg | ORAL_TABLET | Freq: Three times a day (TID) | ORAL | Status: AC

## 2023-10-28 MED ORDER — GABAPENTIN 300 MG PO CAPS: 300.0000 mg | ORAL_CAPSULE | Freq: Three times a day (TID) | ORAL | Status: AC

## 2023-10-28 MED ORDER — POLYSACCHARIDE IRON COMPLEX 150 MG PO CAPS: 150.0000 mg | ORAL_CAPSULE | Freq: Every day | ORAL | Status: AC

## 2023-10-28 MED ORDER — ENSURE ENLIVE PO LIQD: 237.0000 mL | Freq: Two times a day (BID) | ORAL | Status: DC

## 2023-10-28 NOTE — TOC Progression Note (Signed)
 Transition of Care [facility]) - Progression Note    Patient Details  Name: Benjamin Bolton MRN: [id number] Date of Birth: 03/26/1961  [facility] CM/SW Contact  Marlowe Sax, RN Phone Number: 10/28/2023, 2:32 PM  Clinical Narrative:    Met with the patient and reviewed the bed offers to go to STR, reviewed the Medicare Star rating, he chose [employer], I notified Dabe at [facility] and Ins is pending to DC   Expected Discharge Plan: Long Term Acute Care (LTAC)    Expected Discharge Plan and Services                                               Social Determinants of Health (SDOH) Interventions SDOH Screenings   Food Insecurity: No Food Insecurity (09/12/2023)  Recent Concern: Food Insecurity - Food Insecurity Present (08/25/2023)   Received from [facility]  Housing: Unknown (09/12/2023)  Transportation Needs: No Transportation Needs (09/12/2023)  Recent Concern: Transportation Needs - Unmet Transportation Needs (08/25/2023)   Received from [facility]  Utilities: Not At Risk (09/12/2023)  Recent Concern: Utilities - At Risk (07/23/2023)   Received from [facility]  Financial Resource Strain: Low Risk  (08/25/2023)   Received from [facility]  Recent Concern: Financial Resource Strain - Medium Risk (07/23/2023)   Received from [facility]  Physical Activity: Insufficiently Active (08/25/2023)   Received from [facility]  Social Connections: Socially Isolated (08/25/2023)   Received from [facility]  Stress: No Stress Concern Present (08/25/2023)   Received from [facility]  Tobacco Use: High Risk (09/25/2023)  Health Literacy: Inadequate Health Literacy (08/25/2023)   Received from [facility]    Readmission Risk Interventions     No data to display

## 2023-10-28 NOTE — Progress Notes (Signed)
 Patient called this RN to the room, c/o SOB. Pt is able to talk in full sentences and breathing is unlabored and regular. Lung sounds diminished and no changes from previous shift. Pt is 100% on 3L at this time. Notified MD via epic chat. Pt given PRN meds for anxiety.

## 2023-10-28 NOTE — TOC Progression Note (Signed)
 Transition of Care [facility]) - Progression Note    Patient Details  Name: Benjamin Bolton MRN: [id number] Date of Birth: 05-27-1961  Transition of Care [facility]) CM/SW Contact  Marlowe Sax, RN Phone Number: 10/28/2023, 3:03 PM  Clinical Narrative:     Approved Plan Auth ID: [id number] Dates:2.25-2.27 next review date: 2.27.2025   Expected Discharge Plan: Long Term Acute Care (LTAC)    Expected Discharge Plan and Services                                               Social Determinants of Health (SDOH) Interventions SDOH Screenings   Food Insecurity: No Food Insecurity (09/12/2023)  Recent Concern: Food Insecurity - Food Insecurity Present (08/25/2023)   Received [facility]  Housing: Unknown (09/12/2023)  Transportation Needs: No Transportation Needs (09/12/2023)  Recent Concern: Transportation Needs - Unmet Transportation Needs (08/25/2023)   Received from [facility]  Utilities: Not At Risk (09/12/2023)  Recent Concern: Utilities - At Risk (07/23/2023)   Received from [facility]  Financial Resource Strain: Low Risk  (08/25/2023)   Received from [facility]  Recent Concern: Financial Resource Strain - Medium Risk (07/23/2023)   Received from [facility]  Physical Activity: Insufficiently Active (08/25/2023)   Received from [facility]  Social Connections: Socially Isolated (08/25/2023)   Received from [facility]  Stress: No Stress Concern Present (08/25/2023)   Received from [facility]  Tobacco Use: High Risk (09/25/2023)  Health Literacy: Inadequate Health Literacy (08/25/2023)   Received from [facility]    Readmission Risk Interventions     No data to display

## 2023-10-28 NOTE — Progress Notes (Signed)
 PROGRESS NOTE    Benjamin Bolton  JXB:[id number] DOB: Mar 09, 1961 DOA: 09/10/2023 PCP: [facility]    Brief Narrative:  Benjamin Bolton is a 63 y.o. male with medical history significant of COPD on 3L O2, HLD, pre-DM, DVT on Eliquis, who presents with SOB.  admitted with Acute on Chronic Hypoxic and Hypercapnic Respiratory Failure in the setting of Acute COPD Exacerbation due to RSV Pneumonia requiring intubation and mechanical ventilation on 09/13/2023.  Was successfully extubated on 1/27, currently on his baseline 3L nasal cannula, not requiring vasopressors.  Hospital course has been complicated questionable HAP and metabolic encephalopathy, which has limited his ability to participate with PT.  While he was Lucid after extubation, did make himself DNR/DNI in presence of family and chaplain.  He will remain full scope of medical care.   . Patient is chronically maintained on 20 mg of prednisone which will continue for now but will require a prolonged taper. He is refusing certain aspects of his care which is challenging given his overall comorbidities.    Initial PT recommendations for CIR which were downgraded to SNF due to his ability to participate with physical therapy.  Might need LTAC placement.   Echocardiogram done on 09/14/2023 with low normal EF and indeterminate diastolic parameters.  Troponin peaked at 115.  Did receive pressors intermittently while in ICU.  Cardiology was consulted which later signed off and no further cardiac evaluation indicated at this time.   There was a questionable superimposed HAP which was treated with antibiotics.   Multiple electrolyte abnormalities which include mild hyperkalemia, hyponatremia and hypophosphatemia which were resolved with repletion.   2/5: Vital stable with borderline soft blood pressure, saturating 100% on 2 L of oxygen, temperature of 100.4 recorded over the past 24-hour.  Labs with slight worsening of leukocytosis at 12.5, rest  of the labs stable, CRP improving, CBG elevated. Sputum cultures from 1/22 with normal respiratory flora.  Patient is currently on Zosyn. Very muffled voice and weak cough.  Patient is quite debilitated and is appropriate for LTAC as recommended and PCCM note. Patient is on tube feed through NGT as having postintubation dysphagia.   2/6: Hemodynamically and labs seems stable, mild hypophosphatemia at 2.4.  Anemia panel consistent with anemia of chronic disease with low iron and saturation, ferritin elevated.  Had a bed offer at select LTAC, pending insurance authorization.  Patient is reluctant for PEG tube, stating that improvement in dysphagia and he will like to try more p.o. intake. Small improvement in voice and cough reflex.  Completed the course of Zosyn.   2/7: Hemodynamically stable, slowly improving voice and cough.  Improving CRP.  Pending insurance authorization for LTAC.  Still does not want PEG tube.   2/8: Oxycodone added at his request due to complaint of generalized bodyaches.  Worsening cough and leukocytosis today, repeating chest x-ray.  Ordered a repeat swallow evaluation. Patient just completed course of Zosyn.   2/9: Overnight quite agitated and unable to sleep.  Ativan helped.  Complaining of generalized aches and pain, appears to have very poor insight of his condition.  Labs with mild pseudohyponatremia secondary to hyperglycemia.  Per patient no appetite but p.o. intake slowly improving. Repeat chest x-ray with persistent but improving bilateral pneumonia. CRP again started trending up.  Continue to have some diarrhea, ordered CBC as add-on   2/10: LTAC placement was declined by insurance even with peer to peer review.  They want a definitive feeding plan in place.  Patient with  poor appetite but able to swallow now.  We will try giving him a break from NG tube, he was on a continuous field.  Might pull the NG tube out and see if he can take adequate p.o. intake.  If he  continued to need tube feeding then PEG will be the best option but patient does not want it.   2/11: Patient became febrile again overnight, maximum temperature recorded of 102.  CRP again started trending up, slight worsening of leukocytosis, repeat chest x-ray with increasing infiltrate.  Patient recently received multiple courses of antibiotic.  UA does not look infected.  Infectious disease was also consulted.  Blood, sputum and wound cultures ordered.  Starting on Zosyn Calorie count with p.o. intake started, tube feeding switched to bolus if he is unable to eat p.o. Appeal was made for [facility] decision.   2/12: Maximum temperature recorded over the past 24 hours was 100.2, remained tachycardic, worsening leukocytosis at 14.4, and CRP.  CT chest abdomen and pelvis with continued consolidation in lower lobes bilaterally concerning for pneumonia.  Mild gaseous distention of colon, may reflect mild ileus, repeat respiratory viral panel negative, cultures are pending. Patient agrees to PEG placement, IR was consulted and they are would like to wait until current fever and concern of infection is sorted out.  Patient also need 48 hours of Eliquis break before placement. LTAC appeal still pending   Patient is high risk for worsening comorbidities and mortality, palliative care was consulted.   2/13: Patient remained febrile, NG tube was removed today so he can try improving his p.o. intake, patient will not decide about PEG tube after p.o. challenge.  ID to decide about whether to add further antibiotics, worsening inflammatory markers.  Patient still not ready for hospice.   2/21 s/p PEG tube placement done by IR on 2/18, resume Eliquis on 2/19 p.m. dose after 24 hours as per pharmacy. Resumed PEG tube feeding after 4 hours of insertion.   P2P done, LTAC declined and recommended SNF placement when patient is able to participate with PT. Informed to [facility]  2/25: Patient has rescinded his DNR and changed  back to full code after conversation with the chaplain.  Medical team was not involved in this discussion   Assessment & Plan:   Principal Problem:   Acute on chronic respiratory failure with hypoxia (HCC) Active Problems:   COPD exacerbation (HCC)   Fever   DVT (deep venous thrombosis) (HCC)   Protein-calorie malnutrition, severe (HCC)   Electrolyte abnormality   Normochromic anemia   Pre-diabetes   Elevated troponin   Tobacco abuse   Diarrhea  # Acute on chronic respiratory failure with hypoxia (HCC) # COPD exacerbation-treated and resolved # RSV pneumonia-treated # Concern of superimposed HAP. S/p prolonged intubation with advanced underlying COPD. Currently saturating well on 2 L of oxygen which is his baseline. Postintubation muffled voice and weak cough. S/p Zosyn till 2/11 for concern of HAP although tracheal cultures with normal respiratory flora.  Inflammatory markers seems improving. Plan: -Continue supplemental oxygen, wean as tolerated -Incentive spirometry and flutter valve -Chest PT  # Suspected candidal esophagitis as per ID Started Diflucan 200 mg p.o. daily, continue for 2 weeks (2/12--2/26)     # COPD exacerbation, resolved On chronic prednisone 20 mg daily   # DVT (deep venous thrombosis) (HCC) Patient has history of DVT. 2/14 held Eliquis, started Lovenox for now due to PEG tube placement on Tuesday 2/18 2/19 resume Eliquis     #  Protein-calorie malnutrition, severe (HCC)  Swallowing much improved. Started calorie count as p.o. intake slowly improving, appetite remained poor and unable to meet his caloric requirement, losing weight.   NGT was removed today, IR was consulted yesterday for PEG tube placement when he agreed, today he again saying that does not want PEG tube and wants to give himself a p.o. challenge. -Continue to monitor 2/14 agreed for PEG tube placement 2/14 held Eliquis, started Lovenox for now due to PEG tube placement on  Monday 2/18 s/p PEG tube insertion, RD consulted for PEG tube feeding. Suspect will need long-term wean from tube feedings Essentially medically ready for dc  Intractable pain Patient endorses severe pain that is preventing him from working with therapy.  He has unfortunately been denied from long-term acute care hospital.  Will need to take efforts to optimize him for skilled nursing facility.  Optimize pain regimen.  Avoid IV narcotics.  Pain regimen seems optimized at this time.  Medically ready for discharge     Hypotension, started midodrine 5 mg p.o. 3 times daily with holding parameters on 2/15     Electrolyte abnormality Patient had multiple electrolyte abnormalities during this complicated course of hospitalization which involved mild hyperkalemia, hyponatremia and hypophosphatemia which has been improved. Hypokalemia, potassium repleted. -Continue to monitor-replete as needed   Normochromic anemia Hemoglobin currently stable at 8.7.  Likely due to chronic disease. Anemia panel consistent with anemia of chronic disease -Monitor hemoglobin -Transfuse if below 7   Pre-diabetes A1c of 6.4, CBG currently elevated. Patient is on tube feed and steroid. -Continue with SSI   Elevated troponin Echocardiogram done on 09/14/2023 with 50 to 55% EF, indeterminate diastolic parameter.  Likely secondary to demand ischemia.  Troponin peaked at 115 Initially cardiology was consulted and later the signed off, no intervention required.   Diarrhea Improved.  No abdominal pain   # Iron deficiency, transferrin saturation 13%, started oral iron supplement with vitamin C.   DVT prophylaxis: Apixaban Code Status: DNR Family Communication:None today Disposition Plan: Status is: Inpatient Remains inpatient appropriate because: Unsafe discharge plan.  Will need skilled nursing facility.  Medically appropriate for dispo at this time   Level of care: Telemetry Medical  Consultants:   None  Procedures:  PEG  Antimicrobials: None    Subjective: Seen and examined.  Feeling better today.  He is in good spirits.  Objective: Vitals:   10/27/23 0752 10/27/23 1508 10/27/23 2356 10/28/23 0748  BP: 115/78 116/78 111/80 108/72  Pulse: 86 81 85 91  Resp: 15 16 17 16   Temp: 97.6 F (36.4 C) 98.6 F (37 C) 98.1 F (36.7 C) 98.2 F (36.8 C)  TempSrc: Oral  Oral Oral  SpO2: 100% 100% 100% 99%  Weight:      Height:        Intake/Output Summary (Last 24 hours) at 10/28/2023 1248 Last data filed at 10/28/2023 1012 Gross per 24 hour  Intake 230 ml  Output 300 ml  Net -70 ml   Filed Weights   10/25/23 0500 10/26/23 0500 10/27/23 0500  Weight: 52.2 kg 53.5 kg 53 kg    Examination:  General exam: NAD.  In good spirits this morning.  Continues to appear frail and chronically ill Respiratory system: Diminished breath sounds bilaterally.  Bibasilar crackles.  Normal work of breathing.  3 L Cardiovascular system: S1-S2, RRR, no murmurs, no pedal edema Gastrointestinal system: Soft, NT/ND, normal bowel sounds, + PEG Central nervous system: Alert and oriented. No focal neurological  deficits. Extremities: Symmetrically decreased power bilateral Skin: No rashes, lesions or ulcers Psychiatry: Judgement and insight appear normal. Mood & affect appropriate.     Data Reviewed: I have personally reviewed following labs and imaging studies  CBC: Recent Labs  Lab 10/22/23 0446 10/23/23 0700 10/24/23 0459 10/25/23 0419 10/27/23 0850  WBC 11.3* 11.0* 11.9* 13.9* 13.9*  NEUTROABS  --   --   --   --  9.4*  HGB 8.7* 8.7* 8.7* 9.1* 8.9*  HCT 27.3* 27.7* 27.6* 28.7* 28.5*  MCV 92.9 93.6 93.2 92.6 95.6  PLT 405* 399 411* 426* 401*   Basic Metabolic Panel: Recent Labs  Lab 10/21/23 1802 10/22/23 0446 10/22/23 1701 10/23/23 0700 10/24/23 0459 10/25/23 0419 10/27/23 0850  NA  --  135  --  137 136 135 138  K  --  3.9  --  4.2 4.5 4.4 4.8  CL  --  98  --  98 93*  93* 97*  CO2  --  27  --  30 32 31 32  GLUCOSE  --  121*  --  194* 121* 95 120*  BUN  --  13  --  17 19 16  39*  CREATININE  --  0.56*  --  0.58* 0.62 0.49* 0.55*  CALCIUM  --  9.0  --  9.3 9.4 9.7 9.5  MG 2.1 2.0 2.3 2.0  --   --   --   PHOS 3.3 4.0 3.6 3.2  --   --   --    GFR: Estimated Creatinine Clearance: 71.8 mL/min (A) (by C-G formula based on SCr of 0.55 mg/dL (L)). Liver Function Tests: No results for input(s): "AST", "ALT", "ALKPHOS", "BILITOT", "PROT", "ALBUMIN" in the last 168 hours. No results for input(s): "LIPASE", "AMYLASE" in the last 168 hours. No results for input(s): "AMMONIA" in the last 168 hours. Coagulation Profile: No results for input(s): "INR", "PROTIME" in the last 168 hours. Cardiac Enzymes: No results for input(s): "CKTOTAL", "CKMB", "CKMBINDEX", "TROPONINI" in the last 168 hours. BNP (last 3 results) No results for input(s): "PROBNP" in the last 8760 hours. HbA1C: No results for input(s): "HGBA1C" in the last 72 hours. CBG: Recent Labs  Lab 10/27/23 1127 10/27/23 1609 10/27/23 2106 10/28/23 0751 10/28/23 1121  GLUCAP 164* 225* 174* 164* 144*   Lipid Profile: No results for input(s): "CHOL", "HDL", "LDLCALC", "TRIG", "CHOLHDL", "LDLDIRECT" in the last 72 hours. Thyroid Function Tests: No results for input(s): "TSH", "T4TOTAL", "FREET4", "T3FREE", "THYROIDAB" in the last 72 hours. Anemia Panel: No results for input(s): "VITAMINB12", "FOLATE", "FERRITIN", "TIBC", "IRON", "RETICCTPCT" in the last 72 hours. Sepsis Labs: No results for input(s): "PROCALCITON", "LATICACIDVEN" in the last 168 hours.   No results found for this or any previous visit (from the past 240 hours).        Radiology Studies: No results found.      Scheduled Meds:  apixaban  5 mg Oral BID   vitamin C  500 mg Oral BID   feeding supplement  237 mL Oral BID BM   feeding supplement (PROSource TF20)  60 mL Per Tube Daily   free water  30 mL Per Tube Q4H    gabapentin  300 mg Oral TID   guaiFENesin  600 mg Oral BID   insulin aspart  0-9 Units Subcutaneous TID AC & HS   iron polysaccharides  150 mg Oral Daily   ketorolac  15 mg Intravenous Q6H   leptospermum manuka honey  1 Application Topical Daily  midodrine  5 mg Oral TID WC   multivitamin with minerals  1 tablet Oral Daily   nicotine  14 mg Transdermal Daily   nutrition supplement (JUVEN)  1 packet Per Tube BID BM   mouth rinse  15 mL Mouth Rinse 4 times per day   pantoprazole  40 mg Oral Daily   polyethylene glycol  17 g Oral Daily   predniSONE  20 mg Oral Q breakfast   sodium chloride flush  10-40 mL Intracatheter Q12H   Continuous Infusions:  feeding supplement (OSMOLITE 1.5 CAL) 60 mL/hr at 10/28/23 0433   promethazine (PHENERGAN) injection (IM or IVPB)       LOS: 48 days      Tresa Moore, MD [facility]   If 7PM-7AM, please contact night-coverage  10/28/2023, 12:48 PM

## 2023-10-28 NOTE — Progress Notes (Signed)
 Palliative Care Progress Note, Assessment & Plan   Patient Name: Benjamin Bolton       Date: 10/28/2023 DOB: 12-Mar-1961  Age: 63 y.o. MRN#: [id number] Attending Physician: Tresa Moore, MD Primary Care Physician: [facility] Admit Date: 09/10/2023  Subjective: Patient is out of bed and sitting in the recliner.  He acknowledges my presence and is able to make his wishes known.  He is alert and oriented x 4.  No family or friends present during my visit.  HPI: 63 y.o. 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  Patient has been successfully extubated, however has required NG tube for nutritional support.  He remains weak and intermittently compliant with PT/OT/medication regimen compliance.  Patient has been declined potential for admission to [facility] inpatient rehab and LTACH. Plan now for SNF for STR.   Summary of counseling/coordination of care: Extensive chart review completed prior to meeting patient including labs, vital signs, imaging, progress notes, orders, and available advanced directive documents from current and previous encounters.   After reviewing the patient's chart and assessing the patient at bedside, I spoke with patient in regards to symptom management and goals of care.   Symptoms assessed.  Patient endorses he has felt mild shortness of breath since this morning.  He endorses it is not significant and feels as if it his regular COPD.  He has not had a breathing treatment in several days and shares that he can feel the difference.  He says he has requested 1 but no  one has brought wound.  Spoke with nursing and requested breathing treatment be given now.  Additionally, patient shares that he has great difficulty having a bowel movement while using the bedpan.  He shares that he is weak but is determined that with some help he can get to the bedside commode to have his bowel movements.  Otherwise, he endorses that he gets constipated since he is unable to pass stool in the bed/on a bedpan.  Order placed for nursing to have patient out of bed to bedside commode for toileting.  No other adjustment to [facility] needed at this time.  I again discussed values and boundaries imported to the patient.  Patient has endorsed DNR with limited interventions to me during several of my previous visits.  However, patient has shifted his views and is a full code.  I asked patient to share/elaborate more on what made him change his mind.  He shares that he wishes to be given the chance for resuscitative measures should this occur again.  He endorses that he is accepting of the ventilator for temporary support.  He was clear that he would never be accepting of a tracheostomy or PEG tube for long-term support.    We discussed that patient currently has a PEG tube.  He is hopeful that this can be discontinued soon.  He shares that he has been working to eat as much as he possibly can.  He discusses that he grazes throughout the day and  never eats 1 large meal.  We again discussed that patient needs food for nutritional support and it also supports his recovery.  He continues to say he is doing the best that he can.  I confirmed with patient that he wishes to remain a full code at this time.  He is accepting of Disotate of measures in the event of a cardiopulmonary arrest.  Additionally, he shares he would be accepting of a ventilator for temporary respiratory support should he need that.  He speaks of his faith in God and that God still has worked for him to do.  Discussed human mortality  and the boundaries of the human body to sustain itself given chronic illness.  He shares understanding and appreciation for our discussion.  Full code remains.  PMT will continue to follow and support patient throughout his hospitalization.  Physical Exam Vitals reviewed.  Constitutional:      General: He is not in acute distress.    Comments: thin  HENT:     Head: Normocephalic.     Mouth/Throat:     Mouth: Mucous membranes are moist.  Eyes:     Pupils: Pupils are equal, round, and reactive to light.  Cardiovascular:     Rate and Benjamin: Normal rate.  Pulmonary:     Effort: Pulmonary effort is normal.     Breath sounds: Wheezing present.     Comments: Aynor in place Abdominal:     Comments: PEG in place  Musculoskeletal:     Cervical back: Normal range of motion.     Comments: Generalized weakness MAETC  Skin:    General: Skin is warm and dry.  Neurological:     Mental Status: He is alert and oriented to person, place, and time.  Psychiatric:        Mood and Affect: Mood normal. Mood is not anxious.        Behavior: Behavior normal. Behavior is not agitated.             Total Time 35 minutes   Time spent includes: Detailed review of medical records (labs, imaging, vital signs), medically appropriate exam (mental status, respiratory, cardiac, skin), discussed with treatment team, counseling and educating patient, family and staff, documenting clinical information, medication management and coordination of care.  Samara Deist L. Bonita Quin, DNP, FNP-BC Palliative Medicine Team

## 2023-10-29 ENCOUNTER — Other Ambulatory Visit: Payer: Self-pay

## 2023-10-29 ENCOUNTER — Emergency Department
Admission: EM | Admit: 2023-10-29 | Discharge: 2023-10-29 | Disposition: A | Discharge status: Home / Self Care | Payer: 59 | Attending: Emergency Medicine | Admitting: Emergency Medicine

## 2023-10-29 DIAGNOSIS — R3915 Urgency of urination: Secondary | ICD-10-CM | POA: Insufficient documentation

## 2023-10-29 DIAGNOSIS — J9621 Acute and chronic respiratory failure with hypoxia: Secondary | ICD-10-CM | POA: Diagnosis not present

## 2023-10-29 LAB — BASIC METABOLIC PANEL
Anion gap: 10 (ref 5–15)
BUN: 37 mg/dL — ABNORMAL HIGH (ref 8–23)
CO2: 29 mmol/L (ref 22–32)
Calcium: 9.7 mg/dL (ref 8.9–10.3)
Chloride: 97 mmol/L — ABNORMAL LOW (ref 98–111)
Creatinine, Ser: 0.66 mg/dL (ref 0.61–1.24)
GFR, Estimated: >60 mL/min (ref >60)
Glucose, Bld: 120 mg/dL — ABNORMAL HIGH (ref 70–99)
Potassium: 5 mmol/L (ref 3.5–5.1)
Sodium: 136 mmol/L (ref 135–145)

## 2023-10-29 LAB — CBC WITH DIFFERENTIAL/PLATELET
Abs Immature Granulocytes: 0.63 10*3/uL — ABNORMAL HIGH (ref 0.00–0.07)
Basophils Absolute: 0.1 10*3/uL (ref 0.0–0.1)
Basophils Relative: 0 %
Eosinophils Absolute: 0 10*3/uL (ref 0.0–0.5)
Eosinophils Relative: 0 %
HCT: 27.6 % — ABNORMAL LOW (ref 39.0–52.0)
Hemoglobin: 8.4 g/dL — ABNORMAL LOW (ref 13.0–17.0)
Immature Granulocytes: 4 %
Lymphocytes Relative: 20 %
Lymphs Abs: 2.9 10*3/uL (ref 0.7–4.0)
MCH: 29.6 pg (ref 26.0–34.0)
MCHC: 30.4 g/dL (ref 30.0–36.0)
MCV: 97.2 fL (ref 80.0–100.0)
Monocytes Absolute: 1.3 10*3/uL — ABNORMAL HIGH (ref 0.1–1.0)
Monocytes Relative: 9 %
Neutro Abs: 9.7 10*3/uL — ABNORMAL HIGH (ref 1.7–7.7)
Neutrophils Relative %: 67 %
Platelets: 352 10*3/uL (ref 150–400)
RBC: 2.84 MIL/uL — ABNORMAL LOW (ref 4.22–5.81)
RDW: 16.6 % — ABNORMAL HIGH (ref 11.5–15.5)
WBC: 14.6 10*3/uL — ABNORMAL HIGH (ref 4.0–10.5)
nRBC: 1 % — ABNORMAL HIGH (ref 0.0–0.2)

## 2023-10-29 LAB — URINALYSIS, ROUTINE W REFLEX MICROSCOPIC
Bilirubin Urine: NEGATIVE
Glucose, UA: NEGATIVE mg/dL
Hgb urine dipstick: NEGATIVE
Ketones, ur: NEGATIVE mg/dL
Leukocytes,Ua: NEGATIVE
Nitrite: NEGATIVE
Protein, ur: NEGATIVE mg/dL
Specific Gravity, Urine: 1.015 (ref 1.005–1.030)
pH: 8 (ref 5.0–8.0)

## 2023-10-29 LAB — GLUCOSE, CAPILLARY
Glucose-Capillary: 127 mg/dL — ABNORMAL HIGH (ref 70–99)
Glucose-Capillary: 180 mg/dL — ABNORMAL HIGH (ref 70–99)

## 2023-10-29 MED ORDER — TAMSULOSIN HCL 0.4 MG PO CAPS
0.4000 mg | ORAL_CAPSULE | Freq: Once | ORAL | Status: AC
  Administered 2023-10-29: 0.4 mg via ORAL
  Filled 2023-10-29: qty 1

## 2023-10-29 MED ORDER — TAMSULOSIN HCL 0.4 MG PO CAPS: 0.4000 mg | ORAL_CAPSULE | Freq: Every day | ORAL | 0 refills | Status: AC

## 2023-10-29 NOTE — Discharge Summary (Signed)
 [facility] Discharge Summary   [name] WUJ:[id number]  PCP: [facility]  Date of admission: 09/10/2023   Date of discharge: 10/29/2023     Discharge Diagnoses:  Principal Problem:   Acute on chronic respiratory failure with hypoxia (HCC) Active Problems:   COPD exacerbation (HCC)   Fever   DVT (deep venous thrombosis) (HCC)   Protein-calorie malnutrition, severe (HCC)   Electrolyte abnormality   Normochromic anemia   Pre-diabetes   Elevated troponin   Tobacco abuse   Diarrhea   Admitted From: Home Disposition:  SNF   Recommendations for Outpatient Follow-up:  F/u with PCP, should be seen by an MD in 1-2 days, monitor BP and Titrate Midodrin Follow up LABS/TEST:     Contact information for after-discharge care     Destination     [facility] Preferred SNF .   Service: Skilled Nursing Contact information: [address] [place] [address] [phone]                    Diet recommendation: Carb modified diet and PEG tube feeding  Activity: The patient is advised to gradually reintroduce usual activities, as tolerated  Discharge Condition: stable  Code Status: Full code   History of present illness: As per the H and P dictated on admission Hospital Course:  Benjamin Bolton is a 63 y.o. male with medical history significant of COPD on 3L O2, HLD, pre-DM, DVT on Eliquis, who presents with SOB.  admitted with Acute on Chronic Hypoxic and Hypercapnic Respiratory Failure in the setting of Acute COPD Exacerbation due to RSV Pneumonia requiring intubation and mechanical ventilation on 09/13/2023.  Was successfully extubated on 1/27, currently on his baseline 3L nasal cannula, not requiring vasopressors.  Hospital course has been complicated questionable HAP and metabolic encephalopathy, which has limited his ability to participate with PT.  While  he was Lucid after extubation, did make himself DNR/DNI in presence of family and chaplain.  He will remain full scope of medical care.   . Patient is chronically maintained on 20 mg of prednisone which will continue for now but will require a prolonged taper. He is refusing certain aspects of his care which is challenging given his overall comorbidities.    Initial PT recommendations for CIR which were downgraded to SNF due to his ability to participate with physical therapy.  Might need LTAC placement.   Echocardiogram done on 09/14/2023 with low normal EF and indeterminate diastolic parameters.  Troponin peaked at 115.  Did receive pressors intermittently while in ICU.  Cardiology was consulted which later signed off and no further cardiac evaluation indicated at this time.   There was a questionable superimposed HAP which was treated with antibiotics.   Multiple electrolyte abnormalities which include mild hyperkalemia, hyponatremia and hypophosphatemia which were resolved with repletion.   Assessment & Plan:  # Acute on chronic respiratory failure with hypoxia  # COPD exacerbation-treated and resolved # RSV pneumonia-treated # Concern of superimposed HAP. S/p prolonged intubation with advanced underlying COPD. Currently saturating well on 2 L of oxygen which is his baseline. Postintubation muffled voice and weak cough. S/p Zosyn till 2/11 for concern of HAP although tracheal cultures with normal respiratory flora.  Inflammatory markers seems improving. Continue supplemental oxygen, 2 L at baseline # Suspected candidal esophagitis as per ID S/p Diflucan 200 mg p.o. daily, x 2 weeks (2/12--2/26) # COPD exacerbation, resolved, On chronic  prednisone 20 mg daily Continue Trelegy inhaler and albuterol as needed # DVT (deep venous thrombosis) Patient has history of DVT. 2/14 held Eliquis, s/p Lovenox due to PEG tube placement on Tuesday 2/18 On 2/19 resume Eliquis # Protein-calorie  malnutrition, severe:  Swallowing much improved. Started calorie count as p.o. intake slowly improving, appetite remained poor and unable to meet his caloric requirement, losing weight.   NGT was removed. On 2/14 agreed for PEG tube placement. On 2/14 held Eliquis, s/p Lovenox due to PEG tube. On 2/18 s/p PEG tube insertion, RD consulted for PEG tube feeding. Suspect will need long-term wean from tube feedings. Essentially medically ready for dc # Intractable pain: Patient endorses severe pain that is preventing him from working with therapy.  LTACH denied, accepted by SNF facility.  Optimize pain regimen.  Avoid IV narcotics.  Pain regimen seems optimized at this time.  Medically ready for discharge # Hypotension, started midodrine 5 mg p.o. 3 times daily with holding parameters on 2/15 # Electrolyte abnormality: Patient had multiple electrolyte abnormalities during this complicated course of hospitalization which involved mild hyperkalemia, hyponatremia and hypophosphatemia which has been improved. Hypokalemia, potassium repleted. # Normochromic anemia: Hb 8.4, close to baseline, Likely due to chronic disease. Anemia panel consistent with anemia of chronic disease # Pre-diabetes: A1c of 6.4, CBG currently elevated. Patient is on tube feed and steroid. S/p SSI.  Continue diabetic diet and monitor CBG periodically # Elevated troponin: Echocardiogram done on 09/14/2023 with 50 to 55% EF, indeterminate diastolic parameter.  Likely secondary to demand ischemia.  Troponin peaked at 115. Initially cardiology was consulted and later the signed off, no intervention required. # Diarrhea: Improved.  No abdominal pain # Iron deficiency, transferrin saturation 13%, started oral iron supplement with vitamin C.   Body mass index is 16.98 kg/m.  Nutrition Problem: Severe Malnutrition Etiology: acute illness Nutrition Interventions: Interventions: Refer to RD note for recommendations  Pressure Injury 10/08/23  Sacrum Mid Unstageable - Full thickness tissue loss in which the base of the injury is covered by slough (yellow, tan, gray, green or brown) and/or eschar (tan, brown or black) in the wound bed. (Active)  10/08/23 0825  Location: Sacrum  Location Orientation: Mid  Staging: Unstageable - Full thickness tissue loss in which the base of the injury is covered by slough (yellow, tan, gray, green or brown) and/or eschar (tan, brown or black) in the wound bed.  Wound Description (Comments):   Present on Admission: No  Dressing Type Foam - Lift dressing to assess site every shift 10/28/23 0800    - Patient was instructed, not to drive, operate heavy machinery, perform activities at heights, swimming or participation in water activities or provide baby sitting services while on Pain, Sleep and Anxiety Medications; until his outpatient Physician has advised to do so again.  - Also recommended to not to take more than prescribed Pain, Sleep and Anxiety Medications.  Patient was seen by physical therapy, who recommended Therapy, SNF placement,which was arranged. On the day of the discharge the patient's vitals were stable, and no other acute medical condition were reported by patient. the patient was felt safe to be discharge at [facility].  Consultants: PCCM, Cardio, ID and IR Procedures: s/p Extubation and PEG Tube insertion  Discharge Exam: General: Appear in no distress, no Rash; Oral Mucosa Clear, moist. Cardiovascular: S1 and S2 Present, no Murmur, Respiratory: normal respiratory effort, Bilateral Air entry present and no Crackles, no wheezes Abdomen: Bowel Sound present, Soft and no  tenderness, PEG tube intact Extremities: no Pedal edema, no calf tenderness Neurology: alert and oriented to time, place, and person affect appropriate.  Filed Weights   10/27/23 0500 10/28/23 0748 10/29/23 0151  Weight: 53 kg 53 kg 56.8 kg   Vitals:   10/29/23 0852 10/29/23 0928  BP: 121/78   Pulse: (!) 102    Resp: 16   Temp: 98.2 F (36.8 C)   SpO2: 98% 97%    DISCHARGE MEDICATION: Allergies as of 10/29/2023   No Known Allergies      Medication List     STOP taking these medications    amoxicillin-clavulanate 875-125 MG tablet Commonly known as: AUGMENTIN   Apixaban Starter Pack (10mg  and 5mg ) Commonly known as: ELIQUIS STARTER PACK Replaced by: apixaban 5 MG Tabs tablet   Fasenra Pen 30 MG/ML prefilled autoinjector Generic drug: benralizumab   nystatin 100000 UNIT/ML suspension Commonly known as: MYCOSTATIN   omeprazole 20 MG capsule Commonly known as: PRILOSEC       TAKE these medications    acetaminophen 325 MG tablet Commonly known as: TYLENOL Take 2 tablets (650 mg total) by mouth every 6 (six) hours as needed for mild pain (pain score 1-3) or fever.   albuterol 108 (90 Base) MCG/ACT inhaler Commonly known as: VENTOLIN HFA Inhale 1 puff into the lungs every 4 (four) hours as needed for wheezing or shortness of breath.   albuterol (2.5 MG/3ML) 0.083% nebulizer solution Commonly known as: PROVENTIL USE 1 VIAL IN NEBULIZER EVERY 6 HOURS AS NEEDED FOR WHEEZING   apixaban 5 MG Tabs tablet Commonly known as: ELIQUIS Take 1 tablet (5 mg total) by mouth 2 (two) times daily. Replaces: Apixaban Starter Pack (10mg  and 5mg )   ascorbic acid 500 MG tablet Commonly known as: VITAMIN C Take 1 tablet (500 mg total) by mouth 2 (two) times daily.   feeding supplement (PROSource TF20) liquid Place 60 mLs into feeding tube daily.   feeding supplement (OSMOLITE 1.5 CAL) Liqd Place 1,000 mLs into feeding tube continuous.   feeding supplement Liqd Take 237 mLs by mouth 2 (two) times daily between meals.   free water Soln Place 30 mLs into feeding tube every 4 (four) hours.   gabapentin 300 MG capsule Commonly known as: NEURONTIN Take 1 capsule (300 mg total) by mouth 3 (three) times daily.   guaiFENesin 600 MG 12 hr tablet Commonly known as: MUCINEX Take 1  tablet (600 mg total) by mouth 2 (two) times daily as needed for cough or to loosen phlegm.   iron polysaccharides 150 MG capsule Commonly known as: NIFEREX Take 1 capsule (150 mg total) by mouth daily.   midodrine 5 MG tablet Commonly known as: PROAMATINE Take 1 tablet (5 mg total) by mouth 3 (three) times daily with meals.   multivitamin with minerals Tabs tablet Take 1 tablet by mouth daily.   nicotine 14 mg/24hr patch Commonly known as: NICODERM CQ - dosed in mg/24 hours Place 1 patch (14 mg total) onto the skin daily.   ondansetron 4 MG tablet Commonly known as: ZOFRAN Take 1 tablet (4 mg total) by mouth every 6 (six) hours as needed for nausea.   oxybutynin 5 MG tablet Commonly known as: DITROPAN Take 1 tablet (5 mg total) by mouth every 8 (eight) hours as needed for bladder spasms.   oxyCODONE 5 MG immediate release tablet Commonly known as: Oxy IR/ROXICODONE Take 1-2 tablets (5-10 mg total) by mouth every 4 (four) hours as needed for severe pain (pain  score 7-10). SNF use only.  Refills per SNF MD What changed:  additional instructions Another medication with the same name was removed. Continue taking this medication, and follow the directions you see here.   OXYGEN Inhale 3 L into the lungs continuous.   pantoprazole 40 MG tablet Commonly known as: PROTONIX Take 1 tablet (40 mg total) by mouth daily.   predniSONE 20 MG tablet Commonly known as: DELTASONE Take 20 mg by mouth daily with breakfast. What changed: Another medication with the same name was removed. Continue taking this medication, and follow the directions you see here.   Trelegy Ellipta 100-62.5-25 MCG/ACT Aepb Generic drug: Fluticasone-Umeclidin-Vilant Inhale 1 puff into the lungs every morning.               Discharge Care Instructions  (From admission, onward)           Start     Ordered   10/29/23 0000  Discharge wound care:       Comments: As above   10/29/23 1032            No Known Allergies Discharge Instructions     Call MD for:  difficulty breathing, headache or visual disturbances   Complete by: As directed    Call MD for:  extreme fatigue   Complete by: As directed    Call MD for:  persistant dizziness or light-headedness   Complete by: As directed    Call MD for:  persistant nausea and vomiting   Complete by: As directed    Call MD for:  severe uncontrolled pain   Complete by: As directed    Call MD for:  temperature >100.4   Complete by: As directed    Diet general   Complete by: As directed    Discharge instructions   Complete by: As directed    F/u with PCP, should be seen by an MD in 1-2 days, monitor BP and Titrate Midodrine dose   Discharge wound care:   Complete by: As directed    As above   Increase activity slowly   Complete by: As directed        The results of significant diagnostics from this hospitalization (including imaging, microbiology, ancillary and laboratory) are listed below for reference.    Significant Diagnostic Studies: IR GASTROSTOMY TUBE MOD SED Result Date: 10/21/2023 INDICATION: 63 year old male with dysphagia and failure to thrive. He presents for placement of a percutaneous gastrostomy tube. EXAM: Fluoroscopically guided placement of percutaneous balloon retention gastrostomy tube Interventional Radiologist:  Sterling Big, MD MEDICATIONS: 1 mg glucagon administered intravenously by the Radiology nurse ANESTHESIA/SEDATION: Versed 2 mg IV; Fentanyl 100 mcg IV both administered by the radiology nurse Moderate Sedation Time:  20 minutes The patient's vital signs and level of consciousness were continuously monitored during the procedure by the interventional radiology nurse under my direct supervision. CONTRAST:  15mL OMNIPAQUE IOHEXOL 300 MG/ML  SOLN FLUOROSCOPY: Fluoroscopy Time:  minutes  seconds ( mGy). COMPLICATIONS: None immediate. PROCEDURE: Informed written consent was obtained from the patient  after a thorough discussion of the procedural risks, benefits and alternatives. All questions were addressed. Maximal Sterile Barrier Technique was utilized including caps, mask, sterile gowns, sterile gloves, sterile drape, hand hygiene and skin antiseptic. A timeout was performed prior to the initiation of the procedure. Maximal barrier sterile technique utilized including caps, mask, sterile gowns, sterile gloves, large sterile drape, hand hygiene, and chlorhexadine skin prep. An angled catheter was advanced over a wire under  fluoroscopic guidance through the nose, down the esophagus and into the body of the stomach. The stomach was then insufflated with several 100 ml of air. Fluoroscopy confirmed location of the gastric bubble, as well as inferior displacement of the barium stained colon. Under direct fluoroscopic guidance, 2 T tacks were placed, and the anterior gastric wall drawn up against the anterior abdominal wall. Percutaneous access was then obtained into the mid gastric body with an 18 gauge trocar needle. Aspiration of air, and injection of contrast material under fluoroscopy confirmed needle placement. An Amplatz wire was advanced in the gastric body. A 10 x 80 mm atlas balloon was then advanced coaxially through a 20 French balloon retention percutaneous gastrostomy tube. The balloon G-tube construct was then advanced over the wire and the balloon was advanced across the percutaneous tract into the stomach. The balloon was inflated to full effacement. As the balloon was deflated, the balloon and gastrostomy tube were advanced as a unit over the wire and into the gastric lumen. The G tube retention balloon was filled with 10 mL saline and pulled snug against the anterior abdominal wall. The external bumper was fixed in place. Contrast was injected through the tube and confirmed its location within the stomach. The tube was then flushed and capped. The patient will be observed for several hours with  the newly placed tube on low wall suction to evaluate for any post procedure complication. The patient tolerated the procedure well, there is no immediate complication. IMPRESSION: Successful placement of a 20 French balloon retention gastrostomy tube. Electronically Signed   By: Malachy Moan M.D.   On: 10/21/2023 16:38   CT CHEST ABDOMEN PELVIS W CONTRAST Result Date: 10/15/2023 CLINICAL DATA:  Sepsis EXAM: CT CHEST, ABDOMEN, AND PELVIS WITH CONTRAST TECHNIQUE: Multidetector CT imaging of the chest, abdomen and pelvis was performed following the standard protocol during bolus administration of intravenous contrast. RADIATION DOSE REDUCTION: This exam was performed according to the departmental dose-optimization program which includes automated exposure control, adjustment of the mA and/or kV according to patient size and/or use of iterative reconstruction technique. CONTRAST:  80mL OMNIPAQUE IOHEXOL 300 MG/ML  SOLN COMPARISON:  09/25/2023 FINDINGS: CT CHEST FINDINGS Cardiovascular: Heart is normal size. Aorta is normal caliber. Coronary artery and aortic atherosclerosis. Mediastinum/Nodes: No mediastinal, hilar, or axillary adenopathy. Trachea and esophagus are unremarkable. Thyroid unremarkable. Lungs/Pleura: Severe emphysema. Biapical scarring. Consolidation in the lower lobes bilaterally, similar to prior study most suggestive of pneumonia. No effusions. Musculoskeletal: Chest wall soft tissues are unremarkable. No acute bony abnormality. CT ABDOMEN PELVIS FINDINGS Hepatobiliary: No focal hepatic abnormality. Gallbladder unremarkable. Pancreas: No focal abnormality or ductal dilatation. Spleen: No focal abnormality.  Normal size. Adrenals/Urinary Tract: No adrenal abnormality. No focal renal abnormality. No stones or hydronephrosis. Urinary bladder is unremarkable. Stomach/Bowel: NG tube is in the stomach. Mild gaseous distention of the colon. Stomach and small bowel decompressed. Vascular/Lymphatic: No  evidence of aneurysm or adenopathy. Aortic atherosclerosis. Reproductive: No visible focal abnormality. Other: No free fluid or free air. Musculoskeletal: No acute bony abnormality. IMPRESSION: Continued consolidation in the lower lobes bilaterally concerning for pneumonia. Severe emphysema. Coronary artery disease, aortic atherosclerosis. Mild gaseous distention of the colon, particularly transverse colon may reflect mild ileus. Electronically Signed   By: Charlett Nose M.D.   On: 10/15/2023 00:59   DG Chest 2 View Result Date: 10/14/2023 CLINICAL DATA:  Fever and shortness of breath. EXAM: CHEST - 2 VIEW COMPARISON:  Chest radiograph dated 10/11/2023. FINDINGS: Enteric tube extends  below the diaphragm the tip beyond the inferior margin of the image. Bilateral lower lobe opacities again noted and not significantly change. Small pleural effusions suspected. No pneumothorax. The cardiac silhouette is within normal limits. No acute osseous pathology. IMPRESSION: Persistent bilateral lower lobe opacities. Continued follow-up recommended. Electronically Signed   By: Elgie Collard M.D.   On: 10/14/2023 13:49   DG Chest Port 1 View Result Date: 10/11/2023 CLINICAL DATA:  Worsening cough. EXAM: PORTABLE CHEST 1 VIEW COMPARISON:  Chest x-ray dated October 07, 2023. FINDINGS: Interval removal of the right-sided PICC line. Unchanged enteric tube. The heart size and mediastinal contours are within normal limits. The lungs remain hyperinflated. Persistent but improved bilateral lower lobe consolidation. No pleural effusion or pneumothorax. No acute osseous abnormality. IMPRESSION: 1. Persistent but improved bilateral lower lobe pneumonia. Electronically Signed   By: Obie Dredge M.D.   On: 10/11/2023 16:05   DG Chest Port 1 View Result Date: 10/07/2023 CLINICAL DATA:  Acute respiratory failure and hypoxia. EXAM: PORTABLE CHEST 1 VIEW COMPARISON:  Chest radiograph dated 10/04/2023. FINDINGS: Right-sided PICC in  similar position. Interval placement of enteric tube with tip beyond the inferior margin of the image. Background of emphysema. No significant interval change in bilateral lower lung field airspace opacities, right greater than left. No pneumothorax. Stable cardiac silhouette. No acute osseous pathology. IMPRESSION: 1. Interval placement of enteric tube with tip beyond the inferior margin of the image. 2. No significant interval change in bilateral lower lung field airspace opacities, right greater than left. Electronically Signed   By: Elgie Collard M.D.   On: 10/07/2023 09:37   DG Abd 1 View Result Date: 10/06/2023 CLINICAL DATA:  Nasogastric placement EXAM: ABDOMEN - 1 VIEW COMPARISON:  10/05/2023 FINDINGS: Nasogastric tube enters the stomach passes along the greater curvature with its tip in the region of the antrum. Gas pattern unremarkable. Pulmonary infiltrate and volume loss in the lower lungs. IMPRESSION: Nasogastric tube tip in the region of the antrum. Electronically Signed   By: Paulina Fusi M.D.   On: 10/06/2023 18:24   DG Abd 1 View Result Date: 10/05/2023 CLINICAL DATA:  [id number] Encounter for orogastric (OG) tube placement [id number] EXAM: ABDOMEN - 1 VIEW COMPARISON:  09/21/2023 FINDINGS: Limited radiograph of the lower chest and upper abdomen was obtained for the purposes of enteric tube localization. Enteric tube is seen coursing below the diaphragm with distal tip terminating within the expected location of the gastric body. Side port positioned just below the level of the GE junction. IMPRESSION: Enteric tube terminates within the expected location of the gastric body. Side port positioned just below the level of the GE junction. Recommend 4-5 cm of advancement. Electronically Signed   By: Duanne Guess D.O.   On: 10/05/2023 15:12   DG Chest Port 1 View Result Date: 10/04/2023 CLINICAL DATA:  Follow-up of "infiltrate EXAM: PORTABLE CHEST 1 VIEW COMPARISON:  08/30/2024 FINDINGS: Midline  trachea. Normal heart size. Atherosclerosis in the transverse aorta. Small left pleural effusion is similar. No pneumothorax. Right greater than left lower lung airspace disease is felt to be similar. Underlying interstitial prominence and indistinctness. Numerous leads and wires project over the chest. A right-sided PICC line terminates at the superior caval/atrial junction. IMPRESSION: Hyperinflation and interstitial thickening, likely related to COPD/chronic bronchitis. Bilateral lower lobe airspace opacities are similar, suspicious for infection or aspiration. Electronically Signed   By: Jeronimo Greaves M.D.   On: 10/04/2023 18:02   US Venous Img Upper Uni Right(DVT) Result  Date: 10/02/2023 CLINICAL DATA:  Right upper extremity edema.  Evaluate for DVT. EXAM: RIGHT UPPER EXTREMITY VENOUS DOPPLER ULTRASOUND TECHNIQUE: Gray-scale sonography with graded compression, as well as color Doppler and duplex ultrasound were performed to evaluate the upper extremity deep venous system from the level of the subclavian vein and including the jugular, axillary, basilic, radial, ulnar and upper cephalic vein. Spectral Doppler was utilized to evaluate flow at rest and with distal augmentation maneuvers. COMPARISON:  None Available. FINDINGS: Contralateral Subclavian Vein: Respiratory phasicity is normal and symmetric with the symptomatic side. No evidence of thrombus. Normal compressibility. Internal Jugular Vein: No evidence of thrombus. Normal compressibility, respiratory phasicity and response to augmentation. Subclavian Vein: No evidence of thrombus. Normal compressibility, respiratory phasicity and response to augmentation. Axillary Vein: No evidence of thrombus. Normal compressibility, respiratory phasicity and response to augmentation. Cephalic Vein: Not visualized Basilic Vein: Not visualized Brachial Veins: Appears patent where visualized. Radial Veins: Not visualized as patient refused to continue with the  examination. Ulnar Veins: Not visualized as patient refused to continue with the examination. Other Findings:  None visualized. IMPRESSION: No evidence of DVT within the right upper extremity, though patient refused complete examination. Electronically Signed   By: Simonne Come M.D.   On: 10/02/2023 18:50   DG Chest Port 1 View Result Date: 10/01/2023 CLINICAL DATA:  Follow-up infiltrate EXAM: PORTABLE CHEST 1 VIEW COMPARISON:  X-ray 09/24/2023 and older. CT scan chest without contrast 09/25/2023 FINDINGS: Hyperinflation with chronic lung changes. Tiny pleural effusions. Increasing lung base opacities are identified, right worse than left. Acute infiltrates possible. No pneumothorax. Normal cardiopericardial silhouette. Overlapping cardiac leads. Right-sided PICC in place. IMPRESSION: Hyperinflation. Tiny effusions. Increasing lung base opacities. Recommend continued follow-up Electronically Signed   By: Karen Kays M.D.   On: 10/01/2023 14:37    Microbiology: No results found for this or any previous visit (from the past 240 hours).   Labs: CBC: Recent Labs  Lab 10/23/23 0700 10/24/23 0459 10/25/23 0419 10/27/23 0850 10/29/23 0522  WBC 11.0* 11.9* 13.9* 13.9* 14.6*  NEUTROABS  --   --   --  9.4* 9.7*  HGB 8.7* 8.7* 9.1* 8.9* 8.4*  HCT 27.7* 27.6* 28.7* 28.5* 27.6*  MCV 93.6 93.2 92.6 95.6 97.2  PLT 399 411* 426* 401* 352   Basic Metabolic Panel: Recent Labs  Lab 10/22/23 1701 10/23/23 0700 10/24/23 0459 10/25/23 0419 10/27/23 0850 10/29/23 0522  NA  --  137 136 135 138 136  K  --  4.2 4.5 4.4 4.8 5.0  CL  --  98 93* 93* 97* 97*  CO2  --  30 32 31 32 29  GLUCOSE  --  194* 121* 95 120* 120*  BUN  --  17 19 16  39* 37*  CREATININE  --  0.58* 0.62 0.49* 0.55* 0.66  CALCIUM  --  9.3 9.4 9.7 9.5 9.7  MG 2.3 2.0  --   --   --   --   PHOS 3.6 3.2  --   --   --   --    Liver Function Tests: No results for input(s): "AST", "ALT", "ALKPHOS", "BILITOT", "PROT", "ALBUMIN" in the last  168 hours. No results for input(s): "LIPASE", "AMYLASE" in the last 168 hours. No results for input(s): "AMMONIA" in the last 168 hours. Cardiac Enzymes: No results for input(s): "CKTOTAL", "CKMB", "CKMBINDEX", "TROPONINI" in the last 168 hours. BNP (last 3 results) Recent Labs    02/26/23 1459 09/15/23 0833  BNP 110.3* 85.7  CBG: Recent Labs  Lab 10/28/23 0751 10/28/23 1121 10/28/23 1705 10/28/23 2055 10/29/23 0809  GLUCAP 164* 144* 171* 164* 127*    Time spent: 35 minutes  Signed:  Gillis Santa  [facility] 10/29/2023 10:32 AM

## 2023-10-29 NOTE — Progress Notes (Signed)
 Nutrition Follow-up  DOCUMENTATION CODES:   Severe malnutrition in context of acute illness/injury  INTERVENTION:   -Continue dysphagia 2 diet -TF via PEG   Osmolite 1.5 @ 60 ml/hr.    60 ml Prosource TF daily   30 ml free water flush every 4 hours   Tube feeding regimen provides 2240 kcal (100% of needs), 110 grams of protein, and 1097 ml of H2O.  Total free water: 1277 ml daily     -Continue Vitamin C 500mg  BID   -Pt at high refeed risk; recommend monitor potassium, magnesium and phosphorus labs daily until stable -Continue daily weights  -Continue MVI with minerals daily -Continue 100 mg thiamine daily x 7 days  -Continue 1 packet Juven BID via tube, each packet provides 95 calories, 2.5 grams of protein (collagen), and 9.8 grams of carbohydrate (3 grams sugar); also contains 7 grams of L-arginine and L-glutamine, 300 mg vitamin C, 15 mg vitamin E, 1.2 mcg vitamin B-12, 9.5 mg zinc, 200 mg calcium, and 1.5 g  Calcium Beta-hydroxy-Beta-methylbutyrate to support wound healing    NUTRITION DIAGNOSIS:   Severe Malnutrition related to acute illness as evidenced by moderate fat depletion, severe fat depletion, moderate muscle depletion, severe muscle depletion.  Ongoing  GOAL:   Patient will meet greater than or equal to 90% of their needs  Met with TF  MONITOR:   Diet advancement, Labs, Weight trends, TF tolerance, Skin, I & O's  REASON FOR ASSESSMENT:   Consult Enteral/tube feeding initiation and management  ASSESSMENT:   63 y/o male with h/o COPD, DVT, HLD, GERD, pulmonary nodule and DM who is admitted with RSV, pneumonia and COPD exacerbation.  Reviewed I/O's: +600 ml x 24 hours and +4.4 L since 10/15/23  UOP: 100 ml x 24 hours   Per [facility] notes, pt with unstageable sacral pressure injury.   Pt receiving personal care at time of visit.   Per discussion with SLP, no plan for further diet advancement.   Oral intake has improved since last visit. Noted meal  completions 25-100%. Pt is also drinking Ensure supplements. Pt remains on TF for nutritional support: Osmolite 1.5 infusing at goal rate of 60 ml/hr.   Noted mild wt gain over the past week, which is favorable given history of weight loss and malnutrition.   Per TOC notes, plan to discharge to SNF today.   Medications reviewed and include vitamin C, neurontin, miralax, and prednisone.   Labs reviewed: CBGS: 127-225 (inpatient orders for glycemic control are 0-9 units insulin aspart TID before meals and at bedtime).    Diet Order:   Diet Order             Diet general           DIET DYS 2 Room service appropriate? Yes; Fluid consistency: Thin  Diet effective now                   EDUCATION NEEDS:   Education needs have been addressed  Skin:  Skin Assessment: Skin Integrity Issues: Skin Integrity Issues:: Unstageable Stage II: - Unstageable: sacrum Other: IAD to buttocks  Last BM:  10/28/23 (type 1)  Height:   Ht Readings from Last 1 Encounters:  09/10/23 6' (1.829 m)    Weight:   Wt Readings from Last 1 Encounters:  10/29/23 56.8 kg    Ideal Body Weight:  80.9 kg  BMI:  Body mass index is 16.98 kg/m.  Estimated Nutritional Needs:   Kcal:  1900-2200kcal/day  Protein:  95-110g/day  Fluid:  1.8-2.1L/day    Levada Schilling, RD, LDN, CDCES Registered Dietitian III Certified Diabetes Care and Education Specialist If unable to reach this RD, please use "RD Inpatient" group chat on secure chat between hours of 8am-4 pm daily

## 2023-10-29 NOTE — ED Triage Notes (Signed)
 PT BIB [facility] from [facility]  d/t urinary retention.  Pt just d/cd from [facility] today.  He was in a "coma for 2 months" - was seen here for that visit.  Pt is on 3L at baseline.  Pt states he does not want a Foly Catheter.  He has a G-tube for extra nutrition - states he can eat /drink.

## 2023-10-29 NOTE — ED Provider Notes (Signed)
 [facility] Provider Note    Event Date/Time   First MD Initiated Contact with Patient 10/29/23 2104     (approximate)   History   Urinary Retention   HPI  Benjamin Bolton is a 63 y.o. male presents to the emerged to department today with concerns for continued sensation of difficulty with urination.  He states this is been going on for a few days.  Patient was discharged from the hospital earlier today after a prolonged admission secondary to acute on chronic respiratory failure.  He says that they checked his urine while he was here and did not see any signs of infection.  However he continues to have the frequent sensation of needing to urinate.  He feels like when he does he only has a small amount come out.  This has been accompanied by lower abdominal pain.     Physical Exam   Triage Vital Signs: ED Triage Vitals  Encounter Vitals Group     BP 10/29/23 2100 (!) 156/81     Systolic BP Percentile --      Diastolic BP Percentile --      Pulse Rate 10/29/23 2100 (!) 126     Resp 10/29/23 2100 (!) 22     Temp 10/29/23 2123 98.3 F (36.8 C)     Temp Source 10/29/23 2100 Oral     SpO2 10/29/23 2100 100 %     Weight 10/29/23 2122 130 lb (59 kg)     Height 10/29/23 2122 6' (1.829 m)     Head Circumference --      Peak Flow --      Pain Score 10/29/23 2122 10     Pain Loc --      Pain Education --      Exclude from Growth Chart --     Most recent vital signs: Vitals:   10/29/23 2100 10/29/23 2123  BP: (!) 156/81 122/84  Pulse: (!) 126 (!) 120  Resp: (!) 22 (!) 22  Temp:  98.3 F (36.8 C)  SpO2: 100% 100%   General: Awake, alert, oriented. CV:  Good peripheral perfusion. Tachycardia. Resp:  Normal effort. Lungs clear. Abd:  No distention. Non tender.   ED Results / Procedures / Treatments   Labs (all labs ordered are listed, but only abnormal results are displayed) Labs Reviewed  URINALYSIS, ROUTINE W REFLEX MICROSCOPIC - Abnormal;  Notable for the following components:      Result Value   Color, Urine YELLOW (*)    APPearance CLEAR (*)    All other components within normal limits     EKG  None   RADIOLOGY None   PROCEDURES:  Critical Care performed: No    MEDICATIONS ORDERED IN ED: Medications  tamsulosin (FLOMAX) capsule 0.4 mg (0.4 mg Oral Given 10/29/23 2213)     IMPRESSION / MDM / ASSESSMENT AND PLAN / ED COURSE  I reviewed the triage vital signs and the nursing notes.                              Differential diagnosis includes, but is not limited to, UTI, bladder spasms  Patient's presentation is most consistent with acute presentation with potential threat to life or bodily function.  Patient presents to the emergency department today because of concerns for the feeling that he needs to urinate.  Bladder scan does not show significant amount of urine and patient was  able to urinate in the urinal.  UA did not show for signs concerning for urinary tract infection.  At this time I do wonder if patient is suffering from some bladder spasm type pain.  Heart rate was initially elevated although it did come down after the patient's symptoms improved with Flomax.  Will plan on discharging with Flomax.   FINAL CLINICAL IMPRESSION(S) / ED DIAGNOSES   Final diagnoses:  Urinary urgency       Note:  This document was prepared using Dragon voice recognition software and may include unintentional dictation errors.    Phineas Semen, MD 10/29/23 [phone]

## 2023-10-29 NOTE — Plan of Care (Signed)

## 2023-10-29 NOTE — TOC Progression Note (Signed)
 Transition of Care [facility]) - Progression Note    Patient Details  Name: Benjamin Bolton MRN: [id number] Date of Birth: 10/15/1960  Transition of Care [facility]) CM/SW Contact  Marlowe Sax, RN Phone Number: 10/29/2023, 11:47 AM  Clinical Narrative:     Patient going top [employer] after 2 PM< Sister called and made aware he will go to room 503, EMS called to arrange transdport  Expected Discharge Plan: Long Term Acute Care (LTAC)    Expected Discharge Plan and Services         Expected Discharge Date: 10/29/23                                     Social Determinants of Health (SDOH) Interventions SDOH Screenings   Food Insecurity: No Food Insecurity (09/12/2023)  Recent Concern: Food Insecurity - Food Insecurity Present (08/25/2023)   Received from [facility]  Housing: Unknown (09/12/2023)  Transportation Needs: No Transportation Needs (09/12/2023)  Recent Concern: Transportation Needs - Unmet Transportation Needs (08/25/2023)   Received from [facility]  Utilities: Not At Risk (09/12/2023)  Recent Concern: Utilities - At Risk (07/23/2023)   Received from [facility]  Financial Resource Strain: Low Risk  (08/25/2023)   Received from [facility]  Recent Concern: Financial Resource Strain - Medium Risk (07/23/2023)   Received from [facility]  Physical Activity: Insufficiently Active (08/25/2023)   Received from [facility]  Social Connections: Socially Isolated (08/25/2023)   Received from [facility]  Stress: No Stress Concern Present (08/25/2023)   Received from [facility]  Tobacco Use: High Risk (09/25/2023)  Health Literacy: Inadequate Health Literacy (08/25/2023)   Received from [facility]    Readmission Risk Interventions     No data to display

## 2023-10-29 NOTE — Progress Notes (Signed)
 Occupational Therapy Treatment Patient Details Name: Jody Silas MRN: [id number] DOB: February 27, 1961 Today's Date: 10/29/2023   History of present illness 63 y.o. 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   OT comments  Chart reviewed to date, pt greeted in bed stating "you weren't here earlier" and requiring encouragement for participation in OT tx session targeting improving functional activity tolerance in preparation for ADL tasks. Pt appears self limiting on this date however progress made with mobility, performs 3 STS with CGA with RW, frequent vcs for technique for bed change, amb in room 48ft with RW 2 attempts with MIN-MOD A, frequent vcs. NT in room to assist with linen change. Toileting completed with supervision via urinal, grooming with SET UP. Goals have been updated to reflect progress, discharge remains appropriate. OT will continue to follow.       If plan is discharge home, recommend the following:  Help with stairs or ramp for entrance;Assistance with cooking/housework;Assist for transportation;Assistance with feeding;A little help with walking and/or transfers;A lot of help with bathing/dressing/bathroom   Equipment Recommendations  BSC/3in1;Wheelchair (measurements OT);Tub/shower seat    Recommendations for Other Services      Precautions / Restrictions Precautions Precautions: Fall Recall of Precautions/Restrictions: Intact Restrictions Weight Bearing Restrictions Per Provider Order: No       Mobility Bed Mobility Overal bed mobility: Needs Assistance Bed Mobility: Supine to Sit, Sit to Supine     Supine to sit: Supervision, HOB elevated Sit to supine: Supervision, HOB elevated        Transfers Overall transfer level: Needs  assistance Equipment used: Rolling walker (2 wheels) Transfers: Sit to/from Stand Sit to Stand: Contact guard assist (3 attempts, frequent vcs for RW technique)                 Balance Overall balance assessment: Needs assistance Sitting-balance support: Feet supported Sitting balance-Leahy Scale: Good     Standing balance support: Bilateral upper extremity supported Standing balance-Leahy Scale: Fair                             ADL either performed or assessed with clinical judgement   ADL Overall ADL's : Needs assistance/impaired     Grooming: Wash/dry face;Sitting;Set up           Upper Body Dressing : Minimal assistance;Sitting Upper Body Dressing Details (indicate cue type and reason): donn/doff gown Lower Body Dressing: Maximal assistance   Toilet Transfer: Minimal assistance;Moderate assistance;Rolling walker (2 wheels);Ambulation Toilet Transfer Details (indicate cue type and reason): simulated Toileting- Clothing Manipulation and Hygiene: supervision sitting with urinal at edge of bed;Sit to/from stand       Functional mobility during ADLs: Minimal assistance;Moderate assistance;Rolling walker (2 wheels) (approx 4' forward/back 2 attempts; pt appears self limiting)      Extremity/Trunk Assessment Upper Extremity Assessment Upper Extremity Assessment: Generalized weakness RUE Deficits / Details: MMT grossly 3/5 throughout LUE Deficits / Details: MMT grossly 3/5 throughout   Lower Extremity Assessment Lower Extremity Assessment: Generalized weakness        Vision       Perception     Praxis     Communication Communication Communication: No apparent difficulties   Cognition Arousal: Alert Behavior During Therapy: WFL for tasks assessed/performed Cognition: No family/caregiver present  to determine baseline, Cognition impaired     Awareness: Intellectual awareness impaired, Online awareness impaired   Attention impairment  (select first level of impairment): Selective attention Executive functioning impairment (select all impairments): Problem solving, Reasoning OT - Cognition Comments: requires encouragement for participation in tasks                 Following commands: Impaired Following commands impaired: Follows one step commands with increased time      Cueing   Cueing Techniques: Verbal cues, Tactile cues  Exercises Other Exercises Other Exercises: edu re: role of OT, role of rehab, importance of continued participation/progress    Shoulder Instructions       General Comments monitored spo2 and HR throughout with biofeedback provided to pt with spo2 >88% on 3 L via [place] throughout, HR up to 130 bpm during amb, quickly recovers to 100s bpm seated on edge of bed    Pertinent Vitals/ Pain       Pain Assessment Pain Assessment: Faces Faces Pain Scale: Hurts little more Pain Location: generalized Pain Descriptors / Indicators: Discomfort Pain Intervention(s): Monitored during session, Premedicated before session, Limited activity within patient's tolerance, Repositioned  Home Living                                          Prior Functioning/Environment              Frequency  Min 1X/week        Progress Toward Goals  OT Goals(current goals can now be found in the care plan section)  Progress towards OT goals: Progressing toward goals  Acute Rehab OT Goals Patient Stated Goal: get lungs stronger OT Goal Formulation: With patient Time For Goal Achievement: 11/12/23 Potential to Achieve Goals: Good  Plan      Co-evaluation                 AM-PAC OT "6 Clicks" Daily Activity     Outcome Measure   Help from another person eating meals?: A Little Help from another person taking care of personal grooming?: A Little Help from another person toileting, which includes using toliet, bedpan, or urinal?: A Lot Help from another person bathing (including  washing, rinsing, drying)?: A Lot Help from another person to put on and taking off regular upper body clothing?: A Little Help from another person to put on and taking off regular lower body clothing?: A Lot 6 Click Score: 15    End of Session Equipment Utilized During Treatment: Oxygen;Rolling walker (2 wheels)  OT Visit Diagnosis: Other abnormalities of gait and mobility (R26.89);Muscle weakness (generalized) (M62.81)   Activity Tolerance Patient tolerated treatment well   Patient Left with call bell/phone within reach;in chair;with chair alarm set   Nurse Communication Mobility status        Time: 8469-6295 OT Time Calculation (min): 26 min  Charges: OT General Charges $OT Visit: 1 Visit OT Evaluation $OT Re-eval: 1 Re-eval OT Treatments $Self Care/Home Management : 8-22 mins $Therapeutic Activity: 8-22 mins  Oleta Mouse, OTD OTR/L  10/29/23, 10:44 AM

## 2023-10-29 NOTE — Plan of Care (Signed)
                                                     Palliative Care Progress Note   Patient Name: Benjamin Bolton       Date: 10/29/2023 DOB: [birth year]  Age: 63 y.o. MRN#: [id number] Attending Physician: Gillis Santa, MD Primary Care Physician: [facility] Admit Date: 09/10/2023  Extensive chart review completed including labs, vital signs, imaging, progress notes, orders, and available advanced directive documents from current and previous encounters.   Discharge summary is in place.  Plan is for patient to discharge to [facility] today.  I have previously discussed importance of advance care planning with patient.  Copy of MOST form remains at bedside.  However, given patient's recent change in his plan of care from DNR with limited interventions to full code and full scope, a MOST form is not necessary at this time.  I have discussed with patient that it can be completed with any MD/PA/NP/DO should he wish to do so.   No acute palliative needs at this time.  PMT remains available to patient throughout his hospitalization.  Please reengage with PMT if discharge planning changes and acute palliative needs arise.  Thank you for allowing the Palliative Medicine Team to assist in the care of Benjamin Bolton.  Samara Deist L. Bonita Quin, DNP, FNP-BC Palliative Medicine Team  No charge

## 2023-10-29 NOTE — Discharge Instructions (Signed)
 Please follow up with your physician. If the urinary symptoms persist you can follow up with urology.

## 2023-11-01 ENCOUNTER — Other Ambulatory Visit: Payer: Self-pay

## 2023-11-01 ENCOUNTER — Emergency Department

## 2023-11-01 ENCOUNTER — Emergency Department
Admission: EM | Admit: 2023-11-01 | Discharge: 2023-11-02 | Disposition: A | Discharge status: Home / Self Care | Attending: Emergency Medicine | Admitting: Emergency Medicine

## 2023-11-01 DIAGNOSIS — D72829 Elevated white blood cell count, unspecified: Secondary | ICD-10-CM | POA: Diagnosis not present

## 2023-11-01 DIAGNOSIS — B974 Respiratory syncytial virus as the cause of diseases classified elsewhere: Secondary | ICD-10-CM | POA: Diagnosis not present

## 2023-11-01 DIAGNOSIS — J441 Chronic obstructive pulmonary disease with (acute) exacerbation: Secondary | ICD-10-CM | POA: Diagnosis not present

## 2023-11-01 DIAGNOSIS — B338 Other specified viral diseases: Secondary | ICD-10-CM | POA: Diagnosis not present

## 2023-11-01 DIAGNOSIS — R0602 Shortness of breath: Secondary | ICD-10-CM | POA: Diagnosis present

## 2023-11-01 LAB — CBC
HCT: 32.5 % — ABNORMAL LOW (ref 39.0–52.0)
Hemoglobin: 10.1 g/dL — ABNORMAL LOW (ref 13.0–17.0)
MCH: 29.6 pg (ref 26.0–34.0)
MCHC: 31.1 g/dL (ref 30.0–36.0)
MCV: 95.3 fL (ref 80.0–100.0)
Platelets: 337 10*3/uL (ref 150–400)
RBC: 3.41 MIL/uL — ABNORMAL LOW (ref 4.22–5.81)
RDW: 17.7 % — ABNORMAL HIGH (ref 11.5–15.5)
WBC: 16.1 10*3/uL — ABNORMAL HIGH (ref 4.0–10.5)
nRBC: 1.4 % — ABNORMAL HIGH (ref 0.0–0.2)

## 2023-11-01 LAB — BASIC METABOLIC PANEL
Anion gap: 9 (ref 5–15)
BUN: 20 mg/dL (ref 8–23)
CO2: 29 mmol/L (ref 22–32)
Calcium: 9.4 mg/dL (ref 8.9–10.3)
Chloride: 100 mmol/L (ref 98–111)
Creatinine, Ser: 0.53 mg/dL — ABNORMAL LOW (ref 0.61–1.24)
GFR, Estimated: >60 mL/min (ref >60)
Glucose, Bld: 96 mg/dL (ref 70–99)
Potassium: 4.2 mmol/L (ref 3.5–5.1)
Sodium: 138 mmol/L (ref 135–145)

## 2023-11-01 LAB — RESP PANEL BY RT-PCR (RSV, FLU A&B, COVID)  RVPGX2
Influenza A by PCR: NEGATIVE
Influenza B by PCR: NEGATIVE
Resp Syncytial Virus by PCR: POSITIVE — AB
SARS Coronavirus 2 by RT PCR: NEGATIVE

## 2023-11-01 LAB — TROPONIN I (HIGH SENSITIVITY): Troponin I (High Sensitivity): 12 ng/L (ref <18)

## 2023-11-01 MED ORDER — ALBUTEROL SULFATE HFA 108 (90 BASE) MCG/ACT IN AERS
2.0000 | INHALATION_SPRAY | RESPIRATORY_TRACT | Status: DC | PRN
  Administered 2023-11-02: 2 via RESPIRATORY_TRACT
  Filled 2023-11-01: qty 6.7

## 2023-11-01 MED ORDER — METHYLPREDNISOLONE SODIUM SUCC 125 MG IJ SOLR
125.0000 mg | Freq: Once | INTRAMUSCULAR | Status: AC
  Administered 2023-11-01: 125 mg via INTRAVENOUS
  Filled 2023-11-01: qty 2

## 2023-11-01 MED ORDER — SODIUM CHLORIDE 0.9 % IV BOLUS
500.0000 mL | Freq: Once | INTRAVENOUS | Status: AC
  Administered 2023-11-01: 500 mL via INTRAVENOUS

## 2023-11-01 MED ORDER — PREDNISONE 20 MG PO TABS: 40.0000 mg | ORAL_TABLET | Freq: Every day | ORAL | 0 refills | Status: AC

## 2023-11-01 MED ORDER — ACETAMINOPHEN 500 MG PO TABS
1000.0000 mg | ORAL_TABLET | Freq: Once | ORAL | Status: AC
  Administered 2023-11-01: 1000 mg via ORAL
  Filled 2023-11-01: qty 2

## 2023-11-01 NOTE — Discharge Instructions (Addendum)
 Please take your steroids as prescribed for the next 5 days.  Return to the emergency department for any worsening shortness of breath, chest pain or any other symptom personally concerning to yourself.

## 2023-11-01 NOTE — ED Provider Notes (Signed)
 [facility] Provider Note    Event Date/Time   First MD Initiated Contact with Patient 11/01/23 2155     (approximate)  History   Chief Complaint: Shortness of Breath  HPI  Benjamin Bolton is a 63 y.o. male with a past medical history of COPD on 2 L chronically, hyperlipidemia, presents to the emergency department for shortness of breath.  According to the patient the last several days he has had continued and worsening shortness of breath.  Patient currently satting in the mid 90s on 2 L which is his baseline O2 requirement.  Patient states occasional cough.  Patient states he believes he just needs a steroid injection which has helped him in the past for his COPD.  Physical Exam   Triage Vital Signs: ED Triage Vitals  Encounter Vitals Group     BP 11/01/23 2031 107/68     Systolic BP Percentile --      Diastolic BP Percentile --      Pulse Rate 11/01/23 2031 (!) 123     Resp 11/01/23 2031 19     Temp 11/01/23 2031 98.1 F (36.7 C)     Temp Source 11/01/23 2031 Oral     SpO2 11/01/23 2031 94 %     Weight 11/01/23 2022 130 lb 1.1 oz (59 kg)     Height 11/01/23 2022 6' (1.829 m)     Head Circumference --      Peak Flow --      Pain Score 11/01/23 2022 0     Pain Loc --      Pain Education --      Exclude from Growth Chart --     Most recent vital signs: Vitals:   11/01/23 2200 11/01/23 2203  BP: 107/73   Pulse: (!) 118 (!) 106  Resp: (!) 22   Temp:    SpO2: 94% 99%    General: Awake, no distress.  Frail-appearing. CV:  Good peripheral perfusion.  Regular rate and rhythm  Resp:  Normal effort.  Equal breath sounds bilaterally.  Abd:  No distention.  Soft, nontender.  No rebound or guarding.  ED Results / Procedures / Treatments   EKG  EKG viewed and interpreted by myself shows sinus tachycardia 118 bpm with a narrow QRS, normal axis, normal intervals, nonspecific ST changes.  Some electrical interference.  RADIOLOGY  I have  reviewed and interpreted chest x-ray images.  Lungs consistent with COPD hyperinflation.  No obvious consolidation on my evaluation. Radiology is read the x-ray is consistent with emphysema however there continues to be airspace disease right greater than left in the lung bases without significant change from prior CT in August.  Could represent infectious versus inflammatory changes or underlying neoplasm, we will proceed with a CT scan to further evaluate.   MEDICATIONS ORDERED IN ED: Medications  albuterol (VENTOLIN HFA) 108 (90 Base) MCG/ACT inhaler 2 puff (has no administration in time range)  methylPREDNISolone sodium succinate (SOLU-MEDROL) 125 mg/2 mL injection 125 mg (has no administration in time range)     IMPRESSION / MDM / ASSESSMENT AND PLAN / ED COURSE  I reviewed the triage vital signs and the nursing notes.  Patient's presentation is most consistent with acute presentation with potential threat to life or bodily function.  Patient presents to the emergency department for worsening shortness of breath over the last several days.  Patient has COPD wears 2 L of oxygen at baseline.  Patient's workup so far  shows RSV positive on his respiratory panel very likely explaining the patient's worsening shortness of breath.  Reassuringly patient satting in the mid 90s on 2 L which is his baseline O2 requirement.  Patient's chest x-ray does show opacities that have been present since August in the lung bases.  We will obtain a CT scan to further evaluate to help rule out neoplasm.  We will dose 125 mg of IV Solu-Medrol we will check labs including a troponin and continue to closely monitor while awaiting results.  Patient's lab work has resulted showing mild leukocytosis 16,000 otherwise reassuring CBC, reassuring chemistry.  Patient's respiratory panel again is RSV positive likely explaining the symptoms.  Chest x-ray showed opacity, consolidation of her scarring versus mass.  CT scan shows  what appears to be resolving consolidation likely thought to be scar tissue/postinflammatory.  Troponin pending.  As long as the patient's troponin is negative anticipate discharge home.  He is feeling much better.  FINAL CLINICAL IMPRESSION(S) / ED DIAGNOSES   Dyspnea RSV   Note:  This document was prepared using Dragon voice recognition software and may include unintentional dictation errors.   Minna Antis, MD 11/01/23 [phone]

## 2023-11-01 NOTE — ED Notes (Signed)
 Patient transported to CT

## 2023-11-01 NOTE — ED Notes (Signed)
 Pt reported of feeding tube being uncomfortable and RN was notified

## 2023-11-01 NOTE — ED Triage Notes (Signed)
 Pt arrived from [facility] BIB [facility] for SOB x2 days, pt has received 2 albuterol NEB treatment and inhaler PTA w/o any relief. EMS reports lungs clear throughout. Afebrile. Pt has COPD, wears 2L [place] at baseline, pt reports he is wanting a steroid injection to improve breathing. RR even and unlabored, NAD noted, pt alert, A&O x4.   EMS vitals 94% 2L 119 HR 22 RR 117/73

## 2023-11-01 NOTE — ED Provider Notes (Signed)
-----------------------------------------   11:01 PM on 11/01/2023 -----------------------------------------  Assuming care from Dr. Lenard Lance.  In short, Benjamin Bolton is a 63 y.o. male with a chief complaint of SOB.  Refer to the original H&P for additional details.  The current plan of care is to follow up on troponin.  Anticipate discharge if troponin is normal.   Clinical Course as of 11/02/23 0003  Sun Nov 02, 2023  0002 Troponin I (High Sensitivity): 12 The patient's troponin is within normal limits.  I updated the patient and he is breathing comfortably and calmly.  I updated him about the plan for discharge and he agrees and will follow-up as an outpatient.  I gave my usual and customary return precautions. [CF]    Clinical Course User Index [CF] Loleta Rose, MD     Medications  albuterol (VENTOLIN HFA) 108 (90 Base) MCG/ACT inhaler 2 puff (has no administration in time range)  methylPREDNISolone sodium succinate (SOLU-MEDROL) 125 mg/2 mL injection 125 mg (125 mg Intravenous Given 11/01/23 2216)  acetaminophen (TYLENOL) tablet 1,000 mg (1,000 mg Oral Given 11/01/23 2311)  sodium chloride 0.9 % bolus 500 mL (500 mLs Intravenous Bolus from Bag 11/01/23 2316)     ED Discharge Orders          Ordered    predniSONE (DELTASONE) 20 MG tablet  Daily with breakfast        11/01/23 2307           Final diagnoses:  RSV infection  COPD exacerbation (HCC)     Loleta Rose, MD 11/02/23 0003

## 2023-11-02 NOTE — ED Notes (Signed)
 Called [facility] spoke to rep Gerlene Burdock, he put the pt on the list to be picked up.

## 2023-11-02 NOTE — ED Notes (Signed)
 Pt given urinal and assisted to side of bed for use.

## 2023-11-02 NOTE — ED Notes (Signed)
 Called for a follow up spoke with rep.[id number] rep stated pt is next on the list

## 2023-11-15 ENCOUNTER — Emergency Department
Admission: EM | Admit: 2023-11-15 | Discharge: 2023-11-15 | Disposition: A | Discharge status: Home / Self Care | Attending: Emergency Medicine | Admitting: Emergency Medicine

## 2023-11-15 ENCOUNTER — Encounter: Payer: Self-pay | Admitting: Emergency Medicine

## 2023-11-15 ENCOUNTER — Emergency Department

## 2023-11-15 ENCOUNTER — Other Ambulatory Visit: Payer: Self-pay

## 2023-11-15 DIAGNOSIS — J441 Chronic obstructive pulmonary disease with (acute) exacerbation: Secondary | ICD-10-CM | POA: Diagnosis not present

## 2023-11-15 DIAGNOSIS — R0602 Shortness of breath: Secondary | ICD-10-CM | POA: Diagnosis present

## 2023-11-15 LAB — RESP PANEL BY RT-PCR (RSV, FLU A&B, COVID)  RVPGX2
Influenza A by PCR: NEGATIVE
Influenza B by PCR: NEGATIVE
Resp Syncytial Virus by PCR: NEGATIVE
SARS Coronavirus 2 by RT PCR: NEGATIVE

## 2023-11-15 LAB — BASIC METABOLIC PANEL
Anion gap: 6 (ref 5–15)
BUN: 16 mg/dL (ref 8–23)
CO2: 29 mmol/L (ref 22–32)
Calcium: 9 mg/dL (ref 8.9–10.3)
Chloride: 105 mmol/L (ref 98–111)
Creatinine, Ser: 0.44 mg/dL — ABNORMAL LOW (ref 0.61–1.24)
GFR, Estimated: >60 mL/min (ref >60)
Glucose, Bld: 183 mg/dL — ABNORMAL HIGH (ref 70–99)
Potassium: 3.4 mmol/L — ABNORMAL LOW (ref 3.5–5.1)
Sodium: 140 mmol/L (ref 135–145)

## 2023-11-15 LAB — CBC WITH DIFFERENTIAL/PLATELET
Abs Immature Granulocytes: 0.07 10*3/uL (ref 0.00–0.07)
Basophils Absolute: 0 10*3/uL (ref 0.0–0.1)
Basophils Relative: 0 %
Eosinophils Absolute: 0 10*3/uL (ref 0.0–0.5)
Eosinophils Relative: 0 %
HCT: 31.6 % — ABNORMAL LOW (ref 39.0–52.0)
Hemoglobin: 9.8 g/dL — ABNORMAL LOW (ref 13.0–17.0)
Immature Granulocytes: 1 %
Lymphocytes Relative: 30 %
Lymphs Abs: 2.7 10*3/uL (ref 0.7–4.0)
MCH: 30.4 pg (ref 26.0–34.0)
MCHC: 31 g/dL (ref 30.0–36.0)
MCV: 98.1 fL (ref 80.0–100.0)
Monocytes Absolute: 0.9 10*3/uL (ref 0.1–1.0)
Monocytes Relative: 10 %
Neutro Abs: 5.3 10*3/uL (ref 1.7–7.7)
Neutrophils Relative %: 59 %
Platelets: 232 10*3/uL (ref 150–400)
RBC: 3.22 MIL/uL — ABNORMAL LOW (ref 4.22–5.81)
RDW: 17.2 % — ABNORMAL HIGH (ref 11.5–15.5)
WBC: 8.9 10*3/uL (ref 4.0–10.5)
nRBC: 0 % (ref 0.0–0.2)

## 2023-11-15 LAB — TROPONIN I (HIGH SENSITIVITY): Troponin I (High Sensitivity): 10 ng/L (ref <18)

## 2023-11-15 MED ORDER — METHYLPREDNISOLONE SODIUM SUCC 125 MG IJ SOLR
125.0000 mg | Freq: Once | INTRAMUSCULAR | Status: AC
  Administered 2023-11-15: 125 mg via INTRAVENOUS
  Filled 2023-11-15: qty 2

## 2023-11-15 MED ORDER — IPRATROPIUM-ALBUTEROL 0.5-2.5 (3) MG/3ML IN SOLN
3.0000 mL | Freq: Once | RESPIRATORY_TRACT | Status: AC
  Administered 2023-11-15: 3 mL via RESPIRATORY_TRACT
  Filled 2023-11-15: qty 3

## 2023-11-15 MED ORDER — OXYCODONE-ACETAMINOPHEN 5-325 MG PO TABS
1.0000 | ORAL_TABLET | Freq: Once | ORAL | Status: AC
  Administered 2023-11-15: 1 via ORAL
  Filled 2023-11-15: qty 1

## 2023-11-15 NOTE — ED Triage Notes (Signed)
 BIB ems from [facility], for [facility] Dx with pnumonia about a week ago, today was supposed to be last day of abx, pt states "albuterol treatments aren't helping just giving headache."  Was given two breathing treatments from facility since midnight.  Pt always on 3LNC, pt a&ox4

## 2023-11-15 NOTE — ED Notes (Signed)
 Patient calling out again asking about transport. Patient informed transport has been set up, but he is just waiting for them to arrive at this time. Patient concerned about missing breakfast and morning meds. Unable to provide list of medications at this time.   Patient provided with [place] sandwich box and ginger ale at this time.

## 2023-11-15 NOTE — ED Notes (Signed)
 Patient moved to hallway while waiting on his ride. Patient concerned about wait times for ambulance stating that it's been "4 hours" that he has been waiting.

## 2023-11-15 NOTE — ED Provider Notes (Signed)
 [facility] Provider Note    Event Date/Time   First MD Initiated Contact with Patient 11/15/23 [phone]     (approximate)   History   Shortness of Breath   HPI  Benjamin Bolton is a 63 y.o. male with history of COPD on 3 L nasal cannula chronically who presents with complaints of shortness of breath.  Patient reports this developed after having his hair washed.  1 week ago per review of records he was diagnosed with RSV/pneumonia and is almost done with antibiotics.  He reports his breathing feels better at this time and he seems to be at his baseline.     Physical Exam   Triage Vital Signs: ED Triage Vitals  Encounter Vitals Group     BP 11/15/23 0635 117/83     Systolic BP Percentile --      Diastolic BP Percentile --      Pulse Rate 11/15/23 0635 94     Resp 11/15/23 0635 (!) 21     Temp 11/15/23 0635 97.6 F (36.4 C)     Temp Source 11/15/23 0635 Oral     SpO2 11/15/23 0635 99 %     Weight 11/15/23 0633 61.2 kg (135 lb)     Height 11/15/23 0633 1.829 m (6')     Head Circumference --      Peak Flow --      Pain Score --      Pain Loc --      Pain Education --      Exclude from Growth Chart --     Most recent vital signs: Vitals:   11/15/23 0700 11/15/23 0730  BP: 112/76 112/77  Pulse: 87 91  Resp: 18 18  Temp:    SpO2:       General: Awake, no distress.  CV:  Good peripheral perfusion.  Resp:  Normal effort.  No tachypnea on my exam, clear to auscultation Abd:  No distention.  Other:     ED Results / Procedures / Treatments   Labs (all labs ordered are listed, but only abnormal results are displayed) Labs Reviewed  BASIC METABOLIC PANEL - Abnormal; Notable for the following components:      Result Value   Potassium 3.4 (*)    Glucose, Bld 183 (*)    Creatinine, Ser 0.44 (*)    All other components within normal limits  CBC WITH DIFFERENTIAL/PLATELET - Abnormal; Notable for the following components:   RBC 3.22 (*)     Hemoglobin 9.8 (*)    HCT 31.6 (*)    RDW 17.2 (*)    All other components within normal limits  RESP PANEL BY RT-PCR (RSV, FLU A&B, COVID)  RVPGX2  TROPONIN I (HIGH SENSITIVITY)     EKG  ED ECG REPORT I, Jene Every, the attending physician, personally viewed and interpreted this ECG.  Date: 11/15/2023  Rhythm: normal sinus rhythm QRS Axis: normal Intervals: normal ST/T Wave abnormalities: normal Narrative Interpretation: no evidence of acute ischemia    RADIOLOGY Chest x-ray viewed to read by me, suspect resolving pneumonia, pending radiology review    PROCEDURES:  Critical Care performed:   Procedures   MEDICATIONS ORDERED IN ED: Medications  ipratropium-albuterol (DUONEB) 0.5-2.5 (3) MG/3ML nebulizer solution 3 mL (3 mLs Nebulization Given 11/15/23 0647)  methylPREDNISolone sodium succinate (SOLU-MEDROL) 125 mg/2 mL injection 125 mg (125 mg Intravenous Given 11/15/23 0646)     IMPRESSION / MDM / ASSESSMENT AND PLAN / ED  COURSE  I reviewed the triage vital signs and the nursing notes. Patient's presentation is most consistent with severe exacerbation of chronic illness.  Patient presents with shortness of breath in the setting of COPD/recent RSV pneumonia  He felt short of breath earlier but seems to be improved now, on my exam patient is comfortable and well-appearing overall.  Lab work is generally reassuring, pending x-ray results, received Solu-Medrol and DuoNeb here in the emergency department, will reevaluate  ----------------------------------------- 8:55 AM on 11/15/2023 ----------------------------------------- Patient feeling better after treatment, oxygen is 100% he was able to ambulate without any shortness of breath.  Given his prediabetes we will hold off on p.o. prednisone, close follow-up with PCP, return precautions, he agrees with this plan      FINAL CLINICAL IMPRESSION(S) / ED DIAGNOSES   Final diagnoses:  Shortness of breath   COPD exacerbation (HCC)     Rx / DC Orders   ED Discharge Orders     None        Note:  This document was prepared using Dragon voice recognition software and may include unintentional dictation errors.   Jene Every, MD 11/15/23 [phone]

## 2023-11-15 NOTE — ED Notes (Signed)
 Patient discharged from ED by provider. Discharge instructions reviewed with patient and all questions answered. Patient carried via Health visitor in NAD.   Passenger transport manager notified of patient's return.

## 2023-11-21 IMAGING — MR MR CERVICAL SPINE W/O CM
5 series · 35 of 48 positions shown · non-contrast
Comparison: None available.

CLINICAL DATA: Initial evaluation for neck pain extending into the
right upper extremity with right hand tingling.

EXAM:
MRI CERVICAL SPINE WITHOUT CONTRAST
TECHNIQUE: Multiplanar, multisequence MR imaging of the cervical spine was
performed. No intravenous contrast was administered.

[Series 5: T2 · sagittal · 3.0mm · 0.62mm/px · 6 of 15 slices shown (1 of 2)]
[im 1/15]
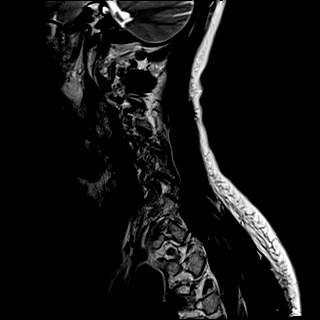
[im 3/15]
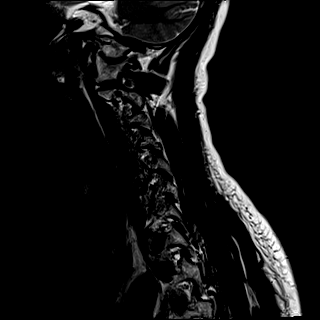
[im 6/15]
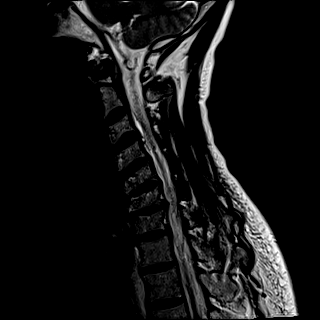
[im 9/15]
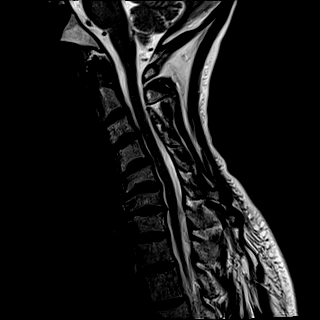
[im 12/15]
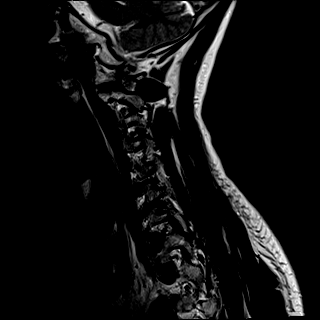
[im 15/15]
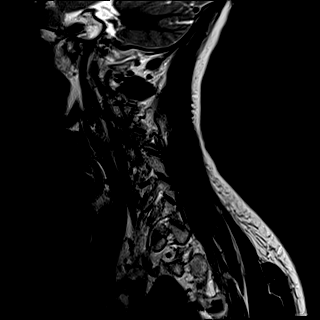

[Series 6: FLAIR · sagittal · 3.0mm · 0.78mm/px · 7 of 15 slices shown]
[im 1/15]
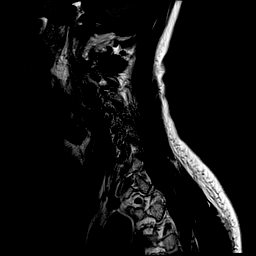
[im 3/15]
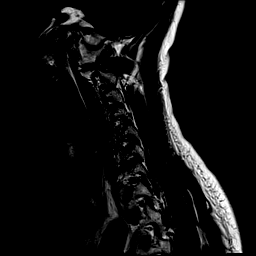
[im 5/15]
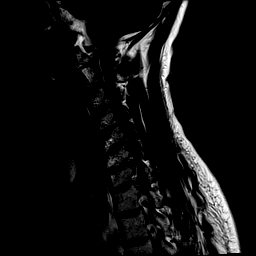
[im 8/15]
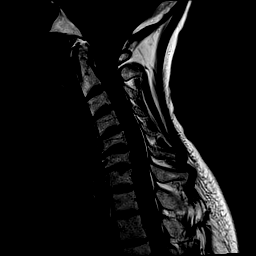
[im 10/15]
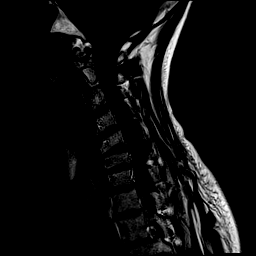
[im 12/15]
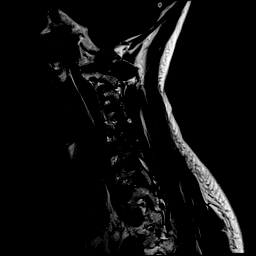
[im 15/15]
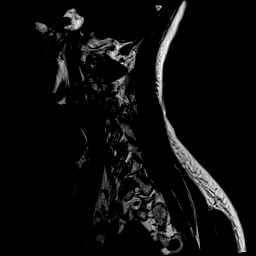

[Series 7: STIR · sagittal · 3.0mm · 0.62mm/px · 7 of 15 slices shown]
[im 1/15]
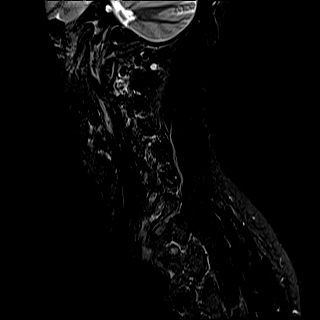
[im 3/15]
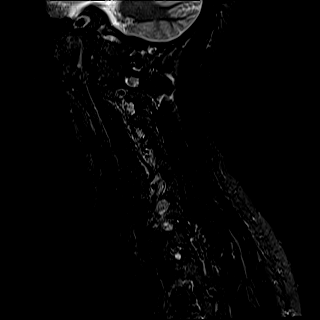
[im 5/15]
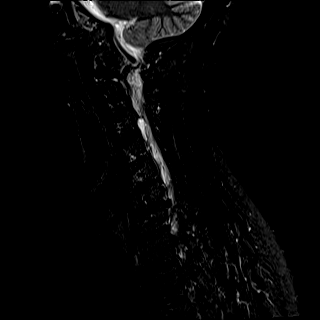
[im 8/15]
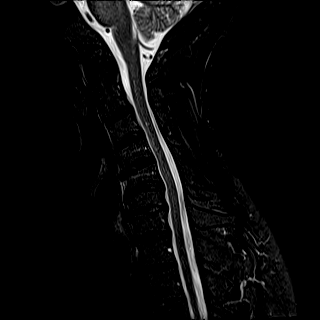
[im 10/15]
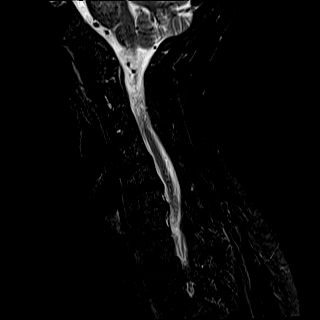
[im 12/15]
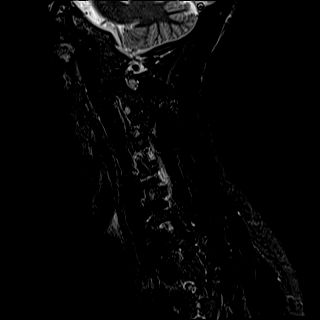
[im 15/15]
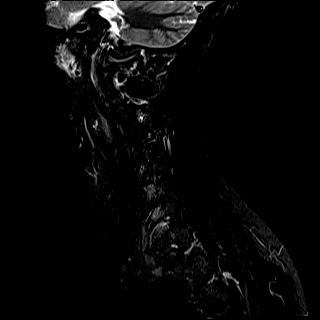

[Series 8: T2 · axial · 3.0mm · 0.70mm/px · z∈[-73,+18]mm · 8 of 29 slices shown (2 of 2)]
[im 1/29]
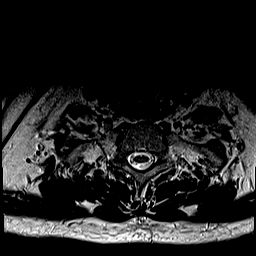
[im 5/29]
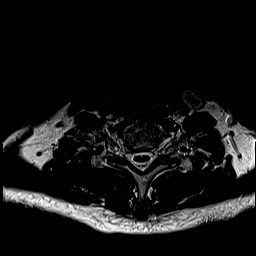
[im 9/29]
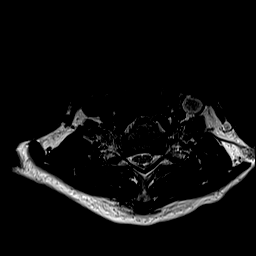
[im 13/29]
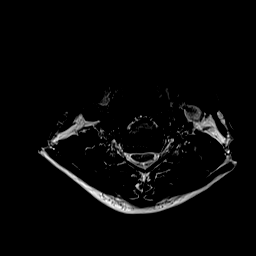
[im 16/29]
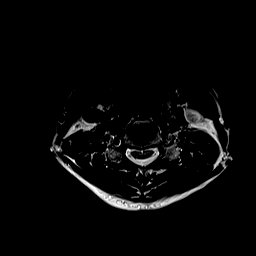
[im 20/29]
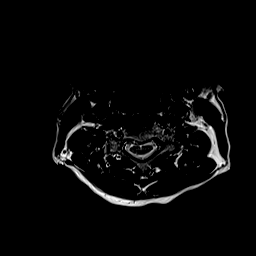
[im 24/29]
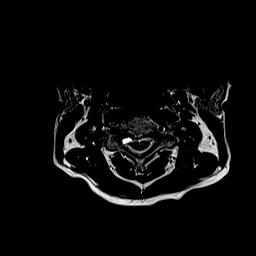
[im 29/29]
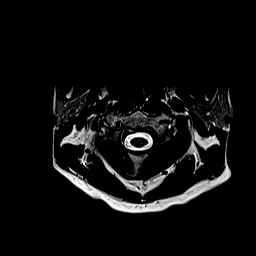

[Series 9: ax mpgr · axial · 3.0mm · 0.35mm/px · z∈[-73,+2]mm · 7 of 29 slices shown]
[im 1/29]
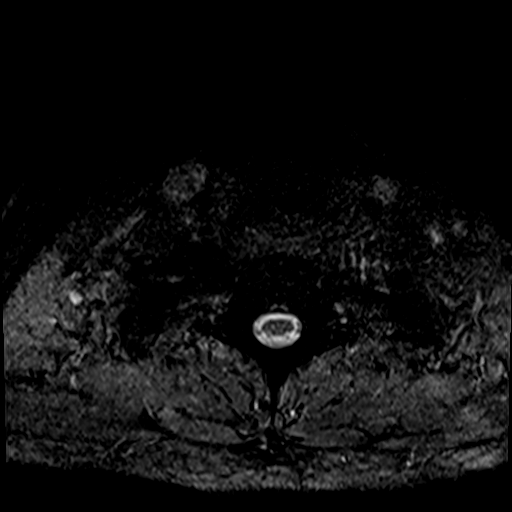
[im 5/29]
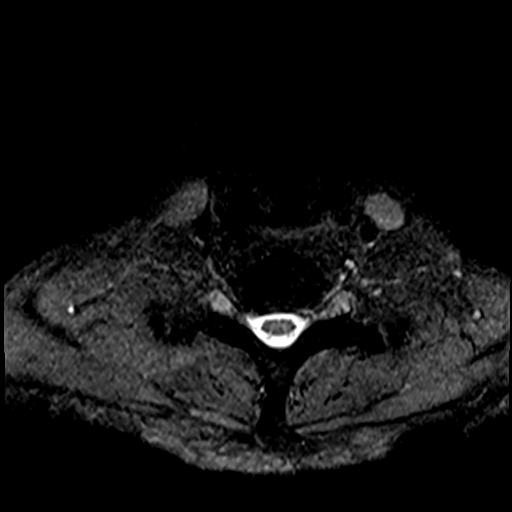
[im 9/29]
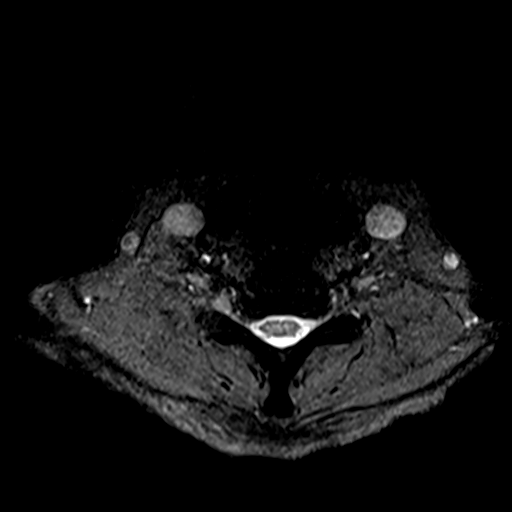
[im 13/29]
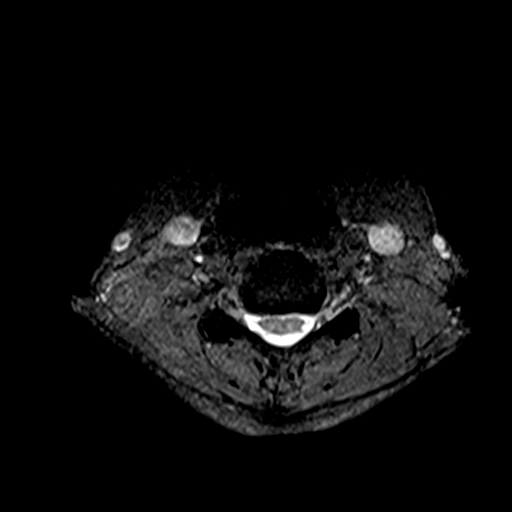
[im 16/29]
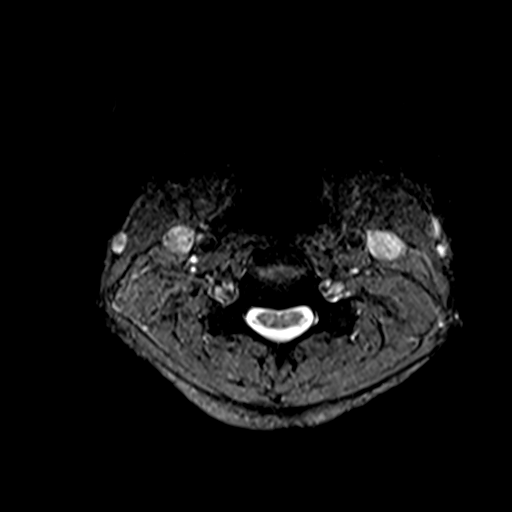
[im 20/29]
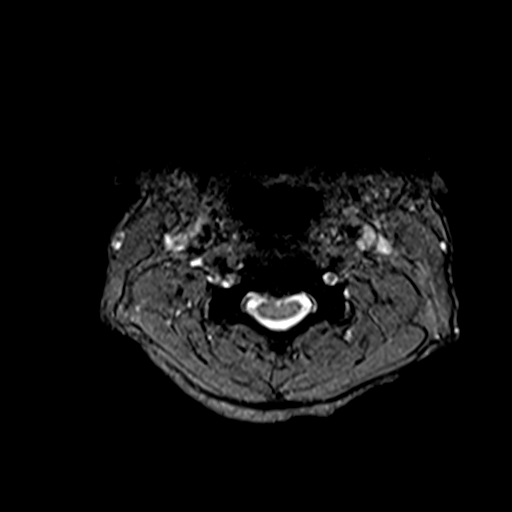
[im 24/29]
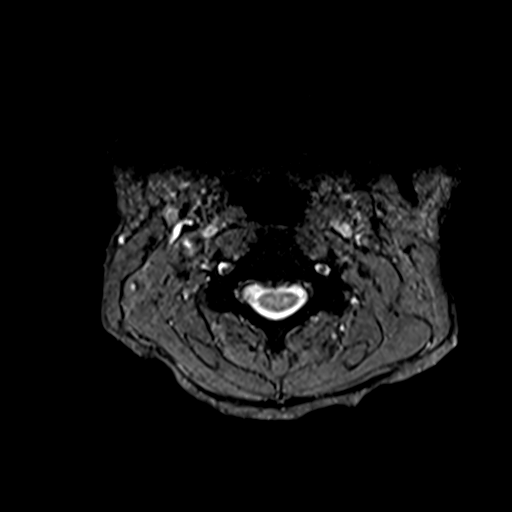

[35 of 48 positions shown; findings below may reference images not displayed]

FINDINGS: Alignment: Straightening with smooth reversal of the normal cervical
lordosis. No listhesis.

Vertebrae: Mild degenerative height loss present at the C5 and C6
vertebral bodies. Vertebral body height otherwise maintained without
acute or chronic fracture. Bone marrow signal intensity within
normal limits. No discrete or worrisome osseous lesions or abnormal
marrow edema.

Cord: Normal signal morphology.

Posterior Fossa, vertebral arteries, paraspinal tissues: Hazy signal
abnormality involving the visualized pons, likely related to chronic
microvascular ischemic disease. Visualized brain and posterior fossa
otherwise unremarkable. Craniocervical junction normal. Paraspinous
soft tissues within normal limits. Normal flow voids seen within the
vertebral arteries bilaterally.

Disc levels:

C2-C3: Shallow central disc protrusion mildly indents the ventral
thecal sac. No significant spinal stenosis or cord deformity.
Foramina remain patent.

C3-C4: Broad-based central disc protrusion indents the ventral
thecal sac (series 9, image 8). Minimal flattening of the ventral
cord without cord signal changes or significant spinal stenosis.
Superimposed left-sided uncovertebral hypertrophy with mild left C4
foraminal stenosis. Right neural foramen remains patent.

C4-C5: Mild disc bulge with uncovertebral hypertrophy. Flattening
and partial effacement of the ventral thecal sac without significant
spinal stenosis. Foramina remain patent.

C5-C6: Mild disc bulge with bilateral uncovertebral hypertrophy.
Flattening of the ventral thecal sac without significant spinal
stenosis. Borderline mild right C6 foraminal stenosis. Left neural
foramina remains patent.

C6-C7: Degenerative intervertebral disc space narrowing with diffuse
disc bulge and bilateral uncovertebral spurring, right worse than
left. Broad posterior component flattens and partially faces the
ventral thecal sac. Mild spinal stenosis without frank cord
impingement. Severe right worse than left C7 foraminal stenosis.
This is favored to be the symptomatic level.

C7-T1: Mild disc bulge with uncovertebral hypertrophy. Mild facet
degeneration. No spinal stenosis. Foramina remain patent.

Visualized upper thoracic spine demonstrates no significant finding.
IMPRESSION: 1. Degenerative disc bulge with uncovertebral hypertrophy at C6-7
with resultant mild canal and severe right worse than left C7
foraminal stenosis. This is favored to be the symptomatic level.
2. Central disc protrusion at C3-4 without significant stenosis.
Mild left C4 foraminal narrowing at this level related to
uncovertebral spurring.
3. Additional mild noncompressive disc bulging elsewhere within the
cervical spine without significant stenosis or neural impingement.

## 2023-11-24 ENCOUNTER — Ambulatory Visit: Admitting: Podiatry

## 2023-12-01 ENCOUNTER — Ambulatory Visit (INDEPENDENT_AMBULATORY_CARE_PROVIDER_SITE_OTHER): Admitting: Podiatry

## 2023-12-01 ENCOUNTER — Encounter: Payer: Self-pay | Admitting: Podiatry

## 2023-12-01 DIAGNOSIS — M79674 Pain in right toe(s): Secondary | ICD-10-CM | POA: Diagnosis not present

## 2023-12-01 DIAGNOSIS — B351 Tinea unguium: Secondary | ICD-10-CM | POA: Diagnosis not present

## 2023-12-01 DIAGNOSIS — M79675 Pain in left toe(s): Secondary | ICD-10-CM

## 2023-12-01 NOTE — Progress Notes (Signed)
This patient returns to my office for at risk foot care.  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  This patient is unable to cut nails himself since the patient cannot reach his nails.These nails are painful walking and wearing shoes.  This patient presents for at risk foot care today.  General Appearance  Alert, conversant and in no acute stress.  Vascular  Dorsalis pedis and posterior tibial  pulses are palpable  bilaterally.  Capillary return is within normal limits  bilaterally. Temperature is within normal limits  bilaterally.  Neurologic  Senn-Weinstein monofilament wire test within normal limits  bilaterally. Muscle power within normal limits bilaterally.  Nails Thick disfigured discolored nails with subungual debris  from hallux to fifth toes bilaterally. No evidence of bacterial infection or drainage bilaterally.  Orthopedic  No limitations of motion  feet .  No crepitus or effusions noted.  No bony pathology or digital deformities noted.  Skin  normotropic skin with no porokeratosis noted bilaterally.  No signs of infections or ulcers noted.     Onychomycosis  Pain in right toes  Pain in left toes  Consent was obtained for treatment procedures.   Mechanical debridement of nails 1-5  bilaterally performed with a nail nipper.  Filed with dremel without incident.    Return office visit    3 months                  Told patient to return for periodic foot care and evaluation due to potential at risk complications.   Rachid Parham DPM   

## 2023-12-08 ENCOUNTER — Telehealth: Payer: Self-pay

## 2023-12-08 NOTE — Telephone Encounter (Signed)
 The patient called requesting to schedule an appointment for the removal of his feeding tube. He reported that he went to the hospital yesterday and did not leave until this morning. The patient mentioned that part of the tube keeps falling off, and another section has snapped. He indicated that he is in significant pain and no longer uses the feeding tube. I informed the patient that he would need to contact [facility] for assistance, as we currently do not have any available appointments for new patients. [facility] was the last facility where he was seen. The patient was unsure who performed the procedure but stated he would contact his PCP, as they referred him to our office.

## 2023-12-10 ENCOUNTER — Encounter: Payer: Self-pay | Admitting: Surgery

## 2023-12-10 ENCOUNTER — Ambulatory Visit (INDEPENDENT_AMBULATORY_CARE_PROVIDER_SITE_OTHER): Admitting: Surgery

## 2023-12-10 VITALS — BP 104/67 | HR 97 | Temp 98.1°F | Ht 72.0 in | Wt 138.0 lb

## 2023-12-10 DIAGNOSIS — K9423 Gastrostomy malfunction: Secondary | ICD-10-CM | POA: Diagnosis not present

## 2023-12-10 NOTE — Progress Notes (Unsigned)
 12/10/2023  Reason for Visit:  Removal of gastrostomy tube  Requesting Provider:  Erskine Speed, FNP  History of Present Illness: Benjamin Bolton is a 63 y.o. male presenting for evaluation of gastrostomy tube removal.  The patient had a prolonged admission on 09/10/23 with acute on chronic respiratory failure due to RSV with pneumonia in the setting of significant COPD.  He required intubation but did not require tracheostomy.  He did require a percutaneous gastrostomy tube placement with IR on 10/21/23.  He was eventually discharged and he has now recovered to a point that he is eating on his own and has not required any tube feeds in a few weeks.  He reports that the two sutures that were tacking the tube have also fallen off, and the tube itself causes him pain.  He would like to have it removed.  Of note, he remains on Prednisone 20 mg daily, and he is O2 dependent.  He is also on Eliquis for history of DVT.  Past Medical History: Past Medical History:  Diagnosis Date   COPD (chronic obstructive pulmonary disease) (HCC)    DVT (deep venous thrombosis) (HCC)    Dyspnea    GERD (gastroesophageal reflux disease)    no meds   HLD (hyperlipidemia)    Hx of migraines    Oxygen dependent    3 L [place]   Pneumonia 2023   Pre-diabetes    Pulmonary nodule    Rotator cuff tear, right    Tobacco abuse    Tobacco use      Past Surgical History: Past Surgical History:  Procedure Laterality Date   CHEST TUBE INSERTION     IR GASTROSTOMY TUBE MOD SED  10/21/2023   SHOULDER ARTHROSCOPY WITH SUBACROMIAL DECOMPRESSION, ROTATOR CUFF REPAIR AND BICEP TENDON REPAIR Right 02/18/2023   Procedure: RIGHT SHOULDER ARTHROSCOPY WITH DEBRIDEMENT, DECOMPRESSION, ROTATOR CUFF REPAIR, BICEPS TENODESIS.;  Surgeon: Christena Flake, MD;  Location: [facility] ORS;  Service: Orthopedics;  Laterality: Right;    Home Medications: Prior to Admission medications   Medication Sig Start Date End Date Taking? Authorizing  Provider  predniSONE (DELTASONE) 20 MG tablet Take 20 mg by mouth daily with breakfast. 11/26/23  Yes [provider]  acetaminophen (TYLENOL) 325 MG tablet Take 2 tablets (650 mg total) by mouth every 6 (six) hours as needed for mild pain (pain score 1-3) or fever. 10/28/23   Lolita Patella B, MD  albuterol (PROVENTIL) (2.5 MG/3ML) 0.083% nebulizer solution USE 1 VIAL IN NEBULIZER EVERY 6 HOURS AS NEEDED FOR WHEEZING 06/28/21   [provider]  albuterol (VENTOLIN HFA) 108 (90 Base) MCG/ACT inhaler Inhale 1 puff into the lungs every 4 (four) hours as needed for wheezing or shortness of breath.    [provider]  apixaban (ELIQUIS) 5 MG TABS tablet Take 1 tablet (5 mg total) by mouth 2 (two) times daily. 10/28/23   Tresa Moore, MD  ascorbic acid (VITAMIN C) 500 MG tablet Take 1 tablet (500 mg total) by mouth 2 (two) times daily. 10/28/23   Tresa Moore, MD  feeding supplement (ENSURE ENLIVE / ENSURE PLUS) LIQD Take 237 mLs by mouth 2 (two) times daily between meals. 10/29/23   Tresa Moore, MD  gabapentin (NEURONTIN) 300 MG capsule Take 1 capsule (300 mg total) by mouth 3 (three) times daily. 10/28/23   Tresa Moore, MD  guaiFENesin (MUCINEX) 600 MG 12 hr tablet Take 1 tablet (600 mg total) by mouth 2 (two) times  daily as needed for cough or to loosen phlegm. 10/28/23   Tresa Moore, MD  iron polysaccharides (NIFEREX) 150 MG capsule Take 1 capsule (150 mg total) by mouth daily. 10/29/23   Tresa Moore, MD  midodrine (PROAMATINE) 5 MG tablet Take 1 tablet (5 mg total) by mouth 3 (three) times daily with meals. 10/28/23   Tresa Moore, MD  Multiple Vitamin (MULTIVITAMIN WITH MINERALS) TABS tablet Take 1 tablet by mouth daily. 10/29/23   Sreenath, Sudheer B, MD  nicotine (NICODERM CQ - DOSED IN MG/24 HOURS) 14 mg/24hr patch Place 1 patch (14 mg total) onto the skin daily. 10/29/23   Tresa Moore, MD  Nutritional Supplements  (FEEDING SUPPLEMENT, OSMOLITE 1.5 CAL,) LIQD Place 1,000 mLs into feeding tube continuous. 10/28/23   Tresa Moore, MD  ondansetron (ZOFRAN) 4 MG tablet Take 1 tablet (4 mg total) by mouth every 6 (six) hours as needed for nausea. 02/19/23   Anson Oregon, PA-C  oxybutynin (DITROPAN) 5 MG tablet Take 1 tablet (5 mg total) by mouth every 8 (eight) hours as needed for bladder spasms. 10/28/23   Tresa Moore, MD  oxyCODONE (OXY IR/ROXICODONE) 5 MG immediate release tablet Take 1-2 tablets (5-10 mg total) by mouth every 4 (four) hours as needed for severe pain (pain score 7-10). SNF use only.  Refills per SNF MD 10/28/23   Tresa Moore, MD  OXYGEN Inhale 3 L into the lungs continuous.    [provider]  pantoprazole (PROTONIX) 40 MG tablet Take 1 tablet (40 mg total) by mouth daily. 10/29/23   Tresa Moore, MD  Protein (FEEDING SUPPLEMENT, PROSOURCE ZO10,) liquid Place 60 mLs into feeding tube daily. 10/29/23   Tresa Moore, MD  tamsulosin (FLOMAX) 0.4 MG CAPS capsule Take 1 capsule (0.4 mg total) by mouth daily. 10/29/23   Phineas Semen, MD  TRELEGY ELLIPTA 100-62.5-25 MCG/ACT AEPB Inhale 1 puff into the lungs every morning. 07/04/21   [provider]  Water For Irrigation, Sterile (FREE WATER) SOLN Place 30 mLs into feeding tube every 4 (four) hours. 10/28/23   Tresa Moore, MD    Allergies: No Known Allergies  Social History:  reports that he has been smoking cigarettes. He has a 12 pack-year smoking history. He has never used smokeless tobacco. He reports current alcohol use. He reports that he does not use drugs.   Family History: History reviewed. No pertinent family history.  Review of Systems: Review of Systems  Constitutional:  Negative for chills and fever.  Respiratory:  Negative for shortness of breath.   Cardiovascular:  Negative for chest pain.  Gastrointestinal:  Positive for abdominal pain. Negative for nausea and  vomiting.  Genitourinary:  Negative for dysuria.  Skin:  Negative for rash.    Physical Exam BP 104/67   Pulse 97   Temp 98.1 F (36.7 C) (Oral)   Ht 6' (1.829 m)   Wt 138 lb (62.6 kg)   SpO2 93%   BMI 18.72 kg/m  CONSTITUTIONAL: No acute distress. HEENT:  Normocephalic, atraumatic, extraocular motion intact. RESPIRATORY:  Patient has nasal canula in place with supplemental oxygen.  With this, he has a normal respiratory effort without pathologic use of accessory muscles. CARDIOVASCULAR: Regular rhythm and rate. GI: The abdomen is soft, non-distended, with localized tenderness at the G tube site.  No skin erythema, ulceration, or drainage.  Gauze dressing in place.  MUSCULOSKELETAL:  Patient currently on a wheelchair. NEUROLOGIC:  Motor and  sensation is grossly normal.  Cranial nerves are grossly intact. PSYCH:  Alert and oriented to person, place and time. Affect is normal.  Laboratory Analysis: Labs from 11/15/23: Na 140, K 3.4, Cl 105, CO2 29, BUN 16, Cr 0.44.  WBC 8.9, Hgb 9.8, Hct 31.6, Plt 232.  Imaging: IR gastrostomy tube placement on 10/21/23: PROCEDURE: Informed written consent was obtained from the patient after a thorough discussion of the procedural risks, benefits and alternatives. All questions were addressed. Maximal Sterile Barrier Technique was utilized including caps, mask, sterile gowns, sterile gloves, sterile drape, hand hygiene and skin antiseptic. A timeout was performed prior to the initiation of the procedure.   Maximal barrier sterile technique utilized including caps, mask, sterile gowns, sterile gloves, large sterile drape, hand hygiene, and chlorhexadine skin prep.   An angled catheter was advanced over a wire under fluoroscopic guidance through the nose, down the esophagus and into the body of the stomach. The stomach was then insufflated with several 100 ml of air. Fluoroscopy confirmed location of the gastric bubble, as well as inferior displacement  of the barium stained colon. Under direct fluoroscopic guidance, 2 T tacks were placed, and the anterior gastric wall drawn up against the anterior abdominal wall. Percutaneous access was then obtained into the mid gastric body with an 18 gauge trocar needle. Aspiration of air, and injection of contrast material under fluoroscopy confirmed needle placement.   An Amplatz wire was advanced in the gastric body. A 10 x 80 mm atlas balloon was then advanced coaxially through a 20 French balloon retention percutaneous gastrostomy tube. The balloon G-tube construct was then advanced over the wire and the balloon was advanced across the percutaneous tract into the stomach. The balloon was inflated to full effacement. As the balloon was deflated, the balloon and gastrostomy tube were advanced as a unit over the wire and into the gastric lumen. The G tube retention balloon was filled with 10 mL saline and pulled snug against the anterior abdominal wall. The external bumper was fixed in place. Contrast was injected through the tube and confirmed its location within the stomach. The tube was then flushed and capped.   The patient will be observed for several hours with the newly placed tube on low wall suction to evaluate for any post procedure complication. The patient tolerated the procedure well, there is no immediate complication.   IMPRESSION: Successful placement of a 20 French balloon retention gastrostomy tube.  Assessment and Plan: This is a 63 y.o. male s/p IR gastrostomy tube placement.  --Discussed with the patient that it's just over 7 weeks since the G tube was placed.  With the tacking sutures off, the balloon of the tube is the main thing helping keep the stomach against the abdominal wall.  He is also on steroids chronically, so his wound healing is slower.  Discussed with him that I would like to wait two more weeks to give more time for things to heal and scar together prior to removal of the  tube.  He's also on Eliquis and may be prudent to pause it for a day prior to removal.  Understand that he's having pain from the tube, and it may be irritation to a perforating nerve vs cutaneous nerve.  However, waiting two more weeks would be safer. --Patient understands this.  He will follow up again in two weeks and hold his Eliquis one day prior to remove his G tube. --All questions answered.  I spent 30 minutes dedicated  to the care of this patient on the date of this encounter to include pre-visit review of records, face-to-face time with the patient discussing diagnosis and management, and any post-visit coordination of care.   Howie Ill, MD [facility]

## 2023-12-10 NOTE — Patient Instructions (Addendum)
 Your last day to take Eliquis is on 12/22/23. We will see you on 12/24/23 to remove your PEg tube.   Removing a PEG Tube: What to Expect  A PEG tube (percutaneous endoscopic gastrostomy tube) is a feeding tube that is used to put food, medicine, and fluids straight into your stomach. It is placed into your stomach through a small cut in your belly. You may have a PEG tube if: You have trouble swallowing. You don't feel hungry, but you need to eat more. You haven't been able to eat and drink enough to keep your body healthy. When you don't need the PEG tube anymore, your health care provider will do a simple procedure to take it out. Tell your health care provider about: Any allergies you have. All medicines you take. These include vitamins, herbs, eye drops, and creams. Any problems you or family members have had with anesthesia. Any bleeding problems you have. Any surgeries you've had. Any medical conditions you have. Whether you're pregnant or may be pregnant. What are the risks? Your provider will talk with you about risks. These may include: Infection. Bleeding. Damage to nearby structures or organs, such as the stomach, intestines, or liver. Leaking of stomach contents. Allergies to medicines. What happens before the procedure? Ask about changing or stopping: Any medicines you take. Any vitamins, herbs, or supplements you take. Do not take aspirin or ibuprofen unless you're told to. Eat and drink as told. Do not smoke, vape, or use nicotine or tobacco for at least 4 weeks before the procedure. What happens during the procedure?  You'll lie on your back with your belly exposed. The area around the PEG tube will be cleaned with a soap that kills germs. You may be given anesthesia to keep you from feeling pain. If you have an inflatable balloon at the end of your PEG tube: A syringe will be used to deflate the balloon. Your provider will pull out the tube. You may feel some  pressure or stinging. A bandage will be placed. These steps may vary. Ask what you can expect. What happens after the procedure? You'll be watched closely until you leave. This includes checking your pain level, blood pressure, heart rate, and breathing rate. This information is not intended to replace advice given to you by your health care provider. Make sure you discuss any questions you have with your health care provider. Document Revised: 03/25/2023 Document Reviewed: 03/25/2023 Elsevier Patient Education  2024 [employer].

## 2023-12-15 ENCOUNTER — Other Ambulatory Visit: Payer: Self-pay

## 2023-12-15 ENCOUNTER — Emergency Department
Admission: EM | Admit: 2023-12-15 | Discharge: 2023-12-15 | Disposition: A | Discharge status: Home / Self Care | Attending: Emergency Medicine | Admitting: Emergency Medicine

## 2023-12-15 DIAGNOSIS — Z760 Encounter for issue of repeat prescription: Secondary | ICD-10-CM | POA: Diagnosis present

## 2023-12-15 MED ORDER — OXYCODONE HCL 5 MG PO TABS: 5.0000 mg | ORAL_TABLET | Freq: Three times a day (TID) | ORAL | 0 refills | Status: AC | PRN

## 2023-12-15 NOTE — ED Triage Notes (Signed)
 Pt here needing a medication refilll. Pt has a feeding tube that is causing him a lot of pain and is out of his pain medication. Pt does not have an appt for removal until 12/24/23. Pt denies any symptoms.

## 2023-12-15 NOTE — ED Provider Notes (Signed)
[facility] Provider Note    Event Date/Time   First MD Initiated Contact with Patient 12/15/23 1436     (approximate)   History   Medication Refill   HPI  Benjamin Bolton is a 63 y.o. male patient has feeding tube that is causing him a lot of pain and is out of his pain medication.  Patient is scheduled for removal on 12/24/2023.  No fever or chills.  Basically wants pain refill      Physical Exam   Triage Vital Signs: ED Triage Vitals [12/15/23 1433]  Encounter Vitals Group     BP 127/70     Systolic BP Percentile      Diastolic BP Percentile      Pulse Rate (!) 102     Resp 19     Temp 98 F (36.7 C)     Temp Source Oral     SpO2 95 %     Weight 138 lb 0.1 oz (62.6 kg)     Height 6' (1.829 m)     Head Circumference      Peak Flow      Pain Score 10     Pain Loc      Pain Education      Exclude from Growth Chart     Most recent vital signs: Vitals:   12/15/23 1433  BP: 127/70  Pulse: (!) 102  Resp: 19  Temp: 98 F (36.7 C)  SpO2: 95%     General: Awake, no distress.   CV:  Good peripheral perfusion. regular rate and  rhythm Resp:  Normal effort.  Abd:  No distention.   Other:  No drainage from the wound   ED Results / Procedures / Treatments   Labs (all labs ordered are listed, but only abnormal results are displayed) Labs Reviewed - No data to display   EKG     RADIOLOGY     PROCEDURES:   Procedures Chief Complaint  Patient presents with   Medication Refill      MEDICATIONS ORDERED IN ED: Medications - No data to display   IMPRESSION / MDM / ASSESSMENT AND PLAN / ED COURSE  I reviewed the triage vital signs and the nursing notes.                              Differential diagnosis includes, but is not limited to, COVID refill, malingering, wound check  Patient's presentation is most consistent with acute, uncomplicated illness.  Patient has no fever/chills.  Basically wants pain  medications.  Will give him a few oxycodone to hold until he can see his doctor in the next couple of days.  He is in agreement treatment plan.  Discharged stable condition.  No concerns for infection at this time      FINAL CLINICAL IMPRESSION(S) / ED DIAGNOSES   Final diagnoses:  Medication refill     Rx / DC Orders   ED Discharge Orders          Ordered    oxyCODONE (ROXICODONE) 5 MG immediate release tablet  Every 8 hours PRN        12/15/23 1438             Note:  This document was prepared using Dragon voice recognition software and may include unintentional dictation errors.    Faythe Ghee, PA-C 12/15/23 1442    Corena Herter, MD  12/15/23 1528  

## 2023-12-24 ENCOUNTER — Ambulatory Visit (INDEPENDENT_AMBULATORY_CARE_PROVIDER_SITE_OTHER): Admitting: Surgery

## 2023-12-24 ENCOUNTER — Encounter: Payer: Self-pay | Admitting: Surgery

## 2023-12-24 VITALS — BP 116/70 | HR 102 | Ht 72.0 in | Wt 138.0 lb

## 2023-12-24 DIAGNOSIS — Z431 Encounter for attention to gastrostomy: Secondary | ICD-10-CM

## 2023-12-24 DIAGNOSIS — K9423 Gastrostomy malfunction: Secondary | ICD-10-CM

## 2023-12-24 NOTE — Progress Notes (Signed)
  Procedure Date:  12/24/2023  Pre-operative Diagnosis:  Gastrostomy tube in place  Post-operative Diagnosis:  Gastrostomy tube in place  Procedure:  Removal of gastrostomy tube  Surgeon:  Marene Shape, MD  Anesthesia:  None  Estimated Blood Loss:  1 ml  Specimens:  None  Complications:  None  Indications for Procedure:  This is a 63 y.o. male with prior IR percutaneous gastrostomy tube placement on 10/21/23.  He no longer needs it and the patient wishes to have this removed. The risks of bleeding, abscess or infection, injury to surrounding structures, and need for further procedures were all discussed with the patient and he was willing to proceed.  Description of Procedure: The patient was correctly identified at bedside.  The patient was placed supine.  Appropriate time-outs were performed.  The patient's dressings were removed.  There was some skin excoriation likely from leakage of fluid around the tube.  The balloon was deflated and had 8 ml of fluid and 2 ml of air.  Then, the tube was removed.  There was no resistance and the tube slid out easily.  Some gastric contents leaked out through the opening.  The skin was cleaned and a dry gauze dressing was applied.  The patient tolerated the procedure well.  --Patient may shower tomorrow --Continue dry gauze dressing at least once daily and as needed to keep the area clean/dry. --Discussed that the wound will close on its own. --Follow up as needed.  I spent 20 minutes dedicated to the care of this patient on the date of this encounter to include pre-visit review of records, face-to-face time with the patient discussing diagnosis and management, and any post-visit coordination of care.  Marene Shape, MD

## 2023-12-24 NOTE — Patient Instructions (Signed)
 Change your dressing twice a day or more often the first 3-4 days. Than once a day or more often until the area stops draining.    If after 3 weeks the area is still draining call us  to schedule a follow up appointment.   You may restart your Eliquis  tomorrow.    Follow-up with our office as needed.  Please call and ask to speak with a nurse if you develop questions or concerns.

## 2024-01-30 ENCOUNTER — Other Ambulatory Visit: Payer: Self-pay | Admitting: Student

## 2024-01-30 DIAGNOSIS — M7581 Other shoulder lesions, right shoulder: Secondary | ICD-10-CM

## 2024-01-30 DIAGNOSIS — M7521 Bicipital tendinitis, right shoulder: Secondary | ICD-10-CM

## 2024-01-30 DIAGNOSIS — Z9889 Other specified postprocedural states: Secondary | ICD-10-CM

## 2024-01-30 DIAGNOSIS — M75121 Complete rotator cuff tear or rupture of right shoulder, not specified as traumatic: Secondary | ICD-10-CM

## 2024-02-09 ENCOUNTER — Ambulatory Visit
Admission: RE | Admit: 2024-02-09 | Discharge: 2024-02-09 | Disposition: A | Discharge status: Home / Self Care | Source: Ambulatory Visit | Attending: Student | Admitting: Student

## 2024-02-09 DIAGNOSIS — M75121 Complete rotator cuff tear or rupture of right shoulder, not specified as traumatic: Secondary | ICD-10-CM | POA: Insufficient documentation

## 2024-02-09 DIAGNOSIS — M7581 Other shoulder lesions, right shoulder: Secondary | ICD-10-CM | POA: Insufficient documentation

## 2024-02-09 DIAGNOSIS — Z9889 Other specified postprocedural states: Secondary | ICD-10-CM | POA: Insufficient documentation

## 2024-02-09 DIAGNOSIS — M7521 Bicipital tendinitis, right shoulder: Secondary | ICD-10-CM | POA: Diagnosis present

## 2024-03-02 ENCOUNTER — Ambulatory Visit: Admitting: Podiatry

## 2024-03-15 ENCOUNTER — Ambulatory Visit (INDEPENDENT_AMBULATORY_CARE_PROVIDER_SITE_OTHER): Admitting: Podiatry

## 2024-03-15 ENCOUNTER — Encounter: Payer: Self-pay | Admitting: Podiatry

## 2024-03-15 DIAGNOSIS — D689 Coagulation defect, unspecified: Secondary | ICD-10-CM | POA: Diagnosis not present

## 2024-03-15 DIAGNOSIS — M79674 Pain in right toe(s): Secondary | ICD-10-CM | POA: Diagnosis not present

## 2024-03-15 DIAGNOSIS — M79675 Pain in left toe(s): Secondary | ICD-10-CM

## 2024-03-15 DIAGNOSIS — B351 Tinea unguium: Secondary | ICD-10-CM | POA: Diagnosis not present

## 2024-03-15 NOTE — Progress Notes (Signed)
This patient returns to my office for at risk foot care.  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  This patient is unable to cut nails himself since the patient cannot reach his nails.These nails are painful walking and wearing shoes.  This patient presents for at risk foot care today.  General Appearance  Alert, conversant and in no acute stress.  Vascular  Dorsalis pedis and posterior tibial  pulses are palpable  bilaterally.  Capillary return is within normal limits  bilaterally. Temperature is within normal limits  bilaterally.  Neurologic  Senn-Weinstein monofilament wire test within normal limits  bilaterally. Muscle power within normal limits bilaterally.  Nails Thick disfigured discolored nails with subungual debris  from hallux to fifth toes bilaterally. No evidence of bacterial infection or drainage bilaterally.  Orthopedic  No limitations of motion  feet .  No crepitus or effusions noted.  No bony pathology or digital deformities noted.  Skin  normotropic skin with no porokeratosis noted bilaterally.  No signs of infections or ulcers noted.     Onychomycosis  Pain in right toes  Pain in left toes  Consent was obtained for treatment procedures.   Mechanical debridement of nails 1-5  bilaterally performed with a nail nipper.  Filed with dremel without incident.    Return office visit    3 months                  Told patient to return for periodic foot care and evaluation due to potential at risk complications.   Rachid Parham DPM   

## 2024-04-23 ENCOUNTER — Encounter: Payer: Self-pay | Admitting: Radiology

## 2024-05-03 HISTORY — PX: CATARACT EXTRACTION, BILATERAL: SHX1313

## 2024-05-27 ENCOUNTER — Other Ambulatory Visit: Payer: Self-pay | Admitting: Surgery

## 2024-06-10 ENCOUNTER — Other Ambulatory Visit: Payer: Self-pay

## 2024-06-10 ENCOUNTER — Encounter
Admission: RE | Admit: 2024-06-10 | Discharge: 2024-06-10 | Disposition: A | Discharge status: Home / Self Care | Source: Ambulatory Visit | Attending: Surgery | Admitting: Surgery

## 2024-06-10 VITALS — BP 130/87 | HR 75 | Resp 18 | Wt 143.0 lb

## 2024-06-10 DIAGNOSIS — Z01812 Encounter for preprocedural laboratory examination: Secondary | ICD-10-CM

## 2024-06-10 DIAGNOSIS — E119 Type 2 diabetes mellitus without complications: Secondary | ICD-10-CM | POA: Insufficient documentation

## 2024-06-10 DIAGNOSIS — M19011 Primary osteoarthritis, right shoulder: Secondary | ICD-10-CM

## 2024-06-10 DIAGNOSIS — Z01818 Encounter for other preprocedural examination: Secondary | ICD-10-CM | POA: Insufficient documentation

## 2024-06-10 HISTORY — DX: Unspecified protein-calorie malnutrition: E46

## 2024-06-10 HISTORY — DX: Gastrostomy status: Z93.1

## 2024-06-10 HISTORY — DX: Atherosclerosis of aorta: I70.0

## 2024-06-10 HISTORY — DX: Unspecified convulsions: R56.9

## 2024-06-10 HISTORY — DX: Anemia, unspecified: D64.9

## 2024-06-10 HISTORY — DX: Long term (current) use of anticoagulants: Z79.01

## 2024-06-10 HISTORY — DX: Other specified postprocedural states: Z98.890

## 2024-06-10 HISTORY — DX: Other shoulder lesions, right shoulder: M75.81

## 2024-06-10 HISTORY — DX: Dysphonia: R49.0

## 2024-06-10 HISTORY — DX: Type 2 diabetes mellitus without complications: E11.9

## 2024-06-10 LAB — URINALYSIS, ROUTINE W REFLEX MICROSCOPIC
Bilirubin Urine: NEGATIVE
Glucose, UA: 150 mg/dL — AB
Hgb urine dipstick: NEGATIVE
Ketones, ur: NEGATIVE mg/dL
Leukocytes,Ua: NEGATIVE
Nitrite: NEGATIVE
Protein, ur: NEGATIVE mg/dL
Specific Gravity, Urine: 1.024 (ref 1.005–1.030)
pH: 6 (ref 5.0–8.0)

## 2024-06-10 LAB — SURGICAL PCR SCREEN
MRSA, PCR: NEGATIVE
Staphylococcus aureus: NEGATIVE

## 2024-06-10 LAB — HEMOGLOBIN A1C
Hgb A1c MFr Bld: 6.4 % — ABNORMAL HIGH (ref 4.8–5.6)
Mean Plasma Glucose: 136.98 mg/dL

## 2024-06-10 NOTE — Patient Instructions (Addendum)
 Your procedure is scheduled on:06-17-24 Thursday Report to the Registration Desk on the 1st floor of the [facility].Then proceed to the 2nd floor Surgery Desk To find out your arrival time, please call [phone] between 1PM - 3PM on:06-16-24 Wednesday If your arrival time is 6:00 am, do not arrive before that time as the [facility] entrance doors do not open until 6:00 am.  REMEMBER: Instructions that are not followed completely may result in serious medical risk, up to and including death; or upon the discretion of your surgeon and anesthesiologist your surgery may need to be rescheduled.  Do not eat food after midnight the night before surgery.  No gum chewing or hard candies.  You may however, drink Water  up to 2 hours before you are scheduled to arrive for your surgery. Do not drink anything within 2 hours of your scheduled arrival time.  In addition, your doctor has ordered for you to drink the provided:  Gatorade G2 Drinking this carbohydrate drink up to two hours before surgery helps to reduce insulin  resistance and improve patient outcomes. Please complete drinking 2 hours before scheduled arrival time.  One week prior to surgery: Stop Anti-inflammatories (NSAIDS) such as Advil , Aleve, Ibuprofen , Motrin , Naproxen, Naprosyn and Aspirin based products such as Excedrin, Goody's Powder, BC Powder. Stop ANY OVER THE COUNTER supplements until after surgery (Vitamin C )  You may however, continue to take Tylenol  if needed for pain up until the day of surgery.  Stop your apixaban  (ELIQUIS )  3 days prior to surgery-Last dose will be on 06-13-24 Sunday  Continue taking all of your other prescription medications up until the day of surgery.  ON THE DAY OF SURGERY ONLY TAKE THESE MEDICATIONS WITH SIPS OF WATER : -gabapentin  (NEURONTIN )  -midodrine  (PROAMATINE )  -omeprazole (PRILOSEC OTC)  -predniSONE  (DELTASONE )  -You may take oxycodone  (OXY-IR) if needed for pain  Use your  Albuterol  Nebulizer and your Trelegy Ellipta the day of surgery and bring your Albuterol  Inhaler to the hospital  No Alcohol  for 24 hours before or after surgery.  No Smoking including e-cigarettes for 24 hours before surgery.  No chewable tobacco products for at least 6 hours before surgery.  No nicotine  patches on the day of surgery.  Do not use any recreational drugs for at least a week (preferably 2 weeks) before your surgery.  Please be advised that the combination of cocaine and anesthesia may have negative outcomes, up to and including death. If you test positive for cocaine, your surgery will be cancelled.  On the morning of surgery brush your teeth with toothpaste and water , you may rinse your mouth with mouthwash if you wish. Do not swallow any toothpaste or mouthwash.  Use CHG wipes as directed on instruction sheet.  Do not wear jewelry, make-up, hairpins, clips or nail polish.  For welded (permanent) jewelry: bracelets, anklets, waist bands, etc.  Please have this removed prior to surgery.  If it is not removed, there is a chance that hospital personnel will need to cut it off on the day of surgery.  Do not wear lotions, powders, or perfumes.   Do not shave body hair from the neck down 48 hours before surgery.  Contact lenses, hearing aids and dentures may not be worn into surgery.  Do not bring valuables to the hospital. [facility] is not responsible for any missing/lost belongings or valuables.   Total Shoulder Arthroplasty:  use Benzoyl Peroxide 5% Gel as directed on instruction sheet.   Notify your doctor  if there is any change in your medical condition (cold, fever, infection).  Wear comfortable clothing (specific to your surgery type) to the hospital.  After surgery, you can help prevent lung complications by doing breathing exercises.  Take deep breaths and cough every 1-2 hours. Your doctor may order a device called an Incentive Spirometer to help you take  deep breaths. When coughing or sneezing, hold a pillow firmly against your incision with both hands. This is called "splinting." Doing this helps protect your incision. It also decreases belly discomfort.  If you are being admitted to the hospital overnight, leave your suitcase in the car. After surgery it may be brought to your room.  In case of increased patient census, it may be necessary for you, the patient, to continue your postoperative care in the Same Day Surgery department.  If you are being discharged the day of surgery, you will not be allowed to drive home. You will need a responsible individual to drive you home and stay with you for 24 hours after surgery.   If you are taking public transportation, you will need to have a responsible individual with you.  Please call the Pre-admissions Testing Dept. at [phone] if you have any questions about these instructions.  Surgery Visitation Policy:  Patients having surgery or a procedure may have two visitors.  Children under the age of 50 must have an adult with them who is not the patient.  Inpatient Visitation:    Visiting hours are 7 a.m. to 8 p.m. Up to four visitors are allowed at one time in a patient room. The visitors may rotate out with other people during the day.  One visitor age 77 or older may stay with the patient overnight and must be in the room by 8 p.m.     Pre-operative 4 CHG Bath Instructions   You can play a key role in reducing the risk of infection after surgery. Your skin needs to be as free of germs as possible. You can reduce the number of germs on your skin by washing with CHG (chlorhexidine  gluconate) soap before surgery. CHG is an antiseptic soap that kills germs and continues to kill germs even after washing.   DO NOT use if you have an allergy to chlorhexidine /CHG or antibacterial soaps. If your skin becomes reddened or irritated, stop using the CHG and notify one of our RNs at [phone].    Please shower with the CHG soap starting 4 days before surgery using the following schedule:     Please keep in mind the following:  DO NOT shave, including legs and underarms, starting the day of your first shower.   You may shave your face at any point before/day of surgery.  Place clean sheets on your bed the day you start using CHG soap. Use a clean washcloth (not used since being washed) for each shower. DO NOT sleep with pets once you start using the CHG.   CHG Shower Instructions:  If you choose to wash your hair and private area, wash first with your normal shampoo/soap.  After you use shampoo/soap, rinse your hair and body thoroughly to remove shampoo/soap residue.  Turn the water  OFF and apply about 3 tablespoons (45 ml) of CHG soap to a CLEAN washcloth.  Apply CHG soap ONLY FROM YOUR NECK DOWN TO YOUR TOES (washing for 3-5 minutes)  DO NOT use CHG soap on face, private areas, open wounds, or sores.  Pay special attention to the area  where your surgery is being performed.  If you are having back surgery, having someone wash your back for you may be helpful. Wait 2 minutes after CHG soap is applied, then you may rinse off the CHG soap.  Pat dry with a clean towel  Put on clean clothes/pajamas   If you choose to wear lotion, please use ONLY the CHG-compatible lotions on the back of this paper.     Additional instructions for the day of surgery: DO NOT APPLY any lotions, deodorants, cologne, or perfumes.   Put on clean/comfortable clothes.  Brush your teeth.  Ask your nurse before applying any prescription medications to the skin.      CHG Compatible Lotions   Aveeno Moisturizing lotion  Cetaphil Moisturizing Cream  Cetaphil Moisturizing Lotion  Clairol Herbal Essence Moisturizing Lotion, Dry Skin  Clairol Herbal Essence Moisturizing Lotion, Extra Dry Skin  Clairol Herbal Essence Moisturizing Lotion, Normal Skin  Curel Age Defying Therapeutic Moisturizing Lotion  with Alpha Hydroxy  Curel Extreme Care Body Lotion  Curel Soothing Hands Moisturizing Hand Lotion  Curel Therapeutic Moisturizing Cream, Fragrance-Free  Curel Therapeutic Moisturizing Lotion, Fragrance-Free  Curel Therapeutic Moisturizing Lotion, Original Formula  Eucerin Daily Replenishing Lotion  Eucerin Dry Skin Therapy Plus Alpha Hydroxy Crme  Eucerin Dry Skin Therapy Plus Alpha Hydroxy Lotion  Eucerin Original Crme  Eucerin Original Lotion  Eucerin Plus Crme Eucerin Plus Lotion  Eucerin TriLipid Replenishing Lotion  Keri Anti-Bacterial Hand Lotion  Keri Deep Conditioning Original Lotion Dry Skin Formula Softly Scented  Keri Deep Conditioning Original Lotion, Fragrance Free Sensitive Skin Formula  Keri Lotion Fast Absorbing Fragrance Free Sensitive Skin Formula  Keri Lotion Fast Absorbing Softly Scented Dry Skin Formula  Keri Original Lotion  Keri Skin Renewal Lotion Keri Silky Smooth Lotion  Keri Silky Smooth Sensitive Skin Lotion  Nivea Body Creamy Conditioning Oil  Nivea Body Extra Enriched Lotion  Nivea Body Original Lotion  Nivea Body Sheer Moisturizing Lotion Nivea Crme  Nivea Skin Firming Lotion  NutraDerm 30 Skin Lotion  NutraDerm Skin Lotion  NutraDerm Therapeutic Skin Cream  NutraDerm Therapeutic Skin Lotion  ProShield Protective Hand Cream  Provon moisturizing lotion  Preparing the Skin Before Surgery  To help prevent the risk of infection at your surgical site, we are now providing you with rinse-free Sage 2% Chlorhexidine  Gluconate (CHG) disposable wipes.  Chlorhexidine  Gluconate (CHG) Soap  o An antiseptic cleaner that kills germs and bonds with the skin to continue killing germs even after washing  o Used for showering the night before surgery and morning of surgery  The night before surgery: Shower or bathe with warm water . Do not apply perfume, lotions, powders. Wait one hour after shower. Skin should be dry and cool. Open Sage wipe package -  use 6 disposable cloths. Wipe body using one cloth for the right arm, one cloth for the left arm, one cloth for the right leg, one cloth for the left leg, one cloth for the chest/abdomen area, and one cloth for the back. Do not use on open wounds or sores. Do not use on face or genitals (private parts). If you are breast feeding, do not use on breasts. 5. Do not rinse, allow to dry. 6. Skin may feel tacky for several minutes. 7. Dress in clean clothes. 8. Place clean sheets on your bed and do not sleep with pets.  REPEAT ABOVE ON THE MORNING OF SURGERY BEFORE ARRIVING TO THE HOSPITAL.   Preparing for Total Shoulder Arthroplasty  Before surgery,  you can play an important role by reducing the number of germs on your skin by using the following products:  Benzoyl Peroxide Gel  o Reduces the number of germs present on the skin  o Applied twice a day to shoulder area starting two days before surgery  Chlorhexidine  Gluconate (CHG) Soap  o An antiseptic cleaner that kills germs and bonds with the skin to continue killing germs even after washing  o Used for showering the night before surgery and morning of surgery  BENZOYL PEROXIDE 5% GEL                               Please do not use if you have an allergy to benzoyl peroxide. If your skin becomes reddened/irritated stop using the benzoyl peroxide.  Starting two days before surgery, apply as follows:  1. Apply benzoyl peroxide in the morning and at night. Apply after taking a shower. If you are not taking a shower, clean entire shoulder front, back, and side along with the armpit with a clean wet washcloth.  2. Place a quarter-sized dollop on your shoulder and rub in thoroughly, making sure to cover the front, back, and side of your shoulder, along with the armpit.  2 days before ____ AM ____ PM 1 day before ____ AM ____ PM  3. Do this twice a day for two days. (Last application is the night before surgery, AFTER using the CHG  soap).  4. Do NOT apply benzoyl peroxide gel on the day of surgery.     How to Use an Incentive Spirometer An incentive spirometer is a tool that measures how well you are filling your lungs with each breath. Learning to take long, deep breaths using this tool can help you keep your lungs clear and active. This may help to reverse or lessen your chance of developing breathing (pulmonary) problems, especially infection. You may be asked to use a spirometer: After a surgery. If you have a lung problem or a history of smoking. After a long period of time when you have been unable to move or be active. If the spirometer includes an indicator to show the highest number that you have reached, your health care provider or respiratory therapist will help you set a goal. Keep a log of your progress as told by your health care provider. What are the risks? Breathing too quickly may cause dizziness or cause you to pass out. Take your time so you do not get dizzy or light-headed. If you are in pain, you may need to take pain medicine before doing incentive spirometry. It is harder to take a deep breath if you are having pain. How to use your incentive spirometer  Sit up on the edge of your bed or on a chair. Hold the incentive spirometer so that it is in an upright position. Before you use the spirometer, breathe out normally. Place the mouthpiece in your mouth. Make sure your lips are closed tightly around it. Breathe in slowly and as deeply as you can through your mouth, causing the piston or the ball to rise toward the top of the chamber. Hold your breath for 3-5 seconds, or for as long as possible. If the spirometer includes a coach indicator, use this to guide you in breathing. Slow down your breathing if the indicator goes above the marked areas. Remove the mouthpiece from your mouth and breathe out normally. The piston or  ball will return to the bottom of the chamber. Rest for a few seconds,  then repeat the steps 10 or more times. Take your time and take a few normal breaths between deep breaths so that you do not get dizzy or light-headed. Do this every 1-2 hours when you are awake. If the spirometer includes a goal marker to show the highest number you have reached (best effort), use this as a goal to work toward during each repetition. After each set of 10 deep breaths, cough a few times. This will help to make sure that your lungs are clear. If you have an incision on your chest or abdomen from surgery, place a pillow or a rolled-up towel firmly against the incision when you cough. This can help to reduce pain while taking deep breaths and coughing. General tips When you are able to get out of bed: Walk around often. Continue to take deep breaths and cough in order to clear your lungs. Keep using the incentive spirometer until your health care provider says it is okay to stop using it. If you have been in the hospital, you may be told to keep using the spirometer at home. Contact a health care provider if: You are having difficulty using the spirometer. You have trouble using the spirometer as often as instructed. Your pain medicine is not giving enough relief for you to use the spirometer as told. You have a fever. Get help right away if: You develop shortness of breath. You develop a cough with bloody mucus from the lungs. You have fluid or blood coming from an incision site after you cough. Summary An incentive spirometer is a tool that can help you learn to take long, deep breaths to keep your lungs clear and active. You may be asked to use a spirometer after a surgery, if you have a lung problem or a history of smoking, or if you have been inactive for a long period of time. Use your incentive spirometer as instructed every 1-2 hours while you are awake. If you have an incision on your chest or abdomen, place a pillow or a rolled-up towel firmly against your incision  when you cough. This will help to reduce pain. Get help right away if you have shortness of breath, you cough up bloody mucus, or blood comes from your incision when you cough. This information is not intended to replace advice given to you by your health care provider. Make sure you discuss any questions you have with your health care provider. Document Revised: 06/27/2023 Document Reviewed: 06/27/2023 Elsevier Patient Education  2024 [employer].     [employer] Directory to address health-related social needs:  [email or web address]

## 2024-06-11 NOTE — Progress Notes (Signed)
 Pulmonary Clearance has been reviewed, completed, and signed by Dr. Theotis. Pulmonary clearance form has been faxed back to [facility]- Dr. Edie, [phone], along with most recent PFT and office note.

## 2024-06-15 ENCOUNTER — Ambulatory Visit: Admitting: Podiatry

## 2024-06-15 ENCOUNTER — Encounter: Payer: Self-pay | Admitting: Surgery

## 2024-06-15 NOTE — Progress Notes (Signed)
  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 Norleen PARAS Edie, MD.  Patient with a severe COPD and asthma diagnosis.  He is on supplemental oxygen at 3L/[place] [facility].  Patient is pulmonary medicine followed by Alica, MD).  His last office visit was on 04/26/2024; notes reviewed.  Patient with recent ED visits for AECOPD, asthma exacerbations, and RSV pneumonia.   In January 2025, patient presented to the ED with increased dyspnea with associated increased work of breath. He initially was requiring 6 L supplemental oxygen to reach and maintain normoxia. Given clinical deterioration, patient was intubated and required long term mechanical ventilation from 09/13/2023 - 09/29/2023 for acute on chronic hypoxemic respiratory failure due RSV infection resulting in vial pneumonia. Following intubation, patient continues to have a hoarse voice quality.   Pulmonary medicine  made aware of upcoming elective orthopedic procedure with Dr. Edie. Based on most recent visit with PCCM in 04/2024, patient reported to have clinically returned to his pulmonary baseline; SpO2 99% on 3L/[place]. Dr. Theotis (pulmonary medicine) is amenable to patient proceeding, however he notes that patient is at increased/high risk for cardiopulmonary complications due to his COPD-asthma overlap. Copy of signed clearance placed on patient's chart for review by the surgical/anesthetic teams.  No further needs from the PAT department identified at this time.  Dorise Pereyra, MSN, APRN, FNP-C, CEN [facility]  Perioperative Services Nurse Practitioner Phone: [phone] Fax: [phone] 06/15/24 11:09 AM  NOTE: This note has been prepared using Dragon dictation software. Despite my best ability to proofread, there is always the potential that unintentional transcriptional errors may still occur from this process.

## 2024-06-17 ENCOUNTER — Ambulatory Visit: Payer: Self-pay | Admitting: Urgent Care

## 2024-06-17 ENCOUNTER — Ambulatory Visit

## 2024-06-17 ENCOUNTER — Encounter
Admission: AD | Disposition: A | Discharge status: Skilled Nursing Facility | Payer: Self-pay | Source: Home / Self Care | Attending: Surgery

## 2024-06-17 ENCOUNTER — Other Ambulatory Visit: Payer: Self-pay

## 2024-06-17 ENCOUNTER — Encounter: Payer: Self-pay | Admitting: Surgery

## 2024-06-17 ENCOUNTER — Inpatient Hospital Stay
Admission: AD | Admit: 2024-06-17 | Discharge: 2024-06-23 | DRG: 483 | Disposition: A | Discharge status: Skilled Nursing Facility | Attending: Surgery | Admitting: Surgery

## 2024-06-17 DIAGNOSIS — J432 Centrilobular emphysema: Secondary | ICD-10-CM | POA: Diagnosis present

## 2024-06-17 DIAGNOSIS — M87811 Other osteonecrosis, right shoulder: Secondary | ICD-10-CM | POA: Diagnosis present

## 2024-06-17 DIAGNOSIS — J4489 Other specified chronic obstructive pulmonary disease: Secondary | ICD-10-CM | POA: Diagnosis present

## 2024-06-17 DIAGNOSIS — M19011 Primary osteoarthritis, right shoulder: Secondary | ICD-10-CM | POA: Diagnosis present

## 2024-06-17 DIAGNOSIS — Z9981 Dependence on supplemental oxygen: Secondary | ICD-10-CM

## 2024-06-17 DIAGNOSIS — Z7951 Long term (current) use of inhaled steroids: Secondary | ICD-10-CM

## 2024-06-17 DIAGNOSIS — Z9889 Other specified postprocedural states: Secondary | ICD-10-CM

## 2024-06-17 DIAGNOSIS — Z87891 Personal history of nicotine dependence: Secondary | ICD-10-CM

## 2024-06-17 DIAGNOSIS — Z833 Family history of diabetes mellitus: Secondary | ICD-10-CM

## 2024-06-17 DIAGNOSIS — E440 Moderate protein-calorie malnutrition: Secondary | ICD-10-CM

## 2024-06-17 DIAGNOSIS — E119 Type 2 diabetes mellitus without complications: Secondary | ICD-10-CM | POA: Diagnosis present

## 2024-06-17 DIAGNOSIS — K219 Gastro-esophageal reflux disease without esophagitis: Secondary | ICD-10-CM | POA: Diagnosis present

## 2024-06-17 DIAGNOSIS — Z79899 Other long term (current) drug therapy: Secondary | ICD-10-CM

## 2024-06-17 DIAGNOSIS — M75121 Complete rotator cuff tear or rupture of right shoulder, not specified as traumatic: Principal | ICD-10-CM | POA: Diagnosis present

## 2024-06-17 DIAGNOSIS — E785 Hyperlipidemia, unspecified: Secondary | ICD-10-CM | POA: Diagnosis present

## 2024-06-17 DIAGNOSIS — Z86718 Personal history of other venous thrombosis and embolism: Secondary | ICD-10-CM

## 2024-06-17 DIAGNOSIS — Z01818 Encounter for other preprocedural examination: Secondary | ICD-10-CM

## 2024-06-17 DIAGNOSIS — Z96611 Presence of right artificial shoulder joint: Principal | ICD-10-CM

## 2024-06-17 DIAGNOSIS — D649 Anemia, unspecified: Secondary | ICD-10-CM

## 2024-06-17 DIAGNOSIS — Z7901 Long term (current) use of anticoagulants: Secondary | ICD-10-CM

## 2024-06-17 HISTORY — DX: Unspecified asthma, uncomplicated: J45.909

## 2024-06-17 HISTORY — PX: REVERSE SHOULDER ARTHROPLASTY: SHX5054

## 2024-06-17 LAB — GLUCOSE, CAPILLARY
Glucose-Capillary: 198 mg/dL — ABNORMAL HIGH (ref 70–99)
Glucose-Capillary: 267 mg/dL — ABNORMAL HIGH (ref 70–99)
Glucose-Capillary: 223 mg/dL — ABNORMAL HIGH (ref 70–99)
Glucose-Capillary: 161 mg/dL — ABNORMAL HIGH (ref 70–99)
Glucose-Capillary: 183 mg/dL — ABNORMAL HIGH (ref 70–99)

## 2024-06-17 SURGERY — ARTHROPLASTY, SHOULDER, TOTAL, REVERSE
Anesthesia: General | Site: Shoulder | Laterality: Right

## 2024-06-17 MED ORDER — ROCURONIUM BROMIDE 10 MG/ML (PF) SYRINGE
PREFILLED_SYRINGE | INTRAVENOUS | Status: AC
  Filled 2024-06-17: qty 10

## 2024-06-17 MED ORDER — TRANEXAMIC ACID-NACL 1000-0.7 MG/100ML-% IV SOLN
1000.0000 mg | INTRAVENOUS | Status: AC
Start: 2024-06-17 — End: 2024-06-17
  Administered 2024-06-17: 1000 mg via INTRAVENOUS

## 2024-06-17 MED ORDER — ACETAMINOPHEN 500 MG PO TABS
1000.0000 mg | ORAL_TABLET | Freq: Four times a day (QID) | ORAL | Status: AC
  Administered 2024-06-17 – 2024-06-18 (×3): 1000 mg via ORAL
  Filled 2024-06-17 (×3): qty 2

## 2024-06-17 MED ORDER — ACETAMINOPHEN 500 MG PO TABS
ORAL_TABLET | ORAL | Status: AC
  Filled 2024-06-17: qty 2

## 2024-06-17 MED ORDER — ALBUTEROL SULFATE (2.5 MG/3ML) 0.083% IN NEBU
3.0000 mL | INHALATION_SOLUTION | RESPIRATORY_TRACT | Status: DC | PRN
  Administered 2024-06-17 – 2024-06-21 (×2): 3 mL via RESPIRATORY_TRACT
  Filled 2024-06-17 (×2): qty 3

## 2024-06-17 MED ORDER — DEXAMETHASONE SOD PHOSPHATE PF 10 MG/ML IJ SOLN
INTRAMUSCULAR | Status: DC | PRN
  Administered 2024-06-17: 10 mg via INTRAVENOUS

## 2024-06-17 MED ORDER — METOCLOPRAMIDE HCL 10 MG PO TABS: 5.0000 mg | ORAL_TABLET | Freq: Three times a day (TID) | ORAL | Status: DC | PRN

## 2024-06-17 MED ORDER — KETAMINE HCL 50 MG/5ML IJ SOSY
PREFILLED_SYRINGE | INTRAMUSCULAR | Status: DC | PRN
  Administered 2024-06-17: 30 mg via INTRAVENOUS

## 2024-06-17 MED ORDER — ONDANSETRON HCL 4 MG/2ML IJ SOLN: 4.0000 mg | Freq: Four times a day (QID) | INTRAMUSCULAR | Status: DC | PRN

## 2024-06-17 MED ORDER — PROPOFOL 10 MG/ML IV BOLUS
INTRAVENOUS | Status: DC | PRN
  Administered 2024-06-17: 70 mg via INTRAVENOUS
  Administered 2024-06-17: 130 mg via INTRAVENOUS

## 2024-06-17 MED ORDER — ACETAMINOPHEN 10 MG/ML IV SOLN
INTRAVENOUS | Status: DC | PRN
  Administered 2024-06-17: 1000 mg via INTRAVENOUS

## 2024-06-17 MED ORDER — DROPERIDOL 2.5 MG/ML IJ SOLN: 0.6250 mg | Freq: Once | INTRAMUSCULAR | Status: DC | PRN

## 2024-06-17 MED ORDER — KETOROLAC TROMETHAMINE 15 MG/ML IJ SOLN
INTRAMUSCULAR | Status: AC
  Filled 2024-06-17: qty 1

## 2024-06-17 MED ORDER — ORAL CARE MOUTH RINSE: 15.0000 mL | OROMUCOSAL | Status: DC | PRN

## 2024-06-17 MED ORDER — FENTANYL CITRATE (PF) 100 MCG/2ML IJ SOLN
INTRAMUSCULAR | Status: AC
  Filled 2024-06-17: qty 2

## 2024-06-17 MED ORDER — OXYCODONE HCL 5 MG PO TABS
5.0000 mg | ORAL_TABLET | Freq: Once | ORAL | Status: AC | PRN
  Administered 2024-06-17: 5 mg via ORAL

## 2024-06-17 MED ORDER — SODIUM CHLORIDE (PF) 0.9 % IJ SOLN
INTRAMUSCULAR | Status: AC
  Filled 2024-06-17: qty 10

## 2024-06-17 MED ORDER — BUPIVACAINE LIPOSOME 1.3 % IJ SUSP
INTRAMUSCULAR | Status: DC | PRN
  Administered 2024-06-17: 50 mL

## 2024-06-17 MED ORDER — DOCUSATE SODIUM 100 MG PO CAPS
100.0000 mg | ORAL_CAPSULE | Freq: Two times a day (BID) | ORAL | Status: DC
  Administered 2024-06-17 – 2024-06-23 (×12): 100 mg via ORAL
  Filled 2024-06-17 (×12): qty 1

## 2024-06-17 MED ORDER — OXYCODONE HCL 5 MG PO TABS
ORAL_TABLET | ORAL | Status: AC
  Filled 2024-06-17: qty 1

## 2024-06-17 MED ORDER — DIPHENHYDRAMINE HCL 12.5 MG/5ML PO ELIX: 12.5000 mg | ORAL_SOLUTION | ORAL | Status: DC | PRN

## 2024-06-17 MED ORDER — FENTANYL CITRATE (PF) 100 MCG/2ML IJ SOLN
INTRAMUSCULAR | Status: DC | PRN
  Administered 2024-06-17 (×2): 50 ug via INTRAVENOUS

## 2024-06-17 MED ORDER — PREDNISONE 10 MG PO TABS
10.0000 mg | ORAL_TABLET | Freq: Every day | ORAL | Status: DC
  Administered 2024-06-18 – 2024-06-23 (×6): 10 mg via ORAL
  Filled 2024-06-17 (×6): qty 1

## 2024-06-17 MED ORDER — PHENYLEPHRINE HCL-NACL 20-0.9 MG/250ML-% IV SOLN
INTRAVENOUS | Status: DC | PRN
  Administered 2024-06-17: 40 ug/min via INTRAVENOUS

## 2024-06-17 MED ORDER — ORAL CARE MOUTH RINSE: 15.0000 mL | Freq: Once | OROMUCOSAL | Status: AC

## 2024-06-17 MED ORDER — KETOROLAC TROMETHAMINE 15 MG/ML IJ SOLN
15.0000 mg | Freq: Once | INTRAMUSCULAR | Status: AC
  Administered 2024-06-17: 15 mg via INTRAVENOUS

## 2024-06-17 MED ORDER — INSULIN ASPART 100 UNIT/ML IJ SOLN
0.0000 [IU] | Freq: Three times a day (TID) | INTRAMUSCULAR | Status: DC
  Administered 2024-06-17 – 2024-06-18 (×2): 3 [IU] via SUBCUTANEOUS
  Administered 2024-06-18: 5 [IU] via SUBCUTANEOUS
  Administered 2024-06-18: 3 [IU] via SUBCUTANEOUS
  Administered 2024-06-19 – 2024-06-20 (×4): 2 [IU] via SUBCUTANEOUS
  Administered 2024-06-20 (×2): 5 [IU] via SUBCUTANEOUS
  Administered 2024-06-21 (×2): 3 [IU] via SUBCUTANEOUS
  Administered 2024-06-21: 5 [IU] via SUBCUTANEOUS
  Administered 2024-06-22 (×2): 3 [IU] via SUBCUTANEOUS
  Administered 2024-06-22: 5 [IU] via SUBCUTANEOUS
  Administered 2024-06-23: 3 [IU] via SUBCUTANEOUS
  Filled 2024-06-17 (×15): qty 1

## 2024-06-17 MED ORDER — ACETAMINOPHEN 325 MG PO TABS
325.0000 mg | ORAL_TABLET | Freq: Four times a day (QID) | ORAL | Status: DC | PRN
  Administered 2024-06-20 – 2024-06-21 (×4): 650 mg via ORAL
  Filled 2024-06-17 (×4): qty 2

## 2024-06-17 MED ORDER — MIDODRINE HCL 5 MG PO TABS
5.0000 mg | ORAL_TABLET | Freq: Three times a day (TID) | ORAL | Status: DC
  Administered 2024-06-17 – 2024-06-23 (×17): 5 mg via ORAL
  Filled 2024-06-17 (×17): qty 1

## 2024-06-17 MED ORDER — OXYCODONE HCL 5 MG/5ML PO SOLN: 5.0000 mg | Freq: Once | ORAL | Status: AC | PRN

## 2024-06-17 MED ORDER — GUAIFENESIN ER 600 MG PO TB12: 600.0000 mg | ORAL_TABLET | Freq: Two times a day (BID) | ORAL | Status: DC | PRN

## 2024-06-17 MED ORDER — ONDANSETRON HCL 4 MG/2ML IJ SOLN
INTRAMUSCULAR | Status: DC | PRN
  Administered 2024-06-17: 4 mg via INTRAVENOUS

## 2024-06-17 MED ORDER — FENTANYL CITRATE (PF) 100 MCG/2ML IJ SOLN
25.0000 ug | INTRAMUSCULAR | Status: AC | PRN
  Administered 2024-06-17 (×8): 25 ug via INTRAVENOUS

## 2024-06-17 MED ORDER — INSULIN ASPART 100 UNIT/ML IJ SOLN
INTRAMUSCULAR | Status: AC
  Filled 2024-06-17: qty 1

## 2024-06-17 MED ORDER — SODIUM CHLORIDE 0.9 % IV SOLN: INTRAVENOUS | Status: AC

## 2024-06-17 MED ORDER — TAMSULOSIN HCL 0.4 MG PO CAPS
0.4000 mg | ORAL_CAPSULE | Freq: Every day | ORAL | Status: DC
  Administered 2024-06-17 – 2024-06-22 (×6): 0.4 mg via ORAL
  Filled 2024-06-17 (×6): qty 1

## 2024-06-17 MED ORDER — SODIUM CHLORIDE (PF) 0.9 % IJ SOLN
INTRAMUSCULAR | Status: AC
  Filled 2024-06-17: qty 20

## 2024-06-17 MED ORDER — HYDROMORPHONE HCL 1 MG/ML IJ SOLN
INTRAMUSCULAR | Status: AC
  Filled 2024-06-17: qty 1

## 2024-06-17 MED ORDER — SUGAMMADEX SODIUM 200 MG/2ML IV SOLN
INTRAVENOUS | Status: DC | PRN
  Administered 2024-06-17: 150 mg via INTRAVENOUS

## 2024-06-17 MED ORDER — HYDROMORPHONE HCL 1 MG/ML IJ SOLN
0.5000 mg | INTRAMUSCULAR | Status: AC | PRN
  Administered 2024-06-17 (×4): 0.5 mg via INTRAVENOUS

## 2024-06-17 MED ORDER — MAGNESIUM HYDROXIDE 400 MG/5ML PO SUSP
30.0000 mL | Freq: Every day | ORAL | Status: DC | PRN
  Administered 2024-06-19 – 2024-06-23 (×3): 30 mL via ORAL
  Filled 2024-06-17 (×3): qty 30

## 2024-06-17 MED ORDER — OXYBUTYNIN CHLORIDE 5 MG PO TABS
5.0000 mg | ORAL_TABLET | Freq: Three times a day (TID) | ORAL | Status: DC | PRN
Start: 2024-06-17 — End: 2024-06-23

## 2024-06-17 MED ORDER — ACETAMINOPHEN 10 MG/ML IV SOLN
INTRAVENOUS | Status: AC
  Filled 2024-06-17: qty 100

## 2024-06-17 MED ORDER — ALBUTEROL SULFATE HFA 108 (90 BASE) MCG/ACT IN AERS
INHALATION_SPRAY | RESPIRATORY_TRACT | Status: DC | PRN
  Administered 2024-06-17 (×2): 6 via RESPIRATORY_TRACT

## 2024-06-17 MED ORDER — SODIUM CHLORIDE 0.9 % IR SOLN
Status: DC | PRN
  Administered 2024-06-17: 3000 mL

## 2024-06-17 MED ORDER — NICOTINE 14 MG/24HR TD PT24
14.0000 mg | MEDICATED_PATCH | Freq: Every day | TRANSDERMAL | Status: DC
  Administered 2024-06-17 – 2024-06-23 (×7): 14 mg via TRANSDERMAL
  Filled 2024-06-17 (×7): qty 1

## 2024-06-17 MED ORDER — OXYCODONE HCL 5 MG PO TABS
5.0000 mg | ORAL_TABLET | ORAL | Status: DC | PRN
  Administered 2024-06-17 (×2): 5 mg via ORAL
  Administered 2024-06-18 – 2024-06-19 (×5): 10 mg via ORAL
  Administered 2024-06-20: 5 mg via ORAL
  Administered 2024-06-21 – 2024-06-23 (×10): 10 mg via ORAL
  Filled 2024-06-17: qty 1
  Filled 2024-06-17 (×7): qty 2
  Filled 2024-06-17: qty 1
  Filled 2024-06-17 (×8): qty 2

## 2024-06-17 MED ORDER — TRANEXAMIC ACID-NACL 1000-0.7 MG/100ML-% IV SOLN
INTRAVENOUS | Status: AC
  Filled 2024-06-17: qty 100

## 2024-06-17 MED ORDER — ADULT MULTIVITAMIN W/MINERALS CH
1.0000 | ORAL_TABLET | Freq: Every day | ORAL | Status: DC
  Administered 2024-06-18 – 2024-06-23 (×6): 1 via ORAL
  Filled 2024-06-17 (×6): qty 1

## 2024-06-17 MED ORDER — BUPIVACAINE-EPINEPHRINE (PF) 0.5% -1:200000 IJ SOLN: INTRAMUSCULAR | Status: DC | PRN

## 2024-06-17 MED ORDER — BUPIVACAINE-EPINEPHRINE (PF) 0.5% -1:200000 IJ SOLN
INTRAMUSCULAR | Status: AC
  Filled 2024-06-17: qty 10

## 2024-06-17 MED ORDER — GABAPENTIN 300 MG PO CAPS
300.0000 mg | ORAL_CAPSULE | Freq: Three times a day (TID) | ORAL | Status: DC
Start: 2024-06-17 — End: 2024-06-23
  Administered 2024-06-17 – 2024-06-23 (×17): 300 mg via ORAL
  Filled 2024-06-17 (×17): qty 1

## 2024-06-17 MED ORDER — MIDAZOLAM HCL 2 MG/2ML IJ SOLN
INTRAMUSCULAR | Status: DC | PRN
  Administered 2024-06-17: 2 mg via INTRAVENOUS

## 2024-06-17 MED ORDER — CHLORHEXIDINE GLUCONATE 0.12 % MT SOLN
OROMUCOSAL | Status: AC
  Filled 2024-06-17: qty 15

## 2024-06-17 MED ORDER — HYDROMORPHONE HCL 1 MG/ML IJ SOLN
0.2500 mg | INTRAMUSCULAR | Status: DC | PRN
  Administered 2024-06-19 (×2): 0.5 mg via INTRAVENOUS
  Administered 2024-06-19: 0.25 mg via INTRAVENOUS
  Administered 2024-06-19 – 2024-06-22 (×7): 0.5 mg via INTRAVENOUS
  Administered 2024-06-22: 0.25 mg via INTRAVENOUS
  Filled 2024-06-17 (×11): qty 0.5

## 2024-06-17 MED ORDER — CEFAZOLIN SODIUM-DEXTROSE 2-4 GM/100ML-% IV SOLN
2.0000 g | INTRAVENOUS | Status: AC
  Administered 2024-06-17: 2 g via INTRAVENOUS

## 2024-06-17 MED ORDER — KETOROLAC TROMETHAMINE 15 MG/ML IJ SOLN
7.5000 mg | Freq: Four times a day (QID) | INTRAMUSCULAR | Status: AC
  Administered 2024-06-17 – 2024-06-18 (×4): 7.5 mg via INTRAVENOUS
  Filled 2024-06-17 (×3): qty 1

## 2024-06-17 MED ORDER — ONDANSETRON HCL 4 MG PO TABS: 4.0000 mg | ORAL_TABLET | Freq: Four times a day (QID) | ORAL | Status: DC | PRN

## 2024-06-17 MED ORDER — APIXABAN 2.5 MG PO TABS
5.0000 mg | ORAL_TABLET | Freq: Two times a day (BID) | ORAL | Status: DC
  Administered 2024-06-18 – 2024-06-23 (×11): 5 mg via ORAL
  Filled 2024-06-17 (×11): qty 2

## 2024-06-17 MED ORDER — OMEPRAZOLE MAGNESIUM 20 MG PO TBEC: 20.0000 mg | DELAYED_RELEASE_TABLET | ORAL | Status: DC

## 2024-06-17 MED ORDER — ROCURONIUM BROMIDE 100 MG/10ML IV SOLN
INTRAVENOUS | Status: DC | PRN
  Administered 2024-06-17 (×2): 20 mg via INTRAVENOUS
  Administered 2024-06-17: 50 mg via INTRAVENOUS

## 2024-06-17 MED ORDER — METOCLOPRAMIDE HCL 5 MG/ML IJ SOLN: 5.0000 mg | Freq: Three times a day (TID) | INTRAMUSCULAR | Status: DC | PRN

## 2024-06-17 MED ORDER — PANTOPRAZOLE SODIUM 40 MG PO TBEC
40.0000 mg | DELAYED_RELEASE_TABLET | Freq: Every day | ORAL | Status: DC
Start: 2024-06-17 — End: 2024-06-23
  Administered 2024-06-17 – 2024-06-23 (×7): 40 mg via ORAL
  Filled 2024-06-17 (×7): qty 1

## 2024-06-17 MED ORDER — FLEET ENEMA RE ENEM: 1.0000 | ENEMA | Freq: Once | RECTAL | Status: DC | PRN

## 2024-06-17 MED ORDER — PHENYLEPHRINE HCL-NACL 20-0.9 MG/250ML-% IV SOLN
INTRAVENOUS | Status: AC
  Filled 2024-06-17: qty 250

## 2024-06-17 MED ORDER — MIDAZOLAM HCL 2 MG/2ML IJ SOLN
INTRAMUSCULAR | Status: AC
  Filled 2024-06-17: qty 2

## 2024-06-17 MED ORDER — BUPIVACAINE LIPOSOME 1.3 % IJ SUSP
INTRAMUSCULAR | Status: AC
  Filled 2024-06-17: qty 20

## 2024-06-17 MED ORDER — BUPIVACAINE-EPINEPHRINE (PF) 0.5% -1:200000 IJ SOLN
INTRAMUSCULAR | Status: AC
  Filled 2024-06-17: qty 30

## 2024-06-17 MED ORDER — BUDESON-GLYCOPYRROL-FORMOTEROL 160-9-4.8 MCG/ACT IN AERO
2.0000 | INHALATION_SPRAY | Freq: Two times a day (BID) | RESPIRATORY_TRACT | Status: DC
  Administered 2024-06-17 – 2024-06-23 (×12): 2 via RESPIRATORY_TRACT
  Filled 2024-06-17: qty 5.9

## 2024-06-17 MED ORDER — INSULIN ASPART 100 UNIT/ML IJ SOLN
10.0000 [IU] | Freq: Once | INTRAMUSCULAR | Status: AC
  Administered 2024-06-17: 10 [IU] via SUBCUTANEOUS

## 2024-06-17 MED ORDER — CEFAZOLIN SODIUM-DEXTROSE 2-4 GM/100ML-% IV SOLN
2.0000 g | Freq: Four times a day (QID) | INTRAVENOUS | Status: AC
  Administered 2024-06-17 (×2): 2 g via INTRAVENOUS
  Filled 2024-06-17: qty 100

## 2024-06-17 MED ORDER — CEFAZOLIN SODIUM-DEXTROSE 2-4 GM/100ML-% IV SOLN
INTRAVENOUS | Status: AC
  Filled 2024-06-17: qty 100

## 2024-06-17 MED ORDER — BISACODYL 10 MG RE SUPP: 10.0000 mg | Freq: Every day | RECTAL | Status: DC | PRN

## 2024-06-17 MED ORDER — SODIUM CHLORIDE 0.9 % IV SOLN: INTRAVENOUS | Status: DC

## 2024-06-17 MED ORDER — ALBUTEROL SULFATE HFA 108 (90 BASE) MCG/ACT IN AERS
INHALATION_SPRAY | RESPIRATORY_TRACT | Status: AC
  Filled 2024-06-17: qty 6.7

## 2024-06-17 MED ORDER — CHLORHEXIDINE GLUCONATE 0.12 % MT SOLN
15.0000 mL | Freq: Once | OROMUCOSAL | Status: AC
  Administered 2024-06-17: 15 mL via OROMUCOSAL

## 2024-06-17 MED ORDER — ACETAMINOPHEN 10 MG/ML IV SOLN: 1000.0000 mg | Freq: Once | INTRAVENOUS | Status: DC | PRN

## 2024-06-17 MED ORDER — KETAMINE HCL 50 MG/5ML IJ SOSY
PREFILLED_SYRINGE | INTRAMUSCULAR | Status: AC
  Filled 2024-06-17: qty 5

## 2024-06-17 MED ORDER — PROPOFOL 10 MG/ML IV BOLUS
INTRAVENOUS | Status: AC
  Filled 2024-06-17: qty 20

## 2024-06-17 SURGICAL SUPPLY — 65 items
BASEPLATE BOSS DRILL (MISCELLANEOUS) IMPLANT
BIT DRILL F/BASEPLATE CENTRAL (BIT) IMPLANT
BIT DRILL FLUTED 3.0 STRL (BIT) IMPLANT
BLADE SAW SAG 25X90X1.19 (BLADE) ×1 IMPLANT
CHLORAPREP W/TINT 26 (MISCELLANEOUS) ×1 IMPLANT
COOLER ICEMAN CLASSIC (MISCELLANEOUS) ×1 IMPLANT
CUP SUT UNIV REVERS 39 NEU (Shoulder) IMPLANT
DRAPE INCISE IOBAN 66X45 STRL (DRAPES) ×1 IMPLANT
DRAPE SHEET LG 3/4 BI-LAMINATE (DRAPES) ×1 IMPLANT
DRAPE TABLE BACK 80X90 (DRAPES) ×1 IMPLANT
DRSG OPSITE POSTOP 4X6 (GAUZE/BANDAGES/DRESSINGS) IMPLANT
DRSG OPSITE POSTOP 4X8 (GAUZE/BANDAGES/DRESSINGS) ×1 IMPLANT
ELECT CAUTERY BLADE 6.4 (BLADE) ×1 IMPLANT
ELECTRODE BLDE 4.0 EZ CLN MEGD (MISCELLANEOUS) ×1 IMPLANT
ELECTRODE REM PT RTRN 9FT ADLT (ELECTROSURGICAL) ×1 IMPLANT
GAUZE XEROFORM 1X8 LF (GAUZE/BANDAGES/DRESSINGS) ×1 IMPLANT
GLENOID UNI REV MOD 24 +2 LAT (Joint) IMPLANT
GLENOSPHERE 39+4 LAT/24 UNI RV (Joint) IMPLANT
GLOVE BIO SURGEON STRL SZ7.5 (GLOVE) ×4 IMPLANT
GLOVE BIO SURGEON STRL SZ8 (GLOVE) ×4 IMPLANT
GLOVE BIOGEL PI IND STRL 8 (GLOVE) ×2 IMPLANT
GLOVE INDICATOR 8.0 STRL GRN (GLOVE) ×1 IMPLANT
GOWN STRL REUS W/ TWL LRG LVL3 (GOWN DISPOSABLE) ×2 IMPLANT
GOWN STRL REUS W/ TWL XL LVL3 (GOWN DISPOSABLE) ×1 IMPLANT
GUIDE PIN 2.0 X 150 (WIRE) IMPLANT
HANDLE YANKAUER SUCT OPEN TIP (MISCELLANEOUS) ×1 IMPLANT
HOOD PEEL AWAY T7 (MISCELLANEOUS) ×3 IMPLANT
INSERT HUMERAL MED 39/ +3 (Shoulder) IMPLANT
KIT STABILIZATION SHOULDER (MISCELLANEOUS) ×1 IMPLANT
KIT TURNOVER KIT A (KITS) ×1 IMPLANT
MANIFOLD NEPTUNE II (INSTRUMENTS) ×1 IMPLANT
MASK FACE SPIDER DISP (MASK) ×1 IMPLANT
MAT ABSORB FLUID 56X50 GRAY (MISCELLANEOUS) ×1 IMPLANT
NDL MAYO CATGUT SZ1 (NEEDLE) IMPLANT
NDL SAFETY ECLIP 18X1.5 (MISCELLANEOUS) ×1 IMPLANT
NDL SPNL 20GX3.5 QUINCKE YW (NEEDLE) ×1 IMPLANT
NEEDLE MAYO CATGUT SZ1 (NEEDLE) IMPLANT
NEEDLE SPNL 20GX3.5 QUINCKE YW (NEEDLE) ×1 IMPLANT
PACK ARTHROSCOPY SHOULDER (MISCELLANEOUS) ×1 IMPLANT
PAD ARMBOARD POSITIONER FOAM (MISCELLANEOUS) ×1 IMPLANT
PAD COLD SHLDR SM WRAP-ON (PAD) ×1 IMPLANT
PENCIL SMOKE EVACUATOR (MISCELLANEOUS) ×1 IMPLANT
PIN NITINOL TARGETER 2.8 (PIN) IMPLANT
PIN SET MODULAR GLENOID SYSTEM (PIN) IMPLANT
SCREW CENTRAL MOD 30MM (Screw) IMPLANT
SCREW PERI LOCK 5.5X24 (Screw) IMPLANT
SCREW PERIPHERAL 5.5X20 LOCK (Screw) IMPLANT
SCREW PERIPHERAL 5.5X28 LOCK (Screw) IMPLANT
SCREW PERIPHERAL 5.5X40 LOCK (Screw) IMPLANT
SLING ULTRA II LG (MISCELLANEOUS) IMPLANT
SLING ULTRA II M (MISCELLANEOUS) IMPLANT
SOL .9 NS 3000ML IRR UROMATIC (IV SOLUTION) ×1 IMPLANT
SPONGE T-LAP 18X18 ~~LOC~~+RFID (SPONGE) ×2 IMPLANT
STAPLER SKIN PROX 35W (STAPLE) ×1 IMPLANT
STEM HUM UNIV REV SZ11 (Stem) IMPLANT
SUT ETHIBOND 0 MO6 C/R (SUTURE) ×1 IMPLANT
SUT VIC AB 0 CT1 36 (SUTURE) ×1 IMPLANT
SUT VIC AB 2-0 CT1 TAPERPNT 27 (SUTURE) ×2 IMPLANT
SUTURE FIBERWR #2 38 BLUE 1/2 (SUTURE) ×3 IMPLANT
SYR 10ML LL (SYRINGE) ×1 IMPLANT
SYR 30ML LL (SYRINGE) ×1 IMPLANT
SYR TOOMEY 50ML (SYRINGE) ×1 IMPLANT
TIP FAN IRRIG PULSAVAC PLUS (DISPOSABLE) ×1 IMPLANT
TRAP FLUID SMOKE EVACUATOR (MISCELLANEOUS) ×1 IMPLANT
WATER STERILE IRR 500ML POUR (IV SOLUTION) ×1 IMPLANT

## 2024-06-17 NOTE — Progress Notes (Signed)
 Spoke with Dr. Edie and MD gave order to check pulse oximetry q4H since patient's oxygen levels have been >92% this afternoon as reflected in vital signs obtained.

## 2024-06-17 NOTE — Anesthesia Procedure Notes (Signed)
 Procedure Name: Intubation Date/Time: 06/17/2024 7:44 AM  Performed by: Lorrene Camelia LABOR, CRNAPre-anesthesia Checklist: Patient identified, Patient being monitored, Timeout performed, Emergency Drugs available and Suction available Patient Re-evaluated:Patient Re-evaluated prior to induction Oxygen Delivery Method: Circle system utilized Preoxygenation: Pre-oxygenation with 100% oxygen Induction Type: IV induction Ventilation: Mask ventilation without difficulty Laryngoscope Size: 3 and McGrath Grade View: Grade I Tube type: Oral Tube size: 7.0 mm Number of attempts: 1 Airway Equipment and Method: Stylet Placement Confirmation: ETT inserted through vocal cords under direct vision, positive ETCO2 and breath sounds checked- equal and bilateral Secured at: 23 cm Tube secured with: Tape Dental Injury: Teeth and Oropharynx as per pre-operative assessment

## 2024-06-17 NOTE — Transfer of Care (Signed)
 Immediate Anesthesia Transfer of Care Note  [name]  Procedure(s) Performed: ARTHROPLASTY, SHOULDER, TOTAL, REVERSE (Right: Shoulder)  Patient Location: PACU  Anesthesia Type:General  Level of Consciousness: awake and patient cooperative  Airway & Oxygen Therapy: Patient Spontanous Breathing and Patient connected to face mask oxygen  Post-op Assessment: Report given to RN and Post -op Vital signs reviewed and stable  Post vital signs: Reviewed and stable  Last Vitals:  Vitals Value Taken Time  BP 132/92 06/17/24 10:26  Temp 36.2 C 06/17/24 10:26  Pulse 97 06/17/24 10:26  Resp 18 06/17/24 10:26  SpO2 100 % 06/17/24 10:26  Vitals shown include unfiled device data.  Last Pain:  Vitals:   06/17/24 0615  TempSrc: Temporal  PainSc: 0-No pain         Complications: No notable events documented.

## 2024-06-17 NOTE — Evaluation (Signed)
 Physical Therapy Evaluation Patient Details Name: Benjamin Bolton MRN: [id number] DOB: Jun 30, 1961 Today's Date: 06/17/2024  History of Present Illness  Pt is a 64 year old male s/p R Reverse right total shoulder arthroplasty.    PMH significant for  severe COPD and asthma diagnosis.  Clinical Impression  Pt A&Ox4, pleasant and agreeable to PT evaluation. At baseline, pt reports no AD use, limited household ambulator, denies hx of falls. Pt was met semi-supine in bed, able to complete bed mobility with supervision and inc time/effort. STS from EOB with CGA. Pt amb ~48ft with no AD, no overt LOB however demonstrated instances of bilateral knee instability in stance with mild degree of path variance. Pt began to report feeling SOB with increased ambulation distance. HR and SpO2 monitored closely during session, pt on 4L [place] initially and throughout mobility and was left on 3L at end of session with HR and SpO2 WFL throughout session. Pt was left semi-supine in bed, all needs in reach. Pt would benefit from skilled PT intervention to address listed deficits (see PT Problem List) and allow for safe return to PLOF.       If plan is discharge home, recommend the following: A little help with walking and/or transfers;A lot of help with bathing/dressing/bathroom;Assistance with cooking/housework;Assist for transportation;Help with stairs or ramp for entrance   Can travel by private vehicle   Yes    Equipment Recommendations Other (comment) (TBD at next venue of care)  Recommendations for Other Services       Functional Status Assessment Patient has had a recent decline in their functional status and demonstrates the ability to make significant improvements in function in a reasonable and predictable amount of time.     Precautions / Restrictions Precautions Precautions: Fall Recall of Precautions/Restrictions: Intact Required Braces or Orthoses: Sling Restrictions Weight Bearing Restrictions  Per Provider Order: Yes RUE Weight Bearing Per Provider Order: Non weight bearing      Mobility  Bed Mobility Overal bed mobility: Needs Assistance Bed Mobility: Supine to Sit, Sit to Supine     Supine to sit: Supervision, HOB elevated Sit to supine: Supervision, HOB elevated   General bed mobility comments: no physical assistance required, inc time/effort    Transfers Overall transfer level: Needs assistance Equipment used: None Transfers: Sit to/from Stand Sit to Stand: Contact guard assist           General transfer comment: CGA to stand from EOB    Ambulation/Gait Ambulation/Gait assistance: Contact guard assist Gait Distance (Feet): 80 Feet Assistive device: None Gait Pattern/deviations: Step-through pattern, Decreased stride length, Narrow base of support Gait velocity: decreased     General Gait Details: no LOB, pt began to feel SOB with increased distance, mild knee instability in stance but no overt LOB throughout  Stairs            Wheelchair Mobility     Tilt Bed    Modified Rankin (Stroke Patients Only)       Balance Overall balance assessment: Needs assistance Sitting-balance support: Feet supported Sitting balance-Leahy Scale: Good Sitting balance - Comments: steady static and dynamic sitting   Standing balance support: Single extremity supported Standing balance-Leahy Scale: Fair Standing balance comment: mild increased postural sway in standing                             Pertinent Vitals/Pain Pain Assessment Pain Assessment: 0-10 Pain Score: 7  Pain Location: R shoulder  Pain Descriptors / Indicators: Discomfort, Aching Pain Intervention(s): Monitored during session, Repositioned, Ice applied    Home Living Family/patient expects to be discharged to:: Private residence Living Arrangements: Other relatives (brother and sister in law) Available Help at Discharge: Family;Available PRN/intermittently Type of Home:  House Home Access: Stairs to enter Entrance Stairs-Rails: Left Entrance Stairs-Number of Steps: 4   Home Layout: One level Home Equipment: BSC/3in1;Electric scooter Additional Comments: pt reports that his brother and sister in law are not able to help him. Sister helps drive him sometimes    Prior Function Prior Level of Function : Independent/Modified Independent             Mobility Comments: Pt reports limited household distance amb, no AD, denies hx of falls ADLs Comments: Pt sponge bathes at baseline, usually IND with most other ADLs. On 3L supplemental O2 at all times at baseline.     Extremity/Trunk Assessment   Upper Extremity Assessment Upper Extremity Assessment: Right hand dominant;Defer to OT evaluation RUE Deficits / Details: AROM: shoulder NT, elbow flexion extension approx 1/2 full AROM, wrist flexion/extension 3/4 full AROM, hand WFL; PROM grossly WFL, shoulder NT RUE Sensation: WNL RUE Coordination: WNL    Lower Extremity Assessment Lower Extremity Assessment: Generalized weakness       Communication   Communication Communication: No apparent difficulties    Cognition Arousal: Alert Behavior During Therapy: WFL for tasks assessed/performed   PT - Cognitive impairments: No apparent impairments                       PT - Cognition Comments: A&Ox4, pleasant Following commands: Intact       Cueing Cueing Techniques: Verbal cues, Visual cues     General Comments General comments (skin integrity, edema, etc.): pt bumped up to 4L per his request, spo2 >90% throughout    Exercises Other Exercises Other Exercises: Pt on 4L [place] at start of session, requested to maintain on 4L for mobility. During amb SpO2 94% HR high 90s to low 100s. Returning to sitting SpO2 95% HR in mid to high 80s. Pt returned to 3L per pt request at end of session, HR 75 bpm SpO2 98%.   Assessment/Plan    PT Assessment Patient needs continued PT services  PT Problem  List Decreased strength;Decreased activity tolerance;Decreased balance;Decreased mobility;Decreased knowledge of use of DME;Cardiopulmonary status limiting activity       PT Treatment Interventions DME instruction;Gait training;Stair training;Functional mobility training;Therapeutic activities;Therapeutic exercise;Balance training;Neuromuscular re-education;Patient/family education    PT Goals (Current goals can be found in the Care Plan section)  Acute Rehab PT Goals Patient Stated Goal: to get better PT Goal Formulation: With patient Time For Goal Achievement: 07/01/24 Potential to Achieve Goals: Fair    Frequency BID     Co-evaluation               AM-PAC PT 6 Clicks Mobility  Outcome Measure Help needed turning from your back to your side while in a flat bed without using bedrails?: A Little Help needed moving from lying on your back to sitting on the side of a flat bed without using bedrails?: A Little Help needed moving to and from a bed to a chair (including a wheelchair)?: A Little Help needed standing up from a chair using your arms (e.g., wheelchair or bedside chair)?: A Little Help needed to walk in hospital room?: A Little Help needed climbing 3-5 steps with a railing? : A Lot 6 Click Score:  17    End of Session Equipment Utilized During Treatment: Gait belt;Oxygen Activity Tolerance: Patient tolerated treatment well Patient left: in bed;with call bell/phone within reach;with bed alarm set;with SCD's reapplied Nurse Communication: Mobility status PT Visit Diagnosis: Unsteadiness on feet (R26.81);Other abnormalities of gait and mobility (R26.89);Muscle weakness (generalized) (M62.81)    Time: 8378-8357 PT Time Calculation (min) (ACUTE ONLY): 21 min   Charges:   PT Evaluation $PT Eval Moderate Complexity: 1 Mod   PT General Charges $$ ACUTE PT VISIT: 1 Visit         Benjamin Bolton, SPT

## 2024-06-17 NOTE — Evaluation (Signed)
 Occupational Therapy Evaluation Patient Details Name: Benjamin Bolton MRN: [id number] DOB: 1961-03-26 Today's Date: 06/17/2024   History of Present Illness   Pt is a 63 year old male s/p R Reverse right total shoulder arthroplasty.    PMH significant for  severe COPD and asthma diagnosis.  He is on supplemental oxygen at 3L/[place] [employer].     Clinical Impressions Chart reviewed to date, pt greeted semi supine in bed, alert and oriented x4, agreeable to OT evaluation. PTA pt reports he performs ADL with MOD I but with difficulty, sponge bathes at baseline. Pt reports he furniture surfs household distances and does not use device due to his O2. Pt provided education re:  polar care mgt, sling/immobilizer mgt, ROM exercises for RUE, RUE precautions, adaptive strategies for bathing/dressing/toileting/grooming, positioning and considerations for sleep, and home/routines modifications to maximize falls prevention, safety, and independence. Handout provided. OT adjusted sling/immobilizer and polar care to improve comfort, optimize positioning, and to maximize skin integrity/safety. Pt will benefit from skilled OT to address functional deficits to facilitate optimal ADL/functional mobility performance. Pt is performing ADL/functional mobility below PLOF, will benefit from acute OT to address functional deficits. OT will continue to follow acutely.      If plan is discharge home, recommend the following:   A little help with walking and/or transfers;A lot of help with bathing/dressing/bathroom     Functional Status Assessment   Patient has had a recent decline in their functional status and demonstrates the ability to make significant improvements in function in a reasonable and predictable amount of time.     Equipment Recommendations   Tub/shower seat     Recommendations for Other Services         Precautions/Restrictions   Precautions Precautions: Fall Recall of  Precautions/Restrictions: Intact Required Braces or Orthoses: Sling Restrictions Weight Bearing Restrictions Per Provider Order: Yes RUE Weight Bearing Per Provider Order: Non weight bearing     Mobility Bed Mobility Overal bed mobility: Needs Assistance Bed Mobility: Supine to Sit, Sit to Supine     Supine to sit: Supervision, HOB elevated Sit to supine: Supervision, HOB elevated        Transfers Overall transfer level: Needs assistance   Transfers: Sit to/from Stand Sit to Stand: Min assist                  Balance Overall balance assessment: Needs assistance Sitting-balance support: Feet supported Sitting balance-Leahy Scale: Good     Standing balance support: Single extremity supported Standing balance-Leahy Scale: Fair                             ADL either performed or assessed with clinical judgement   ADL Overall ADL's : Needs assistance/impaired Eating/Feeding: Set up   Grooming: Set up;Sitting           Upper Body Dressing : Maximal assistance Upper Body Dressing Details (indicate cue type and reason): doffing/donning gown, polar care, sling Lower Body Dressing: Maximal assistance   Toilet Transfer: Minimal assistance;Ambulation Toilet Transfer Details (indicate cue type and reason): simulated Toileting- Clothing Manipulation and Hygiene: Minimal assistance Toileting - Clothing Manipulation Details (indicate cue type and reason): urinal in sitting     Functional mobility during ADLs: Minimal assistance (approx 100' with HHA)       Vision Patient Visual Report: No change from baseline       Perception         Praxis  Pertinent Vitals/Pain Pain Assessment Pain Assessment: 0-10 Pain Score: 9  Pain Location: R shoulder after mobility Pain Descriptors / Indicators: Discomfort, Aching Pain Intervention(s): Repositioned, Monitored during session, Limited activity within patient's tolerance (notified RN)      Extremity/Trunk Assessment Upper Extremity Assessment Upper Extremity Assessment: Right hand dominant;RUE deficits/detail (LUE grossly WFL) RUE Deficits / Details: AROM: shoulder NT, elbow flexion extension approx 1/2 full AROM, wrist flexion/extension 3/4 full AROM, hand WFL; PROM grossly WFL, shoulder NT RUE Sensation: WNL RUE Coordination: WNL   Lower Extremity Assessment Lower Extremity Assessment: Generalized weakness       Communication Communication Communication: No apparent difficulties   Cognition Arousal: Alert Behavior During Therapy: WFL for tasks assessed/performed Cognition: No apparent impairments                               Following commands: Intact       Cueing  General Comments   Cueing Techniques: Verbal cues  pt bumped up to 4L per his request, spo2 >90% throughout   Exercises Other Exercises Other Exercises: edu re role of OT, role of rehab, discharge recommendations   Shoulder Instructions      Home Living Family/patient expects to be discharged to:: Private residence Living Arrangements: Other relatives Available Help at Discharge: Family (brother/sister in Social worker) Type of Home: House Home Access: Stairs to enter Secretary/administrator of Steps: 4   Home Layout: One level     Bathroom Shower/Tub: Dietitian: BSC/3in1          Prior Functioning/Environment               Mobility Comments: pt reports limited household mobility amb with no AD, near misses but no falls ADLs Comments: pt reports MOD I-I for ADL but difficulties, reports he sponge bathes at baseline bc he gets SOB in shower; PRN assist for IADL    OT Problem List: Decreased strength;Decreased activity tolerance;Impaired balance (sitting and/or standing);Decreased knowledge of use of DME or AE;Cardiopulmonary status limiting activity   OT Treatment/Interventions: Self-care/ADL training;Therapeutic  exercise;Therapeutic activities;Energy conservation;DME and/or AE instruction;Patient/family education;Manual therapy;Balance training      OT Goals(Current goals can be found in the care plan section)   Acute Rehab OT Goals Patient Stated Goal: go home OT Goal Formulation: With patient Time For Goal Achievement: 07/01/24 Potential to Achieve Goals: Good ADL Goals Pt Will Perform Grooming: sitting;with modified independence Pt Will Perform Upper Body Dressing: with modified independence;sitting Pt Will Perform Lower Body Dressing: with modified independence;sitting/lateral leans;sit to/from stand Pt Will Transfer to Toilet: with modified independence;ambulating Pt Will Perform Toileting - Clothing Manipulation and hygiene: with modified independence;sitting/lateral leans;sit to/from stand   OT Frequency:  Min 3X/week    Co-evaluation              AM-PAC OT 6 Clicks Daily Activity     Outcome Measure Help from another person eating meals?: None Help from another person taking care of personal grooming?: None Help from another person toileting, which includes using toliet, bedpan, or urinal?: A Lot Help from another person bathing (including washing, rinsing, drying)?: A Lot Help from another person to put on and taking off regular upper body clothing?: A Lot Help from another person to put on and taking off regular lower body clothing?: A Lot 6 Click Score: 16   End of Session Nurse Communication: Mobility status  Activity Tolerance: Patient tolerated treatment well Patient left: in bed;with call bell/phone within reach  OT Visit Diagnosis: Other abnormalities of gait and mobility (R26.89)                Time: 8549-8486 OT Time Calculation (min): 23 min Charges:  OT General Charges $OT Visit: 1 Visit OT Evaluation $OT Eval Moderate Complexity: 1 Mod  Therisa Sheffield, OTD OTR/L  06/17/24, 3:55 PM

## 2024-06-17 NOTE — H&P (Signed)
 History of Present Illness:  Benjamin Bolton is a 63 y.o. male who presents for follow-up now 1 year status post a limited arthroscopic debridement, arthroscopic subacromial decompression, mini-open rotator cuff repair augmented with Claudene & Nephew Regeneten patch, and mini-open biceps tenodesis of his right shoulder. The patient notes continued pain in his shoulder which he rates at 7/10 on today's visit. He saw Gustavo Level, PA-C, 1 month ago who obtained plain radiographs and sent him for an MRI scan as he was concerned about a recurrent rotator cuff tear. The MRI scan demonstrated evidence of a recurrent rotator cuff tear with an area of avascular porosis involving the superior portion of the humeral head, and has referred the patient to me for further evaluation and treatment. The patient notes constant pain in the shoulder and has difficulty raising his arm even to shoulder level. He has been taking Neurontin  and Tylenol  on a daily basis, as well as oxycodone  as necessary with limited benefit. He also has difficulty reaching behind his back as well as trying to sleep on his right side at night. He denies any reinjury to the shoulder, and denies any fevers or chills. He also denies any numbness or paresthesias down his arm to his hand.  Current Outpatient Medications:  acetaminophen  (TYLENOL ) 325 MG tablet Take 650 mg by mouth every 6 (six) hours as needed for Pain  albuterol  (PROVENTIL ) 2.5 mg /3 mL (0.083 %) nebulizer solution Take 3 mLs (2.5 mg total) by nebulization every 6 (six) hours as needed for Wheezing 1080 mL 1  albuterol  90 mcg/actuation inhaler Inhale 2 inhalations into the lungs every 6 (six) hours as needed for Wheezing 54 g 3  apixaban  (ELIQUIS ) 5 mg tablet Take 1 tablet (5 mg total) by mouth every 12 (twelve) hours for 30 days 60 tablet 2  ascorbic acid , vitamin C , (VITAMIN C ) 500 MG tablet Take 500 mg by mouth 2 (two) times daily  azithromycin  (ZITHROMAX ) 250 MG tablet Take 250 mg by  mouth (Patient not taking: Reported on 07/23/2023)  benralizumab (FASENRA PEN) 30 mg/mL AtIn Inject 30 mg subcutaneously as directed 1 mL 7  butalbital-acetaminophen -caff 50-300-40 mg Cap TAKE ONE CAPSULE BY MOUTH EVERY 4 TO 6 HOURS AS NEEDED FOR PAIN  cetirizine HCl (CETIRIZINE ORAL) Take 10 mg by mouth once daily (Patient not taking: Reported on 07/23/2023)  docusate (COLACE) 100 MG capsule Take 100 mg by mouth as needed  EPINEPHrine  (EPIPEN ) 0.3 mg/0.3 mL auto-injector Inject 0.3 mg into the muscle once as needed for Anaphylaxis  fluticasone -umeclidinium-vilanterol (TRELEGY ELLIPTA) 100-62.5-25 mcg inhaler Inhale 1 Puff into the lungs once daily 60 each 6  gabapentin  (NEURONTIN ) 300 MG capsule Take 300 mg by mouth 3 (three) times daily  guaiFENesin  (MUCINEX ) 600 mg SR tablet Take 600 mg by mouth every 12 (twelve) hours as needed for Congestion  guaifenesin /dextromethorphan  (MUCUS DM MAX ER ORAL) Take 600 mg by mouth once daily  HELIUM-OXYGEN INHAL Inhale 3 L into the lungs  insulin  ASPART (NOVOLOG  U-100 INSULIN  ASPART) injection (concentration 100 units/mL) Inject 2 Units subcutaneously Daily after breakfast 200-250 2 units 251-300 3 units 301- 350 4 units 351-400 6 units 401 - 450 10 units 10 mL 1  ipratropium-albuteroL  (DUO-NEB) nebulizer solution Take 3 mLs by nebulization 4 (four) times daily as needed  iron  polysaccharides (FERREX) 150 mg iron  capsule Take 150 mg by mouth once daily  multivitamin tablet Take 1 tablet by mouth once daily  ondansetron  (ZOFRAN ) 4 MG tablet Take 4 mg by mouth  every 6 (six) hours as needed  oxyBUTYnin  (DITROPAN ) 5 mg tablet TAKE ONE TABLET BY MOUTH EVERY 8 HOURS AS NEEDED FOR FOR BLADDER SPASMS  oxyCODONE  (ROXICODONE ) 5 MG immediate release tablet TAKE ONE TABLET BY MOUTH TWICE DAILY AS NEEDED FOR PAIN 30 tablet 0  oxyCODONE -acetaminophen  (PERCOCET) 5-325 mg tablet  pantoprazole  (PROTONIX ) 40 MG DR tablet Take 1 tablet (40 mg total) by mouth once daily 30  tablet 2  predniSONE  (DELTASONE ) 20 MG tablet TAKE ONE TABLET BY MOUTH ONCE DAILY 30 tablet 1  revefenacin  (YUPELRI ) 175 mcg/3 mL nebulizer solution Take 175 mcg by nebulization once daily  rosuvastatin (CRESTOR) 20 MG tablet Take 20 mg by mouth at bedtime  sennosides-docusate (SENOKOT-S) 8.6-50 mg tablet Take 1 tablet by mouth once daily 30 tablet 0  tamsulosin  (FLOMAX ) 0.4 mg capsule Take 1 capsule by mouth once daily  traMADoL  (ULTRAM ) 50 mg tablet TAKE ONE TABLET BY MOUTH TWICE DAILY AS NEEDED 14 tablet 0  TRELEGY ELLIPTA 200-62.5-25 mcg inhaler Inhale 1 Puff into the lungs once daily   Allergies:  Pork Derived (Porcine) Other (Hives)   Past Medical History:  Centrilobular emphysema (CMS/HHS-HCC)  COPD (chronic obstructive pulmonary disease) (CMS/HHS-HCC)  Hyperlipidemia  Pulmonary nodules   Past Surgical History:  Limited arthroscopic debridement, arthroscopic subacromial decompression, mini-open rotator cuff repair augmented with Smith & Nephew Regeneten patch, and mini-open biceps tenodesis, right shoulder. Right 02/18/2023 (Dr.Gaytha Raybourn)   Family History:  Diabetes type II Mother  Diabetes type II Father   Social History:   Socioeconomic History:  Marital status: Legally Separated  Tobacco Use  Smoking status: Former  Current packs/day: 0.00  Average packs/day: 1 pack/day for 46.0 years (46.0 ttl pk-yrs)  Types: Cigarettes  Start date: 04/1976  Quit date: 04/2022  Years since quitting: 1.9  Smokeless tobacco: Never  Vaping Use  Vaping status: Never Used  Substance and Sexual Activity  Alcohol  use: Yes  Alcohol /week: 4.0 standard drinks of alcohol   Types: 2 Glasses of wine, 2 Cans of beer per week  Comment: socially  Drug use: Never  Sexual activity: Defer   Social Drivers of Health:   Physicist, medical Strain: Medium Risk (03/01/2024)  Overall Financial Resource Strain (CARDIA)  Difficulty of Paying Living Expenses: Somewhat hard  Food Insecurity: Food  Insecurity Present (03/01/2024)  Hunger Vital Sign  Worried About Running Out of Food in the Last Year: Sometimes true  Ran Out of Food in the Last Year: Sometimes true  Transportation Needs: No Transportation Needs (03/01/2024)  PRAPARE - Risk analyst (Medical): No  Lack of Transportation (Non-Medical): No   Review of Systems:  A comprehensive 14 point ROS was performed, reviewed, and the pertinent orthopaedic findings are documented in the HPI.  Physical Exam: Vitals:  03/01/24 1448  BP: 120/70  Weight: 65.3 kg (144 lb)  Height: 182.9 cm (6')  PainSc: 7  PainLoc: Shoulder   General/Constitutional: The patient appears to be well-nourished, well-developed, and in no acute distress. Neuro/Psych: Normal mood and affect, oriented to person, place and time. Eyes: Non-icteric. Pupils are equal, round, and reactive to light, and exhibit synchronous movement. ENT: Unremarkable. Lymphatic: No palpable adenopathy. Respiratory: Lungs clear to auscultation, Normal chest excursion, No wheezes, and Non-labored breathing Cardiovascular: Regular rate and rhythm. No murmurs. and No edema, swelling or tenderness, except as noted in detailed exam. Integumentary: No impressive skin lesions present, except as noted in detailed exam. Musculoskeletal: Unremarkable, except as noted in detailed exam.  Right shoulder exam: On examination,  his arthroscopic portal sites and surgical incision remain well-healed and without evidence for infection. No swelling, erythema, ecchymosis, abrasions, or other skin abnormalities are identified. He experiences mild-moderate tenderness diffusely over the anterolateral aspect of the shoulder. Actively, he can forward flex to 95 degrees, abduct to 80 degrees, and internally rotate to the right PSIS. Passively, he can tolerate forward flexion to 150 degrees and abduction to 150 degrees. He experiences moderate pain at the extremes of these motions. He  exhibits 3+-4/5 strength with gentle resisted forward flexion and abduction, and 4/5 strength with internal and external rotation. Again, he has pain with resisted forward flexion and abduction. He remains grossly neurovascularly intact to the right upper extremity and hand.  X-rays/MRI/Lab data:  Recent MRI scan of the right shoulder is available for review and has been reviewed by myself. By report, the study demonstrates evidence of a recurrent rotator cuff tear involving the posterior portion of the supraspinatus tendon. In addition, there is an area of bone necrosis involving the superior portion of the humeral head. Both the films and report were reviewed by myself and discussed with the patient.  Assessment: 1. Recurrent rotator cuff tear, right shoulder. 2. Rotator cuff tendinitis, right.  3. Avascular necrosis right humeral head. 4. Primary osteoarthritis of right shoulder.   Plan: The treatment options were discussed with the patient. In addition, patient educational materials were provided regarding the diagnosis and treatment options. The patient is quite frustrated by his symptoms and functional limitations, and is ready to consider more aggressive treatment options. Therefore, I have recommended a surgical procedure, specifically a reverse right total shoulder arthroplasty. The procedure was discussed with the patient, as were the potential risks (including bleeding, infection, nerve and/or blood vessel injury, persistent or recurrent pain, loosening and/or failure of the components, dislocation, need for further surgery, blood clots, strokes, heart attacks and/or arhythmias, pneumonia, etc.) and benefits. The patient states his understanding and wishes to proceed. All of the patient's questions and concerns were answered. He can call any time with further concerns. He will follow up post-surgery, routine.    H&P reviewed and patient re-examined. No changes.

## 2024-06-17 NOTE — Op Note (Signed)
 06/17/2024  9:50 AM  Patient:   Benjamin Bolton  Pre-Op Diagnosis:   Recurrent rotator cuff tear with AVN of humeral head right shoulder.  Post-Op Diagnosis:   Same  Procedure:   Reverse right total shoulder arthroplasty.  Surgeon:   DOROTHA Reyes Maltos, MD  Assistant:   Gustavo Level, PA-C  Anesthesia:   GET  Findings:   As above.  Complications:   None  EBL:   75 cc  Fluids:   300 cc crystalloid  UOP:   None  TT:   None  Drains:   None  Closure:   Staples  Implants:   All press-fit Arthrex system with a #11 [employer] Revers humeral stem, a 39 mm SutureCup with a +3 mm insert, and a 24 mm base plate with a 39 mm +4 mm lateralized glenosphere.  Brief Clinical Note:   The patient is a 63 year old male with recurrent right shoulder pain and weakness status post a rotator cuff repair performed 16 months ago. The symptoms have progressed despite medications, activity modification, etc. His history and examination are consistent with a recurrent rotator cuff tear. Preoperative MRI scanning confirmed the presence of a recurrent rotator cuff tear but also demonstrated focal avascular necrosis involving the superior portion of the humeral head. The patient presents at this time for a reverse right total shoulder arthroplasty.  Procedure:   The patient was brought into the operating room and lain in the supine position. The patient then underwent general endotracheal intubation and anesthesia before the patient was repositioned in the beach chair position using the beach chair positioner. The right shoulder and upper extremity were prepped with ChloraPrep solution before being draped sterilely. Preoperative antibiotics were administered. A timeout was performed to verify the appropriate surgical site.    A standard anterior approach to the shoulder was made through an approximately 4 inch incision. The incision was carried down through the subcutaneous tissues to expose the deltopectoral  fascia. The interval between the deltoid and pectoralis muscles was identified and this plane developed, retracting the cephalic vein laterally with the deltoid muscle. The conjoined tendon was identified. Its lateral margin was dissected and the Kolbel self-retraining retractor inserted. The three sisters were identified and cauterized. Bursal tissues were removed to improve visualization.   The subscapularis tendon was released from its attachment to the lesser tuberosity 1 cm proximal to its insertion and several tagging sutures placed. The inferior capsule was released with care after identifying and protecting the axillary nerve. The proximal humeral cut was made at approximately 30 of retroversion using the extra-medullary guide.   Attention was redirected to the glenoid. The labrum was debrided circumferentially before the center of the glenoid was marked with electrocautery. The guidewire was drilled into the glenoid neck using the appropriate guide. After verifying its position, it was overreamed with the baseplate reamer to create a flat surface before the peripheral reamer was introduced to clean off any peripheral soft tissues. The central 10 mm coring reamer was then inserted before the hole was tapped to complete the glenoid preparation. The permanent mini-baseplate construct with a 30 mm screw attachment was screwed into place and tightened securely. The baseplate was then further secured using four peripheral screws. Locking screws were placed superiorly and inferiorly while one non-locking screw was placed posteriorly. The permanent 39 mm +4 mm lateralized glenosphere was then impacted into place and its Morse taper locking mechanism verified using manual distraction. Finally, the glenosphere locking screw was inserted and tightened  securely.  Attention was directed to the humeral side. The humeral canal was reamed with first the 5 mm and then the 6 mm reamer. The canal was broached  beginning with a #5 broach and progressing to a #11 broach. This was left in place and the metaphyseal inset reamer was used to create the metaphyseal socket.  A trial reduction performed using the 39 mm trial humeral platform with the +3 mm insert. The arm demonstrated excellent range of motion as the hand could be brought across the chest to the opposite shoulder and brought to the top of the patient's head and to the patient's ear. The shoulder appeared stable throughout this range of motion. The joint was dislocated and the trial components removed. The permanent #11 [employer] Revers stem was impacted into place with care taken to maintain the appropriate version. The permanent 39 mm SutureCup humeral platform was then impacted into place before the +3 mm insert was snapped into place. The shoulder was relocated using two finger pressure and again placed through a range of motion with the findings as described above.  The wound was copiously irrigated with sterile saline solution using the jet lavage system before a solution of 30 cc of 0.5% Sensorcaine  with epinephrine  and 20 cc of Exparel  was injected into the pericapsular and peri-incisional tissues to help with postoperative analgesia. The subscapularis tendon was reapproximated using #2 FiberWire interrupted sutures. The deltopectoral interval was closed using #0 Vicryl interrupted sutures before the subcutaneous tissues were closed using 2-0 Vicryl interrupted sutures. The skin was closed using staples. A sterile occlusive dressing was applied to the wound before the arm was placed into a shoulder immobilizer with an abduction pillow. A Polar Care system also was applied to the shoulder. The patient was then transferred back to a hospital bed before being awakened, extubated, and returned to the recovery room in satisfactory condition after tolerating the procedure well.

## 2024-06-17 NOTE — Anesthesia Preprocedure Evaluation (Signed)
 Anesthesia Evaluation  Patient identified by MRN, date of birth, ID band Patient awake    Reviewed: Allergy & Precautions, H&P , NPO status , Patient's Chart, lab work & pertinent test results, reviewed documented beta blocker date and time   Airway Mallampati: III  TM Distance: >3 FB Neck ROM: full    Dental  (+) Teeth Intact   Pulmonary shortness of breath, with exertion and lying, asthma , pneumonia, resolved, COPD,  COPD inhaler, Current Smoker and Patient abstained from smoking.   Pulmonary exam normal        Cardiovascular Exercise Tolerance: Poor (-) angina + Orthopnea and + DOE  (-) Peripheral Vascular Disease, (-) CHF, (-) PND and (-) DVT Normal cardiovascular exam Rhythm:regular Rate:Normal     Neuro/Psych Seizures -, Well Controlled,   negative psych ROS   GI/Hepatic Neg liver ROS,GERD  Medicated,,  Endo/Other  negative endocrine ROSdiabetes, Well Controlled    Renal/GU negative Renal ROS  negative genitourinary   Musculoskeletal   Abdominal   Peds  Hematology  (+) Blood dyscrasia, anemia   Anesthesia Other Findings Past Medical History: No date: Anticoagulated on apixaban  No date: Aortic atherosclerosis No date: Asthma No date: COPD (chronic obstructive pulmonary disease) (HCC) No date: DM (diabetes mellitus), type 2 (HCC) No date: Dyspnea No date: GERD (gastroesophageal reflux disease) No date: HLD (hyperlipidemia) No date: Hoarseness of voice     Comment:  a.) following extended intubation (09/13/2023 -               01/27-2025) for RSV PNA No date: Hx of migraines No date: Normochromic anemia No date: Oxygen dependent (3L/[place]) 2023: Pneumonia 09/2023: Pneumonia due to respiratory syncytial virus (RSV)     Comment:  a.) intubated for mechanical ventilation form 09/13/2023              - 09/29/2023 (extubated to BiPAP) 02/26/2023: Postoperative deep vein thrombosis (DVT)     Comment:  a.)  proximal RIGHT femoral; b.) developed               postoperatively (POD # 8) following shoulder arthroscopy               for rotator cuff repair No date: Protein calorie malnutrition No date: Pulmonary nodule No date: Rotator cuff tear, right No date: Rotator cuff tendonitis, right No date: S/P percutaneous endoscopic gastrostomy (PEG) tube placement  (HCC) No date: S/P right rotator cuff repair No date: Seizures (HCC)     Comment:  as a teenager No date: Tobacco abuse Past Surgical History: 05/2024: CATARACT EXTRACTION, BILATERAL; Bilateral No date: CHEST TUBE INSERTION No date: COLONOSCOPY 10/21/2023: IR GASTROSTOMY TUBE MOD SED No date: PEG TUBE REMOVAL 02/18/2023: SHOULDER ARTHROSCOPY WITH SUBACROMIAL DECOMPRESSION,  ROTATOR CUFF REPAIR AND BICEP TENDON REPAIR; Right     Comment:  Procedure: RIGHT SHOULDER ARTHROSCOPY WITH DEBRIDEMENT,               DECOMPRESSION, ROTATOR CUFF REPAIR, BICEPS TENODESIS.;                Surgeon: Edie Norleen PARAS, MD;  Location: [facility] ORS;                Service: Orthopedics;  Laterality: Right;   Reproductive/Obstetrics negative OB ROS                              Anesthesia Physical Anesthesia Plan  ASA: 3  Anesthesia Plan: General  ETT   Post-op Pain Management:    Induction:   PONV Risk Score and Plan: 2  Airway Management Planned:   Additional Equipment:   Intra-op Plan:   Post-operative Plan:   Informed Consent: I have reviewed the patients History and Physical, chart, labs and discussed the procedure including the risks, benefits and alternatives for the proposed anesthesia with the patient or authorized representative who has indicated his/her understanding and acceptance.     Dental Advisory Given  Plan Discussed with: CRNA  Anesthesia Plan Comments: (Will defer on isnb secondary to baseline pulmonary issues.  Pt feels that he is optimized regarding his breathing at present.  He is deemed high  risk per his pulmonary eval. Preop. Pt is aware of potential for post op vent management. ja)        Anesthesia Quick Evaluation

## 2024-06-18 LAB — GLUCOSE, CAPILLARY
Glucose-Capillary: 159 mg/dL — ABNORMAL HIGH (ref 70–99)
Glucose-Capillary: 164 mg/dL — ABNORMAL HIGH (ref 70–99)
Glucose-Capillary: 154 mg/dL — ABNORMAL HIGH (ref 70–99)
Glucose-Capillary: 250 mg/dL — ABNORMAL HIGH (ref 70–99)
Glucose-Capillary: 164 mg/dL — ABNORMAL HIGH (ref 70–99)

## 2024-06-18 MED ORDER — OXYCODONE HCL 5 MG PO TABS: 5.0000 mg | ORAL_TABLET | ORAL | 0 refills | Status: DC | PRN

## 2024-06-18 NOTE — Progress Notes (Incomplete)
 Subjective: 2 Days Post-Op Procedure(s) (LRB): ARTHROPLASTY, SHOULDER, TOTAL, REVERSE (Right) Patient reports pain as more moderate this AM.   Patient is well, and has had no acute complaints or problems Denies any CP, SOB, N/V, fevers or chills Does state he has had dry eyes No abdominal pain. Still working on [facility] Plan is to go to SNF after hospital stay.  Objective: Vital signs in last 24 hours: Temp:  [97.8 F (36.6 C)-98.3 F (36.8 C)] 97.9 F (36.6 C) (10/18 0831) Pulse Rate:  [75-105] 105 (10/18 0831) Resp:  [16-18] 16 (10/18 0831) BP: (113-121)/(67-78) 119/67 (10/18 0831) SpO2:  [99 %-100 %] 99 % (10/18 0831)  Intake/Output from previous day:  Intake/Output Summary (Last 24 hours) at 06/19/2024 0911 Last data filed at 06/19/2024 0500 Gross per 24 hour  Intake --  Output 875 ml  Net -875 ml    Intake/Output this shift: No intake/output data recorded.  Labs: No results for input(s): HGB in the last 72 hours. No results for input(s): WBC, RBC, HCT, PLT in the last 72 hours. No results for input(s): NA, K, CL, CO2, BUN, CREATININE, GLUCOSE, CALCIUM in the last 72 hours. No results for input(s): LABPT, INR in the last 72 hours.  EXAM General - Patient is Alert, Appropriate, and Oriented Extremity - Neurologically intact Neurovascular intact Sensation intact distally Intact pulses distally No cellulitis present Compartment soft Able to flex and extend fingers without issue Dressing - dressing C/D/I and no drainage Motor Function - intact, moving foot and toes well on exam.   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[place])    Pneumonia 2023   Pneumonia due to respiratory syncytial virus (RSV) 09/2023   a.) intubated for mechanical ventilation form 09/13/2023 - 09/29/2023 (extubated to BiPAP)   Postoperative deep vein thrombosis (DVT) 02/26/2023   a.) proximal RIGHT femoral; b.) developed postoperatively (POD # 8) following shoulder arthroscopy for rotator cuff repair   Protein calorie malnutrition    Pulmonary nodule    Rotator cuff tear, right    Rotator cuff tendonitis, right    S/P percutaneous endoscopic gastrostomy (PEG) tube placement (HCC)    S/P right rotator cuff repair    Seizures (HCC)    as a teenager   Tobacco abuse     Assessment/Plan: 2 Days Post-Op Procedure(s) (LRB): ARTHROPLASTY, SHOULDER, TOTAL, REVERSE (Right) Principal Problem:   Status post reverse arthroplasty of shoulder, right  Estimated body mass index is 21.38 kg/m as calculated from the following:   Height as of this encounter: 6' (1.829 m).   Weight as of this encounter: 71.5 kg.  VSS with patient on at home O2 levels  Artificial tears added for eye comfort  Been working well with PT, will continue to work with PT and OT in rehab. Suggested limitations secondary to O2, safety at home with daily ADLs  Patient may utilize tylenol  and oxycodone  for as needed pain.  Continue to utilize regular ice application. TOC looking for SNF placement  DVT Prophylaxis: Eliquis  and SCDs  Patient will follow-up with [facility] clinic orthopedics in 2 weeks for staple removal and reevaluation  Fonda Koyanagi, PA-C [facility] 06/19/2024, 9:11 AM

## 2024-06-18 NOTE — Plan of Care (Signed)

## 2024-06-18 NOTE — [place] FL2 (Signed)
 [place]  MEDICAID FL2 LEVEL OF CARE FORM     IDENTIFICATION  Patient Name: Benjamin Bolton Birthdate: [birth year] Sex: male Admission Date (Current Location): 06/17/2024  [place] and [place] Number:  Chiropodist and Address:  [facility], [address] [place], [place] [address]      Provider Number: [id number]  Attending Physician Name and Address:  Edie Norleen PARAS, MD  Relative Name and Phone Number:       Current Level of Care: Hospital Recommended Level of Care: Skilled Nursing Facility Prior Approval Number:    Date Approved/Denied:   PASRR Number: [id number]  Discharge Plan: SNF    Current Diagnoses: Patient Active Problem List   Diagnosis Date Noted   Status post reverse arthroplasty of shoulder, right 06/17/2024   Pain due to onychomycosis of toenails of both feet 12/01/2023   Fever 10/14/2023   Diarrhea 10/12/2023   Electrolyte abnormality 10/08/2023   Normochromic anemia 10/08/2023   Elevated troponin 10/08/2023   Pre-diabetes    Malnutrition of moderate degree 09/15/2023   COPD exacerbation (HCC) 09/10/2023   Protein-calorie malnutrition, severe 09/10/2023   Acute on chronic respiratory failure with hypoxia (HCC) 09/10/2023   UTI (urinary tract infection) 09/10/2023   DVT (deep venous thrombosis) (HCC)    Tobacco abuse    HLD (hyperlipidemia)    Status post right rotator cuff repair 02/18/2023    Orientation RESPIRATION BLADDER Height & Weight     Self, Time, Situation, Place  Normal Continent Weight: 157 lb 10.1 oz (71.5 kg) Height:  6' (182.9 cm)  BEHAVIORAL SYMPTOMS/MOOD NEUROLOGICAL BOWEL NUTRITION STATUS   (None)  (None) Continent Diet (Heart healthy/carb modified)  AMBULATORY STATUS COMMUNICATION OF NEEDS Skin   Limited Assist Verbally Surgical wounds (Incision on right shoulder: [place].)                       Personal Care Assistance Level of Assistance  Bathing, Feeding, Dressing  Bathing Assistance: Maximum assistance Feeding assistance: Limited assistance Dressing Assistance: Maximum assistance     Functional Limitations Info  Sight, Hearing, Speech Sight Info: Adequate Hearing Info: Adequate Speech Info: Adequate    SPECIAL CARE FACTORS FREQUENCY  PT (By licensed PT), OT (By licensed OT)     PT Frequency: 5 x week OT Frequency: 5 x week            Contractures Contractures Info: Not present    Additional Factors Info  Code Status, Allergies Code Status Info: Full code Allergies Info: NKDA           Current Medications (06/18/2024):  This is the current hospital active medication list Current Facility-Administered Medications  Medication Dose Route Frequency Provider Last Rate Last Admin   0.9 %  sodium chloride  infusion   Intravenous Continuous Poggi, John J, MD   Stopped at 06/17/24 1727   acetaminophen  (TYLENOL ) tablet 1,000 mg  1,000 mg Oral Q6H Poggi, John J, MD   1,000 mg at 06/17/24 2333   acetaminophen  (TYLENOL ) tablet 325-650 mg  325-650 mg Oral Q6H PRN Poggi, John J, MD       albuterol  (PROVENTIL ) (2.5 MG/3ML) 0.083% nebulizer solution 3 mL  3 mL Inhalation Q4H PRN Poggi, Norleen PARAS, MD   3 mL at 06/17/24 2359   apixaban  (ELIQUIS ) tablet 5 mg  5 mg Oral BID Poggi, Norleen PARAS, MD   5 mg at 06/18/24 0914   bisacodyl  (DULCOLAX) suppository 10 mg  10 mg Rectal Daily  PRN Poggi, John J, MD       budesonide-glycopyrrolate-formoterol (BREZTRI) 160-9-4.8 MCG/ACT inhaler 2 puff  2 puff Inhalation BID Edie, Norleen PARAS, MD   2 puff at 06/18/24 9095   diphenhydrAMINE  (BENADRYL ) 12.5 MG/5ML elixir 12.5-25 mg  12.5-25 mg Oral Q4H PRN Poggi, John J, MD       docusate sodium  (COLACE) capsule 100 mg  100 mg Oral BID Poggi, John J, MD   100 mg at 06/18/24 9085   gabapentin  (NEURONTIN ) capsule 300 mg  300 mg Oral TID Poggi, John J, MD   300 mg at 06/18/24 0915   guaiFENesin  (MUCINEX ) 12 hr tablet 600 mg  600 mg Oral BID PRN Poggi, John J, MD       HYDROmorphone   (DILAUDID ) injection 0.25-0.5 mg  0.25-0.5 mg Intravenous Q2H PRN Poggi, John J, MD       insulin  aspart (novoLOG ) injection 0-15 Units  0-15 Units Subcutaneous TID WC Poggi, John J, MD   3 Units at 06/18/24 9083   magnesium  hydroxide (MILK OF MAGNESIA) suspension 30 mL  30 mL Oral Daily PRN Poggi, Norleen PARAS, MD       metoCLOPramide  (REGLAN ) tablet 5-10 mg  5-10 mg Oral Q8H PRN Poggi, John J, MD       Or   metoCLOPramide  (REGLAN ) injection 5-10 mg  5-10 mg Intravenous Q8H PRN Poggi, John J, MD       midodrine  (PROAMATINE ) tablet 5 mg  5 mg Oral TID WC Poggi, John J, MD   5 mg at 06/18/24 9085   multivitamin with minerals tablet 1 tablet  1 tablet Oral Daily Poggi, Norleen PARAS, MD   1 tablet at 06/18/24 9085   nicotine  (NICODERM CQ  - dosed in mg/24 hours) patch 14 mg  14 mg Transdermal Daily Poggi, John J, MD   14 mg at 06/18/24 9083   ondansetron  (ZOFRAN ) tablet 4 mg  4 mg Oral Q6H PRN Poggi, John J, MD       Or   ondansetron  (ZOFRAN ) injection 4 mg  4 mg Intravenous Q6H PRN Poggi, John J, MD       Oral care mouth rinse  15 mL Mouth Rinse PRN Poggi, Norleen PARAS, MD       oxybutynin  (DITROPAN ) tablet 5 mg  5 mg Oral Q8H PRN Poggi, John J, MD       oxyCODONE  (Oxy IR/ROXICODONE ) immediate release tablet 5-10 mg  5-10 mg Oral Q4H PRN Poggi, John J, MD   5 mg at 06/17/24 1815   pantoprazole  (PROTONIX ) EC tablet 40 mg  40 mg Oral Daily Poggi, John J, MD   40 mg at 06/18/24 0915   predniSONE  (DELTASONE ) tablet 10 mg  10 mg Oral Q breakfast Poggi, John J, MD   10 mg at 06/18/24 0915   sodium phosphate  (FLEET) enema 1 enema  1 enema Rectal Once PRN Poggi, Norleen PARAS, MD       tamsulosin  (FLOMAX ) capsule 0.4 mg  0.4 mg Oral QPC supper Poggi, John J, MD   0.4 mg at 06/17/24 1815     Discharge Medications: Please see discharge summary for a list of discharge medications.  Relevant Imaging Results:  Relevant Lab Results:   Additional Information SS#: [id number]  Lauraine JAYSON Carpen, LCSW

## 2024-06-18 NOTE — TOC Initial Note (Signed)
 Transition of Care [facility]) - Initial/Assessment Note    Patient Details  Name: Benjamin Bolton MRN: [id number] Date of Birth: 1961/06/07  [facility]) CM/SW Contact:    Lauraine JAYSON Carpen, LCSW Phone Number: 06/18/2024, 11:54 AM  Clinical Narrative:   CSW met with patient. No family at bedside. CSW introduced role and explained that therapy recommendations would be discussed. Patient is agreeable to SNF placement but does not want [facility]. Discussed that we will have to see if insurance will approve SNF placement. Discussed that he is able to ambulate so he will likely not qualify for ambulance transport. His sister may be able to transport to SNF if needed. No further concerns. CSW will continue to follow patient for support and facilitate discharge to SNF once medically stable.               Expected Discharge Plan: Skilled Nursing Facility Barriers to Discharge: Continued Medical Work up   Patient Goals and CMS Choice            Expected Discharge Plan and Services     Post Acute Care Choice: Skilled Nursing Facility Living arrangements for the past 2 months: Single Family Home                                      Prior Living Arrangements/Services Living arrangements for the past 2 months: Single Family Home Lives with:: Siblings Patient language and need for interpreter reviewed:: Yes Do you feel safe going back to the place where you live?: Yes      Need for Family Participation in Patient Care: Yes (Comment) Care giver support system in place?: Yes (comment)   Criminal Activity/Legal Involvement Pertinent to Current Situation/Hospitalization: No - Comment as needed  Activities of Daily Living   ADL Screening (condition at time of admission) Independently performs ADLs?: No Does the patient have a NEW difficulty with bathing/dressing/toileting/self-feeding that is expected to last >3 days?: Yes (Initiates electronic notice to provider  for possible OT consult) Does the patient have a NEW difficulty with getting in/out of bed, walking, or climbing stairs that is expected to last >3 days?: No Does the patient have a NEW difficulty with communication that is expected to last >3 days?: No Is the patient deaf or have difficulty hearing?: No Does the patient have difficulty seeing, even when wearing glasses/contacts?: No Does the patient have difficulty concentrating, remembering, or making decisions?: No  Permission Sought/Granted Permission sought to share information with : Facility Industrial/product designer granted to share information with : Yes, Verbal Permission Granted     Permission granted to share info w AGENCY: SNF's        Emotional Assessment Appearance:: Appears stated age Attitude/Demeanor/Rapport: Engaged, Gracious Affect (typically observed): Accepting, Appropriate, Calm, Pleasant Orientation: : Oriented to Self, Oriented to Place, Oriented to  Time, Oriented to Situation Alcohol  / Substance Use: Not Applicable Psych Involvement: No (comment)  Admission diagnosis:  Status post right rotator cuff repair [Z98.890] Nontraumatic complete tear of right rotator cuff [M75.121] Rotator cuff tendinitis, right [M75.81] 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] S/P arthroscopy of right shoulder [Z98.890] Status post reverse arthroplasty of shoulder, right [Z96.611] Patient Active Problem List   Diagnosis Date Noted   Status post reverse arthroplasty of shoulder, right 06/17/2024   Pain due to onychomycosis of toenails of both feet 12/01/2023   Fever 10/14/2023  Diarrhea 10/12/2023   Electrolyte abnormality 10/08/2023   Normochromic anemia 10/08/2023   Elevated troponin 10/08/2023   Pre-diabetes    Malnutrition of moderate degree 09/15/2023   COPD exacerbation (HCC) 09/10/2023   Protein-calorie malnutrition, severe 09/10/2023   Acute on chronic respiratory failure with hypoxia (HCC)  09/10/2023   UTI (urinary tract infection) 09/10/2023   DVT (deep venous thrombosis) (HCC)    Tobacco abuse    HLD (hyperlipidemia)    Status post right rotator cuff repair 02/18/2023   PCP:  Sadie Manna, MD Pharmacy:   [facility] - [place], [place] - [address] [place] [address] Phone: [phone] Fax: [phone]     Social Drivers of Health (SDOH) Social History: SDOH Screenings   Food Insecurity: No Food Insecurity (06/17/2024)  Recent Concern: Food Insecurity - Food Insecurity Present (04/15/2024)   Received from [facility]  Housing: Low Risk  (06/17/2024)  Transportation Needs: Unmet Transportation Needs (06/17/2024)  Utilities: Not At Risk (06/17/2024)  Recent Concern: Utilities - At Risk (04/15/2024)   Received from [facility]  Financial Resource Strain: Low Risk  (05/04/2024)   Received from [facility]  Recent Concern: Financial Resource Strain - High Risk (04/15/2024)   Received from [facility]  Physical Activity: Inactive (04/15/2024)   [facility]  Social Connections: Socially Isolated (04/15/2024)   Received from [facility]  Stress: No Stress Concern Present (04/15/2024)   Received from [facility]  Tobacco Use: High Risk (06/17/2024)  Health Literacy: Inadequate Health Literacy (04/15/2024)   Received from [facility]   SDOH Interventions:     Readmission Risk Interventions     No data to display

## 2024-06-18 NOTE — Progress Notes (Signed)
 Occupational Therapy Treatment Patient Details Name: Benjamin Bolton MRN: [id number] DOB: 26-Jul-1961 Today's Date: 06/18/2024   History of present illness Pt is a 63 year old male s/p R Reverse right total shoulder arthroplasty.    PMH significant for  severe COPD and asthma diagnosis.   OT comments  Chart reviewed to date, pt greeted in bed, agreeable to OT tx targeting improving functional activity tolerance in prep for ADL tasks. Pt continues to require MOD A for sling/polar care/UB dressing management on this date. He is making progress on self direction of tasks and independence,but continues to require physical assist for management. Please see further details below. OT will continue to follow.       If plan is discharge home, recommend the following:  A little help with walking and/or transfers;A lot of help with bathing/dressing/bathroom   Equipment Recommendations  Tub/shower seat    Recommendations for Other Services      Precautions / Restrictions Precautions Precautions: Fall Recall of Precautions/Restrictions: Intact Required Braces or Orthoses: Sling Restrictions Weight Bearing Restrictions Per Provider Order: Yes RUE Weight Bearing Per Provider Order: Non weight bearing       Mobility Bed Mobility Overal bed mobility: Needs Assistance Bed Mobility: Supine to Sit     Supine to sit: Supervision, HOB elevated          Transfers Overall transfer level: Needs assistance Equipment used: None Transfers: Sit to/from Stand Sit to Stand: Contact guard assist                 Balance Overall balance assessment: Needs assistance Sitting-balance support: Feet supported Sitting balance-Leahy Scale: Good     Standing balance support: No upper extremity supported Standing balance-Leahy Scale: Fair                             ADL either performed or assessed with clinical judgement   ADL Overall ADL's : Needs assistance/impaired                    Upper Body Dressing Details (indicate cue type and reason): MOD A of doffing/donnign gown, polar care, sling with step by step vcs Lower Body Dressing: Maximal assistance   Toilet Transfer: Contact guard assist;Ambulation Toilet Transfer Details (indicate cue type and reason): simulated                Extremity/Trunk Assessment              Vision       Perception     Praxis     Communication Communication Communication: No apparent difficulties   Cognition Arousal: Alert Behavior During Therapy: WFL for tasks assessed/performed Cognition: No apparent impairments                               Following commands: Intact        Cueing   Cueing Techniques: Verbal cues, Visual cues  Exercises Other Exercises Other Exercises: edu re role of OT, role of rehab, discharge recommendations, RUE sling management    Shoulder Instructions       General Comments pt bumped up to 4L per his request spo2 >90% throughout    Pertinent Vitals/ Pain       Pain Assessment Pain Assessment: 0-10 Pain Score: 9  Pain Location: R shoulder Pain Descriptors / Indicators: Discomfort, Aching, Throbbing Pain Intervention(s): Monitored during  session, Repositioned, Ice applied  Home Living                                          Prior Functioning/Environment              Frequency  Min 3X/week        Progress Toward Goals  OT Goals(current goals can now be found in the care plan section)  Progress towards OT goals: Progressing toward goals  Acute Rehab OT Goals Time For Goal Achievement: 07/01/24  Plan      Co-evaluation                 AM-PAC OT 6 Clicks Daily Activity     Outcome Measure   Help from another person eating meals?: None Help from another person taking care of personal grooming?: None Help from another person toileting, which includes using toliet, bedpan, or urinal?: A Little Help  from another person bathing (including washing, rinsing, drying)?: A Lot Help from another person to put on and taking off regular upper body clothing?: A Lot Help from another person to put on and taking off regular lower body clothing?: A Lot 6 Click Score: 17    End of Session    OT Visit Diagnosis: Other abnormalities of gait and mobility (R26.89)   Activity Tolerance Patient tolerated treatment well   Patient Left in chair;with call bell/phone within reach;with chair alarm set   Nurse Communication Mobility status        Time: 9070-9049 OT Time Calculation (min): 21 min  Charges: OT General Charges $OT Visit: 1 Visit OT Treatments $Self Care/Home Management : 8-22 mins  Therisa Sheffield, OTD OTR/L  06/18/24, 1:20 PM

## 2024-06-18 NOTE — Progress Notes (Signed)
 Physical Therapy Treatment Patient Details Name: Benjamin Bolton MRN: [id number] DOB: 09/17/1960 Today's Date: 06/18/2024   History of Present Illness Pt is a 63 year old male s/p R Reverse right total shoulder arthroplasty.    PMH significant for  severe COPD and asthma diagnosis.    PT Comments  Pt seen for 2nd PT session this date. Pt pleasant and motivated to participate throughout session. Pt was met sitting in recliner, performed 4 total STS from recliner with CGA and good eccentric control. Session focused on dynamic standing balance and ambulation. Pt performed standing reaching tasks with LUE focusing on reaching outside BOS with variable foot positions, no LOB throughout but pt required seated rest breaks between exercises. Pt amb ~159ft total with no rest break, slow cadence throughout and increased knee instability with increased distance, but no overt LOB. Pt able to dual-task (walk and talk) and perform horizontal head turns with minimal path deviation. Pt was left seated in recliner at end of session, all needs in reach. HR and SpO2 remained WFL throughout with pt on 4L [place]. Continue to believe pt would have increased difficulty managing ADLs at home with NWB RUE status with use of sling despite good mobility performance in PT sessions, would benefit from skilled PT intervention to continue progressing independence with mobility/ADLs.     If plan is discharge home, recommend the following: A little help with walking and/or transfers;A lot of help with bathing/dressing/bathroom;Assistance with cooking/housework;Assist for transportation;Help with stairs or ramp for entrance   Can travel by private vehicle     Yes  Equipment Recommendations  Other (comment) (TBD)    Recommendations for Other Services       Precautions / Restrictions Precautions Precautions: Fall Recall of Precautions/Restrictions: Intact Required Braces or Orthoses: Sling Restrictions Weight Bearing  Restrictions Per Provider Order: Yes RUE Weight Bearing Per Provider Order: Non weight bearing     Mobility  Bed Mobility               General bed mobility comments: NT, pt seated in recliner pre/post session    Transfers Overall transfer level: Needs assistance Equipment used: None Transfers: Sit to/from Stand Sit to Stand: Contact guard assist           General transfer comment: 4 total STS from recliner with CGA, good eccentric control to sitting throughout    Ambulation/Gait Ambulation/Gait assistance: Contact guard assist Gait Distance (Feet): 100 Feet Assistive device: None Gait Pattern/deviations: Step-through pattern, Decreased stride length, Narrow base of support Gait velocity: decreased     General Gait Details: No overt LOB, increased knee instability and variable R foot placement with increased amb distance. Slow cadence throughout, able to perform horizontal head turns with amb with no excessive path deviation   Stairs Stairs: Yes Stairs assistance: Contact guard assist Stair Management: One rail Left, Step to pattern, Forwards, Backwards Number of Stairs: 4 General stair comments: Pt able to navigate set of 4 stairs ascending forward and descending backward with L hand rail and CGA, step-to pattern throughout with no LOB   Wheelchair Mobility     Tilt Bed    Modified Rankin (Stroke Patients Only)       Balance Overall balance assessment: Needs assistance Sitting-balance support: Feet supported Sitting balance-Leahy Scale: Good Sitting balance - Comments: steady static and dynamic sitting   Standing balance support: Single extremity supported Standing balance-Leahy Scale: Fair Standing balance comment: mild increased postural sway in standing  Communication Communication Communication: No apparent difficulties  Cognition Arousal: Alert Behavior During Therapy: WFL for tasks  assessed/performed   PT - Cognitive impairments: No apparent impairments                       PT - Cognition Comments: A&Ox4, pleasant Following commands: Intact      Cueing Cueing Techniques: Verbal cues, Visual cues  Exercises Other Exercises Other Exercises: Pt performed several rounds of standing dynamic balance tasks with reaching outside BOS for objects on tray table with various foot positions including normal stance, Rhomberg, and semi-tandem. Seated rest breaks provided between sets. Other Exercises: HR and SpO2 monitored during session with pt on 4L throughout: HR increased to 112bpm max with activity, in high 80s to low 90s at rest, SpO2 >93% throughout    General Comments General comments (skin integrity, edema, etc.): pt bumped up to 4L per his request spo2 >90% throughout      Pertinent Vitals/Pain Pain Assessment Pain Assessment: 0-10 Pain Score: 7  Pain Location: R shoulder Pain Descriptors / Indicators: Discomfort, Aching, Throbbing Pain Intervention(s): Monitored during session, Repositioned, Ice applied    Home Living                          Prior Function            PT Goals (current goals can now be found in the care plan section) Progress towards PT goals: Progressing toward goals    Frequency    BID      PT Plan      Co-evaluation              AM-PAC PT 6 Clicks Mobility   Outcome Measure  Help needed turning from your back to your side while in a flat bed without using bedrails?: A Little Help needed moving from lying on your back to sitting on the side of a flat bed without using bedrails?: A Little Help needed moving to and from a bed to a chair (including a wheelchair)?: A Little Help needed standing up from a chair using your arms (e.g., wheelchair or bedside chair)?: A Little Help needed to walk in hospital room?: A Little Help needed climbing 3-5 steps with a railing? : A Little 6 Click Score: 18     End of Session Equipment Utilized During Treatment: Gait belt;Oxygen Activity Tolerance: Patient tolerated treatment well Patient left: in chair;with call bell/phone within reach;with chair alarm set;with SCD's reapplied Nurse Communication: Mobility status PT Visit Diagnosis: Unsteadiness on feet (R26.81);Other abnormalities of gait and mobility (R26.89);Muscle weakness (generalized) (M62.81)     Time: 8580-8550 PT Time Calculation (min) (ACUTE ONLY): 30 min  Charges:    $Gait Training: 8-22 mins $Therapeutic Exercise: 8-22 mins PT General Charges $$ ACUTE PT VISIT: 1 Visit                     Janell Axe, SPT

## 2024-06-18 NOTE — Progress Notes (Signed)
 Physical Therapy Treatment Patient Details Name: Benjamin Bolton MRN: [id number] DOB: 10-Jan-1961 Today's Date: 06/18/2024   History of Present Illness Pt is a 63 year old male s/p R Reverse right total shoulder arthroplasty.    PMH significant for  severe COPD and asthma diagnosis.    PT Comments  Pt A&Ox4, pleasant and motivated to participate in PT tx. Pt was met sitting in recliner, performed 2 STS with CGA, good eccentric control throughout. Pt amb ~199ft over 2 bouts with seated rest break provided between due to SOB, no overt LOB with mild knee instability in stance, slow cadence throughout. Pt able to navigate set of 4 stairs with L hand rail, ascending forward and descending backward with step-to pattern, no LOB. HR and SpO2 monitored closely throughout session and remained WFL throughout session on 4L [place]. Pt was left seated in recliner at end of session, all needs in reach. Pt has limited to no familial support at home, will have to manage shoulder sling and portable O2 tank independently, predict pt will have increased difficulty managing sling and NWB RUE status for purposes of performing ADLs. Pt would benefit from skilled PT intervention to progress toward goals and allow for independent performance of mobility/ADLs.    If plan is discharge home, recommend the following: A little help with walking and/or transfers;A lot of help with bathing/dressing/bathroom;Assistance with cooking/housework;Assist for transportation;Help with stairs or ramp for entrance   Can travel by private vehicle     Yes  Equipment Recommendations  Other (comment) (TBD at next venue of care)    Recommendations for Other Services       Precautions / Restrictions Precautions Precautions: Fall Recall of Precautions/Restrictions: Intact Required Braces or Orthoses: Sling Restrictions Weight Bearing Restrictions Per Provider Order: Yes RUE Weight Bearing Per Provider Order: Non weight bearing      Mobility  Bed Mobility               General bed mobility comments: NT, pt seated in recliner pre/post session    Transfers Overall transfer level: Needs assistance Equipment used: None Transfers: Sit to/from Stand Sit to Stand: Contact guard assist           General transfer comment: 2 STS from recliner, 2 from transport chair, each with CGA. Good eccentric control throughout    Ambulation/Gait Ambulation/Gait assistance: Contact guard assist Gait Distance (Feet): 100 Feet (over 2 bouts) Assistive device: None Gait Pattern/deviations: Step-through pattern, Decreased stride length, Narrow base of support Gait velocity: decreased     General Gait Details: Slow cadence throughout, no overt LOB but mild knee instability, pt begins to feel SOB with increased distance, seated rest break provided between bouts.   Stairs Stairs: Yes Stairs assistance: Contact guard assist Stair Management: One rail Left, Step to pattern, Forwards, Backwards Number of Stairs: 4 General stair comments: Pt able to navigate set of 4 stairs ascending forward and descending backward with L hand rail and CGA, step-to pattern throughout with no LOB   Wheelchair Mobility     Tilt Bed    Modified Rankin (Stroke Patients Only)       Balance Overall balance assessment: Needs assistance Sitting-balance support: Feet supported Sitting balance-Leahy Scale: Good Sitting balance - Comments: steady static and dynamic sitting   Standing balance support: Single extremity supported Standing balance-Leahy Scale: Fair Standing balance comment: mild increased postural sway in standing  Communication Communication Communication: No apparent difficulties  Cognition Arousal: Alert Behavior During Therapy: WFL for tasks assessed/performed   PT - Cognitive impairments: No apparent impairments                       PT - Cognition Comments: A&Ox4,  pleasant Following commands: Intact      Cueing Cueing Techniques: Verbal cues, Visual cues  Exercises Other Exercises Other Exercises: SpO2 monitored throughout session with pt on 4L [place]. After bout of amb SpO2 95% HR high 90s to low 100s, after navigating stairs SpO2 93% HR mid to high 90s. After additional bout of amb SpO2 92-93% HR high 90s. At end of session, SpO2 98% HR 88bpm.    General Comments        Pertinent Vitals/Pain Pain Assessment Pain Assessment: 0-10 Pain Score: 9  Pain Location: R shoulder after mobility Pain Descriptors / Indicators: Aching, Throbbing Pain Intervention(s): Monitored during session, Repositioned, Ice applied    Home Living                          Prior Function            PT Goals (current goals can now be found in the care plan section) Progress towards PT goals: Progressing toward goals    Frequency    BID      PT Plan      Co-evaluation              AM-PAC PT 6 Clicks Mobility   Outcome Measure  Help needed turning from your back to your side while in a flat bed without using bedrails?: A Little Help needed moving from lying on your back to sitting on the side of a flat bed without using bedrails?: A Little Help needed moving to and from a bed to a chair (including a wheelchair)?: A Little Help needed standing up from a chair using your arms (e.g., wheelchair or bedside chair)?: A Little Help needed to walk in hospital room?: A Little Help needed climbing 3-5 steps with a railing? : A Little 6 Click Score: 18    End of Session Equipment Utilized During Treatment: Gait belt;Oxygen Activity Tolerance: Patient tolerated treatment well Patient left: in chair;with call bell/phone within reach;with chair alarm set;with SCD's reapplied Nurse Communication: Mobility status PT Visit Diagnosis: Unsteadiness on feet (R26.81);Other abnormalities of gait and mobility (R26.89);Muscle weakness (generalized)  (M62.81)     Time: 1010-1041 PT Time Calculation (min) (ACUTE ONLY): 31 min  Charges:    $Gait Training: 8-22 mins $Therapeutic Activity: 8-22 mins PT General Charges $$ ACUTE PT VISIT: 1 Visit                     Janell Axe, SPT

## 2024-06-18 NOTE — Plan of Care (Signed)

## 2024-06-18 NOTE — Progress Notes (Addendum)
 Subjective: 1 Day Post-Op Procedure(s) (LRB): ARTHROPLASTY, SHOULDER, TOTAL, REVERSE (Right) Patient reports pain as mild.   Patient is well, and has had no acute complaints or problems Denies any CP, SOB, N/V, fevers or chills No abdominal pain. Plan is to go to SNF after hospital stay.  Objective: Vital signs in last 24 hours: Temp:  [97 F (36.1 C)-98.5 F (36.9 C)] 98.1 F (36.7 C) (10/17 0735) Pulse Rate:  [76-85] 83 (10/17 0735) Resp:  [10-18] 17 (10/17 0735) BP: (116-121)/(73-86) 117/79 (10/17 0735) SpO2:  [96 %-100 %] 100 % (10/17 0735) Weight:  [71.5 kg] 71.5 kg (10/16 1756)  Intake/Output from previous day:  Intake/Output Summary (Last 24 hours) at 06/18/2024 1131 Last data filed at 06/18/2024 1032 Gross per 24 hour  Intake 701.99 ml  Output 1725 ml  Net -1023.01 ml    Intake/Output this shift: Total I/O In: 240 [P.O.:240] Out: 825 [Urine:825]  Labs: No results for input(s): HGB in the last 72 hours. No results for input(s): WBC, RBC, HCT, PLT in the last 72 hours. No results for input(s): NA, K, CL, CO2, BUN, CREATININE, GLUCOSE, CALCIUM in the last 72 hours. No results for input(s): LABPT, INR in the last 72 hours.  EXAM General - Patient is Alert, Appropriate, and Oriented Extremity - Neurologically intact Neurovascular intact Sensation intact distally Intact pulses distally No cellulitis present Compartment soft Able to flex and extend fingers without issue Dressing - dressing C/D/I and no drainage Motor Function - intact, moving foot and toes well on exam.   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[place])    Pneumonia 2023   Pneumonia due to respiratory syncytial virus (RSV) 09/2023   a.) intubated for mechanical ventilation form 09/13/2023 - 09/29/2023 (extubated to BiPAP)   Postoperative deep vein thrombosis (DVT) 02/26/2023   a.) proximal RIGHT femoral; b.) developed postoperatively (POD # 8) following shoulder arthroscopy for rotator cuff repair   Protein calorie malnutrition    Pulmonary nodule    Rotator cuff tear, right    Rotator cuff tendonitis, right    S/P percutaneous endoscopic gastrostomy (PEG) tube placement (HCC)    S/P right rotator cuff repair    Seizures (HCC)    as a teenager   Tobacco abuse     Assessment/Plan: 1 Day Post-Op Procedure(s) (LRB): ARTHROPLASTY, SHOULDER, TOTAL, REVERSE (Right) Principal Problem:   Status post reverse arthroplasty of shoulder, right  Estimated body mass index is 21.38 kg/m as calculated from the following:   Height as of this encounter: 6' (1.829 m).   Weight as of this encounter: 71.5 kg.  VSS with patient on at home O2 levels  Been working well with PT, will continue to work with PT and OT in rehab  Patient may utilize tylenol  and oxycodone  for as needed pain.  Continue to utilize regular ice application. TOC looking for SNF placement  DVT Prophylaxis: Eliquis  Patient will follow-up with [facility] in 2 weeks for staple removal and reevaluation  Fonda Koyanagi, PA-C [facility] 06/18/2024, 11:31 AM

## 2024-06-18 NOTE — Anesthesia Postprocedure Evaluation (Signed)
 Anesthesia Post Note  Patient: Santhosh Gulino  Procedure(s) Performed: ARTHROPLASTY, SHOULDER, TOTAL, REVERSE (Right: Shoulder)  Patient location during evaluation: PACU Anesthesia Type: General Level of consciousness: awake and alert Pain management: pain level controlled Vital Signs Assessment: post-procedure vital signs reviewed and stable Respiratory status: spontaneous breathing, nonlabored ventilation, respiratory function stable and patient connected to nasal cannula oxygen Cardiovascular status: blood pressure returned to baseline and stable Postop Assessment: no apparent nausea or vomiting Anesthetic complications: no   No notable events documented.   Last Vitals:  Vitals:   06/18/24 0415 06/18/24 0735  BP: 119/76 117/79  Pulse: 81 83  Resp: 18 17  Temp: 36.4 C 36.7 C  SpO2: 99% 100%    Last Pain:  Vitals:   06/18/24 0915  TempSrc:   PainSc: 8                  Lynwood KANDICE Clause

## 2024-06-19 LAB — GLUCOSE, CAPILLARY
Glucose-Capillary: 126 mg/dL — ABNORMAL HIGH (ref 70–99)
Glucose-Capillary: 147 mg/dL — ABNORMAL HIGH (ref 70–99)
Glucose-Capillary: 197 mg/dL — ABNORMAL HIGH (ref 70–99)
Glucose-Capillary: 102 mg/dL — ABNORMAL HIGH (ref 70–99)

## 2024-06-19 MED ORDER — POLYVINYL ALCOHOL 1.4 % OP SOLN
1.0000 [drp] | OPHTHALMIC | Status: DC | PRN
  Administered 2024-06-20: 1 [drp] via OPHTHALMIC
  Filled 2024-06-19: qty 15

## 2024-06-19 NOTE — Progress Notes (Signed)
 Physical Therapy Treatment Patient Details Name: Benjamin Bolton MRN: [id number] DOB: [birth year] Today's Date: 06/19/2024   History of Present Illness Pt is a 63 year old male s/p R Reverse right total shoulder arthroplasty.    PMH significant for  severe COPD and asthma diagnosis.    PT Comments  Pt received in bed, agreed to PT session. Supervision for supine<>sit, however relied heavily on side rail with HOB raised for bed mobility. Good sitting balance at EOB, Righ UE sling intact with c/c of pain. Pt completed gait training in hall without AD, occasional wall railing support upon fatiguing. No LOB on 4L O2 with SpO2 93% upon exertion. Functionally progressing well, requested back to bed for comfort upon completion of session. OT to see for pm session.    If plan is discharge home, recommend the following: A little help with walking and/or transfers;A lot of help with bathing/dressing/bathroom;Assistance with cooking/housework;Assist for transportation;Help with stairs or ramp for entrance   Can travel by private vehicle     Yes  Equipment Recommendations  Other (comment) (TBD)    Recommendations for Other Services       Precautions / Restrictions Precautions Precautions: Fall Recall of Precautions/Restrictions: Intact Required Braces or Orthoses: Sling Restrictions Weight Bearing Restrictions Per Provider Order: Yes RUE Weight Bearing Per Provider Order: Non weight bearing Other Position/Activity Restrictions: Post-op sling     Mobility  Bed Mobility Overal bed mobility: Needs Assistance Bed Mobility: Supine to Sit     Supine to sit: Supervision, HOB elevated, Used rails Sit to supine: Supervision, HOB elevated, Used rails   General bed mobility comments:  (Heavy use of bed rail to transition supine<>sit)    Transfers Overall transfer level: Needs assistance Equipment used: None Transfers: Sit to/from Stand Sit to Stand: Contact guard assist, From elevated  surface                Ambulation/Gait Ambulation/Gait assistance: Contact guard assist Gait Distance (Feet):  (120) Assistive device: None (Occasional use of wall side rail) Gait Pattern/deviations: Step-through pattern, Decreased stride length, Narrow base of support Gait velocity: decreased     General Gait Details:  (No LOB, standing rest break x 2 while on 4L O2 due to fatigue/SOB, SpO2 93% upon completion)   Stairs             Wheelchair Mobility     Tilt Bed    Modified Rankin (Stroke Patients Only)       Balance Overall balance assessment: Needs assistance Sitting-balance support: Feet supported Sitting balance-Leahy Scale: Good     Standing balance support: Single extremity supported Standing balance-Leahy Scale: Fair Standing balance comment: mild increased postural sway in standing                            Communication Communication Communication: No apparent difficulties  Cognition Arousal: Alert Behavior During Therapy: WFL for tasks assessed/performed   PT - Cognitive impairments: No apparent impairments                       PT - Cognition Comments: A&Ox4, pleasant Following commands: Intact      Cueing Cueing Techniques: Verbal cues, Visual cues  Exercises Other Exercises Other Exercises:  (Pt educated on role of acute PT and benefits of frequent mobility)    General Comments General comments (skin integrity, edema, etc.): R shoulder sling intact      Pertinent Vitals/Pain  Pain Assessment Pain Assessment: 0-10 Pain Score: 6  Pain Location: R shoulder Pain Descriptors / Indicators: Discomfort, Aching, Throbbing Pain Intervention(s): Monitored during session    Home Living                          Prior Function            PT Goals (current goals can now be found in the care plan section) Acute Rehab PT Goals Patient Stated Goal: to get better Progress towards PT goals: Progressing  toward goals    Frequency    BID      PT Plan      Co-evaluation              AM-PAC PT 6 Clicks Mobility   Outcome Measure  Help needed turning from your back to your side while in a flat bed without using bedrails?: A Little Help needed moving from lying on your back to sitting on the side of a flat bed without using bedrails?: A Little Help needed moving to and from a bed to a chair (including a wheelchair)?: A Little Help needed standing up from a chair using your arms (e.g., wheelchair or bedside chair)?: A Little Help needed to walk in hospital room?: A Little Help needed climbing 3-5 steps with a railing? : A Little 6 Click Score: 18    End of Session Equipment Utilized During Treatment: Gait belt;Oxygen Activity Tolerance: Patient tolerated treatment well Patient left: in chair;with call bell/phone within reach;with chair alarm set;with SCD's reapplied Nurse Communication: Mobility status PT Visit Diagnosis: Unsteadiness on feet (R26.81);Other abnormalities of gait and mobility (R26.89);Muscle weakness (generalized) (M62.81)     Time: 1250-1311 PT Time Calculation (min) (ACUTE ONLY): 21 min  Charges:    $Therapeutic Activity: 8-22 mins PT General Charges $$ ACUTE PT VISIT: 1 Visit                    Darice Bohr, PTA  Darice JAYSON Bohr 06/19/2024, 2:08 PM

## 2024-06-19 NOTE — Progress Notes (Signed)
 Occupational Therapy Treatment Patient Details Name: Benjamin Bolton MRN: [id number] DOB: May 18, 1961 Today's Date: 06/19/2024   History of present illness Pt is a 63 year old male s/p R Reverse right total shoulder arthroplasty.    PMH significant for  severe COPD and asthma diagnosis.   OT comments  Pt received semi-reclined in bed. Appearing alert; willing to work with OT on UB bathing and grooming. T/f supervision with extra time and HOB elevated to seated EOB. See flowsheet below for further details of session. Left semi-reclined in bed, bed alarm on, with all needs in reach.  Patient will benefit from continued OT while in acute care.       If plan is discharge home, recommend the following:  A little help with walking and/or transfers;A lot of help with bathing/dressing/bathroom   Equipment Recommendations  Tub/shower seat    Recommendations for Other Services      Precautions / Restrictions Precautions Precautions: Fall Recall of Precautions/Restrictions: Intact Required Braces or Orthoses: Sling Restrictions Weight Bearing Restrictions Per Provider Order: Yes RUE Weight Bearing Per Provider Order: Non weight bearing Other Position/Activity Restrictions: Post-op sling       Mobility Bed Mobility Overal bed mobility: Needs Assistance Bed Mobility: Supine to Sit, Sit to Supine     Supine to sit: Supervision, HOB elevated Sit to supine: Supervision        Transfers Overall transfer level: Needs assistance Equipment used: None Transfers: Sit to/from Stand Sit to Stand: Contact guard assist                 Balance Overall balance assessment: Needs assistance Sitting-balance support: Feet supported Sitting balance-Leahy Scale: Good     Standing balance support: No upper extremity supported Standing balance-Leahy Scale: Fair                             ADL either performed or assessed with clinical judgement   ADL Overall ADL's :  Needs assistance/impaired     Grooming: Set up;Standing;Supervision/safety Grooming Details (indicate cue type and reason): standing at sink; pt using R hand in sling as stabilizer for toothbrush, using L hand to place toothbrush on toothpaste; brushing teeth with L hand; rinsing with cup in L hand. Upper Body Bathing: Maximal assistance;Sitting Upper Body Bathing Details (indicate cue type and reason): seated EOB, OT assisted pt with UB bathing of R arm and axilla (at pt's preference; he was fearful of washign it himself), L arm and axilla, and back; pt able to wash chest. Lower Body Bathing:  (not attempted today)   Upper Body Dressing : Maximal assistance;Sitting Upper Body Dressing Details (indicate cue type and reason): OT assisted pt to change gown; use of snaps on shoulders. Pt dependent for doffing/donning of polarice and sling, although he did assist with L hand as able.                 Functional mobility during ADLs: Supervision/safety General ADL Comments: No AD during mobility to sink today; no loss of balance.    Extremity/Trunk Assessment Upper Extremity Assessment Upper Extremity Assessment: Right hand dominant;RUE deficits/detail RUE Deficits / Details: R shoulder reverse TSA   Lower Extremity Assessment Lower Extremity Assessment: Overall WFL for tasks assessed        Vision       Perception     Praxis     Communication Communication Communication: No apparent difficulties   Cognition Arousal: Alert Behavior  During Therapy: WFL for tasks assessed/performed Cognition: No apparent impairments                               Following commands: Intact        Cueing   Cueing Techniques: Verbal cues, Visual cues  Exercises      Shoulder Instructions       General Comments Pt on O2 throughout session.    Pertinent Vitals/ Pain       Pain Assessment Pain Assessment: 0-10 Pain Score: 6  Pain Location: R shoulder Pain Descriptors  / Indicators: Discomfort, Aching, Throbbing Pain Intervention(s): Limited activity within patient's tolerance  Home Living                                          Prior Functioning/Environment              Frequency  Min 3X/week        Progress Toward Goals  OT Goals(current goals can now be found in the care plan section)  Progress towards OT goals: Progressing toward goals  Acute Rehab OT Goals Patient Stated Goal: Get better OT Goal Formulation: With patient Time For Goal Achievement: 07/01/24 Potential to Achieve Goals: Good ADL Goals Pt Will Perform Grooming: sitting;with modified independence Pt Will Perform Upper Body Dressing: with modified independence;sitting Pt Will Perform Lower Body Dressing: with modified independence;sitting/lateral leans;sit to/from stand Pt Will Transfer to Toilet: with modified independence;ambulating Pt Will Perform Toileting - Clothing Manipulation and hygiene: with modified independence;sitting/lateral leans;sit to/from stand  Plan      Co-evaluation                 AM-PAC OT 6 Clicks Daily Activity     Outcome Measure   Help from another person eating meals?: None Help from another person taking care of personal grooming?: None Help from another person toileting, which includes using toliet, bedpan, or urinal?: A Little Help from another person bathing (including washing, rinsing, drying)?: A Lot Help from another person to put on and taking off regular upper body clothing?: Total Help from another person to put on and taking off regular lower body clothing?: A Lot 6 Click Score: 16    End of Session    OT Visit Diagnosis: Other abnormalities of gait and mobility (R26.89)   Activity Tolerance Patient tolerated treatment well   Patient Left in bed;with call bell/phone within reach;with bed alarm set   Nurse Communication Mobility status        Time: 1326-1400 OT Time Calculation (min):  34 min  Charges: OT General Charges $OT Visit: 1 Visit OT Treatments $Self Care/Home Management : 23-37 mins  Jasmine Arlean Shams, MS, OTR/L  Jasmine Shams 06/19/2024, 3:32 PM

## 2024-06-19 NOTE — Plan of Care (Signed)

## 2024-06-19 NOTE — Plan of Care (Signed)

## 2024-06-20 DIAGNOSIS — M75121 Complete rotator cuff tear or rupture of right shoulder, not specified as traumatic: Secondary | ICD-10-CM | POA: Diagnosis present

## 2024-06-20 DIAGNOSIS — Z833 Family history of diabetes mellitus: Secondary | ICD-10-CM | POA: Diagnosis not present

## 2024-06-20 DIAGNOSIS — Z7951 Long term (current) use of inhaled steroids: Secondary | ICD-10-CM | POA: Diagnosis not present

## 2024-06-20 DIAGNOSIS — Z7901 Long term (current) use of anticoagulants: Secondary | ICD-10-CM | POA: Diagnosis not present

## 2024-06-20 DIAGNOSIS — M19011 Primary osteoarthritis, right shoulder: Secondary | ICD-10-CM | POA: Diagnosis present

## 2024-06-20 DIAGNOSIS — Z9889 Other specified postprocedural states: Secondary | ICD-10-CM | POA: Diagnosis present

## 2024-06-20 DIAGNOSIS — J4489 Other specified chronic obstructive pulmonary disease: Secondary | ICD-10-CM | POA: Diagnosis present

## 2024-06-20 DIAGNOSIS — J432 Centrilobular emphysema: Secondary | ICD-10-CM | POA: Diagnosis present

## 2024-06-20 DIAGNOSIS — M87811 Other osteonecrosis, right shoulder: Secondary | ICD-10-CM | POA: Diagnosis present

## 2024-06-20 DIAGNOSIS — K219 Gastro-esophageal reflux disease without esophagitis: Secondary | ICD-10-CM | POA: Diagnosis present

## 2024-06-20 DIAGNOSIS — Z9981 Dependence on supplemental oxygen: Secondary | ICD-10-CM | POA: Diagnosis not present

## 2024-06-20 DIAGNOSIS — Z87891 Personal history of nicotine dependence: Secondary | ICD-10-CM | POA: Diagnosis not present

## 2024-06-20 DIAGNOSIS — Z79899 Other long term (current) drug therapy: Secondary | ICD-10-CM | POA: Diagnosis not present

## 2024-06-20 DIAGNOSIS — E119 Type 2 diabetes mellitus without complications: Secondary | ICD-10-CM | POA: Diagnosis present

## 2024-06-20 DIAGNOSIS — Z86718 Personal history of other venous thrombosis and embolism: Secondary | ICD-10-CM | POA: Diagnosis not present

## 2024-06-20 DIAGNOSIS — E785 Hyperlipidemia, unspecified: Secondary | ICD-10-CM | POA: Diagnosis present

## 2024-06-20 LAB — CBC
HCT: 31.4 % — ABNORMAL LOW (ref 39.0–52.0)
Hemoglobin: 10.1 g/dL — ABNORMAL LOW (ref 13.0–17.0)
MCH: 30.2 pg (ref 26.0–34.0)
MCHC: 32.2 g/dL (ref 30.0–36.0)
MCV: 94 fL (ref 80.0–100.0)
Platelets: 201 K/uL (ref 150–400)
RBC: 3.34 MIL/uL — ABNORMAL LOW (ref 4.22–5.81)
RDW: 15 % (ref 11.5–15.5)
WBC: 10 K/uL (ref 4.0–10.5)
nRBC: 0 % (ref 0.0–0.2)

## 2024-06-20 LAB — BASIC METABOLIC PANEL WITH GFR
Anion gap: 10 (ref 5–15)
BUN: 17 mg/dL (ref 8–23)
CO2: 31 mmol/L (ref 22–32)
Calcium: 8.1 mg/dL — ABNORMAL LOW (ref 8.9–10.3)
Chloride: 96 mmol/L — ABNORMAL LOW (ref 98–111)
Creatinine, Ser: 0.81 mg/dL (ref 0.61–1.24)
GFR, Estimated: >60 mL/min (ref >60)
Glucose, Bld: 259 mg/dL — ABNORMAL HIGH (ref 70–99)
Potassium: 4.6 mmol/L (ref 3.5–5.1)
Sodium: 137 mmol/L (ref 135–145)

## 2024-06-20 LAB — GLUCOSE, CAPILLARY
Glucose-Capillary: 130 mg/dL — ABNORMAL HIGH (ref 70–99)
Glucose-Capillary: 218 mg/dL — ABNORMAL HIGH (ref 70–99)
Glucose-Capillary: 228 mg/dL — ABNORMAL HIGH (ref 70–99)
Glucose-Capillary: 100 mg/dL — ABNORMAL HIGH (ref 70–99)

## 2024-06-20 MED ORDER — SODIUM CHLORIDE 0.9 % IV BOLUS
500.0000 mL | Freq: Once | INTRAVENOUS | Status: AC
  Administered 2024-06-20: 500 mL via INTRAVENOUS

## 2024-06-20 MED ORDER — TIZANIDINE HCL 4 MG PO TABS
4.0000 mg | ORAL_TABLET | Freq: Three times a day (TID) | ORAL | Status: DC | PRN
  Administered 2024-06-20: 4 mg via ORAL
  Filled 2024-06-20: qty 1

## 2024-06-20 MED ORDER — ACETAMINOPHEN-CAFFEINE 500-65 MG PO TABS
1.0000 | ORAL_TABLET | Freq: Once | ORAL | Status: AC
  Administered 2024-06-20: 1 via ORAL
  Filled 2024-06-20: qty 1

## 2024-06-20 NOTE — Plan of Care (Signed)
   Problem: Education: Goal: Knowledge of General Education information will improve Description Including pain rating scale, medication(s)/side effects and non-pharmacologic comfort measures Outcome: Progressing   Problem: Activity: Goal: Risk for activity intolerance will decrease Outcome: Progressing   Problem: Safety: Goal: Ability to remain free from injury will improve Outcome: Progressing

## 2024-06-20 NOTE — Progress Notes (Signed)
 Subjective: 3 Days Post-Op Procedure(s) (LRB): ARTHROPLASTY, SHOULDER, TOTAL, REVERSE (Right) Patient reports pain as more moderate this AM.   Patient is well, and has had no acute complaints or problems Denies any CP, SOB, N/V, fevers or chills Does state he has had dry eyes No abdominal pain. Still working on [facility] Plan is to go to SNF after hospital stay.  Objective: Vital signs in last 24 hours: Temp:  [98.6 F (37 C)-99.9 F (37.7 C)] 98.6 F (37 C) (10/19 0807) Pulse Rate:  [87-97] 87 (10/19 0807) Resp:  [16-18] 16 (10/19 0807) BP: (101-123)/(63-80) 110/73 (10/19 0807) SpO2:  [98 %-100 %] 100 % (10/19 0807)  Intake/Output from previous day:  Intake/Output Summary (Last 24 hours) at 06/20/2024 1236 Last data filed at 06/20/2024 0900 Gross per 24 hour  Intake 720 ml  Output 450 ml  Net 270 ml    Intake/Output this shift: Total I/O In: 240 [P.O.:240] Out: -   Labs: No results for input(s): HGB in the last 72 hours. No results for input(s): WBC, RBC, HCT, PLT in the last 72 hours. No results for input(s): NA, K, CL, CO2, BUN, CREATININE, GLUCOSE, CALCIUM in the last 72 hours. No results for input(s): LABPT, INR in the last 72 hours.  EXAM General - Patient is Alert, Appropriate, and Oriented Extremity - Neurologically intact Neurovascular intact Sensation intact distally Intact pulses distally No cellulitis present Compartment soft Able to flex and extend fingers without issue Dressing - dressing C/D/I and no drainage Motor Function - intact, moving foot and toes well on exam.   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[place])    Pneumonia 2023   Pneumonia due to respiratory syncytial virus (RSV) 09/2023   a.) intubated for mechanical ventilation form 09/13/2023 - 09/29/2023 (extubated to BiPAP)   Postoperative deep vein thrombosis (DVT) 02/26/2023   a.) proximal RIGHT femoral; b.) developed postoperatively (POD # 8) following shoulder arthroscopy for rotator cuff repair   Protein calorie malnutrition    Pulmonary nodule    Rotator cuff tear, right    Rotator cuff tendonitis, right    S/P percutaneous endoscopic gastrostomy (PEG) tube placement (HCC)    S/P right rotator cuff repair    Seizures (HCC)    as a teenager   Tobacco abuse     Assessment/Plan: 3 Days Post-Op Procedure(s) (LRB): ARTHROPLASTY, SHOULDER, TOTAL, REVERSE (Right) Principal Problem:   Status post reverse arthroplasty of shoulder, right  Estimated body mass index is 21.38 kg/m as calculated from the following:   Height as of this encounter: 6' (1.829 m).   Weight as of this encounter: 71.5 kg.  VSS with patient on at home O2 levels  Patient states he was having some muscle spasms in his shoulder and a migraine this AM. Tizanidine added along with excedrin to help with his underlying issues, continue to monitor.  Been working well with PT, will continue to work with PT and OT in rehab. Suggested limitations secondary to O2, safety at home with daily ADLs  Patient may utilize tylenol  and oxycodone  for as needed pain.  Continue to monitor for BM  Continue to utilize regular ice application. TOC looking for SNF placement  DVT Prophylaxis: Eliquis  and SCDs Patient will follow-up with [facility] in 2 weeks for staple removal and reevaluation  Fonda Koyanagi, PA-C [facility] 06/20/2024, 12:36 PM

## 2024-06-20 NOTE — Progress Notes (Signed)
 Physical Therapy Treatment Patient Details Name: Newt Levingston MRN: [id number] DOB: December 07, 1960 Today's Date: 06/20/2024   History of Present Illness Pt is a 63 year old male s/p R Reverse right total shoulder arthroplasty.    PMH significant for  severe COPD and asthma diagnosis.    PT Comments  Pt ready for session.  Reports some headache today.  He is able to get to EOB with min a x 1 and HOB raised. Stands and progresses gait x 1 lap on unit with slow cautious gait.  Mild deficits noted but no outside assist.  He does need assist to manage O2 tank and cording to prevent tangling and falling.  He uses 3 lpm at baseline and increases to 4 lpm with activity at home and was also done during session.  Sats 90% after gait and HR 116  Improves with rest.  Overall progressing well but stated he has poor support at home for ADL tasks, mobility and managing is O2 needs.  Given headache he declined further activity.   If plan is discharge home, recommend the following: A little help with walking and/or transfers;A lot of help with bathing/dressing/bathroom;Assistance with cooking/housework;Assist for transportation;Help with stairs or ramp for entrance   Can travel by private vehicle        Equipment Recommendations       Recommendations for Other Services       Precautions / Restrictions Precautions Precautions: Fall Recall of Precautions/Restrictions: Intact Required Braces or Orthoses: Sling Restrictions Weight Bearing Restrictions Per Provider Order: Yes RUE Weight Bearing Per Provider Order: Non weight bearing Other Position/Activity Restrictions: Post-op sling     Mobility  Bed Mobility Overal bed mobility: Needs Assistance Bed Mobility: Supine to Sit, Sit to Supine     Supine to sit: Used rails, HOB elevated, Min assist Sit to supine: HOB elevated, Used rails, Supervision     Patient Response: Cooperative  Transfers Overall transfer level: Needs  assistance Equipment used: None Transfers: Sit to/from Stand Sit to Stand: Contact guard assist                Ambulation/Gait Ambulation/Gait assistance: Contact guard assist Gait Distance (Feet): 160 Feet Assistive device: None Gait Pattern/deviations: Step-through pattern, Decreased stride length, Narrow base of support Gait velocity: decreased     General Gait Details: slow cautious gait with assist to manage O2 tank and cording   Stairs             Wheelchair Mobility     Tilt Bed Tilt Bed Patient Response: Cooperative  Modified Rankin (Stroke Patients Only)       Balance Overall balance assessment: Needs assistance Sitting-balance support: Feet supported Sitting balance-Leahy Scale: Good     Standing balance support: No upper extremity supported Standing balance-Leahy Scale: Fair Standing balance comment: mild increased postural sway in standing                            Communication Communication Communication: No apparent difficulties  Cognition Arousal: Alert Behavior During Therapy: WFL for tasks assessed/performed                           PT - Cognition Comments: A&Ox4, pleasant Following commands: Intact      Cueing Cueing Techniques: Verbal cues, Visual cues  Exercises      General Comments        Pertinent Vitals/Pain Pain Assessment Pain  Assessment: Faces Faces Pain Scale: Hurts little more Pain Location: R shoulder and headache Pain Descriptors / Indicators: Discomfort, Aching, Throbbing Pain Intervention(s): Limited activity within patient's tolerance, Monitored during session, Premedicated before session, Repositioned, Ice applied    Home Living                          Prior Function            PT Goals (current goals can now be found in the care plan section) Progress towards PT goals: Progressing toward goals    Frequency    BID      PT Plan      Co-evaluation               AM-PAC PT 6 Clicks Mobility   Outcome Measure  Help needed turning from your back to your side while in a flat bed without using bedrails?: A Little Help needed moving from lying on your back to sitting on the side of a flat bed without using bedrails?: A Little Help needed moving to and from a bed to a chair (including a wheelchair)?: A Little Help needed standing up from a chair using your arms (e.g., wheelchair or bedside chair)?: A Little Help needed to walk in hospital room?: A Little Help needed climbing 3-5 steps with a railing? : A Little 6 Click Score: 18    End of Session Equipment Utilized During Treatment: Gait belt;Oxygen Activity Tolerance: Patient tolerated treatment well Patient left: in bed;with bed alarm set;with call bell/phone within reach Nurse Communication: Mobility status PT Visit Diagnosis: Unsteadiness on feet (R26.81);Other abnormalities of gait and mobility (R26.89);Muscle weakness (generalized) (M62.81)     Time: 0950-1006 PT Time Calculation (min) (ACUTE ONLY): 16 min  Charges:    $Gait Training: 8-22 mins PT General Charges $$ ACUTE PT VISIT: 1 Visit                    Lauraine Gills, PTA 06/20/24, 10:17 AM

## 2024-06-21 ENCOUNTER — Encounter: Payer: Self-pay | Admitting: Surgery

## 2024-06-21 LAB — GLUCOSE, CAPILLARY
Glucose-Capillary: 163 mg/dL — ABNORMAL HIGH (ref 70–99)
Glucose-Capillary: 202 mg/dL — ABNORMAL HIGH (ref 70–99)
Glucose-Capillary: 152 mg/dL — ABNORMAL HIGH (ref 70–99)
Glucose-Capillary: 169 mg/dL — ABNORMAL HIGH (ref 70–99)

## 2024-06-21 MED ORDER — OXYCODONE HCL 5 MG PO TABS: 5.0000 mg | ORAL_TABLET | Freq: Four times a day (QID) | ORAL | 0 refills | Status: AC | PRN

## 2024-06-21 MED ORDER — TIZANIDINE HCL 4 MG PO TABS: 4.0000 mg | ORAL_TABLET | Freq: Three times a day (TID) | ORAL | 0 refills | Status: AC | PRN

## 2024-06-21 NOTE — Progress Notes (Signed)
 Physical Therapy Treatment Patient Details Name: Benjamin Bolton MRN: [id number] DOB: 1960/11/02 Today's Date: 06/21/2024   History of Present Illness Pt is a 63 year old male s/p R Reverse right total shoulder arthroplasty.    PMH significant for  severe COPD and asthma diagnosis.    PT Comments  Ready for session. He is able to transition supine/sit with rails and mod I.  Steady in sitting and assist is given to change gown and adjust sling per his request.  He is able to walk x 1 lap on unit with slow and generally unsteady gait but no outside intervention needed.  He is unable to manage O2 tank and needs assist for cording and pushing tank.  Discussed use of SPC for balance but he declined stating he is unable to manage O2 tank with SPC and is scared it will slide out from under me.  Education provided and he is encouraged to consider it while he recovers and assist is available to assist with O2.  Pt is a bit more SOB today and stated he has not had a breathing treatment today.  Respiratory on unit and aware of his request for one.  Pt returned to supine with needs met.   If plan is discharge home, recommend the following: A little help with walking and/or transfers;A lot of help with bathing/dressing/bathroom;Assistance with cooking/housework;Assist for transportation;Help with stairs or ramp for entrance   Can travel by private vehicle        Equipment Recommendations       Recommendations for Other Services       Precautions / Restrictions Precautions Precautions: Fall Recall of Precautions/Restrictions: Intact Required Braces or Orthoses: Sling Restrictions Weight Bearing Restrictions Per Provider Order: Yes RUE Weight Bearing Per Provider Order: Non weight bearing Other Position/Activity Restrictions: Post-op sling     Mobility  Bed Mobility Overal bed mobility: Modified Independent               Patient Response: Cooperative  Transfers Overall transfer  level: Needs assistance Equipment used: None                    Ambulation/Gait Ambulation/Gait assistance: Contact guard assist Gait Distance (Feet): 160 Feet Assistive device: None Gait Pattern/deviations: Step-through pattern, Decreased stride length, Narrow base of support Gait velocity: decreased     General Gait Details: slow cautious gait with assist to manage O2 tank and cording   Stairs             Wheelchair Mobility     Tilt Bed Tilt Bed Patient Response: Cooperative  Modified Rankin (Stroke Patients Only)       Balance Overall balance assessment: Needs assistance Sitting-balance support: Feet supported Sitting balance-Leahy Scale: Good     Standing balance support: No upper extremity supported Standing balance-Leahy Scale: Fair Standing balance comment: mild increased postural sway in standing                            Communication Communication Communication: No apparent difficulties  Cognition Arousal: Alert Behavior During Therapy: WFL for tasks assessed/performed   PT - Cognitive impairments: No apparent impairments                       PT - Cognition Comments: A&Ox4, pleasant Following commands: Intact      Cueing Cueing Techniques: Verbal cues, Visual cues  Exercises Other Exercises Other Exercises: assisted with  changing gowns and adjusting sling    General Comments General comments (skin integrity, edema, etc.): 3.5L via [place] throughout session, VSS      Pertinent Vitals/Pain Pain Assessment Pain Assessment: Faces Faces Pain Scale: Hurts little more Pain Location: R shoulder and headache Pain Descriptors / Indicators: Discomfort, Aching Pain Intervention(s): Monitored during session, Repositioned, Limited activity within patient's tolerance, Ice applied    Home Living                          Prior Function            PT Goals (current goals can now be found in the care plan  section) Progress towards PT goals: Progressing toward goals    Frequency    BID      PT Plan      Co-evaluation              AM-PAC PT 6 Clicks Mobility   Outcome Measure  Help needed turning from your back to your side while in a flat bed without using bedrails?: None Help needed moving from lying on your back to sitting on the side of a flat bed without using bedrails?: None Help needed moving to and from a bed to a chair (including a wheelchair)?: A Little Help needed standing up from a chair using your arms (e.g., wheelchair or bedside chair)?: A Little Help needed to walk in hospital room?: A Little Help needed climbing 3-5 steps with a railing? : A Little 6 Click Score: 20    End of Session Equipment Utilized During Treatment: Gait belt;Oxygen Activity Tolerance: Patient tolerated treatment well Patient left: in bed;with bed alarm set;with call bell/phone within reach Nurse Communication: Mobility status PT Visit Diagnosis: Unsteadiness on feet (R26.81);Other abnormalities of gait and mobility (R26.89);Muscle weakness (generalized) (M62.81)     Time: 8672-8649 PT Time Calculation (min) (ACUTE ONLY): 23 min  Charges:    $Gait Training: 8-22 mins $Therapeutic Activity: 8-22 mins PT General Charges $$ ACUTE PT VISIT: 1 Visit                    Lauraine Gills, PTA 06/21/24, 2:02 PM

## 2024-06-21 NOTE — Progress Notes (Signed)
 Occupational Therapy Treatment Patient Details Name: Benjamin Bolton MRN: [id number] DOB: 1960-12-17 Today's Date: 06/21/2024   History of present illness Pt is a 63 year old male s/p R Reverse right total shoulder arthroplasty.    PMH significant for  severe COPD and asthma diagnosis.   OT comments  Pt seen for OT treatment on this date. Upon arrival to room pt semi supine in bed, agreeable to tx. Pt completed MODI for bed mobility, multiple STS from lowest bed height during bathing task, supervision for standing. Pt required MODA for dressing of UE including sling management and gown donning/doffing. Reviewed with pt; polar care mgt, sling/immobilizer mgt, ROM exercises for RUE (with instructions for no shoulder exercises until full sensation has returned), RUE precautions and adaptive strategies for bathing/dressing/toileting/grooming. OT adjusted sling/immobilizer and polar care to improve comfort, optimize positioning, and to maximize skin integrity/safety. Pt verbalized understanding of all education/training provided. Pt making good progress toward goals, will continue to follow POC. Discharge recommendation remains appropriate.        If plan is discharge home, recommend the following:  A little help with walking and/or transfers;A lot of help with bathing/dressing/bathroom   Equipment Recommendations  Tub/shower seat    Recommendations for Other Services      Precautions / Restrictions Precautions Precautions: Fall Recall of Precautions/Restrictions: Intact Required Braces or Orthoses: Sling Restrictions Weight Bearing Restrictions Per Provider Order: Yes RUE Weight Bearing Per Provider Order: Non weight bearing Other Position/Activity Restrictions: Post-op sling       Mobility Bed Mobility Overal bed mobility: Modified Independent                  Transfers Overall transfer level: Needs assistance Equipment used: None Transfers: Sit to/from Stand Sit to  Stand: Supervision           General transfer comment: Multiple STS from EOB during bathing tasks, no physical assist to complete safely     Balance Overall balance assessment: Needs assistance Sitting-balance support: Feet supported Sitting balance-Leahy Scale: Good Sitting balance - Comments: steady static and dynamic sitting   Standing balance support: No upper extremity supported Standing balance-Leahy Scale: Fair                             ADL either performed or assessed with clinical judgement   ADL Overall ADL's : Needs assistance/impaired Eating/Feeding: Set up;Sitting   Grooming: Wash/dry hands;Wash/dry face;Applying deodorant;Sitting;Set up;Minimal assistance Grooming Details (indicate cue type and reason): MINA Applying deo         Upper Body Dressing : Moderate assistance;Sitting                   Functional mobility during ADLs: Supervision/safety General ADL Comments: STS during bathing task at the EOB, supervision in standing    Extremity/Trunk Assessment              Vision       Perception     Praxis     Communication Communication Communication: No apparent difficulties   Cognition Arousal: Alert Behavior During Therapy: WFL for tasks assessed/performed Cognition: No apparent impairments             OT - Cognition Comments: A/Ox4                 Following commands: Intact        Cueing   Cueing Techniques: Verbal cues, Visual cues  Exercises Exercises: Other  exercises Other Exercises Other Exercises: Edu: Role of OT, UE dressing, sling/polar care management    Shoulder Instructions       General Comments 3.5L via [place] throughout session, VSS    Pertinent Vitals/ Pain       Pain Assessment Pain Assessment: 0-10 Pain Score: 7  Pain Intervention(s): Limited activity within patient's tolerance, Repositioned  Home Living                                          Prior  Functioning/Environment              Frequency  Min 3X/week        Progress Toward Goals  OT Goals(current goals can now be found in the care plan section)  Progress towards OT goals: Progressing toward goals  Acute Rehab OT Goals OT Goal Formulation: With patient Time For Goal Achievement: 07/01/24 Potential to Achieve Goals: Good ADL Goals Pt Will Perform Grooming: sitting;with modified independence Pt Will Perform Upper Body Dressing: with modified independence;sitting Pt Will Perform Lower Body Dressing: with modified independence;sitting/lateral leans;sit to/from stand Pt Will Transfer to Toilet: with modified independence;ambulating Pt Will Perform Toileting - Clothing Manipulation and hygiene: with modified independence;sitting/lateral leans;sit to/from stand  Plan      Co-evaluation                 AM-PAC OT 6 Clicks Daily Activity     Outcome Measure   Help from another person eating meals?: None Help from another person taking care of personal grooming?: None Help from another person toileting, which includes using toliet, bedpan, or urinal?: A Little Help from another person bathing (including washing, rinsing, drying)?: A Lot Help from another person to put on and taking off regular upper body clothing?: A Little Help from another person to put on and taking off regular lower body clothing?: A Lot 6 Click Score: 18    End of Session Equipment Utilized During Treatment: Oxygen;Other (comment) (Sling)  OT Visit Diagnosis: Other abnormalities of gait and mobility (R26.89)   Activity Tolerance Patient tolerated treatment well   Patient Left in bed;with call bell/phone within reach;with bed alarm set   Nurse Communication Mobility status        Time: 8987-8941 OT Time Calculation (min): 46 min  Charges: OT General Charges $OT Visit: 1 Visit OT Treatments $Self Care/Home Management : 23-37 mins $Therapeutic Activity: 8-22 mins  Larraine Colas M.S. OTR/L  06/21/24, 11:17 AM

## 2024-06-21 NOTE — Discharge Summary (Signed)
 Physician Discharge Summary  Patient ID: Monnie Anspach MRN: [id number] DOB/AGE: 11/01/1960 63 y.o.  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]   Discharge Diagnoses: Patient Active Problem List   Diagnosis Date Noted   Status post reverse arthroplasty of shoulder, right 06/17/2024   Pain due to onychomycosis of toenails of both feet 12/01/2023   Fever 10/14/2023   Diarrhea 10/12/2023   Electrolyte abnormality 10/08/2023   Normochromic anemia 10/08/2023   Elevated troponin 10/08/2023   Pre-diabetes    Malnutrition of moderate degree 09/15/2023   COPD exacerbation (HCC) 09/10/2023   Protein-calorie malnutrition, severe 09/10/2023   Acute on chronic respiratory failure with hypoxia (HCC) 09/10/2023   UTI (urinary tract infection) 09/10/2023   DVT (deep venous thrombosis) (HCC)    Tobacco abuse    HLD (hyperlipidemia)    Status post right rotator cuff repair 02/18/2023    Past Medical History:  Diagnosis Date   Anticoagulated on apixaban     Aortic atherosclerosis    Asthma    COPD (chronic obstructive pulmonary disease) (HCC)    DM (diabetes mellitus), type 2 (HCC)    Dyspnea    GERD (gastroesophageal reflux disease)    HLD (hyperlipidemia)    Hoarseness of voice    a.) following extended intubation (09/13/2023 - 01/27-2025) for RSV PNA   Hx of migraines    Normochromic anemia    Oxygen dependent (3L/[place])    Pneumonia 2023   Pneumonia due to respiratory syncytial virus (RSV) 09/2023   a.) intubated for mechanical ventilation form 09/13/2023 - 09/29/2023 (extubated to BiPAP)   Postoperative deep vein thrombosis (DVT) 02/26/2023    a.) proximal RIGHT femoral; b.) developed postoperatively (POD # 8) following shoulder arthroscopy for rotator cuff repair   Protein calorie malnutrition    Pulmonary nodule    Rotator cuff tear, right    Rotator cuff tendonitis, right    S/P percutaneous endoscopic gastrostomy (PEG) tube placement (HCC)    S/P right rotator cuff repair    Seizures (HCC)    as a teenager   Tobacco abuse      Transfusion: none   Consultants (if any):   Discharged Condition: Improved  Hospital Course: Yani Lal is an 63 y.o. male who was admitted 06/17/2024 with a diagnosis of Status post reverse arthroplasty of shoulder, right and went to the operating room on 06/17/2024 and underwent the above named procedures.    Surgeries: Procedure(s): ARTHROPLASTY, SHOULDER, TOTAL, REVERSE on 06/17/2024 Patient tolerated the surgery well. Taken to PACU where she was stabilized and then transferred to the orthopedic floor.  Started on Eliquis , TEDs and SCDs applied bilaterally. Heels elevated on bed. No evidence of DVT. Negative Homan. Physical therapy started on day #1 for gait training and transfer. OT started day #1 for ADL and assisted devices.  Slow progress with PT. Pain well controlled. Patient medically stable on POD1. Patient pending SNF placement until POD#6.  On post op day #6 patient was stable and ready for discharge to SNF.  Implants: All press-fit Arthrex system with a #11 [employer] humeral stem, a 39 mm SutureCup with a +3 mm insert, and a 24 mm base plate with a 39 mm +4 mm lateralized glenosphere.   He was given  perioperative antibiotics:  Anti-infectives (From admission, onward)    Start     Dose/Rate Route Frequency Ordered Stop   06/17/24 1400  ceFAZolin  (ANCEF ) IVPB 2g/100 mL premix        2 g 200 mL/hr over 30 Minutes Intravenous Every 6 hours 06/17/24 1351 06/18/24 0800   06/17/24 0615  ceFAZolin  (ANCEF ) IVPB 2g/100 mL premix        2 g 200 mL/hr over 30 Minutes  Intravenous On call to O.R. 06/17/24 [id number] 06/17/24 0825     .  He was given sequential compression devices, early ambulation, and Eliquis  for DVT prophylaxis.  He benefited maximally from the hospital stay and there were no complications.    Recent vital signs:  Vitals:   06/21/24 0502 06/21/24 0832  BP: 120/87 102/75  Pulse: 87 (!) 105  Resp: 18 18  Temp: 98.3 F (36.8 C) 98.7 F (37.1 C)  SpO2: 98% 97%    Recent laboratory studies:  Lab Results  Component Value Date   HGB 10.1 (L) 06/20/2024   HGB 9.8 (L) 11/15/2023   HGB 10.1 (L) 11/01/2023   Lab Results  Component Value Date   WBC 10.0 06/20/2024   PLT 201 06/20/2024   Lab Results  Component Value Date   INR 1.3 (H) 09/26/2023   Lab Results  Component Value Date   NA 137 06/20/2024   K 4.6 06/20/2024   CL 96 (L) 06/20/2024   CO2 31 06/20/2024   BUN 17 06/20/2024   CREATININE 0.81 06/20/2024   GLUCOSE 259 (H) 06/20/2024    Discharge Medications:   Allergies as of 06/21/2024   No Known Allergies      Medication List     STOP taking these medications    oxycodone  5 MG capsule Commonly known as: OXY-IR Replaced by: oxyCODONE  5 MG immediate release tablet       TAKE these medications    acetaminophen  325 MG tablet Commonly known as: TYLENOL  Take 2 tablets (650 mg total) by mouth every 6 (six) hours as needed for mild pain (pain score 1-3) or fever.   albuterol  108 (90 Base) MCG/ACT inhaler Commonly known as: VENTOLIN  HFA Inhale 1 puff into the lungs every 4 (four) hours as needed for wheezing or shortness of breath.   albuterol  (2.5 MG/3ML) 0.083% nebulizer solution Commonly known as: PROVENTIL  USE 1 VIAL IN NEBULIZER EVERY 6 HOURS AS NEEDED FOR WHEEZING   apixaban  5 MG Tabs tablet Commonly known as: ELIQUIS  Take 1 tablet (5 mg total) by mouth 2 (two) times daily.   ascorbic acid  500 MG tablet Commonly known as: VITAMIN C  Take 1 tablet (500 mg total) by mouth 2 (two) times daily.    gabapentin  300 MG capsule Commonly known as: NEURONTIN  Take 1 capsule (300 mg total) by mouth 3 (three) times daily.   guaiFENesin  600 MG 12 hr tablet Commonly known as: MUCINEX  Take 1 tablet (600 mg total) by mouth 2 (two) times daily as needed for cough or to loosen phlegm.   iron  polysaccharides 150 MG capsule Commonly known as: NIFEREX Take 1 capsule (150 mg total) by mouth daily.   midodrine  5 MG tablet Commonly known as: PROAMATINE  Take 1 tablet (5 mg total) by mouth 3 (three) times daily with meals.   multivitamin with minerals Tabs tablet Take 1 tablet by mouth daily.   nicotine  14 mg/24hr patch Commonly known as: NICODERM CQ  - dosed in mg/24 hours Place 1 patch (14 mg total) onto the skin  daily. What changed:  when to take this reasons to take this   omeprazole 20 MG tablet Commonly known as: PRILOSEC OTC Take 20 mg by mouth every morning.   ondansetron  4 MG tablet Commonly known as: ZOFRAN  Take 1 tablet (4 mg total) by mouth every 6 (six) hours as needed for nausea.   oxybutynin  5 MG tablet Commonly known as: DITROPAN  Take 1 tablet (5 mg total) by mouth every 8 (eight) hours as needed for bladder spasms.   oxyCODONE  5 MG immediate release tablet Commonly known as: Oxy IR/ROXICODONE  Take 1-2 tablets (5-10 mg total) by mouth every 6 (six) hours as needed for moderate pain (pain score 4-6). Replaces: oxycodone  5 MG capsule   OXYGEN Inhale 3 L into the lungs continuous.   predniSONE  20 MG tablet Commonly known as: DELTASONE  Take 20 mg by mouth daily with breakfast.   tamsulosin  0.4 MG Caps capsule Commonly known as: Flomax  Take 1 capsule (0.4 mg total) by mouth daily. What changed: when to take this   tiZANidine 4 MG tablet Commonly known as: ZANAFLEX Take 1 tablet (4 mg total) by mouth every 8 (eight) hours as needed for muscle spasms.   Trelegy Ellipta 100-62.5-25 MCG/ACT Aepb Generic drug: Fluticasone -Umeclidin-Vilant Inhale 1 puff into the  lungs every morning.        Diagnostic Studies: DG Shoulder Right Port Result Date: 06/17/2024 CLINICAL DATA:  Status post reverse shoulder arthroplasty. EXAM: RIGHT SHOULDER - 1 VIEW COMPARISON:  None Available. FINDINGS: Reverse right shoulder arthroplasty in expected alignment. No periprosthetic lucency or fracture. Recent postsurgical change includes air and edema in the joint space and soft tissues. Overlying skin staples in place. IMPRESSION: Reverse right shoulder arthroplasty without immediate postoperative complication. Electronically Signed   By: Andrea Gasman M.D.   On: 06/17/2024 13:37    Disposition:      Follow-up Information     Poggi, Norleen PARAS, MD Follow up in 2 week(s).   Specialty: Orthopedic Surgery Contact information: [address] [facility] [place] [address] [phone]                  Signed: Debby JAYSON Amber 06/21/2024, 10:18 AM

## 2024-06-21 NOTE — Discharge Instructions (Signed)
 [facility]  Phone: [phone]  Fax: [phone]   Discharge Instructions after Reverse Shoulder Replacement    1. Activity/Sling: You are to be non-weight bearing on operative extremity. A sling/shoulder immobilizer has been provided for you. Only remove the sling to perform elbow, wrist, and hand RoM exercises and hygiene/dressing. Active reaching and lifting are not permitted. You will be given further instructions on sling use at your first physical therapy visit and postoperative visit with Dr. Tobie.   2. Dressings: Dressing may be removed at 1st physical therapy visit (~3-4 days after surgery). Afterwards, you may either leave open to air (if no drainage) or cover with dry, sterile dressing. If you have steri-strips on your wound, please do not remove them. They will fall off on their own. You may shower 5 days after surgery. Please pat incision dry. Do not rub or place any shear forces across incision. If there is drainage or any opening of incision after 5 days, please notify our offices immediately.    3. Driving:  Plan on not driving for six weeks. Please note that you are advised NOT to drive while taking narcotic pain medications as you may be impaired and unsafe to drive.   4. Medications:  - You have been provided a prescription for narcotic pain medicine (usually oxycodone ). After surgery, take 1-2 narcotic tablets every 4 hours if needed for severe pain. Please start this as soon as you begin to start having pain (if you received a nerve block, start taking as soon as this wears off).  - A prescription for anti-nausea medication will be provided in case the narcotic medicine causes nausea - take 1 tablet every 6 hours only if nauseated.  -Take tylenol  1000mg  (2 Extra strength or 3 regular strength tablets) every 8 hours for pain. This will reduce the amount of narcotic medication needed. May stop tylenol  when you are having minimal pain. - Take a stool softener (Colace,  Dulcolax or Senakot) if you are using narcotic pain medications to help with constipation that is associated with narcotic use. - DO NOT take ANY nonsteroidal anti-inflammatory pain medications: Advil , Motrin , Ibuprofen , Aleve, Naproxen, or Naprosyn.   If you are taking prescription medication for anxiety, depression, insomnia, muscle spasm, chronic pain, or for attention deficit disorder you are advised that you are at a higher risk of adverse effects with use of narcotics post-op, including narcotic addiction/dependence, depressed breathing, death. If you use non-prescribed substances: alcohol , marijuana, cocaine, heroin, methamphetamines, etc., you are at a higher risk of adverse effects with use of narcotics post-op, including narcotic addiction/dependence, depressed breathing, death. You are advised that taking > 50 morphine  milligram equivalents (MME) of narcotic pain medication per day results in twice the risk of overdose or death. For your prescription provided: oxycodone  5 mg - taking more than 6 tablets per day after the first few days of surgery.   5. Physical Therapy: 1-2 times per week for ~12 weeks. Therapy typically starts on post operative Day 3 or 4. You have been provided an order for physical therapy. The therapist will provide home exercises. Please contact our offices if this appointment has not been scheduled.    6. Work: May do light duty/desk job in approximately 2 weeks when off of narcotics, pain is well-controlled, and swelling has decreased if able to function with one arm in sling. Full work may take 6 weeks if light motions and function of both arms is required. Lifting jobs may require 12 weeks.  7. Post-Op Appointments: Your first post-op appointment will be with Dr. Tobie in approximately 2 weeks time.    If you find that they have not been scheduled please call the Orthopaedic Appointment front desk at  [phone].                                [facility] Phone: [phone] Fax: [phone]   REVERSE SHOULDER ARTHROPLASTY REHAB GUIDELINES   These guidelines should be tailored to individual patients based on their rehab goals, age, precautions, quality of repair, etc.  Progression should be based on patient progress and approval by the referring physician.  PHASE 1 - Day 1 through Week 2  GENERAL GUIDELINES AND PRECAUTIONS Sling wear 24/7 except during grooming and home exercises (3 to 5 times daily) Avoid shoulder extension such that the arm is posterior the frontal plane.  When patients recline, a pillow should be placed behind the upper arm and sling should be on.  They should be advised to always be able to see the elbow Avoid combined IR/ADD/EXT, such as hand behind back to prevent dislocation Avoid combined IR and ADD such as reaching across the chest to prevent dislocation No AROM No submersion in pool/water  for 4 weeks No weight bearing through operative arm (as in transfers, walker use, etc.)  GOALS Maintain integrity of joint replacement; protect soft tissue healing Increase PROM for elevation to 120 and ER to 30 (will remain the goal for first 6 weeks) Optimize distal UE circulation and muscle activity (elbow, wrist and hand) Instruct in use of sling for proper fit, polar care device for ice application after HEP, signs/symptoms of infection  EXERCISES Active elbow, wrist and hand Passive forward elevation in scapular plane to 90-120 max motion; ER in scapular plane to 30 Active scapular retraction with arms resting in neutral position  CRITERIA TO PROGRESS TO PHASE 2 Low pain (less than 3/10) with shoulder PROM Healing of incision without signs of infection Clearance by MD to advance after 2 week MD check up  PHASE 2 -  2 weeks - 6 weeks  GENERAL GUIDELINES AND PRECAUTIONS Sling may be removed while at home; worn in  community without abduction pillow May use arm for light activities of daily living (such as feeding, brushing teeth, dressing.) with elbow near  the side of the body  and arm in front of the body- no active lifting of the arm May submerge in water  (tub, pool, Jacuzzi, etc.) after 4 weeks Continue to avoid WBing through the operative arm Continue to avoid combined IR/EXT/ADD (hand behind the back) and IR/ADD  (reaching across chest) for dislocation precautions  GOALS  Achieve passive elevation to 120 and ER to 30  Low (less than 3/10) to no pain  Ability to fire all heads of the deltoid  EXERCISES May discontinue grip, and active elbow and wrist exercises since using the arm in ADL's  with sling removed around the home Continue passive elevation to 120 and ER to 30, both in scapular plane with arm supported on table top Add submaximal isometrics, pain free effort, for all functional heads of deltoid (anterior, posterior, middle)  Ensure that with posterior deltoid isometric the shoulder does not move into extension and the arm remains anterior the frontal plane At 4 weeks:  begin to place arm in balanced position of 90 deg elevation in supine; when patient able to hold this position with ease, may begin  reverse pendulums clockwise and counterclockwise  CRITERIA TO PROGRESS TO PHASE 3 Passive forward elevation in scapular plane to 120; passive ER in scapular plane to 30 Ability to fire isometrically all heads of the deltoid muscle without pain Ability to place and hold the arm in balanced position (90 deg elevation in supine)  PHASE 3 - 6 weeks to 3 months  GENERAL GUIDELINES AND PRECAUTIONS Discontinue use of sling Avoid forcing end range motion in any direction to prevent dislocation  May advance use of the arm actively in ADL's without being restricted to arm by the side of the body, however, avoid heavy lifting and sports (forever!) May initiate functional IR behind the back gently NO  UPPER BODY ERGOMETER   GOALS Optimize PROM for elevation and ER in scapular plane with realistic expectation that max  mobility for elevation is usually around 145-160 passively; ER 40 to 50 passively; functional IR to L1 Recover AROM to approach as close to PROM available as possible; may expect 135-150 deg active elevation; 30 deg active ER; active functional IR to L1 Establish dynamic stability of the shoulder with deltoid and periscapular muscle gradual strengthening  EXERCISES Forward elevation in scapular plane active progression: supine to incline, to vertical; short to long lever arm Balanced position long lever arm AROM Active ER/IR with arm at side Scapular retraction with light band resistance Functional IR with hand slide up back - very gentle and gradual NO UPPER BODY ERGOMETER     CRITERIA TO PROGRESS TO PHASE 4  AROM equals/approaches PROM with good mechanics for elevation   No pain  Higher level demand on shoulder than ADL functions   PHASE 4 12 months and beyond  GENERAL GUIDELINES AND PRECAUTIONS No heavy lifting and no overhead sports No heavy pushing activity Gradually increase strength of deltoid and scapular stabilizers; also the rotator cuff if present with weights not to exceed 5 lbs NO UPPER BODY ERGOMETER   GOALS  Optimize functional use of the operative UE to meet the desired demands  Gradual increase in deltoid, scapular muscle, and rotator cuff strength  Pain free functional activities   EXERCISES Add light hand weights for deltoid up to and not to exceed 3 lbs for anterior and posterior with long arm lift against gravity; elbow bent to 90 deg for abduction in scapular plane Theraband progression for extension to hip with scapular depression/retraction Theraband progression for serratus anterior punches in supine; avoid wall, incline or prone pressups for serratus anterior End range stretching gently without forceful overpressure in all planes  (elevation in scapular plane, ER in scapular plane, functional IR) with stretching done for life as part of a daily routine NO UPPER BODY ERGOMETER     CRITERIA FOR DISCHARGE FROM SKILLED PHYSICAL THERAPY  Pain free AROM for shoulder elevation (expect around 135-150)  Functional strength for all ADL's, work tasks, and hobbies approved by Careers adviser  Independence with home maintenance program   NOTES: 1. With proper exercise, motion, strength, and function continue to improve even after one year. 2. The complication rate after surgery is 5 - 8%. Complications include infection, fracture, heterotopic bone formation, nerve injury, instability, rotator cuff tear, and tuberosity nonunion. Please look for clinical signs, unusual symptoms, or lack of progress with therapy and report those to Dr. Edie. Prefer more communication than less.  3. The therapy plan above only serves as a guide. Please be aware of specific individualized patient instructions as written on the prescription or through discussions with the  Careers adviser.

## 2024-06-21 NOTE — Plan of Care (Signed)

## 2024-06-21 NOTE — Progress Notes (Signed)
 Subjective: 4 Days Post-Op Procedure(s) (LRB): ARTHROPLASTY, SHOULDER, TOTAL, REVERSE (Right) Patient reports pain as mild this am. Pain well controlled with oxycodone . Patient is well, and has had no acute complaints or problems Denies any CP, SOB, N/V, fevers or chills Does state he has had dry eyes + BM this am Plan is to go to SNF after hospital stay.  Objective: Vital signs in last 24 hours: Temp:  [97.9 F (36.6 C)-98.7 F (37.1 C)] 98.7 F (37.1 C) (10/20 0832) Pulse Rate:  [69-105] 105 (10/20 0832) Resp:  [16-18] 18 (10/20 0832) BP: (73-120)/(57-87) 102/75 (10/20 0832) SpO2:  [97 %-100 %] 97 % (10/20 0832)  Intake/Output from previous day:  Intake/Output Summary (Last 24 hours) at 06/21/2024 1012 Last data filed at 06/20/2024 1800 Gross per 24 hour  Intake 982.36 ml  Output --  Net 982.36 ml    Intake/Output this shift: No intake/output data recorded.  Labs: Recent Labs    06/20/24 1420  HGB 10.1*   Recent Labs    06/20/24 1420  WBC 10.0  RBC 3.34*  HCT 31.4*  PLT 201   Recent Labs    06/20/24 1420  NA 137  K 4.6  CL 96*  CO2 31  BUN 17  CREATININE 0.81  GLUCOSE 259*  CALCIUM 8.1*   No results for input(s): LABPT, INR in the last 72 hours.  EXAM General - Patient is Alert, Appropriate, and Oriented RU Extremity - Neurologically intact Neurovascular intact Sensation intact distally Intact pulses distally No cellulitis present Compartment soft Able to flex and extend fingers without issue Dressing - dressing C/D/I and no drainage Motor Function - intact, moving digits, wrist well on exam  Past Medical History:  Diagnosis Date   Anticoagulated on apixaban     Aortic atherosclerosis    Asthma    COPD (chronic obstructive pulmonary disease) (HCC)    DM (diabetes mellitus), type 2 (HCC)    Dyspnea    GERD (gastroesophageal reflux disease)    HLD (hyperlipidemia)    Hoarseness of voice    a.) following extended intubation  (09/13/2023 - 01/27-2025) for RSV PNA   Hx of migraines    Normochromic anemia    Oxygen dependent (3L/[place])    Pneumonia 2023   Pneumonia due to respiratory syncytial virus (RSV) 09/2023   a.) intubated for mechanical ventilation form 09/13/2023 - 09/29/2023 (extubated to BiPAP)   Postoperative deep vein thrombosis (DVT) 02/26/2023   a.) proximal RIGHT femoral; b.) developed postoperatively (POD # 8) following shoulder arthroscopy for rotator cuff repair   Protein calorie malnutrition    Pulmonary nodule    Rotator cuff tear, right    Rotator cuff tendonitis, right    S/P percutaneous endoscopic gastrostomy (PEG) tube placement (HCC)    S/P right rotator cuff repair    Seizures (HCC)    as a teenager   Tobacco abuse     Assessment/Plan: 4 Days Post-Op Procedure(s) (LRB): ARTHROPLASTY, SHOULDER, TOTAL, REVERSE (Right) Principal Problem:   Status post reverse arthroplasty of shoulder, right  Estimated body mass index is 21.38 kg/m as calculated from the following:   Height as of this encounter: 6' (1.829 m).   Weight as of this encounter: 71.5 kg. Continue with PT VSS  Pain well controlled CM to assist with discharge to SNF Patient will follow-up with [facility] orthopedics in 2 weeks for staple removal and reevaluation   DVT Prophylaxis: Eliquis  and SCDs   T. Medford Amber, PA-C [facility] 06/21/2024, 10:12  AM

## 2024-06-21 NOTE — Plan of Care (Signed)
   Problem: Fluid Volume: Goal: Ability to maintain a balanced intake and output will improve Outcome: Progressing   Problem: Health Behavior/Discharge Planning: Goal: Ability to identify and utilize available resources and services will improve Outcome: Progressing Goal: Ability to manage health-related needs will improve Outcome: Progressing

## 2024-06-22 LAB — GLUCOSE, CAPILLARY
Glucose-Capillary: 220 mg/dL — ABNORMAL HIGH (ref 70–99)
Glucose-Capillary: 199 mg/dL — ABNORMAL HIGH (ref 70–99)
Glucose-Capillary: 165 mg/dL — ABNORMAL HIGH (ref 70–99)
Glucose-Capillary: 165 mg/dL — ABNORMAL HIGH (ref 70–99)
Glucose-Capillary: 122 mg/dL — ABNORMAL HIGH (ref 70–99)

## 2024-06-22 NOTE — TOC Progression Note (Addendum)
 Transition of Care [facility]) - Progression Note    Patient Details  Name: Benjamin Bolton MRN: [id number] Date of Birth: 1960-09-10  Transition of Care [facility]) CM/SW Contact  Alvaro Louder, [place] Phone Number: 06/22/2024, 10:41 AM  Clinical Narrative:   4:13 PM: Auth approved for patient to admit to [facility] tomorrow: Approved PlanAuthID:[id number] Dates: 10/21-10/23/2025 Next Review Date: 06/24/2024     Patient selected SNF [facility]. Pending [id number].   TOC to follow for discharge    Expected Discharge Plan: Skilled Nursing Facility Barriers to Discharge: Continued Medical Work up               Expected Discharge Plan and Services     Post Acute Care Choice: Skilled Nursing Facility Living arrangements for the past 2 months: Single Family Home                                       Social Drivers of Health (SDOH) Interventions SDOH Screenings   Food Insecurity: No Food Insecurity (06/17/2024)  Recent Concern: Food Insecurity - Food Insecurity Present (04/15/2024)   Received from [facility]  Housing: Low Risk  (06/17/2024)  Transportation Needs: Unmet Transportation Needs (06/17/2024)  Utilities: Not At Risk (06/17/2024)  Recent Concern: Utilities - At Risk (04/15/2024)   Received from [facility]  Financial Resource Strain: Low Risk  (05/04/2024)   Received from [facility]  Recent Concern: Financial Resource Strain - High Risk (04/15/2024)   Received from [facility]  Physical Activity: Inactive (04/15/2024)   Received from [facility]  Social Connections: Socially Isolated (04/15/2024)   Received from [facility]  Stress: No Stress Concern Present (04/15/2024)   Received from [facility]  Tobacco Use: High Risk (06/17/2024)  Health Literacy: Inadequate Health Literacy (04/15/2024)   Received from [facility]    Readmission Risk Interventions     No data to display

## 2024-06-22 NOTE — Progress Notes (Signed)
 Physical Therapy Treatment Patient Details Name: Benjamin Bolton MRN: [id number] DOB: 1960-10-17 Today's Date: 06/22/2024   History of Present Illness Pt is a 63 year old male s/p R Reverse right total shoulder arthroplasty.    PMH significant for  severe COPD and asthma diagnosis.    PT Comments  Pt ready for session - up with OT.  Takes short rest in recliner before gait but is able to complete x 1 lap.  Gait with x 1 imbalance that needed min a x 1 to correct today then opt to occasionally use rail in hallway for balance.  Again encouraged trial of cane but he remains resistant as he is concerned he will not be able to manage his O2 tank and cording with R arm in sling which is valid.  But he is encouraged to consider it while he is here/rehab while he recovers.  He remains in chair after session with needs met.     If plan is discharge home, recommend the following: A little help with walking and/or transfers;A lot of help with bathing/dressing/bathroom;Assistance with cooking/housework;Assist for transportation;Help with stairs or ramp for entrance   Can travel by private vehicle        Equipment Recommendations       Recommendations for Other Services       Precautions / Restrictions Precautions Precautions: Fall Recall of Precautions/Restrictions: Intact Required Braces or Orthoses: Sling Restrictions Weight Bearing Restrictions Per Provider Order: Yes RUE Weight Bearing Per Provider Order: Non weight bearing     Mobility  Bed Mobility               General bed mobility comments: up with OT upon arrival    Transfers Overall transfer level: Needs assistance Equipment used: None Transfers: Sit to/from Stand Sit to Stand: Supervision                Ambulation/Gait Ambulation/Gait assistance: Contact guard assist, Min assist Gait Distance (Feet): 160 Feet Assistive device: None (occaionally uses rail in hallway especially at end when more  fatigued) Gait Pattern/deviations: Step-through pattern, Decreased step length - right, Decreased step length - left, Drifts right/left Gait velocity: decreased     General Gait Details: on eLOB required min a x 1 to correct.  continued to encourage trial of cane but he is resistant   Stairs             Wheelchair Mobility     Tilt Bed    Modified Rankin (Stroke Patients Only)       Balance Overall balance assessment: Needs assistance Sitting-balance support: Feet supported Sitting balance-Leahy Scale: Good     Standing balance support: No upper extremity supported Standing balance-Leahy Scale: Fair Standing balance comment: mild increased postural sway in standing                            Communication Communication Communication: No apparent difficulties  Cognition Arousal: Alert Behavior During Therapy: WFL for tasks assessed/performed   PT - Cognitive impairments: No apparent impairments                       PT - Cognition Comments: A&Ox4, pleasant Following commands: Intact      Cueing Cueing Techniques: Verbal cues, Visual cues  Exercises      General Comments General comments (skin integrity, edema, etc.): spo2 >90% on 2 L via [place] throughout      Pertinent  Vitals/Pain Pain Assessment Pain Assessment: Faces Faces Pain Scale: Hurts a little bit Pain Location: R shoulder Pain Descriptors / Indicators: Discomfort, Sore Pain Intervention(s): Limited activity within patient's tolerance, Monitored during session, Repositioned, Ice applied    Home Living                          Prior Function            PT Goals (current goals can now be found in the care plan section) Progress towards PT goals: Progressing toward goals    Frequency    BID      PT Plan      Co-evaluation              AM-PAC PT 6 Clicks Mobility   Outcome Measure  Help needed turning from your back to your side while in a  flat bed without using bedrails?: None Help needed moving from lying on your back to sitting on the side of a flat bed without using bedrails?: None Help needed moving to and from a bed to a chair (including a wheelchair)?: A Little Help needed standing up from a chair using your arms (e.g., wheelchair or bedside chair)?: A Little Help needed to walk in hospital room?: A Little Help needed climbing 3-5 steps with a railing? : A Little 6 Click Score: 20    End of Session Equipment Utilized During Treatment: Gait belt;Oxygen Activity Tolerance: Patient tolerated treatment well;Patient limited by fatigue Patient left: in chair;with chair alarm set;with call bell/phone within reach Nurse Communication: Mobility status PT Visit Diagnosis: Unsteadiness on feet (R26.81);Other abnormalities of gait and mobility (R26.89);Muscle weakness (generalized) (M62.81)     Time: 9057-9041 PT Time Calculation (min) (ACUTE ONLY): 16 min  Charges:    $Gait Training: 8-22 mins PT General Charges $$ ACUTE PT VISIT: 1 Visit                   Lauraine Gills, PTA 06/22/24, 10:20 AM

## 2024-06-22 NOTE — Plan of Care (Signed)

## 2024-06-22 NOTE — Progress Notes (Signed)
 Subjective: 5 Days Post-Op Procedure(s) (LRB): ARTHROPLASTY, SHOULDER, TOTAL, REVERSE (Right) Patient reports pain as mild this am.  Patient is well, and has had no acute complaints or problems Denies any CP, SOB, N/V, fevers or chills Does state he has had dry eyes + BM Plan is to go to SNF after hospital stay.  Objective: Vital signs in last 24 hours: Temp:  [98.1 F (36.7 C)-98.7 F (37.1 C)] 98.2 F (36.8 C) (10/21 0805) Pulse Rate:  [91-105] 100 (10/21 0805) Resp:  [14-18] 14 (10/21 0805) BP: (96-130)/(74-103) 130/103 (10/21 0805) SpO2:  [96 %-99 %] 99 % (10/21 0805)  Intake/Output from previous day:  Intake/Output Summary (Last 24 hours) at 06/22/2024 0823 Last data filed at 06/22/2024 0030 Gross per 24 hour  Intake 240 ml  Output 375 ml  Net -135 ml    Intake/Output this shift: No intake/output data recorded.  Labs: Recent Labs    06/20/24 1420  HGB 10.1*   Recent Labs    06/20/24 1420  WBC 10.0  RBC 3.34*  HCT 31.4*  PLT 201   Recent Labs    06/20/24 1420  NA 137  K 4.6  CL 96*  CO2 31  BUN 17  CREATININE 0.81  GLUCOSE 259*  CALCIUM 8.1*   No results for input(s): LABPT, INR in the last 72 hours.  EXAM General - Patient is Alert, Appropriate, and Oriented RU Extremity - Neurologically intact Neurovascular intact Sensation intact distally Intact pulses distally No cellulitis present Compartment soft Able to flex and extend fingers without issue Dressing - dressing C/D/I and no drainage Motor Function - intact, moving digits, wrist well on exam  Past Medical History:  Diagnosis Date   Anticoagulated on apixaban     Aortic atherosclerosis    Asthma    COPD (chronic obstructive pulmonary disease) (HCC)    DM (diabetes mellitus), type 2 (HCC)    Dyspnea    GERD (gastroesophageal reflux disease)    HLD (hyperlipidemia)    Hoarseness of voice    a.) following extended intubation (09/13/2023 - 01/27-2025) for RSV PNA   Hx of  migraines    Normochromic anemia    Oxygen dependent (3L/[place])    Pneumonia 2023   Pneumonia due to respiratory syncytial virus (RSV) 09/2023   a.) intubated for mechanical ventilation form 09/13/2023 - 09/29/2023 (extubated to BiPAP)   Postoperative deep vein thrombosis (DVT) 02/26/2023   a.) proximal RIGHT femoral; b.) developed postoperatively (POD # 8) following shoulder arthroscopy for rotator cuff repair   Protein calorie malnutrition    Pulmonary nodule    Rotator cuff tear, right    Rotator cuff tendonitis, right    S/P percutaneous endoscopic gastrostomy (PEG) tube placement (HCC)    S/P right rotator cuff repair    Seizures (HCC)    as a teenager   Tobacco abuse     Assessment/Plan: 5 Days Post-Op Procedure(s) (LRB): ARTHROPLASTY, SHOULDER, TOTAL, REVERSE (Right) Principal Problem:   Status post reverse arthroplasty of shoulder, right  Estimated body mass index is 21.38 kg/m as calculated from the following:   Height as of this encounter: 6' (1.829 m).   Weight as of this encounter: 71.5 kg. Continue with PT VSS  Pain well controlled CM to assist with discharge to SNF Patient will follow-up with [facility] orthopedics in 2 weeks for staple removal and reevaluation   DVT Prophylaxis: Eliquis  and SCDs   T. Medford Amber, PA-C [facility] 06/22/2024, 8:23 AM

## 2024-06-22 NOTE — Progress Notes (Signed)
 Occupational Therapy Treatment Patient Details Name: Benjamin Bolton MRN: [id number] DOB: November 16, 1960 Today's Date: 06/22/2024   History of present illness Pt is a 63 year old male s/p R Reverse right total shoulder arthroplasty.    PMH significant for  severe COPD and asthma diagnosis.   OT comments  Chart reviewed to date, pt greeted semi supine in bed, agreeable to OT tx session targeting improving functional activity tolerance and ADL performance. Pt is making progress towards goals, he is able to doff sling with MOD I, MIN A for doffing/donning polar care and donning sling. He is able to self direct care. MOD I for LB dressing in bed. He requires supervision for bed mobility and amb in room approx 20' with no AD. He declines use of SPC. Discussed safer ADL completion with AE/DME. Pt is left in chair, all needs met. OT will continue to follow.       If plan is discharge home, recommend the following:  A little help with walking and/or transfers;A lot of help with bathing/dressing/bathroom   Equipment Recommendations  Tub/shower seat    Recommendations for Other Services      Precautions / Restrictions Precautions Precautions: Fall Recall of Precautions/Restrictions: Intact Required Braces or Orthoses: Sling Restrictions Weight Bearing Restrictions Per Provider Order: Yes RUE Weight Bearing Per Provider Order: Non weight bearing       Mobility Bed Mobility Overal bed mobility: Modified Independent                  Transfers Overall transfer level: Needs assistance Equipment used: None Transfers: Sit to/from Stand Sit to Stand: Supervision                 Balance Overall balance assessment: Needs assistance Sitting-balance support: Feet supported Sitting balance-Leahy Scale: Good     Standing balance support: No upper extremity supported Standing balance-Leahy Scale: Fair                             ADL either performed or assessed  with clinical judgement   ADL Overall ADL's : Needs assistance/impaired Eating/Feeding: Set up;Sitting               Upper Body Dressing : Minimal assistance;Sitting Upper Body Dressing Details (indicate cue type and reason): MOD I to doff sling, MIN A to donn polar care and sling (especially reaching behind his back with LUE) Lower Body Dressing: Modified independent;Bed level Lower Body Dressing Details (indicate cue type and reason): donn socks Toilet Transfer: Supervision/safety;Ambulation Toilet Transfer Details (indicate cue type and reason): simulated         Functional mobility during ADLs: Supervision/safety (approx 20' in room with no AD, pt declined use of SPC)      Extremity/Trunk Assessment Upper Extremity Assessment RUE Deficits / Details: elbow/wrist/hand PROM grossly [facility]            Vision Patient Visual Report: No change from baseline     Perception     Praxis     Communication Communication Communication: No apparent difficulties   Cognition Arousal: Alert Behavior During Therapy: WFL for tasks assessed/performed Cognition: No apparent impairments                               Following commands: Intact        Cueing   Cueing Techniques: Verbal cues, Visual cues  Exercises Shoulder  Exercises Elbow Flexion: AAROM, 10 reps, Right Elbow Extension: AAROM, 10 reps, Right Wrist Flexion: AROM, Right, 10 reps Wrist Extension: 10 reps, AROM Hand Exercises Forearm Supination: AAROM, Right, 10 reps Forearm Pronation: AAROM, 10 reps, Right Other Exercises Other Exercises: edu re discharge plan and safe ADL completion at home    Shoulder Instructions       General Comments spo2 >90% on 2 L via [place] throughout    Pertinent Vitals/ Pain       Pain Assessment Pain Assessment: 0-10 Pain Score: 9  Pain Location: R shoulder Pain Descriptors / Indicators: Discomfort Pain Intervention(s): Monitored during session, Repositioned, Ice  applied (notified nurse)  Home Living                                          Prior Functioning/Environment              Frequency  Min 3X/week        Progress Toward Goals  OT Goals(current goals can now be found in the care plan section)  Progress towards OT goals: Progressing toward goals  Acute Rehab OT Goals Time For Goal Achievement: 07/01/24  Plan      Co-evaluation                 AM-PAC OT 6 Clicks Daily Activity     Outcome Measure   Help from another person eating meals?: None Help from another person taking care of personal grooming?: None Help from another person toileting, which includes using toliet, bedpan, or urinal?: A Little Help from another person bathing (including washing, rinsing, drying)?: A Lot Help from another person to put on and taking off regular upper body clothing?: A Little Help from another person to put on and taking off regular lower body clothing?: None 6 Click Score: 20    End of Session Equipment Utilized During Treatment: Oxygen  OT Visit Diagnosis: Other abnormalities of gait and mobility (R26.89)   Activity Tolerance Patient tolerated treatment well   Patient Left in chair;with call bell/phone within reach (hand off to PT)   Nurse Communication Mobility status (pain)        Time: 9074-9054 OT Time Calculation (min): 20 min  Charges: OT General Charges $OT Visit: 1 Visit OT Treatments $Self Care/Home Management : 8-22 mins Therisa Sheffield, OTD OTR/L  06/22/24, 9:57 AM

## 2024-06-23 LAB — GLUCOSE, CAPILLARY: Glucose-Capillary: 169 mg/dL — ABNORMAL HIGH (ref 70–99)

## 2024-06-23 NOTE — Discharge Planning (Signed)
 Attempted to call report at [phone] RM: E-4  This RN was transferred to admission with no answer x2, left message with phone number to call back. IV removed DC package given to transport including his signed RX.  No other concerns at this time.

## 2024-06-23 NOTE — Care Plan (Signed)
 Called back at 10:49, report given to [facility]. All questions answered

## 2024-06-23 NOTE — TOC Transition Note (Signed)
 Transition of Care [facility]) - Discharge Note   Patient Details  Name: Benjamin Bolton MRN: [id number] Date of Birth: 1961/03/25  Transition of Care [facility]) CM/SW Contact:  Alvaro Louder, LCSW Phone Number: 06/23/2024, 11:05 AM   Clinical Narrative:   LCSWA received insurance approval for patient to admit to SNF [facility]. LCSWA confirmed with MD that patient is stable for discharge. LCSWA notified the patient and they are in agreement with discharge. LCSWA confirmed bed is available at SNF. Transport arranged with [facility] for next available.  Number to call report: [phone] RM: E-4   TOC signing off    Final next level of care: Skilled Nursing Facility Barriers to Discharge: No Barriers Identified   Patient Goals and CMS Choice            Discharge Placement              Patient chooses bed at:  ([facility]) Patient to be transferred to facility by: [facility] Name of family member notified: Self Patient and family notified of of transfer: 06/23/24  Discharge Plan and Services Additional resources added to the After Visit Summary for       Post Acute Care Choice: Skilled Nursing Facility                               Social Drivers of Health (SDOH) Interventions SDOH Screenings   Food Insecurity: No Food Insecurity (06/17/2024)  Recent Concern: Food Insecurity - Food Insecurity Present (04/15/2024)   Received from [facility]  Housing: Low Risk  (06/17/2024)  Transportation Needs: Unmet Transportation Needs (06/17/2024)  Utilities: Not At Risk (06/17/2024)  Recent Concern: Utilities - At Risk (04/15/2024)   Received from [facility]  Financial Resource Strain: Low Risk  (05/04/2024)   Received from [facility]  Recent Concern: Financial Resource Strain - High Risk (04/15/2024)   [facility]  Physical Activity: Inactive (04/15/2024)   Received from [facility]  Social Connections: Socially Isolated (04/15/2024)   Received from [facility]  Stress: No Stress Concern Present (04/15/2024)   Received from [facility]  Tobacco Use: High Risk (06/17/2024)  Health Literacy: Inadequate Health Literacy (04/15/2024)   Received from [facility]     Readmission Risk Interventions     No data to display

## 2024-06-23 NOTE — Plan of Care (Addendum)
 Patient is cooperative. Alert and oriented x4. Diminished lung sounds, on 3L nasal cannula which is patient's baseline, no noted cough. Abdomen soft, last bowel movement 10/20, +bowel sounds, on bowel regimen as well. Voiding without any difficulty. Able to feel sensation, patient able to wiggle fingers, denies numbness or tingling. PRN pain medication given with good affect in reduction of pain. PRN stool softener given r/t patient stated I am feeling bloated and a bit constipated this morning. Tolerating diet, ACHS glucose checks. Ambulating 1 assist with front wheel walker out of bed, shoulder immobilizer in place along with polar care. Dressing has old drainage, honeycomb dressing. Hourly rounding performed, fall precautions maintained, and call bell within reach.   Problem: Education: Goal: Ability to describe self-care measures that may prevent or decrease complications (Diabetes Survival Skills Education) will improve Outcome: Progressing Goal: Individualized Educational Video(s) Outcome: Progressing   Problem: Coping: Goal: Ability to adjust to condition or change in health will improve Outcome: Progressing   Problem: Fluid Volume: Goal: Ability to maintain a balanced intake and output will improve Outcome: Progressing   Problem: Health Behavior/Discharge Planning: Goal: Ability to identify and utilize available resources and services will improve Outcome: Progressing Goal: Ability to manage health-related needs will improve Outcome: Progressing   Problem: Metabolic: Goal: Ability to maintain appropriate glucose levels will improve Outcome: Progressing   Problem: Nutritional: Goal: Maintenance of adequate nutrition will improve Outcome: Progressing Goal: Progress toward achieving an optimal weight will improve Outcome: Progressing   Problem: Skin Integrity: Goal: Risk for impaired skin integrity will decrease Outcome: Progressing   Problem: Tissue Perfusion: Goal:  Adequacy of tissue perfusion will improve Outcome: Progressing   Problem: Education: Goal: Knowledge of General Education information will improve Description: Including pain rating scale, medication(s)/side effects and non-pharmacologic comfort measures Outcome: Progressing   Problem: Health Behavior/Discharge Planning: Goal: Ability to manage health-related needs will improve Outcome: Progressing   Problem: Clinical Measurements: Goal: Ability to maintain clinical measurements within normal limits will improve Outcome: Progressing Goal: Will remain free from infection Outcome: Progressing Goal: Diagnostic test results will improve Outcome: Progressing Goal: Respiratory complications will improve Outcome: Progressing Goal: Cardiovascular complication will be avoided Outcome: Progressing   Problem: Activity: Goal: Risk for activity intolerance will decrease Outcome: Progressing   Problem: Nutrition: Goal: Adequate nutrition will be maintained Outcome: Progressing   Problem: Coping: Goal: Level of anxiety will decrease Outcome: Progressing   Problem: Elimination: Goal: Will not experience complications related to bowel motility Outcome: Progressing Goal: Will not experience complications related to urinary retention Outcome: Progressing   Problem: Pain Managment: Goal: General experience of comfort will improve and/or be controlled Outcome: Progressing   Problem: Safety: Goal: Ability to remain free from injury will improve Outcome: Progressing   Problem: Skin Integrity: Goal: Risk for impaired skin integrity will decrease Outcome: Progressing

## 2024-06-23 NOTE — Progress Notes (Signed)
 Physical Therapy Treatment Patient Details Name: Benjamin Bolton MRN: [id number] DOB: 1960-12-31 Today's Date: 06/23/2024   History of Present Illness Pt is a 63 year old male s/p R Reverse right total shoulder arthroplasty.    PMH significant for  severe COPD and asthma diagnosis.    PT Comments  Pt was long sitting in bed upon arrival. He is A and O x 4. Endorses 6/10 pain but was premedicated. Knew about NWB restrictions for RUE. Pt O2 increased to 4L when OOB.  I usually have it on 4L when I'm up and moving at home.  Sao2 > 88% on 4 L during OOB activity. Pt does present with some unsteadiness without UE support during ambulation. Pt does not have available assistance at DC and will benefit from continues skilled PT to maximize his independence and safety with all ADLs. Dc recs remain appropriate. Frequency changed to reflect pt's needs and progress. Pt was seated EOB post session with bed alarm set and call bell in reach.     If plan is discharge home, recommend the following: A little help with walking and/or transfers;A lot of help with bathing/dressing/bathroom;Assistance with cooking/housework;Assist for transportation;Help with stairs or ramp for entrance     Equipment Recommendations  Other (comment) (Defer to next level of care)       Precautions / Restrictions Precautions Precautions: Fall Recall of Precautions/Restrictions: Intact Restrictions Weight Bearing Restrictions Per Provider Order: Yes RUE Weight Bearing Per Provider Order: Non weight bearing     Mobility  Bed Mobility Overal bed mobility: Modified Independent   Transfers Overall transfer level: Needs assistance Equipment used: None Transfers: Sit to/from Stand Sit to Stand: Supervision   Ambulation/Gait Ambulation/Gait assistance: Contact guard assist, Supervision Gait Distance (Feet): 200 Feet Assistive device: None Gait Pattern/deviations: Step-through pattern, Decreased step length - right,  Decreased step length - left, Drifts right/left Gait velocity: decreased  General Gait Details: Pt copntinues to require therapist to manage O2 tank when ambulating wihtout AD. 4 L during gait with sao2 >88%    Balance Overall balance assessment: Needs assistance Sitting-balance support: Feet supported Sitting balance-Leahy Scale: Good     Standing balance support: During functional activity, No upper extremity supported Standing balance-Leahy Scale: Fair Standing balance comment: pt still is at risk of falls however no formal LOB noted      Communication Communication Communication: No apparent difficulties  Cognition Arousal: Alert Behavior During Therapy: WFL for tasks assessed/performed   PT - Cognitive impairments: No apparent impairments    PT - Cognition Comments: A&Ox4, pleasant Following commands: Intact      Cueing Cueing Techniques: Verbal cues         Pertinent Vitals/Pain Pain Assessment Pain Assessment: 0-10 Pain Score: 6  Pain Location: R shoulder Pain Descriptors / Indicators: Discomfort, Sore Pain Intervention(s): Limited activity within patient's tolerance, Monitored during session, Premedicated before session, Repositioned, Ice applied     PT Goals (current goals can now be found in the care plan section) Acute Rehab PT Goals Patient Stated Goal: to get better Progress towards PT goals: Progressing toward goals    Frequency    7X/week       AM-PAC PT 6 Clicks Mobility   Outcome Measure  Help needed turning from your back to your side while in a flat bed without using bedrails?: None Help needed moving from lying on your back to sitting on the side of a flat bed without using bedrails?: None Help needed moving to and from  a bed to a chair (including a wheelchair)?: A Little Help needed standing up from a chair using your arms (e.g., wheelchair or bedside chair)?: A Little Help needed to walk in hospital room?: A Little Help needed  climbing 3-5 steps with a railing? : A Little 6 Click Score: 20    End of Session Equipment Utilized During Treatment: Oxygen (3L atr rest, 4L during OOB activity) Activity Tolerance: Patient tolerated treatment well Patient left: Other (comment) (seated EOB with bed alarm in place) Nurse Communication: Mobility status PT Visit Diagnosis: Unsteadiness on feet (R26.81);Other abnormalities of gait and mobility (R26.89);Muscle weakness (generalized) (M62.81)     Time: 9077-9057 PT Time Calculation (min) (ACUTE ONLY): 20 min  Charges:    $Gait Training: 8-22 mins PT General Charges $$ ACUTE PT VISIT: 1 Visit                     Rankin Essex PTA 06/23/24, 9:56 AM

## 2024-06-23 NOTE — Progress Notes (Signed)
 Subjective: 6 Days Post-Op Procedure(s) (LRB): ARTHROPLASTY, SHOULDER, TOTAL, REVERSE (Right) Patient reports pain as mild this am.  Patient is well, and has had no acute complaints or problems Denies any CP, SOB, N/V, fevers or chills Plan is to go to SNF after hospital stay.  Objective: Vital signs in last 24 hours: Temp:  [98 F (36.7 C)-98.4 F (36.9 C)] 98.1 F (36.7 C) (10/22 0434) Pulse Rate:  [83-104] 83 (10/22 0434) Resp:  [16-18] 16 (10/22 0434) BP: (104-130)/(64-89) 114/75 (10/22 0434) SpO2:  [96 %-100 %] 100 % (10/22 0434)  Intake/Output from previous day:  Intake/Output Summary (Last 24 hours) at 06/23/2024 0808 Last data filed at 06/23/2024 0351 Gross per 24 hour  Intake 830 ml  Output 1050 ml  Net -220 ml    Intake/Output this shift: No intake/output data recorded.  Labs: Recent Labs    06/20/24 1420  HGB 10.1*   Recent Labs    06/20/24 1420  WBC 10.0  RBC 3.34*  HCT 31.4*  PLT 201   Recent Labs    06/20/24 1420  NA 137  K 4.6  CL 96*  CO2 31  BUN 17  CREATININE 0.81  GLUCOSE 259*  CALCIUM 8.1*   No results for input(s): LABPT, INR in the last 72 hours.  EXAM General - Patient is Alert, Appropriate, and Oriented RU Extremity - Neurologically intact Neurovascular intact Sensation intact distally Intact pulses distally No cellulitis present Compartment soft Able to flex and extend fingers without issue Dressing - dressing C/D/I and no drainage Motor Function - intact, moving digits, wrist well on exam  Past Medical History:  Diagnosis Date   Anticoagulated on apixaban     Aortic atherosclerosis    Asthma    COPD (chronic obstructive pulmonary disease) (HCC)    DM (diabetes mellitus), type 2 (HCC)    Dyspnea    GERD (gastroesophageal reflux disease)    HLD (hyperlipidemia)    Hoarseness of voice    a.) following extended intubation (09/13/2023 - 01/27-2025) for RSV PNA   Hx of migraines    Normochromic anemia     Oxygen dependent (3L/[place])    Pneumonia 2023   Pneumonia due to respiratory syncytial virus (RSV) 09/2023   a.) intubated for mechanical ventilation form 09/13/2023 - 09/29/2023 (extubated to BiPAP)   Postoperative deep vein thrombosis (DVT) 02/26/2023   a.) proximal RIGHT femoral; b.) developed postoperatively (POD # 8) following shoulder arthroscopy for rotator cuff repair   Protein calorie malnutrition    Pulmonary nodule    Rotator cuff tear, right    Rotator cuff tendonitis, right    S/P percutaneous endoscopic gastrostomy (PEG) tube placement (HCC)    S/P right rotator cuff repair    Seizures (HCC)    as a teenager   Tobacco abuse     Assessment/Plan: 6 Days Post-Op Procedure(s) (LRB): ARTHROPLASTY, SHOULDER, TOTAL, REVERSE (Right) Principal Problem:   Status post reverse arthroplasty of shoulder, right  Estimated body mass index is 21.38 kg/m as calculated from the following:   Height as of this encounter: 6' (1.829 m).   Weight as of this encounter: 71.5 kg. Continue with PT VSS  Pain well controlled CM to assist with discharge to SNF today Patient will follow-up with [facility] orthopedics in 2 weeks for staple removal and reevaluation   DVT Prophylaxis: Eliquis  and SCDs   T. Medford Amber, PA-C [facility] 06/23/2024, 8:08 AM

## 2024-07-05 ENCOUNTER — Encounter: Payer: Self-pay | Admitting: Radiology

## 2024-07-19 ENCOUNTER — Encounter: Payer: Self-pay | Admitting: Surgery

## 2024-08-17 ENCOUNTER — Ambulatory Visit: Admitting: Podiatry

## 2024-08-19 ENCOUNTER — Ambulatory Visit (INDEPENDENT_AMBULATORY_CARE_PROVIDER_SITE_OTHER): Admitting: Podiatry

## 2024-08-19 ENCOUNTER — Ambulatory Visit: Admitting: Podiatry

## 2024-08-19 ENCOUNTER — Encounter: Payer: Self-pay | Admitting: Podiatry

## 2024-08-19 VITALS — Ht 72.0 in | Wt 157.6 lb

## 2024-08-19 DIAGNOSIS — D689 Coagulation defect, unspecified: Secondary | ICD-10-CM

## 2024-08-19 DIAGNOSIS — M79674 Pain in right toe(s): Secondary | ICD-10-CM | POA: Diagnosis not present

## 2024-08-19 DIAGNOSIS — B351 Tinea unguium: Secondary | ICD-10-CM

## 2024-08-19 DIAGNOSIS — S32020A Wedge compression fracture of second lumbar vertebra, initial encounter for closed fracture: Secondary | ICD-10-CM

## 2024-08-19 DIAGNOSIS — M79675 Pain in left toe(s): Secondary | ICD-10-CM | POA: Diagnosis not present

## 2024-08-19 NOTE — Progress Notes (Signed)
This patient returns to my office for at risk foot care.  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  This patient is unable to cut nails himself since the patient cannot reach his nails.These nails are painful walking and wearing shoes.  This patient presents for at risk foot care today.  General Appearance  Alert, conversant and in no acute stress.  Vascular  Dorsalis pedis and posterior tibial  pulses are palpable  bilaterally.  Capillary return is within normal limits  bilaterally. Temperature is within normal limits  bilaterally.  Neurologic  Senn-Weinstein monofilament wire test within normal limits  bilaterally. Muscle power within normal limits bilaterally.  Nails Thick disfigured discolored nails with subungual debris  from hallux to fifth toes bilaterally. No evidence of bacterial infection or drainage bilaterally.  Orthopedic  No limitations of motion  feet .  No crepitus or effusions noted.  No bony pathology or digital deformities noted.  Skin  normotropic skin with no porokeratosis noted bilaterally.  No signs of infections or ulcers noted.     Onychomycosis  Pain in right toes  Pain in left toes  Consent was obtained for treatment procedures.   Mechanical debridement of nails 1-5  bilaterally performed with a nail nipper.  Filed with dremel without incident.    Return office visit    3 months                  Told patient to return for periodic foot care and evaluation due to potential at risk complications.   Rachid Parham DPM   

## 2024-08-20 ENCOUNTER — Ambulatory Visit
Admission: RE | Admit: 2024-08-20 | Discharge: 2024-08-20 | Disposition: A | Discharge status: Home / Self Care | Source: Ambulatory Visit | Attending: Family Medicine | Admitting: Family Medicine

## 2024-08-20 DIAGNOSIS — S32020A Wedge compression fracture of second lumbar vertebra, initial encounter for closed fracture: Secondary | ICD-10-CM | POA: Insufficient documentation

## 2024-08-25 ENCOUNTER — Ambulatory Visit: Admitting: Podiatry

## 2024-08-31 ENCOUNTER — Other Ambulatory Visit: Payer: Self-pay | Admitting: Family Medicine

## 2024-08-31 DIAGNOSIS — S32020A Wedge compression fracture of second lumbar vertebra, initial encounter for closed fracture: Secondary | ICD-10-CM

## 2024-09-06 ENCOUNTER — Encounter: Payer: Self-pay | Admitting: Interventional Radiology

## 2024-11-18 ENCOUNTER — Ambulatory Visit: Admitting: Podiatry
